# Patient Record
Sex: Male | Born: 1995
Health system: Southern US, Community
[De-identification: ages and names within clinical notes are randomized; demographics above are authoritative.]

## PROBLEM LIST (undated history)

## (undated) DIAGNOSIS — K4090 Unilateral inguinal hernia, without obstruction or gangrene, not specified as recurrent: Secondary | ICD-10-CM

## (undated) DIAGNOSIS — R45851 Suicidal ideations: Secondary | ICD-10-CM

## (undated) DIAGNOSIS — F209 Schizophrenia, unspecified: Secondary | ICD-10-CM

## (undated) DIAGNOSIS — F99 Mental disorder, not otherwise specified: Secondary | ICD-10-CM

## (undated) NOTE — *Deleted (*Deleted)
Pt is alert and oriented to person, place, time and situation. Pt is calm, cooperative, denies suicidal and homicidal ideation.

---
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
## 2017-04-09 ENCOUNTER — Encounter (HOSPITAL_COMMUNITY): Payer: Self-pay | Admitting: *Deleted

## 2017-04-09 DIAGNOSIS — F17200 Nicotine dependence, unspecified, uncomplicated: Secondary | ICD-10-CM | POA: Diagnosis not present

## 2017-04-09 DIAGNOSIS — R109 Unspecified abdominal pain: Secondary | ICD-10-CM | POA: Diagnosis present

## 2017-04-09 DIAGNOSIS — Z5181 Encounter for therapeutic drug level monitoring: Secondary | ICD-10-CM | POA: Insufficient documentation

## 2017-04-09 DIAGNOSIS — K4090 Unilateral inguinal hernia, without obstruction or gangrene, not specified as recurrent: Secondary | ICD-10-CM | POA: Diagnosis not present

## 2017-04-09 DIAGNOSIS — F1210 Cannabis abuse, uncomplicated: Secondary | ICD-10-CM | POA: Diagnosis not present

## 2017-04-09 LAB — COMPREHENSIVE METABOLIC PANEL
ALK PHOS: 73 U/L (ref 38–126)
ALT: 25 U/L (ref 17–63)
AST: 31 U/L (ref 15–41)
Albumin: 4.7 g/dL (ref 3.5–5.0)
Anion gap: 7 (ref 5–15)
BUN: 18 mg/dL (ref 6–20)
CALCIUM: 9.4 mg/dL (ref 8.9–10.3)
CO2: 27 mmol/L (ref 22–32)
CREATININE: 0.88 mg/dL (ref 0.61–1.24)
Chloride: 105 mmol/L (ref 101–111)
Glucose, Bld: 98 mg/dL (ref 65–99)
Potassium: 3.7 mmol/L (ref 3.5–5.1)
Sodium: 139 mmol/L (ref 135–145)
Total Bilirubin: 0.8 mg/dL (ref 0.3–1.2)
Total Protein: 7.8 g/dL (ref 6.5–8.1)

## 2017-04-09 LAB — URINALYSIS, ROUTINE W REFLEX MICROSCOPIC
BILIRUBIN URINE: NEGATIVE
Glucose, UA: NEGATIVE mg/dL
Hgb urine dipstick: NEGATIVE
Ketones, ur: NEGATIVE mg/dL
Nitrite: NEGATIVE
PH: 6 (ref 5.0–8.0)
Protein, ur: 30 mg/dL — AB
SPECIFIC GRAVITY, URINE: 1.024 (ref 1.005–1.030)

## 2017-04-09 LAB — LIPASE, BLOOD: Lipase: 15 U/L (ref 11–51)

## 2017-04-09 LAB — CBC
HCT: 42.6 % (ref 39.0–52.0)
Hemoglobin: 15.1 g/dL (ref 13.0–17.0)
MCH: 28.7 pg (ref 26.0–34.0)
MCHC: 35.4 g/dL (ref 30.0–36.0)
MCV: 80.8 fL (ref 78.0–100.0)
PLATELETS: 238 10*3/uL (ref 150–400)
RBC: 5.27 MIL/uL (ref 4.22–5.81)
RDW: 13.7 % (ref 11.5–15.5)
WBC: 9.8 10*3/uL (ref 4.0–10.5)

## 2017-04-09 NOTE — ED Triage Notes (Signed)
Pt arrives ambulatory via ems with c/o 10/10 lower abdominal pain. VSS en route. Pt state that he has a right inguinal hernia for about 2 years that is now more swollen, painful. He also reports some problems with constipation, "having to strain" when he uses the bathroom. Pt denies n/v/d.

## 2017-04-10 ENCOUNTER — Emergency Department (HOSPITAL_COMMUNITY)
Admission: EM | Admit: 2017-04-10 | Discharge: 2017-04-10 | Disposition: A | Discharge status: Home / Self Care | Payer: Medicaid Other | Attending: Emergency Medicine | Admitting: Emergency Medicine

## 2017-04-10 ENCOUNTER — Encounter (HOSPITAL_COMMUNITY): Payer: Self-pay | Admitting: Emergency Medicine

## 2017-04-10 DIAGNOSIS — K4090 Unilateral inguinal hernia, without obstruction or gangrene, not specified as recurrent: Secondary | ICD-10-CM

## 2017-04-10 DIAGNOSIS — F1210 Cannabis abuse, uncomplicated: Secondary | ICD-10-CM

## 2017-04-10 HISTORY — DX: Unilateral inguinal hernia, without obstruction or gangrene, not specified as recurrent: K40.90

## 2017-04-10 HISTORY — DX: Mental disorder, not otherwise specified: F99

## 2017-04-10 LAB — RAPID URINE DRUG SCREEN, HOSP PERFORMED
Amphetamines: NOT DETECTED
Barbiturates: NOT DETECTED
Benzodiazepines: NOT DETECTED
Cocaine: NOT DETECTED
Opiates: NOT DETECTED
Tetrahydrocannabinol: POSITIVE — AB

## 2017-04-10 NOTE — ED Provider Notes (Signed)
[facility] [place] DEPT Provider Note: Georgena Spurling, MD, FACEP  CSN: [id number] MRN: [id number] ARRIVAL: 04/09/17 at 2142 ROOM: WA14/WA14  By signing my name below, I, Margit Banda, attest that this documentation has been prepared under the direction and in the presence of Shanon Rosser, MD. Electronically Signed: Margit Banda, ED Scribe. 04/10/17. 2:46 AM.  CHIEF COMPLAINT  Abdominal Pain   HISTORY OF PRESENT ILLNESS  Tyler White is a 21 y.o. male with a PMHx of a right inguinal hernia and unspecified psychiatric illness. Pt presents to the Emergency Department c/o abdominal pain that started last night (04/09/17). He cannot state when this pain began. He describes pain as sharp and states it is not that bad right now. Pt has had a hernia for the last two years. He has not seen a Psychologist, sport and exercise for this issue. Pt denies recent nausea or vomiting. He states his hernia "comes and goes" and is presently not palpable.  Past Medical History:  Diagnosis Date  . Inguinal hernia    right  . Psychiatric illness     History reviewed. No pertinent surgical history.  Family History  Problem Relation Age of Onset  . Psychiatric Illness Mother   . Hypertension Sister     Social History  Substance Use Topics  . Smoking status: Current Every Day Smoker  . Smokeless tobacco: Current User  . Alcohol use No    Prior to Admission medications   Not on File    Allergies Patient has no known allergies.   REVIEW OF SYSTEMS  Negative except as noted here or in the History of Present Illness.   PHYSICAL EXAMINATION  Initial Vital Signs Blood pressure (!) 145/89, pulse 62, temperature 98.3 F (36.8 C), temperature source Oral, resp. rate 16, height 5\' 11"  (1.803 m), weight 150 lb (68 kg), SpO2 100 %.  Examination General: Well-developed, well-nourished male in no acute distress; appearance consistent with age of record HENT: normocephalic; atraumatic Eyes: pupils equal, round and  reactive to light; extraocular muscles intact Neck: supple Heart: regular rate and rhythm Lungs: clear to auscultation bilaterally Abdomen: soft; nondistended; nontender; no masses or hepatosplenomegaly; bowel sounds present GU: Tanner 5 male; no hernia palpated in supine and upright positions; no significant inguinal tenderness Extremities: No deformity; full range of motion; pulses normal Neurologic: Awake, alert; motor function intact in all extremities and symmetric; no facial droop Skin: Warm and dry Psychiatric: speech slow; sometimes answers questions with difficulty; flat affect   RESULTS  Summary of this visit's results, reviewed by myself:   EKG Interpretation  Date/Time:    Ventricular Rate:    PR Interval:    QRS Duration:   QT Interval:    QTC Calculation:   R Axis:     Text Interpretation:        Laboratory Studies: Results for orders placed or performed during the hospital encounter of 04/10/17 (from the past 24 hour(s))  Urinalysis, Routine w reflex microscopic     Status: Abnormal   Collection Time: 04/09/17  9:55 PM  Result Value Ref Range   Color, Urine YELLOW YELLOW   APPearance HAZY (A) CLEAR   Specific Gravity, Urine 1.024 1.005 - 1.030   pH 6.0 5.0 - 8.0   Glucose, UA NEGATIVE NEGATIVE mg/dL   Hgb urine dipstick NEGATIVE NEGATIVE   Bilirubin Urine NEGATIVE NEGATIVE   Ketones, ur NEGATIVE NEGATIVE mg/dL   Protein, ur 30 (A) NEGATIVE mg/dL   Nitrite NEGATIVE NEGATIVE   Leukocytes, UA TRACE (A) NEGATIVE  RBC / HPF 0-5 0 - 5 RBC/hpf   WBC, UA 6-30 0 - 5 WBC/hpf   Bacteria, UA RARE (A) NONE SEEN   Squamous Epithelial / LPF 0-5 (A) NONE SEEN   Mucous PRESENT    Sperm, UA PRESENT   Rapid urine drug screen (hospital performed)     Status: Abnormal   Collection Time: 04/09/17 10:03 PM  Result Value Ref Range   Opiates NONE DETECTED NONE DETECTED   Cocaine NONE DETECTED NONE DETECTED   Benzodiazepines NONE DETECTED NONE DETECTED   Amphetamines  NONE DETECTED NONE DETECTED   Tetrahydrocannabinol POSITIVE (A) NONE DETECTED   Barbiturates NONE DETECTED NONE DETECTED  Lipase, blood     Status: None   Collection Time: 04/09/17 10:15 PM  Result Value Ref Range   Lipase 15 11 - 51 U/L  Comprehensive metabolic panel     Status: None   Collection Time: 04/09/17 10:15 PM  Result Value Ref Range   Sodium 139 135 - 145 mmol/L   Potassium 3.7 3.5 - 5.1 mmol/L   Chloride 105 101 - 111 mmol/L   CO2 27 22 - 32 mmol/L   Glucose, Bld 98 65 - 99 mg/dL   BUN 18 6 - 20 mg/dL   Creatinine, Ser 0.88 0.61 - 1.24 mg/dL   Calcium 9.4 8.9 - 10.3 mg/dL   Total Protein 7.8 6.5 - 8.1 g/dL   Albumin 4.7 3.5 - 5.0 g/dL   AST 31 15 - 41 U/L   ALT 25 17 - 63 U/L   Alkaline Phosphatase 73 38 - 126 U/L   Total Bilirubin 0.8 0.3 - 1.2 mg/dL   GFR calc non Af Amer >60 >60 mL/min   GFR calc Af Amer >60 >60 mL/min   Anion gap 7 5 - 15  CBC     Status: None   Collection Time: 04/09/17 10:15 PM  Result Value Ref Range   WBC 9.8 4.0 - 10.5 K/uL   RBC 5.27 4.22 - 5.81 MIL/uL   Hemoglobin 15.1 13.0 - 17.0 g/dL   HCT 42.6 39.0 - 52.0 %   MCV 80.8 78.0 - 100.0 fL   MCH 28.7 26.0 - 34.0 pg   MCHC 35.4 30.0 - 36.0 g/dL   RDW 13.7 11.5 - 15.5 %   Platelets 238 150 - 400 K/uL   Imaging Studies: No results found.  ED COURSE  Nursing notes and initial vitals signs, including pulse oximetry, reviewed.  Vitals:   04/09/17 2148 04/09/17 2150 04/10/17 0137 04/10/17 0340  BP:  (!) 164/102 (!) 145/89 (!) 139/94  Pulse:   62 (!) 54  Resp:   16 18  Temp:      TempSrc:      SpO2:   100% 100%  Weight: 150 lb (68 kg)     Height: 5\' 11"  (1.803 m)       PROCEDURES    ED DIAGNOSES     ICD-9-CM ICD-10-CM   1. Right inguinal hernia 550.90 K40.90    I personally performed the services described in this documentation, which was scribed in my presence. The recorded information has been reviewed and is accurate.     Shanon Rosser, MD 04/10/17 [phone]

## 2017-04-22 ENCOUNTER — Encounter (HOSPITAL_COMMUNITY): Payer: Self-pay | Admitting: Emergency Medicine

## 2017-04-22 DIAGNOSIS — Y929 Unspecified place or not applicable: Secondary | ICD-10-CM | POA: Diagnosis not present

## 2017-04-22 DIAGNOSIS — Z5321 Procedure and treatment not carried out due to patient leaving prior to being seen by health care provider: Secondary | ICD-10-CM | POA: Diagnosis not present

## 2017-04-22 DIAGNOSIS — F17210 Nicotine dependence, cigarettes, uncomplicated: Secondary | ICD-10-CM | POA: Diagnosis not present

## 2017-04-22 DIAGNOSIS — Y999 Unspecified external cause status: Secondary | ICD-10-CM | POA: Insufficient documentation

## 2017-04-22 DIAGNOSIS — Y939 Activity, unspecified: Secondary | ICD-10-CM | POA: Diagnosis not present

## 2017-04-22 DIAGNOSIS — W1830XA Fall on same level, unspecified, initial encounter: Secondary | ICD-10-CM | POA: Diagnosis not present

## 2017-04-22 DIAGNOSIS — Z0000 Encounter for general adult medical examination without abnormal findings: Secondary | ICD-10-CM | POA: Insufficient documentation

## 2017-04-22 NOTE — ED Triage Notes (Signed)
Pt. arrived with EMS from home he lost his balance and fell at home after smoking marijuana this evening , denies LOC /ambulatory , pt. denies pain or discomfort at arrival .

## 2017-04-22 NOTE — ED Notes (Addendum)
Called pt for reassessment no answer RN aware

## 2017-04-23 ENCOUNTER — Emergency Department (HOSPITAL_COMMUNITY)
Admission: EM | Admit: 2017-04-23 | Discharge: 2017-04-23 | Disposition: A | Discharge status: Home / Self Care | Payer: Medicaid Other | Attending: Emergency Medicine | Admitting: Emergency Medicine

## 2017-04-23 NOTE — ED Notes (Signed)
RN went out to get pt. No answer

## 2017-06-10 ENCOUNTER — Emergency Department (HOSPITAL_COMMUNITY): Payer: Medicaid Other

## 2017-06-10 ENCOUNTER — Emergency Department (HOSPITAL_COMMUNITY)
Admission: EM | Admit: 2017-06-10 | Discharge: 2017-06-10 | Payer: Medicaid Other | Attending: Emergency Medicine | Admitting: Emergency Medicine

## 2017-06-10 ENCOUNTER — Encounter (HOSPITAL_COMMUNITY): Payer: Self-pay

## 2017-06-10 DIAGNOSIS — S0990XA Unspecified injury of head, initial encounter: Secondary | ICD-10-CM | POA: Diagnosis not present

## 2017-06-10 DIAGNOSIS — Y939 Activity, unspecified: Secondary | ICD-10-CM | POA: Diagnosis not present

## 2017-06-10 DIAGNOSIS — S39012A Strain of muscle, fascia and tendon of lower back, initial encounter: Secondary | ICD-10-CM | POA: Insufficient documentation

## 2017-06-10 DIAGNOSIS — S9030XA Contusion of unspecified foot, initial encounter: Secondary | ICD-10-CM | POA: Diagnosis not present

## 2017-06-10 DIAGNOSIS — W132XXA Fall from, out of or through roof, initial encounter: Secondary | ICD-10-CM | POA: Insufficient documentation

## 2017-06-10 DIAGNOSIS — W19XXXA Unspecified fall, initial encounter: Secondary | ICD-10-CM

## 2017-06-10 DIAGNOSIS — Z79899 Other long term (current) drug therapy: Secondary | ICD-10-CM | POA: Insufficient documentation

## 2017-06-10 DIAGNOSIS — Y999 Unspecified external cause status: Secondary | ICD-10-CM | POA: Insufficient documentation

## 2017-06-10 DIAGNOSIS — S161XXA Strain of muscle, fascia and tendon at neck level, initial encounter: Secondary | ICD-10-CM | POA: Diagnosis not present

## 2017-06-10 DIAGNOSIS — Y929 Unspecified place or not applicable: Secondary | ICD-10-CM | POA: Diagnosis not present

## 2017-06-10 DIAGNOSIS — S3992XA Unspecified injury of lower back, initial encounter: Secondary | ICD-10-CM | POA: Diagnosis present

## 2017-06-10 DIAGNOSIS — F17210 Nicotine dependence, cigarettes, uncomplicated: Secondary | ICD-10-CM | POA: Insufficient documentation

## 2017-06-10 MED ORDER — IBUPROFEN 400 MG PO TABS: 400.0000 mg | ORAL_TABLET | Freq: Four times a day (QID) | ORAL | 0 refills | Status: DC | PRN

## 2017-06-10 NOTE — ED Notes (Signed)
Bed: WA17 Expected date:  Expected time:  Means of arrival:  Comments: EMS fall 26ftm 21 yo

## 2017-06-10 NOTE — ED Provider Notes (Signed)
[facility] DEPT Provider Note   CSN: [id number] Arrival date & time: 06/10/17  1208     History   Chief Complaint Chief Complaint  Patient presents with  . Fall  . Neck Pain  . Back Pain  . Foot Pain    HPI Tyler White is a 21 y.o. male.  HPI Patient brought in by police. Reportedly fell from Ceiling. Patient states the police detained him on the way down and help break the fall. Complaining of pain in his head neck back. States he had some difficulty walking initially. Reportedly also has pain in his right foot patient states now left foot. No numbness. No skin changes.   Past Medical History:  Diagnosis Date  . Hernia, inguinal, right   . Inguinal hernia    right  . Psychiatric illness     There are no active problems to display for this patient.   History reviewed. No pertinent surgical history.     Home Medications    Prior to Admission medications   Medication Sig Start Date End Date Taking? Authorizing Provider  HYDROcodone-acetaminophen (NORCO/VICODIN) 5-325 MG tablet Take 1 tablet by mouth every 6 (six) hours as needed. Patient not taking: Reported on 06/10/2017 06/12/16   Ripley Fraise, MD  ibuprofen (ADVIL,MOTRIN) 400 MG tablet Take 1 tablet (400 mg total) by mouth every 6 (six) hours as needed. 06/10/17   [name], MD    Family History Family History  Problem Relation Age of Onset  . Psychiatric Illness Mother   . Hypertension Sister     Social History Social History  Substance Use Topics  . Smoking status: Current Every Day Smoker    Packs/day: 0.50    Years: 5.00    Types: Cigarettes  . Smokeless tobacco: Never Used  . Alcohol use No     Allergies   Patient has no known allergies.   Review of Systems Review of Systems  Constitutional: Negative for appetite change.  HENT: Negative for congestion.   Respiratory: Negative for shortness of breath.   Cardiovascular: Negative for leg swelling.  Gastrointestinal:  Negative for abdominal pain.  Genitourinary: Negative for flank pain.  Musculoskeletal: Positive for back pain and neck pain. Negative for joint swelling.  Neurological: Positive for headaches.  Hematological: Negative for adenopathy.  Psychiatric/Behavioral: Negative for confusion.     Physical Exam Updated Vital Signs BP 140/88 (BP Location: Right Arm)   Pulse 99   Temp 98.2 F (36.8 C) (Oral)   Resp 18   Ht 5\' 10"  (1.778 m)   Wt 68 kg (150 lb)   SpO2 100%   BMI 21.52 kg/m   Physical Exam  Constitutional: He appears well-developed and well-nourished.  HENT:  Mild tenderness in occipital area.  Eyes: EOM are normal.  Neck:  Cervical collar in place. Some tenderness over upper cervical spine. No deformity.   Cardiovascular: Normal rate.   Pulmonary/Chest: Effort normal. He exhibits no tenderness.  Abdominal: Soft. There is no tenderness.  Musculoskeletal: He exhibits tenderness.  Mild tenderness over anterior feet bilaterally. No deformity. No ankle tenderness. No bony tenderness. Good range of motion hips. Good straight leg raise and strength in bilateral lower extremities. Some tenderness over lumbar and thoracic spine without deformity.  Neurological: He is alert.  Skin: Skin is warm. Capillary refill takes less than 2 seconds.     ED Treatments / Results  Labs (all labs ordered are listed, but only abnormal results are displayed) Labs Reviewed - No data to  display  EKG  EKG Interpretation None       Radiology Dg Thoracic Spine 2 View  Result Date: 06/10/2017 CLINICAL DATA:  Patient was escorted by police and handcuffed. Fell through a roof. Did not fall to bottom and was hanging and had to be assisted to the ground. Patient will not talk and no visible injury. EXAM: THORACIC SPINE 2 VIEWS COMPARISON:  Chest radiograph, 11/24/2013 FINDINGS: There is no evidence of thoracic spine fracture. Alignment is normal. No other significant bone abnormalities are  identified. IMPRESSION: Negative. Electronically Signed   By: Lajean Manes M.D.   On: 06/10/2017 13:51   Dg Lumbar Spine Complete  Result Date: 06/10/2017 CLINICAL DATA:  Patient was escorted by police and handcuffed. Fell through a roof. Did not fall to bottom and was hanging and had to be assisted to the ground. Patient will not talk and no visible injury. EXAM: LUMBAR SPINE - COMPLETE 4+ VIEW COMPARISON:  CT, 02/18/2014 FINDINGS: There is no evidence of lumbar spine fracture. Alignment is normal. Intervertebral disc spaces are maintained. IMPRESSION: Negative. Electronically Signed   By: Lajean Manes M.D.   On: 06/10/2017 13:52   Ct Head Wo Contrast  Result Date: 06/10/2017 CLINICAL DATA:  Patient fell 10 feet through attic floor. EXAM: CT HEAD WITHOUT CONTRAST CT CERVICAL SPINE WITHOUT CONTRAST TECHNIQUE: Multidetector CT imaging of the head and cervical spine was performed following the standard protocol without intravenous contrast. Multiplanar CT image reconstructions of the cervical spine were also generated. COMPARISON:  None. FINDINGS: CT HEAD FINDINGS Brain: No evidence of acute infarction, hemorrhage, hydrocephalus, extra-axial collection or mass lesion/mass effect. Vascular: No hyperdense vessel or unexpected calcification. Skull: Normal. Negative for fracture or focal lesion. Sinuses/Orbits: No acute finding. Other: Mild head tilt accounting for slight asymmetric appearance of the intracranial contents. CT CERVICAL SPINE FINDINGS Alignment: Normal. Skull base and vertebrae: No acute fracture. No primary bone lesion or focal pathologic process. Soft tissues and spinal canal: No prevertebral fluid or swelling. No visible canal hematoma. Disc levels: Imaging of the discs performed from C2 through T2 demonstrate no focal disc herniation, canal stenosis or significant neural foraminal encroachment. No jumped facets. Uncovertebral joints are unremarkable. Upper chest: Clear.  No pneumothorax.  Other: None IMPRESSION: 1. No acute intracranial abnormality.  No skull fracture. 2. No acute posttraumatic cervical spine fracture or subluxation. Electronically Signed   By: Ashley Royalty M.D.   On: 06/10/2017 13:19   Ct Cervical Spine Wo Contrast  Result Date: 06/10/2017 CLINICAL DATA:  Patient fell 10 feet through attic floor. EXAM: CT HEAD WITHOUT CONTRAST CT CERVICAL SPINE WITHOUT CONTRAST TECHNIQUE: Multidetector CT imaging of the head and cervical spine was performed following the standard protocol without intravenous contrast. Multiplanar CT image reconstructions of the cervical spine were also generated. COMPARISON:  None. FINDINGS: CT HEAD FINDINGS Brain: No evidence of acute infarction, hemorrhage, hydrocephalus, extra-axial collection or mass lesion/mass effect. Vascular: No hyperdense vessel or unexpected calcification. Skull: Normal. Negative for fracture or focal lesion. Sinuses/Orbits: No acute finding. Other: Mild head tilt accounting for slight asymmetric appearance of the intracranial contents. CT CERVICAL SPINE FINDINGS Alignment: Normal. Skull base and vertebrae: No acute fracture. No primary bone lesion or focal pathologic process. Soft tissues and spinal canal: No prevertebral fluid or swelling. No visible canal hematoma. Disc levels: Imaging of the discs performed from C2 through T2 demonstrate no focal disc herniation, canal stenosis or significant neural foraminal encroachment. No jumped facets. Uncovertebral joints are unremarkable. Upper chest:  Clear.  No pneumothorax. Other: None IMPRESSION: 1. No acute intracranial abnormality.  No skull fracture. 2. No acute posttraumatic cervical spine fracture or subluxation. Electronically Signed   By: Ashley Royalty M.D.   On: 06/10/2017 13:19   Dg Foot Complete Left  Result Date: 06/10/2017 CLINICAL DATA:  Patient was escorted by police and handcuffed. Fell through a roof. Did not fall to bottom and was hanging and had to be assisted to the  ground. Patient will not talk and no visible injury. EXAM: LEFT FOOT - COMPLETE 3+ VIEW COMPARISON:  None. FINDINGS: There is no evidence of fracture or dislocation. There is no evidence of arthropathy or other focal bone abnormality. Soft tissues are unremarkable. IMPRESSION: Negative. Electronically Signed   By: Lajean Manes M.D.   On: 06/10/2017 13:52   Dg Foot Complete Right  Result Date: 06/10/2017 CLINICAL DATA:  Patient was escorted by police and handcuffed. Fell through a roof. Did not fall to bottom and was hanging and had to be assisted to the ground. Patient will not talk and no visible injury. EXAM: RIGHT FOOT COMPLETE - 3+ VIEW COMPARISON:  06/11/2016 FINDINGS: No acute fracture. Old fracture at the base of the fifth metatarsal, which was acute on the prior study. The joints normally spaced and aligned. Soft tissues are unremarkable. IMPRESSION: No fracture, dislocation or acute finding. Electronically Signed   By: Lajean Manes M.D.   On: 06/10/2017 13:54    Procedures Procedures (including critical care time)  Medications Ordered in ED Medications - No data to display   Initial Impression / Assessment and Plan / ED Course  I have reviewed the triage vital signs and the nursing notes.  Pertinent labs & imaging results that were available during my care of the patient were reviewed by me and considered in my medical decision making (see chart for details).     Patient with fall. Head neck back and bilateral foot pain. X-rays and CT scans reassuring. Cervical collar removed. Will discharge home with ibuprofen as needed. Discharge into police custody.  Final Clinical Impressions(s) / ED Diagnoses   Final diagnoses:  Fall, initial encounter  Strain of lumbar region, initial encounter  Strain of neck muscle, initial encounter  Contusion of foot, unspecified laterality, initial encounter    New Prescriptions New Prescriptions   IBUPROFEN (ADVIL,MOTRIN) 400 MG TABLET    Take  1 tablet (400 mg total) by mouth every 6 (six) hours as needed.     [name], MD 06/10/17 [phone]

## 2017-06-10 NOTE — ED Triage Notes (Signed)
Per EMS- Patient fell from the attic through the ceiling fhat was approx 10 feet. Patient c/o neck and upper back pain. c-collar applied. Patient also c/o right foot pain. Patient arrived accompanied by [employer] x 2 and wearing handcuffs. Patient is currently handcuffed to the bed.

## 2017-07-09 ENCOUNTER — Encounter (HOSPITAL_COMMUNITY): Payer: Self-pay | Admitting: Emergency Medicine

## 2017-07-09 ENCOUNTER — Emergency Department (HOSPITAL_COMMUNITY)
Admission: EM | Admit: 2017-07-09 | Discharge: 2017-07-09 | Disposition: A | Discharge status: Home / Self Care | Payer: Medicaid Other | Attending: Physician Assistant | Admitting: Physician Assistant

## 2017-07-09 DIAGNOSIS — R55 Syncope and collapse: Secondary | ICD-10-CM | POA: Diagnosis not present

## 2017-07-09 DIAGNOSIS — E860 Dehydration: Secondary | ICD-10-CM | POA: Diagnosis not present

## 2017-07-09 DIAGNOSIS — R569 Unspecified convulsions: Secondary | ICD-10-CM | POA: Diagnosis present

## 2017-07-09 DIAGNOSIS — F17210 Nicotine dependence, cigarettes, uncomplicated: Secondary | ICD-10-CM | POA: Insufficient documentation

## 2017-07-09 LAB — URINALYSIS, ROUTINE W REFLEX MICROSCOPIC
BILIRUBIN URINE: NEGATIVE
GLUCOSE, UA: NEGATIVE mg/dL
HGB URINE DIPSTICK: NEGATIVE
KETONES UR: NEGATIVE mg/dL
NITRITE: NEGATIVE
PH: 6 (ref 5.0–8.0)
PROTEIN: 30 mg/dL — AB
Specific Gravity, Urine: 1.028 (ref 1.005–1.030)

## 2017-07-09 LAB — COMPREHENSIVE METABOLIC PANEL
ALT: 46 U/L (ref 17–63)
AST: 37 U/L (ref 15–41)
Albumin: 4.2 g/dL (ref 3.5–5.0)
Alkaline Phosphatase: 58 U/L (ref 38–126)
Anion gap: 8 (ref 5–15)
BUN: 23 mg/dL — ABNORMAL HIGH (ref 6–20)
CHLORIDE: 105 mmol/L (ref 101–111)
CO2: 23 mmol/L (ref 22–32)
CREATININE: 0.93 mg/dL (ref 0.61–1.24)
Calcium: 9.7 mg/dL (ref 8.9–10.3)
GFR calc non Af Amer: >60 mL/min (ref >60)
Glucose, Bld: 93 mg/dL (ref 65–99)
POTASSIUM: 4.3 mmol/L (ref 3.5–5.1)
SODIUM: 136 mmol/L (ref 135–145)
Total Bilirubin: 0.7 mg/dL (ref 0.3–1.2)
Total Protein: 6.8 g/dL (ref 6.5–8.1)

## 2017-07-09 LAB — CBC WITH DIFFERENTIAL/PLATELET
BASOS ABS: 0 10*3/uL (ref 0.0–0.1)
Basophils Relative: 0 %
EOS ABS: 0.1 10*3/uL (ref 0.0–0.7)
EOS PCT: 1 %
HCT: 42.4 % (ref 39.0–52.0)
Hemoglobin: 14.7 g/dL (ref 13.0–17.0)
Lymphocytes Relative: 12 %
Lymphs Abs: 1.2 10*3/uL (ref 0.7–4.0)
MCH: 27.7 pg (ref 26.0–34.0)
MCHC: 34.7 g/dL (ref 30.0–36.0)
MCV: 80 fL (ref 78.0–100.0)
MONO ABS: 0.5 10*3/uL (ref 0.1–1.0)
Monocytes Relative: 5 %
Neutro Abs: 8.4 10*3/uL — ABNORMAL HIGH (ref 1.7–7.7)
Neutrophils Relative %: 82 %
PLATELETS: 211 10*3/uL (ref 150–400)
RBC: 5.3 MIL/uL (ref 4.22–5.81)
RDW: 13.3 % (ref 11.5–15.5)
WBC: 10.2 10*3/uL (ref 4.0–10.5)

## 2017-07-09 MED ORDER — SODIUM CHLORIDE 0.9 % IV BOLUS (SEPSIS)
2000.0000 mL | Freq: Once | INTRAVENOUS | Status: AC
  Administered 2017-07-09: 2000 mL via INTRAVENOUS

## 2017-07-09 NOTE — Discharge Instructions (Signed)
Please follow with your primary care doctor in the next 2 days for a check-up. They must obtain records for further management.  ° °Do not hesitate to return to the Emergency Department for any new, worsening or concerning symptoms.  ° °

## 2017-07-09 NOTE — ED Notes (Signed)
Pt pulled out IV, Pisciotta notified. She stated pt can leave without completing entire fluids (around 750 ml's NS infused)

## 2017-07-09 NOTE — ED Provider Notes (Signed)
[facility] DEPT Provider Note   CSN: [id number] Arrival date & time: 07/09/17  1042     History   Chief Complaint Chief Complaint  Patient presents with  . Seizures    HPI   Blood pressure (!) 96/52, pulse 88, temperature 98.6 F (37 C), temperature source Oral, resp. rate 18, SpO2 94 %.  Tyler White is a 21 y.o. male with no significant past medical history brought in by EMS for possible seizure-like activity. The patient was at the [facility] with his mother he was outside and his mother was inside a person shouted that somebody was having a seizure and her son walked inside. He seemed very unsteady on his feet and he had not warmth look on his face. He never lost consciousness, there was no tonic-clonic movement, there was no incontinence or tongue biting.  Past Medical History:  Diagnosis Date  . Hernia, inguinal, right   . Inguinal hernia    right  . Psychiatric illness     There are no active problems to display for this patient.   History reviewed. No pertinent surgical history.     Home Medications    Prior to Admission medications   Medication Sig Start Date End Date Taking? Authorizing Provider  HYDROcodone-acetaminophen (NORCO/VICODIN) 5-325 MG tablet Take 1 tablet by mouth every 6 (six) hours as needed. Patient not taking: Reported on 06/10/2017 06/12/16   Ripley Fraise, MD  ibuprofen (ADVIL,MOTRIN) 400 MG tablet Take 1 tablet (400 mg total) by mouth every 6 (six) hours as needed. 06/10/17   [name], MD    Family History Family History  Problem Relation Age of Onset  . Psychiatric Illness Mother   . Hypertension Sister     Social History Social History  Substance Use Topics  . Smoking status: Current Every Day Smoker    Packs/day: 0.50    Years: 5.00    Types: Cigarettes  . Smokeless tobacco: Never Used  . Alcohol use No     Allergies   Patient has no known allergies.   Review of Systems Review of Systems  A complete  review of systems was obtained and all systems are negative except as noted in the HPI and PMH.   Physical Exam Updated Vital Signs BP 113/64   Pulse 80   Temp 98.6 F (37 C) (Oral)   Resp 19   SpO2 98%   Physical Exam  Constitutional: He is oriented to person, place, and time. He appears well-developed and well-nourished. No distress.  HENT:  Head: Normocephalic and atraumatic.  Mouth/Throat: Oropharynx is clear and moist.  Eyes: Conjunctivae and EOM are normal. Pupils are equal, round, and reactive to light.  Neck: Normal range of motion.  Cardiovascular: Normal rate, regular rhythm and intact distal pulses.   Pulmonary/Chest: Effort normal and breath sounds normal.  Abdominal: Soft. There is no tenderness.  Musculoskeletal: Normal range of motion.  Neurological: He is alert and oriented to person, place, and time.  Minimally conversant, as per mother this is his baseline  Skin: He is not diaphoretic.  Psychiatric: He has a normal mood and affect.  Nursing note and vitals reviewed.    ED Treatments / Results  Labs (all labs ordered are listed, but only abnormal results are displayed) Labs Reviewed  CBC WITH DIFFERENTIAL/PLATELET - Abnormal; Notable for the following:       Result Value   Neutro Abs 8.4 (*)    All other components within normal limits  COMPREHENSIVE METABOLIC PANEL -  Abnormal; Notable for the following:    BUN 23 (*)    All other components within normal limits  URINALYSIS, ROUTINE W REFLEX MICROSCOPIC - Abnormal; Notable for the following:    APPearance HAZY (*)    Protein, ur 30 (*)    Leukocytes, UA TRACE (*)    Bacteria, UA RARE (*)    Squamous Epithelial / LPF 0-5 (*)    All other components within normal limits    EKG  EKG Interpretation None       Radiology No results found.  Procedures Procedures (including critical care time)  Medications Ordered in ED Medications  sodium chloride 0.9 % bolus 2,000 mL (2,000 mLs Intravenous  New Bag/Given 07/09/17 1423)     Initial Impression / Assessment and Plan / ED Course  I have reviewed the triage vital signs and the nursing notes.  Pertinent labs & imaging results that were available during my care of the patient were reviewed by me and considered in my medical decision making (see chart for details).     Vitals:   07/09/17 1330 07/09/17 1345 07/09/17 1400 07/09/17 1415  BP: 107/66 (!) 113/58 104/69 113/64  Pulse: 70 69 78 80  Resp: 15 15 18 19   Temp:      TempSrc:      SpO2: 98% 96% 96% 98%    Medications  sodium chloride 0.9 % bolus 2,000 mL (2,000 mLs Intravenous New Bag/Given 07/09/17 1423)    Tyler White is 21 y.o. male presenting with what was originally described as seizure however that his symptoms appear more consistent with presyncope, he did not lose consciousness. EKG with no arrhythmia. Blood work reassuring, his BUN is elevated consistent with dehydration, orthostatic vital signs are significantly abnormal. He will be given 2 L and discharged home with strict return precautions and to stay out of the heat and to perform aggressive hydration  Evaluation does not show pathology that would require ongoing emergent intervention or inpatient treatment. Pt is hemodynamically stable and mentating appropriately. Discussed findings and plan with patient/guardian, who agrees with care plan. All questions answered. Return precautions discussed and outpatient follow up given.      Final Clinical Impressions(s) / ED Diagnoses   Final diagnoses:  Dehydration  Pre-syncope    New Prescriptions New Prescriptions   No medications on file     Waynetta Pean 07/09/17 1423    Macarthur Critchley, MD 07/11/17 2235

## 2017-07-09 NOTE — ED Triage Notes (Signed)
Pt in from [facility] via [facility] after reported 1.5 min seizure. No incontinence or mouth trauma noted, no hx of seizure. Awake, a&ox4, denies drug use for EMS. BP 116/62

## 2017-11-25 ENCOUNTER — Emergency Department (HOSPITAL_COMMUNITY)
Admission: EM | Admit: 2017-11-25 | Discharge: 2017-11-26 | Disposition: A | Discharge status: Other Acute Inpatient Hospital | Payer: Medicaid Other | Attending: Emergency Medicine | Admitting: Emergency Medicine

## 2017-11-25 ENCOUNTER — Encounter (HOSPITAL_COMMUNITY): Payer: Self-pay | Admitting: Emergency Medicine

## 2017-11-25 DIAGNOSIS — R440 Auditory hallucinations: Secondary | ICD-10-CM | POA: Insufficient documentation

## 2017-11-25 DIAGNOSIS — R443 Hallucinations, unspecified: Secondary | ICD-10-CM | POA: Diagnosis not present

## 2017-11-25 DIAGNOSIS — Z008 Encounter for other general examination: Secondary | ICD-10-CM

## 2017-11-25 DIAGNOSIS — F17210 Nicotine dependence, cigarettes, uncomplicated: Secondary | ICD-10-CM | POA: Diagnosis not present

## 2017-11-25 DIAGNOSIS — F12150 Cannabis abuse with psychotic disorder with delusions: Secondary | ICD-10-CM | POA: Diagnosis present

## 2017-11-25 DIAGNOSIS — Z789 Other specified health status: Secondary | ICD-10-CM

## 2017-11-25 DIAGNOSIS — R45851 Suicidal ideations: Secondary | ICD-10-CM | POA: Insufficient documentation

## 2017-11-25 DIAGNOSIS — R4689 Other symptoms and signs involving appearance and behavior: Secondary | ICD-10-CM | POA: Insufficient documentation

## 2017-11-25 DIAGNOSIS — F323 Major depressive disorder, single episode, severe with psychotic features: Secondary | ICD-10-CM | POA: Diagnosis not present

## 2017-11-25 DIAGNOSIS — Z046 Encounter for general psychiatric examination, requested by authority: Secondary | ICD-10-CM

## 2017-11-25 DIAGNOSIS — F329 Major depressive disorder, single episode, unspecified: Secondary | ICD-10-CM | POA: Diagnosis present

## 2017-11-25 DIAGNOSIS — Z7289 Other problems related to lifestyle: Secondary | ICD-10-CM

## 2017-11-25 LAB — COMPREHENSIVE METABOLIC PANEL
ALBUMIN: 4.2 g/dL (ref 3.5–5.0)
ALK PHOS: 72 U/L (ref 38–126)
ALT: 36 U/L (ref 17–63)
ANION GAP: 7 (ref 5–15)
AST: 43 U/L — AB (ref 15–41)
BILIRUBIN TOTAL: 0.7 mg/dL (ref 0.3–1.2)
BUN: 17 mg/dL (ref 6–20)
CALCIUM: 9.3 mg/dL (ref 8.9–10.3)
CO2: 24 mmol/L (ref 22–32)
CREATININE: 0.77 mg/dL (ref 0.61–1.24)
Chloride: 105 mmol/L (ref 101–111)
GFR calc Af Amer: >60 mL/min (ref >60)
GFR calc non Af Amer: >60 mL/min (ref >60)
Glucose, Bld: 95 mg/dL (ref 65–99)
Potassium: 3.8 mmol/L (ref 3.5–5.1)
Sodium: 136 mmol/L (ref 135–145)
TOTAL PROTEIN: 7.1 g/dL (ref 6.5–8.1)

## 2017-11-25 LAB — CBC
HEMATOCRIT: 42.9 % (ref 39.0–52.0)
Hemoglobin: 14.7 g/dL (ref 13.0–17.0)
MCH: 28.4 pg (ref 26.0–34.0)
MCHC: 34.3 g/dL (ref 30.0–36.0)
MCV: 83 fL (ref 78.0–100.0)
Platelets: 288 10*3/uL (ref 150–400)
RBC: 5.17 MIL/uL (ref 4.22–5.81)
RDW: 14.3 % (ref 11.5–15.5)
WBC: 6.4 10*3/uL (ref 4.0–10.5)

## 2017-11-25 LAB — ETHANOL: Alcohol, Ethyl (B): <10 mg/dL (ref <10)

## 2017-11-25 LAB — SALICYLATE LEVEL: Salicylate Lvl: <7 mg/dL (ref 2.8–30.0)

## 2017-11-25 LAB — ACETAMINOPHEN LEVEL: Acetaminophen (Tylenol), Serum: <10 ug/mL — ABNORMAL LOW (ref 10–30)

## 2017-11-25 MED ORDER — ACETAMINOPHEN 325 MG PO TABS: 650.0000 mg | ORAL_TABLET | ORAL | Status: DC | PRN

## 2017-11-25 MED ORDER — ZOLPIDEM TARTRATE 5 MG PO TABS: 5.0000 mg | ORAL_TABLET | Freq: Every evening | ORAL | Status: DC | PRN

## 2017-11-25 MED ORDER — ALUM & MAG HYDROXIDE-SIMETH 200-200-20 MG/5ML PO SUSP: 30.0000 mL | Freq: Four times a day (QID) | ORAL | Status: DC | PRN

## 2017-11-25 MED ORDER — ONDANSETRON HCL 4 MG PO TABS: 4.0000 mg | ORAL_TABLET | Freq: Three times a day (TID) | ORAL | Status: DC | PRN
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
## 2017-11-25 NOTE — ED Triage Notes (Signed)
Patient brought in by [employer] he is IVC'd by his family for suicidal thoughts but no plan and hallucinations with bizarre behavior.  Patient arrived to unit calm and cooperative.

## 2017-11-25 NOTE — ED Notes (Signed)
Pt oriented to room and unit.  Pt appears withdrawn and paranoid.  Pt denies S/I and H/I.  Patient does admit to hearing and seeing things but does not elaborate on what.  Pt is very soft spoken.  15 minute checks and video monitoring in place.

## 2017-11-25 NOTE — Care Management (Signed)
Writer faxed patient to [place], Rosana Hoes, Mikel Cella, [place], [facility], [place], [place], [place], Teacher, music,
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
## 2017-11-25 NOTE — ED Notes (Signed)
Bed: WA29 Expected date:  Expected time:  Means of arrival:  Comments: [employer]-IVC-hostile

## 2017-11-25 NOTE — ED Notes (Signed)
Pt resting at present, no distress noted, calm & cooperative.  Monitoring for safety, Q 15 min checks in effect. 
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
## 2017-11-26 ENCOUNTER — Other Ambulatory Visit: Payer: Self-pay

## 2017-11-26 ENCOUNTER — Encounter (HOSPITAL_COMMUNITY): Payer: Self-pay | Admitting: *Deleted

## 2017-11-26 ENCOUNTER — Inpatient Hospital Stay (HOSPITAL_COMMUNITY)
Admission: AD | Admit: 2017-11-26 | Discharge: 2017-12-02 | DRG: 885 | Disposition: A | Discharge status: Home / Self Care | Payer: Medicaid Other | Source: Intra-hospital | Attending: Psychiatry | Admitting: Psychiatry

## 2017-11-26 DIAGNOSIS — R45851 Suicidal ideations: Secondary | ICD-10-CM | POA: Diagnosis not present

## 2017-11-26 DIAGNOSIS — F12159 Cannabis abuse with psychotic disorder, unspecified: Secondary | ICD-10-CM | POA: Diagnosis present

## 2017-11-26 DIAGNOSIS — F12150 Cannabis abuse with psychotic disorder with delusions: Secondary | ICD-10-CM | POA: Diagnosis not present

## 2017-11-26 DIAGNOSIS — R443 Hallucinations, unspecified: Secondary | ICD-10-CM

## 2017-11-26 DIAGNOSIS — F333 Major depressive disorder, recurrent, severe with psychotic symptoms: Principal | ICD-10-CM | POA: Diagnosis present

## 2017-11-26 DIAGNOSIS — R4689 Other symptoms and signs involving appearance and behavior: Secondary | ICD-10-CM | POA: Diagnosis not present

## 2017-11-26 DIAGNOSIS — F209 Schizophrenia, unspecified: Secondary | ICD-10-CM | POA: Insufficient documentation

## 2017-11-26 DIAGNOSIS — R450 Nervousness: Secondary | ICD-10-CM | POA: Diagnosis not present

## 2017-11-26 DIAGNOSIS — R440 Auditory hallucinations: Secondary | ICD-10-CM | POA: Diagnosis not present

## 2017-11-26 DIAGNOSIS — F29 Unspecified psychosis not due to a substance or known physiological condition: Secondary | ICD-10-CM | POA: Diagnosis not present

## 2017-11-26 DIAGNOSIS — Z818 Family history of other mental and behavioral disorders: Secondary | ICD-10-CM | POA: Diagnosis not present

## 2017-11-26 DIAGNOSIS — F17210 Nicotine dependence, cigarettes, uncomplicated: Secondary | ICD-10-CM | POA: Diagnosis present

## 2017-11-26 DIAGNOSIS — F329 Major depressive disorder, single episode, unspecified: Secondary | ICD-10-CM | POA: Diagnosis present

## 2017-11-26 DIAGNOSIS — F1910 Other psychoactive substance abuse, uncomplicated: Secondary | ICD-10-CM | POA: Diagnosis not present

## 2017-11-26 DIAGNOSIS — F419 Anxiety disorder, unspecified: Secondary | ICD-10-CM | POA: Diagnosis not present

## 2017-11-26 DIAGNOSIS — Z8249 Family history of ischemic heart disease and other diseases of the circulatory system: Secondary | ICD-10-CM | POA: Diagnosis not present

## 2017-11-26 DIAGNOSIS — Z915 Personal history of self-harm: Secondary | ICD-10-CM | POA: Diagnosis not present

## 2017-11-26 DIAGNOSIS — F332 Major depressive disorder, recurrent severe without psychotic features: Secondary | ICD-10-CM | POA: Diagnosis not present

## 2017-11-26 DIAGNOSIS — G4700 Insomnia, unspecified: Secondary | ICD-10-CM | POA: Diagnosis not present

## 2017-11-26 DIAGNOSIS — F323 Major depressive disorder, single episode, severe with psychotic features: Secondary | ICD-10-CM

## 2017-11-26 DIAGNOSIS — R451 Restlessness and agitation: Secondary | ICD-10-CM | POA: Diagnosis not present

## 2017-11-26 LAB — RAPID URINE DRUG SCREEN, HOSP PERFORMED
Amphetamines: NOT DETECTED
BARBITURATES: NOT DETECTED
Benzodiazepines: NOT DETECTED
COCAINE: NOT DETECTED
OPIATES: NOT DETECTED
Tetrahydrocannabinol: POSITIVE — AB

## 2017-11-26 MED ORDER — TRAZODONE HCL 50 MG PO TABS: 50.0000 mg | ORAL_TABLET | Freq: Every evening | ORAL | Status: DC | PRN

## 2017-11-26 MED ORDER — ALUM & MAG HYDROXIDE-SIMETH 200-200-20 MG/5ML PO SUSP: 30.0000 mL | ORAL | Status: DC | PRN

## 2017-11-26 MED ORDER — ACETAMINOPHEN 325 MG PO TABS
650.0000 mg | ORAL_TABLET | Freq: Four times a day (QID) | ORAL | Status: DC | PRN
  Administered 2017-11-28: 650 mg via ORAL
  Filled 2017-11-26: qty 2

## 2017-11-26 MED ORDER — GABAPENTIN 100 MG PO CAPS
200.0000 mg | ORAL_CAPSULE | Freq: Two times a day (BID) | ORAL | Status: DC
  Administered 2017-11-26: 200 mg via ORAL
  Filled 2017-11-26: qty 2

## 2017-11-26 MED ORDER — HALOPERIDOL 1 MG PO TABS
1.0000 mg | ORAL_TABLET | Freq: Two times a day (BID) | ORAL | Status: DC
  Administered 2017-11-26: 1 mg via ORAL
  Filled 2017-11-26: qty 1

## 2017-11-26 MED ORDER — GABAPENTIN 100 MG PO CAPS
200.0000 mg | ORAL_CAPSULE | Freq: Two times a day (BID) | ORAL | Status: DC
  Administered 2017-11-26 – 2017-12-02 (×12): 200 mg via ORAL
  Filled 2017-11-26 (×17): qty 2

## 2017-11-26 MED ORDER — NICOTINE 21 MG/24HR TD PT24
21.0000 mg | MEDICATED_PATCH | Freq: Every day | TRANSDERMAL | Status: DC
  Administered 2017-11-27 – 2017-12-01 (×4): 21 mg via TRANSDERMAL
  Filled 2017-11-26 (×8): qty 1

## 2017-11-26 MED ORDER — TRAZODONE HCL 50 MG PO TABS
50.0000 mg | ORAL_TABLET | Freq: Every evening | ORAL | Status: DC | PRN
  Administered 2017-11-26 – 2017-12-01 (×4): 50 mg via ORAL
  Filled 2017-11-26 (×4): qty 1

## 2017-11-26 MED ORDER — MAGNESIUM HYDROXIDE 400 MG/5ML PO SUSP: 30.0000 mL | Freq: Every day | ORAL | Status: DC | PRN

## 2017-11-26 MED ORDER — HALOPERIDOL 1 MG PO TABS
1.0000 mg | ORAL_TABLET | Freq: Two times a day (BID) | ORAL | Status: DC
  Administered 2017-11-26 – 2017-11-27 (×2): 1 mg via ORAL
  Filled 2017-11-26 (×5): qty 1
  Filled 2017-11-26: qty 2
  Filled 2017-11-26: qty 1

## 2017-11-26 MED ORDER — HYDROXYZINE HCL 25 MG PO TABS
25.0000 mg | ORAL_TABLET | Freq: Three times a day (TID) | ORAL | Status: DC | PRN
  Administered 2017-11-29 – 2017-12-01 (×6): 25 mg via ORAL
  Filled 2017-11-26 (×6): qty 1

## 2017-11-26 NOTE — ED Notes (Signed)
Report to RN [place], [facility]..  [employer] transport requested.

## 2017-11-26 NOTE — ED Notes (Signed)
Pt A&O x 3, no distress noted, calm & cooperative at present.  Monitoring for safety, Q 15 min checks in effect.  Pending report & [facility] transfer to [facility].
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
## 2017-11-26 NOTE — BH Assessment (Signed)
[place] Assessment Progress Note  Per Corena Pilgrim, MD, this pt requires psychiatric hospitalization.  Leonia Reader, RN, [facility] has assigned pt to [facility] Rm [id number]; they will call when they are ready to receive pt.  Pt presents under IVC initiated by pt's mother, and upheld by Dr Darleene Cleaver, and IVC documents have been faxed to [facility].  Pt's nurse, Otho Perl, has been notified, and agrees to call report to [phone].  Pt is to be transported via Event organiser.   Jalene Mullet, [place] Triage Specialist [phone]

## 2017-11-26 NOTE — ED Notes (Signed)
Urine cup given

## 2017-11-26 NOTE — Progress Notes (Signed)
11/26/17 1359:  LRT went to pt room to offer activities, pt was sleep.   Victorino Sparrow, LRT/CTRS

## 2017-11-26 NOTE — ED Notes (Signed)
Attempted to cheek scheduled medications.  Did eventually swallow meds.  Informed that he had the right to refuse medications.  Med education done.

## 2017-11-26 NOTE — ED Notes (Signed)
Returned ankle bracelet charging cord to locker #34. Patient is calm and cooperative.

## 2017-11-26 NOTE — Progress Notes (Addendum)
Tyler White is a 21 year old male pt admitted on involuntary basis. On admission, he is cooperative but appears paranoid and guarded and constantly looks around the room. He denied any SI and reports that he has never mentioned being suicidal. He denied hearing or seeing things but does appear to be responding during the admission process. He does endorse marijuana usage and reports that he drinks a couple times a week. He reports that he is not on any current medication and reports that he lives with his mother and reports he will go back there after discharge. He was oriented to the milieu and safety maintained. He does have ankle bracelet on and reports that he needs to charge it every evening. Power cord was given to [employer] B receiving Marine scientist.

## 2017-11-26 NOTE — Tx Team (Signed)
Initial Treatment Plan 11/26/2017 9:24 PM [name] BBU:[id number]    PATIENT STRESSORS: Legal issue Marital or family conflict   PATIENT STRENGTHS: Average or above average intelligence Capable of independent living General fund of knowledge   PATIENT IDENTIFIED PROBLEMS: Psychosis "I go through a lot of pain" "I need help with my pain"                     DISCHARGE CRITERIA:  Ability to meet basic life and health needs Improved stabilization in mood, thinking, and/or behavior Verbal commitment to aftercare and medication compliance  PRELIMINARY DISCHARGE PLAN: Attend aftercare/continuing care group Return to previous living arrangement  PATIENT/FAMILY INVOLVEMENT: This treatment plan has been presented to and reviewed with the patient, Tyler White, and/or family member, .  The patient and family have been given the opportunity to ask questions and make suggestions.  [place], [place], [place] 11/26/2017, 9:24 PM

## 2017-11-27 DIAGNOSIS — F419 Anxiety disorder, unspecified: Secondary | ICD-10-CM

## 2017-11-27 DIAGNOSIS — F12150 Cannabis abuse with psychotic disorder with delusions: Secondary | ICD-10-CM

## 2017-11-27 DIAGNOSIS — F29 Unspecified psychosis not due to a substance or known physiological condition: Secondary | ICD-10-CM

## 2017-11-27 DIAGNOSIS — F333 Major depressive disorder, recurrent, severe with psychotic symptoms: Principal | ICD-10-CM

## 2017-11-27 DIAGNOSIS — Z818 Family history of other mental and behavioral disorders: Secondary | ICD-10-CM

## 2017-11-27 DIAGNOSIS — R440 Auditory hallucinations: Secondary | ICD-10-CM

## 2017-11-27 DIAGNOSIS — F17210 Nicotine dependence, cigarettes, uncomplicated: Secondary | ICD-10-CM

## 2017-11-27 MED ORDER — RISPERIDONE 1 MG PO TABS
1.0000 mg | ORAL_TABLET | Freq: Two times a day (BID) | ORAL | Status: DC
  Administered 2017-11-27 – 2017-11-28 (×3): 1 mg via ORAL
  Filled 2017-11-27 (×5): qty 1

## 2017-11-27 NOTE — Progress Notes (Signed)
Recreation Therapy Notes  Date: 11/27/17 Time: 1000 Location: 500 Hall Dayroom  Group Topic: Wellness  Goal Area(s) Addresses:  Patient will define components of whole wellness. Patient will verbalize benefit of whole wellness.  Intervention:  Psychiatrist  Activity: Sharks in [employer].  Each patient was given a rubber disc, with one extra disc for the group.  Patients were to use the discs to step on to get from one end of the hall to the other and back.  If anyone stepped off their disc, the group would have to start over from the beginning.   Education: Wellness, Dentist.   Education Outcome: Acknowledges education/In group clarification offered/Needs additional education.   Clinical Observations/Feedback: Pt did not attend group.   Victorino Sparrow, LRT/CTRS         Victorino Sparrow A 11/27/2017 12:36 PM

## 2017-11-27 NOTE — Progress Notes (Signed)
Patient did not attend wrap up group. 
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
## 2017-11-27 NOTE — Tx Team (Signed)
Interdisciplinary Treatment and Diagnostic Plan Update  11/27/2017 Time of Session: 3:04 PM  [name] MRN: [id number]  Principal Diagnosis: <principal problem not specified>  Secondary Diagnoses: Active Problems:   MDD (major depressive disorder), recurrent, severe, with psychosis ([place])   Current Medications:  Current Facility-Administered Medications  Medication Dose Route Frequency Provider Last Rate Last Dose  . acetaminophen (TYLENOL) tablet 650 mg  650 mg Oral Q6H PRN Ethelene Hal, NP      . alum & mag hydroxide-simeth (MAALOX/MYLANTA) 200-200-20 MG/5ML suspension 30 mL  30 mL Oral Q4H PRN Ethelene Hal, NP      . gabapentin (NEURONTIN) capsule 200 mg  200 mg Oral BID Ethelene Hal, NP   200 mg at 11/27/17 0813  . hydrOXYzine (ATARAX/VISTARIL) tablet 25 mg  25 mg Oral TID PRN Ethelene Hal, NP      . magnesium hydroxide (MILK OF MAGNESIA) suspension 30 mL  30 mL Oral Daily PRN Ethelene Hal, NP      . nicotine (NICODERM CQ - dosed in mg/24 hours) patch 21 mg  21 mg Transdermal Daily Pennelope Bracken, MD   21 mg at 11/27/17 0813  . risperiDONE (RISPERDAL) tablet 1 mg  1 mg Oral BID Pennelope Bracken, MD   1 mg at 11/27/17 1427  . traZODone (DESYREL) tablet 50 mg  50 mg Oral QHS PRN Ethelene Hal, NP   50 mg at 11/26/17 2341    PTA Medications: No medications prior to admission.    Patient Stressors: Legal issue Marital or family conflict  Patient Strengths: Average or above average intelligence Capable of independent living General fund of knowledge  Treatment Modalities: Medication Management, Group therapy, Case management,  1 to 1 session with clinician, Psychoeducation, Recreational therapy.   Physician Treatment Plan for Primary Diagnosis: <principal problem not specified> Long Term Goal(s): Improvement in symptoms so as ready for discharge  Short Term Goals:    Medication Management:  Evaluate patient's response, side effects, and tolerance of medication regimen.  Therapeutic Interventions: 1 to 1 sessions, Unit Group sessions and Medication administration.  Evaluation of Outcomes: Progressing  Physician Treatment Plan for Secondary Diagnosis: Active Problems:   MDD (major depressive disorder), recurrent, severe, with psychosis ([place])   Long Term Goal(s): Improvement in symptoms so as ready for discharge  Short Term Goals:    Medication Management: Evaluate patient's response, side effects, and tolerance of medication regimen.  Therapeutic Interventions: 1 to 1 sessions, Unit Group sessions and Medication administration.  Evaluation of Outcomes: Progressing   RN Treatment Plan for Primary Diagnosis: <principal problem not specified> Long Term Goal(s): Knowledge of disease and therapeutic regimen to maintain health will improve  Short Term Goals: Ability to identify and develop effective coping behaviors will improve and Compliance with prescribed medications will improve  Medication Management: RN will administer medications as ordered by provider, will assess and evaluate patient's response and provide education to patient for prescribed medication. RN will report any adverse and/or side effects to prescribing provider.  Therapeutic Interventions: 1 on 1 counseling sessions, Psychoeducation, Medication administration, Evaluate responses to treatment, Monitor vital signs and CBGs as ordered, Perform/monitor CIWA, COWS, AIMS and Fall Risk screenings as ordered, Perform wound care treatments as ordered.  Evaluation of Outcomes: Progressing   LCSW Treatment Plan for Primary Diagnosis: <principal problem not specified> Long Term Goal(s): Safe transition to appropriate next level of care at discharge, Engage patient in therapeutic group addressing interpersonal concerns.  Short Term Goals:  Engage patient in aftercare planning with referrals and  resources  Therapeutic Interventions: Assess for all discharge needs, 1 to 1 time with Social worker, Explore available resources and support systems, Assess for adequacy in community support network, Educate family and significant other(s) on suicide prevention, Complete Psychosocial Assessment, Interpersonal group therapy.  Evaluation of Outcomes: Met  Return home, follow up [facility]   Progress in Treatment: Attending groups: Yes Participating in groups: Yes Taking medication as prescribed: Yes Toleration medication: Yes, no side effects reported at this time Family/Significant other contact made: No Patient understands diagnosis: No  Limited insgiht Discussing patient identified problems/goals with staff: Yes Medical problems stabilized or resolved: Yes Denies suicidal/homicidal ideation: Yes Issues/concerns per patient self-inventory: None Other: N/A  New problem(s) identified: None identified at this time.   New Short Term/Long Term Goal(s): "I need help with my pain."  Discharge Plan or Barriers:   Reason for Continuation of Hospitalization: Paranoia Hallucinations  Medication stabilization  Estimated Length of Stay: 12/7  Attendees: Patient: Tyler White 11/27/2017  3:04 PM  Physician: Maris Berger, MD 11/27/2017  3:04 PM  Nursing: Sena Hitch, RN 11/27/2017  3:04 PM  RN Care Manager: Lars Pinks, RN 11/27/2017  3:04 PM  Social Worker: Ripley Fraise 11/27/2017  3:04 PM  Recreational Therapist: Winfield Cunas 11/27/2017  3:04 PM  Other: Norberto Sorenson 11/27/2017  3:04 PM  Other:  11/27/2017  3:04 PM    Scribe for Treatment Team:  Roque Lias LCSW 11/27/2017 3:04 PM

## 2017-11-27 NOTE — Progress Notes (Signed)
Patient ID: Tyler White, male   DOB: [birth year], 21 y.o.   MRN: [id number]  Pt currently presents with a flat affect and depressed behavior. Pt was lying in bed, chooses not to attend group. Pt forwards little to Probation officer. Pt reports good sleep with current medication regimen.   Pt provided with medications per providers orders. Pt's labs and vitals were monitored throughout the night. Pt given a 1:1 about emotional and mental status. Pt supported and encouraged to express concerns and questions. Pt signed and Probation officer witnessed Voluntary Consent form per MD orders. Pt educated on medications and voluntary admission status, verbal understanding expressed.   Pt's safety ensured with 15 minute and environmental checks. Pt currently denies SI/HI and A/V hallucinations. Pt verbally agrees to seek staff if SI/HI or A/VH occurs and to consult with staff before acting on any harmful thoughts. Will continue POC.
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
## 2017-11-27 NOTE — Progress Notes (Signed)
Tyler White in bed with eyes open. Presents with elective mutism as he refused to respond to this Probation officer. Will continue to monitor patient. Safety checks maintained.

## 2017-11-27 NOTE — BHH Suicide Risk Assessment (Signed)
Tyler White INPATIENT:  Family/Significant Other Suicide Prevention Education  Suicide Prevention Education:  Contact Attempts: Tyler White, mother, [phone] has been identified by the patient as the family member/significant other with whom the patient will be residing, and identified as the person(s) who will aid the patient in the event of a mental health crisis.  With written consent from the patient, two attempts were made to provide suicide prevention education, prior to and/or following the patient's discharge.  We were unsuccessful in providing suicide prevention education.  A suicide education pamphlet was given to the patient to share with family/significant other.  Date and time of first attempt: 11/29 11:13 AM   Spoke with mother about her concerns.  Pt has been unwilling to get mental health help in the past, despite her pleas.  He sits at home and does not leave the house, not even to go to court-which has resulted in failure to appear charges.  She believes he is paranoid.  She is a single mother with financial struggles and no family support here. Family lives in [place] and [place], and they have told her they think he should go to [employer], but will not help her by offering to let him stay with them. She would like him to go to a [place].  I explained the criteria and encouraged her to apply for disability. She is afraid he will not take his meds and continue to exhibit paranoia and focus his anger at the family.  Tyler White [place] 11/27/2017, 3:07 PM

## 2017-11-27 NOTE — Progress Notes (Signed)
Patient ID: Tyler White, male   DOB: [birth year], 21 y.o.   MRN: [id number]  DAR: Pt. Denies SI/HI and A/V Hallucinations however patient does appear preoccupied. At this time it is unclear, on writer's assessment, if he is experiencing hallucinations. Patient does not report any pain or discomfort this morning however this evening he reports that he is having some pain in his groin. Patient has a history of an inguinal hernia. Patient reports, "I was supposed to have surgery in [employer] but I didn't have transportation." Patient was offered Tylenol but refused, stating, "I'll be okay." MD Nancy Fetter is aware of patient's complaint. Support and encouragement provided to the patient. Scheduled medications administered to patient per physician's orders. Patient is isolated to his room throughout most of the day. He does sit in the dayroom this evening before dinner but does not interact with his peers. His affect is flat, angry and his mood is preoccupied. Q15 minute checks are maintained for safety.

## 2017-11-28 MED ORDER — IBUPROFEN 600 MG PO TABS
600.0000 mg | ORAL_TABLET | Freq: Four times a day (QID) | ORAL | Status: DC
  Administered 2017-11-28: 600 mg via ORAL
  Filled 2017-11-28 (×5): qty 1

## 2017-11-28 MED ORDER — SERTRALINE HCL 50 MG PO TABS
50.0000 mg | ORAL_TABLET | Freq: Every day | ORAL | Status: DC
  Administered 2017-11-28 – 2017-12-02 (×5): 50 mg via ORAL
  Filled 2017-11-28 (×7): qty 1

## 2017-11-28 MED ORDER — IBUPROFEN 600 MG PO TABS
600.0000 mg | ORAL_TABLET | Freq: Four times a day (QID) | ORAL | Status: DC | PRN
  Administered 2017-11-30: 600 mg via ORAL
  Filled 2017-11-28: qty 1

## 2017-11-28 MED ORDER — RISPERIDONE 1 MG PO TABS
1.0000 mg | ORAL_TABLET | ORAL | Status: DC
  Administered 2017-11-29: 1 mg via ORAL
  Filled 2017-11-28 (×3): qty 1

## 2017-11-28 MED ORDER — RISPERIDONE 2 MG PO TABS
2.0000 mg | ORAL_TABLET | Freq: Every day | ORAL | Status: DC
  Administered 2017-11-28: 2 mg via ORAL
  Filled 2017-11-28 (×3): qty 1

## 2017-11-28 NOTE — Progress Notes (Signed)
Did not attend group 
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
## 2017-11-28 NOTE — Progress Notes (Signed)
Recreation Therapy Notes  INPATIENT RECREATION THERAPY ASSESSMENT  Patient Details Name: Tyler White MRN: [id number] DOB: [birth year] Today's Date: 11/28/2017  Patient Stressors: Other (Comment)(Behavior, pain, head trauma)  Pt stated he was here for "CVI commitment".  Coping Skills:   Isolate, Arguments, Avoidance, Self-Injury, Talking, Music, Sports  Personal Challenges: Self-Esteem/Confidence, Social Interaction, Stress Management, Trusting Others  Leisure Interests (2+):  Exercise - Walking  Awareness of Community Resources:  No  Patient Strengths:  Communicating; comprehending  Patient Identified Areas of Improvement:  Driving  Current Recreation Participation:  "I don't really get to it"  Patient Goal for Hospitalization:  "Get help"  [place] of Residence:  [place] of Residence:  [place]  Current [place] (including self-harm):  No  Current HI:  No  Consent to Intern Participation: N/A   Victorino Sparrow, LRT/CTRS  Victorino Sparrow A 11/28/2017, 2:51 PM

## 2017-11-28 NOTE — Progress Notes (Signed)
D: Pt A & O to self, place and event. Denies VH and HI. Pt endorsed intermittent AH "it's on and off but it's still there" and SI without plan. Verbally contracts for safety. Presents guarded and depressed with flat affect.  Attended unit groups. Interacts minimally on as need basis. Showered and changed clothes this shift.  A: Scheduled and PRN medications given as per MD's order with verbal education and effects monitored. Emotional support provided to pt throughout this shift. Encouragement offered towards treatment compliance. Q 15 minutes safety remains effective without incident. R: Pt receptive to care. Tolerates all PO intake well. Remains safe on and off unit.

## 2017-11-28 NOTE — Progress Notes (Signed)
Recreation Therapy Notes  Date: 11/28/17 Time: 1000 Location: 500 Hall Dayroom  Group Topic: Leisure Education  Goal Area(s) Addresses:  Patient will identify positive leisure activities.  Patient will identify one positive benefit of participation in leisure activities.   Behavioral Response: Minimal  Intervention: [employer], dry erase marker, eraser, various words  Activity:  Pictionary.  LRT divided the group into two teams.  Each team was given one minute to draw/guess their picture.  If they did not guess the correct answer, the other team got a chance to still the point.    Education:  Leisure Education, Dentist  Education Outcome: Acknowledges education/In group clarification offered/Needs additional education  Clinical Observations/Feedback: Pt was quiet but engaged when prompted.  Pt was flat but pleasant.    Victorino Sparrow, LRT/CTRS       Victorino Sparrow A 11/28/2017 11:42 AM

## 2017-11-29 DIAGNOSIS — R443 Hallucinations, unspecified: Secondary | ICD-10-CM

## 2017-11-29 DIAGNOSIS — R451 Restlessness and agitation: Secondary | ICD-10-CM

## 2017-11-29 DIAGNOSIS — R450 Nervousness: Secondary | ICD-10-CM

## 2017-11-29 DIAGNOSIS — G4700 Insomnia, unspecified: Secondary | ICD-10-CM

## 2017-11-29 MED ORDER — RISPERIDONE 2 MG PO TABS
2.0000 mg | ORAL_TABLET | ORAL | Status: DC
  Administered 2017-11-30 – 2017-12-02 (×3): 2 mg via ORAL
  Filled 2017-11-29 (×5): qty 1

## 2017-11-29 MED ORDER — BENZTROPINE MESYLATE 0.5 MG PO TABS
0.5000 mg | ORAL_TABLET | Freq: Two times a day (BID) | ORAL | Status: DC
  Administered 2017-11-29 – 2017-12-02 (×6): 0.5 mg via ORAL
  Filled 2017-11-29 (×9): qty 1

## 2017-11-29 MED ORDER — RISPERIDONE 2 MG PO TABS
2.0000 mg | ORAL_TABLET | Freq: Every day | ORAL | Status: DC
  Administered 2017-11-29 – 2017-12-01 (×3): 2 mg via ORAL
  Filled 2017-11-29 (×5): qty 1

## 2017-11-29 NOTE — Progress Notes (Signed)
D. Pt presents with a flat affect with guarded but cooperative behavior. Pt approached this Writer reporting that he felt "irritable" and anxious since learning he wasn't being discharged. Pt agreed to prn med for anxiety.Pt currently endorses passive SI and agrees to contact staff before acting on any harmful thoughts. Pt also endorses AH "at times". Per pt's self inventory, pt rates his depression, hopelessness and anxiety 0/0/0 respectively. A. Labs and vitals monitored. Pt compliant with scheduled and prn medications. Pt supported emotionally and encouraged to express concerns and ask questions.   R. Pt remains safe with 15 minute checks. Will continue POC.

## 2017-11-29 NOTE — Progress Notes (Signed)
[facility] MD Progress Note  11/29/2017 2:28 PM Tyler White  MRN:  [id number]   Subjective: Tyler White reports " I am ready to go back home"   Objective: Tyler White is awake, alert and oriented. Seen resting in bedroom. Patient is requesting to be discharge. However reports ongoing  auditory hallucination. "reports sometimes they tell me to hurt myself." reports taken medications a prescribed and reports tolerating well. Reports some improvement. Patient states "if you just let me go home I will come back."  Patient appears to be thought blocking and slow to responded at times. Jasper denies depression or depressvie symptoms. Noted that patient continues to isolate  to his room. patient reports attending some group sessions. Support, encouragement and reassurance was provided.   Principal Problem: Psychosis ([place]) Diagnosis:   Patient Active Problem List   Diagnosis Date Noted  . Psychosis ([place]) [F29] 11/27/2017  . Cannabis abuse with psychotic disorder, with delusions ([place]) [F12.150] 11/26/2017  . Major depression, single episode [F32.9] 11/26/2017  . MDD (major depressive disorder), recurrent, severe, with psychosis ([place]) [F33.3] 11/26/2017   Total Time spent with patient: 30 minutes  Past Psychiatric History: see H&P  Past Medical History:  Past Medical History:  Diagnosis Date  . Hernia, inguinal, right   . Inguinal hernia    right  . Psychiatric illness    History reviewed. No pertinent surgical history. Family History:  Family History  Problem Relation Age of Onset  . Psychiatric Illness Mother   . Hypertension Sister    Family Psychiatric  History: see H&P Social History:  Social History   Substance and Sexual Activity  Alcohol Use Yes   Comment: "couple times a week"     Social History   Substance and Sexual Activity  Drug Use Yes  . Types: Marijuana   Comment: daily use    Social History   Socioeconomic History  . Marital status: Single    Spouse name:  None  . Number of children: None  . Years of education: None  . Highest education level: None  Social Needs  . Financial resource strain: None  . Food insecurity - worry: None  . Food insecurity - inability: None  . Transportation needs - medical: None  . Transportation needs - non-medical: None  Occupational History  . None  Tobacco Use  . Smoking status: Current Every Day Smoker    Packs/day: 0.50    Years: 5.00    Pack years: 2.50    Types: Cigarettes  . Smokeless tobacco: Never Used  Substance and Sexual Activity  . Alcohol use: Yes    Comment: "couple times a week"  . Drug use: Yes    Types: Marijuana    Comment: daily use  . Sexual activity: Yes    Birth control/protection: None  Other Topics Concern  . None  Social History Narrative   ** Merged History Encounter **       Additional Social History:                         Sleep: Good  Appetite:  Good  Current Medications: Current Facility-Administered Medications  Medication Dose Route Frequency Provider Last Rate Last Dose  . acetaminophen (TYLENOL) tablet 650 mg  650 mg Oral Q6H PRN Ethelene Hal, NP   650 mg at 11/28/17 0906  . alum & mag hydroxide-simeth (MAALOX/MYLANTA) 200-200-20 MG/5ML suspension 30 mL  30 mL Oral Q4H PRN Ethelene Hal, NP      .  gabapentin (NEURONTIN) capsule 200 mg  200 mg Oral BID Ethelene Hal, NP   200 mg at 11/29/17 3244  . hydrOXYzine (ATARAX/VISTARIL) tablet 25 mg  25 mg Oral TID PRN Ethelene Hal, NP   25 mg at 11/29/17 1011  . ibuprofen (ADVIL,MOTRIN) tablet 600 mg  600 mg Oral Q6H PRN Pennelope Bracken, MD      . magnesium hydroxide (MILK OF MAGNESIA) suspension 30 mL  30 mL Oral Daily PRN Ethelene Hal, NP      . nicotine (NICODERM CQ - dosed in mg/24 hours) patch 21 mg  21 mg Transdermal Daily Pennelope Bracken, MD   21 mg at 11/28/17 0900  . [START ON 11/30/2017] risperiDONE (RISPERDAL) tablet 2 mg  2 mg Oral  Delena Bali, NP       And  . risperiDONE (RISPERDAL) tablet 2 mg  2 mg Oral QHS Derrill Center, NP      . sertraline (ZOLOFT) tablet 50 mg  50 mg Oral Daily Pennelope Bracken, MD   50 mg at 11/29/17 0837  . traZODone (DESYREL) tablet 50 mg  50 mg Oral QHS PRN Ethelene Hal, NP   50 mg at 11/26/17 2341    Lab Results: No results found for this or any previous visit (from the past 48 hour(s)).  Blood Alcohol level:  Lab Results  Component Value Date   ETH <10 01/01/7252    Metabolic Disorder Labs: No results found for: HGBA1C, MPG No results found for: PROLACTIN No results found for: CHOL, TRIG, HDL, CHOLHDL, VLDL, LDLCALC  Physical Findings: AIMS: Facial and Oral Movements Muscles of Facial Expression: None, normal Lips and Perioral Area: None, normal Jaw: None, normal Tongue: None, normal,Extremity Movements Upper (arms, wrists, hands, fingers): None, normal Lower (legs, knees, ankles, toes): None, normal, Trunk Movements Neck, shoulders, hips: None, normal, Overall Severity Severity of abnormal movements (highest score from questions above): None, normal Incapacitation due to abnormal movements: None, normal Patient's awareness of abnormal movements (rate only patient's report): No Awareness, Dental Status Current problems with teeth and/or dentures?: No Does patient usually wear dentures?: No  CIWA:    COWS:     Musculoskeletal: Strength & Muscle Tone: within normal limits Gait & Station: normal Patient leans: N/A  Psychiatric Specialty Exam: Physical Exam  Nursing note and vitals reviewed.   Review of Systems  Constitutional: Negative for chills and fever.  HENT: Negative for hearing loss.   Respiratory: Negative for cough.   Cardiovascular: Negative for chest pain.  Gastrointestinal: Negative for abdominal pain, heartburn, nausea and vomiting.  Psychiatric/Behavioral: Positive for depression, hallucinations and suicidal ideas. The  patient is nervous/anxious.     Blood pressure 138/83, pulse 63, temperature 98.1 F (36.7 C), temperature source Oral, resp. rate 20, height 5\' 10"  (1.778 m), weight 68 kg (150 lb).Body mass index is 21.52 kg/m.  General Appearance: Casual  Eye Contact:  Good  Speech:  Clear and Coherent and Normal Rate  Volume:  Normal  Mood:  Anxious and Depressed  Affect:  Appropriate, Congruent and Flat  Thought Process:  Coherent and Disorganized patient present internally preoccupied   Orientation:  Full (Time, Place, and Person)  Thought Content:  Hallucinations: Auditory  Suicidal Thoughts:  Yes.  without intent/plan  Homicidal Thoughts:  No  Memory:  Immediate;   Good Recent;   Good Remote;   Good  Judgement:  Fair  Insight:  Fair  Psychomotor Activity:  Normal  Concentration:  Concentration: Good  Recall:  [employer] of Knowledge:  Poor  Language:  Fair  Akathisia:  No  Handed:    AIMS (if indicated):     Assets:  Armed forces logistics/support/administrative officer Physical Health Resilience Social Support  ADL's:  Intact  Cognition:  WNL  Sleep:  Number of Hours: 4.5     Treatment Plan Summary: Daily contact with patient to assess and evaluate symptoms and progress in treatment and Medication management.    - Psychosis unspecified r/o schizophrenia vs substance induced psychosis            - Increased risperdal 1mg  BID to Risperdal 2 mg PO BID qhs - Depression     - Continue zoloft 50mg  qDay  - Agitation/aggression              - Continue gabapentin 200mg  BID  - Anxiety             - Continue atarax 25mg  q8h prn anxiety  -Insomnia             - Continue trazodone 50mg  qhs prn insomnia -EPS       -Start cogentin 0.5mg  PO BID   -Encourage participation in groups and therapeutic milieu -Discharge planning will be ongoing -Pt agrees to sign in voluntarily    Derrill Center, NP 11/29/2017, 2:28 PM

## 2017-11-29 NOTE — [facility] Group Notes (Signed)
[facility] Group Notes: (Clinical Social Work)   11/29/2017      Type of Therapy:  Group Therapy   Participation Level:  Did Not Attend despite MHT prompting   Selmer Dominion, LCSW 11/29/2017, 12:16 PM

## 2017-11-29 NOTE — Progress Notes (Signed)
D: Pt denies SI/HI/, passive AH. Pt is pleasant and cooperative. Pt isolates to his room this evening, pt comes out for medications. Pt presents with thought blocking, but will answer questions by a nod.   A: Pt was offered support and encouragement. Pt was given scheduled medications. Pt was encourage to attend groups. Q 15 minute checks were done for safety.   R: safety maintained on unit.

## 2017-11-29 NOTE — Progress Notes (Signed)
Interaction with pt was brief as he chose not to engage in conversation with Probation officer.  He did deny  SI/HI/AVH, but does appear to possibly hearing voices.  He took a shower on this shift, but still had an odor as if he did not use soap.  Pt was encouraged to make his needs known to staff.  He took his evening med as ordered without incident.  Support and encouragement offered.  Discharged plans are in process.  Safety maintained with q15 minute checks.

## 2017-11-29 NOTE — Progress Notes (Signed)
Adult Psychoeducational Group Note  Date:  11/29/2017 Time:  9:32 PM  Group Topic/Focus:  Wrap-Up Group:   The focus of this group is to help patients review their daily goal of treatment and discuss progress on daily workbooks.  Participation Level:  Did Not Attend  Participation Quality:  Did not attend  Affect:  Did not attend  Cognitive:  Did not attend  Insight: None  Engagement in Group:  Did not attend  Modes of Intervention:  Did not attend  Additional Comments:  Pt did not attend evening wrap up group these evening.  Candy Sledge 11/29/2017, 9:32 PM

## 2017-11-30 DIAGNOSIS — F332 Major depressive disorder, recurrent severe without psychotic features: Secondary | ICD-10-CM

## 2017-11-30 NOTE — Progress Notes (Signed)
D. Pt in bed resting upon initial approach. Pt presents with flat affect and sad mood, although he rates his depression a 1/10 on self inventory. Pt denies anxiety when asked, but rates his anxiety a 10/10 on self inventory.Pt presenting with thought blocking and is slow to respond during interactions. Pt continues to endorse intermittent A/V hallucinations. Pt currently endorses passive SI with no plan but agrees to contact staff before acting on any harmful thoughts.  A. Labs and vitals monitored. Pt compliant with scheduled and prn medications. Pt supported emotionally and encouraged to express concerns and ask questions.   R. Pt remains safe with 15 minute checks. Will continue POC.

## 2017-11-30 NOTE — Plan of Care (Signed)
Pt safe on the unit at this time, pt +ve AH , pt has been calm on the unit this evening.

## 2017-11-30 NOTE — [facility] Group Notes (Signed)
[facility] [place] Group Notes:  (Nursing )  Date:  11/30/2017  Time: 1:30 Type of Therapy:  Nurse Education  Participation Level:  Active  Participation Quality:  Appropriate  Affect:  Appropriate and Flat  Cognitive:  Appropriate  Insight:  Appropriate  Engagement in Group:  Engaged  Modes of Intervention:  Discussion  Summary of Progress/Problems: Group discussion on various topics: pt discussed his favorite book. Pt seemed to enjoy the game Waymond Cera 11/30/2017, 3:15 PM

## 2017-11-30 NOTE — [facility] Group Notes (Signed)
[place] LCSW Group Therapy Note  Date/Time:  11/30/2017  11:00AM-12:00PM  Type of Therapy and Topic:  Group Therapy:  Music and Mood  Participation Level:  Minimal   Description of Group: In this process group, members listened to a variety of genres of music and identified that different types of music evoke different responses.  Patients were encouraged to identify music that was soothing for them and music that was energizing for them.  Patients discussed how this knowledge can help with wellness and recovery in various ways including managing depression and anxiety as well as encouraging healthy sleep habits.    Therapeutic Goals: 1. Patients will explore the impact of different varieties of music on mood 2. Patients will verbalize the thoughts they have when listening to different types of music 3. Patients will identify music that is soothing to them as well as music that is energizing to them 4. Patients will discuss how to use this knowledge to assist in maintaining wellness and recovery 5. Patients will explore the use of music as a coping skill  Summary of Patient Progress:  At the beginning of group, patient expressed he had low energy, and he left after about 45 minutes, stated that he enjoyed the music.  Therapeutic Modalities: Solution Focused Brief Therapy Motivational Interviewing Activity   Selmer Dominion, LCSW 11/30/2017 8:22 AM

## 2017-11-30 NOTE — Progress Notes (Signed)
D: Pt passive SI/ AH- contracts for safety. Pt stated he feels the same. Pt informed to give the medications time to work due to them taking weeks to reach a therapeutic effect. . Pt is pleasant and cooperative. Pt isolates to room with minimal interaction with peers  A: Pt was offered support and encouragement. Pt was given scheduled medications. Pt was encourage to attend groups. Q 15 minute checks were done for safety.   R:Pt receptive to treatment and safety maintained on unit.
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
## 2017-12-01 NOTE — Progress Notes (Signed)
Recreation Therapy Notes  Date: 12/01/17 Time: 1000 Location: 500 Hall Dayroom  Group Topic: Coping Skills  Goal Area(s) Addresses:  Patient will be able to identify positive coping skills. Patient will be able to identify the importance of using coping skills. Patient will be able to identify the benefits of using coping skills post d/c.  Behavioral Response: Minimal  Intervention: Mind map, pencils, white board, marker, eraser  Activity: Mind map.  Patients were given a blank mind map.  LRT and patients filled in the first eight boxes together with anxiety, mourning a loss, depression, stop smoking, not being able to sleep, anger, loneliness and pain.  Individually, patients were to identify at least three coping skills for each situation identified.  Group would come back together and LRT would right the coping skills identified on the board.  Education: Radiographer, therapeutic, Dentist.   Education Outcome: Acknowledges understanding/In group clarification offered/Needs additional education.   Clinical Observations/Feedback: Pt was quiet.  Pt filled out his mind map with some assistance from LRT.  Pt stated come of his coping skills were workout, talk to someone, nicotine path for stop smoking; play ball, take a walk, smoke for anger; and cry, think of good memories for mourning a loss.  Pt left early with NP and did not return.    Victorino Sparrow, LRT/CTRS       Victorino Sparrow A 12/01/2017 11:44 AM

## 2017-12-01 NOTE — Progress Notes (Signed)
Adult Psychoeducational Group Note  Date:  12/01/2017 Time:  9:59 PM  Group Topic/Focus:  Wrap-Up Group:   The focus of this group is to help patients review their daily goal of treatment and discuss progress on daily workbooks.  Participation Level:  Minimal  Participation Quality:  Appropriate  Affect:  Flat  Cognitive:  Lacking  Insight: Limited  Engagement in Group:  Engaged  Modes of Intervention:  Socialization and Support  Additional Comments:  Patient attended and participated in group tonight. He speak very soft. He had to be asked several times to speak up. He did stated that he went for meals and attended groups  Debe Coder 12/01/2017, 9:59 PM

## 2017-12-01 NOTE — Progress Notes (Signed)
[facility] MD Progress Note  12/01/2017 12:27 PM Tyler White  MRN:  [id number]   Subjective: Tyler White reports " everything is everything today. I really would like to go home. Patient is requesting to sign voluntary."  Objective: Tyler White was evaluated by MD and NP. Patient reports feeling much better today and feels ready to leave. Reports multiple concerns with getting back to work and court dates and house arrest monitoring Tyler White. Tyler White reports the auditory hallucination have improved with the medicaiton adjustment. Reports the voice are "getting quieter." Reports tolerating medication well. Denies thought to hurt self or others today. Reports taken medications and tolerating medications well. States he eating and resting well when he gets to sleep. Support, encouragement and reassurance was provided.   Principal Problem: Psychosis ([name]) Diagnosis:   Patient Active Problem List   Diagnosis Date Noted  . Psychosis ([place]) [F29] 11/27/2017  . Cannabis abuse with psychotic disorder, with delusions ([place]) [F12.150] 11/26/2017  . Major depression, single episode [F32.9] 11/26/2017  . MDD (major depressive disorder), recurrent, severe, with psychosis ([place]) [F33.3] 11/26/2017   Total Time spent with patient: 30 minutes  Past Psychiatric History: see H&P  Past Medical History:  Past Medical History:  Diagnosis Date  . Hernia, inguinal, right   . Inguinal hernia    right  . Psychiatric illness    History reviewed. No pertinent surgical history. Family History:  Family History  Problem Relation Age of Onset  . Psychiatric Illness Mother   . Hypertension Sister    Family Psychiatric  History: see H&P Social History:  Social History   Substance and Sexual Activity  Alcohol Use Yes   Comment: "couple times a week"     Social History   Substance and Sexual Activity  Drug Use Yes  . Types: Marijuana   Comment: daily use    Social History   Socioeconomic History  .  Marital status: Single    Spouse name: None  . Number of children: None  . Years of education: None  . Highest education level: None  Social Needs  . Financial resource strain: None  . Food insecurity - worry: None  . Food insecurity - inability: None  . Transportation needs - medical: None  . Transportation needs - non-medical: None  Occupational History  . None  Tobacco Use  . Smoking status: Current Every Day Smoker    Packs/day: 0.50    Years: 5.00    Pack years: 2.50    Types: Cigarettes  . Smokeless tobacco: Never Used  Substance and Sexual Activity  . Alcohol use: Yes    Comment: "couple times a week"  . Drug use: Yes    Types: Marijuana    Comment: daily use  . Sexual activity: Yes    Birth control/protection: None  Other Topics Concern  . None  Social History Narrative   ** Merged History Encounter **       Additional Social History:                         Sleep: Good  Appetite:  Good  Current Medications: Current Facility-Administered Medications  Medication Dose Route Frequency Provider Last Rate Last Dose  . acetaminophen (TYLENOL) tablet 650 mg  650 mg Oral Q6H PRN Ethelene Hal, NP   650 mg at 11/28/17 0906  . alum & mag hydroxide-simeth (MAALOX/MYLANTA) 200-200-20 MG/5ML suspension 30 mL  30 mL Oral Q4H PRN Ethelene Hal, NP      .  benztropine (COGENTIN) tablet 0.5 mg  0.5 mg Oral BID Derrill Center, NP   0.5 mg at 12/01/17 0802  . gabapentin (NEURONTIN) capsule 200 mg  200 mg Oral BID Ethelene Hal, NP   200 mg at 12/01/17 0802  . hydrOXYzine (ATARAX/VISTARIL) tablet 25 mg  25 mg Oral TID PRN Ethelene Hal, NP   25 mg at 11/30/17 2104  . ibuprofen (ADVIL,MOTRIN) tablet 600 mg  600 mg Oral Q6H PRN Pennelope Bracken, MD   600 mg at 11/30/17 1254  . magnesium hydroxide (MILK OF MAGNESIA) suspension 30 mL  30 mL Oral Daily PRN Ethelene Hal, NP      . nicotine (NICODERM CQ - dosed in mg/24 hours)  patch 21 mg  21 mg Transdermal Daily Pennelope Bracken, MD   21 mg at 12/01/17 0804  . risperiDONE (RISPERDAL) tablet 2 mg  2 mg Oral Delena Bali, NP   2 mg at 12/01/17 7902   And  . risperiDONE (RISPERDAL) tablet 2 mg  2 mg Oral QHS Derrill Center, NP   2 mg at 11/30/17 2104  . sertraline (ZOLOFT) tablet 50 mg  50 mg Oral Daily Pennelope Bracken, MD   50 mg at 12/01/17 0802  . traZODone (DESYREL) tablet 50 mg  50 mg Oral QHS PRN Ethelene Hal, NP   50 mg at 11/30/17 2104    Lab Results: No results found for this or any previous visit (from the past 57 hour(s)).  Blood Alcohol level:  Lab Results  Component Value Date   ETH <10 40/97/3532    Metabolic Disorder Labs: No results found for: HGBA1C, MPG No results found for: PROLACTIN No results found for: CHOL, TRIG, HDL, CHOLHDL, VLDL, LDLCALC  Physical Findings: AIMS: Facial and Oral Movements Muscles of Facial Expression: None, normal Lips and Perioral Area: None, normal Jaw: None, normal Tongue: None, normal,Extremity Movements Upper (arms, wrists, hands, fingers): None, normal Lower (legs, knees, ankles, toes): None, normal, Trunk Movements Neck, shoulders, hips: None, normal, Overall Severity Severity of abnormal movements (highest score from questions above): None, normal Incapacitation due to abnormal movements: None, normal Patient's awareness of abnormal movements (rate only patient's report): No Awareness, Dental Status Current problems with teeth and/or dentures?: No Does patient usually wear dentures?: No  CIWA:    COWS:     Musculoskeletal: Strength & Muscle Tone: within normal limits Gait & Station: normal Patient leans: N/A  Psychiatric Specialty Exam: Physical Exam  Nursing note and vitals reviewed. Constitutional: He appears well-developed.  Cardiovascular: Normal rate.  Neurological: He is alert.  Skin: Skin is warm and dry.  Psychiatric: He has a normal mood and  affect.    Review of Systems  Psychiatric/Behavioral: Positive for depression, hallucinations (reports the voice are quiter with medicaitons) and suicidal ideas (improving today). The patient is nervous/anxious.     Blood pressure 134/78, pulse (!) 108, temperature 98.4 F (36.9 C), temperature source Oral, resp. rate 16, height 5\' 10"  (1.778 m), weight 68 kg (150 lb).Body mass index is 21.52 kg/m.  General Appearance: Casual  Eye Contact:  Good  Speech:  Clear and Coherent  Volume:  Normal  Mood:  Anxious and Depressed  Affect:  Appropriate, Congruent and Flat  Thought Process:  Coherent and Disorganized patient present internally preoccupied - some improvement noted with mood and thoughts  Orientation:  Full (Time, Place, and Person)  Thought Content:  Hallucinations: Auditory  Suicidal Thoughts:  Yes.  without intent/plan  Homicidal Thoughts:  No  Memory:  Immediate;   Good Recent;   Good Remote;   Good  Judgement:  Fair  Insight:  Fair  Psychomotor Activity:  Normal  Concentration:  Concentration: Good  Recall:  [place] of Knowledge:  Poor  Language:  Fair  Akathisia:  No  Handed:    AIMS (if indicated):     Assets:  Armed forces logistics/support/administrative officer Physical Health Resilience Social Support  ADL's:  Intact  Cognition:  WNL  Sleep:  Number of Hours: 6.75     Treatment Plan Summary: Daily contact with patient to assess and evaluate symptoms and progress in treatment and Medication management.   Continue  With current treatment plan on 12/01/2017 except where noted.   - Psychosis unspecified r/o schizophrenia vs substance induced psychosis            -Continue Risperdal 2 mg PO BID qhs - Depression     - Continue Zoloft 50mg  qDay  - Agitation/aggression              - Continue gabapentin 200mg  BID  - Anxiety             - Continue atarax 25mg  q8h prn anxiety  -Insomnia             - Continue trazodone 50mg  qhs prn insomnia -EPS       Continue cogentin 0.5mg   PO BID   -Encourage participation in groups and therapeutic milieu -Discharge planning will be ongoing -Pt agrees to sign in voluntarily    Derrill Center, NP 12/01/2017, 12:27 PM

## 2017-12-01 NOTE — Progress Notes (Signed)
D: Pt isolates to room, pt stated he felt about the same A: Pt was offered support and encouragement. Pt was given scheduled medications. Pt was encourage to attend groups. Q 15 minute checks were done for safety.   R: Pt is taking medication. Pt has no complaints.Pt receptive to treatment and safety maintained on unit.

## 2017-12-01 NOTE — Plan of Care (Signed)
Pt safe on the unit

## 2017-12-01 NOTE — Progress Notes (Signed)
DAR NOTE: Patient presents with flat affect and depressed mood.  Pt has been visible in the milieu,but not interacting. Pt has been in the day room watching TV. Denies pain, auditory and visual hallucinations.  Rates depression at 4, hopelessness at 4, and anxiety at 6.  Maintained on routine safety checks.  Medications given as prescribed.  Support and encouragement offered as needed. Will continue to monitor.

## 2017-12-02 MED ORDER — NICOTINE 21 MG/24HR TD PT24: 21.0000 mg | MEDICATED_PATCH | Freq: Every day | TRANSDERMAL | 0 refills | Status: DC

## 2017-12-02 MED ORDER — TRAZODONE HCL 50 MG PO TABS: 50.0000 mg | ORAL_TABLET | Freq: Every evening | ORAL | 0 refills | Status: DC | PRN

## 2017-12-02 MED ORDER — IBUPROFEN 600 MG PO TABS: 600.0000 mg | ORAL_TABLET | Freq: Four times a day (QID) | ORAL | 0 refills | Status: DC | PRN

## 2017-12-02 MED ORDER — RISPERIDONE 2 MG PO TABS
ORAL_TABLET | ORAL | 0 refills | Status: DC
Start: 2017-12-02 — End: 2018-06-02

## 2017-12-02 MED ORDER — GABAPENTIN 100 MG PO CAPS: 200.0000 mg | ORAL_CAPSULE | Freq: Two times a day (BID) | ORAL | 0 refills | Status: DC

## 2017-12-02 MED ORDER — SERTRALINE HCL 50 MG PO TABS: 50.0000 mg | ORAL_TABLET | Freq: Every day | ORAL | 0 refills | Status: DC

## 2017-12-02 MED ORDER — HYDROXYZINE HCL 25 MG PO TABS: 25.0000 mg | ORAL_TABLET | Freq: Three times a day (TID) | ORAL | 0 refills | Status: DC | PRN

## 2017-12-02 NOTE — Progress Notes (Signed)
  [facility] Adult Case Management Discharge Plan :  Will you be returning to the same living situation after discharge:  Yes,  home At discharge, do you have transportation home?: Yes,  bus pass Do you have the ability to pay for your medications: Yes,  MCD  Release of information consent forms completed and in the chart;  Patient's signature needed at discharge.  Patient to Follow up at: Follow-up [facility] Follow up on 12/04/2017.   Why:  Thursday at noon for your hospital follow up appointment.  Bring along your ID, MCD card and hospital d/c paperwork .  Norberto Sorenson with [facility] [employer] care can arrange transportation for you to appointments. Her number is [phone] Contact information: [place] [address] [phone]           Next level of care provider has access to [place] and Suicide Prevention discussed: Yes,  yes  Have you used any form of tobacco in the last 30 days? (Cigarettes, Smokeless Tobacco, Cigars, and/or Pipes): Yes  Has patient been referred to the Quitline?: Patient refused referral  Patient has been referred for addiction treatment: Pt. refused referral  [name], LCSW 12/02/2017, 10:44 AM

## 2017-12-02 NOTE — Progress Notes (Signed)
Recreation Therapy Notes  INPATIENT RECREATION TR PLAN  Patient Details Name: Tyler White MRN: [id number] DOB: [birth year] Today's Date: 12/02/2017  Rec Therapy Plan Is patient appropriate for Therapeutic Recreation?: Yes Treatment times per week: about 3 days Estimated Length of Stay: 5-7 days TR Treatment/Interventions: Group participation (Comment)  Discharge Criteria Pt will be discharged from therapy if:: Discharged Treatment plan/goals/alternatives discussed and agreed upon by:: Patient/family  Discharge Summary Short term goals set: Pt will be able to identify at least 5 positive qualities about self at completion of recreation therapy sessions. Short term goals met: Adequate for discharge Progress toward goals comments: Groups attended Which groups?: Goal setting, Coping skills, Leisure education Reason goals not met: None Therapeutic equipment acquired: N/A Reason patient discharged from therapy: Discharge from hospital Pt/family agrees with progress & goals achieved: Yes Date patient discharged from therapy: 12/02/17   Victorino Sparrow, LRT/CTRS  Ria Comment, Avyay Coger A 12/02/2017, 11:47 AM

## 2017-12-02 NOTE — Progress Notes (Signed)
Patient discharged to lobby. Patient was stable and appreciative at that time. All papers and prescriptions were given and valuables returned. Verbal understanding expressed. Denies SI/HI and A/VH. Patient given opportunity to express concerns and ask questions.  

## 2017-12-02 NOTE — Plan of Care (Signed)
Pt showed improved self esteem after completion of life goals recreation therapy session.  Victorino Sparrow, LRT/CTRS
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
## 2017-12-02 NOTE — Progress Notes (Signed)
Recreation Therapy Notes  Date: 12/02/17 Time: 0950 Location: 500 Hall Dayroom  Group Topic: Goal Setting  Goal Area(s) Addresses:  Patient will be able to identify at least 3 life goals.  Patient will be able to identify benefit of investing in life goals.  Patient will be able to identify benefit of setting life goals.   Behavioral Response:  Engaged  Intervention: Worksheet  Activity: Life Goals.  Patients were given a worksheet broken down into 6 categories (family, friends, work/school, spirituality, body and mental health).  Patients were to identify what they were doing well, what they need to improve and make a goal to complete the improvement.  Education:  Discharge Planning, Coping Skills, Life Goals  Education Outcome: Acknowledges Education/In Group Clarification Provided/Needs Additional Education  Clinical Observations: Pt identified family, friends and work/school as his top categories.  Pt stated for family, he does well at taking initiative, needs to work on believing in self and set a goal of stepping up and being a better person; friends, does well at having better surroundings, needs to improve finances and set a goal of working around goals; and work/school does well at studying surroundings, need to improve at improvising and set a goal of thinking rationally.  Pt also expressed people don't reach goals because of "negativity".    Victorino Sparrow, LRT/CTRS     Ria Comment, Rakel Junio A 12/02/2017 11:03 AM
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
## 2018-02-18 ENCOUNTER — Encounter (HOSPITAL_COMMUNITY): Payer: Self-pay | Admitting: Nurse Practitioner

## 2018-02-18 ENCOUNTER — Emergency Department (HOSPITAL_COMMUNITY)
Admission: EM | Admit: 2018-02-18 | Discharge: 2018-02-19 | Disposition: A | Discharge status: Home / Self Care | Payer: Medicaid Other | Attending: Emergency Medicine | Admitting: Emergency Medicine

## 2018-02-18 ENCOUNTER — Other Ambulatory Visit: Payer: Self-pay

## 2018-02-18 DIAGNOSIS — F1410 Cocaine abuse, uncomplicated: Secondary | ICD-10-CM | POA: Diagnosis present

## 2018-02-18 DIAGNOSIS — F1414 Cocaine abuse with cocaine-induced mood disorder: Secondary | ICD-10-CM | POA: Diagnosis not present

## 2018-02-18 DIAGNOSIS — F29 Unspecified psychosis not due to a substance or known physiological condition: Secondary | ICD-10-CM

## 2018-02-18 DIAGNOSIS — Z8659 Personal history of other mental and behavioral disorders: Secondary | ICD-10-CM | POA: Diagnosis not present

## 2018-02-18 DIAGNOSIS — Z818 Family history of other mental and behavioral disorders: Secondary | ICD-10-CM | POA: Diagnosis not present

## 2018-02-18 DIAGNOSIS — Z79899 Other long term (current) drug therapy: Secondary | ICD-10-CM | POA: Insufficient documentation

## 2018-02-18 DIAGNOSIS — F333 Major depressive disorder, recurrent, severe with psychotic symptoms: Secondary | ICD-10-CM | POA: Diagnosis not present

## 2018-02-18 DIAGNOSIS — F1210 Cannabis abuse, uncomplicated: Secondary | ICD-10-CM | POA: Diagnosis not present

## 2018-02-18 DIAGNOSIS — F209 Schizophrenia, unspecified: Secondary | ICD-10-CM | POA: Diagnosis present

## 2018-02-18 LAB — COMPREHENSIVE METABOLIC PANEL
ALT: 20 U/L (ref 17–63)
ANION GAP: 11 (ref 5–15)
AST: 31 U/L (ref 15–41)
Albumin: 4.1 g/dL (ref 3.5–5.0)
Alkaline Phosphatase: 57 U/L (ref 38–126)
BUN: 19 mg/dL (ref 6–20)
CALCIUM: 9.1 mg/dL (ref 8.9–10.3)
CHLORIDE: 101 mmol/L (ref 101–111)
CO2: 24 mmol/L (ref 22–32)
Creatinine, Ser: 1.03 mg/dL (ref 0.61–1.24)
GFR calc Af Amer: >60 mL/min (ref >60)
GFR calc non Af Amer: >60 mL/min (ref >60)
Glucose, Bld: 95 mg/dL (ref 65–99)
POTASSIUM: 4 mmol/L (ref 3.5–5.1)
SODIUM: 136 mmol/L (ref 135–145)
Total Bilirubin: 0.6 mg/dL (ref 0.3–1.2)
Total Protein: 7.7 g/dL (ref 6.5–8.1)

## 2018-02-18 LAB — RAPID URINE DRUG SCREEN, HOSP PERFORMED
AMPHETAMINES: NOT DETECTED
BARBITURATES: NOT DETECTED
Benzodiazepines: NOT DETECTED
Cocaine: POSITIVE — AB
OPIATES: NOT DETECTED
TETRAHYDROCANNABINOL: POSITIVE — AB

## 2018-02-18 LAB — CBC WITH DIFFERENTIAL/PLATELET
BASOS PCT: 0 %
Basophils Absolute: 0 10*3/uL (ref 0.0–0.1)
Eosinophils Absolute: 0 10*3/uL (ref 0.0–0.7)
Eosinophils Relative: 1 %
HEMATOCRIT: 43 % (ref 39.0–52.0)
HEMOGLOBIN: 15 g/dL (ref 13.0–17.0)
LYMPHS ABS: 1.9 10*3/uL (ref 0.7–4.0)
LYMPHS PCT: 23 %
MCH: 28.1 pg (ref 26.0–34.0)
MCHC: 34.9 g/dL (ref 30.0–36.0)
MCV: 80.7 fL (ref 78.0–100.0)
MONO ABS: 0.8 10*3/uL (ref 0.1–1.0)
MONOS PCT: 9 %
NEUTROS ABS: 5.8 10*3/uL (ref 1.7–7.7)
NEUTROS PCT: 67 %
Platelets: 220 10*3/uL (ref 150–400)
RBC: 5.33 MIL/uL (ref 4.22–5.81)
RDW: 12.9 % (ref 11.5–15.5)
WBC: 8.5 10*3/uL (ref 4.0–10.5)

## 2018-02-18 LAB — ETHANOL: Alcohol, Ethyl (B): <10 mg/dL (ref <10)

## 2018-02-18 MED ORDER — NICOTINE 21 MG/24HR TD PT24
21.0000 mg | MEDICATED_PATCH | Freq: Every day | TRANSDERMAL | Status: DC
  Administered 2018-02-18 – 2018-02-19 (×2): 21 mg via TRANSDERMAL
  Filled 2018-02-18 (×2): qty 1

## 2018-02-18 MED ORDER — TRAZODONE HCL 50 MG PO TABS: 50.0000 mg | ORAL_TABLET | Freq: Every evening | ORAL | Status: DC | PRN

## 2018-02-18 MED ORDER — SERTRALINE HCL 50 MG PO TABS
50.0000 mg | ORAL_TABLET | Freq: Every day | ORAL | Status: DC
  Administered 2018-02-18 – 2018-02-19 (×2): 50 mg via ORAL
  Filled 2018-02-18 (×2): qty 1

## 2018-02-18 MED ORDER — ALUM & MAG HYDROXIDE-SIMETH 200-200-20 MG/5ML PO SUSP: 30.0000 mL | Freq: Four times a day (QID) | ORAL | Status: DC | PRN

## 2018-02-18 MED ORDER — ACETAMINOPHEN 325 MG PO TABS: 650.0000 mg | ORAL_TABLET | ORAL | Status: DC | PRN

## 2018-02-18 MED ORDER — RISPERIDONE 2 MG PO TABS
2.0000 mg | ORAL_TABLET | ORAL | Status: DC
  Administered 2018-02-18 – 2018-02-19 (×2): 2 mg via ORAL
  Filled 2018-02-18 (×2): qty 1

## 2018-02-18 NOTE — ED Notes (Signed)
Report given to RN

## 2018-02-18 NOTE — ED Notes (Signed)
Pt brought to room 41 and promptly went to sleep.  Pt was startled when I went to assess him.  He didn't say anything  So I let him continue sleeping .  15 minute checks and video monitoring in place.

## 2018-02-18 NOTE — ED Notes (Signed)
Bed: HS92 Expected date:  Expected time:  Means of arrival:  Comments: Hold for SI in waiting room

## 2018-02-18 NOTE — ED Notes (Signed)
Affect flat, mood depressed, behavior appropriate. Pt stays in his room sleeping most of the time. Positive SI.
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
## 2018-02-18 NOTE — ED Triage Notes (Signed)
Patient is having Suicidal thoughts

## 2018-02-19 ENCOUNTER — Encounter (HOSPITAL_COMMUNITY): Payer: Self-pay | Admitting: Emergency Medicine

## 2018-02-19 ENCOUNTER — Emergency Department (HOSPITAL_COMMUNITY)
Admission: EM | Admit: 2018-02-19 | Discharge: 2018-02-20 | Disposition: A | Discharge status: Home / Self Care | Payer: Medicaid Other | Source: Home / Self Care | Attending: Emergency Medicine | Admitting: Emergency Medicine

## 2018-02-19 ENCOUNTER — Other Ambulatory Visit: Payer: Self-pay

## 2018-02-19 ENCOUNTER — Encounter (HOSPITAL_COMMUNITY): Payer: Self-pay | Admitting: *Deleted

## 2018-02-19 DIAGNOSIS — Z79899 Other long term (current) drug therapy: Secondary | ICD-10-CM

## 2018-02-19 DIAGNOSIS — F333 Major depressive disorder, recurrent, severe with psychotic symptoms: Secondary | ICD-10-CM

## 2018-02-19 DIAGNOSIS — Z818 Family history of other mental and behavioral disorders: Secondary | ICD-10-CM | POA: Diagnosis not present

## 2018-02-19 DIAGNOSIS — F17210 Nicotine dependence, cigarettes, uncomplicated: Secondary | ICD-10-CM

## 2018-02-19 DIAGNOSIS — Z8659 Personal history of other mental and behavioral disorders: Secondary | ICD-10-CM

## 2018-02-19 DIAGNOSIS — F1414 Cocaine abuse with cocaine-induced mood disorder: Secondary | ICD-10-CM | POA: Diagnosis present

## 2018-02-19 DIAGNOSIS — F1410 Cocaine abuse, uncomplicated: Secondary | ICD-10-CM | POA: Diagnosis present

## 2018-02-19 DIAGNOSIS — F99 Mental disorder, not otherwise specified: Secondary | ICD-10-CM

## 2018-02-19 DIAGNOSIS — F69 Unspecified disorder of adult personality and behavior: Secondary | ICD-10-CM

## 2018-02-19 MED ORDER — RISPERIDONE MICROSPHERES 25 MG IM SUSR
25.0000 mg | INTRAMUSCULAR | Status: DC
  Administered 2018-02-19: 25 mg via INTRAMUSCULAR
  Filled 2018-02-19: qty 2

## 2018-02-19 NOTE — ED Triage Notes (Signed)
Pt stated "[facility] brought me in because I'm having bad thoughts, seeing things and hearing voices.".
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
## 2018-02-19 NOTE — BH Assessment (Signed)
[facility] Assessment Progress Note  Per Waylan Boga, DNP, this pt does not require psychiatric hospitalization at this time.  Pt is to be discharged from [facility] with recommendation to continue treatment with [facility].  This has been included in pt's discharge instructions.  Pt's nurse, Caryl Pina, has been notified.  Jalene Mullet, [place] Triage Specialist [phone]

## 2018-02-19 NOTE — Consult Note (Addendum)
[facility] Consult   Reason for Consult:  Cocaine abuse with suicidal ideations Referring Physician:  EDP Patient Identification: Tyler White MRN:  [id number] Principal Diagnosis: MDD (major depressive disorder), recurrent, severe, with psychosis ([place]) Diagnosis:   Patient Active Problem List   Diagnosis Date Noted  . MDD (major depressive disorder), recurrent, severe, with psychosis ([place]) [F33.3] 11/26/2017    Priority: High  . Psychosis ([place]) [F29] 11/27/2017  . Cannabis abuse with psychotic disorder, with delusions ([place]) [F12.150] 11/26/2017    Total Time spent with patient: 45 minutes  Subjective:   Tyler White is a 22 y.o. male patient does not warrant admission.  HPI:  22 yo male who came to the ED after using cocaine and having suicidal ideations.  Today, he denies suicidal/homicidal ideations, hallucinations, and withdrawal symptoms.  He was started on Risperdal yesterday and agreed to an injectable today.  Rosie wanted to leave today as he felt he was stable, agreed to wait for any negative side effects prior to discharge.  Stable for discharge after cleared from reaction possibility.  Past Psychiatric History:  Depression, cannabis induced psychosis  Risk to Self: None Risk to Others: Homicidal Ideation: No Thoughts of Harm to Others: No Current Homicidal Intent: No Current Homicidal Plan: No Access to Homicidal Means: No History of harm to others?: No Assessment of Violence: None Noted Does patient have access to weapons?: No Criminal Charges Pending?: Yes Describe Pending Criminal Charges: assault on male; injury to personalty; poss stolen goods Does patient have a court date: Yes Court Date: 03/09/18 Prior Inpatient Therapy: Prior Inpatient Therapy: Yes Prior Therapy Dates: 2019 Prior Therapy Facilty/Provider(s): [facility] Reason for Treatment: Psychosis Prior Outpatient Therapy: Prior Outpatient Therapy: No Does patient have an ACCT  team?: No Does patient have Intensive In-House Services?  : No Does patient have [facility] services? : No Does patient have P4CC services?: No  Past Medical History:  Past Medical History:  Diagnosis Date  . Hernia, inguinal, right   . Inguinal hernia    right  . Psychiatric illness    History reviewed. No pertinent surgical history. Family History:  Family History  Problem Relation Age of Onset  . Psychiatric Illness Mother   . Hypertension Sister    Family Psychiatric  History: As stated above.  Social History:  Social History   Substance and Sexual Activity  Alcohol Use Yes   Comment: BAC was clear     Social History   Substance and Sexual Activity  Drug Use Yes  . Types: Marijuana   Comment: UDS clear    Social History   Socioeconomic History  . Marital status: Single    Spouse name: None  . Number of children: None  . Years of education: None  . Highest education level: None  Social Needs  . Financial resource strain: None  . Food insecurity - worry: None  . Food insecurity - inability: None  . Transportation needs - medical: None  . Transportation needs - non-medical: None  Occupational History  . None  Tobacco Use  . Smoking status: Current Every Day Smoker    Packs/day: 0.50    Years: 5.00    Pack years: 2.50    Types: Cigarettes  . Smokeless tobacco: Never Used  Substance and Sexual Activity  . Alcohol use: Yes    Comment: BAC was clear  . Drug use: Yes    Types: Marijuana    Comment: UDS clear  . Sexual activity: Yes  Birth control/protection: None  Other Topics Concern  . None  Social History Narrative   ** Merged History Encounter **       Additional Social History: N/A    Allergies:  No Known Allergies  Labs:  Results for orders placed or performed during the hospital encounter of 02/18/18 (from the past 48 hour(s))  Comprehensive metabolic panel     Status: None   Collection Time: 02/18/18  2:15 PM  Result Value Ref Range    Sodium 136 135 - 145 mmol/L   Potassium 4.0 3.5 - 5.1 mmol/L   Chloride 101 101 - 111 mmol/L   CO2 24 22 - 32 mmol/L   Glucose, Bld 95 65 - 99 mg/dL   BUN 19 6 - 20 mg/dL   Creatinine, Ser 1.03 0.61 - 1.24 mg/dL   Calcium 9.1 8.9 - 10.3 mg/dL   Total Protein 7.7 6.5 - 8.1 g/dL   Albumin 4.1 3.5 - 5.0 g/dL   AST 31 15 - 41 U/L   ALT 20 17 - 63 U/L   Alkaline Phosphatase 57 38 - 126 U/L   Total Bilirubin 0.6 0.3 - 1.2 mg/dL   GFR calc non Af Amer >60 >60 mL/min   GFR calc Af Amer >60 >60 mL/min    Comment: (NOTE) The eGFR has been calculated using the CKD EPI equation. This calculation has not been validated in all clinical situations. eGFR's persistently <60 mL/min signify possible Chronic Kidney Disease.    Anion gap 11 5 - 15    Comment: Performed at [facility], [place] [address], [place], [place] [address]  CBC with Differential     Status: None   Collection Time: 02/18/18  2:15 PM  Result Value Ref Range   WBC 8.5 4.0 - 10.5 K/uL   RBC 5.33 4.22 - 5.81 MIL/uL   Hemoglobin 15.0 13.0 - 17.0 g/dL   HCT 43.0 39.0 - 52.0 %   MCV 80.7 78.0 - 100.0 fL   MCH 28.1 26.0 - 34.0 pg   MCHC 34.9 30.0 - 36.0 g/dL   RDW 12.9 11.5 - 15.5 %   Platelets 220 150 - 400 K/uL   Neutrophils Relative % 67 %   Neutro Abs 5.8 1.7 - 7.7 K/uL   Lymphocytes Relative 23 %   Lymphs Abs 1.9 0.7 - 4.0 K/uL   Monocytes Relative 9 %   Monocytes Absolute 0.8 0.1 - 1.0 K/uL   Eosinophils Relative 1 %   Eosinophils Absolute 0.0 0.0 - 0.7 K/uL   Basophils Relative 0 %   Basophils Absolute 0.0 0.0 - 0.1 K/uL    Comment: Performed at [facility], [place] [address] [place], [place] [address]  Ethanol     Status: None   Collection Time: 02/18/18  2:15 PM  Result Value Ref Range   Alcohol, Ethyl (B) <10 <10 mg/dL    Comment:        LOWEST DETECTABLE LIMIT FOR SERUM ALCOHOL IS 10 mg/dL FOR MEDICAL PURPOSES ONLY Performed at [facility]  [address] [place], [place] [address]   Urine rapid drug screen (hosp performed)     Status: Abnormal   Collection Time: 02/18/18  3:16 PM  Result Value Ref Range   Opiates NONE DETECTED NONE DETECTED   Cocaine POSITIVE (A) NONE DETECTED   Benzodiazepines NONE DETECTED NONE DETECTED   Amphetamines NONE DETECTED NONE DETECTED   Tetrahydrocannabinol POSITIVE (A) NONE DETECTED   Barbiturates NONE  DETECTED NONE DETECTED    Comment: (NOTE) DRUG SCREEN FOR MEDICAL PURPOSES ONLY.  IF CONFIRMATION IS NEEDED FOR ANY PURPOSE, NOTIFY LAB WITHIN 5 DAYS. LOWEST DETECTABLE LIMITS FOR URINE DRUG SCREEN Drug Class                     Cutoff (ng/mL) Amphetamine and metabolites    1000 Barbiturate and metabolites    200 Benzodiazepine                 814 Tricyclics and metabolites     300 Opiates and metabolites        300 Cocaine and metabolites        300 THC                            50 Performed at [facility], [place] [address], [place], [place] [address]     Current Facility-Administered Medications  Medication Dose Route Frequency Provider Last Rate Last Dose  . acetaminophen (TYLENOL) tablet 650 mg  650 mg Oral Q4H PRN Daleen Bo, MD      . alum & mag hydroxide-simeth (MAALOX/MYLANTA) 200-200-20 MG/5ML suspension 30 mL  30 mL Oral Q6H PRN Daleen Bo, MD      . nicotine (NICODERM CQ - dosed in mg/24 hours) patch 21 mg  21 mg Transdermal Daily Daleen Bo, MD   21 mg at 02/19/18 0951  . risperiDONE (RISPERDAL) tablet 2 mg  2 mg Oral Zella Richer, MD   2 mg at 02/19/18 0950  . risperiDONE microspheres (RISPERDAL CONSTA) injection 25 mg  25 mg Intramuscular Q14 Days Patrecia Pour, NP      . sertraline (ZOLOFT) tablet 50 mg  50 mg Oral Daily Daleen Bo, MD   50 mg at 02/19/18 0951  . traZODone (DESYREL) tablet 50 mg  50 mg Oral QHS PRN Daleen Bo, MD       Current Outpatient Medications  Medication Sig Dispense Refill  . gabapentin  (NEURONTIN) 100 MG capsule Take 2 capsules (200 mg total) by mouth 2 (two) times daily. For agitation 120 capsule 0  . nicotine (NICODERM CQ - DOSED IN MG/24 HOURS) 21 mg/24hr patch Place 1 patch (21 mg total) onto the skin daily. (May purchase from over the counter at the pharmacy): For smoking cessation 28 patch 0  . risperiDONE (RISPERDAL) 2 MG tablet Take 1 tablet (2 mg) by mouth in the morning & 1 tablet (2 mg) at bedtime: For mood control 60 tablet 0  . sertraline (ZOLOFT) 50 MG tablet Take 1 tablet (50 mg total) by mouth daily. For depression 30 tablet 0  . traZODone (DESYREL) 50 MG tablet Take 1 tablet (50 mg total) by mouth at bedtime as needed for sleep. 30 tablet 0  . hydrOXYzine (ATARAX/VISTARIL) 25 MG tablet Take 1 tablet (25 mg total) by mouth 3 (three) times daily as needed for anxiety. (Patient not taking: Reported on 02/18/2018) 60 tablet 0  . ibuprofen (ADVIL,MOTRIN) 600 MG tablet Take 1 tablet (600 mg total) by mouth every 6 (six) hours as needed for moderate pain. (May purchase from over the counter at the pharmacy): For pain management (Patient not taking: Reported on 02/18/2018) 30 tablet 0    Musculoskeletal: Strength & Muscle Tone: within normal limits Gait & Station: normal Patient leans: N/A  Psychiatric Specialty Exam: Physical Exam  Nursing note and vitals reviewed. Constitutional: He is oriented to person,  place, and time. He appears well-developed and well-nourished.  HENT:  Head: Normocephalic and atraumatic.  Neck: Normal range of motion.  Respiratory: Effort normal.  Musculoskeletal: Normal range of motion.  Neurological: He is alert and oriented to person, place, and time.  Psychiatric: He has a normal mood and affect. His speech is normal and behavior is normal. Judgment and thought content normal. Cognition and memory are normal.    Review of Systems  Psychiatric/Behavioral: Positive for depression and substance abuse.  All other systems reviewed and are  negative.   Blood pressure 122/75, pulse 68, temperature 97.9 F (36.6 C), resp. rate 16, SpO2 99 %.There is no height or weight on file to calculate BMI.  General Appearance: Casual  Eye Contact:  Good  Speech:  Normal Rate  Volume:  Normal  Mood:  Euthymic  Affect:  Congruent  Thought Process:  Coherent and Descriptions of Associations: Intact  Orientation:  Full (Time, Place, and Person)  Thought Content:  WDL and Logical  Suicidal Thoughts:  No  Homicidal Thoughts:  No  Memory:  Immediate;   Good Recent;   Good Remote;   Good  Judgement:  Fair  Insight:  Fair  Psychomotor Activity:  Normal  Concentration:  Concentration: Good and Attention Span: Good  Recall:  Good  Fund of Knowledge:  Fair  Language:  Good  Akathisia:  No  Handed:  Right  AIMS (if indicated):   N/A  Assets:  Housing Leisure Time Physical Health Resilience Social Support  ADL's:  Intact  Cognition:  WNL  Sleep:   N/A     Treatment Plan Summary: Daily contact with patient to assess and evaluate symptoms and progress in treatment, Medication management and Plan cocaine abuse with cocaine induced mood disorder:  -Crisis stabilization -Medication management:  Started Risperdal 2 mg BID, Zoloft 50 mg daily for depression, Trazodone 50 mg PRN sleep at bedtime.  One time dose of risperidone 25 mg long acting injectable. -Individual and substance abuse counseling  Disposition: No evidence of imminent risk to self or others at present.    Waylan Boga, NP 02/19/2018 2:10 PM   Patient seen face-to-face for psychiatric evaluation, chart reviewed and case discussed with the physician extender and developed treatment plan. Reviewed the information documented and agree with the treatment plan.  Buford Dresser, DO 02/19/18 7:51 PM

## 2018-02-19 NOTE — ED Notes (Signed)
TWO PATIENT BELONGINGS BAGS IN LOCKER 48

## 2018-02-19 NOTE — ED Notes (Signed)
Patient reports SI with no plan and AH. Patient denies HI/AVH. Plan of care discussed. Encouragement and support provided and safety maintain. Q 15 min safety checks remain in place and video monitoring.

## 2018-02-19 NOTE — ED Notes (Signed)
Pt d/c home per MD order. Discharge summary reviewed with pt. Pt verbalizes understanding. Pt denies SI/HI/AVH. Personal property returned. Pt signed e-signature. Ambulatory off unit.

## 2018-02-19 NOTE — BHH Suicide Risk Assessment (Signed)
Suicide Risk Assessment  Discharge Assessment   [facility] Discharge Suicide Risk Assessment   Principal Problem: MDD (major depressive disorder), recurrent, severe, with psychosis ([place]) Discharge Diagnoses:  Patient Active Problem List   Diagnosis Date Noted  . Cocaine abuse ([place]) [F14.10] 02/19/2018    Priority: High  . MDD (major depressive disorder), recurrent, severe, with psychosis ([place]) [F33.3] 11/26/2017    Priority: High  . Psychosis ([place]) [F29] 11/27/2017  . Cannabis abuse with psychotic disorder, with delusions ([place]) [F12.150] 11/26/2017    Total Time spent with patient: 45 minutes  Musculoskeletal: Strength & Muscle Tone: within normal limits Gait & Station: normal Patient leans: N/A  Psychiatric Specialty Exam: Physical Exam  Constitutional: He is oriented to person, place, and time. He appears well-developed and well-nourished.  HENT:  Head: Normocephalic.  Neck: Normal range of motion.  Respiratory: Effort normal.  Musculoskeletal: Normal range of motion.  Neurological: He is alert and oriented to person, place, and time.  Psychiatric: His speech is normal and behavior is normal. Judgment and thought content normal. Cognition and memory are normal. He exhibits a depressed mood.    Review of Systems  Psychiatric/Behavioral: Positive for depression and substance abuse.  All other systems reviewed and are negative.   Blood pressure 122/75, pulse 68, temperature 97.9 F (36.6 C), resp. rate 16, SpO2 99 %.There is no height or weight on file to calculate BMI.  General Appearance: Casual  Eye Contact:  Good  Speech:  Normal Rate  Volume:  Normal  Mood:  Euthymic  Affect:  Congruent  Thought Process:  Coherent and Descriptions of Associations: Intact  Orientation:  Full (Time, Place, and Person)  Thought Content:  WDL and Logical  Suicidal Thoughts:  No  Homicidal Thoughts:  No  Memory:  Immediate;   Good Recent;   Good Remote;   Good  Judgement:  Fair   Insight:  Fair  Psychomotor Activity:  Normal  Concentration:  Concentration: Good and Attention Span: Good  Recall:  Good  Fund of Knowledge:  Fair  Language:  Good  Akathisia:  No  Handed:  Right  AIMS (if indicated):     Assets:  Housing Leisure Time Physical Health Resilience Social Support  ADL's:  Intact  Cognition:  WNL  Sleep:      Mental Status Per Nursing Assessment::   On Admission:   cocaine abuse with suicidal ideations  Demographic Factors:  Male and Adolescent or young adult  Loss Factors: NA  Historical Factors: NA  Risk Reduction Factors:   Sense of responsibility to family, Living with another person, especially a relative, Positive social support and Positive therapeutic relationship  Continued Clinical Symptoms:  None  Cognitive Features That Contribute To Risk:  None    Suicide Risk:  Minimal: No identifiable suicidal ideation.  Patients presenting with no risk factors but with morbid ruminations; may be classified as minimal risk based on the severity of the depressive symptoms    Plan Of Care/Follow-up recommendations:  Activity:  as tolerated Diet:  heart healthy diet  Tyler Mulvehill, NP 02/19/2018, 3:23 PM

## 2018-02-19 NOTE — Discharge Instructions (Signed)
For your behavioral health needs, you are advised to continue treatment with [facility]:       [facility]      [address], [place], [place] [address]      [phone]

## 2018-02-20 LAB — COMPREHENSIVE METABOLIC PANEL
ALBUMIN: 4.3 g/dL (ref 3.5–5.0)
ALT: 21 U/L (ref 17–63)
AST: 35 U/L (ref 15–41)
Alkaline Phosphatase: 60 U/L (ref 38–126)
Anion gap: 13 (ref 5–15)
BUN: 24 mg/dL — AB (ref 6–20)
CHLORIDE: 104 mmol/L (ref 101–111)
CO2: 22 mmol/L (ref 22–32)
CREATININE: 1.06 mg/dL (ref 0.61–1.24)
Calcium: 9.4 mg/dL (ref 8.9–10.3)
GFR calc Af Amer: >60 mL/min (ref >60)
GFR calc non Af Amer: >60 mL/min (ref >60)
GLUCOSE: 113 mg/dL — AB (ref 65–99)
Potassium: 3.6 mmol/L (ref 3.5–5.1)
Sodium: 139 mmol/L (ref 135–145)
Total Bilirubin: 0.5 mg/dL (ref 0.3–1.2)
Total Protein: 7.7 g/dL (ref 6.5–8.1)

## 2018-02-20 LAB — CBC
HEMATOCRIT: 42.6 % (ref 39.0–52.0)
HEMOGLOBIN: 15.5 g/dL (ref 13.0–17.0)
MCH: 28.9 pg (ref 26.0–34.0)
MCHC: 36.4 g/dL — ABNORMAL HIGH (ref 30.0–36.0)
MCV: 79.5 fL (ref 78.0–100.0)
PLATELETS: 229 10*3/uL (ref 150–400)
RBC: 5.36 MIL/uL (ref 4.22–5.81)
RDW: 12.7 % (ref 11.5–15.5)
WBC: 7.7 10*3/uL (ref 4.0–10.5)

## 2018-02-20 LAB — ACETAMINOPHEN LEVEL: Acetaminophen (Tylenol), Serum: <10 ug/mL — ABNORMAL LOW (ref 10–30)

## 2018-02-20 LAB — RAPID URINE DRUG SCREEN, HOSP PERFORMED
AMPHETAMINES: NOT DETECTED
BARBITURATES: NOT DETECTED
BENZODIAZEPINES: NOT DETECTED
Cocaine: POSITIVE — AB
Opiates: NOT DETECTED
TETRAHYDROCANNABINOL: POSITIVE — AB

## 2018-02-20 LAB — ETHANOL: Alcohol, Ethyl (B): <10 mg/dL (ref <10)

## 2018-02-20 LAB — SALICYLATE LEVEL: Salicylate Lvl: <7 mg/dL (ref 2.8–30.0)

## 2018-02-20 MED ORDER — ONDANSETRON HCL 4 MG PO TABS: 4.0000 mg | ORAL_TABLET | Freq: Three times a day (TID) | ORAL | Status: DC | PRN

## 2018-02-20 MED ORDER — ALUM & MAG HYDROXIDE-SIMETH 200-200-20 MG/5ML PO SUSP: 30.0000 mL | Freq: Four times a day (QID) | ORAL | Status: DC | PRN

## 2018-02-20 MED ORDER — NICOTINE 21 MG/24HR TD PT24: 21.0000 mg | MEDICATED_PATCH | Freq: Every day | TRANSDERMAL | Status: DC

## 2018-02-20 MED ORDER — ACETAMINOPHEN 325 MG PO TABS: 650.0000 mg | ORAL_TABLET | ORAL | Status: DC | PRN

## 2018-02-20 MED ORDER — ZOLPIDEM TARTRATE 5 MG PO TABS: 5.0000 mg | ORAL_TABLET | Freq: Every evening | ORAL | Status: DC | PRN

## 2018-02-20 NOTE — Progress Notes (Signed)
TTS went to provide the pt with OPT resources and explained that he does not meet criteria for inpt treatment. Pt then stated to this writer he wanted to go to [facility] for inpt admission because "I been there before and they kept me there for like a week. They have food and they have a cafeteria there and I was really trying to go there." TTS provided the pt with homeless shelter resources and food pantries in the area.  Lind Covert, MSW, LCSW Therapeutic Triage Specialist  [phone]

## 2018-02-20 NOTE — ED Provider Notes (Signed)
[facility] DEPT Provider Note   CSN: [id number] Arrival date & time: 02/19/18  2213     History   Chief Complaint Chief Complaint  Patient presents with  . Hallucinations  . Suicidal    HPI Tyler White is a 22 y.o. male.  Who presents the emergency department chief complaint of suicidal ideation patient states that he is hearing voices that are telling him to hurt himself.  He sometimes thinks about doing this but does not have an active plan of suicide.  He is also thinking about hurting other people.  He says that sometimes he sees people around him that other people do not see.  He uses powder cocaine about 4 times a month and is a daily smoker.  He denies other drug or alcohol use.  He has no other complaints at this time  HPI  Past Medical History:  Diagnosis Date  . Hernia, inguinal, right   . Inguinal hernia    right  . Psychiatric illness     Patient Active Problem List   Diagnosis Date Noted  . Cocaine abuse ([place]) 02/19/2018  . Cocaine abuse with cocaine-induced mood disorder ([place]) 02/19/2018  . Psychosis ([place]) 11/27/2017  . Cannabis abuse with psychotic disorder, with delusions ([place]) 11/26/2017  . MDD (major depressive disorder), recurrent, severe, with psychosis ([place]) 11/26/2017    History reviewed. No pertinent surgical history.     Home Medications    Prior to Admission medications   Medication Sig Start Date End Date Taking? Authorizing Provider  gabapentin (NEURONTIN) 100 MG capsule Take 2 capsules (200 mg total) by mouth 2 (two) times daily. For agitation 12/02/17  Yes Nwoko, Herbert Pun I, NP  nicotine (NICODERM CQ - DOSED IN MG/24 HOURS) 21 mg/24hr patch Place 1 patch (21 mg total) onto the skin daily. (May purchase from over the counter at the pharmacy): For smoking cessation 12/03/17  Yes Lindell Spar I, NP  risperiDONE (RISPERDAL) 2 MG tablet Take 1 tablet (2 mg) by mouth in the morning & 1 tablet (2 mg) at bedtime:  For mood control 12/02/17  Yes Lindell Spar I, NP  sertraline (ZOLOFT) 50 MG tablet Take 1 tablet (50 mg total) by mouth daily. For depression 12/03/17  Yes Lindell Spar I, NP  traZODone (DESYREL) 50 MG tablet Take 1 tablet (50 mg total) by mouth at bedtime as needed for sleep. 12/02/17  Yes Encarnacion Slates, NP    Family History Family History  Problem Relation Age of Onset  . Psychiatric Illness Mother   . Hypertension Sister     Social History Social History   Tobacco Use  . Smoking status: Current Every Day Smoker    Packs/day: 0.50    Years: 5.00    Pack years: 2.50    Types: Cigarettes  . Smokeless tobacco: Never Used  Substance Use Topics  . Alcohol use: Yes    Comment: BAC was clear  . Drug use: Yes    Types: Marijuana    Comment: UDS clear     Allergies   Patient has no known allergies.   Review of Systems Review of Systems Ten systems reviewed and are negative for acute change, except as noted in the HPI.   Physical Exam Updated Vital Signs BP 134/88 (BP Location: Left Arm)   Pulse (!) 106   Temp 98.6 F (37 C) (Oral)   Resp 18   Ht 5\' 7"  (1.702 m)   Wt 68 kg (150 lb)  SpO2 96%   BMI 23.49 kg/m   Physical Exam Physical Exam  Nursing note and vitals reviewed. Constitutional: He appears well-developed and well-nourished. No distress.  HENT:  Head: Normocephalic and atraumatic.  Eyes: Conjunctivae normal are normal. No scleral icterus.  Neck: Normal range of motion. Neck supple.  Cardiovascular: Normal rate, regular rhythm and normal heart sounds.   Pulmonary/Chest: Effort normal and breath sounds normal. No respiratory distress.  Abdominal: Soft. There is no tenderness.  Musculoskeletal: He exhibits no edema.  Neurological: He is alert.  Skin: Skin is warm and dry. He is not diaphoretic.  Psychiatric: His behavior is normal.     ED Treatments / Results  Labs (all labs ordered are listed, but only abnormal results are displayed) Labs  Reviewed  COMPREHENSIVE METABOLIC PANEL  ETHANOL  SALICYLATE LEVEL  ACETAMINOPHEN LEVEL  CBC  RAPID URINE DRUG SCREEN, HOSP PERFORMED    EKG  EKG Interpretation None       Radiology No results found.  Procedures Procedures (including critical care time)  Medications Ordered in ED Medications  acetaminophen (TYLENOL) tablet 650 mg (not administered)  zolpidem (AMBIEN) tablet 5 mg (not administered)  ondansetron (ZOFRAN) tablet 4 mg (not administered)  alum & mag hydroxide-simeth (MAALOX/MYLANTA) 200-200-20 MG/5ML suspension 30 mL (not administered)  nicotine (NICODERM CQ - dosed in mg/24 hours) patch 21 mg (not administered)     Initial Impression / Assessment and Plan / ED Course  I have reviewed the triage vital signs and the nursing notes.  Pertinent labs & imaging results that were available during my care of the patient were reviewed by me and considered in my medical decision making (see chart for details).  Clinical Course as of Feb 21 136  Fri Feb 20, 2018  0103 Also pos for [facility] Tetrahydrocannabinol: (!) POSITIVE [AH]    Clinical Course User Index [AH] Margarita Mail, PA-C    Cleared within last 24 hours for same complaint.  His symptoms were reviewed by TTS and psych medical provider.  They feel that he is safe for discharge.  I am in agreement.  He does not have an active plan of suicide or homicide.  He has not been very forthcoming with a lot of information.  Do not feel that he is an immediate threat to himself and appears appropriate for discharge at this time.  I discussed return precautions including outpatient follow-up.  Final Clinical Impressions(s) / ED Diagnoses   Final diagnoses:  Psychiatric complaint    ED Discharge Orders    None       Margarita Mail, PA-C 02/20/18 0138    M[place], [name], MD 02/20/18 [phone]
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
## 2018-02-20 NOTE — Progress Notes (Signed)
TTS consulted with Lindon Romp, NP who states the pt is psych cleared and stable for d/c. Upon review of the chart, pt was d/c from [facility] on 02/19/18 by EDP Faythe Dingwall, DO due to pt denying SI and HI. Pt returned to the ED several hours later on the same date expressing SI without a plan. EDP Margarita Mail, PA-C advised and in agreement with disposition. Nurse tech Asencion Partridge advised and states she will inform Margaretha Sheffield, Therapist, sports of the disposition. TTS provided pt with OPT resources for follow up.  Lind Covert, MSW, LCSW Therapeutic Triage Specialist  [phone]

## 2018-02-20 NOTE — ED Notes (Signed)
TTS assessment in progress. 

## 2018-05-26 ENCOUNTER — Other Ambulatory Visit: Payer: Self-pay

## 2018-05-26 ENCOUNTER — Encounter (HOSPITAL_COMMUNITY): Payer: Self-pay

## 2018-05-26 ENCOUNTER — Emergency Department (HOSPITAL_COMMUNITY)
Admission: EM | Admit: 2018-05-26 | Discharge: 2018-05-28 | Disposition: A | Discharge status: Other Acute Inpatient Hospital | Payer: Medicaid Other | Attending: Emergency Medicine | Admitting: Emergency Medicine

## 2018-05-26 DIAGNOSIS — R45851 Suicidal ideations: Secondary | ICD-10-CM | POA: Insufficient documentation

## 2018-05-26 DIAGNOSIS — Z79899 Other long term (current) drug therapy: Secondary | ICD-10-CM | POA: Insufficient documentation

## 2018-05-26 DIAGNOSIS — F332 Major depressive disorder, recurrent severe without psychotic features: Secondary | ICD-10-CM | POA: Insufficient documentation

## 2018-05-26 DIAGNOSIS — R440 Auditory hallucinations: Secondary | ICD-10-CM | POA: Insufficient documentation

## 2018-05-26 DIAGNOSIS — F17210 Nicotine dependence, cigarettes, uncomplicated: Secondary | ICD-10-CM | POA: Insufficient documentation

## 2018-05-26 LAB — CBC
HCT: 43.3 % (ref 39.0–52.0)
HEMOGLOBIN: 14.5 g/dL (ref 13.0–17.0)
MCH: 27.9 pg (ref 26.0–34.0)
MCHC: 33.5 g/dL (ref 30.0–36.0)
MCV: 83.4 fL (ref 78.0–100.0)
PLATELETS: 266 10*3/uL (ref 150–400)
RBC: 5.19 MIL/uL (ref 4.22–5.81)
RDW: 13.4 % (ref 11.5–15.5)
WBC: 8.5 10*3/uL (ref 4.0–10.5)

## 2018-05-26 NOTE — ED Triage Notes (Signed)
Patient brought here by [employer] after being called to a store for person acting mad.  Patient agreed to come to the hospital voluntarily.  Patient states he has a plan but did not divulge to this RN.  Stated he takes meds for behavior but has not taken them in a few days.  Stated he wanted to be admitted for this.  A&Ox4.

## 2018-05-27 LAB — COMPREHENSIVE METABOLIC PANEL
ALK PHOS: 55 U/L (ref 38–126)
ALT: 15 U/L — AB (ref 17–63)
AST: 24 U/L (ref 15–41)
Albumin: 4 g/dL (ref 3.5–5.0)
Anion gap: 11 (ref 5–15)
BUN: 11 mg/dL (ref 6–20)
CO2: 22 mmol/L (ref 22–32)
CREATININE: 0.91 mg/dL (ref 0.61–1.24)
Calcium: 9.5 mg/dL (ref 8.9–10.3)
Chloride: 108 mmol/L (ref 101–111)
Glucose, Bld: 106 mg/dL — ABNORMAL HIGH (ref 65–99)
Potassium: 3.6 mmol/L (ref 3.5–5.1)
Sodium: 141 mmol/L (ref 135–145)
Total Bilirubin: 0.7 mg/dL (ref 0.3–1.2)
Total Protein: 7.1 g/dL (ref 6.5–8.1)

## 2018-05-27 LAB — RAPID URINE DRUG SCREEN, HOSP PERFORMED
Amphetamines: NOT DETECTED
BARBITURATES: NOT DETECTED
BENZODIAZEPINES: NOT DETECTED
COCAINE: NOT DETECTED
Opiates: NOT DETECTED
TETRAHYDROCANNABINOL: POSITIVE — AB

## 2018-05-27 LAB — ETHANOL: ALCOHOL ETHYL (B): 142 mg/dL — AB (ref <10)

## 2018-05-27 LAB — SALICYLATE LEVEL

## 2018-05-27 LAB — ACETAMINOPHEN LEVEL: Acetaminophen (Tylenol), Serum: <10 ug/mL — ABNORMAL LOW (ref 10–30)

## 2018-05-27 MED ORDER — LORAZEPAM 1 MG PO TABS: 0.0000 mg | ORAL_TABLET | Freq: Two times a day (BID) | ORAL | Status: DC

## 2018-05-27 MED ORDER — NICOTINE 21 MG/24HR TD PT24: 21.0000 mg | MEDICATED_PATCH | Freq: Every day | TRANSDERMAL | Status: DC

## 2018-05-27 MED ORDER — THIAMINE HCL 100 MG/ML IJ SOLN: 100.0000 mg | Freq: Every day | INTRAMUSCULAR | Status: DC

## 2018-05-27 MED ORDER — ALUM & MAG HYDROXIDE-SIMETH 200-200-20 MG/5ML PO SUSP: 30.0000 mL | Freq: Four times a day (QID) | ORAL | Status: DC | PRN

## 2018-05-27 MED ORDER — LORAZEPAM 2 MG/ML IJ SOLN: 0.0000 mg | Freq: Four times a day (QID) | INTRAMUSCULAR | Status: DC

## 2018-05-27 MED ORDER — LORAZEPAM 2 MG/ML IJ SOLN: 0.0000 mg | Freq: Two times a day (BID) | INTRAMUSCULAR | Status: DC

## 2018-05-27 MED ORDER — LORAZEPAM 1 MG PO TABS
0.0000 mg | ORAL_TABLET | Freq: Four times a day (QID) | ORAL | Status: DC
  Administered 2018-05-27: 1 mg via ORAL
  Filled 2018-05-27: qty 1

## 2018-05-27 MED ORDER — ACETAMINOPHEN 325 MG PO TABS: 650.0000 mg | ORAL_TABLET | ORAL | Status: DC | PRN

## 2018-05-27 MED ORDER — VITAMIN B-1 100 MG PO TABS: 100.0000 mg | ORAL_TABLET | Freq: Every day | ORAL | Status: DC
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
## 2018-05-27 NOTE — ED Notes (Signed)
TTS in process 

## 2018-05-27 NOTE — BH Assessment (Addendum)
Tele Assessment Note   Patient Name: Tyler White MRN: [id number] Referring Physician: Abigail Butts, PA-C Location of Patient: [facility] Location of Provider: [name] who presents voluntarily to [facility] BIB [facility] reporting symptoms of bizarre behavior. Pt has a history of cannabis abuse, depressive disorder with psychotic features and cocaine abuse.  Pt reports that he stopped taking his medications.  Pt denies current suicidal ideation and denies having a plan although he reported suicidal ideation to the triage nurse. Pt denies past attempts. Pt acknowledges symptoms including: sadness, isolating, lack of motivation, irritability, negative outlook, difficulty concentrating, helplessness and hopelessness.  Pt denies homicidal ideation/ history of violence. Pt reports auditory hallucinations and denies visual hallucinations or other psychotic symptoms. Pt denies having any current stressors.   Pt lives alone and denies having any supports. Pt denies history of abuse and trauma. Pt denies family history of SI/MH/SA. Pt reports being unemployed. Pt has poorinsight and impaired judgment. Pt's memory is intact.  Legal history includes being on probation.  Pt denies OP history. IP history includes an admission ton [facility] in November 2018.  Pt reports alcohol and substance abuse or marijuana.  Pt is dressed in scrubs, alert, oriented x4 with normal speech and normal motor behavior. Eye contact is good. Pt's mood is apprehensive and affect is congruent with mood. Thought process is coherent and relevant. There is no indication pt is currently responding to internal stimuli or experiencing delusional thought content. Pt was cooperative throughout assessment. Pt is able to contract for safety outside the hospital.   Diagnosis: F32.2 Major depressive disorder, Single episode, Severe  Past Medical History:  Past Medical History:  Diagnosis  Date  . Hernia, inguinal, right   . Inguinal hernia    right  . Psychiatric illness     History reviewed. No pertinent surgical history.  Family History:  Family History  Problem Relation Age of Onset  . Psychiatric Illness Mother   . Hypertension Sister     Social History:  reports that he has been smoking cigarettes.  He has a 2.50 pack-year smoking history. He has never used smokeless tobacco. He reports that he drinks alcohol. He reports that he has current or past drug history. Drug: Marijuana.  Additional Social History:  Alcohol / Drug Use Pain Medications: See MAR Prescriptions: See MAR Over the Counter: See MAR History of alcohol / drug use?: Yes Substance #1 Name of Substance 1: Alcohol 1 - Age of First Use: 18 1 - Amount (size/oz): Various 1 - Frequency: Often 1 - Duration: Ongoing 1 - Last Use / Amount: Last night Substance #2 Name of Substance 2: Marijuana 2 - Age of First Use: Pt doens't remember 2 - Amount (size/oz): Pt's doens't know 2 - Frequency: Pt's doens't know 2 - Duration: Pt's doens't know 2 - Last Use / Amount: Pt doens't remember  CIWA: CIWA-Ar BP: 121/85 Pulse Rate: 88 Nausea and Vomiting: no nausea and no vomiting Tactile Disturbances: none Tremor: no tremor Auditory Disturbances: not present Paroxysmal Sweats: no sweat visible Visual Disturbances: not present Anxiety: three Headache, Fullness in Head: none present Agitation: two Orientation and Clouding of Sensorium: oriented and can do serial additions CIWA-Ar Total: 5 COWS:    Allergies: No Known Allergies  Home Medications:  (Not in a hospital admission)  OB/GYN Status:  No LMP for male patient.  General Assessment Data Location of Assessment: [facility] ED TTS Assessment: In system Is this a  Tele or Face-to-Face Assessment?: Tele Assessment Is this an Initial Assessment or a Re-assessment for this encounter?: Initial Assessment Marital status: Single Maiden name: NA Is  patient pregnant?: No Pregnancy Status: No Living Arrangements: Alone Can pt return to current living arrangement?: Yes Admission Status: Voluntary Is patient capable of signing voluntary admission?: Yes Referral Source: Self/Family/Friend Insurance type: Medicaid     Crisis Care Plan Living Arrangements: Alone Name of Psychiatrist: None Name of Therapist: None  Education Status Is patient currently in school?: No Is the patient employed, unemployed or receiving disability?: Unemployed  Risk to self with the past 6 months Suicidal Ideation: Yes-Currently Present Has patient been a risk to self within the past 6 months prior to admission? : Yes Suicidal Intent: Yes-Currently Present Has patient had any suicidal intent within the past 6 months prior to admission? : Yes Is patient at risk for suicide?: Yes Suicidal Plan?: Yes-Currently Present Has patient had any suicidal plan within the past 6 months prior to admission? : Yes Specify Current Suicidal Plan: Pt refused to tell his plan Access to Means: (UTA) What has been your use of drugs/alcohol within the last 12 months?: Pt has been using alcohol and marijuana Previous Attempts/Gestures: No How many times?: 0 Other Self Harm Risks: Pt denies Triggers for Past Attempts: (NA) Intentional Self Injurious Behavior: None Family Suicide History: No Recent stressful life event(s): (Pt denies) Persecutory voices/beliefs?: No Depression: Yes Depression Symptoms: Despondent Substance abuse history and/or treatment for substance abuse?: Yes Suicide prevention information given to non-admitted patients: Not applicable  Risk to Others within the past 6 months Homicidal Ideation: No Does patient have any lifetime risk of violence toward others beyond the six months prior to admission? : No Thoughts of Harm to Others: No Current Homicidal Intent: No Current Homicidal Plan: No Access to Homicidal Means: No Identified Victim: Pt  denies History of harm to others?: No Assessment of Violence: None Noted Violent Behavior Description: Pt denies Does patient have access to weapons?: No Criminal Charges Pending?: No Does patient have a court date: No Is patient on probation?: Yes  Psychosis Hallucinations: Auditory Delusions: None noted  Mental Status Report Appearance/Hygiene: In scrubs, Bizarre Eye Contact: Good Motor Activity: Freedom of movement Speech: Slow, Soft Level of Consciousness: Quiet/awake Mood: Apprehensive Affect: Apprehensive Anxiety Level: None Thought Processes: Relevant Judgement: Impaired Orientation: Person, Place, Time, Situation Obsessive Compulsive Thoughts/Behaviors: None  Cognitive Functioning Concentration: Normal Memory: Recent Intact, Remote Intact Is patient IDD: No Is patient DD?: No Insight: Poor Impulse Control: Poor Appetite: Fair Have you had any weight changes? : No Change Sleep: Decreased Total Hours of Sleep: 5 Vegetative Symptoms: None  ADLScreening [facility] Assessment Services) Patient's cognitive ability adequate to safely complete daily activities?: Yes Patient able to express need for assistance with ADLs?: Yes Independently performs ADLs?: Yes (appropriate for developmental age)  Prior Inpatient Therapy Prior Inpatient Therapy: Yes Prior Therapy Dates: November 2018 Prior Therapy Facilty/Provider(s): [facility] Reason for Treatment: Aggressive behavior  Prior Outpatient Therapy Prior Outpatient Therapy: No Does patient have an ACCT team?: No Does patient have Intensive In-House Services?  : No Does patient have [facility] services? : No Does patient have P4CC services?: No  ADL Screening (condition at time of admission) Patient's cognitive ability adequate to safely complete daily activities?: Yes Is the patient deaf or have difficulty hearing?: No Does the patient have difficulty seeing, even when wearing glasses/contacts?: No Does the patient have  difficulty concentrating, remembering, or making decisions?: No Patient able  to express need for assistance with ADLs?: Yes Does the patient have difficulty dressing or bathing?: No Independently performs ADLs?: Yes (appropriate for developmental age) Does the patient have difficulty walking or climbing stairs?: No Weakness of Legs: None Weakness of Arms/Hands: None  Home Assistive Devices/Equipment Home Assistive Devices/Equipment: None    Abuse/Neglect Assessment (Assessment to be complete while patient is alone) Abuse/Neglect Assessment Can Be Completed: Yes Physical Abuse: Denies Verbal Abuse: Denies Sexual Abuse: Denies Exploitation of patient/patient's resources: Denies Self-Neglect: Denies     Regulatory affairs officer (For Healthcare) Does Patient Have a Medical Advance Directive?: No Would patient like information on creating a medical advance directive?: No - Patient declined          Disposition: Gave clinical report to Lindon Romp, NP who stated pt meets criteria for inpatient psychiatric treatment.  Tori, RN and [facility] at [facility] stated there are no appropriate beds for the pt.  TTS to seek placement.  Notified Katie, Therapist, sports and of recommendation. Disposition Initial Assessment Completed for this Encounter: Yes Patient referred to: Other (Comment)(TTS to seek placement)  This service was provided via telemedicine using a 2-way, interactive audio and video technology.  Names of all persons participating in this telemedicine service and their role in this encounter. Name: Tyler White Role: Patient  Name: Abran Cantor, MS, [facility] Role: TTS Counselor  Name:  Role:   Name:  Role:    Abran Cantor, [place], [facility] Therapeutic Triage Specialist  Abran Cantor 05/27/2018 6:17 AM

## 2018-05-27 NOTE — ED Notes (Signed)
Pt. Reports wanting to leave because he does not want to wait any longer for placement. Pt. States " I came here because I had nowhere to go." Explained to pt. That I would have the PA come talk to him. Will continue to monitor.

## 2018-05-27 NOTE — ED Notes (Signed)
Patient was wanded by Security, cleared by security guard

## 2018-05-27 NOTE — ED Notes (Signed)
Pt c/o increased anxiety and agitation, requesting Risperdal, willing to take ativan to help

## 2018-05-27 NOTE — ED Notes (Signed)
Belongings inventoried and placed in locker 1. No belongings with security

## 2018-05-27 NOTE — ED Provider Notes (Signed)
Patient was seen for re-evaluation as he was asking nurse to be discharged. This provider spoke to the patient. Patient expresses concern because he was told he was being admitted to the [facility], but is still in the ED. Patient appears internally stimulated and exhibits thought blocking. Is witnessed to responding to internal stimuli. Unable to articulate why he is in the ED and events leading up to this visit. Patient is agreeable to staying in the ED and continuing treatment in [facility].  This provider attempted to contact TTS to determine bed status for pt. Nurse states there is not a bed available yet.  Romie Jumper, PA-C   Michole Lecuyer, [place], PA-C 05/27/18 2112    Orlie Dakin, MD 05/27/18 2352

## 2018-05-27 NOTE — ED Notes (Signed)
Regular Diet was ordered for Dinner. 

## 2018-05-27 NOTE — ED Notes (Signed)
Pt wanded by security. 

## 2018-05-27 NOTE — ED Notes (Signed)
Spoke with Tyler White at [facility] after TTS who recommends inpatient placement, no beds at this time will speak with social worker in the morning.

## 2018-05-27 NOTE — ED Notes (Signed)
PA at bedside.

## 2018-05-27 NOTE — ED Notes (Signed)
Regular diet lunch tray ordered 

## 2018-05-28 ENCOUNTER — Other Ambulatory Visit: Payer: Self-pay

## 2018-05-28 ENCOUNTER — Encounter (HOSPITAL_COMMUNITY): Payer: Self-pay | Admitting: *Deleted

## 2018-05-28 ENCOUNTER — Inpatient Hospital Stay (HOSPITAL_COMMUNITY)
Admission: AD | Admit: 2018-05-28 | Discharge: 2018-06-02 | DRG: 885 | Disposition: A | Discharge status: Home / Self Care | Payer: Medicaid Other | Source: Intra-hospital | Attending: Psychiatry | Admitting: Psychiatry

## 2018-05-28 DIAGNOSIS — G4700 Insomnia, unspecified: Secondary | ICD-10-CM | POA: Diagnosis present

## 2018-05-28 DIAGNOSIS — F419 Anxiety disorder, unspecified: Secondary | ICD-10-CM | POA: Diagnosis present

## 2018-05-28 DIAGNOSIS — F17210 Nicotine dependence, cigarettes, uncomplicated: Secondary | ICD-10-CM | POA: Diagnosis present

## 2018-05-28 DIAGNOSIS — J309 Allergic rhinitis, unspecified: Secondary | ICD-10-CM | POA: Diagnosis present

## 2018-05-28 DIAGNOSIS — R451 Restlessness and agitation: Secondary | ICD-10-CM | POA: Diagnosis not present

## 2018-05-28 DIAGNOSIS — F1290 Cannabis use, unspecified, uncomplicated: Secondary | ICD-10-CM | POA: Diagnosis present

## 2018-05-28 DIAGNOSIS — F329 Major depressive disorder, single episode, unspecified: Secondary | ICD-10-CM | POA: Diagnosis present

## 2018-05-28 DIAGNOSIS — E569 Vitamin deficiency, unspecified: Secondary | ICD-10-CM | POA: Diagnosis present

## 2018-05-28 DIAGNOSIS — F22 Delusional disorders: Secondary | ICD-10-CM | POA: Diagnosis present

## 2018-05-28 DIAGNOSIS — F209 Schizophrenia, unspecified: Secondary | ICD-10-CM | POA: Diagnosis present

## 2018-05-28 MED ORDER — LOPERAMIDE HCL 2 MG PO CAPS: 2.0000 mg | ORAL_CAPSULE | ORAL | Status: AC | PRN

## 2018-05-28 MED ORDER — HYDROXYZINE HCL 25 MG PO TABS
25.0000 mg | ORAL_TABLET | Freq: Four times a day (QID) | ORAL | Status: AC | PRN
  Administered 2018-05-28 – 2018-05-30 (×3): 25 mg via ORAL
  Filled 2018-05-28 (×4): qty 1

## 2018-05-28 MED ORDER — ADULT MULTIVITAMIN W/MINERALS CH
1.0000 | ORAL_TABLET | Freq: Every day | ORAL | Status: DC
  Administered 2018-05-29 – 2018-06-02 (×5): 1 via ORAL
  Filled 2018-05-28 (×7): qty 1

## 2018-05-28 MED ORDER — ONDANSETRON 4 MG PO TBDP: 4.0000 mg | ORAL_TABLET | Freq: Four times a day (QID) | ORAL | Status: AC | PRN

## 2018-05-28 MED ORDER — VITAMIN B-1 100 MG PO TABS
100.0000 mg | ORAL_TABLET | Freq: Every day | ORAL | Status: DC
  Administered 2018-05-29 – 2018-06-02 (×5): 100 mg via ORAL
  Filled 2018-05-28 (×7): qty 1

## 2018-05-28 MED ORDER — CHLORDIAZEPOXIDE HCL 25 MG PO CAPS
25.0000 mg | ORAL_CAPSULE | Freq: Four times a day (QID) | ORAL | Status: AC | PRN
  Administered 2018-05-28: 25 mg via ORAL
  Filled 2018-05-28: qty 1

## 2018-05-28 NOTE — ED Notes (Signed)
ED RN informed by [facility] that Pt will be going to 500 Bed 2.

## 2018-05-28 NOTE — Progress Notes (Addendum)
Pt accepted to [facility], Bed 500-2 Shuvon Rankin, NP is the accepting provider.  Dr. Nancy Fetter is the attending provider.  Call report to [place] @ [facility] Psych ED notified.   Pt is currently Voluntary.  PT MAY REQUIRE IVC IF HE REFUSES TO SIGN VOLUNTARY CONSENTS Pt may be transported by [facility]  Pt scheduled to arrive at [facility] @ 8PM (20:00)   Areatha Keas. Judi Cong, MSW, [place] Disposition Clinical Social Work [phone] (cell) [phone] (office)

## 2018-05-28 NOTE — ED Notes (Signed)
Pt verbalized understanding discharge instructions and denies any further needs or questions at this time. VS stable, ambulatory and steady gait.   

## 2018-05-28 NOTE — Tx Team (Signed)
Initial Treatment Plan 05/28/2018 8:41 PM Tyler White YQM:[id number]    PATIENT STRESSORS: Medication change or noncompliance Substance abuse   PATIENT STRENGTHS: Ability for insight Average or above average intelligence Capable of independent living General fund of knowledge   PATIENT IDENTIFIED PROBLEMS: Depression Suicidal thoughts "Get back on my medicine"                     DISCHARGE CRITERIA:  Ability to meet basic life and health needs Improved stabilization in mood, thinking, and/or behavior Verbal commitment to aftercare and medication compliance  PRELIMINARY DISCHARGE PLAN: Attend aftercare/continuing care group Return to previous living arrangement  PATIENT/FAMILY INVOLVEMENT: This treatment plan has been presented to and reviewed with the patient, Tyler White, and/or family member, .  The patient and family have been given the opportunity to ask questions and make suggestions.  Aiden Helzer, [place], [place] 05/28/2018, 8:41 PM

## 2018-05-28 NOTE — Progress Notes (Signed)
Tyler White is a 22 year old male pt admitted on voluntary basis. On admission, he reports that he did not take his medications when he got discharged from [facility] last time he was here. He does endorse depression and passive SI and spoke about how he feels that way sometimes at home but reports that he does not have any plan and is able to contract for safety while in the hospital. He denied any substance abuse issues however UDS was positive for marijuana and alcohol level was 146. He denied any auditory hallucinations on admission. He spoke about how he has not been sleeping well and spoke about how he is ok if he starts back on his medications while he is here. He reports that he lives with his father Tyler White and reports that he will go back there once he is discharged. He was oriented to the unit and safety maintained.

## 2018-05-28 NOTE — ED Notes (Signed)
Voluntary consent faxed to [facility]

## 2018-05-28 NOTE — ED Notes (Signed)
Tele psych re-assessing pt at this time

## 2018-05-28 NOTE — ED Notes (Addendum)
Pt requesting to use phone to call PO. Pt states "I need to leave and see my PO officer". Pt informed he cannot leave until [facility] has decided to discharge or admit to inpatient.  RN informed on name and number of PO if they needed to be reached. Pt has given consent for RN to call this number.  [place]

## 2018-05-28 NOTE — ED Notes (Signed)
Pt still has no bed at [facility]. This RN called and report for given to Nicole Kindred. RN to call back when bed becomes available.

## 2018-05-28 NOTE — ED Notes (Signed)
Romie Minus from [facility] informed this RN that Pt meets in patient criteria at [facility]. No bed has been assigned at this time. Pt should have bed ready at 2000.

## 2018-05-28 NOTE — ED Notes (Signed)
Pt received breakfast tray 

## 2018-05-28 NOTE — BH Assessment (Signed)
[place] Assessment Progress Note  Pt reassessed today.  Pt denies SI, HI, VH. Pt endorses AH "all the time", but says he doesn't listen to the voices. Pt reports taking medication, but then indicates that he hasn't had medication in 6 months. Pt is unable to explain why except to say that he thinks people have been taking his medication. Pt reports that he lives with his mother, who is unaware that he is in the hospital. Pt thinks his mother could have taken his medication b/c "she might need it".   It is noted that pt is not forthcoming with any information and clinician had to ask many probing questions to get the information detailed above. Pt clearly lacks insight into his situation and appears to be experiencing some confusion and thought blocking.  IP treatment will continue to be recommended for pt.   Kenna Gilbert. Lovena Le, [place], [place], LPCA Counselor

## 2018-05-28 NOTE — Progress Notes (Signed)
Report given by Dietrich Pates, RN. Pt appears anxious/paranoid in affect and mood. Pt appears to be thought-blocking. Pt denies SI/HI/AVH/Pain at this time. Pt was endorsing withdrawal s/s (anxiety and restlessness). PRN vistrail and librium offered and accepted. Will continue with POC.

## 2018-05-28 NOTE — ED Notes (Signed)
Lunch tray ordered for pt at this time Pt resting with lights off

## 2018-05-29 DIAGNOSIS — F419 Anxiety disorder, unspecified: Secondary | ICD-10-CM

## 2018-05-29 DIAGNOSIS — F17210 Nicotine dependence, cigarettes, uncomplicated: Secondary | ICD-10-CM

## 2018-05-29 DIAGNOSIS — F209 Schizophrenia, unspecified: Principal | ICD-10-CM

## 2018-05-29 MED ORDER — LORAZEPAM 1 MG PO TABS
1.0000 mg | ORAL_TABLET | ORAL | Status: AC | PRN
  Administered 2018-05-31: 1 mg via ORAL
  Filled 2018-05-29: qty 1

## 2018-05-29 MED ORDER — OLANZAPINE 10 MG PO TBDP: 10.0000 mg | ORAL_TABLET | Freq: Three times a day (TID) | ORAL | Status: DC | PRN

## 2018-05-29 MED ORDER — ZIPRASIDONE MESYLATE 20 MG IM SOLR: 20.0000 mg | INTRAMUSCULAR | Status: DC | PRN

## 2018-05-29 MED ORDER — PALIPERIDONE ER 6 MG PO TB24
6.0000 mg | ORAL_TABLET | Freq: Every day | ORAL | Status: DC
  Administered 2018-05-29 – 2018-06-02 (×5): 6 mg via ORAL
  Filled 2018-05-29 (×7): qty 1

## 2018-05-29 MED ORDER — PALIPERIDONE PALMITATE ER 234 MG/1.5ML IM SUSY
234.0000 mg | PREFILLED_SYRINGE | Freq: Once | INTRAMUSCULAR | Status: AC
  Administered 2018-05-29: 234 mg via INTRAMUSCULAR
  Filled 2018-05-29: qty 1.5

## 2018-05-29 MED ORDER — PALIPERIDONE PALMITATE ER 156 MG/ML IM SUSY
156.0000 mg | PREFILLED_SYRINGE | INTRAMUSCULAR | Status: DC
  Administered 2018-06-01: 156 mg via INTRAMUSCULAR
  Filled 2018-05-29: qty 1

## 2018-05-29 MED ORDER — TRAZODONE HCL 50 MG PO TABS
50.0000 mg | ORAL_TABLET | Freq: Every evening | ORAL | Status: DC | PRN
  Administered 2018-05-29 – 2018-05-31 (×3): 50 mg via ORAL
  Filled 2018-05-29 (×3): qty 1

## 2018-05-29 MED ORDER — PALIPERIDONE PALMITATE ER 156 MG/ML IM SUSY: 156.0000 mg | PREFILLED_SYRINGE | INTRAMUSCULAR | Status: DC

## 2018-05-29 NOTE — BHH Group Notes (Signed)
[facility] LCSW Group Therapy  05/29/2018  1:05 PM  Type of Therapy:  Group therapy  Participation Level:  Active  Participation Quality:  Attentive  Affect:  Flat  Cognitive:  Oriented  Insight:  Limited  Engagement in Therapy:  Limited  Modes of Intervention:  Discussion, Socialization  Summary of Progress/Problems:  Chaplain was here to lead a group on themes of hope and courage. "My sister and brother and my dad give me hope, but I don't see them much."  Left early, did not return.  Roque Lias B 05/29/2018 1:27 PM

## 2018-05-29 NOTE — Progress Notes (Signed)
Recreation Therapy Notes  INPATIENT RECREATION THERAPY ASSESSMENT  Patient Details Name: Tyler White MRN: [id number] DOB: 10-Sep-1996 Today's Date: 05/29/2018       Information Obtained From: Patient  Able to Participate in Assessment/Interview: Yes  Patient Presentation: Alert(Takes long pauses with each question)  Reason for Admission (Per Patient): Other (Comments)(Pt stated he felt something was wrong.)  Patient Stressors: Work, Family, Friends, Other (Comment)(Finances)  Coping Skills:   Building control surveyor, TV, Music, Talk, Avoidance, Read, [employer] Bath/Shower  Leisure Interests (2+):  Exercise - Walking, Individual - Other (Comment)(Smoke cigarettes)  Frequency of Recreation/Participation: Other (Comment)(Daily)  Awareness of Community Resources:  No  Expressed Interest in [place]: No  County of Residence:  [place]  Patient Main Form of Transportation: Walk  Patient Strengths:  Understanding; good heart  Patient Identified Areas of Improvement:  "I don't know"  Patient Goal for Hospitalization:  "Just chill"  Current SI (including self-harm):  No  Current HI:  No  Current AVH: No  Staff Intervention Plan: Group Attendance, Collaborate with Interdisciplinary Treatment Team  Consent to Intern Participation: N/A    Victorino Sparrow, LRT/CTRS   Victorino Sparrow A 05/29/2018, 12:38 PM

## 2018-05-29 NOTE — Progress Notes (Signed)
Writer has observed patient in and out of the dayroom this evening but no interaction with peers. He came to nursing station a few times before medication was due even after writer had spoken with him about the earliest he could receive it. He has litle to no conversation and shows signs of thought blocking. Support given and safety maintained on unit with 15 min checks.
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
## 2018-05-29 NOTE — H&P (Signed)
Psychiatric Admission Assessment Adult  Patient Identification: Tyler White MRN:  [id number] Date of Evaluation:  05/29/2018 Chief Complaint:  AH, paranoia Principal Diagnosis: Schizophrenia, unspecified ([place]) Diagnosis:   Patient Active Problem List   Diagnosis Date Noted  . Cocaine abuse ([place]) [F14.10] 02/19/2018  . Cocaine abuse with cocaine-induced mood disorder ([place]) [F14.14] 02/19/2018  . Psychosis ([place]) [F29] 11/27/2017  . Cannabis abuse with psychotic disorder, with delusions ([place]) [F12.150] 11/26/2017  . Schizophrenia, unspecified ([place]) [F20.9] 11/26/2017   History of Present Illness:   Tyler White is a 22 y/o M with history of treatment for psychosis and polysubstance abuse who was admitted voluntarily from [place] where he presented with worsening paranoia, thought blocking, disorganized behaviors, and AH. He has recent history of discharge from [facility] in 10/2017 and encounter in ED in 01/2018 with SI and CAH to kill others but he was discharged to outpatient care at that time. Pt was medically cleared and then transferred to [facility].  Upon initial interview, pt shares, "I got into an argument at the store. People were going into my pockets and trying to see what I had." Pt endorses paranoia of feeling followed and watched. Pt has significant thought blocking and increased latency during interview. He often appears internally preoccupied and has long latencies in his responses or does not respond at all. He shares that he was at [place] when this happened but he is vague in his narrative details. Pt endorses AH which command him to do things such as "walk to the store" and sometimes to hurt others but he has not acted on them. He contracts for safety while in the hospital. He denies SI/HI/VH. He reports that he has been sleeping poorly for at least the last 4 nights. He denies other symptoms of depression, bipolar mania, OCD, and PTSD. He reports using cannabis about "2 blunts  per day" and he drinks about 2-3 times per week about 1 forty ounce beer. He smokes about 0.5 pack per week of tobacco. He denies other substance use.  Discussed with patient about treatment options. He reports being off of his medications for several months. He is in agreement to be started on invega has he had tolerated risperdal well during his last admission. He is open to transitioning to long-acting injectable form of [place]. He had no further questions, comments, or concerns.   Associated Signs/Symptoms: Depression Symptoms:  depressed mood, hopelessness, anxiety, (Hypo) Manic Symptoms:  Delusions, Distractibility, Hallucinations, Impulsivity, Anxiety Symptoms:  Excessive Worry, Psychotic Symptoms:  Delusions, Hallucinations: Auditory Paranoia, PTSD Symptoms: NA Total Time spent with patient: 1 hour  Past Psychiatric History: - Previous dx of psychosis, unspecified and polysubstance abuse  - Inpt to [facility] [place] in 11/2017 - No current outpatient provider -Denies hx of suicide attempt   Is the patient at risk to self? Yes.    Has the patient been a risk to self in the past 6 months? Yes.    Has the patient been a risk to self within the distant past? Yes.    Is the patient a risk to others? Yes.    Has the patient been a risk to others in the past 6 months? Yes.    Has the patient been a risk to others within the distant past? Yes.     Prior Inpatient Therapy:   Prior Outpatient Therapy:    Alcohol Screening: 1. How often do you have a drink containing alcohol?: Monthly or less 2. How many drinks containing alcohol  do you have on a typical day when you are drinking?: 1 or 2 3. How often do you have six or more drinks on one occasion?: Never AUDIT-C Score: 1 4. How often during the last year have you found that you were not able to stop drinking once you had started?: Never 5. How often during the last year have you failed to do what was normally expected from you  becasue of drinking?: Never 6. How often during the last year have you needed a first drink in the morning to get yourself going after a heavy drinking session?: Never 7. How often during the last year have you had a feeling of guilt of remorse after drinking?: Never 8. How often during the last year have you been unable to remember what happened the night before because you had been drinking?: Never 9. Have you or someone else been injured as a result of your drinking?: No 10. Has a relative or friend or a doctor or another health worker been concerned about your drinking or suggested you cut down?: No Alcohol Use Disorder Identification Test Final Score (AUDIT): 1 Intervention/Follow-up: AUDIT Score <7 follow-up not indicated Substance Abuse History in the last 12 months:  Yes.   Consequences of Substance Abuse: Medical Consequences:  worsened psychosis Previous Psychotropic Medications: Yes  Psychological Evaluations: Yes  Past Medical History:  Past Medical History:  Diagnosis Date  . Hernia, inguinal, right   . Inguinal hernia    right  . Psychiatric illness    History reviewed. No pertinent surgical history. Family History:  Family History  Problem Relation Age of Onset  . Psychiatric Illness Mother   . Hypertension Sister    Family Psychiatric  History: Denies family psychiatric history Tobacco Screening: Have you used any form of tobacco in the last 30 days? (Cigarettes, Smokeless Tobacco, Cigars, and/or Pipes): Yes Tobacco use, Select all that apply: 5 or more cigarettes per day Are you interested in Tobacco Cessation Medications?: No, patient refused Counseled patient on smoking cessation including recognizing danger situations, developing coping skills and basic information about quitting provided: Refused/Declined practical counseling Social History:  Social History   Substance and Sexual Activity  Alcohol Use Yes     Social History   Substance and Sexual Activity   Drug Use Yes  . Types: Marijuana    Additional Social History:                           Allergies:  No Known Allergies Lab Results: No results found for this or any previous visit (from the past 48 hour(s)).  Blood Alcohol level:  Lab Results  Component Value Date   ETH 142 (H) 05/26/2018   ETH <10 53/66/4403    Metabolic Disorder Labs:  No results found for: HGBA1C, MPG No results found for: PROLACTIN No results found for: CHOL, TRIG, HDL, CHOLHDL, VLDL, LDLCALC  Current Medications: Current Facility-Administered Medications  Medication Dose Route Frequency Provider Last Rate Last Dose  . chlordiazePOXIDE (LIBRIUM) capsule 25 mg  25 mg Oral Q6H PRN Rankin, Shuvon B, NP   25 mg at 05/28/18 2151  . hydrOXYzine (ATARAX/VISTARIL) tablet 25 mg  25 mg Oral Q6H PRN Rankin, Shuvon B, NP   25 mg at 05/28/18 2125  . loperamide (IMODIUM) capsule 2-4 mg  2-4 mg Oral PRN Rankin, Shuvon B, NP      . OLANZapine zydis (ZYPREXA) disintegrating tablet 10 mg  10 mg Oral  Q8H PRN Pennelope Bracken, MD       And  . LORazepam (ATIVAN) tablet 1 mg  1 mg Oral PRN Pennelope Bracken, MD       And  . ziprasidone (GEODON) injection 20 mg  20 mg Intramuscular PRN Pennelope Bracken, MD      . multivitamin with minerals tablet 1 tablet  1 tablet Oral Daily Rankin, Shuvon B, NP   1 tablet at 05/29/18 0808  . ondansetron (ZOFRAN-ODT) disintegrating tablet 4 mg  4 mg Oral Q6H PRN Rankin, Shuvon B, NP      . [START ON 06/26/2018] paliperidone (INVEGA SUSTENNA) injection 156 mg  156 mg Intramuscular Q28 days Maris Berger T, MD      . paliperidone (INVEGA) 24 hr tablet 6 mg  6 mg Oral Daily Pennelope Bracken, MD   6 mg at 05/29/18 1155  . thiamine (VITAMIN B-1) tablet 100 mg  100 mg Oral Daily Rankin, Shuvon B, NP   100 mg at 05/29/18 0808  . traZODone (DESYREL) tablet 50 mg  50 mg Oral QHS PRN Pennelope Bracken, MD       PTA Medications: Medications Prior  to Admission  Medication Sig Dispense Refill Last Dose  . gabapentin (NEURONTIN) 100 MG capsule Take 2 capsules (200 mg total) by mouth 2 (two) times daily. For agitation (Patient not taking: Reported on 05/27/2018) 120 capsule 0 Not Taking at Unknown time  . nicotine (NICODERM CQ - DOSED IN MG/24 HOURS) 21 mg/24hr patch Place 1 patch (21 mg total) onto the skin daily. (May purchase from over the counter at the pharmacy): For smoking cessation (Patient not taking: Reported on 05/27/2018) 28 patch 0 Not Taking at Unknown time  . risperiDONE (RISPERDAL) 2 MG tablet Take 1 tablet (2 mg) by mouth in the morning & 1 tablet (2 mg) at bedtime: For mood control (Patient not taking: Reported on 05/27/2018) 60 tablet 0 Not Taking at Unknown time  . sertraline (ZOLOFT) 50 MG tablet Take 1 tablet (50 mg total) by mouth daily. For depression (Patient not taking: Reported on 05/27/2018) 30 tablet 0 Not Taking at Unknown time  . traZODone (DESYREL) 50 MG tablet Take 1 tablet (50 mg total) by mouth at bedtime as needed for sleep. (Patient not taking: Reported on 05/27/2018) 30 tablet 0 Not Taking at Unknown time    Musculoskeletal: Strength & Muscle Tone: within normal limits Gait & Station: normal Patient leans: N/A  Psychiatric Specialty Exam: Physical Exam  Nursing note and vitals reviewed.   Review of Systems  Constitutional: Negative for chills and fever.  Respiratory: Negative for cough and shortness of breath.   Cardiovascular: Negative for chest pain.  Gastrointestinal: Negative for abdominal pain, heartburn, nausea and vomiting.  Psychiatric/Behavioral: Positive for depression and hallucinations. Negative for suicidal ideas. The patient is nervous/anxious. The patient does not have insomnia.     Blood pressure 137/81, pulse 89, temperature 98.1 F (36.7 C), temperature source Oral, resp. rate 16, height 5\' 9"  (1.753 m), weight 69.4 kg (153 lb).Body mass index is 22.59 kg/m.  General Appearance:  Casual and Fairly Groomed  Eye Contact:  Fair  Speech:  Blocked  Volume:  Decreased  Mood:  Anxious  Affect:  Flat  Thought Process:  Disorganized  Orientation:  Full (Time, Place, and Person)  Thought Content:  Delusions, Hallucinations: Auditory and Paranoid Ideation  Suicidal Thoughts:  No  Homicidal Thoughts:  No  Memory:  Immediate;   Fair Recent;  Fair Remote;   Fair  Judgement:  Poor  Insight:  Lacking  Psychomotor Activity:  Normal  Concentration:  Concentration: Fair  Recall:  [employer] of Knowledge:  Fair  Language:  Fair  Akathisia:  No  Handed:    AIMS (if indicated):     Assets:  Resilience Social Support  ADL's:  Intact  Cognition:  WNL  Sleep:  Number of Hours: 5.5   Treatment Plan Summary: Daily contact with patient to assess and evaluate symptoms and progress in treatment and Medication management  Observation Level/Precautions:  15 minute checks  Laboratory:  CBC Chemistry Profile HbAIC UDS UA  Psychotherapy:  Encourage participation in groups and therapeutic milieu   Medications:  Start invega 6mg  po qDay. Start Invega Sustenna 234mg  IM once today and plan for Sustenna 156mg  IM q28 days starting on 6/3. Continue atarax, trazodone, and agitation protocol.  Consultations:    Discharge Concerns:    Estimated LOS: 5-7 days  Other:     Physician Treatment Plan for Primary Diagnosis: Schizophrenia, unspecified ([place]) Long Term Goal(s): Improvement in symptoms so as ready for discharge  Short Term Goals: Ability to demonstrate self-control will improve  Physician Treatment Plan for Secondary Diagnosis: Principal Problem:   Schizophrenia, unspecified ([place])  Long Term Goal(s): Improvement in symptoms so as ready for discharge  Short Term Goals: Ability to identify and develop effective coping behaviors will improve  I certify that inpatient services furnished can reasonably be expected to improve the patient's condition.    Pennelope Bracken, MD 5/31/20191:32 PM

## 2018-05-29 NOTE — Progress Notes (Signed)
Adult Psychoeducational Group Note  Date:  05/29/2018 Time:  8:35 PM  Group Topic/Focus:  Wrap-Up Group:   The focus of this group is to help patients review their daily goal of treatment and discuss progress on daily workbooks.  Participation Level:  Active  Participation Quality:  Appropriate  Affect:  Appropriate  Cognitive:  Appropriate  Insight: Appropriate  Engagement in Group:  Engaged  Modes of Intervention:  Discussion  Additional Comments:  The patient expressed that he rates today a 10.The patient also said that his goal is to feel better.  Nash Shearer 05/29/2018, 8:35 PM

## 2018-05-29 NOTE — BHH Suicide Risk Assessment (Signed)
[place] INPATIENT:  Family/Significant Other Suicide Prevention Education  Suicide Prevention Education:  Education Completed Tyler White, mother, [phone] ;  has been identified by the patient as the family member/significant other with whom the patient will be residing, and identified as the person(s) who will aid the patient in the event of a mental health crisis (suicidal ideations/suicide attempt).  With written consent from the patient, the family member/significant other has been provided the following suicide prevention education, prior to the and/or following the discharge of the patient.  The suicide prevention education provided includes the following:  Suicide risk factors  Suicide prevention and interventions  National Suicide Hotline telephone number  [facility] assessment telephone number  [facility] Emergency Assistance [place] and/or Residential Mobile Crisis Unit telephone number  Request made of family/significant other to:  Remove weapons (e.g., guns, rifles, knives), all items previously/currently identified as safety concern.    Remove drugs/medications (over-the-counter, prescriptions, illicit drugs), all items previously/currently identified as a safety concern.  The family member/significant other verbalizes understanding of the suicide prevention education information provided.  The family member/significant other agrees to remove the items of safety concern listed above. Mother was successful in getting MCD for pt., will owrk on disability next.  Frustrated that he chooses not to take nmeds, glad to hear we will be giving him an injection.  Also hopeful that [facility] TCT will be helpful. [place] 05/29/2018, 11:08 AM

## 2018-05-29 NOTE — Plan of Care (Signed)
  Problem: Health Behavior/Discharge Planning: Goal: Compliance with prescribed medication regimen will improve Outcome: Progressing   Problem: Nutritional: Goal: Ability to achieve adequate nutritional intake will improve Outcome: Progressing   Problem: Safety: Goal: Ability to remain free from injury will improve Outcome: Progressing  DAR NOTE: Patient presents with guarded affect and paranoid mood.  Appears to be internally preoccupied with period of thought-blocking.  Denies suicidal thoughts, pain, auditory and visual hallucinations.  Described energy level as low and concentration as good.  Rates depression at 0, hopelessness at 0, and anxiety at 0.  Maintained on routine safety checks.  Medications given as prescribed.  Support and encouragement offered as needed.  Attended group and participated.  States goal for today is "rest."  Patient visible in dayroom with no interaction.  Patient safe on and off the unit.  Kirt Boys given.  No adverse reaction noted.

## 2018-05-29 NOTE — Progress Notes (Signed)
Recreation Therapy Notes  Date: 5.31.19 Time: 1000 Location: 500 Hall Dayroom  Group Topic: Wellness  Goal Area(s) Addresses:  Patient will define components of whole wellness. Patient will verbalize benefit of whole wellness.  Behavioral Response: Engaged  Intervention:  Music  Activity:  Exercise.  LRT let each patient pick an exercise(ie toe touches, jumping jacks, toe raises)  to lead the group through.  Patients completed four rounds of exercises.  Education: Wellness, Dentist.   Education Outcome: Acknowledges education/In group clarification offered/Needs additional education.   Clinical Observations/Feedback: Pt was quiet but active.  Pt was flat but attentive.    Victorino Sparrow, LRT/CTRS         Victorino Sparrow A 05/29/2018 11:38 AM

## 2018-05-29 NOTE — BHH Suicide Risk Assessment (Signed)
[facility] Admission Suicide Risk Assessment   Nursing information obtained from:  Patient Demographic factors:  Male, Adolescent or young adult, Low socioeconomic status, Unemployed Current Mental Status:  Suicidal ideation indicated by patient, Self-harm thoughts Loss Factors:  Financial problems / change in socioeconomic status Historical Factors:  Family history of mental illness or substance abuse Risk Reduction Factors:  Living with another person, especially a relative  Total Time spent with patient: 1 hour Principal Problem: Schizophrenia, unspecified ([place]) Diagnosis:   Patient Active Problem List   Diagnosis Date Noted  . Cocaine abuse ([place]) [F14.10] 02/19/2018  . Cocaine abuse with cocaine-induced mood disorder ([place]) [F14.14] 02/19/2018  . Psychosis ([place]) [F29] 11/27/2017  . Cannabis abuse with psychotic disorder, with delusions ([place]) [F12.150] 11/26/2017  . Schizophrenia, unspecified ([place]) [F20.9] 11/26/2017   Subjective Data: See H&P for details  Continued Clinical Symptoms:  Alcohol Use Disorder Identification Test Final Score (AUDIT): 1 The "Alcohol Use Disorders Identification Test", Guidelines for Use in Primary Care, Second Edition.  World Pharmacologist [facility]). Score between 0-7:  no or low risk or alcohol related problems. Score between 8-15:  moderate risk of alcohol related problems. Score between 16-19:  high risk of alcohol related problems. Score 20 or above:  warrants further diagnostic evaluation for alcohol dependence and treatment.   CLINICAL FACTORS:   Severe Anxiety and/or Agitation Alcohol/Substance Abuse/Dependencies Schizophrenia:   Paranoid or undifferentiated type     Psychiatric Specialty Exam: Physical Exam  Nursing note and vitals reviewed.   ROS- See H&P for details  Blood pressure 137/81, pulse 89, temperature 98.1 F (36.7 C), temperature source Oral, resp. rate 16, height 5\' 9"  (1.753 m), weight 69.4 kg (153 lb).Body mass index is  22.59 kg/m.    COGNITIVE FEATURES THAT CONTRIBUTE TO RISK:  None    SUICIDE RISK:   Minimal: No identifiable suicidal ideation.  Patients presenting with no risk factors but with morbid ruminations; may be classified as minimal risk based on the severity of the depressive symptoms  PLAN OF CARE: See H&P for details  I certify that inpatient services furnished can reasonably be expected to improve the patient's condition.   Pennelope Bracken, MD 05/29/2018, 2:01 PM

## 2018-05-30 LAB — LIPID PANEL
Cholesterol: 172 mg/dL (ref 0–200)
HDL: 50 mg/dL (ref >40)
LDL CALC: 107 mg/dL — AB (ref 0–99)
TRIGLYCERIDES: 76 mg/dL (ref <150)
Total CHOL/HDL Ratio: 3.4 RATIO
VLDL: 15 mg/dL (ref 0–40)

## 2018-05-30 LAB — HEMOGLOBIN A1C
Hgb A1c MFr Bld: 4.6 % — ABNORMAL LOW (ref 4.8–5.6)
Mean Plasma Glucose: 85.32 mg/dL

## 2018-05-30 LAB — TSH: TSH: 1.698 u[IU]/mL (ref 0.350–4.500)

## 2018-05-30 NOTE — BHH Group Notes (Signed)
  BHH/[facility] LCSW Group Therapy Note  Date/Time:  05/30/2018 11:15AM-12:00PM  Type of Therapy and Topic:  Group Therapy:  Feelings About Hospitalization  Participation Level:  Active   Description of Group This process group involved patients discussing their feelings related to being hospitalized, as well as the benefits they see to being in the hospital.  These feelings and benefits were itemized.  The group then brainstormed specific ways in which they could seek those same benefits when they discharge and return home.  Therapeutic Goals 1. Patient will identify and describe positive and negative feelings related to hospitalization 2. Patient will verbalize benefits of hospitalization to themselves personally 3. Patients will brainstorm together ways they can obtain similar benefits in the outpatient setting, identify barriers to wellness and possible solutions  Summary of Patient Progress:  The patient expressed his primary feelings about being hospitalized are "I like the time outside and getting help."  He was rocking back and forth constantly throughout group.  He stated to stay well after discharge he needs to focus on one goal and stay away from "places," although he could not define what that meant.  He was encouraged to pursue his aftercare plan at discharge as a means of staying well and out of the hospital.  Therapeutic Modalities Cognitive [place], LCSW 05/30/2018, 12:55 PM

## 2018-05-30 NOTE — BHH Group Notes (Signed)
[place] Group Notes:  (Nursing/MHT/Case Management/Adjunct)  Date:  05/30/2018  Time:  10:31 AM  Type of Therapy:  Psychoeducational Skills  Participation Level:  Did Not Attend      Coralyn Mark Zebulin Siegel 05/30/2018, 10:31 AM

## 2018-05-30 NOTE — Progress Notes (Signed)
Adult Psychoeducational Group Note  Date:  05/30/2018 Time:  9:07 PM  Group Topic/Focus:  Wrap-Up Group:   The focus of this group is to help patients review their daily goal of treatment and discuss progress on daily workbooks.  Participation Level:  Active  Participation Quality:  Attentive  Affect:  Appropriate  Cognitive:  Alert  Insight: Appropriate  Engagement in Group:  Engaged  Modes of Intervention:  Discussion  Additional Comments:  Patient reports that his day was alright, but he slept through the day. Patient informed tech that he did not get to go out because he was sleeping so he did not meet his goal. Patient reports that he attended group.  Doyle Askew 05/30/2018, 9:07 PM

## 2018-05-30 NOTE — Progress Notes (Signed)
Writer observed patient lying in bed asleep  at shift change. Writer encouraged him to get up if possible so that he will be able to rest tonight. Patient attended group and ate a snack before taking medication to aid with sleep. He requested to sleep in the quiet room because his roommate can be very loud and inappropriately laughing at times in his room. He stayed in the quiet room for a few hours before returning to his room because he reported that it was too cold in there. Patient does not engage in conversation and only answers yes or no. Support given and safety maintained on unit with 15 min checks.

## 2018-05-30 NOTE — Plan of Care (Signed)
  Problem: Safety: Goal: Periods of time without injury will increase Outcome: Progressing   Problem: Health Behavior/Discharge Planning: Goal: Compliance with prescribed medication regimen will improve Outcome: Progressing   Problem: Safety: Goal: Ability to remain free from injury will improve Outcome: Progressing   Problem: Self-Concept: Goal: Will verbalize positive feelings about self Outcome: Progressing  DAR NOTE: Patient presents with flat affect and depressed mood.  Denies pain, auditory and visual hallucinations.  Rates depression at 0, hopelessness at 0, and anxiety at 0.  Maintained on routine safety checks.  Medications given as prescribed.  Support and encouragement offered as needed.  Attended group and participated.  States goal for today is "exercise."  Patient visible in milieu for therapy with minimal interaction.  Offered no complaint.

## 2018-05-30 NOTE — Progress Notes (Signed)
[facility] MD Progress Note  05/30/2018 10:57 AM Tyler White  MRN:  [id number]  Subjective:  Tyler White seen resting in bedroom.  Patient presents flat guarded and paranoid.  Patient appears to be thought blocking with responses.  Denies suicidal or homicidal ideation he is denies depression or depressive symptoms during this assessment.  Denies auditory or visual hallucinations.  Reports a good appetite and states he is resting well throughout the night.  Patient is minimal with responses. reports taking medications as prescribed tolerating them well. Chart was reviewed  Next Invega 153mg   injection due 06/01/2018.  Patient positive for marijuana.  Support and encouragement and reassurance was provided.[name]  Histroy:Tyler White is a 22 y/o M with history of treatment for psychosis and polysubstance abuse who [facility] where he presented with worsening paranoia, thought blocking, disorganized behaviors, and AH. He has recent history of discharge from [facility] in 10/2017 and encounter in ED in 01/2018 with SI and CAH to kill others but he was discharged to outpatient care at that time. Pt was medically cleared and then transferred to [facility].   Principal Problem: Schizophrenia, unspecified ([place]) Diagnosis:   Patient Active Problem List   Diagnosis Date Noted  . Cocaine abuse ([place]) [F14.10] 02/19/2018  . Cocaine abuse with cocaine-induced mood disorder ([place]) [F14.14] 02/19/2018  . Psychosis ([place]) [F29] 11/27/2017  . Cannabis abuse with psychotic disorder, with delusions ([place]) [F12.150] 11/26/2017  . Schizophrenia, unspecified ([place]) [F20.9] 11/26/2017   Total Time spent with patient: 20 minutes  Past Psychiatric History:  Past Medical History:  Past Medical History:  Diagnosis Date  . Hernia, inguinal, right   . Inguinal hernia    right  . Psychiatric illness    History reviewed. No pertinent surgical history. Family History:  Family History  Problem Relation Age of Onset  .  Psychiatric Illness Mother   . Hypertension Sister    Family Psychiatric  History:  Social History:  Social History   Substance and Sexual Activity  Alcohol Use Yes     Social History   Substance and Sexual Activity  Drug Use Yes  . Types: Marijuana    Social History   Socioeconomic History  . Marital status: Single    Spouse name: Not on file  . Number of children: Not on file  . Years of education: Not on file  . Highest education level: Not on file  Occupational History  . Not on file  Social Needs  . Financial resource strain: Not on file  . Food insecurity:    Worry: Not on file    Inability: Not on file  . Transportation needs:    Medical: Not on file    Non-medical: Not on file  Tobacco Use  . Smoking status: Current Every Day Smoker    Packs/day: 0.50    Years: 5.00    Pack years: 2.50    Types: Cigarettes  . Smokeless tobacco: Never Used  Substance and Sexual Activity  . Alcohol use: Yes  . Drug use: Yes    Types: Marijuana  . Sexual activity: Yes    Birth control/protection: None  Lifestyle  . Physical activity:    Days per week: Not on file    Minutes per session: Not on file  . Stress: Not on file  Relationships  . Social connections:    Talks on phone: Not on file    Gets together: Not on file    Attends religious service: Not on file  Active member of club or organization: Not on file    Attends meetings of clubs or organizations: Not on file    Relationship status: Not on file  Other Topics Concern  . Not on file  Social History Narrative   ** Merged History Encounter **       Additional Social History:                         Sleep: Fair  Appetite:  Fair  Current Medications: Current Facility-Administered Medications  Medication Dose Route Frequency Provider Last Rate Last Dose  . chlordiazePOXIDE (LIBRIUM) capsule 25 mg  25 mg Oral Q6H PRN Rankin, Shuvon B, NP   25 mg at 05/28/18 2151  . hydrOXYzine  (ATARAX/VISTARIL) tablet 25 mg  25 mg Oral Q6H PRN Rankin, Shuvon B, NP   25 mg at 05/29/18 2058  . loperamide (IMODIUM) capsule 2-4 mg  2-4 mg Oral PRN Rankin, Shuvon B, NP      . OLANZapine zydis (ZYPREXA) disintegrating tablet 10 mg  10 mg Oral Q8H PRN Pennelope Bracken, MD       And  . LORazepam (ATIVAN) tablet 1 mg  1 mg Oral PRN Pennelope Bracken, MD       And  . ziprasidone (GEODON) injection 20 mg  20 mg Intramuscular PRN Pennelope Bracken, MD      . multivitamin with minerals tablet 1 tablet  1 tablet Oral Daily Rankin, Shuvon B, NP   1 tablet at 05/30/18 0826  . ondansetron (ZOFRAN-ODT) disintegrating tablet 4 mg  4 mg Oral Q6H PRN Rankin, Shuvon B, NP      . [START ON 06/01/2018] paliperidone (INVEGA SUSTENNA) injection 156 mg  156 mg Intramuscular Q28 days Maris Berger T, MD      . paliperidone (INVEGA) 24 hr tablet 6 mg  6 mg Oral Daily Pennelope Bracken, MD   6 mg at 05/30/18 0826  . thiamine (VITAMIN B-1) tablet 100 mg  100 mg Oral Daily Rankin, Shuvon B, NP   100 mg at 05/30/18 0826  . traZODone (DESYREL) tablet 50 mg  50 mg Oral QHS PRN Pennelope Bracken, MD   50 mg at 05/29/18 2058    Lab Results:  Results for orders placed or performed during the hospital encounter of 05/28/18 (from the past 48 hour(s))  TSH     Status: None   Collection Time: 05/30/18  6:27 AM  Result Value Ref Range   TSH 1.698 0.350 - 4.500 uIU/mL    Comment: Performed by a 3rd Generation assay with a functional sensitivity of <=0.01 uIU/mL. Performed at [facility], [place] [address], [place], [place] [address]   Lipid panel     Status: Abnormal   Collection Time: 05/30/18  6:27 AM  Result Value Ref Range   Cholesterol 172 0 - 200 mg/dL   Triglycerides 76 <150 mg/dL   HDL 50 >40 mg/dL   Total CHOL/HDL Ratio 3.4 RATIO   VLDL 15 0 - 40 mg/dL   LDL Cholesterol 107 (H) 0 - 99 mg/dL    Comment:        Total Cholesterol/HDL:CHD  Risk Coronary Heart Disease Risk Table                     Men   Women  1/2 Average Risk   3.4   3.3  Average Risk       5.0  4.4  2 X Average Risk   9.6   7.1  3 X Average Risk  23.4   11.0        Use the calculated Patient Ratio above and the CHD Risk Table to determine the patient's CHD Risk.        ATP III CLASSIFICATION (LDL):  <100     mg/dL   Optimal  100-129  mg/dL   Near or Above                    Optimal  130-159  mg/dL   Borderline  160-189  mg/dL   High  >190     mg/dL   Very High Performed at [facility] [address] [place], [place] [address]     Blood Alcohol level:  Lab Results  Component Value Date   ETH 142 (H) 05/26/2018   ETH <10 01/60/1093    Metabolic Disorder Labs: No results found for: HGBA1C, MPG No results found for: PROLACTIN Lab Results  Component Value Date   CHOL 172 05/30/2018   TRIG 76 05/30/2018   HDL 50 05/30/2018   CHOLHDL 3.4 05/30/2018   VLDL 15 05/30/2018   LDLCALC 107 (H) 05/30/2018    Physical Findings: AIMS: Facial and Oral Movements Muscles of Facial Expression: None, normal Lips and Perioral Area: None, normal Jaw: None, normal Tongue: None, normal,Extremity Movements Upper (arms, wrists, hands, fingers): None, normal Lower (legs, knees, ankles, toes): None, normal, Trunk Movements Neck, shoulders, hips: None, normal, Overall Severity Severity of abnormal movements (highest score from questions above): None, normal Incapacitation due to abnormal movements: None, normal Patient's awareness of abnormal movements (rate only patient's report): No Awareness, Dental Status Current problems with teeth and/or dentures?: No Does patient usually wear dentures?: No  CIWA:  CIWA-Ar Total: 0 COWS:     Musculoskeletal: Strength & Muscle Tone: within normal limits Gait & Station: normal Patient leans: N/A  Psychiatric Specialty Exam: Physical Exam  Vitals reviewed. Constitutional: He appears  well-developed.  Neurological: He is alert.  Psychiatric: He has a normal mood and affect.    Review of Systems  Psychiatric/Behavioral: Negative for depression and suicidal ideas. The patient is not nervous/anxious.   All other systems reviewed and are negative.   Blood pressure 137/81, pulse 89, temperature 98.1 F (36.7 C), temperature source Oral, resp. rate 16, height 5\' 9"  (1.753 m), weight 69.4 kg (153 lb).Body mass index is 22.59 kg/m.  General Appearance: Casual and Guarded  Eye Contact:  Fair  Speech:  Clear and Coherent slight blocked   Volume:  Normal  Mood:  Anxious, Hopeless and Irritable  Affect:  Depressed, Flat and Labile  Thought Process:  Disorganized  Orientation:  Full (Time, Place, and Person)  Thought Content:  Paranoid Ideation and Rumination  Suicidal Thoughts:  No  Homicidal Thoughts:  No  Memory:  Immediate;   Fair Recent;   Fair Remote;   Fair  Judgement:  Poor  Insight:  Lacking and Shallow  Psychomotor Activity:  Normal  Concentration:  Concentration: Fair  Recall:  [employer] of Knowledge:  Fair  Language:  Fair  Akathisia:  No  Handed:  Right  AIMS (if indicated):     Assets:  Communication Skills Desire for Improvement Resilience Social Support  ADL's:  Intact  Cognition:  WNL  Sleep:  Number of Hours: 6.25     Treatment Plan Summary: Daily contact with patient to assess and evaluate symptoms and  progress in treatment and Medication management   Continue with current treatment plan on 05/30/2018 as listed below except where nted  Continue with Invega 6 mg and  Invega sustenna 234mg  injection followed by invega 156mg  ingatcion due 06/01/2018 for mood stabilization. Continue with Trazodone 100 mg for insomnia  Will continue to monitor vitals ,medication compliance and treatment side effects while patient is here.  Reviewed labs: Lipid panel  ,BAL - , UDS - positive for [facility]  CSW will start working on disposition.  Patient to  participate in therapeutic milieu   Derrill Center, NP 05/30/2018, 10:57 AM

## 2018-05-31 LAB — PROLACTIN: Prolactin: 32.5 ng/mL — ABNORMAL HIGH (ref 4.0–15.2)

## 2018-05-31 MED ORDER — FLUTICASONE PROPIONATE 50 MCG/ACT NA SUSP
1.0000 | Freq: Every day | NASAL | Status: DC
  Administered 2018-05-31 – 2018-06-01 (×2): 1 via NASAL
  Filled 2018-05-31: qty 16

## 2018-05-31 NOTE — Progress Notes (Addendum)
[facility] MD Progress Note  05/31/2018 2:39 PM Tyler White  MRN:  [id number]  Subjective:  Tyler White seen resting in bedroom. cotinues to presents flat,  guarded and paranoid.  Reports feeling 'fine."  Continues to isolate to his room.  Patient appears to be minimizing symptoms and is minimal with responses.  Patient is prescribed in [place] reports taking and tolerating medications well.   Next Invega 153mg   injection due 06/01/2018.  Continues to deny suicidal homicidal ideations denies auditory or visual hallucinations.  support and encouragement and reassurance was provided.[name]  Histroy:Tyler White is a 22 y/o M with history of treatment for psychosis and polysubstance abuse who was [facility] where he presented with worsening paranoia, thought blocking, disorganized behaviors, and AH. He has recent history of discharge from [facility] in 10/2017 and encounter in ED in 01/2018 with SI and CAH to kill others but he was discharged to outpatient care at that time. Pt was medically cleared and then transferred to [facility].   Principal Problem: Schizophrenia, unspecified ([place]) Diagnosis:   Patient Active Problem List   Diagnosis Date Noted  . Cocaine abuse ([place]) [F14.10] 02/19/2018  . Cocaine abuse with cocaine-induced mood disorder ([place]) [F14.14] 02/19/2018  . Psychosis ([place]) [F29] 11/27/2017  . Cannabis abuse with psychotic disorder, with delusions ([place]) [F12.150] 11/26/2017  . Schizophrenia, unspecified ([place]) [F20.9] 11/26/2017   Total Time spent with patient: 20 minutes  Past Psychiatric History:  Past Medical History:  Past Medical History:  Diagnosis Date  . Hernia, inguinal, right   . Inguinal hernia    right  . Psychiatric illness    History reviewed. No pertinent surgical history. Family History:  Family History  Problem Relation Age of Onset  . Psychiatric Illness Mother   . Hypertension Sister    Family Psychiatric  History:  Social History:  Social History    Substance and Sexual Activity  Alcohol Use Yes     Social History   Substance and Sexual Activity  Drug Use Yes  . Types: Marijuana    Social History   Socioeconomic History  . Marital status: Single    Spouse name: Not on file  . Number of children: Not on file  . Years of education: Not on file  . Highest education level: Not on file  Occupational History  . Not on file  Social Needs  . Financial resource strain: Not on file  . Food insecurity:    Worry: Not on file    Inability: Not on file  . Transportation needs:    Medical: Not on file    Non-medical: Not on file  Tobacco Use  . Smoking status: Current Every Day Smoker    Packs/day: 0.50    Years: 5.00    Pack years: 2.50    Types: Cigarettes  . Smokeless tobacco: Never Used  Substance and Sexual Activity  . Alcohol use: Yes  . Drug use: Yes    Types: Marijuana  . Sexual activity: Yes    Birth control/protection: None  Lifestyle  . Physical activity:    Days per week: Not on file    Minutes per session: Not on file  . Stress: Not on file  Relationships  . Social connections:    Talks on phone: Not on file    Gets together: Not on file    Attends religious service: Not on file    Active member of club or organization: Not on file    Attends meetings of clubs  or organizations: Not on file    Relationship status: Not on file  Other Topics Concern  . Not on file  Social History Narrative   ** Merged History Encounter **       Additional Social History:                         Sleep: Fair  Appetite:  Fair  Current Medications: Current Facility-Administered Medications  Medication Dose Route Frequency Provider Last Rate Last Dose  . chlordiazePOXIDE (LIBRIUM) capsule 25 mg  25 mg Oral Q6H PRN Rankin, Shuvon B, NP   25 mg at 05/28/18 2151  . fluticasone (FLONASE) 50 MCG/ACT nasal spray 1 spray  1 spray Each Nare Daily Ricky Ala N, NP      . hydrOXYzine (ATARAX/VISTARIL) tablet 25  mg  25 mg Oral Q6H PRN Rankin, Shuvon B, NP   25 mg at 05/30/18 2104  . loperamide (IMODIUM) capsule 2-4 mg  2-4 mg Oral PRN Rankin, Shuvon B, NP      . OLANZapine zydis (ZYPREXA) disintegrating tablet 10 mg  10 mg Oral Q8H PRN Pennelope Bracken, MD       And  . LORazepam (ATIVAN) tablet 1 mg  1 mg Oral PRN Pennelope Bracken, MD       And  . ziprasidone (GEODON) injection 20 mg  20 mg Intramuscular PRN Pennelope Bracken, MD      . multivitamin with minerals tablet 1 tablet  1 tablet Oral Daily Rankin, Shuvon B, NP   1 tablet at 05/31/18 0834  . ondansetron (ZOFRAN-ODT) disintegrating tablet 4 mg  4 mg Oral Q6H PRN Rankin, Shuvon B, NP      . [START ON 06/01/2018] paliperidone (INVEGA SUSTENNA) injection 156 mg  156 mg Intramuscular Q28 days Maris Berger T, MD      . paliperidone (INVEGA) 24 hr tablet 6 mg  6 mg Oral Daily Pennelope Bracken, MD   6 mg at 05/31/18 0834  . thiamine (VITAMIN B-1) tablet 100 mg  100 mg Oral Daily Rankin, Shuvon B, NP   100 mg at 05/31/18 0834  . traZODone (DESYREL) tablet 50 mg  50 mg Oral QHS PRN Pennelope Bracken, MD   50 mg at 05/30/18 2104    Lab Results:  Results for orders placed or performed during the hospital encounter of 05/28/18 (from the past 48 hour(s))  TSH     Status: None   Collection Time: 05/30/18  6:27 AM  Result Value Ref Range   TSH 1.698 0.350 - 4.500 uIU/mL    Comment: Performed by a 3rd Generation assay with a functional sensitivity of <=0.01 uIU/mL. Performed at [facility], [place] [address] [place], [place] [address]   Prolactin     Status: Abnormal   Collection Time: 05/30/18  6:27 AM  Result Value Ref Range   Prolactin 32.5 (H) 4.0 - 15.2 ng/mL    Comment: (NOTE) Performed At: [facility] [place], [place] [id number] Rush Farmer MD FU:[id number] Performed at [facility], [place] [address], [place], [place] [address]    Lipid panel     Status: Abnormal   Collection Time: 05/30/18  6:27 AM  Result Value Ref Range   Cholesterol 172 0 - 200 mg/dL   Triglycerides 76 <150 mg/dL   HDL 50 >40 mg/dL   Total CHOL/HDL Ratio 3.4 RATIO   VLDL 15 0 - 40 mg/dL  LDL Cholesterol 107 (H) 0 - 99 mg/dL    Comment:        Total Cholesterol/HDL:CHD Risk Coronary Heart Disease Risk Table                     Men   Women  1/2 Average Risk   3.4   3.3  Average Risk       5.0   4.4  2 X Average Risk   9.6   7.1  3 X Average Risk  23.4   11.0        Use the calculated Patient Ratio above and the CHD Risk Table to determine the patient's CHD Risk.        ATP III CLASSIFICATION (LDL):  <100     mg/dL   Optimal  100-129  mg/dL   Near or Above                    Optimal  130-159  mg/dL   Borderline  160-189  mg/dL   High  >190     mg/dL   Very High Performed at [facility] [address] [place], [place] [address]   Hemoglobin A1c     Status: Abnormal   Collection Time: 05/30/18  6:27 AM  Result Value Ref Range   Hgb A1c MFr Bld 4.6 (L) 4.8 - 5.6 %    Comment: (NOTE) Pre diabetes:          5.7%-6.4% Diabetes:              >6.4% Glycemic control for   <7.0% adults with diabetes    Mean Plasma Glucose 85.32 mg/dL    Comment: Performed at [facility] [address], [place], [place] [address]    Blood Alcohol level:  Lab Results  Component Value Date   ETH 142 (H) 05/26/2018   ETH <10 82/95/6213    Metabolic Disorder Labs: Lab Results  Component Value Date   HGBA1C 4.6 (L) 05/30/2018   MPG 85.32 05/30/2018   Lab Results  Component Value Date   PROLACTIN 32.5 (H) 05/30/2018   Lab Results  Component Value Date   CHOL 172 05/30/2018   TRIG 76 05/30/2018   HDL 50 05/30/2018   CHOLHDL 3.4 05/30/2018   VLDL 15 05/30/2018   LDLCALC 107 (H) 05/30/2018    Physical Findings: AIMS: Facial and Oral Movements Muscles of Facial Expression: None, normal Lips and Perioral  Area: None, normal Jaw: None, normal Tongue: None, normal,Extremity Movements Upper (arms, wrists, hands, fingers): None, normal Lower (legs, knees, ankles, toes): None, normal, Trunk Movements Neck, shoulders, hips: None, normal, Overall Severity Severity of abnormal movements (highest score from questions above): None, normal Incapacitation due to abnormal movements: None, normal Patient's awareness of abnormal movements (rate only patient's report): No Awareness, Dental Status Current problems with teeth and/or dentures?: No Does patient usually wear dentures?: No  CIWA:  CIWA-Ar Total: 0 COWS:  COWS Total Score: 0  Musculoskeletal: Strength & Muscle Tone: within normal limits Gait & Station: normal Patient leans: N/A  Psychiatric Specialty Exam: Physical Exam  Vitals reviewed. Constitutional: He appears well-developed.  Neurological: He is alert.  Psychiatric: He has a normal mood and affect.    Review of Systems  Psychiatric/Behavioral: Negative for depression and suicidal ideas. The patient is not nervous/anxious.   All other systems reviewed and are negative.   Blood pressure 122/76, pulse 74, temperature 98.1 F (36.7 C),  temperature source Oral, resp. rate 16, height 5\' 9"  (1.753 m), weight 69.4 kg (153 lb).Body mass index is 22.59 kg/m.  General Appearance: Casual and Guarded and flat  Eye Contact:  Fair  Speech:  Clear and Coherent slight blocked   Volume:  Normal  Mood:  Anxious, Hopeless and Irritable  Affect:  Blunt, Depressed and Flat  Thought Process:  Disorganized  Orientation:  Full (Time, Place, and Person)  Thought Content:  Paranoid Ideation and Rumination  Suicidal Thoughts:  No  Homicidal Thoughts:  No  Memory:  Immediate;   Fair Recent;   Fair Remote;   Fair  Judgement:  Poor  Insight:  Lacking and Shallow  Psychomotor Activity:  Normal  Concentration:  Concentration: Fair  Recall:  [employer] of Knowledge:  Fair  Language:  Fair   Akathisia:  No  Handed:  Right  AIMS (if indicated):     Assets:  Communication Skills Desire for Improvement Resilience Social Support  ADL's:  Intact  Cognition:  WNL  Sleep:  Number of Hours: 4.25     Treatment Plan Summary: Daily contact with patient to assess and evaluate symptoms and progress in treatment and Medication management   Continue with current treatment plan on 05/31/2018 as listed below except where nted  Continue with Invega 6 mg and  Invega sustenna 234 mg injection followed by invega 156mg  ingatcion due 06/01/2018 for mood stabilization. Continue with Trazodone 100 mg for insomnia  Will continue to monitor vitals ,medication compliance and treatment side effects while patient is here.  Reviewed labs: Lipid panel  ,BAL - , UDS - positive for [facility]  CSW will start working on disposition.  Patient to participate in therapeutic milieu   Derrill Center, NP 05/31/2018, 2:39 PM    ..Marland KitchenAgree with NP Progress Note

## 2018-05-31 NOTE — Progress Notes (Signed)
Patient has been isolative to his room. Minimal to no interaction with peers and staff. He was moved to another room today and he did report being glad to move so that he can get better rest. He reports hopeful to discharge on tomorrow and plans to look for a job, something warehouse related. Support given and safety maintained on unit with 15 min checks.

## 2018-05-31 NOTE — BHH Group Notes (Signed)
[facility] LCSW Group Therapy Note  Date/Time:  05/31/2018  11:00AM-12:00PM  Type of Therapy and Topic:  Group Therapy:  Music and Mood  Participation Level:  Active   Description of Group: In this process group, members listened to a variety of genres of music and identified that different types of music evoke different responses.  Patients were encouraged to identify music that was soothing for them and music that was energizing for them.  Patients discussed how this knowledge can help with wellness and recovery in various ways including managing depression and anxiety as well as encouraging healthy sleep habits.    Therapeutic Goals: 1. Patients will explore the impact of different varieties of music on mood 2. Patients will verbalize the thoughts they have when listening to different types of music 3. Patients will identify music that is soothing to them as well as music that is energizing to them 4. Patients will discuss how to use this knowledge to assist in maintaining wellness and recovery 5. Patients will explore the use of music as a coping skill  Summary of Patient Progress:  At the beginning of group, patient expressed himself in a mumble several times, and could not be understood.  He rocked back and forth for awhile in his seat.  He seemed to become more aware of his surroundings, started responding to questions and at the end of group said he liked the music but did not say if he felt better.  Therapeutic Modalities: Solution Focused Brief Therapy Activity   Selmer Dominion, LCSW

## 2018-05-31 NOTE — Plan of Care (Signed)
Patient has minimal participation in plan of care. Exhibiting fatigue and poor sleep habits. Contracts for safety.

## 2018-05-31 NOTE — BHH Group Notes (Signed)
[place] [facility] Group Notes:  (Nursing/MHT/Case Management/Adjunct)  Date:  05/31/2018  Time:  4:39 PM  Type of Therapy:  Psychoeducational Skills  Participation Level:  Active  Participation Quality:  Appropriate and Sharing  Affect:  Flat  Cognitive:  Alert  Insight:  Good  Engagement in Group:  Developing/Improving  Modes of Intervention:  Discussion and Education  Summary of Progress/Problems: Group was on good sleep hygiene and habits. This included, setting a routine, cutting out caffeine, alcohol and nicotine, creating a comfortable environment, and only going to bed when tired.  Lesli Albee 05/31/2018, 4:39 PM

## 2018-05-31 NOTE — Progress Notes (Signed)
D: Patient presents lethargic, guarded, and irritable. He did eventually get out of bed to take his medications this morning with encouragement. Patient had minimal replies to assessment questions. He did report having poor sleep, due to his roommate being disruptive. Patient denies SI/HI/AVH.  A: Patient checked q15 min, and checks reviewed. Reviewed medication changes with patient and educated on side effects. Educated patient on importance of attending group therapy sessions and educated on several coping skills. Encouarged participation in milieu through recreation therapy and attending meals with peers. Fluids offered and given. R: Patient receptive to education on medications, and is medication compliant. Patient isolates to room, and is not attending group therapy sessions. Patient contracts for safety on the unit.

## 2018-06-01 DIAGNOSIS — R451 Restlessness and agitation: Secondary | ICD-10-CM

## 2018-06-01 DIAGNOSIS — G4700 Insomnia, unspecified: Secondary | ICD-10-CM

## 2018-06-01 MED ORDER — TRAZODONE HCL 100 MG PO TABS
100.0000 mg | ORAL_TABLET | Freq: Every evening | ORAL | Status: DC | PRN
  Administered 2018-06-01: 100 mg via ORAL
  Filled 2018-06-01: qty 1

## 2018-06-01 MED ORDER — HYDROXYZINE HCL 50 MG PO TABS
50.0000 mg | ORAL_TABLET | Freq: Once | ORAL | Status: AC
  Administered 2018-06-01: 50 mg via ORAL
  Filled 2018-06-01 (×2): qty 1

## 2018-06-01 NOTE — Progress Notes (Signed)
Recreation Therapy Notes  Date: 6.3.19 Time: 1000 Location: 500 Hall Dayroom  Group Topic: Coping Skills  Goal Area(s) Addresses:  Patient will be able to identify positive coping skills. Patient will be able to identify benefits of using coping skills post d/c.  Behavioral Response: Engaged  Intervention: Worksheet, pencils  Activity: Mind map.  LRT and patients filled in the first 8 boxes of the mind map together (guilt, disappointment, sadness, anger, physical harm, depression, anxiety and financial problems).  Patients were to then come up with 3 coping skills for each problem individually before reconvening as a group.  LRT would then fill in the coping skills on the board.   Education: Radiographer, therapeutic, Dentist.   Education Outcome: Acknowledges understanding/In group clarification offered/Needs additional education.   Clinical Observations/Feedback: Pt engaged during activity.  Pt identified some of his coping skills as get some rest, borrow from family, use your faith, spend time with family, pray and never give up.     Victorino Sparrow, LRT/CTRS         Victorino Sparrow A 06/01/2018 11:51 AM

## 2018-06-01 NOTE — Progress Notes (Signed)
[facility] MD Progress Note  06/01/2018 3:14 PM Tyler White  MRN:  [id number] Subjective:    Tyler White is a 22 y/o M with history of treatment for psychosis and polysubstance abuse who was admitted voluntarily from [place] where he presented with worsening paranoia, thought blocking, disorganized behaviors, and AH. He has recent history of discharge from [facility] in 10/2017 and encounter in ED in 01/2018 with SI and CAH to kill others but he was discharged to outpatient care at that time. Pt was medically cleared and then transferred to [facility]. He was started on trial of Invega and transitioned to long-acting injectable form of [place]. He reported improvement of his presenting symptoms during his hospitalization.  Today upon evaluation, pt shares, "Everything is going okay. I feel like I can keep going okay." Pt is mildly vague and minimizing, but he reports that overall he is doing well and he is tolerating his current treatment regimen well. He is sleeping well as per his report, but RN staff recorded 4 hours of sleep. His appetite is good. He denies SI/HI. He continues to report mild AH, stating, "It's like every day, but it doesn't bother me, I don't have to listen to it." He denies VH. He is in agreement to receive booster injection of [place] today. He is in agreement with the above plan and he had no further questions, comments, or concerns.  Principal Problem: Schizophrenia, unspecified ([place]) Diagnosis:   Patient Active Problem List   Diagnosis Date Noted  . Cocaine abuse ([place]) [F14.10] 02/19/2018  . Cocaine abuse with cocaine-induced mood disorder ([place]) [F14.14] 02/19/2018  . Psychosis ([place]) [F29] 11/27/2017  . Cannabis abuse with psychotic disorder, with delusions ([place]) [F12.150] 11/26/2017  . Schizophrenia, unspecified ([place]) [F20.9] 11/26/2017   Total Time spent with patient: 30 minutes  Past Psychiatric History: see H&P  Past Medical History:  Past Medical History:   Diagnosis Date  . Hernia, inguinal, right   . Inguinal hernia    right  . Psychiatric illness    History reviewed. No pertinent surgical history. Family History:  Family History  Problem Relation Age of Onset  . Psychiatric Illness Mother   . Hypertension Sister    Family Psychiatric  History: see H&P Social History:  Social History   Substance and Sexual Activity  Alcohol Use Yes     Social History   Substance and Sexual Activity  Drug Use Yes  . Types: Marijuana    Social History   Socioeconomic History  . Marital status: Single    Spouse name: Not on file  . Number of children: Not on file  . Years of education: Not on file  . Highest education level: Not on file  Occupational History  . Not on file  Social Needs  . Financial resource strain: Not on file  . Food insecurity:    Worry: Not on file    Inability: Not on file  . Transportation needs:    Medical: Not on file    Non-medical: Not on file  Tobacco Use  . Smoking status: Current Every Day Smoker    Packs/day: 0.50    Years: 5.00    Pack years: 2.50    Types: Cigarettes  . Smokeless tobacco: Never Used  Substance and Sexual Activity  . Alcohol use: Yes  . Drug use: Yes    Types: Marijuana  . Sexual activity: Yes    Birth control/protection: None  Lifestyle  . Physical activity:    Days  per week: Not on file    Minutes per session: Not on file  . Stress: Not on file  Relationships  . Social connections:    Talks on phone: Not on file    Gets together: Not on file    Attends religious service: Not on file    Active member of club or organization: Not on file    Attends meetings of clubs or organizations: Not on file    Relationship status: Not on file  Other Topics Concern  . Not on file  Social History Narrative   ** Merged History Encounter **       Additional Social History:                         Sleep: Fair  Appetite:  Good  Current Medications: Current  Facility-Administered Medications  Medication Dose Route Frequency Provider Last Rate Last Dose  . fluticasone (FLONASE) 50 MCG/ACT nasal spray 1 spray  1 spray Each Nare Daily Derrill Center, NP   1 spray at 06/01/18 0831  . multivitamin with minerals tablet 1 tablet  1 tablet Oral Daily Rankin, Shuvon B, NP   1 tablet at 06/01/18 0832  . OLANZapine zydis (ZYPREXA) disintegrating tablet 10 mg  10 mg Oral Q8H PRN Pennelope Bracken, MD       And  . ziprasidone (GEODON) injection 20 mg  20 mg Intramuscular PRN Pennelope Bracken, MD      . paliperidone (INVEGA SUSTENNA) injection 156 mg  156 mg Intramuscular Q28 days Pennelope Bracken, MD   156 mg at 06/01/18 1100  . paliperidone (INVEGA) 24 hr tablet 6 mg  6 mg Oral Daily Pennelope Bracken, MD   6 mg at 06/01/18 4132  . thiamine (VITAMIN B-1) tablet 100 mg  100 mg Oral Daily Rankin, Shuvon B, NP   100 mg at 06/01/18 0832  . traZODone (DESYREL) tablet 50 mg  50 mg Oral QHS PRN Pennelope Bracken, MD   50 mg at 05/31/18 2100    Lab Results: No results found for this or any previous visit (from the past 48 hour(s)).  Blood Alcohol level:  Lab Results  Component Value Date   ETH 142 (H) 05/26/2018   ETH <10 44/12/270    Metabolic Disorder Labs: Lab Results  Component Value Date   HGBA1C 4.6 (L) 05/30/2018   MPG 85.32 05/30/2018   Lab Results  Component Value Date   PROLACTIN 32.5 (H) 05/30/2018   Lab Results  Component Value Date   CHOL 172 05/30/2018   TRIG 76 05/30/2018   HDL 50 05/30/2018   CHOLHDL 3.4 05/30/2018   VLDL 15 05/30/2018   LDLCALC 107 (H) 05/30/2018    Physical Findings: AIMS: Facial and Oral Movements Muscles of Facial Expression: None, normal Lips and Perioral Area: None, normal Jaw: None, normal Tongue: None, normal,Extremity Movements Upper (arms, wrists, hands, fingers): None, normal Lower (legs, knees, ankles, toes): None, normal, Trunk Movements Neck, shoulders,  hips: None, normal, Overall Severity Severity of abnormal movements (highest score from questions above): None, normal Incapacitation due to abnormal movements: None, normal Patient's awareness of abnormal movements (rate only patient's report): No Awareness, Dental Status Current problems with teeth and/or dentures?: No Does patient usually wear dentures?: No  CIWA:  CIWA-Ar Total: 1 COWS:  COWS Total Score: 1  Musculoskeletal: Strength & Muscle Tone: within normal limits Gait & Station: normal Patient leans: N/A  Psychiatric Specialty  Exam: Physical Exam  Nursing note and vitals reviewed.   Review of Systems  Constitutional: Negative for chills and fever.  Respiratory: Negative for cough and shortness of breath.   Cardiovascular: Negative for chest pain.  Gastrointestinal: Negative for abdominal pain, heartburn, nausea and vomiting.  Psychiatric/Behavioral: Positive for hallucinations. Negative for depression and suicidal ideas. The patient is not nervous/anxious and does not have insomnia.     Blood pressure 120/77, pulse 72, temperature 97.7 F (36.5 C), temperature source Oral, resp. rate 18, height 5\' 9"  (1.753 m), weight 69.4 kg (153 lb).Body mass index is 22.59 kg/m.  General Appearance: Casual and Fairly Groomed  Eye Contact:  Good  Speech:  Clear and Coherent and Normal Rate  Volume:  Normal  Mood:  Euthymic  Affect:  Congruent and Constricted  Thought Process:  Coherent and Goal Directed  Orientation:  Full (Time, Place, and Person)  Thought Content:  Logical  Suicidal Thoughts:  No  Homicidal Thoughts:  No  Memory:  Immediate;   Fair Recent;   Fair Remote;   Fair  Judgement:  Fair  Insight:  Lacking  Psychomotor Activity:  Normal  Concentration:  Concentration: Fair  Recall:  [employer] of Knowledge:  Fair  Language:  Fair  Akathisia:  No  Handed:    AIMS (if indicated):     Assets:  Communication Skills Resilience Social Support Talents/Skills   ADL's:  Intact  Cognition:  WNL  Sleep:  Number of Hours: 4   Treatment Plan Summary: Daily contact with patient to assess and evaluate symptoms and progress in treatment and Medication management   -Continue inpatient hospitalization  -Schizophrenia   -Continue Invega 6mg  po qDay  -Continue [place]. Lorayne Bender Sustenna 234mg  IM administered on 05/29/18 and plan to administer Kirt Boys 156mg  IM q28 days starting on today 06/01/18  -Insomnia   -Continue trazodone 100mg  po qhs  -Agitation    -Continue zydis 10mg  po q8h prn agitation     -Continue ativan 1mg  po once prn agitation   -Continue geodon 20mg  IM once prn agitation and refusing oral meds   -Encourage participation in groups and therapeutic milieu  -disposition planning will be ongoing  Pennelope Bracken, MD 06/01/2018, 3:14 PM

## 2018-06-01 NOTE — Plan of Care (Signed)
Nurse discussed depression, anxiety, coping skills with patient.  

## 2018-06-01 NOTE — Progress Notes (Signed)
D:  Patient's self inventory sheet, patient sleeps good, no sleep medication given.  Fair appetite, normal energy level, good concentration.  Denied depression, hopeless and anxiety.  Denied withdrawals.  Denied SI.  Denied physical problems.  Denied physical pain.  Worst pain in past 24 hours #1, pain medication helpful.  Goal is coping skills, plans to attend groups.  Does have discharge plans. A:  Medications administered per MD orders.  Emotional support and encouragement given patient. R:  Denied SI and HI, contracts for safety.  Denied A/V hallucinations.  Safety maintained with 15 minute checks.

## 2018-06-01 NOTE — BHH Group Notes (Signed)
LCSW Group Therapy Note   06/01/2018 1:15pm   Type of Therapy and Topic:  Group Therapy:  Overcoming Obstacles   Participation Level:  Minimal   Description of Group:    In this group patients will be encouraged to explore what they see as obstacles to their own wellness and recovery. They will be guided to discuss their thoughts, feelings, and behaviors related to these obstacles. The group will process together ways to cope with barriers, with attention given to specific choices patients can make. Each patient will be challenged to identify changes they are motivated to make in order to overcome their obstacles. This group will be process-oriented, with patients participating in exploration of their own experiences as well as giving and receiving support and challenge from other group members.   Therapeutic Goals: 1. Patient will identify personal and current obstacles as they relate to admission. 2. Patient will identify barriers that currently interfere with their wellness or overcoming obstacles.  3. Patient will identify feelings, thought process and behaviors related to these barriers. 4. Patient will identify two changes they are willing to make to overcome these obstacles:      Summary of Patient Progress   Stayed the entire time, slept for part of it.  Identified his biggest obstacle as "negative people" who are "bad influences" and try to "bring you in."  Unwilling to be more specific.  Others gave feedback on how they handle these kinds of folks.   Therapeutic Modalities:   Cognitive Behavioral Therapy Solution Focused Therapy Motivational Interviewing Relapse Prevention Therapy  [name], LCSW 06/01/2018 3:19 PM

## 2018-06-01 NOTE — Plan of Care (Signed)
D: Pt denies SI/HI/AVH , pt stated he had an appointment with his probation officer tomorrow. Pt very paranoid and avoidant this evening A: Pt was offered support and encouragement. Pt was given scheduled medications. Pt was encourage to attend groups. Q 15 minute checks were done for safety.   R:Pt attends groups and interacts well with peers and staff. Pt is taking medication. Pt has no complaints.Pt receptive to treatment and safety maintained on unit.   Problem: Coping: Goal: Ability to verbalize frustrations and anger appropriately will improve Outcome: Progressing   Problem: Safety: Goal: Periods of time without injury will increase Outcome: Progressing

## 2018-06-02 MED ORDER — SERTRALINE HCL 50 MG PO TABS: 50.0000 mg | ORAL_TABLET | Freq: Every day | ORAL | 0 refills | Status: DC

## 2018-06-02 MED ORDER — THIAMINE HCL 100 MG PO TABS: 100.0000 mg | ORAL_TABLET | Freq: Every day | ORAL | 0 refills | Status: DC

## 2018-06-02 MED ORDER — FLUTICASONE PROPIONATE 50 MCG/ACT NA SUSP: 1.0000 | Freq: Every day | NASAL | 0 refills | Status: DC

## 2018-06-02 MED ORDER — NICOTINE 21 MG/24HR TD PT24: 21.0000 mg | MEDICATED_PATCH | Freq: Every day | TRANSDERMAL | 0 refills | Status: DC

## 2018-06-02 MED ORDER — PALIPERIDONE PALMITATE ER 156 MG/ML IM SUSY: 156.0000 mg | PREFILLED_SYRINGE | INTRAMUSCULAR | 0 refills | Status: DC

## 2018-06-02 MED ORDER — PALIPERIDONE ER 6 MG PO TB24: 6.0000 mg | ORAL_TABLET | Freq: Every day | ORAL | 0 refills | Status: DC

## 2018-06-02 MED ORDER — GABAPENTIN 100 MG PO CAPS: 200.0000 mg | ORAL_CAPSULE | Freq: Two times a day (BID) | ORAL | 0 refills | Status: DC

## 2018-06-02 MED ORDER — TRAZODONE HCL 100 MG PO TABS: 100.0000 mg | ORAL_TABLET | Freq: Every evening | ORAL | 0 refills | Status: DC | PRN

## 2018-06-02 NOTE — Progress Notes (Signed)
  [facility] Adult Case Management Discharge Plan :  Will you be returning to the same living situation after discharge:  Yes,  home At discharge, do you have transportation home?: Yes,  mother Do you have the ability to pay for your medications: Yes,  MCD  Release of information consent forms completed and in the chart;  Patient's signature needed at discharge.  Patient to Follow up at: Follow-up Information    [facility] Follow up on 06/03/2018.   Specialty:  Behavioral Health Why:  Wednesday at [place] with Earlie Server for your hospital follow upappointment. You have been referred to transitional care team.  Call Valerie at [phone] for help with transportation and other services Contact information: [place] [phone]           Next level of care provider has access to [place] and Suicide Prevention discussed: Yes,  yes  Have you used any form of tobacco in the last 30 days? (Cigarettes, Smokeless Tobacco, Cigars, and/or Pipes): Yes  Has patient been referred to the Quitline?: Patient refused referral  Patient has been referred for addiction treatment: Pt. refused referral  [name], LCSW 06/02/2018, 9:37 AM

## 2018-06-02 NOTE — Tx Team (Signed)
Interdisciplinary Treatment and Diagnostic Plan Update  06/02/2018 Time of Session: 9:33 AM  Tyler White MRN: [id number]  Principal Diagnosis: Schizophrenia, unspecified ([place])  Secondary Diagnoses: Principal Problem:   Schizophrenia, unspecified ([place])   Current Medications:  Current Facility-Administered Medications  Medication Dose Route Frequency Provider Last Rate Last Dose  . fluticasone (FLONASE) 50 MCG/ACT nasal spray 1 spray  1 spray Each Nare Daily Derrill Center, NP   1 spray at 06/01/18 0831  . multivitamin with minerals tablet 1 tablet  1 tablet Oral Daily Rankin, Shuvon B, NP   1 tablet at 06/02/18 0828  . OLANZapine zydis (ZYPREXA) disintegrating tablet 10 mg  10 mg Oral Q8H PRN Pennelope Bracken, MD       And  . ziprasidone (GEODON) injection 20 mg  20 mg Intramuscular PRN Pennelope Bracken, MD      . paliperidone (INVEGA SUSTENNA) injection 156 mg  156 mg Intramuscular Q28 days Pennelope Bracken, MD   156 mg at 06/01/18 1100  . paliperidone (INVEGA) 24 hr tablet 6 mg  6 mg Oral Daily Pennelope Bracken, MD   6 mg at 06/02/18 6599  . thiamine (VITAMIN B-1) tablet 100 mg  100 mg Oral Daily Rankin, Shuvon B, NP   100 mg at 06/02/18 0829  . traZODone (DESYREL) tablet 100 mg  100 mg Oral QHS PRN Lindon Romp A, NP   100 mg at 06/01/18 2112    PTA Medications: Medications Prior to Admission  Medication Sig Dispense Refill Last Dose  . gabapentin (NEURONTIN) 100 MG capsule Take 2 capsules (200 mg total) by mouth 2 (two) times daily. For agitation (Patient not taking: Reported on 05/27/2018) 120 capsule 0 Not Taking at Unknown time  . nicotine (NICODERM CQ - DOSED IN MG/24 HOURS) 21 mg/24hr patch Place 1 patch (21 mg total) onto the skin daily. (May purchase from over the counter at the pharmacy): For smoking cessation (Patient not taking: Reported on 05/27/2018) 28 patch 0 Not Taking at Unknown time  . risperiDONE (RISPERDAL) 2 MG tablet Take 1  tablet (2 mg) by mouth in the morning & 1 tablet (2 mg) at bedtime: For mood control (Patient not taking: Reported on 05/27/2018) 60 tablet 0 Not Taking at Unknown time  . sertraline (ZOLOFT) 50 MG tablet Take 1 tablet (50 mg total) by mouth daily. For depression (Patient not taking: Reported on 05/27/2018) 30 tablet 0 Not Taking at Unknown time  . traZODone (DESYREL) 50 MG tablet Take 1 tablet (50 mg total) by mouth at bedtime as needed for sleep. (Patient not taking: Reported on 05/27/2018) 30 tablet 0 Not Taking at Unknown time    Patient Stressors: Medication change or noncompliance Substance abuse  Patient Strengths: Ability for insight Average or above average intelligence Capable of independent living General fund of knowledge  Treatment Modalities: Medication Management, Group therapy, Case management,  1 to 1 session with clinician, Psychoeducation, Recreational therapy.   Physician Treatment Plan for Primary Diagnosis: Schizophrenia, unspecified ([place]) Long Term Goal(s): Improvement in symptoms so as ready for discharge  Short Term Goals: Ability to demonstrate self-control will improve Ability to identify and develop effective coping behaviors will improve  Medication Management: Evaluate patient's response, side effects, and tolerance of medication regimen.  Therapeutic Interventions: 1 to 1 sessions, Unit Group sessions and Medication administration.  Evaluation of Outcomes: Adequate for Discharge  Physician Treatment Plan for Secondary Diagnosis: Principal Problem:   Schizophrenia, unspecified ([place])   Long Term Goal(s): Improvement  in symptoms so as ready for discharge  Short Term Goals: Ability to demonstrate self-control will improve Ability to identify and develop effective coping behaviors will improve  Medication Management: Evaluate patient's response, side effects, and tolerance of medication regimen.  Therapeutic Interventions: 1 to 1 sessions, Unit Group  sessions and Medication administration.  Evaluation of Outcomes: Adequate for Discharge   RN Treatment Plan for Primary Diagnosis: Schizophrenia, unspecified ([place]) Long Term Goal(s): Knowledge of disease and therapeutic regimen to maintain health will improve  Short Term Goals: Ability to identify and develop effective coping behaviors will improve and Compliance with prescribed medications will improve  Medication Management: RN will administer medications as ordered by provider, will assess and evaluate patient's response and provide education to patient for prescribed medication. RN will report any adverse and/or side effects to prescribing provider.  Therapeutic Interventions: 1 on 1 counseling sessions, Psychoeducation, Medication administration, Evaluate responses to treatment, Monitor vital signs and CBGs as ordered, Perform/monitor CIWA, COWS, AIMS and Fall Risk screenings as ordered, Perform wound care treatments as ordered.  Evaluation of Outcomes: Adequate for Discharge   LCSW Treatment Plan for Primary Diagnosis: Schizophrenia, unspecified ([place]) Long Term Goal(s): Safe transition to appropriate next level of care at discharge, Engage patient in therapeutic group addressing interpersonal concerns.  Short Term Goals: Engage patient in aftercare planning with referrals and resources  Therapeutic Interventions: Assess for all discharge needs, 1 to 1 time with Social worker, Explore available resources and support systems, Assess for adequacy in community support network, Educate family and significant other(s) on suicide prevention, Complete Psychosocial Assessment, Interpersonal group therapy.  Evaluation of Outcomes: Met  Return home, follow up [facility]   Progress in Treatment: Attending groups: Yes Participating in groups: Yes Taking medication as prescribed: Yes Toleration medication: Yes, no side effects reported at this time Family/Significant other contact made:  Yes Patient understands diagnosis: No minimal insight Discussing patient identified problems/goals with staff: Yes Medical problems stabilized or resolved: Yes Denies suicidal/homicidal ideation: Yes Issues/concerns per patient self-inventory: None Other: N/A  New problem(s) identified: None identified at this time.   New Short Term/Long Term Goal(s): "I want to feel better."   Discharge Plan or Barriers:   Reason for Continuation of Hospitalization:   Estimated Length of Stay: D/C today  Attendees: Patient: Tyler White 06/02/2018  9:33 AM  Physician: Maris Berger, MD 06/02/2018  9:33 AM  Nursing: Sena Hitch, RN 06/02/2018  9:33 AM  RN Care Manager: Lars Pinks, RN 06/02/2018  9:33 AM  Social Worker: Ripley Fraise 06/02/2018  9:33 AM  Recreational Therapist: Winfield Cunas 06/02/2018  9:33 AM  Other: Norberto Sorenson 06/02/2018  9:33 AM  Other:  06/02/2018  9:33 AM    Scribe for Treatment Team:  Roque Lias LCSW 06/02/2018 9:33 AM

## 2018-06-02 NOTE — Progress Notes (Signed)
Recreation Therapy Notes  INPATIENT RECREATION TR PLAN  Patient Details Name: Tyler White MRN: [id number] DOB: [birth year] Today's Date: 06/02/2018  Rec Therapy Plan Is patient appropriate for Therapeutic Recreation?: Yes Treatment times per week: about 3 days Estimated Length of Stay: 5-7 days TR Treatment/Interventions: Group participation (Comment)  Discharge Criteria Pt will be discharged from therapy if:: Discharged Treatment plan/goals/alternatives discussed and agreed upon by:: Patient/family  Discharge Summary Short term goals set: See patient care plan Short term goals met: Complete Progress toward goals comments: Groups attended Which groups?: Wellness, Coping skills, Other (Comment)(Trust) Reason goals not met: None Therapeutic equipment acquired: N/A Reason patient discharged from therapy: Discharge from hospital Pt/family agrees with progress & goals achieved: Yes Date patient discharged from therapy: 06/02/18     Victorino Sparrow, LRT/CTRS   Ria Comment, Braian Tijerina A 06/02/2018, 12:00 PM

## 2018-06-02 NOTE — Progress Notes (Signed)
Recreation Therapy Notes  Date: 6.4.19 Time: 1000 Location: 500 Hall  Group Topic: Communication, Team Building, Problem Solving  Goal Area(s) Addresses:  Patient will effectively work with peer towards shared goal.  Patient will identify skill used to make activity successful.  Patient will identify how skills used during activity can be used to reach post d/c goals.   Behavioral Response: Engaged  Intervention: Trust Activity  Activity:  Patients were given rubber discs (one more than the number of people doing the activity).  Patients were to use the rubber discs to maneuver from one end of the hall to the other and back to the starting point.  Patients were to remain on their disc at all times, if anyone stepped off, the group would have to start over from the beginning.   Education: Education officer, community, Dentist.   Education Outcome: Acknowledges education/In group clarification offered/Needs additional education.   Clinical Observations/Feedback: Pt was active and engaged.  Pt worked well with his peers to complete activity.  Pt expressed that "sometimes it's hard to trust people".     Victorino Sparrow, LRT/CTRS     Ria Comment, Bonifacio Pruden A 06/02/2018 11:12 AM
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
## 2018-06-02 NOTE — Progress Notes (Signed)
D:  Patient's self inventory sheet, patient sleeps good, sleep medication helpful.  Good appetite, normal energy level, good concentration.  Denied depression, hopeless, anxiety.  Denied withdrawals.  Denied SI.  Denied physical problems.  Denied physical pain.  Goal is attend groups.  Plans to attend.  No discharge plans. A:  Medications administered per MD orders.  Emotional support and encouragement given patient. R:  Denied SI and HI, contracts for safety.  Denied A/V hallucinations.  Safety maintained with 15 minute checks.

## 2018-06-02 NOTE — Plan of Care (Signed)
Pt was engaged and interacted appropriately during recreation therapy sessions.   Victorino Sparrow, LRT/CTRS

## 2018-06-02 NOTE — Progress Notes (Signed)
Discharge Note:  Patient discharged with follow up.  Patient denied SI and HI.  Denied pain.  Suicide prevention information given and discussed with patient who stated he understood and had no questions.  Patient stated he received all his belongings, clothing, toiletries, misc items, prescriptions, etc.  Patient stated he appreciated all assistance received from [facility] staff.  All required discharge information given to patient at discharge.

## 2018-06-07 ENCOUNTER — Emergency Department (HOSPITAL_COMMUNITY)
Admission: EM | Admit: 2018-06-07 | Discharge: 2018-06-08 | Disposition: A | Discharge status: Home / Self Care | Payer: Medicaid Other | Attending: Emergency Medicine | Admitting: Emergency Medicine

## 2018-06-07 DIAGNOSIS — R51 Headache: Secondary | ICD-10-CM | POA: Diagnosis not present

## 2018-06-07 DIAGNOSIS — F1910 Other psychoactive substance abuse, uncomplicated: Secondary | ICD-10-CM | POA: Diagnosis not present

## 2018-06-07 DIAGNOSIS — R519 Headache, unspecified: Secondary | ICD-10-CM

## 2018-06-07 DIAGNOSIS — F17210 Nicotine dependence, cigarettes, uncomplicated: Secondary | ICD-10-CM | POA: Insufficient documentation

## 2018-06-07 DIAGNOSIS — Z79899 Other long term (current) drug therapy: Secondary | ICD-10-CM | POA: Diagnosis not present
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
## 2018-06-08 ENCOUNTER — Encounter (HOSPITAL_COMMUNITY): Payer: Self-pay | Admitting: Emergency Medicine

## 2018-06-08 LAB — RAPID URINE DRUG SCREEN, HOSP PERFORMED
Amphetamines: NOT DETECTED
BARBITURATES: NOT DETECTED
Benzodiazepines: NOT DETECTED
Cocaine: POSITIVE — AB
Opiates: NOT DETECTED
Tetrahydrocannabinol: POSITIVE — AB

## 2018-06-08 MED ORDER — IBUPROFEN 800 MG PO TABS
800.0000 mg | ORAL_TABLET | Freq: Once | ORAL | Status: AC
  Administered 2018-06-08: 800 mg via ORAL
  Filled 2018-06-08: qty 1

## 2018-06-08 NOTE — Discharge Instructions (Signed)
Take Tylenol or ibuprofen for persistent headache.  Take as instructed on the bottle; this can be purchased over-the-counter at your local pharmacy.  Continue follow-up with your primary care doctors.
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
## 2018-08-07 ENCOUNTER — Other Ambulatory Visit: Payer: Self-pay

## 2018-08-07 ENCOUNTER — Emergency Department (HOSPITAL_COMMUNITY)
Admission: EM | Admit: 2018-08-07 | Discharge: 2018-08-07 | Disposition: A | Discharge status: Home / Self Care | Payer: Medicaid Other | Attending: Emergency Medicine | Admitting: Emergency Medicine

## 2018-08-07 DIAGNOSIS — F17210 Nicotine dependence, cigarettes, uncomplicated: Secondary | ICD-10-CM | POA: Insufficient documentation

## 2018-08-07 DIAGNOSIS — R1110 Vomiting, unspecified: Secondary | ICD-10-CM | POA: Diagnosis present

## 2018-08-07 DIAGNOSIS — Z79899 Other long term (current) drug therapy: Secondary | ICD-10-CM | POA: Diagnosis not present

## 2018-08-07 DIAGNOSIS — F10120 Alcohol abuse with intoxication, uncomplicated: Secondary | ICD-10-CM | POA: Diagnosis not present

## 2018-08-07 DIAGNOSIS — F10920 Alcohol use, unspecified with intoxication, uncomplicated: Secondary | ICD-10-CM

## 2018-08-07 DIAGNOSIS — F209 Schizophrenia, unspecified: Secondary | ICD-10-CM | POA: Insufficient documentation

## 2018-08-07 LAB — LIPASE, BLOOD: Lipase: 41 U/L (ref 11–51)

## 2018-08-07 LAB — COMPREHENSIVE METABOLIC PANEL
ALK PHOS: 51 U/L (ref 38–126)
ALT: 25 U/L (ref 0–44)
AST: 27 U/L (ref 15–41)
Albumin: 4.2 g/dL (ref 3.5–5.0)
Anion gap: 9 (ref 5–15)
BUN: 13 mg/dL (ref 6–20)
CO2: 24 mmol/L (ref 22–32)
CREATININE: 0.91 mg/dL (ref 0.61–1.24)
Calcium: 9 mg/dL (ref 8.9–10.3)
Chloride: 111 mmol/L (ref 98–111)
Glucose, Bld: 114 mg/dL — ABNORMAL HIGH (ref 70–99)
Potassium: 3.9 mmol/L (ref 3.5–5.1)
SODIUM: 144 mmol/L (ref 135–145)
Total Bilirubin: 0.5 mg/dL (ref 0.3–1.2)
Total Protein: 7.2 g/dL (ref 6.5–8.1)

## 2018-08-07 LAB — CBC WITH DIFFERENTIAL/PLATELET
Basophils Absolute: 0 10*3/uL (ref 0.0–0.1)
Basophils Relative: 1 %
Eosinophils Absolute: 0 10*3/uL (ref 0.0–0.7)
Eosinophils Relative: 1 %
HCT: 42.3 % (ref 39.0–52.0)
HEMOGLOBIN: 14.7 g/dL (ref 13.0–17.0)
LYMPHS ABS: 1.1 10*3/uL (ref 0.7–4.0)
Lymphocytes Relative: 19 %
MCH: 28.4 pg (ref 26.0–34.0)
MCHC: 34.8 g/dL (ref 30.0–36.0)
MCV: 81.8 fL (ref 78.0–100.0)
Monocytes Absolute: 0.2 10*3/uL (ref 0.1–1.0)
Monocytes Relative: 4 %
NEUTROS PCT: 75 %
Neutro Abs: 4.2 10*3/uL (ref 1.7–7.7)
Platelets: 269 10*3/uL (ref 150–400)
RBC: 5.17 MIL/uL (ref 4.22–5.81)
RDW: 13.9 % (ref 11.5–15.5)
WBC: 5.5 10*3/uL (ref 4.0–10.5)

## 2018-08-07 LAB — RAPID URINE DRUG SCREEN, HOSP PERFORMED
Amphetamines: NOT DETECTED
Barbiturates: NOT DETECTED
Benzodiazepines: NOT DETECTED
Cocaine: NOT DETECTED
Opiates: NOT DETECTED
Tetrahydrocannabinol: POSITIVE — AB

## 2018-08-07 LAB — ETHANOL: ALCOHOL ETHYL (B): 237 mg/dL — AB (ref <10)

## 2018-08-07 MED ORDER — SODIUM CHLORIDE 0.9 % IV BOLUS
1000.0000 mL | Freq: Once | INTRAVENOUS | Status: AC
  Administered 2018-08-07: 1000 mL via INTRAVENOUS

## 2018-08-07 MED ORDER — ONDANSETRON HCL 4 MG/2ML IJ SOLN
4.0000 mg | Freq: Once | INTRAMUSCULAR | Status: AC
  Administered 2018-08-07: 4 mg via INTRAVENOUS
  Filled 2018-08-07: qty 2

## 2018-08-07 MED ORDER — SODIUM CHLORIDE 0.9 % IV SOLN
INTRAVENOUS | Status: DC
  Administered 2018-08-07: 10 mL/h via INTRAVENOUS

## 2018-08-07 NOTE — ED Provider Notes (Signed)
Pt is now awake and alert.  He is walking without difficulty and requests discharge.  Pt appears safe for discharge at this time.   Dorie Rank, MD 08/07/18 [phone]

## 2018-08-07 NOTE — ED Notes (Signed)
Bed: [facility] Expected date:  Expected time:  Means of arrival:  Comments: EMS/ETOH 
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
## 2018-08-07 NOTE — ED Notes (Signed)
Pt is awake and alert, asks to be pointed to the bathroom where he ambulates without assistance or difficulty, he asking to be discharged and to make a phone call for his ride. I have relayed these to the EDP.

## 2018-08-07 NOTE — Discharge Instructions (Addendum)
Please review the discharge instructions, avoid alcohol use in the future, follow-up with the primary care doctor

## 2018-10-06 ENCOUNTER — Emergency Department (HOSPITAL_COMMUNITY)
Admission: EM | Admit: 2018-10-06 | Discharge: 2018-10-06 | Disposition: A | Discharge status: Home / Self Care | Payer: Medicaid Other | Attending: Emergency Medicine | Admitting: Emergency Medicine

## 2018-10-06 ENCOUNTER — Encounter (HOSPITAL_COMMUNITY): Payer: Self-pay | Admitting: *Deleted

## 2018-10-06 ENCOUNTER — Other Ambulatory Visit: Payer: Self-pay

## 2018-10-06 ENCOUNTER — Emergency Department (HOSPITAL_COMMUNITY): Payer: Medicaid Other

## 2018-10-06 DIAGNOSIS — F17210 Nicotine dependence, cigarettes, uncomplicated: Secondary | ICD-10-CM | POA: Insufficient documentation

## 2018-10-06 DIAGNOSIS — Z79899 Other long term (current) drug therapy: Secondary | ICD-10-CM | POA: Diagnosis not present

## 2018-10-06 DIAGNOSIS — N4889 Other specified disorders of penis: Secondary | ICD-10-CM | POA: Insufficient documentation

## 2018-10-06 LAB — URINALYSIS, ROUTINE W REFLEX MICROSCOPIC
BILIRUBIN URINE: NEGATIVE
GLUCOSE, UA: NEGATIVE mg/dL
Hgb urine dipstick: NEGATIVE
KETONES UR: 5 mg/dL — AB
Nitrite: NEGATIVE
PH: 6 (ref 5.0–8.0)
Protein, ur: 30 mg/dL — AB
Specific Gravity, Urine: 1.034 — ABNORMAL HIGH (ref 1.005–1.030)

## 2018-10-06 MED ORDER — AZITHROMYCIN 250 MG PO TABS
1000.0000 mg | ORAL_TABLET | Freq: Once | ORAL | Status: AC
  Administered 2018-10-06: 1000 mg via ORAL
  Filled 2018-10-06: qty 4

## 2018-10-06 MED ORDER — CEFTRIAXONE SODIUM 250 MG IJ SOLR
250.0000 mg | Freq: Once | INTRAMUSCULAR | Status: AC
  Administered 2018-10-06: 250 mg via INTRAMUSCULAR
  Filled 2018-10-06: qty 250

## 2018-10-06 MED ORDER — STERILE WATER FOR INJECTION IJ SOLN
INTRAMUSCULAR | Status: AC
  Filled 2018-10-06: qty 10

## 2018-10-06 MED ORDER — IBUPROFEN 400 MG PO TABS
600.0000 mg | ORAL_TABLET | Freq: Once | ORAL | Status: DC
  Filled 2018-10-06: qty 1
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
## 2018-10-06 NOTE — ED Triage Notes (Signed)
Pt had unprotected sex last night and now has penis pain.

## 2018-10-06 NOTE — Discharge Instructions (Addendum)
You have been treated for an STD in the ED today.  Your cultures will come back in a positive be notified.  Your HIV and syphilis test are pending at this time and was notified if they are positive.  Please perform safe sex practices.  Avoid sexual activity for the next 14 days for resolution of your symptoms.  May follow-up with health department for further testing and treatment.

## 2018-10-06 NOTE — ED Notes (Signed)
Declined W/C at D/C and was escorted to lobby by RN. 

## 2018-10-07 LAB — URINE CULTURE

## 2018-10-07 LAB — RPR: RPR: NONREACTIVE

## 2018-10-07 LAB — HIV ANTIBODY (ROUTINE TESTING W REFLEX): HIV Screen 4th Generation wRfx: NONREACTIVE

## 2018-10-30 ENCOUNTER — Encounter (HOSPITAL_COMMUNITY): Payer: Self-pay | Admitting: Nurse Practitioner

## 2018-10-30 ENCOUNTER — Emergency Department (HOSPITAL_COMMUNITY)
Admission: EM | Admit: 2018-10-30 | Discharge: 2018-10-31 | Disposition: A | Discharge status: Home / Self Care | Payer: Medicaid Other | Attending: Emergency Medicine | Admitting: Emergency Medicine

## 2018-10-30 DIAGNOSIS — F10920 Alcohol use, unspecified with intoxication, uncomplicated: Secondary | ICD-10-CM | POA: Diagnosis not present

## 2018-10-30 DIAGNOSIS — E876 Hypokalemia: Secondary | ICD-10-CM | POA: Insufficient documentation

## 2018-10-30 DIAGNOSIS — Y907 Blood alcohol level of 200-239 mg/100 ml: Secondary | ICD-10-CM | POA: Insufficient documentation

## 2018-10-30 DIAGNOSIS — F10929 Alcohol use, unspecified with intoxication, unspecified: Secondary | ICD-10-CM | POA: Diagnosis present

## 2018-10-30 DIAGNOSIS — Z79899 Other long term (current) drug therapy: Secondary | ICD-10-CM | POA: Diagnosis not present

## 2018-10-30 DIAGNOSIS — F17210 Nicotine dependence, cigarettes, uncomplicated: Secondary | ICD-10-CM | POA: Insufficient documentation

## 2018-10-30 LAB — BASIC METABOLIC PANEL
Anion gap: 10 (ref 5–15)
BUN: 10 mg/dL (ref 6–20)
CALCIUM: 8.8 mg/dL — AB (ref 8.9–10.3)
CHLORIDE: 106 mmol/L (ref 98–111)
CO2: 22 mmol/L (ref 22–32)
CREATININE: 0.79 mg/dL (ref 0.61–1.24)
GFR calc Af Amer: >60 mL/min (ref >60)
GFR calc non Af Amer: >60 mL/min (ref >60)
GLUCOSE: 115 mg/dL — AB (ref 70–99)
Potassium: 3 mmol/L — ABNORMAL LOW (ref 3.5–5.1)
Sodium: 138 mmol/L (ref 135–145)

## 2018-10-30 LAB — CBC WITH DIFFERENTIAL/PLATELET
Abs Immature Granulocytes: 0.08 10*3/uL — ABNORMAL HIGH (ref 0.00–0.07)
Basophils Absolute: 0.1 10*3/uL (ref 0.0–0.1)
Basophils Relative: 1 %
EOS PCT: 2 %
Eosinophils Absolute: 0.2 10*3/uL (ref 0.0–0.5)
HEMATOCRIT: 44.5 % (ref 39.0–52.0)
HEMOGLOBIN: 14.7 g/dL (ref 13.0–17.0)
Immature Granulocytes: 1 %
LYMPHS ABS: 2.2 10*3/uL (ref 0.7–4.0)
LYMPHS PCT: 19 %
MCH: 28.1 pg (ref 26.0–34.0)
MCHC: 33 g/dL (ref 30.0–36.0)
MCV: 84.9 fL (ref 80.0–100.0)
MONOS PCT: 6 %
Monocytes Absolute: 0.6 10*3/uL (ref 0.1–1.0)
Neutro Abs: 8.1 10*3/uL — ABNORMAL HIGH (ref 1.7–7.7)
Neutrophils Relative %: 71 %
Platelets: 232 10*3/uL (ref 150–400)
RBC: 5.24 MIL/uL (ref 4.22–5.81)
RDW: 13.8 % (ref 11.5–15.5)
WBC: 11.2 10*3/uL — ABNORMAL HIGH (ref 4.0–10.5)
nRBC: 0 % (ref 0.0–0.2)

## 2018-10-30 LAB — ETHANOL: Alcohol, Ethyl (B): 214 mg/dL — ABNORMAL HIGH (ref <10)

## 2018-10-30 LAB — CBG MONITORING, ED: Glucose-Capillary: 112 mg/dL — ABNORMAL HIGH (ref 70–99)

## 2018-10-30 NOTE — ED Notes (Signed)
Bed: [facility] Expected date:  Expected time:  Means of arrival:  Comments: 54 M ETOH

## 2018-10-30 NOTE — ED Triage Notes (Signed)
Pt is brought in by EMS for alcohol intoxication.

## 2018-10-30 NOTE — ED Provider Notes (Signed)
[facility] DEPT Provider Note   CSN: [id number] Arrival date & time: 10/30/18  2213     History   Chief Complaint Chief Complaint  Patient presents with  . Alcohol Intoxication    HPI Tyler White is a 22 y.o. male.  Level 5 caveat secondary to altered mental status.  22 year old male brought here by EMS for possible alcohol intoxication.  Patient himself is minimally responsive and is unable to provide any history.  The history is provided by the EMS personnel.  Alcohol Intoxication  This is a new problem. The problem has not changed since onset.The treatment provided no relief.    Past Medical History:  Diagnosis Date  . Hernia, inguinal, right   . Inguinal hernia    right  . Psychiatric illness     Patient Active Problem List   Diagnosis Date Noted  . Cocaine abuse ([place]) 02/19/2018  . Cocaine abuse with cocaine-induced mood disorder ([place]) 02/19/2018  . Psychosis ([place]) 11/27/2017  . Cannabis abuse with psychotic disorder, with delusions ([place]) 11/26/2017  . Schizophrenia, unspecified ([place]) 11/26/2017    History reviewed. No pertinent surgical history.      Home Medications    Prior to Admission medications   Medication Sig Start Date End Date Taking? Authorizing Provider  fluticasone (FLONASE) 50 MCG/ACT nasal spray Place 1 spray into both nostrils daily. For allergies Patient not taking: Reported on 10/06/2018 06/03/18   Lindell Spar I, NP  nicotine (NICODERM CQ - DOSED IN MG/24 HOURS) 21 mg/24hr patch Place 1 patch (21 mg total) onto the skin daily. (May purchase from over the counter at the pharmacy): For smoking cessation Patient not taking: Reported on 10/06/2018 06/02/18   Lindell Spar I, NP  paliperidone (INVEGA SUSTENNA) 156 MG/ML SUSY injection Inject 1 mL (156 mg total) into the muscle every 28 (twenty-eight) days. (Due on 06-29-18): For mood control Patient not taking: Reported on 10/06/2018 06/29/18   Lindell Spar I, NP    paliperidone (INVEGA) 6 MG 24 hr tablet Take 1 tablet (6 mg total) by mouth daily. For mood control Patient not taking: Reported on 10/06/2018 06/03/18   Lindell Spar I, NP  thiamine 100 MG tablet Take 1 tablet (100 mg total) by mouth daily. For thiamine replacement 06/03/18   Lindell Spar I, NP  traZODone (DESYREL) 100 MG tablet Take 1 tablet (100 mg total) by mouth at bedtime as needed for sleep. 06/02/18   Encarnacion Slates, NP    Family History Family History  Problem Relation Age of Onset  . Psychiatric Illness Mother   . Hypertension Sister     Social History Social History   Tobacco Use  . Smoking status: Current Every Day Smoker    Packs/day: 0.50    Years: 5.00    Pack years: 2.50    Types: Cigarettes  . Smokeless tobacco: Never Used  Substance Use Topics  . Alcohol use: Yes  . Drug use: Yes    Types: Marijuana     Allergies   Patient has no known allergies.   Review of Systems Review of Systems  Unable to perform ROS: Mental status change     Physical Exam Updated Vital Signs BP 128/84 (BP Location: Left Arm)   Pulse 87   Resp 15   SpO2 100%   Physical Exam  Constitutional: He appears well-developed and well-nourished.  HENT:  Head: Normocephalic and atraumatic.  Right Ear: External ear normal.  Left Ear: External ear normal.  Nose:  Nose normal.  Eyes: Conjunctivae are normal.  Neck: Neck supple.  Cardiovascular: Normal rate, regular rhythm and normal heart sounds.  Pulmonary/Chest: Effort normal.  Abdominal: Soft. There is no tenderness. There is no guarding.  Musculoskeletal: Normal range of motion. He exhibits no deformity.  Neurological: He is unresponsive. GCS eye subscore is 2. GCS verbal subscore is 2. GCS motor subscore is 4.  Skin: Skin is warm and dry.  Psychiatric: He has a normal mood and affect.  Nursing note and vitals reviewed.    ED Treatments / Results  Labs (all labs ordered are listed, but only abnormal results are  displayed) Labs Reviewed  ETHANOL - Abnormal; Notable for the following components:      Result Value   Alcohol, Ethyl (B) 214 (*)    All other components within normal limits  BASIC METABOLIC PANEL - Abnormal; Notable for the following components:   Potassium 3.0 (*)    Glucose, Bld 115 (*)    Calcium 8.8 (*)    All other components within normal limits  CBC WITH DIFFERENTIAL/PLATELET - Abnormal; Notable for the following components:   WBC 11.2 (*)    Neutro Abs 8.1 (*)    Abs Immature Granulocytes 0.08 (*)    All other components within normal limits  CBG MONITORING, ED - Abnormal; Notable for the following components:   Glucose-Capillary 112 (*)    All other components within normal limits    EKG None  Radiology No results found.  Procedures Procedures (including critical care time)  Medications Ordered in ED Medications - No data to display   Initial Impression / Assessment and Plan / ED Course  I have reviewed the triage vital signs and the nursing notes.  Pertinent labs & imaging results that were available during my care of the patient were reviewed by me and considered in my medical decision making (see chart for details).  Clinical Course as of Oct 31 2227  Ludwig Clarks Oct 30, 2018  2227 On review of prior medical record patient has been seen here before for alcohol intoxication.  He also has a history of psychiatric disease and cocaine abuse.   [MB]    Clinical Course User Index [MB] Hayden Rasmussen, MD     Final Clinical Impressions(s) / ED Diagnoses   Final diagnoses:  Alcoholic intoxication without complication ([place])  Hypokalemia    ED Discharge Orders         Ordered    potassium chloride SA (K-DUR,KLOR-CON) 20 MEQ tablet  Daily     10/31/18 0559           Hayden Rasmussen, MD 10/31/18 1315

## 2018-10-31 MED ORDER — POTASSIUM CHLORIDE CRYS ER 20 MEQ PO TBCR
40.0000 meq | EXTENDED_RELEASE_TABLET | Freq: Once | ORAL | Status: AC
  Administered 2018-10-31: 40 meq via ORAL
  Filled 2018-10-31: qty 2

## 2018-10-31 MED ORDER — POTASSIUM CHLORIDE CRYS ER 20 MEQ PO TBCR: 40.0000 meq | EXTENDED_RELEASE_TABLET | Freq: Every day | ORAL | 0 refills | Status: DC

## 2018-10-31 NOTE — ED Notes (Signed)
Patient ambulated to and from bathroom with minimal assistance. Will continue to monitor.

## 2018-10-31 NOTE — ED Provider Notes (Signed)
5:93 AM Patient awake and alert, able to ambulate without difficulty.     Tyler White, [name], MD 10/31/18 [phone]

## 2018-11-16 LAB — GC/CHLAMYDIA PROBE AMP (~~LOC~~) NOT AT ARMC
Chlamydia: NEGATIVE
Neisseria Gonorrhea: NEGATIVE

## 2019-02-13 ENCOUNTER — Emergency Department (HOSPITAL_COMMUNITY)
Admission: EM | Admit: 2019-02-13 | Discharge: 2019-02-13 | Disposition: A | Discharge status: Home / Self Care | Payer: Medicaid Other | Attending: Emergency Medicine | Admitting: Emergency Medicine

## 2019-02-13 ENCOUNTER — Encounter (HOSPITAL_COMMUNITY): Payer: Self-pay | Admitting: *Deleted

## 2019-02-13 ENCOUNTER — Emergency Department (HOSPITAL_COMMUNITY): Payer: Medicaid Other

## 2019-02-13 DIAGNOSIS — F17210 Nicotine dependence, cigarettes, uncomplicated: Secondary | ICD-10-CM | POA: Diagnosis not present

## 2019-02-13 DIAGNOSIS — S61412A Laceration without foreign body of left hand, initial encounter: Secondary | ICD-10-CM | POA: Diagnosis present

## 2019-02-13 DIAGNOSIS — Y999 Unspecified external cause status: Secondary | ICD-10-CM | POA: Diagnosis not present

## 2019-02-13 DIAGNOSIS — Y929 Unspecified place or not applicable: Secondary | ICD-10-CM | POA: Insufficient documentation

## 2019-02-13 DIAGNOSIS — Z79899 Other long term (current) drug therapy: Secondary | ICD-10-CM | POA: Insufficient documentation

## 2019-02-13 DIAGNOSIS — Y9389 Activity, other specified: Secondary | ICD-10-CM | POA: Insufficient documentation

## 2019-02-13 MED ORDER — TETANUS-DIPHTH-ACELL PERTUSSIS 5-2.5-18.5 LF-MCG/0.5 IM SUSP: 0.5000 mL | Freq: Once | INTRAMUSCULAR | Status: DC

## 2019-02-13 MED ORDER — BACITRACIN ZINC 500 UNIT/GM EX OINT
TOPICAL_OINTMENT | Freq: Two times a day (BID) | CUTANEOUS | Status: DC
  Administered 2019-02-13: 12:00:00 via TOPICAL

## 2019-02-13 NOTE — ED Notes (Signed)
Patient verbalizes understanding of discharge instructions. Opportunity for questioning and answers were provided. Armband removed by staff, pt discharged from ED.  

## 2019-02-13 NOTE — Discharge Instructions (Addendum)
Keep wounds clean and dry
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
## 2020-04-28 DIAGNOSIS — F1210 Cannabis abuse, uncomplicated: Secondary | ICD-10-CM | POA: Insufficient documentation

## 2020-04-28 DIAGNOSIS — F1410 Cocaine abuse, uncomplicated: Secondary | ICD-10-CM | POA: Insufficient documentation

## 2020-09-25 ENCOUNTER — Encounter (HOSPITAL_COMMUNITY): Payer: Self-pay | Admitting: Emergency Medicine

## 2020-09-25 ENCOUNTER — Emergency Department (HOSPITAL_COMMUNITY)
Admission: EM | Admit: 2020-09-25 | Discharge: 2020-09-25 | Discharge status: Against Medical Advice | Payer: Medicaid Other

## 2020-09-25 ENCOUNTER — Emergency Department (HOSPITAL_COMMUNITY)
Admission: EM | Admit: 2020-09-25 | Discharge: 2020-09-26 | Disposition: A | Discharge status: Home / Self Care | Payer: Medicaid Other | Attending: Emergency Medicine | Admitting: Emergency Medicine

## 2020-09-25 ENCOUNTER — Other Ambulatory Visit: Payer: Self-pay

## 2020-09-25 DIAGNOSIS — F209 Schizophrenia, unspecified: Secondary | ICD-10-CM | POA: Insufficient documentation

## 2020-09-25 DIAGNOSIS — M25521 Pain in right elbow: Secondary | ICD-10-CM | POA: Insufficient documentation

## 2020-09-25 DIAGNOSIS — Y9389 Activity, other specified: Secondary | ICD-10-CM | POA: Insufficient documentation

## 2020-09-25 DIAGNOSIS — Y92410 Unspecified street and highway as the place of occurrence of the external cause: Secondary | ICD-10-CM | POA: Insufficient documentation

## 2020-09-25 NOTE — ED Notes (Signed)
Pt going to Urgent Care. Pt will be moved OTF.
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
## 2020-09-26 ENCOUNTER — Ambulatory Visit: Payer: Self-pay

## 2020-09-26 NOTE — ED Notes (Signed)
Discharge papers discussed with pt caregiver. Discussed s/sx to return, follow up with PCP, medications given/next dose due. Caregiver verbalized understanding.  ?

## 2020-09-26 NOTE — ED Notes (Signed)
ED Provider at bedside. 

## 2020-09-26 NOTE — Discharge Instructions (Addendum)
Use Tylenol and ice as needed for pain. Return for new or worsening symptoms. Please make sure to wear seatbelt in vehicles.
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
## 2020-11-02 ENCOUNTER — Ambulatory Visit (HOSPITAL_COMMUNITY)
Admission: EM | Admit: 2020-11-02 | Discharge: 2020-11-03 | Disposition: A | Discharge status: Home / Self Care | Payer: Medicaid Other | Attending: Psychiatry | Admitting: Psychiatry

## 2020-11-02 ENCOUNTER — Other Ambulatory Visit: Payer: Self-pay

## 2020-11-02 DIAGNOSIS — F209 Schizophrenia, unspecified: Secondary | ICD-10-CM | POA: Insufficient documentation

## 2020-11-02 DIAGNOSIS — Z20822 Contact with and (suspected) exposure to covid-19: Secondary | ICD-10-CM | POA: Insufficient documentation

## 2020-11-02 DIAGNOSIS — F1210 Cannabis abuse, uncomplicated: Secondary | ICD-10-CM | POA: Insufficient documentation

## 2020-11-02 DIAGNOSIS — F17210 Nicotine dependence, cigarettes, uncomplicated: Secondary | ICD-10-CM | POA: Insufficient documentation

## 2020-11-02 LAB — HEMOGLOBIN A1C
Hgb A1c MFr Bld: 5.1 % (ref 4.8–5.6)
Mean Plasma Glucose: 99.67 mg/dL

## 2020-11-02 LAB — ETHANOL: Alcohol, Ethyl (B): 82 mg/dL — ABNORMAL HIGH

## 2020-11-02 LAB — CBC WITH DIFFERENTIAL/PLATELET
Abs Immature Granulocytes: 0.03 K/uL (ref 0.00–0.07)
Basophils Absolute: 0.1 K/uL (ref 0.0–0.1)
Basophils Relative: 1 %
Eosinophils Absolute: 0.3 K/uL (ref 0.0–0.5)
Eosinophils Relative: 3 %
HCT: 39.4 % (ref 39.0–52.0)
Hemoglobin: 13.5 g/dL (ref 13.0–17.0)
Immature Granulocytes: 0 %
Lymphocytes Relative: 26 %
Lymphs Abs: 2.4 K/uL (ref 0.7–4.0)
MCH: 28.2 pg (ref 26.0–34.0)
MCHC: 34.3 g/dL (ref 30.0–36.0)
MCV: 82.4 fL (ref 80.0–100.0)
Monocytes Absolute: 0.6 K/uL (ref 0.1–1.0)
Monocytes Relative: 7 %
Neutro Abs: 5.9 K/uL (ref 1.7–7.7)
Neutrophils Relative %: 63 %
Platelets: 328 K/uL (ref 150–400)
RBC: 4.78 MIL/uL (ref 4.22–5.81)
RDW: 13.3 % (ref 11.5–15.5)
WBC: 9.3 K/uL (ref 4.0–10.5)
nRBC: 0 % (ref 0.0–0.2)

## 2020-11-02 LAB — LIPID PANEL
Cholesterol: 148 mg/dL (ref 0–200)
HDL: 71 mg/dL
LDL Cholesterol: 68 mg/dL (ref 0–99)
Total CHOL/HDL Ratio: 2.1 ratio
Triglycerides: 47 mg/dL
VLDL: 9 mg/dL (ref 0–40)

## 2020-11-02 LAB — POC SARS CORONAVIRUS 2 AG: SARS Coronavirus 2 Ag: NEGATIVE

## 2020-11-02 LAB — TSH: TSH: 0.658 u[IU]/mL (ref 0.350–4.500)

## 2020-11-02 LAB — POCT URINE DRUG SCREEN - MANUAL ENTRY (I-SCREEN)
POC Amphetamine UR: NOT DETECTED
POC Buprenorphine (BUP): NOT DETECTED
POC Cocaine UR: NOT DETECTED
POC Marijuana UR: POSITIVE — AB
POC Methadone UR: NOT DETECTED
POC Methamphetamine UR: NOT DETECTED
POC Morphine: NOT DETECTED
POC Oxazepam (BZO): NOT DETECTED
POC Oxycodone UR: NOT DETECTED
POC Secobarbital (BAR): NOT DETECTED

## 2020-11-02 LAB — RESPIRATORY PANEL BY RT PCR (FLU A&B, COVID)
Influenza A by PCR: NEGATIVE
Influenza B by PCR: NEGATIVE
SARS Coronavirus 2 by RT PCR: NEGATIVE

## 2020-11-02 MED ORDER — TRAZODONE HCL 50 MG PO TABS
50.0000 mg | ORAL_TABLET | Freq: Every evening | ORAL | Status: DC | PRN
  Administered 2020-11-02: 50 mg via ORAL
  Filled 2020-11-02: qty 1

## 2020-11-02 MED ORDER — ALUM & MAG HYDROXIDE-SIMETH 200-200-20 MG/5ML PO SUSP: 30.0000 mL | ORAL | Status: DC | PRN

## 2020-11-02 MED ORDER — ACETAMINOPHEN 325 MG PO TABS: 650.0000 mg | ORAL_TABLET | Freq: Four times a day (QID) | ORAL | Status: DC | PRN

## 2020-11-02 MED ORDER — ARIPIPRAZOLE 10 MG PO TABS
10.0000 mg | ORAL_TABLET | Freq: Every day | ORAL | Status: DC
  Administered 2020-11-02 – 2020-11-03 (×2): 10 mg via ORAL
  Filled 2020-11-02 (×2): qty 1
  Filled 2020-11-02: qty 7

## 2020-11-02 MED ORDER — HYDROXYZINE HCL 25 MG PO TABS
25.0000 mg | ORAL_TABLET | Freq: Three times a day (TID) | ORAL | Status: DC | PRN
  Administered 2020-11-02: 25 mg via ORAL
  Filled 2020-11-02: qty 1

## 2020-11-02 MED ORDER — MAGNESIUM HYDROXIDE 400 MG/5ML PO SUSP: 30.0000 mL | Freq: Every day | ORAL | Status: DC | PRN

## 2020-11-02 NOTE — ED Notes (Signed)
Pt is awake. He has blank affect. Speaks very softly nodes head to answer question

## 2020-11-02 NOTE — ED Provider Notes (Addendum)
Behavioral Health Admission [facility]&P [facility] & OBS)  Date: 11/02/20 Patient Name: Tyler White MRN: [id number] Chief Complaint:  Chief Complaint  Patient presents with  . Psychiatric Evaluation    Pt nodded his head yes to SI and HI, mostly mute   Chief Complaint/Presenting Problem: and has been hospitalized at [facility] on several occasions.  He has been off his medications for the past three months, smoking marijuana and decompensating. Patient is denying SI/HI/Psychosis, but is not currently a good historian and is not willing to communicate much, but will answer questions with a great deal of prompting.  Diagnoses:  Final diagnoses:  None    HPI:  24 yo male with h/o schizophrenia, cannabis use disorder  who presented to the [facility] via law enforcement as family requested he be brought in for evaluation. On interview, patient is minimally participatory, guarded, makes minimal eye contact, +thought blocking, he is disheveled and malodorous and appears pre occupied. Patient indicates that he believes that his father called the police because he "was worried". Patient unable/unwilling to state why father was worried, denied saying or doing anything that would concern him apart from "I hadn't called him in awhile". Pt states that he lives with his 50 yo brother and provided phone number (see below). Pt admits to paranoia and feels that people have been following him/watching him. Paranoia is not directed at anyone in particular, denies that there is anything special or unusual about him that would make him of interest to others  Denies TI/TW/TB. Patient recalls previously getting invega LAI but states he did not find it helpful because it resulted in "marks on my leg". Pt unable/unwilling to clarify. Patient amenable to get restarted on medications and possibly get LAI., Discussed abilify, patient agrees to medication initiation and staying in the hospital.. Pt non participatory with remainder of questions and  interview was terminated.  On chart review, patient was admitted to [facility] from 04/26/20-05/02/2020 after presenting with chief complaint of "seizures". Pt reported at that time to smoking marijuana to help with his mood and was noted to be preoccupied, disorganized, and bizarre. He was restarted on PO invega and transitioned to LAI; received 234 on 4/30 followed by 156 on 5/4. He was to follow up at [facility]. Prior to that, he was admitted to [facility] in 2019; 05/28/2018-06/02/2018, dx with schizophrenia and discharged with invega LAI and was to follow up at [facility].   TTS spoke to patient's father who stated that patient has not been compliant with medications and has been deteriorating. When he decompensates he does not care for himself and becomes non verbal. States that his brother who lives with him called the police because he "wandered off". States that he has tried to call his son (pt's brother and roommate) but he has not been answering his phone. See TTS note for full details  [phone] (brother) -number patient provided for brother- When called received automated voice message for a company and given option to log into mail box    History obtained as able from patient, but also from chart review d/t patient's ability to participate at this time Past Psychiatric History: Previous Medication Trials: yes, invega Previous Psychiatric Hospitalizations: yes, 2018 and 2019 at [facility]; 2021 at [place] Previous Suicide Attempts: none per chart review History of Violence: unknown Outpatient psychiatrist: none  Social History: Marital Status: unknown Children: unknown Source of Income: unknown, not currently employed, per chart review he "watches the house" and helps  out with chores Education: 10th grade Special Ed: unknown Housing Status: with brother History of phys/sexual abuse: no per chart review Easy access to gun: UTA  Substance Use (with emphasis over  the last 12 months) Recreational Drugs: {marijuana Use of Alcohol: uta Tobacco Use: uta Rehab History: uta H/O Complicated Withdrawal: uta  Legal History: Past Charges/Incarcerations: uta Pending charges: uta  Family Psychiatric History: Mother with "psychiatric illness" per chart   PHQ 2-9:     Admission (Discharged) from 05/28/2018 in [place] [facility] ED from 05/26/2018 in [place] High Risk Moderate Risk       Total Time spent with patient: 30 minutes  Musculoskeletal  Strength & Muscle Tone: within normal limits Gait & Station: normal Patient leans: N/A  Psychiatric Specialty Exam  Presentation General Appearance: Bizarre;Disheveled  Eye Contact:None  Speech:Blocked;Other (comment) (poverty of speech)  Speech Volume:Decreased  Handedness:No data recorded  Mood and Affect  Mood:Dysphoric ("i dont know")  Affect:Flat;Other (comment) (dysphoric)   Thought Process  Thought Processes:Other (comment) (thought blocking)  Descriptions of Associations:Intact  Orientation:Other (comment) (oriented to self and location. states "i don't know" to time questions)  Thought Content:Paranoid Ideation (poverty of thought)  Hallucinations:Hallucinations: None  Ideas of Reference:None  Suicidal Thoughts:Suicidal Thoughts: No  Homicidal Thoughts:Homicidal Thoughts: No   Sensorium  Memory:Immediate Poor;Recent Poor;Remote Poor  Judgment:Poor  Insight:Poor   Executive Functions  Concentration:Poor  Attention Span:Fair  [place] recorded Language:Other (comment) (UTA)   Psychomotor Activity  Psychomotor Activity:Psychomotor Activity: Decreased   Assets  Assets:Resilience;Social Support   Sleep  Sleep:Sleep: Fair   Physical Exam Constitutional:      Appearance: Normal appearance.  HENT:     Head: Normocephalic and  atraumatic.  Eyes:     Extraocular Movements: Extraocular movements intact.  Pulmonary:     Effort: Pulmonary effort is normal.  Neurological:     Mental Status: He is alert.    Review of Systems  Unable to perform ROS: Psychiatric disorder (Patient guarded, minimally participatory. Not providing answers to questions)    Blood pressure 133/90, pulse 77, temperature 97.8 F (36.6 C), temperature source Oral, resp. rate 18, height 5\' 8"  (1.727 m), weight 59 kg, SpO2 100 %. Body mass index is 19.77 kg/m.  Past Psychiatric History:    Is the patient at risk to self? No  Has the patient been a risk to self in the past 6 months? No .    Has the patient been a risk to self within the distant past? No   Is the patient a risk to others? No   Has the patient been a risk to others in the past 6 months? No   Has the patient been a risk to others within the distant past? No   Past Medical History:  Past Medical History:  Diagnosis Date  . Hernia, inguinal, right   . Inguinal hernia    right  . Psychiatric illness    No past surgical history on file.  Family History:  Family History  Problem Relation Age of Onset  . Psychiatric Illness Mother   . Hypertension Sister     Social History:  Social History   Socioeconomic History  . Marital status: Single    Spouse name: Not on file  . Number of children: Not on file  . Years of education: Not on file  . Highest education level: Not on file  Occupational  History  . Not on file  Tobacco Use  . Smoking status: Current Every Day Smoker    Packs/day: 0.50    Years: 5.00    Pack years: 2.50    Types: Cigarettes  . Smokeless tobacco: Never Used  Vaping Use  . Vaping Use: Never used  Substance and Sexual Activity  . Alcohol use: Yes  . Drug use: Yes    Types: Marijuana  . Sexual activity: Yes    Birth control/protection: None  Other Topics Concern  . Not on file  Social History Narrative   ** Merged History Encounter **        Social Determinants of Health   Financial Resource Strain:   . Difficulty of Paying Living Expenses: Not on file  Food Insecurity:   . Worried About Charity fundraiser in the Last Year: Not on file  . Ran Out of Food in the Last Year: Not on file  Transportation Needs:   . Lack of Transportation (Medical): Not on file  . Lack of Transportation (Non-Medical): Not on file  Physical Activity:   . Days of Exercise per Week: Not on file  . Minutes of Exercise per Session: Not on file  Stress:   . Feeling of Stress : Not on file  Social Connections:   . Frequency of Communication with Friends and Family: Not on file  . Frequency of Social Gatherings with Friends and Family: Not on file  . Attends Religious Services: Not on file  . Active Member of Clubs or Organizations: Not on file  . Attends Archivist Meetings: Not on file  . Marital Status: Not on file  Intimate Partner Violence:   . Fear of Current or Ex-Partner: Not on file  . Emotionally Abused: Not on file  . Physically Abused: Not on file  . Sexually Abused: Not on file    SDOH:  SDOH Screenings   Alcohol Screen:   . Last Alcohol Screening Score (AUDIT): Not on file  Depression (PHQ2-9):   . PHQ-2 Score: Not on file  Financial Resource Strain:   . Difficulty of Paying Living Expenses: Not on file  Food Insecurity:   . Worried About Charity fundraiser in the Last Year: Not on file  . Ran Out of Food in the Last Year: Not on file  Housing:   . Last Housing Risk Score: Not on file  Physical Activity:   . Days of Exercise per Week: Not on file  . Minutes of Exercise per Session: Not on file  Social Connections:   . Frequency of Communication with Friends and Family: Not on file  . Frequency of Social Gatherings with Friends and Family: Not on file  . Attends Religious Services: Not on file  . Active Member of Clubs or Organizations: Not on file  . Attends Archivist Meetings: Not on  file  . Marital Status: Not on file  Stress:   . Feeling of Stress : Not on file  Tobacco Use: High Risk  . Smoking Tobacco Use: Current Every Day Smoker  . Smokeless Tobacco Use: Never Used  Transportation Needs:   . Film/video editor (Medical): Not on file  . Lack of Transportation (Non-Medical): Not on file    Last Labs:  Admission on 11/02/2020  Component Date Value Ref Range Status  . POC Amphetamine UR 11/02/2020 None Detected  None Detected Final  . POC Secobarbital (BAR) 11/02/2020 None Detected  None Detected Final  .  POC Buprenorphine (BUP) 11/02/2020 None Detected  None Detected Final  . POC Oxazepam (BZO) 11/02/2020 None Detected  None Detected Final  . POC Cocaine UR 11/02/2020 None Detected  None Detected Final  . POC Methamphetamine UR 11/02/2020 None Detected  None Detected Final  . POC Morphine 11/02/2020 None Detected  None Detected Final  . POC Oxycodone UR 11/02/2020 None Detected  None Detected Final  . POC Methadone UR 11/02/2020 None Detected  None Detected Final  . POC Marijuana UR 11/02/2020 Positive* None Detected Final  . SARS Coronavirus 2 Ag 11/02/2020 NEGATIVE  NEGATIVE Final   Comment: (NOTE) SARS-CoV-2 antigen NOT DETECTED.   Negative results are presumptive.  Negative results do not preclude SARS-CoV-2 infection and should not be used as the sole basis for treatment or other patient management decisions, including infection  control decisions, particularly in the presence of clinical signs and  symptoms consistent with COVID-19, or in those who have been in contact with the virus.  Negative results must be combined with clinical observations, patient history, and epidemiological information. The expected result is Negative.  Fact Sheet for Patients: PodPark.tn  Fact Sheet for Healthcare Providers: GiftContent.is   This test is not yet approved or cleared by the [place] FDA  and  has been authorized for detection and/or diagnosis of SARS-CoV-2 by FDA under an Emergency Use Authorization (EUA).  This EUA will remain in effect (meaning this test can be used) for the duration of  the C                          OVID-19 declaration under Section 564(b)(1) of the Act, 21 U.S.C. section 360bbb-3(b)(1), unless the authorization is terminated or revoked sooner.      Allergies: Patient has no known allergies.  PTA Medications: (Not in a hospital admission)   Medical Decision Making  Patient psychotic in the setting of medication non compliance and drug use Admit for safety and stabilization; will re-initiate antipsychotic treatment. Plan to seek inpatient admission if patient's psychosis not improved -ordering routine labwork CBC, CMP, TSH, a1c, lipids, UDS, covid test -starting abilify 10 mg qd, plan to start LAI if tolerates well     Recommendations  Based on my evaluation the patient does not appear to have an emergency medical condition.  Patient psychotic, admitted for safety,stabilization, and reinitation of medicaiton.  Ival Bible, MD 11/02/20  4:34 PM

## 2020-11-02 NOTE — BH Assessment (Addendum)
Comprehensive Clinical Assessment (CCA) Note  11/02/2020 Tyler White [id number]   Patient was brought to the [facility] by the police department for psychiatric evaluation after his family had him picked up because of his bizarre behavior, not communicating, not taking care of his ADLs.  Patient has been diagnosed with schizophrenia and has been hospitalized at [facility] on several occasions.  He has been off his medications for the past three months, smoking marijuana and decompensating. Patient is denying SI/HI/Psychosis, but is not currently a good historian and is not willing to communicate much, but will answer questions with a great deal of prompting.  TTS spoke to to patient's father , Kainon Varady, [phone]. Who states that patient has been very depressed and decompensating. Father states that patient left his house three months ago and has been living with his brother.  Father states that patient starts neglecting his hygiene and stops communicating right before he has a break.  He states that patient has been off his medications for the past 2-3 months.  He states that when patient is taking his medication that he is a different person.  He states that he has never known patient to be a harm to himself or to others.  Patient presented with a flat and blunted affect.  He was slow to answer questions and his answers were very limited and he did not elaborate much.  His judgment, insight and impulse control appear to be impaired.  He is posibly responding to internal stimuli and he appeared to be disorganized.   Chief Complaint:  Chief Complaint  Patient presents with  . Psychiatric Evaluation    Pt nodded his head yes to SI and HI, mostly mute   Visit Diagnosis:  F20.9 Schizophrenia   CCA Screening, Triage and Referral (STR)  Patient Reported Information How did you hear about [place]? Legal System  Referral name: [facility] Department  Referral phone number: No data  recorded  Whom do you see for routine medical problems? I don't have a doctor  Practice/Facility Name: No data recorded Practice/Facility Phone Number: No data recorded Name of Contact: No data recorded Contact Number: No data recorded Contact Fax Number: No data recorded Prescriber Name: No data recorded Prescriber Address (if known): No data recorded  What Is the Reason for Your Visit/Call Today? Patient was brought to the [facility] by the police for psychiatric evaluation  How Long Has This Been Causing You Problems? 1-6 months  What Do You Feel Would Help You the Most Today? Therapy;Medication   Have You Recently Been in Any Inpatient Treatment (Hospital/Detox/Crisis Center/28-Day Program)? No  Name/Location of Program/Hospital:No data recorded How Long Were You There? No data recorded When Were You Discharged? No data recorded  Have You Ever Received Services From [facility] Before? Yes  Who Do You See at [facility]? Has been admitted to [facility] 2-3 times in the past   Have You Recently Had Any Thoughts About [place]? No  Are You Planning to Commit Suicide/Harm Yourself At This time? No   Have you Recently Had Thoughts About [place]? No  Explanation: No data recorded  Have You Used Any Alcohol or Drugs in the Past 24 Hours? Yes  How Long Ago Did You Use Drugs or Alcohol? No data recorded What Did You Use and How Much? Family reports that patient uses marijuana on a regular basis   Do You Currently Have a Therapist/Psychiatrist? No  Name of Therapist/Psychiatrist: No data recorded  Have You  Been Recently Discharged From Any Office Practice or Programs? No  Explanation of Discharge From Practice/Program: No data recorded    CCA Screening Triage Referral Assessment Type of Contact: Face-to-Face  Is this Initial or Reassessment? No data recorded Date Telepsych consult ordered in [facility]:  No data recorded Time Telepsych consult ordered in [facility]:   No data recorded  Patient Reported Information Reviewed? Yes  Patient Left Without Being Seen? No data recorded Reason for Not Completing Assessment: No data recorded  Collateral Involvement: TTS was able to contact patient's father for collateral information as documented in narrative   Does Patient Have a [place]? No data recorded Name and Contact of Legal Guardian: No data recorded If Minor and Not Living with Parent(s), Who has Custody? No data recorded Is CPS involved or ever been involved? Never  Is APS involved or ever been involved? Never   Patient Determined To Be At Risk for Harm To Self or Others Based on Review of Patient Reported Information or Presenting Complaint? No  Method: No data recorded Availability of Means: No data recorded Intent: No data recorded Notification Required: No data recorded Additional Information for Danger to Others Potential: No data recorded Additional Comments for Danger to Others Potential: No data recorded Are There Guns or Other Weapons in Your Home? No data recorded Types of Guns/Weapons: No data recorded Are These Weapons Safely Secured?                            No data recorded Who Could Verify You Are Able To Have These Secured: No data recorded Do You Have any Outstanding Charges, Pending Court Dates, Parole/Probation? No data recorded Contacted To Inform of Risk of Harm To Self or Others: No data recorded  Location of Assessment: [facility]   Does Patient Present under Involuntary Commitment? No  IVC Papers Initial File Date: No data recorded  [place] of Residence: [place]   Patient Currently Receiving the Following Services: Not Receiving Services   Determination of Need: Emergent (2 hours)   Options For Referral: Other: Comment (Continuous Observation)     CCA Biopsychosocial  Intake/Chief Complaint:  Patient was brought to the [facility] by the police department for  psychiatric evaluation after his family had him picked up because of his bizarre behavior, not communicating, not taking care of his ADLs.  Patient has been diagnosed with schizophrenia and has been hospitalized at [facility] on several occasions.  He has been off his medications for the past three months, smoking marijuana and decompensating. Patient is denying SI/HI/Psychosis, but is not currently a good historian and is not willing to communicate much, but will answer questions with a great deal of prompting.  Patient Reported Schizophrenia/Schizoaffective Diagnosis in Past: Yes   Mental Health Symptoms Depression:  Increase/decrease in appetite;Sleep (too much or little)   Duration of Depressive symptoms: Greater than two weeks   Mania:  None   Anxiety:   Difficulty concentrating;Sleep   Psychosis:  Grossly disorganized or catatonic behavior   Duration of Psychotic symptoms: Less than six months   Trauma:  None   Obsessions:  None   Compulsions:  None   Inattention:  None   Hyperactivity/Impulsivity:  N/A   Oppositional/Defiant Behaviors:  None   Emotional Irregularity:  None   Other Mood/Personality Symptoms:  No data recorded   Mental Status Exam Appearance and self-care  Stature:  Small   Weight:  Thin  Clothing:  Disheveled;Dirty   Grooming:  Neglected   Cosmetic use:  None   Posture/gait:  Normal   Motor activity:  Slowed   Sensorium  Attention:  Confused   Concentration:  Variable   Orientation:  Object;Person;Place;Time;Situation   Recall/memory:  Normal   Affect and Mood  Affect:  Blunted;Flat   Mood:  Depressed   Relating  Eye contact:  Avoided   Facial expression:  Depressed   Attitude toward examiner:  Guarded   Thought and Language  Speech flow: Garbled;Slow   Thought content:  Appropriate to Mood and Circumstances   Preoccupation:  None   Hallucinations:  No data recorded  Organization:  No data recorded  [employer] of Knowledge:  Average   Intelligence:  Average   Abstraction:  Normal   Judgement:  Fair   Art therapist:  Realistic   Insight:  Lacking   Decision Making:  Impulsive   Social Functioning  Social Maturity:  Isolates   Social Judgement:  "Games developer"   Stress  Stressors:  Teacher, music Ability:  Deficient supports   Skill Deficits:  Self-care;Decision making   Supports:  Family      Religion: Religion/Spirituality Are You A Religious Person?:  (unable to assess) How Might This Affect Treatment?: unable to assess  Leisure/Recreation: Leisure / Recreation Do You Have Hobbies?: Yes Leisure and Hobbies: Hanging with friends  Exercise/Diet: Exercise/Diet Do You Exercise?: Yes What Type of Exercise Do You Do?: Run/Walk (walks to wherever he goes) How Many Times a Week Do You Exercise?: 6-7 times a week Have You Gained or Lost A Significant Amount of Weight in the Past Six Months?:  (unable to assess) Do You Follow a Special Diet?: No Do You Have Any Trouble Sleeping?:  (unable to assess)   CCA Employment/Education  Employment/Work Situation: Employment / Work Situation Employment situation: Unemployed Patient's job has been impacted by current illness: No What is the longest time patient has a held a job?: N/A Where was the patient employed at that time?: N/A Has patient ever been in the [employer]?: No  Education: Education Is Patient Currently Attending School?: No Last Grade Completed:  (unable to assess) Did Teacher, adult education From [employer]?: No (unable to assess) Did [place]?: No Did You Attend Graduate School?: No Did You Have An Individualized Education Program (IIEP): No Did You Have Any Difficulty At School?: No Patient's Education Has Been Impacted by Current Illness: No   CCA Family/Childhood History  Family and Relationship History: Family history Marital status: Single Are you sexually active?:  (unable to  assess) What is your sexual orientation?: straight Has your sexual activity been affected by drugs, alcohol, medication, or emotional stress?: unable to assess Does patient have children?: No  Childhood History:  Childhood History By whom was/is the patient raised?: Mother Description of patient's relationship with caregiver when they were a child: Patient was closest to his mother growing up, but later went to live with his father Patient's description of current relationship with people who raised him/her: Patient is relatively close to both parents How were you disciplined when you got in trouble as a child/adolescent?: unable to assess Does patient have siblings?: Yes Number of Siblings: 4 Description of patient's current relationship with siblings: 2 half, 2 full Did patient suffer any verbal/emotional/physical/sexual abuse as a child?: No Did patient suffer from severe childhood neglect?: No Has patient ever been sexually abused/assaulted/raped as an adolescent or adult?: No Witnessed  domestic violence?: No Has patient been affected by domestic violence as an adult?: No  Child/Adolescent Assessment:     CCA Substance Use  Alcohol/Drug Use: Alcohol / Drug Use Pain Medications: See MAR Prescriptions: See MAR Over the Counter: See MAR History of alcohol / drug use?: Yes Longest period of sobriety (when/how long): unable to assess Substance #1 Name of Substance 1: marijuana 1 - Age of First Use: unable to assess 1 - Amount (size/oz): unable to assess 1 - Frequency: family reports daily use 1 - Duration: unable to assess 1 - Last Use / Amount: unable to assess                       ASAM's:  Six Dimensions of Multidimensional Assessment  Dimension 1:  Acute Intoxication and/or Withdrawal Potential:   Dimension 1:  Description of individual's past and current experiences of substance use and withdrawal: Patient does not report experiencing any complications with  withdrawal symptoms  Dimension 2:  Biomedical Conditions and Complications:   Dimension 2:  Description of patient's biomedical conditions and  complications: Patient does not report any medical complications that are exacerbated by his marijuana use  Dimension 3:  Emotional, Behavioral, or Cognitive Conditions and Complications:  Dimension 3:  Description of emotional, behavioral, or cognitive conditions and complications: Patient is often non-compliant with taking his mental health medications and uses  Dimension 4:  Readiness to Change:  Dimension 4:  Description of Readiness to Change criteria: Patient has a history of non-compliance with treatment and has shown resistance to change especially concerning his drug use.  He does not seem to understand to relationship between his drug use and complications with his mental illness  Dimension 5:  Relapse, Continued use, or Continued Problem Potential:  Dimension 5:  Relapse, continued use, or continued problem potential critiera description: Patient has no significant period of abstinence and  Dimension 6:  Recovery/Living Environment:  Dimension 6:  Recovery/Iiving environment criteria description: Patient lives with his brother in a safe and supportive environment  ASAM Severity Score: ASAM's Severity Rating Score: 10  ASAM Recommended Level of Treatment: ASAM Recommended Level of Treatment: Level II Intensive Outpatient Treatment   Substance use Disorder (SUD) Substance Use Disorder (SUD)  Checklist Symptoms of Substance Use: Continued use despite having a persistent/recurrent physical/psychological problem caused/exacerbated by use, Continued use despite persistent or recurrent social, interpersonal problems, caused or exacerbated by use, Social, occupational, recreational activities given up or reduced due to use, Large amounts of time spent to obtain, use or recover from the substance(s)  Recommendations for  Services/Supports/Treatments: Recommendations for Services/Supports/Treatments Recommendations For Services/Supports/Treatments: SAIOP (Substance Abuse Intensive Outpatient Program)  DSM5 Diagnoses: Patient Active Problem List   Diagnosis Date Noted  . Cocaine abuse ([place]) 02/19/2018  . Cocaine abuse with cocaine-induced mood disorder ([place]) 02/19/2018  . Psychosis ([place]) 11/27/2017  . Cannabis abuse with psychotic disorder, with delusions ([place]) 11/26/2017  . Schizophrenia, unspecified ([place]) 11/26/2017   Disposition:  Per Dr Serafina Mitchell, patient is recommended for continuous observation and to re-start medications   Referrals to Alternative Service(s): Referred to Alternative Service(s):   Place:   Date:   Time:    Referred to Alternative Service(s):   Place:   Date:   Time:    Referred to Alternative Service(s):   Place:   Date:   Time:    Referred to Alternative Service(s):   Place:   Date:   Time:     Judeth Porch  Javante Nilsson, LCAS

## 2020-11-02 NOTE — ED Notes (Signed)
Patient belongings in locker 25

## 2020-11-02 NOTE — ED Notes (Signed)
Patient had no belongings to lock up.

## 2020-11-02 NOTE — Progress Notes (Signed)
Received Tyler White this PM to perform his admission tests and assessment. He was cooperative with the lab tests but unable to answer the assessment questions. His words was mostly in such a low tone this writer asked him to shake his head for yes and no questions. He was cooperative with the skin assessment and was admitted to the observation area without incident. He is currently sleeping in his chair bed.

## 2020-11-03 MED ORDER — ARIPIPRAZOLE 10 MG PO TABS
10.0000 mg | ORAL_TABLET | Freq: Every day | ORAL | 0 refills | Status: DC
Start: 2020-11-04 — End: 2020-11-29

## 2020-11-03 NOTE — ED Notes (Signed)
Patient given breakfast; muffin, juice

## 2020-11-03 NOTE — ED Notes (Signed)
Patient A&O x 4, ambulatory. Patient discharged in no acute distress. Patient denied SI/HI, A/VH upon discharge. Patient verbalized understanding of all discharge instructions explained by staff, to include follow up appointments, RX's and safety plan. Patient reported mood 10/10.  Pt belongings returned to patient from locker #25 intact. Patient escorted to lobby via staff for transport to destination. Safety maintained.

## 2020-11-03 NOTE — ED Notes (Signed)
Pt awake eating a sandwich @this  time. Pt has no c/o of pain or distress. Will continue to monitor pt for safety.

## 2020-11-03 NOTE — ED Notes (Signed)
Pt laying down watching tv. Denies concerns. Denies SI/HI with head gesture. Limited verbal communication initiated during assessment. Informed pt to notify staff with any needs or concerns. Safety maintained.

## 2020-11-03 NOTE — ED Notes (Signed)
Pt restless@this  time, but continue to have no c/o of pain or distress. Will continue to monitor for safety

## 2020-11-03 NOTE — ED Notes (Signed)
Pt resting with eyes closed. No acute distress noted. Safety maintained.
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
## 2020-11-03 NOTE — Discharge Instructions (Signed)
Please come to [facility] (this facility) during walk in hours for appointment with psychiatrist for further medication management. Please come for follow up appointment before your 7 day sample runs out so that you will not be without medication.  Walk in hours are 8-11 AM Monday through Thursday. There is often a wait, and it is best to arrive by 7:30 AM.   Address:  [address], in [place], [place] Ph: [phone]

## 2020-11-18 ENCOUNTER — Emergency Department (HOSPITAL_COMMUNITY)
Admission: EM | Admit: 2020-11-18 | Discharge: 2020-11-19 | Disposition: A | Discharge status: Home / Self Care | Payer: Medicaid Other | Attending: Emergency Medicine | Admitting: Emergency Medicine

## 2020-11-18 ENCOUNTER — Encounter (HOSPITAL_COMMUNITY): Payer: Self-pay | Admitting: Emergency Medicine

## 2020-11-18 DIAGNOSIS — F17210 Nicotine dependence, cigarettes, uncomplicated: Secondary | ICD-10-CM | POA: Diagnosis not present

## 2020-11-18 DIAGNOSIS — R45851 Suicidal ideations: Secondary | ICD-10-CM

## 2020-11-18 DIAGNOSIS — Z20822 Contact with and (suspected) exposure to covid-19: Secondary | ICD-10-CM | POA: Diagnosis not present

## 2020-11-18 DIAGNOSIS — Z79899 Other long term (current) drug therapy: Secondary | ICD-10-CM | POA: Insufficient documentation

## 2020-11-18 DIAGNOSIS — R440 Auditory hallucinations: Secondary | ICD-10-CM | POA: Insufficient documentation

## 2020-11-18 DIAGNOSIS — R441 Visual hallucinations: Secondary | ICD-10-CM | POA: Diagnosis not present

## 2020-11-18 DIAGNOSIS — R443 Hallucinations, unspecified: Secondary | ICD-10-CM

## 2020-11-18 HISTORY — DX: Schizophrenia, unspecified: F20.9

## 2020-11-18 LAB — RESP PANEL BY RT-PCR (FLU A&B, COVID) ARPGX2
Influenza A by PCR: NEGATIVE
Influenza B by PCR: NEGATIVE
SARS Coronavirus 2 by RT PCR: NEGATIVE

## 2020-11-18 LAB — COMPREHENSIVE METABOLIC PANEL
ALT: 19 U/L (ref 0–44)
AST: 28 U/L (ref 15–41)
Albumin: 4.5 g/dL (ref 3.5–5.0)
Alkaline Phosphatase: 55 U/L (ref 38–126)
Anion gap: 12 (ref 5–15)
BUN: 10 mg/dL (ref 6–20)
CO2: 19 mmol/L — ABNORMAL LOW (ref 22–32)
Calcium: 9.2 mg/dL (ref 8.9–10.3)
Chloride: 104 mmol/L (ref 98–111)
Creatinine, Ser: 0.63 mg/dL (ref 0.61–1.24)
GFR, Estimated: >60 mL/min (ref >60)
Glucose, Bld: 77 mg/dL (ref 70–99)
Potassium: 3.4 mmol/L — ABNORMAL LOW (ref 3.5–5.1)
Sodium: 135 mmol/L (ref 135–145)
Total Bilirubin: 0.5 mg/dL (ref 0.3–1.2)
Total Protein: 7.7 g/dL (ref 6.5–8.1)

## 2020-11-18 LAB — ETHANOL: Alcohol, Ethyl (B): 23 mg/dL — ABNORMAL HIGH (ref <10)

## 2020-11-18 LAB — CBC
HCT: 42.5 % (ref 39.0–52.0)
Hemoglobin: 14 g/dL (ref 13.0–17.0)
MCH: 28.6 pg (ref 26.0–34.0)
MCHC: 32.9 g/dL (ref 30.0–36.0)
MCV: 86.7 fL (ref 80.0–100.0)
Platelets: 342 10*3/uL (ref 150–400)
RBC: 4.9 MIL/uL (ref 4.22–5.81)
RDW: 14.3 % (ref 11.5–15.5)
WBC: 9.6 10*3/uL (ref 4.0–10.5)
nRBC: 0 % (ref 0.0–0.2)

## 2020-11-18 LAB — RAPID URINE DRUG SCREEN, HOSP PERFORMED
Amphetamines: NOT DETECTED
Barbiturates: NOT DETECTED
Benzodiazepines: NOT DETECTED
Cocaine: POSITIVE — AB
Opiates: NOT DETECTED
Tetrahydrocannabinol: NOT DETECTED

## 2020-11-18 LAB — SALICYLATE LEVEL: Salicylate Lvl: <7 mg/dL — ABNORMAL LOW (ref 7.0–30.0)

## 2020-11-18 LAB — ACETAMINOPHEN LEVEL: Acetaminophen (Tylenol), Serum: <10 ug/mL — ABNORMAL LOW (ref 10–30)

## 2020-11-18 MED ORDER — LORAZEPAM 2 MG/ML IJ SOLN: 0.0000 mg | Freq: Four times a day (QID) | INTRAMUSCULAR | Status: DC

## 2020-11-18 MED ORDER — LORAZEPAM 1 MG PO TABS: 0.0000 mg | ORAL_TABLET | Freq: Two times a day (BID) | ORAL | Status: DC

## 2020-11-18 MED ORDER — THIAMINE HCL 100 MG/ML IJ SOLN: 100.0000 mg | Freq: Every day | INTRAMUSCULAR | Status: DC

## 2020-11-18 MED ORDER — THIAMINE HCL 100 MG PO TABS
100.0000 mg | ORAL_TABLET | Freq: Every day | ORAL | Status: DC
  Administered 2020-11-18: 100 mg via ORAL
  Filled 2020-11-18: qty 1

## 2020-11-18 MED ORDER — LORAZEPAM 2 MG/ML IJ SOLN: 0.0000 mg | Freq: Two times a day (BID) | INTRAMUSCULAR | Status: DC

## 2020-11-18 MED ORDER — LORAZEPAM 1 MG PO TABS: 0.0000 mg | ORAL_TABLET | Freq: Four times a day (QID) | ORAL | Status: DC

## 2020-11-18 NOTE — ED Notes (Signed)
Sitter at bedside.

## 2020-11-18 NOTE — ED Triage Notes (Addendum)
Pt presents suicidal ideation from homeless shelter via [facility]. Admits to plan but wont share what it is. Alert and oriented. Schizophrenia but not on meds x 2 weeks.

## 2020-11-18 NOTE — ED Notes (Signed)
Patient changed into scrubs. One labeled patient belongings bag secured at triage patient belongings cabinet.
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
## 2020-11-18 NOTE — ED Provider Notes (Signed)
[facility] DEPT Provider Note   CSN: [id number] Arrival date & time: 11/18/20  1032     History Chief Complaint  Patient presents with  . Suicidal    Tyler White is a 24 y.o. male history of schizophrenia, polysubstance abuse.  Patient presents today via EMS from shelter for concern of suicidal ideations.  Patient reports plan to take his own life by overdosing on some pills that he found.  He denies any ingestions or injuries today.  He reports both visual and auditory hallucinations and increasing life stressors but does not want to talk about this with me on evaluation.  He reports that he physically feels well and has no complaints but he would like psychiatric evaluation for his hallucinations and suicidal ideations.  Denies fever/chills, fall/injury, ingestions, abdominal pain, nausea/vomiting, recent illness, homicidal ideations or any additional concerns.  HPI     Past Medical History:  Diagnosis Date  . Hernia, inguinal, right   . Inguinal hernia    right  . Psychiatric illness   . Schizophrenia [facility])     Patient Active Problem List   Diagnosis Date Noted  . Cocaine abuse ([place]) 02/19/2018  . Cocaine abuse with cocaine-induced mood disorder ([place]) 02/19/2018  . Psychosis ([place]) 11/27/2017  . Cannabis abuse with psychotic disorder, with delusions ([place]) 11/26/2017  . Schizophrenia, unspecified ([place]) 11/26/2017    No past surgical history on file.     Family History  Problem Relation Age of Onset  . Psychiatric Illness Mother   . Hypertension Sister     Social History   Tobacco Use  . Smoking status: Current Every Day Smoker    Packs/day: 0.50    Years: 5.00    Pack years: 2.50    Types: Cigarettes  . Smokeless tobacco: Never Used  Vaping Use  . Vaping Use: Never used  Substance Use Topics  . Alcohol use: Yes  . Drug use: Yes    Types: Marijuana    Home Medications Prior to Admission medications     Medication Sig Start Date End Date Taking? Authorizing Provider  ARIPiprazole (ABILIFY) 10 MG tablet Take 1 tablet (10 mg total) by mouth daily for 7 days. 11/04/20 11/11/20  Ival Bible, MD    Allergies    Patient has no known allergies.  Review of Systems   Review of Systems Ten systems are reviewed and are negative for acute change except as noted in the HPI  Physical Exam Updated Vital Signs BP (!) 138/92 (BP Location: Right Arm) Comment: Simultaneous filing. User may not have seen previous data.  Pulse 69 Comment: Simultaneous filing. User may not have seen previous data.  Temp 98.2 F (36.8 C) (Oral)   Resp 17 Comment: Simultaneous filing. User may not have seen previous data.  SpO2 99% Comment: Simultaneous filing. User may not have seen previous data.  Physical Exam Constitutional:      General: He is not in acute distress.    Appearance: Normal appearance. He is well-developed. He is not ill-appearing or diaphoretic.  HENT:     Head: Normocephalic and atraumatic.  Eyes:     General: Vision grossly intact. Gaze aligned appropriately.     Pupils: Pupils are equal, round, and reactive to light.  Neck:     Trachea: Trachea and phonation normal.  Pulmonary:     Effort: Pulmonary effort is normal. No respiratory distress.  Abdominal:     General: There is no distension.  Palpations: Abdomen is soft.     Tenderness: There is no abdominal tenderness. There is no guarding or rebound.  Musculoskeletal:        General: Normal range of motion.     Cervical back: Normal range of motion.  Skin:    General: Skin is warm and dry.  Neurological:     Mental Status: He is alert.     GCS: GCS eye subscore is 4. GCS verbal subscore is 5. GCS motor subscore is 6.     Comments: Speech is clear and goal oriented, follows commands Major Cranial nerves without deficit, no facial droop Moves extremities without ataxia, coordination intact  Psychiatric:        Attention and  Perception: He perceives auditory and visual hallucinations.        Mood and Affect: Affect is flat.        Behavior: Behavior is withdrawn. Behavior is cooperative.        Thought Content: Thought content includes suicidal ideation. Thought content does not include homicidal ideation. Thought content includes suicidal plan.     ED Results / Procedures / Treatments   Labs (all labs ordered are listed, but only abnormal results are displayed) Labs Reviewed  COMPREHENSIVE METABOLIC PANEL - Abnormal; Notable for the following components:      Result Value   Potassium 3.4 (*)    CO2 19 (*)    All other components within normal limits  ETHANOL - Abnormal; Notable for the following components:   Alcohol, Ethyl (B) 23 (*)    All other components within normal limits  SALICYLATE LEVEL - Abnormal; Notable for the following components:   Salicylate Lvl <0.1 (*)    All other components within normal limits  ACETAMINOPHEN LEVEL - Abnormal; Notable for the following components:   Acetaminophen (Tylenol), Serum <10 (*)    All other components within normal limits  RAPID URINE DRUG SCREEN, HOSP PERFORMED - Abnormal; Notable for the following components:   Cocaine POSITIVE (*)    All other components within normal limits  RESP PANEL BY RT-PCR (FLU A&B, COVID) ARPGX2  CBC    EKG None  Radiology No results found.  Procedures Procedures (including critical care time)  Medications Ordered in ED Medications  LORazepam (ATIVAN) injection 0-4 mg (has no administration in time range)    Or  LORazepam (ATIVAN) tablet 0-4 mg (has no administration in time range)  LORazepam (ATIVAN) injection 0-4 mg (has no administration in time range)    Or  LORazepam (ATIVAN) tablet 0-4 mg (has no administration in time range)  thiamine tablet 100 mg (has no administration in time range)    Or  thiamine (B-1) injection 100 mg (has no administration in time range)    ED Course  I have reviewed the  triage vital signs and the nursing notes.  Pertinent labs & imaging results that were available during my care of the patient were reviewed by me and considered in my medical decision making (see chart for details).    MDM Rules/Calculators/A&P                         Additional history obtained from: 1. Nursing notes from this visit. 2. Review of electronic medical records. ------------------- 24 year old male with history of schizophrenia presented for suicidal ideations with plan to overdose.  He denies any toxic ingestions or injuries.  He reports that he is feeling well physically at this time denies any  recent illnesses.  He does endorse auditory and visual hallucinations but would not share with me what they are.  He is requesting psychiatric evaluation.  He presented from the shelter today.  Will obtain medical clearance labs and consult TTS. - I ordered, reviewed and interpreted labs which include: Tylenol and salicylate levels negative, no evidence of toxic ingestion of times substances. UDS positive for cocaine. Ethanol minimally elevated at 23, patient does not appear intoxicated or in withdrawal. CMP shows no emergent electrolyte derangement, AKI, LFT elevations or gap.  Mild hypokalemia of 3.4, should correct with normal diet, bicarb 19, patient with normal glucose no history of diabetes normal gap no evidence for DKA. CBC without leukocytosis to suggest infection, no anemia. - At this time there does not appear to be any evidence of an acute emergency medical condition and the patient appears stable for psychiatric evaluation.  Patient placed in psych hold with CIWA protocol.  Note: Portions of this report may have been transcribed using voice recognition software. Every effort was made to ensure accuracy; however, inadvertent computerized transcription errors may still be present. Final Clinical Impression(s) / ED Diagnoses Final diagnoses:  Suicidal ideation  Hallucinations     Rx / DC Orders ED Discharge Orders    None       Deliah Boston, PA-C 11/18/20 [place], [place], DO 11/18/20 1430

## 2020-11-19 ENCOUNTER — Other Ambulatory Visit: Payer: Self-pay

## 2020-11-19 ENCOUNTER — Inpatient Hospital Stay (HOSPITAL_COMMUNITY)
Admission: AD | Admit: 2020-11-19 | Discharge: 2020-11-29 | DRG: 885 | Disposition: A | Discharge status: Home / Self Care | Payer: Medicaid Other | Source: Intra-hospital | Attending: Psychiatry | Admitting: Psychiatry

## 2020-11-19 ENCOUNTER — Encounter (HOSPITAL_COMMUNITY): Payer: Self-pay | Admitting: Psychiatry

## 2020-11-19 DIAGNOSIS — F209 Schizophrenia, unspecified: Principal | ICD-10-CM | POA: Diagnosis present

## 2020-11-19 DIAGNOSIS — Z9119 Patient's noncompliance with other medical treatment and regimen: Secondary | ICD-10-CM

## 2020-11-19 DIAGNOSIS — Z5901 Sheltered homelessness: Secondary | ICD-10-CM

## 2020-11-19 DIAGNOSIS — Z91411 Personal history of adult psychological abuse: Secondary | ICD-10-CM | POA: Diagnosis not present

## 2020-11-19 DIAGNOSIS — R45851 Suicidal ideations: Secondary | ICD-10-CM | POA: Diagnosis present

## 2020-11-19 DIAGNOSIS — Z8249 Family history of ischemic heart disease and other diseases of the circulatory system: Secondary | ICD-10-CM

## 2020-11-19 DIAGNOSIS — F1414 Cocaine abuse with cocaine-induced mood disorder: Secondary | ICD-10-CM | POA: Diagnosis present

## 2020-11-19 DIAGNOSIS — F12150 Cannabis abuse with psychotic disorder with delusions: Secondary | ICD-10-CM | POA: Diagnosis present

## 2020-11-19 DIAGNOSIS — F1410 Cocaine abuse, uncomplicated: Secondary | ICD-10-CM | POA: Diagnosis not present

## 2020-11-19 DIAGNOSIS — F29 Unspecified psychosis not due to a substance or known physiological condition: Secondary | ICD-10-CM | POA: Diagnosis present

## 2020-11-19 DIAGNOSIS — F200 Paranoid schizophrenia: Secondary | ICD-10-CM | POA: Diagnosis not present

## 2020-11-19 DIAGNOSIS — Z818 Family history of other mental and behavioral disorders: Secondary | ICD-10-CM | POA: Diagnosis not present

## 2020-11-19 DIAGNOSIS — F32A Depression, unspecified: Secondary | ICD-10-CM | POA: Diagnosis present

## 2020-11-19 DIAGNOSIS — Z91410 Personal history of adult physical and sexual abuse: Secondary | ICD-10-CM | POA: Diagnosis not present

## 2020-11-19 DIAGNOSIS — F17210 Nicotine dependence, cigarettes, uncomplicated: Secondary | ICD-10-CM | POA: Diagnosis present

## 2020-11-19 DIAGNOSIS — G4700 Insomnia, unspecified: Secondary | ICD-10-CM | POA: Diagnosis present

## 2020-11-19 DIAGNOSIS — F419 Anxiety disorder, unspecified: Secondary | ICD-10-CM | POA: Diagnosis present

## 2020-11-19 DIAGNOSIS — F12159 Cannabis abuse with psychotic disorder, unspecified: Secondary | ICD-10-CM | POA: Diagnosis present

## 2020-11-19 DIAGNOSIS — F203 Undifferentiated schizophrenia: Secondary | ICD-10-CM | POA: Diagnosis not present

## 2020-11-19 MED ORDER — HYDROXYZINE HCL 25 MG PO TABS
25.0000 mg | ORAL_TABLET | Freq: Three times a day (TID) | ORAL | Status: DC | PRN
  Administered 2020-11-20 – 2020-11-28 (×9): 25 mg via ORAL
  Filled 2020-11-19 (×11): qty 1

## 2020-11-19 MED ORDER — HALOPERIDOL LACTATE 5 MG/ML IJ SOLN: 5.0000 mg | Freq: Three times a day (TID) | INTRAMUSCULAR | Status: DC | PRN

## 2020-11-19 MED ORDER — MAGNESIUM HYDROXIDE 400 MG/5ML PO SUSP: 30.0000 mL | Freq: Every day | ORAL | Status: DC | PRN

## 2020-11-19 MED ORDER — THIAMINE HCL 100 MG/ML IJ SOLN
100.0000 mg | Freq: Every day | INTRAMUSCULAR | Status: DC
  Administered 2020-11-27: 100 mg via INTRAVENOUS
  Filled 2020-11-19: qty 2

## 2020-11-19 MED ORDER — GABAPENTIN 300 MG PO CAPS: 300.0000 mg | ORAL_CAPSULE | Freq: Three times a day (TID) | ORAL | Status: DC

## 2020-11-19 MED ORDER — FLUOXETINE HCL 20 MG PO CAPS
20.0000 mg | ORAL_CAPSULE | Freq: Every day | ORAL | Status: DC
  Administered 2020-11-19 – 2020-11-20 (×2): 20 mg via ORAL
  Filled 2020-11-19 (×4): qty 1

## 2020-11-19 MED ORDER — GABAPENTIN 300 MG PO CAPS
300.0000 mg | ORAL_CAPSULE | Freq: Three times a day (TID) | ORAL | Status: DC
  Administered 2020-11-19 – 2020-11-24 (×14): 300 mg via ORAL
  Filled 2020-11-19 (×21): qty 1

## 2020-11-19 MED ORDER — ARIPIPRAZOLE 10 MG PO TABS
10.0000 mg | ORAL_TABLET | Freq: Every day | ORAL | Status: DC
  Administered 2020-11-19 – 2020-11-20 (×2): 10 mg via ORAL
  Filled 2020-11-19 (×4): qty 1

## 2020-11-19 MED ORDER — OLANZAPINE 5 MG PO TABS: 5.0000 mg | ORAL_TABLET | Freq: Every day | ORAL | Status: DC

## 2020-11-19 MED ORDER — THIAMINE HCL 100 MG PO TABS
100.0000 mg | ORAL_TABLET | Freq: Every day | ORAL | Status: DC
  Administered 2020-11-20 – 2020-11-29 (×9): 100 mg via ORAL
  Filled 2020-11-19 (×13): qty 1

## 2020-11-19 MED ORDER — DIPHENHYDRAMINE HCL 50 MG/ML IJ SOLN
25.0000 mg | Freq: Three times a day (TID) | INTRAMUSCULAR | Status: DC | PRN
  Administered 2020-11-27 – 2020-11-28 (×2): 25 mg via INTRAMUSCULAR
  Filled 2020-11-19 (×2): qty 1

## 2020-11-19 MED ORDER — FLUOXETINE HCL 20 MG PO CAPS: 20.0000 mg | ORAL_CAPSULE | Freq: Every day | ORAL | Status: DC

## 2020-11-19 MED ORDER — DIPHENHYDRAMINE HCL 50 MG/ML IJ SOLN: 25.0000 mg | Freq: Three times a day (TID) | INTRAMUSCULAR | Status: DC | PRN

## 2020-11-19 MED ORDER — LORAZEPAM 2 MG/ML IJ SOLN: 1.0000 mg | Freq: Three times a day (TID) | INTRAMUSCULAR | Status: DC | PRN

## 2020-11-19 MED ORDER — HALOPERIDOL LACTATE 5 MG/ML IJ SOLN
5.0000 mg | Freq: Three times a day (TID) | INTRAMUSCULAR | Status: DC | PRN
  Administered 2020-11-27 – 2020-11-28 (×2): 5 mg via INTRAMUSCULAR
  Filled 2020-11-19 (×2): qty 1

## 2020-11-19 MED ORDER — ALUM & MAG HYDROXIDE-SIMETH 200-200-20 MG/5ML PO SUSP: 30.0000 mL | ORAL | Status: DC | PRN

## 2020-11-19 MED ORDER — LORAZEPAM 2 MG/ML IJ SOLN
1.0000 mg | Freq: Three times a day (TID) | INTRAMUSCULAR | Status: DC | PRN
  Administered 2020-11-27 – 2020-11-28 (×2): 1 mg via INTRAMUSCULAR
  Filled 2020-11-19 (×2): qty 1

## 2020-11-19 NOTE — Progress Notes (Signed)
Patient has been accepted to Room 406-02 at [facility] to the service of MD [place].  Report may be called to [phone] and may come ASAP.
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
## 2020-11-19 NOTE — Care Management (Signed)
Writer spoke to [employer] Nurse to assist with transportation for the patient to [facility]

## 2020-11-19 NOTE — Tx Team (Addendum)
Initial Treatment Plan 11/19/2020 6:31 PM [name] RQS:[id number]    PATIENT STRESSORS: Financial difficulties Substance abuse   PATIENT STRENGTHS: Average or above average intelligence Motivation for treatment/growth   PATIENT IDENTIFIED PROBLEMS: Homelessness  Non-compliant with outpatient treatment and psych meds  Depression                 DISCHARGE CRITERIA:  Improved stabilization in mood, thinking, and/or behavior Withdrawal symptoms are absent or subacute and managed without 24-hour nursing intervention  PRELIMINARY DISCHARGE PLAN: Attend aftercare/continuing care group Attend 12-step recovery group Placement in alternative living arrangements  PATIENT/FAMILY INVOLVEMENT: This treatment plan has been presented to and reviewed with the patient, Tyler White.  The patient and family have been given the opportunity to ask questions and make suggestions.  Ann Held, RN 11/19/2020, 6:31 PM

## 2020-11-19 NOTE — ED Notes (Signed)
Pt moved to room 9 at this time.  Pt ambulatory.  NADN.  Will continue to monitor.

## 2020-11-19 NOTE — Care Management (Signed)
Writer informed the ER MD of the patient disposition.

## 2020-11-19 NOTE — Progress Notes (Signed)
   11/19/20 2357  Psych Admission Type (Psych Patients Only)  Admission Status Voluntary  Psychosocial Assessment  Patient Complaints None  Eye Contact Brief  Facial Expression Flat  Affect Blunted  Speech Soft  Interaction Avoidant;Guarded  Motor Activity Fidgety;Restless  Appearance/Hygiene Poor hygiene;In scrubs  Behavior Characteristics Guarded  Mood Depressed  Thought Process  Coherency Blocking  Content Preoccupation  Delusions None reported or observed  Perception UTA  Hallucination Auditory  Judgment Impaired  Confusion UTA  Danger to Self  Current suicidal ideation? Denies  Danger to Others  Danger to Others None reported or observed

## 2020-11-19 NOTE — Progress Notes (Signed)
   11/19/20 2345  COVID-19 Daily Checkoff  Have you had a fever (temp > 37.80C/100F)  in the past 24 hours?  No  If you have had runny nose, nasal congestion, sneezing in the past 24 hours, has it worsened? No  COVID-19 EXPOSURE  Have you traveled outside the state in the past 14 days? No  Have you been in contact with someone with a confirmed diagnosis of COVID-19 or PUI in the past 14 days without wearing appropriate PPE? No  Have you been living in the same home as a person with confirmed diagnosis of COVID-19 or a PUI (household contact)? No  Have you been diagnosed with COVID-19? No

## 2020-11-19 NOTE — Progress Notes (Signed)
Spoke with PA and received order for no roommate due to report by former roommate that patient had hand in pants pulling out pubic hair and placing it on sink.  Pt moved to room 401 from room 407 and order received for no roommate.

## 2020-11-20 DIAGNOSIS — F203 Undifferentiated schizophrenia: Secondary | ICD-10-CM

## 2020-11-20 MED ORDER — OLANZAPINE 5 MG PO TABS
5.0000 mg | ORAL_TABLET | Freq: Two times a day (BID) | ORAL | Status: DC
  Administered 2020-11-20 – 2020-11-22 (×5): 5 mg via ORAL
  Filled 2020-11-20 (×8): qty 1

## 2020-11-20 MED ORDER — FLUOXETINE HCL 10 MG PO CAPS: 10.0000 mg | ORAL_CAPSULE | Freq: Every day | ORAL | Status: DC

## 2020-11-20 NOTE — Progress Notes (Signed)
Recreation Therapy Notes  Date:  1.22.20 Time: 0930 Location: 300 Hall Dayroom  Group Topic: Stress Management  Goal Area(s) Addresses:  Patient will identify positive stress management techniques. Patient will identify benefits of using stress management post d/c.  Behavioral Response: Engaged  Intervention: Stress Management  Activity :  Meditation.  LRT played a meditation that guided patients through a body scan to take inventory of any sensations they may have been feeling at the time.  Patients were to listen and follow along as meditation played to fully engage.  Education:  Stress Management, Discharge Planning.   Education Outcome: Acknowledges Education  Clinical Observations/Feedback: Pt attended and participated in group session.      Victorino Sparrow, LRT/CTRS         Victorino Sparrow A 11/20/2020 12:19 PM

## 2020-11-20 NOTE — Progress Notes (Signed)
   11/20/20 1500  Psych Admission Type (Psych Patients Only)  Admission Status Voluntary  Psychosocial Assessment  Patient Complaints None  Eye Contact Brief  Facial Expression Flat  Affect Blunted  Speech Soft  Interaction Avoidant;Guarded  Motor Activity Fidgety;Restless  Appearance/Hygiene Poor hygiene;In scrubs  Behavior Characteristics Guarded  Mood Depressed  Thought Process  Coherency Blocking  Content Preoccupation  Delusions None reported or observed  Perception UTA  Hallucination Auditory  Judgment Impaired  Confusion UTA  Danger to Self  Current suicidal ideation? Denies  Danger to Others  Danger to Others None reported or observed  Dar Note: Patient presents with a flat affect and mood.  Denies suicidal thoughts, auditory and visual hallucinations.  Medication given as prescribed.  Routine safety checks maintained every 15 minutes.  Patient is withdrawn and isolates to his room.  Minimal interaction with staff and peers.  Patient is safe on and off the unit.
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
## 2020-11-20 NOTE — Progress Notes (Signed)
Psychoeducational Group Note  Date:  11/20/2020 Time:  1842  Group Topic/Focus:  Developing a Wellness Toolbox:   The focus of this group is to help patients develop a "wellness toolbox" with skills and strategies to promote recovery upon discharge.  Participation Level: Did Not Attend  Participation Quality:  Not Applicable  Affect:  Not Applicable  Cognitive:  Not Applicable  Insight:  Not Applicable  Engagement in Group: Not Applicable  Additional Comments:  Pt refused to attend to group this afternoon.  Shakeya Kerkman E 11/20/2020, 6:41 PM
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
## 2020-11-20 NOTE — BHH Counselor (Signed)
CSW attempted to complete PSA with Pt. After searching for Pt on unit CSW discovered that Pt is likely outside engaging in therapeutic group. Continued efforts to complete PSA will be made.   Freddi Che, LCSW

## 2020-11-20 NOTE — Tx Team (Signed)
Interdisciplinary Treatment and Diagnostic Plan Update  11/20/2020 Time of Session: 9:35am  [name] MRN: [id number]  Principal Diagnosis: <principal problem not specified>  Secondary Diagnoses: Active Problems:   Schizophrenia ([place])   Current Medications:  Current Facility-Administered Medications  Medication Dose Route Frequency Provider Last Rate Last Admin  . alum & mag hydroxide-simeth (MAALOX/MYLANTA) 200-200-20 MG/5ML suspension 30 mL  30 mL Oral Q4H PRN Merlyn Lot E, NP      . ARIPiprazole (ABILIFY) tablet 10 mg  10 mg Oral Daily Merlyn Lot E, NP   10 mg at 11/20/20 0803  . haloperidol lactate (HALDOL) injection 5 mg  5 mg Intramuscular Q8H PRN Merlyn Lot E, NP       And  . diphenhydrAMINE (BENADRYL) injection 25 mg  25 mg Intramuscular Q8H PRN Mallie Darting, NP       And  . LORazepam (ATIVAN) injection 1 mg  1 mg Intramuscular Q8H PRN Merlyn Lot E, NP      . FLUoxetine (PROZAC) capsule 20 mg  20 mg Oral Daily Merlyn Lot E, NP   20 mg at 11/20/20 0803  . gabapentin (NEURONTIN) capsule 300 mg  300 mg Oral TID Merlyn Lot E, NP   300 mg at 11/20/20 0803  . hydrOXYzine (ATARAX/VISTARIL) tablet 25 mg  25 mg Oral TID PRN Nwoko, Uchenna E, PA      . magnesium hydroxide (MILK OF MAGNESIA) suspension 30 mL  30 mL Oral Daily PRN Merlyn Lot E, NP      . thiamine tablet 100 mg  100 mg Oral Daily Merlyn Lot E, NP   100 mg at 11/20/20 3295   Or  . thiamine (B-1) injection 100 mg  100 mg Intravenous Daily Merlyn Lot E, NP       PTA Medications: Medications Prior to Admission  Medication Sig Dispense Refill Last Dose  . ARIPiprazole (ABILIFY) 10 MG tablet Take 1 tablet (10 mg total) by mouth daily for 7 days. 7 tablet 0     Patient Stressors: Financial difficulties Substance abuse  Patient Strengths: Average or above average intelligence Motivation for treatment/growth  Treatment Modalities: Medication Management, Group therapy, Case management,   1 to 1 session with clinician, Psychoeducation, Recreational therapy.   Physician Treatment Plan for Primary Diagnosis: <principal problem not specified> Long Term Goal(s):     Short Term Goals:    Medication Management: Evaluate patient's response, side effects, and tolerance of medication regimen.  Therapeutic Interventions: 1 to 1 sessions, Unit Group sessions and Medication administration.  Evaluation of Outcomes: Not Met  Physician Treatment Plan for Secondary Diagnosis: Active Problems:   Schizophrenia ([place])  Long Term Goal(s):     Short Term Goals:       Medication Management: Evaluate patient's response, side effects, and tolerance of medication regimen.  Therapeutic Interventions: 1 to 1 sessions, Unit Group sessions and Medication administration.  Evaluation of Outcomes: Not Met   RN Treatment Plan for Primary Diagnosis: <principal problem not specified> Long Term Goal(s): Knowledge of disease and therapeutic regimen to maintain health will improve  Short Term Goals: Ability to remain free from injury will improve, Ability to participate in decision making will improve, Ability to verbalize feelings will improve, Ability to disclose and discuss suicidal ideas, Ability to identify and develop effective coping behaviors will improve and Compliance with prescribed medications will improve  Medication Management: RN will administer medications as ordered by provider, will assess and evaluate patient's response and provide education to patient for  prescribed medication. RN will report any adverse and/or side effects to prescribing provider.  Therapeutic Interventions: 1 on 1 counseling sessions, Psychoeducation, Medication administration, Evaluate responses to treatment, Monitor vital signs and CBGs as ordered, Perform/monitor CIWA, COWS, AIMS and Fall Risk screenings as ordered, Perform wound care treatments as ordered.  Evaluation of Outcomes: Not Met   LCSW Treatment  Plan for Primary Diagnosis: <principal problem not specified> Long Term Goal(s): Safe transition to appropriate next level of care at discharge, Engage patient in therapeutic group addressing interpersonal concerns.  Short Term Goals: Engage patient in aftercare planning with referrals and resources, Increase social support, Increase ability to appropriately verbalize feelings, Increase emotional regulation, Facilitate acceptance of mental health diagnosis and concerns and Identify triggers associated with mental health/substance abuse issues  Therapeutic Interventions: Assess for all discharge needs, 1 to 1 time with Social worker, Explore available resources and support systems, Assess for adequacy in community support network, Educate family and significant other(s) on suicide prevention, Complete Psychosocial Assessment, Interpersonal group therapy.  Evaluation of Outcomes: Not Met   Progress in Treatment: Attending groups: No. Participating in groups: No. Taking medication as prescribed: Yes. Toleration medication: Yes. Family/Significant other contact made: No, will contact:  If consents are given  Patient understands diagnosis: Yes. Discussing patient identified problems/goals with staff: Yes. Medical problems stabilized or resolved: Yes. Denies suicidal/homicidal ideation: Yes. Issues/concerns per patient self-inventory: No.   New problem(s) identified: No, Describe:  None   New Short Term/Long Term Goal(s): medication stabilization, elimination of SI thoughts, development of comprehensive mental wellness plan.   Patient Goals: "I dont know yet"   Discharge Plan or Barriers: Patient recently admitted. CSW will continue to follow and assess for appropriate referrals and possible discharge planning.   Reason for Continuation of Hospitalization: Hallucinations Medication stabilization Withdrawal symptoms  Estimated Length of Stay: 3 to 5 days   Attendees: Patient: Tyler White 11/20/2020   Physician: Lala Lund, MD 11/20/2020   Nursing:  11/20/2020   RN Care Manager: 11/20/2020   Social Worker: Verdis Frederickson, [place] 11/20/2020   Recreational Therapist:  11/20/2020   Other:  11/20/2020   Other:  11/20/2020   Other: 11/20/2020     Scribe for Treatment Team: Darleen Crocker, [place] 11/20/2020 11:08 AM

## 2020-11-20 NOTE — BHH Group Notes (Signed)
[place] LCSW Group Therapy  11/20/2020 2:17 PM  Type of Therapy:  Why Try Therapy   Participation Level:  Did Not Attend  Participation Quality:  Did not attend   Affect:  Did not attend   Cognitive:  Did not attend   Insight:  Did not attend  Engagement in Therapy:  Did not attend  Modes of Intervention:  Activity and Discussion  Summary of Progress/Problems: Pt did not attend the group  Darleen Crocker 11/20/2020, 2:17 PM

## 2020-11-20 NOTE — Progress Notes (Signed)
   11/19/20 2357  Psych Admission Type (Psych Patients Only)  Admission Status Voluntary  Psychosocial Assessment  Patient Complaints None  Eye Contact Brief  Facial Expression Flat  Affect Blunted  Speech Soft  Interaction Avoidant;Guarded  Motor Activity Fidgety;Restless  Appearance/Hygiene Poor hygiene;In scrubs  Behavior Characteristics Guarded  Mood Depressed  Thought Process  Coherency Blocking  Content Preoccupation  Delusions None reported or observed  Perception UTA  Hallucination Auditory  Judgment Impaired  Confusion UTA  Danger to Self  Current suicidal ideation? Denies  Danger to Others  Danger to Others None reported or observed

## 2020-11-21 DIAGNOSIS — F203 Undifferentiated schizophrenia: Secondary | ICD-10-CM | POA: Diagnosis not present

## 2020-11-21 NOTE — Progress Notes (Signed)
   11/21/20 2048  COVID-19 Daily Checkoff  Have you had a fever (temp > 37.80C/100F)  in the past 24 hours?  No  COVID-19 EXPOSURE  Have you traveled outside the state in the past 14 days? No  Have you been in contact with someone with a confirmed diagnosis of COVID-19 or PUI in the past 14 days without wearing appropriate PPE? No  Have you been living in the same home as a person with confirmed diagnosis of COVID-19 or a PUI (household contact)? No  Have you been diagnosed with COVID-19? No

## 2020-11-21 NOTE — Progress Notes (Signed)
Pt is alert and oriented to person, place time and situation. Pt's affect is blunted, makes fair eye contact, pt is hypoverbal and forwards little, does not initiate interaction with staff or peers. Pt is guarded, thought blocking noted, pt appears internally preoccupied. PT is medication compliant, visible in the dayroom at times, showered, attended pharmacy group, otherwise rests awake and quietly in his room. Pt denies suicidal and homicidal ideation, denies hallucinations, denies feelings of depression and anxiety. Pt has poor insight, unable to report reason for admission.
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
## 2020-11-21 NOTE — Progress Notes (Signed)
[facility] MD Progress Note  11/21/2020 3:34 PM Tyler White  MRN:  [id number] Subjective:  Tyler Muhammadis an 24 y.o.malewho presented to [facility]. He was sent by the shelter because he was suicidal with a plan to OD on some pills that he had found. Patient has been diagnosed with schizophrenia in the past and has been non-compliant with treatment and medications for the past year. He was seen the first of November in the ED because of bizarre behaviors and psychosis, but was medicated, but not admitted to [facility]. He has since been non-compliant with medications again. Patient states that he has attempted suicide on one occasion in the past by cutting. He states that he has not cut in a long time. Patient states that his last hospitalization was a year ago at [facility]. He has no current outpatient provider. Patient was recently staying with his brother, but is now living in the shelter with minimal support. Patient states that he is not used to living in a shelter and it is anxiety provoking for him.    Pt was seen and evaluated on the unit. He denies suicidal thoughts, plan or intent. He denies he wants to hurt others. He denies auditory and visual hallucinations but appears to be responding to internal stimuli. He is thought blocking. Initially he was found in the dayroom, sitting by himself, not connecting to the other patients. He makes poor eye contact. He is blunted, guarded and flat. He stated he slept well last night and his appetite is good. He answers yes and no to questions. He is currently living in a shelter, previously had been living with a brother. He has a history of cocaine and marijuana use and UDS was positive for both on admission. He is taking his medications as prescribed and has had no behavioral issues on the unit.   Principal Problem: <principal problem not specified> Diagnosis: Active Problems:   Schizophrenia ([place])  Total Time spent with patient: 35 minutes  Past  Psychiatric History: See H&P  Past Medical History:  Past Medical History:  Diagnosis Date  . Hernia, inguinal, right   . Inguinal hernia    right  . Psychiatric illness   . Schizophrenia ([place])    History reviewed. No pertinent surgical history. Family History:  Family History  Problem Relation Age of Onset  . Psychiatric Illness Mother   . Hypertension Sister    Family Psychiatric  History: See H&P Social History:  Social History   Substance and Sexual Activity  Alcohol Use Yes     Social History   Substance and Sexual Activity  Drug Use Yes  . Types: Marijuana    Social History   Socioeconomic History  . Marital status: Single    Spouse name: Not on file  . Number of children: Not on file  . Years of education: Not on file  . Highest education level: Not on file  Occupational History  . Not on file  Tobacco Use  . Smoking status: Current Every Day Smoker    Packs/day: 0.50    Years: 5.00    Pack years: 2.50    Types: Cigarettes  . Smokeless tobacco: Never Used  Vaping Use  . Vaping Use: Never used  Substance and Sexual Activity  . Alcohol use: Yes  . Drug use: Yes    Types: Marijuana  . Sexual activity: Yes    Birth control/protection: None  Other Topics Concern  . Not on file  Social History Narrative   **  Merged History Encounter **       Social Determinants of Health   Financial Resource Strain:   . Difficulty of Paying Living Expenses: Not on file  Food Insecurity:   . Worried About Charity fundraiser in the Last Year: Not on file  . Ran Out of Food in the Last Year: Not on file  Transportation Needs:   . Lack of Transportation (Medical): Not on file  . Lack of Transportation (Non-Medical): Not on file  Physical Activity:   . Days of Exercise per Week: Not on file  . Minutes of Exercise per Session: Not on file  Stress:   . Feeling of Stress : Not on file  Social Connections:   . Frequency of Communication with Friends and Family:  Not on file  . Frequency of Social Gatherings with Friends and Family: Not on file  . Attends Religious Services: Not on file  . Active Member of Clubs or Organizations: Not on file  . Attends Archivist Meetings: Not on file  . Marital Status: Not on file   Additional Social History:         Sleep: Fair  Appetite:  Fair  Current Medications: Current Facility-Administered Medications  Medication Dose Route Frequency Provider Last Rate Last Admin  . alum & mag hydroxide-simeth (MAALOX/MYLANTA) 200-200-20 MG/5ML suspension 30 mL  30 mL Oral Q4H PRN Merlyn Lot E, NP      . haloperidol lactate (HALDOL) injection 5 mg  5 mg Intramuscular Q8H PRN Merlyn Lot E, NP       And  . diphenhydrAMINE (BENADRYL) injection 25 mg  25 mg Intramuscular Q8H PRN Mallie Darting, NP       And  . LORazepam (ATIVAN) injection 1 mg  1 mg Intramuscular Q8H PRN Merlyn Lot E, NP      . gabapentin (NEURONTIN) capsule 300 mg  300 mg Oral TID Merlyn Lot E, NP   300 mg at 11/21/20 1256  . hydrOXYzine (ATARAX/VISTARIL) tablet 25 mg  25 mg Oral TID PRN Nwoko, Uchenna E, PA   25 mg at 11/20/20 2110  . magnesium hydroxide (MILK OF MAGNESIA) suspension 30 mL  30 mL Oral Daily PRN Merlyn Lot E, NP      . OLANZapine (ZYPREXA) tablet 5 mg  5 mg Oral BID Cristofano, Dorene Ar, MD   5 mg at 11/21/20 0808  . thiamine tablet 100 mg  100 mg Oral Daily Merlyn Lot E, NP   100 mg at 11/21/20 8119   Or  . thiamine (B-1) injection 100 mg  100 mg Intravenous Daily Mallie Darting, NP        Lab Results: No results found for this or any previous visit (from the past 48 hour(s)).  Blood Alcohol level:  Lab Results  Component Value Date   ETH 23 (H) 11/18/2020   ETH 82 (H) 14/78/2956    Metabolic Disorder Labs: Lab Results  Component Value Date   HGBA1C 5.1 11/02/2020   MPG 99.67 11/02/2020   MPG 85.32 05/30/2018   Lab Results  Component Value Date   PROLACTIN 32.5 (H) 05/30/2018   Lab  Results  Component Value Date   CHOL 148 11/02/2020   TRIG 47 11/02/2020   HDL 71 11/02/2020   CHOLHDL 2.1 11/02/2020   VLDL 9 11/02/2020   LDLCALC 68 11/02/2020   LDLCALC 107 (H) 05/30/2018    Physical Findings: AIMS: Facial and Oral Movements Muscles of Facial Expression: None, normal  Lips and Perioral Area: None, normal Jaw: None, normal Tongue: None, normal,Extremity Movements Upper (arms, wrists, hands, fingers): None, normal Lower (legs, knees, ankles, toes): None, normal, Trunk Movements Neck, shoulders, hips: None, normal, Overall Severity Severity of abnormal movements (highest score from questions above): None, normal Incapacitation due to abnormal movements: None, normal Patient's awareness of abnormal movements (rate only patient's report): No Awareness, Dental Status Current problems with teeth and/or dentures?: No Does patient usually wear dentures?: No  CIWA:  CIWA-Ar Total: 0 COWS:     Musculoskeletal: Strength & Muscle Tone: within normal limits Gait & Station: normal Patient leans: N/A  Psychiatric Specialty Exam: Physical Exam  Review of Systems  Blood pressure (!) 133/102, pulse 62, temperature 98.5 F (36.9 C), temperature source Oral, resp. rate 18, height 5\' 8"  (1.727 m), weight 59 kg, SpO2 100 %.Body mass index is 19.77 kg/m.  General Appearance: Casual  Eye Contact:  Poor  Speech:  Slow  Volume:  Decreased  Mood:  Depressed  Affect:  Blunt, Congruent, Depressed and Flat  Thought Process:  Disorganized  Orientation:  Full (Time, Place, and Person)  Thought Content:  Illogical, Hallucinations: Auditory and Ideas of Reference:   Paranoia  Suicidal Thoughts:  No  Homicidal Thoughts:  No  Memory:  Immediate;   Fair Recent;   Fair Remote;   Fair  Judgement:  Poor  Insight:  Lacking  Psychomotor Activity:  Normal  Concentration:  Concentration: Poor and Attention Span: Fair  Recall:  [employer] of Knowledge:  Fair  Language:  Fair   Akathisia:  No  Handed:  Right  AIMS (if indicated):     Assets:  Physical Health Resilience  ADL's:  Impaired  Cognition:  WNL  Sleep:  Number of Hours: 6.75     Treatment Plan Summary: Daily contact with patient to assess and evaluate symptoms and progress in treatment and Medication management   Zyprexa 5 mg PO BID, consider increase tomorrow if patient is responding well.  Thiamine 100 mg PO  Neurontin 300 mg PO TID Haldol agitation protocol   Inpatient hospitalization for safety and stabilization.   Patient will participate in the therapeutic group milieu.   Discharge disposition in progress.     Ethelene Hal, NP 11/21/2020, 3:34 PM

## 2020-11-22 DIAGNOSIS — F203 Undifferentiated schizophrenia: Secondary | ICD-10-CM | POA: Diagnosis not present

## 2020-11-22 MED ORDER — OLANZAPINE 10 MG PO TBDP
10.0000 mg | ORAL_TABLET | Freq: Every day | ORAL | Status: DC
  Filled 2020-11-22 (×2): qty 1

## 2020-11-22 MED ORDER — PALIPERIDONE PALMITATE ER 234 MG/1.5ML IM SUSY
234.0000 mg | PREFILLED_SYRINGE | Freq: Once | INTRAMUSCULAR | Status: DC
  Filled 2020-11-22: qty 1.5

## 2020-11-22 MED ORDER — PALIPERIDONE ER 6 MG PO TB24
6.0000 mg | ORAL_TABLET | Freq: Every day | ORAL | Status: DC
  Administered 2020-11-22 – 2020-11-25 (×3): 6 mg via ORAL
  Filled 2020-11-22 (×6): qty 1

## 2020-11-22 MED ORDER — OLANZAPINE 5 MG PO TABS
5.0000 mg | ORAL_TABLET | Freq: Every day | ORAL | Status: DC
  Filled 2020-11-22 (×2): qty 1

## 2020-11-22 NOTE — Progress Notes (Signed)
Pt is alert and oriented to person, place, time but not to situation. Pt reports he is not sure why he is in the hospital, pt is medication complaint, affect is blunted, pt is hypoverbal, does not initiate interaction with staff or peers, showered, provided his urine sample requested for testing in lab. Pt spends time in his room resting awake and quietly. Pt stares inappropriately, when asked a question there is a significant delay, thought blocking noted. No distress noted, none reported, pt denies suicidal and homicidal ideation, denies hallucinations, denies feelings of depression and anxiety. Will continue to monitor pt per Q15 minute face checks and monitor for safety and progress.

## 2020-11-22 NOTE — Progress Notes (Signed)
Upon assessment, pt was sitting in the chair located outside of the lab room. Pt is very minimal and guarded. He has some thought blocking and is very slow to respond. He doesn't elaborate with his answers. He was in the dayroom for a little bit earlier, but wasn't interacting with his peers. Pt was asked if he was experiencing any withdrawal symptoms and he said irritability. He denies SI/HI and AVH, although it seems like he may be responding to internal stimuli. He is internally preoccupied. He is medication compliant. Active listening, reassurance, and support provided. Q 15 min safety checks continue. Pt's safety has been maintained.   11/21/20 2048  Psych Admission Type (Psych Patients Only)  Admission Status Voluntary  Psychosocial Assessment  Patient Complaints Irritability  Eye Contact Brief  Facial Expression Flat  Affect Blunted  Speech Soft;Slow;Elective mutism  Interaction Avoidant;Forwards little;Minimal;Guarded  Motor Activity Fidgety  Appearance/Hygiene Poor hygiene;In scrubs  Behavior Characteristics Cooperative;Guarded  Mood Depressed;Sad;Sullen  Thought Process  Coherency Blocking  Content Preoccupation  Delusions None reported or observed  Perception WDL  Hallucination Auditory  Judgment Impaired  Confusion None  Danger to Self  Current suicidal ideation? Denies  Danger to Others  Danger to Others None reported or observed

## 2020-11-22 NOTE — Progress Notes (Signed)
[facility] MD Progress Note  11/22/2020 11:22 AM Tyler White  MRN:  [id number] Subjective:  Tyler Muhammadis an 24 y.o.malewho presented to [facility]. He was sent by the shelter because he was suicidal with a plan to OD on some pills that he had found. Patient has been diagnosed with schizophrenia in the past and has been non-compliant with treatment and medications for the past year. He was seen the first of November in the ED because of bizarre behaviors and psychosis, but was medicated, but not admitted to [facility]. He has since been non-compliant with medications. Patient states that he has attempted suicide on one occasion in the past by cutting. He states that he has not cut in a long time. Patient states that his last hospitalization was a year ago at [facility]. He has no current outpatient provider. Patient was recently staying with his brother, but is now living in the shelter with minimal support. Patient states that he is not used to living in a shelter and it is anxiety provoking for him.    Pt was seen and evaluated on the unit. He denies suicidal thoughts, plan or intent. He denies he wants to hurt others. He denies auditory and visual hallucinations but appears to be responding to internal stimuli, a little less today. His thought blocking has decreased. He continues to be guarded with a flat affect. His eye contact is fair. He stated he slept well last night and his appetite is good. He offers more information today and stated he has schizoaffective disorder. He was on Invega LAI and stated he had his last injection in March or April. He is agreeable to starting back on Invega. He stated he was living at his brother's but left because some lady in the office saw him doing strange things. He stated he was at the [facility] on Saturday and the ambulance was called. He is currently living in a shelter and plans to go back to a shelter when he is discharged.  He has a history of cocaine and marijuana  use and UDS was positive for both on admission. He is taking his medications as prescribed and has had no behavioral issues on the unit.   Principal Problem: <principal problem not specified> Diagnosis: Active Problems:   Schizophrenia ([place])  Total Time spent with patient: 35 minutes  Past Psychiatric History: See H&P  Past Medical History:  Past Medical History:  Diagnosis Date  . Hernia, inguinal, right   . Inguinal hernia    right  . Psychiatric illness   . Schizophrenia ([place])    History reviewed. No pertinent surgical history. Family History:  Family History  Problem Relation Age of Onset  . Psychiatric Illness Mother   . Hypertension Sister    Family Psychiatric  History: See H&P Social History:  Social History   Substance and Sexual Activity  Alcohol Use Yes     Social History   Substance and Sexual Activity  Drug Use Yes  . Types: Marijuana    Social History   Socioeconomic History  . Marital status: Single    Spouse name: Not on file  . Number of children: Not on file  . Years of education: Not on file  . Highest education level: Not on file  Occupational History  . Not on file  Tobacco Use  . Smoking status: Current Every Day Smoker    Packs/day: 0.50    Years: 5.00    Pack years: 2.50    Types: Cigarettes  .  Smokeless tobacco: Never Used  Vaping Use  . Vaping Use: Never used  Substance and Sexual Activity  . Alcohol use: Yes  . Drug use: Yes    Types: Marijuana  . Sexual activity: Yes    Birth control/protection: None  Other Topics Concern  . Not on file  Social History Narrative   ** Merged History Encounter **       Social Determinants of Health   Financial Resource Strain:   . Difficulty of Paying Living Expenses: Not on file  Food Insecurity:   . Worried About Charity fundraiser in the Last Year: Not on file  . Ran Out of Food in the Last Year: Not on file  Transportation Needs:   . Lack of Transportation (Medical): Not on  file  . Lack of Transportation (Non-Medical): Not on file  Physical Activity:   . Days of Exercise per Week: Not on file  . Minutes of Exercise per Session: Not on file  Stress:   . Feeling of Stress : Not on file  Social Connections:   . Frequency of Communication with Friends and Family: Not on file  . Frequency of Social Gatherings with Friends and Family: Not on file  . Attends Religious Services: Not on file  . Active Member of Clubs or Organizations: Not on file  . Attends Archivist Meetings: Not on file  . Marital Status: Not on file   Additional Social History:         Sleep: Fair  Appetite:  Fair  Current Medications: Current Facility-Administered Medications  Medication Dose Route Frequency Provider Last Rate Last Admin  . alum & mag hydroxide-simeth (MAALOX/MYLANTA) 200-200-20 MG/5ML suspension 30 mL  30 mL Oral Q4H PRN Merlyn Lot E, NP      . haloperidol lactate (HALDOL) injection 5 mg  5 mg Intramuscular Q8H PRN Merlyn Lot E, NP       And  . diphenhydrAMINE (BENADRYL) injection 25 mg  25 mg Intramuscular Q8H PRN Mallie Darting, NP       And  . LORazepam (ATIVAN) injection 1 mg  1 mg Intramuscular Q8H PRN Merlyn Lot E, NP      . gabapentin (NEURONTIN) capsule 300 mg  300 mg Oral TID Merlyn Lot E, NP   300 mg at 11/22/20 0756  . hydrOXYzine (ATARAX/VISTARIL) tablet 25 mg  25 mg Oral TID PRN Nwoko, Uchenna E, PA   25 mg at 11/21/20 2048  . magnesium hydroxide (MILK OF MAGNESIA) suspension 30 mL  30 mL Oral Daily PRN Mallie Darting, NP      . Derrill Memo ON 11/23/2020] OLANZapine (ZYPREXA) tablet 5 mg  5 mg Oral Daily Ethelene Hal, NP      . OLANZapine zydis (ZYPREXA) disintegrating tablet 10 mg  10 mg Oral QHS Ethelene Hal, NP      . thiamine tablet 100 mg  100 mg Oral Daily Merlyn Lot E, NP   100 mg at 11/22/20 0756   Or  . thiamine (B-1) injection 100 mg  100 mg Intravenous Daily Merlyn Lot E, NP        Lab Results: No  results found for this or any previous visit (from the past 48 hour(s)).  Blood Alcohol level:  Lab Results  Component Value Date   ETH 23 (H) 11/18/2020   ETH 82 (H) 02/54/2706    Metabolic Disorder Labs: Lab Results  Component Value Date   HGBA1C 5.1 11/02/2020  MPG 99.67 11/02/2020   MPG 85.32 05/30/2018   Lab Results  Component Value Date   PROLACTIN 32.5 (H) 05/30/2018   Lab Results  Component Value Date   CHOL 148 11/02/2020   TRIG 47 11/02/2020   HDL 71 11/02/2020   CHOLHDL 2.1 11/02/2020   VLDL 9 11/02/2020   LDLCALC 68 11/02/2020   LDLCALC 107 (H) 05/30/2018    Physical Findings: AIMS: Facial and Oral Movements Muscles of Facial Expression: None, normal Lips and Perioral Area: None, normal Jaw: None, normal Tongue: None, normal,Extremity Movements Upper (arms, wrists, hands, fingers): None, normal Lower (legs, knees, ankles, toes): None, normal, Trunk Movements Neck, shoulders, hips: None, normal, Overall Severity Severity of abnormal movements (highest score from questions above): None, normal Incapacitation due to abnormal movements: None, normal Patient's awareness of abnormal movements (rate only patient's report): No Awareness, Dental Status Current problems with teeth and/or dentures?: No Does patient usually wear dentures?: No  CIWA:  CIWA-Ar Total: 1 COWS:     Musculoskeletal: Strength & Muscle Tone: within normal limits Gait & Station: normal Patient leans: N/A  Psychiatric Specialty Exam: Physical Exam  Review of Systems  Constitutional: Negative for activity change and appetite change.  Respiratory: Negative for chest tightness and shortness of breath.   Cardiovascular: Negative for chest pain.  Gastrointestinal: Negative for abdominal pain.  Neurological: Negative for facial asymmetry and headaches. .    Blood pressure (!) 127/95, pulse 97, temperature 98.2 F (36.8 C), temperature source Oral, resp. rate 18, height 5\' 8"  (1.727 m),  weight 59 kg, SpO2 98 %.Body mass index is 19.77 kg/m.  General Appearance: Casual  Eye Contact:  Fair  Speech:  Slow and but improving  Volume:  Decreased  Mood:  Depressed  Affect:  Blunt, Congruent, Depressed and Flat  Thought Process:  Disorganized  Orientation:  Full (Time, Place, and Person)  Thought Content:  Illogical, Hallucinations: Auditory and Ideas of Reference:   Paranoia  Suicidal Thoughts:  No  Homicidal Thoughts:  No  Memory:  Immediate;   Fair Recent;   Fair Remote;   Fair  Judgement:  Poor  Insight:  Lacking  Psychomotor Activity:  Normal  Concentration:  Concentration: Poor and Attention Span: Fair  Recall:  [employer] of Knowledge:  Fair  Language:  Fair  Akathisia:  No  Handed:  Right  AIMS (if indicated):     Assets:  Physical Health Resilience  ADL's:  Impaired  Cognition:  WNL  Sleep:  Number of Hours: 6.75     Treatment Plan Summary: Daily contact with patient to assess and evaluate symptoms and progress in treatment and Medication management   Discontinue Zyprexa.  Add Invega 6 mg PO QHS Invega Sustenna 234 mg IM, then 156 mg 1 week later (day8). Then monthly.   Continue Thiamine 100 mg PO  Continue Neurontin 300 mg PO TID Continue Haldol agitation protocol   Inpatient hospitalization for safety and stabilization.   Patient will participate in the therapeutic group milieu.   Discharge disposition in progress.     Ethelene Hal, NP 11/22/2020, 11:22 AM

## 2020-11-22 NOTE — BHH Suicide Risk Assessment (Signed)
[place] INPATIENT:  Family/Significant Other Suicide Prevention Education  Suicide Prevention Education:  Contact Attempts: Tyler White [phone] ([place]) has been identified by the patient as the family member/significant other with whom the patient will be residing, and identified as the person(s) who will aid the patient in the event of a mental health crisis.  With written consent from the patient, two attempts were made to provide suicide prevention education, prior to and/or following the patient's discharge.  We were unsuccessful in providing suicide prevention education.  A suicide education pamphlet was given to the patient to share with family/significant other.  Date and time of first attempt:11/21/20 at 11:30am Date and time of second attempt:11/22/20 at 11:00am   CSW completed SPE with the patient.  Pamphlet placed in patient's chart.   [name] Tyler White 11/22/2020, 11:28 AM

## 2020-11-22 NOTE — Progress Notes (Signed)
Recreation Therapy Notes  Date:  11.24.21 Time: 0930 Location: 300 Hall Dayroom  Group Topic: Stress Management  Goal Area(s) Addresses:  Patient will identify positive stress management techniques. Patient will identify benefits of using stress management post d/c.  Intervention: Stress Management  Activity: Meditation.  LRT played a meditation that focused on finding happiness in the present moment.  Patients were to listen and follow along as meditation played in order to fully engage in activity.    Education:  Stress Management, Discharge Planning.   Education Outcome: Acknowledges Education  Clinical Observations/Feedback:  Pt did not attend group activity.    Victorino Sparrow, LRT/CTRS        Victorino Sparrow A 11/22/2020 11:51 AM

## 2020-11-22 NOTE — BHH Group Notes (Signed)
[place] LCSW Group Therapy  11/22/2020 1:53 PM  Type of Therapy:  Cognitive Distortions and Activations   Participation Level:  Did Not Attend  Participation Quality:  Did not attend  Affect:  Did not attend  Cognitive:  Did not attend   Insight:  Did not attend  Engagement in Therapy:  Did not attend  Modes of Intervention:  Activity and Education  Summary of Progress/Problems: Pt did not attend the group  Darleen Crocker 11/22/2020, 1:53 PM

## 2020-11-23 DIAGNOSIS — F200 Paranoid schizophrenia: Secondary | ICD-10-CM

## 2020-11-23 MED ORDER — PALIPERIDONE PALMITATE ER 156 MG/ML IM SUSY
156.0000 mg | PREFILLED_SYRINGE | Freq: Once | INTRAMUSCULAR | Status: DC
  Filled 2020-11-23: qty 1

## 2020-11-23 MED ORDER — PALIPERIDONE PALMITATE ER 234 MG/1.5ML IM SUSY
234.0000 mg | PREFILLED_SYRINGE | Freq: Once | INTRAMUSCULAR | Status: AC
  Administered 2020-11-23: 234 mg via INTRAMUSCULAR

## 2020-11-23 NOTE — Progress Notes (Signed)
[facility] MD Progress Note  11/23/2020 10:43 AM Tyler White  MRN:  [id number]  Subjective: Tyler White reports, "I have been 5 days because I was seeing things & hearing voices. But, these are better now. I feel rested & I'm sleeping well". Tyler White denies any SIHI, AVH, delusional thoughts or paranoia. There seem to be presence of thought blocking episodes still. He does not appear to be in any apparent distress. Tolerating medications well. Denies side effects.    Objective: Tyler Muhammadis an 24 y.o.malewho presented to [facility]. He was sent by the shelter because he was suicidal with a plan to OD on some pills that he had found. Patient has been diagnosed with schizophrenia in the past and has been non-compliant with treatment and medications for the past year. He was seen the first of November in the ED because of bizarre behaviors and psychosis, but was medicated, but not admitted to [facility]. He has since been non-compliant with medications. Patient states that he has attempted suicide on one occasion in the past by cutting. He states that he has not cut in a long time. Patient states that his last hospitalization was a year ago at [facility]. He has no current outpatient provider. Patient was recently staying with his brother, but is now living in the shelter with minimal support. Patient states that he is not used to living in a shelter and it is anxiety provoking for him.    Pt was seen and evaluated on the unit. He denies suicidal thoughts, plan or intent. He denies he wants to hurt others. He denies auditory and visual hallucinations but appears to be responding to internal stimuli, a little less today. His thought blocking has decreased. He continues to be guarded with a flat affect. His eye contact is fair. He stated he slept well last night and his appetite is good. He offers more information today and stated he has schizoaffective disorder. He was on Invega LAI and stated he had his last  injection in March or April. He is agreeable to starting back on Invega. He stated he was living at his brother's but left because some lady in the office saw him doing strange things. He stated he was at the [facility] on Saturday and the ambulance was called. He is currently living in a shelter and plans to go back to a shelter when he is discharged.  He has a history of cocaine and marijuana use and UDS was positive for both on admission. He is taking his medications as prescribed and has had no behavioral issues on the unit.   Principal Problem: <principal problem not specified> Diagnosis: Active Problems:   Schizophrenia ([place])  Total Time spent with patient: 15 minutes  Past Psychiatric History: See H&P  Past Medical History:  Past Medical History:  Diagnosis Date  . Hernia, inguinal, right   . Inguinal hernia    right  . Psychiatric illness   . Schizophrenia ([place])    History reviewed. No pertinent surgical history. Family History:  Family History  Problem Relation Age of Onset  . Psychiatric Illness Mother   . Hypertension Sister    Family Psychiatric  History: See H&P Social History:  Social History   Substance and Sexual Activity  Alcohol Use Yes     Social History   Substance and Sexual Activity  Drug Use Yes  . Types: Marijuana    Social History   Socioeconomic History  . Marital status: Single    Spouse name: Not  on file  . Number of children: Not on file  . Years of education: Not on file  . Highest education level: Not on file  Occupational History  . Not on file  Tobacco Use  . Smoking status: Current Every Day Smoker    Packs/day: 0.50    Years: 5.00    Pack years: 2.50    Types: Cigarettes  . Smokeless tobacco: Never Used  Vaping Use  . Vaping Use: Never used  Substance and Sexual Activity  . Alcohol use: Yes  . Drug use: Yes    Types: Marijuana  . Sexual activity: Yes    Birth control/protection: None  Other Topics Concern  . Not on file   Social History Narrative   ** Merged History Encounter **       Social Determinants of Health   Financial Resource Strain:   . Difficulty of Paying Living Expenses: Not on file  Food Insecurity:   . Worried About Charity fundraiser in the Last Year: Not on file  . Ran Out of Food in the Last Year: Not on file  Transportation Needs:   . Lack of Transportation (Medical): Not on file  . Lack of Transportation (Non-Medical): Not on file  Physical Activity:   . Days of Exercise per Week: Not on file  . Minutes of Exercise per Session: Not on file  Stress:   . Feeling of Stress : Not on file  Social Connections:   . Frequency of Communication with Friends and Family: Not on file  . Frequency of Social Gatherings with Friends and Family: Not on file  . Attends Religious Services: Not on file  . Active Member of Clubs or Organizations: Not on file  . Attends Archivist Meetings: Not on file  . Marital Status: Not on file   Additional Social History:   Sleep: Poor  Appetite:  Fair  Current Medications: Current Facility-Administered Medications  Medication Dose Route Frequency Provider Last Rate Last Admin  . alum & mag hydroxide-simeth (MAALOX/MYLANTA) 200-200-20 MG/5ML suspension 30 mL  30 mL Oral Q4H PRN Merlyn Lot E, NP      . haloperidol lactate (HALDOL) injection 5 mg  5 mg Intramuscular Q8H PRN Merlyn Lot E, NP       And  . diphenhydrAMINE (BENADRYL) injection 25 mg  25 mg Intramuscular Q8H PRN Mallie Darting, NP       And  . LORazepam (ATIVAN) injection 1 mg  1 mg Intramuscular Q8H PRN Merlyn Lot E, NP      . gabapentin (NEURONTIN) capsule 300 mg  300 mg Oral TID Merlyn Lot E, NP   300 mg at 11/23/20 0816  . hydrOXYzine (ATARAX/VISTARIL) tablet 25 mg  25 mg Oral TID PRN Adlynn Lowenstein, Uchenna E, PA   25 mg at 11/22/20 2046  . magnesium hydroxide (MILK OF MAGNESIA) suspension 30 mL  30 mL Oral Daily PRN Mallie Darting, NP      . Derrill Memo ON 11/27/2020]  paliperidone (INVEGA SUSTENNA) injection 156 mg  156 mg Intramuscular Once Sharma Covert, MD      . paliperidone [facility] SUSTENNA) injection 234 mg  234 mg Intramuscular Once Ethelene Hal, NP      . paliperidone (INVEGA) 24 hr tablet 6 mg  6 mg Oral QHS Ethelene Hal, NP   6 mg at 11/22/20 2046  . thiamine tablet 100 mg  100 mg Oral Daily Merlyn Lot E, NP   100 mg  at 11/23/20 0817   Or  . thiamine (B-1) injection 100 mg  100 mg Intravenous Daily Mallie Darting, NP        Lab Results: No results found for this or any previous visit (from the past 48 hour(s)).  Blood Alcohol level:  Lab Results  Component Value Date   ETH 23 (H) 11/18/2020   ETH 82 (H) 29/79/8921    Metabolic Disorder Labs: Lab Results  Component Value Date   HGBA1C 5.1 11/02/2020   MPG 99.67 11/02/2020   MPG 85.32 05/30/2018   Lab Results  Component Value Date   PROLACTIN 32.5 (H) 05/30/2018   Lab Results  Component Value Date   CHOL 148 11/02/2020   TRIG 47 11/02/2020   HDL 71 11/02/2020   CHOLHDL 2.1 11/02/2020   VLDL 9 11/02/2020   LDLCALC 68 11/02/2020   LDLCALC 107 (H) 05/30/2018    Physical Findings: AIMS: Facial and Oral Movements Muscles of Facial Expression: None, normal Lips and Perioral Area: None, normal Jaw: None, normal Tongue: None, normal,Extremity Movements Upper (arms, wrists, hands, fingers): None, normal Lower (legs, knees, ankles, toes): None, normal, Trunk Movements Neck, shoulders, hips: None, normal, Overall Severity Severity of abnormal movements (highest score from questions above): None, normal Incapacitation due to abnormal movements: None, normal Patient's awareness of abnormal movements (rate only patient's report): No Awareness, Dental Status Current problems with teeth and/or dentures?: No Does patient usually wear dentures?: No  CIWA:  CIWA-Ar Total: 1 COWS:     Musculoskeletal: Strength & Muscle Tone: within normal limits Gait &  Station: normal Patient leans: N/A  Psychiatric Specialty Exam: Physical Exam Vitals and nursing note reviewed.     Review of Systems  Constitutional: Negative for chills and diaphoresis.  HENT: Negative for congestion and sneezing.   Eyes: Negative for discharge.  Respiratory: Negative for chest tightness, shortness of breath and wheezing.   Cardiovascular: Negative for chest pain and palpitations.  Gastrointestinal: Negative for diarrhea, nausea and vomiting.    Constitutional: Negative for activity change and appetite change.  Respiratory: Negative for chest tightness and shortness of breath.   Cardiovascular: Negative for chest pain.  Gastrointestinal: Negative for abdominal pain.  Neurological: Negative for facial asymmetry and headaches. .    Blood pressure (!) 126/93, pulse (!) 101, temperature 98.2 F (36.8 C), temperature source Oral, resp. rate 18, height 5\' 8"  (1.727 m), weight 59 kg, SpO2 98 %.Body mass index is 19.77 kg/m.  General Appearance: Casual  Eye Contact:  Fair  Speech:  Slow and but improving  Volume:  Decreased  Mood:  Depressed  Affect:  Blunt, Congruent, Depressed and Flat  Thought Process:  Disorganized  Orientation:  Full (Time, Place, and Person)  Thought Content:  Illogical, Hallucinations: Auditory and Ideas of Reference:   Paranoia  Suicidal Thoughts:  No  Homicidal Thoughts:  No  Memory:  Immediate;   Fair Recent;   Fair Remote;   Fair  Judgement:  Poor  Insight:  Lacking  Psychomotor Activity:  Normal  Concentration:  Concentration: Poor and Attention Span: Fair  Recall:  [employer] of Knowledge:  Fair  Language:  Fair  Akathisia:  No  Handed:  Right  AIMS (if indicated):     Assets:  Physical Health Resilience  ADL's:  Impaired  Cognition:  WNL  Sleep:  Number of Hours: 2.25   Treatment Plan Summary: Daily contact with patient to assess and evaluate symptoms and progress in treatment and Medication management.  Continue  inpatient hospitalization. Will continue today 11/23/2020 plan as below except where it is noted.  Discontinued Zyprexa.  Continue Invega 6 mg PO QHS for mood control Invega Sustenna 234 mg IM, then 156 mg 1 week later (Due on 11-27-20). Then Q monthly for mood control.   Continue Thiamine 100 mg PO for thiamine replacement. Continue Neurontin 300 mg PO TID for agitation. Continue Haldol for agitation protocol   Patient will participate in the therapeutic group milieu.   Discharge disposition in progress.   Lindell Spar, NP, PMHNP, FNP-BC 11/23/2020, 10:43 AMPatient ID: [name], male   DOB: 01-Nov-1996, 24 y.o.   MRN: [id number]

## 2020-11-23 NOTE — Progress Notes (Addendum)
Pt appears worried, guarded, minimal / superficial, preoccupied and suspicious on interactions with a blunted affect. Denies SI, HI, AVH and pain. Rates his depression 1/10, hopelessness 2/10 and anxiety 5/10 with 10 being the worst. Pt remains medication compliant, tolerated his [place] well. Declined PRN Vistaril when offered for anxiety level of 5/10. Denies adverse drug reactions.  Scheduled medications given as ordered with verbal education and effects monitored. Safety checks maintained without outburst to noted thus far.  Pt tolerates all meals well. Denies discomfort. Remains safe on and off unit.

## 2020-11-23 NOTE — Progress Notes (Signed)
   11/23/20 2100  Psych Admission Type (Psych Patients Only)  Admission Status Voluntary  Psychosocial Assessment  Patient Complaints Suspiciousness  Eye Contact Brief  Facial Expression Flat  Affect Preoccupied;Blunted  Speech Soft;Slow;Elective mutism  Interaction Avoidant;Forwards little;Guarded;Minimal  Motor Activity Fidgety  Appearance/Hygiene Body odor;Disheveled;Poor hygiene  Behavior Characteristics Cooperative  Mood Depressed;Preoccupied  Aggressive Behavior  Effect No apparent injury  Thought Process  [place] Blocking  Content Preoccupation  Delusions None reported or observed  Perception Hallucinations  Hallucination Auditory;Visual  Judgment Limited  Confusion None  Danger to Self  Current suicidal ideation? Denies  Danger to Others  Danger to Others None reported or observed

## 2020-11-23 NOTE — Progress Notes (Signed)
Pt had better communication this evening , pt continued to be paranoid, but had more than one word responses at times    11/22/20 2300  Psych Admission Type (Psych Patients Only)  Admission Status Voluntary  Psychosocial Assessment  Patient Complaints Suspiciousness  Eye Contact Brief  Facial Expression Flat  Affect Blunted  Speech Soft;Slow;Elective mutism  Interaction Avoidant;Forwards little;Guarded;Minimal  Motor Activity Fidgety  Appearance/Hygiene Poor hygiene;In scrubs  Behavior Characteristics Cooperative  Mood Depressed  Aggressive Behavior  Effect No apparent injury  Thought Process  Coherency Blocking  Content Preoccupation  Delusions None reported or observed  Perception WDL  Hallucination Auditory  Judgment Impaired  Confusion None  Danger to Self  Current suicidal ideation? Denies  Danger to Others  Danger to Others None reported or observed

## 2020-11-24 DIAGNOSIS — F1410 Cocaine abuse, uncomplicated: Secondary | ICD-10-CM

## 2020-11-24 DIAGNOSIS — F12150 Cannabis abuse with psychotic disorder with delusions: Secondary | ICD-10-CM

## 2020-11-24 DIAGNOSIS — F200 Paranoid schizophrenia: Secondary | ICD-10-CM | POA: Diagnosis not present

## 2020-11-24 DIAGNOSIS — F29 Unspecified psychosis not due to a substance or known physiological condition: Secondary | ICD-10-CM

## 2020-11-24 DIAGNOSIS — F1414 Cocaine abuse with cocaine-induced mood disorder: Secondary | ICD-10-CM

## 2020-11-24 MED ORDER — PALIPERIDONE PALMITATE ER 234 MG/1.5ML IM SUSY: 234.0000 mg | PREFILLED_SYRINGE | Freq: Once | INTRAMUSCULAR | Status: DC

## 2020-11-24 MED ORDER — PALIPERIDONE PALMITATE ER 156 MG/ML IM SUSY
156.0000 mg | PREFILLED_SYRINGE | Freq: Once | INTRAMUSCULAR | Status: DC
  Filled 2020-11-24: qty 1

## 2020-11-24 MED ORDER — DIVALPROEX SODIUM ER 250 MG PO TB24
750.0000 mg | ORAL_TABLET | Freq: Every day | ORAL | Status: AC
  Administered 2020-11-25: 750 mg via ORAL
  Filled 2020-11-24: qty 3

## 2020-11-24 MED ORDER — DIVALPROEX SODIUM ER 500 MG PO TB24
500.0000 mg | ORAL_TABLET | Freq: Every day | ORAL | Status: DC
  Filled 2020-11-24: qty 1

## 2020-11-24 MED ORDER — TRAZODONE HCL 50 MG PO TABS
50.0000 mg | ORAL_TABLET | Freq: Once | ORAL | Status: AC
  Administered 2020-11-24: 50 mg via ORAL
  Filled 2020-11-24 (×2): qty 1

## 2020-11-24 MED ORDER — DIVALPROEX SODIUM ER 250 MG PO TB24: 750.0000 mg | ORAL_TABLET | Freq: Every day | ORAL | Status: DC

## 2020-11-24 MED ORDER — DIVALPROEX SODIUM ER 500 MG PO TB24
500.0000 mg | ORAL_TABLET | Freq: Every day | ORAL | Status: DC
  Administered 2020-11-26 – 2020-11-28 (×3): 500 mg via ORAL
  Filled 2020-11-24 (×8): qty 1

## 2020-11-24 MED ORDER — DIVALPROEX SODIUM ER 250 MG PO TB24
750.0000 mg | ORAL_TABLET | Freq: Every day | ORAL | Status: DC
  Filled 2020-11-24: qty 3

## 2020-11-24 MED ORDER — DIVALPROEX SODIUM ER 500 MG PO TB24: 500.0000 mg | ORAL_TABLET | Freq: Every day | ORAL | Status: DC

## 2020-11-24 NOTE — [facility] Counselor (Signed)
Tyler White gave permission for social work to speak with his father. CSW attempted to call [name] at [phone].  There was no answer and the no mailbox to leave a message.

## 2020-11-24 NOTE — Progress Notes (Signed)
Pt continues to keep to himself, pt responding to internal stimuli    11/24/20 2000  Psych Admission Type (Psych Patients Only)  Admission Status Voluntary  Psychosocial Assessment  Patient Complaints Suspiciousness  Eye Contact Brief  Facial Expression Flat  Affect Blunted;Preoccupied  Speech Soft;Slow;Elective mutism  Interaction Avoidant;Forwards little;Guarded;Minimal  Motor Activity Fidgety  Appearance/Hygiene Body odor;Disheveled;Poor hygiene  Behavior Characteristics Cooperative  Mood Depressed  Thought Process  Coherency Blocking  Content Preoccupation  Delusions None reported or observed  Perception Hallucinations  Hallucination Auditory;Visual  Judgment Limited  Confusion None  Danger to Self  Current suicidal ideation? Denies  Danger to Others  Danger to Others None reported or observed

## 2020-11-24 NOTE — [facility] Counselor (Signed)
CSW attempted the other number on file for [facility], [phone]. CSW was able to leave a voicemail for Tyler White.  CSW will continue attempts.

## 2020-11-24 NOTE — Progress Notes (Signed)
Recreation Therapy Notes  Date:  11.26.21 Time: 0930 Location: 300 Hall Group Room  Group Topic: Stress Management  Goal Area(s) Addresses:  Patient will identify positive stress management techniques. Patient will identify benefits of using stress management post d/c.  Intervention: Stress Management  Activity: Meditation.  LRT played a meditation that focused on making the most of your day and the possibilities that possibly await as the day goes on.    Education:  Stress Management, Discharge Planning.   Education Outcome: Acknowledges Education  Clinical Observations/Feedback: Pt did not attend group session.    Victorino Sparrow, LRT/CTRS         Victorino Sparrow A 11/24/2020 11:26 AM

## 2020-11-24 NOTE — Tx Team (Signed)
Interdisciplinary Treatment and Diagnostic Plan Update  11/24/2020 Time of Session: 9:35am  Kmarion Rawl MRN: [id number]  Principal Diagnosis: <principal problem not specified>  Secondary Diagnoses: Active Problems:   Schizophrenia ([place])   Current Medications:  Current Facility-Administered Medications  Medication Dose Route Frequency Provider Last Rate Last Admin  . alum & mag hydroxide-simeth (MAALOX/MYLANTA) 200-200-20 MG/5ML suspension 30 mL  30 mL Oral Q4H PRN Merlyn Lot E, NP      . haloperidol lactate (HALDOL) injection 5 mg  5 mg Intramuscular Q8H PRN Merlyn Lot E, NP       And  . diphenhydrAMINE (BENADRYL) injection 25 mg  25 mg Intramuscular Q8H PRN Mallie Darting, NP       And  . LORazepam (ATIVAN) injection 1 mg  1 mg Intramuscular Q8H PRN Merlyn Lot E, NP      . gabapentin (NEURONTIN) capsule 300 mg  300 mg Oral TID Merlyn Lot E, NP   300 mg at 11/24/20 0804  . hydrOXYzine (ATARAX/VISTARIL) tablet 25 mg  25 mg Oral TID PRN Nwoko, Uchenna E, PA   25 mg at 11/23/20 2053  . magnesium hydroxide (MILK OF MAGNESIA) suspension 30 mL  30 mL Oral Daily PRN Mallie Darting, NP      . Derrill Memo ON 11/27/2020] paliperidone (INVEGA SUSTENNA) injection 156 mg  156 mg Intramuscular Once Sharma Covert, MD      . paliperidone (INVEGA) 24 hr tablet 6 mg  6 mg Oral QHS Ethelene Hal, NP   6 mg at 11/23/20 2053  . thiamine tablet 100 mg  100 mg Oral Daily Merlyn Lot E, NP   100 mg at 11/24/20 6387   Or  . thiamine (B-1) injection 100 mg  100 mg Intravenous Daily Merlyn Lot E, NP       PTA Medications: Medications Prior to Admission  Medication Sig Dispense Refill Last Dose  . ARIPiprazole (ABILIFY) 10 MG tablet Take 1 tablet (10 mg total) by mouth daily for 7 days. 7 tablet 0     Patient Stressors: Financial difficulties Substance abuse  Patient Strengths: Average or above average intelligence Motivation for treatment/growth  Treatment Modalities:  Medication Management, Group therapy, Case management,  1 to 1 session with clinician, Psychoeducation, Recreational therapy.   Physician Treatment Plan for Primary Diagnosis: <principal problem not specified> Long Term Goal(s): Improvement in symptoms so as ready for discharge   Short Term Goals: Ability to identify changes in lifestyle to reduce recurrence of condition will improve Ability to verbalize feelings will improve Ability to disclose and discuss suicidal ideas Ability to demonstrate self-control will improve Ability to identify and develop effective coping behaviors will improve Ability to maintain clinical measurements within normal limits will improve Compliance with prescribed medications will improve  Medication Management: Evaluate patient's response, side effects, and tolerance of medication regimen.  Therapeutic Interventions: 1 to 1 sessions, Unit Group sessions and Medication administration.  Evaluation of Outcomes: Progressing  Physician Treatment Plan for Secondary Diagnosis: Active Problems:   Schizophrenia ([place])  Long Term Goal(s): Improvement in symptoms so as ready for discharge   Short Term Goals: Ability to identify changes in lifestyle to reduce recurrence of condition will improve Ability to verbalize feelings will improve Ability to disclose and discuss suicidal ideas Ability to demonstrate self-control will improve Ability to identify and develop effective coping behaviors will improve Ability to maintain clinical measurements within normal limits will improve Compliance with prescribed medications will improve     Medication  Management: Evaluate patient's response, side effects, and tolerance of medication regimen.  Therapeutic Interventions: 1 to 1 sessions, Unit Group sessions and Medication administration.  Evaluation of Outcomes: Progressing   RN Treatment Plan for Primary Diagnosis: <principal problem not specified> Long Term Goal(s):  Knowledge of disease and therapeutic regimen to maintain health will improve  Short Term Goals: Ability to remain free from injury will improve, Ability to participate in decision making will improve, Ability to verbalize feelings will improve, Ability to disclose and discuss suicidal ideas, Ability to identify and develop effective coping behaviors will improve and Compliance with prescribed medications will improve  Medication Management: RN will administer medications as ordered by provider, will assess and evaluate patient's response and provide education to patient for prescribed medication. RN will report any adverse and/or side effects to prescribing provider.  Therapeutic Interventions: 1 on 1 counseling sessions, Psychoeducation, Medication administration, Evaluate responses to treatment, Monitor vital signs and CBGs as ordered, Perform/monitor CIWA, COWS, AIMS and Fall Risk screenings as ordered, Perform wound care treatments as ordered.  Evaluation of Outcomes: Progressing   LCSW Treatment Plan for Primary Diagnosis: <principal problem not specified> Long Term Goal(s): Safe transition to appropriate next level of care at discharge, Engage patient in therapeutic group addressing interpersonal concerns.  Short Term Goals: Engage patient in aftercare planning with referrals and resources, Increase social support, Increase ability to appropriately verbalize feelings, Increase emotional regulation, Facilitate acceptance of mental health diagnosis and concerns and Identify triggers associated with mental health/substance abuse issues  Therapeutic Interventions: Assess for all discharge needs, 1 to 1 time with Social worker, Explore available resources and support systems, Assess for adequacy in community support network, Educate family and significant other(s) on suicide prevention, Complete Psychosocial Assessment, Interpersonal group therapy.  Evaluation of Outcomes:  Progressing   Progress in Treatment: Attending groups: No. Participating in groups: No. Taking medication as prescribed: Yes. Toleration medication: Yes. Family/Significant other contact made: Yes, individual(s) contacted:  Pt's mother. Patient understands diagnosis: Yes. Discussing patient identified problems/goals with staff: Yes. Medical problems stabilized or resolved: Yes. Denies suicidal/homicidal ideation: Yes. Issues/concerns per patient self-inventory: No.   New problem(s) identified: No, Describe:  None   New Short Term/Long Term Goal(s): medication stabilization, elimination of SI thoughts, development of comprehensive mental wellness plan.   Patient Goals: "I dont know yet"   Discharge Plan or Barriers: Pt has been referred for services with [facility] in [place].  Reason for Continuation of Hospitalization: Hallucinations Medication stabilization Withdrawal symptoms  Estimated Length of Stay: 3 to 5 days   Attendees: Patient:  11/20/2020   Physician: 11/20/2020   Nursing:  11/20/2020   RN Care Manager: 11/20/2020   Social Worker: Freddi Che, LCSW 11/20/2020   Recreational Therapist:  11/20/2020   Other:  11/20/2020   Other:  11/20/2020   Other: 11/20/2020     Scribe for Treatment Team: Freddi Che, LCSW 11/24/2020 8:39 AM

## 2020-11-24 NOTE — Progress Notes (Signed)
[facility] MD Progress Note  11/24/2020 5:43 PM Tyler White  MRN:  [id number]  HPI: Tyler White is a 24 y.o. male with a history of schizophrenia who has been living in a shelter and was sent to the emergency room for evaluation due to stating he was suicidal with a plan to overdose on some pills that he had found.Patient has been diagnosed with schizophrenia in the past and has been non-compliant with treatment and medications for the past year. He was seen the first of November in the ED because of bizarre behaviors and psychosis, but was medicated, but not admitted to [facility]. He has since been non-compliant with medications. Patient states that he has attempted suicide on one occasion in the past by cutting. He states that he has not cut in a long time. Patient states that his last hospitalization was a year ago at [facility]. He has no current outpatient provider. Patient was recently staying with his brother, but is now living in the shelter with minimal support. Patient states that he is not used to living in a shelter and it is anxiety provoking for him.    Patient is seen chart is reviewed  Subjective: Patient appears sedated at beginning of assessment. He is easily aroused and agreeable to coming to provider office for interviewing. Continues to endorse auditory hallucinations, but states they are getting better. He describes that he is, "feeling down." He continues to have suicidal thoughts, and a plan to overdose on XTC. When asked where he would get these pills, he states that he "sells self out"." On further questioning, patient endorses exchanging sexual acts for drugs or money. He reports he uses XTC, cocaine, weed, alcohol and tobacco. He reports that he has been staying in the [facility] house/shelter, but reports not feeling comfortable there. He has given consent for [place] to speak with his mother (lives in [place]), however the phone contact for mother is not accurate. He also  gives consent for [place] to speak with his father Edmonia Caprio [phone] in [place]). Patient states that he has not talked to his parents in approximately 2 months patient describes having been diagnosed with schizoaffective disorder and previously went to the [facility]. He has not continued in treatment. Patient states that he has not been able to have a job since he graduated from [employer]. He does state that he is willing to work. He talks about his 5 siblings, 3 sisters and 2 brothers (oldest sister 40 lives in [place], 51 year old brother lives in [place], 40 year old brother lives in [place], 2 sisters ages 60, 98 years).  Patient sees an old picture of himself, and states that he likes that [place] better than the person he is now.  He states that he would like to stop using drugs, and live a better lifestyle.  During this encounter, he has shown a video from a sober living environment.  Contact is made with that facility to see if he could be accepted as a resident.  However due to his schizophrenia/dual diagnosis he is not eligible at this time.  Patient is agreeable to going to a shelter, but wishes to discharge to a shelter that can provide substance abuse services.  Patient's thoughts are slow and concrete during visit.  There are times where he has thought blocking.  He continues to endorse auditory hallucinations, but cannot make out what they are saying currently.  He reports previously they were very negative.  He is denying  visual hallucinations today.  He is denying any active suicidal ideation, but continues to have concern for his safety.  He denies homicidal ideation.  He states that he has been feeling tired, and it has been difficult for him to attend groups.  He describes that he has had difficulty sleeping.  His appetite is fair.  On reevaluation with social worker to get consent for calling family, patient continues to appear  extremely sedated. He continues to be guarded and has a constricted affect.   Objective: Patient is medication compliant and has had no behavioral issues on the unit. Sleep overnight was poor, nursing reports 2.25 hours.  He is eating with fair appetite.  He has not been attending groups. Labs reviewed.  Urine drug screen positive for cocaine on UDS on this admission previously had been positive for tetrahydrocannabinol and cocaine (last 2 years ago). He was on Invega LAI and stated he had his last injection in March or April. He is agreeable to starting back on [place] and received [place] 234 mg on 11/23/2020. He is scheduled for Invega Sustenna 156 mg on 11/29/2020.  Principal Problem: Schizophrenia ([place]) Diagnosis: Principal Problem:   Schizophrenia ([place]) Active Problems:   Cannabis abuse with psychotic disorder, with delusions ([place])   Psychosis ([place])   Cocaine abuse ([name])   Cocaine abuse with cocaine-induced mood disorder ([place])  Total Time spent with patient: 65 minutes  Past Psychiatric History: See H&P  Past Medical History:  Past Medical History:  Diagnosis Date  . Hernia, inguinal, right   . Inguinal hernia    right  . Psychiatric illness   . Schizophrenia ([place])    History reviewed. No pertinent surgical history. Family History:  Family History  Problem Relation Age of Onset  . Psychiatric Illness Mother   . Hypertension Sister    Family Psychiatric  History: See H&P  Social History:  Social History   Substance and Sexual Activity  Alcohol Use Yes     Social History   Substance and Sexual Activity  Drug Use Yes  . Types: Marijuana    Social History   Socioeconomic History  . Marital status: Single    Spouse name: Not on file  . Number of children: Not on file  . Years of education: Not on file  . Highest education level: Not on file  Occupational History  . Not on file  Tobacco Use  . Smoking status: Current Every Day Smoker    Packs/day:  0.50    Years: 5.00    Pack years: 2.50    Types: Cigarettes  . Smokeless tobacco: Never Used  Vaping Use  . Vaping Use: Never used  Substance and Sexual Activity  . Alcohol use: Yes  . Drug use: Yes    Types: Marijuana  . Sexual activity: Yes    Birth control/protection: None  Other Topics Concern  . Not on file  Social History Narrative   ** Merged History Encounter **       Social Determinants of Health   Financial Resource Strain:   . Difficulty of Paying Living Expenses: Not on file  Food Insecurity:   . Worried About Charity fundraiser in the Last Year: Not on file  . Ran Out of Food in the Last Year: Not on file  Transportation Needs:   . Lack of Transportation (Medical): Not on file  . Lack of Transportation (Non-Medical): Not on file  Physical Activity:   . Days of Exercise per  Week: Not on file  . Minutes of Exercise per Session: Not on file  Stress:   . Feeling of Stress : Not on file  Social Connections:   . Frequency of Communication with Friends and Family: Not on file  . Frequency of Social Gatherings with Friends and Family: Not on file  . Attends Religious Services: Not on file  . Active Member of Clubs or Organizations: Not on file  . Attends Archivist Meetings: Not on file  . Marital Status: Not on file   Additional Social History:   He stated he was living at his brother's but left because some lady in the office saw him doing strange things. He stated he was at the [facility] on Saturday and the ambulance was called. He is currently living in a shelter and plans to go back to a shelter when he is discharged.  He has a history of cocaine and marijuana use and UDS was positive for both on admission.  Sleep: Poor  Appetite:  Fair  Current Medications: Current Facility-Administered Medications  Medication Dose Route Frequency Provider Last Rate Last Admin  . alum & mag hydroxide-simeth (MAALOX/MYLANTA) 200-200-20 MG/5ML suspension 30 mL  30  mL Oral Q4H PRN Merlyn Lot E, NP      . haloperidol lactate (HALDOL) injection 5 mg  5 mg Intramuscular Q8H PRN Merlyn Lot E, NP       And  . diphenhydrAMINE (BENADRYL) injection 25 mg  25 mg Intramuscular Q8H PRN Mallie Darting, NP       And  . LORazepam (ATIVAN) injection 1 mg  1 mg Intramuscular Q8H PRN Mallie Darting, NP      . Derrill Memo ON 11/25/2020] divalproex (DEPAKOTE ER) 24 hr tablet 500 mg  500 mg Oral QHS Lavella Hammock, MD      . divalproex (DEPAKOTE ER) 24 hr tablet 750 mg  750 mg Oral Daily Lavella Hammock, MD      . hydrOXYzine (ATARAX/VISTARIL) tablet 25 mg  25 mg Oral TID PRN Ileene Musa E, PA   25 mg at 11/23/20 2053  . magnesium hydroxide (MILK OF MAGNESIA) suspension 30 mL  30 mL Oral Daily PRN Mallie Darting, NP      . Derrill Memo ON 11/29/2020] paliperidone (INVEGA SUSTENNA) injection 156 mg  156 mg Intramuscular Once Lavella Hammock, MD      . Derrill Memo ON 12/27/2020] paliperidone (INVEGA SUSTENNA) injection 234 mg  234 mg Intramuscular Once Lavella Hammock, MD      . paliperidone (INVEGA) 24 hr tablet 6 mg  6 mg Oral QHS Ethelene Hal, NP   6 mg at 11/23/20 2053  . thiamine tablet 100 mg  100 mg Oral Daily Merlyn Lot E, NP   100 mg at 11/24/20 7564   Or  . thiamine (B-1) injection 100 mg  100 mg Intravenous Daily Mallie Darting, NP        Lab Results: No results found for this or any previous visit (from the past 48 hour(s)).  Blood Alcohol level:  Lab Results  Component Value Date   ETH 23 (H) 11/18/2020   ETH 82 (H) 33/29/5188    Metabolic Disorder Labs: Lab Results  Component Value Date   HGBA1C 5.1 11/02/2020   MPG 99.67 11/02/2020   MPG 85.32 05/30/2018   Lab Results  Component Value Date   PROLACTIN 32.5 (H) 05/30/2018   Lab Results  Component Value Date   CHOL 148  11/02/2020   TRIG 47 11/02/2020   HDL 71 11/02/2020   CHOLHDL 2.1 11/02/2020   VLDL 9 11/02/2020   LDLCALC 68 11/02/2020   LDLCALC 107 (H) 05/30/2018     Physical Findings: AIMS: Facial and Oral Movements Muscles of Facial Expression: None, normal Lips and Perioral Area: None, normal Jaw: None, normal Tongue: None, normal,Extremity Movements Upper (arms, wrists, hands, fingers): None, normal Lower (legs, knees, ankles, toes): None, normal, Trunk Movements Neck, shoulders, hips: None, normal, Overall Severity Severity of abnormal movements (highest score from questions above): None, normal Incapacitation due to abnormal movements: None, normal Patient's awareness of abnormal movements (rate only patient's report): No Awareness, Dental Status Current problems with teeth and/or dentures?: No Does patient usually wear dentures?: No  CIWA:  CIWA-Ar Total: 1 COWS:     Musculoskeletal: Strength & Muscle Tone: within normal limits Gait & Station: normal Patient leans: N/A  Psychiatric Specialty Exam: Physical Exam Vitals and nursing note reviewed.  Constitutional:      Appearance: Normal appearance.  HENT:     Head: Normocephalic and atraumatic.  Eyes:     Extraocular Movements: Extraocular movements intact.  Cardiovascular:     Rate and Rhythm: Tachycardia present.  Pulmonary:     Effort: Pulmonary effort is normal. No respiratory distress.  Musculoskeletal:        General: Normal range of motion.     Cervical back: Normal range of motion.  Neurological:     General: No focal deficit present.     Review of Systems  Constitutional: Negative for chills and diaphoresis.  HENT: Negative for congestion and sneezing.   Eyes: Negative for discharge.  Respiratory: Negative for chest tightness, shortness of breath and wheezing.   Cardiovascular: Negative for chest pain and palpitations.  Gastrointestinal: Negative for diarrhea, nausea and vomiting.  Psychiatric/Behavioral: Positive for confusion, dysphoric mood, hallucinations, sleep disturbance and suicidal ideas. Negative for agitation, behavioral problems, decreased  concentration and self-injury. The patient is nervous/anxious. The patient is not hyperactive.     Constitutional: Negative for activity change and appetite change.  Respiratory: Negative for chest tightness and shortness of breath.   Cardiovascular: Negative for chest pain.  Gastrointestinal: Negative for abdominal pain.  Neurological: Negative for facial asymmetry and headaches. .    Blood pressure 138/88, pulse (!) 122, temperature 98.2 F (36.8 C), temperature source Oral, resp. rate 16, height 5\' 8"  (1.727 m), weight 59 kg, SpO2 100 %.Body mass index is 19.77 kg/m.  General Appearance: Casual and Disheveled  Eye Contact:  Fair  Speech:  Slow  Volume:  Decreased  Mood:  Depressed and Hopeless  Affect:  Blunt, Congruent, Depressed and Flat  Thought Process:  Disorganized  Orientation:  Full (Time, Place, and Person)  Thought Content:  Illogical, Hallucinations: Auditory and Ideas of Reference:   Paranoia  Suicidal Thoughts:  Yes.  with intent/plan  Homicidal Thoughts:  No  Memory:  Immediate;   Fair Recent;   Fair Remote;   Fair  Judgement:  Poor  Insight:  Lacking  Psychomotor Activity:  Normal  Concentration:  Concentration: Poor and Attention Span: Fair  Recall:  [employer] of Knowledge:  Fair  Language:  Fair  Akathisia:  No  Handed:  Right  AIMS (if indicated):     Assets:  Physical Health Resilience  ADL's:  Impaired  Cognition:  WNL  Sleep:  Number of Hours: 2.25   Treatment Plan Summary: Daily contact with patient to assess and evaluate symptoms and progress  in treatment and Medication management.   Continue inpatient hospitalization. Will continue today 11/24/2020 plan as below:  Discontinued Zyprexa.  Discontinued gabapentin 300 mg 3 times daily on 11/24/2020 due to oversedation  Start Depakote ER 750 mg at bedtime on 11/24/2020 for 1 time loading dose. Start Depakote ER 500 mg daily at bedtime on 11/25/2020 for mood stabilization and to help improve  nighttime sedation and daytime alertness  Depakote trough level to be drawn prior to Depakote dose on 11/28/2020.  Continue Invega 6 mg PO QHS for mood control Invega Sustenna 234 mg IM given on 11/23/2020, then 156 mg 1 week later (Due on 11-29-20). Then [place] Sustenna 234 mg IM Q monthly for mood and psychosis control.   Continue Thiamine 100 mg PO for thiamine replacement.  Continue Haldol for agitation protocol  Encourage patient to participate in the therapeutic group milieu.   Discharge disposition in progress.   Lavella Hammock, MD 11/24/2020, 5:43 PMPatient ID: [name], male   DOB: 1996/09/26, 24 y.o.   MRN: [id number]

## 2020-11-24 NOTE — [facility] Group Notes (Signed)
LCSW Group Therapy Note  11/24/2020   1430  Type of Therapy and Topic:  Group Therapy: What's So Funny About Mental Illness?  Participation Level:  Did Not Attend   Description of Group:   In this group, patients learned how to recognize how they personally define their mental illness. This group also addressed stigma associated with mental illness.  Patients were asked to give examples of experiences surrounding living with a mental illness and how it makes them feel. Patients were asked to self-reflect on how they have chosen to address their mental health thus far and were invited to share those lessons or to discuss how stigma has affected their mental health care. Patients actively explore how their mental illness has impacted decisions and actions as well as how future decisions can be impacted.  Therapeutic Goals: 1. Patients will identify what can be considered mental illness 2. Patients will identify lessons learned from past experiences and how they can be applied to future struggles. 3. Patients will establish rapport with peers in a therapeutic setting.  Summary of Patient Progress:  Aneudy did not attend group.   Therapeutic Modalities:   Cognitive Behavioral Therapy    Darleen Crocker, LCSW 11/24/2020  1:57 PM

## 2020-11-25 DIAGNOSIS — F1414 Cocaine abuse with cocaine-induced mood disorder: Secondary | ICD-10-CM | POA: Diagnosis not present

## 2020-11-25 DIAGNOSIS — F1410 Cocaine abuse, uncomplicated: Secondary | ICD-10-CM | POA: Diagnosis not present

## 2020-11-25 DIAGNOSIS — R451 Restlessness and agitation: Secondary | ICD-10-CM

## 2020-11-25 DIAGNOSIS — F200 Paranoid schizophrenia: Secondary | ICD-10-CM | POA: Diagnosis not present

## 2020-11-25 DIAGNOSIS — F12150 Cannabis abuse with psychotic disorder with delusions: Secondary | ICD-10-CM | POA: Diagnosis not present

## 2020-11-25 MED ORDER — GABAPENTIN 300 MG PO CAPS: 300.0000 mg | ORAL_CAPSULE | Freq: Three times a day (TID) | ORAL | Status: DC

## 2020-11-25 MED ORDER — GABAPENTIN 300 MG PO CAPS: 300.0000 mg | ORAL_CAPSULE | Freq: Every day | ORAL | Status: DC

## 2020-11-25 NOTE — Progress Notes (Signed)
Patient presents with a flat affect and depressed mood. He appears withdrawn and is isolative to his room. He was encouraged to come out of his room and attend groups and go to the dayroom for activities. He endorses AVH, but would not elaborate. He reports feeling anxious and requested Vistaril for anxiety. He denies SI/HI.   Orders reviewed. Vital signs reviewed. Verbal support provided. 15 minute checks performed for safety. Patient encouraged to attend groups.   Patient compliant with taking medications and denies any side effects.

## 2020-11-25 NOTE — Progress Notes (Addendum)
Pt was too sedated to receive HS meds, pt woke up at 4 am requesting night medications. Pt stated he did not get any sleep last night, but pt was sedated last night

## 2020-11-25 NOTE — Progress Notes (Signed)
[facility] MD Progress Note  11/25/2020 7:52 PM Isa Kohlenberg  MRN:  [id number]  HPI: Tyler White is a 24 y.o. male with a history of schizophrenia who has been living in a shelter and was sent to the emergency room for evaluation due to stating he was suicidal with a plan to overdose on some pills that he had found.Patient has been diagnosed with schizophrenia in the past and has been non-compliant with treatment and medications for the past year. He was seen the first of November in the ED because of bizarre behaviors and psychosis, but was medicated, but not admitted to [facility]. He has since been non-compliant with medications. Patient states that he has attempted suicide on one occasion in the past by cutting. He states that he has not cut in a long time. Patient states that his last hospitalization was a year ago at [facility]. He has no current outpatient provider. Patient was recently staying with his brother, but is now living in the shelter with minimal support. Patient states that he is not used to living in a shelter and it is anxiety provoking for him.    Patient is seen chart is reviewed.  Nurses notes show that patient was sedated and was not awakened for at bedtime medications.  He did awaken at 4 AM at which time he was dispensed his Depakote ER.  Subjective: Patient appears restless in bed today.  He reports that he got poor sleep last night and feels more anxious today.  Patient affect is flat and mood is depressed.  He states he was not able to fall asleep until 4 AM after taking Depakote, and request having his gabapentin restarted.  He states that he has been having increased anxiety and needing to move his legs.  He states that, "they told me to do that."  Patient continues to be responding to internal stimuli and endorses auditory and visual hallucinations.  Patient did attend groups with minimal participation.  He did attend recreational therapy in the gym where he was seen  to play football with a peer.  He is denying active suicidal ideation, but continues to endorse thoughts of wishing he were dead.  He denies homicidal ideation.   11/24/2020:  Continues to endorse auditory hallucinations, but states they are getting better. He describes that he is, "feeling down." He continues to have suicidal thoughts, and a plan to overdose on XTC. When asked where he would get these pills, he states that he "sells self out"." On further questioning, patient endorses exchanging sexual acts for drugs or money. He reports he uses XTC, cocaine, weed, alcohol and tobacco. He reports that he has been staying in the [place] house/shelter, but reports not feeling comfortable there. He has given consent for [place] to speak with his mother (lives in [place]), however the phone contact for mother is not accurate. He also gives consent for [place] to speak with his father Edmonia Caprio [phone] in [place]). Patient states that he has not talked to his parents in approximately 2 months patient describes having been diagnosed with schizoaffective disorder and previously went to the [facility]. He has not continued in treatment. Patient states that he has not been able to have a job since he graduated from [employer]. He does state that he is willing to work. He talks about his 5 siblings, 3 sisters and 2 brothers (oldest sister 26 lives in [place], 57 year old brother lives in [place], 64 year old brother lives in  [place], 2 sisters ages 2, 32 years).  Patient sees an old picture of himself, and states that he likes that [place] better than the person he is now.  He states that he would like to stop using drugs, and live a better lifestyle.  During this encounter, he has shown a video from a sober living environment.  Contact is made with that facility to see if he could be accepted as a resident.  However due to his schizophrenia/dual diagnosis he is  not eligible at this time.  Patient is agreeable to going to a shelter, but wishes to discharge to a shelter that can provide substance abuse services.  Patient's thoughts are slow and concrete during visit.  There are times where he has thought blocking.  He continues to endorse auditory hallucinations, but cannot make out what they are saying currently.  He reports previously they were very negative.  He is denying visual hallucinations today.  He is denying any active suicidal ideation, but continues to have concern for his safety.  He denies homicidal ideation.  He states that he has been feeling tired, and it has been difficult for him to attend groups.  He describes that he has had difficulty sleeping.  His appetite is fair.  On reevaluation with social worker to get consent for calling family, patient continues to appear extremely sedated. He continues to be guarded and has a constricted affect.   Objective: Patient is medication compliant and has had no behavioral issues on the unit. Sleep overnight was poor, nursing reports 2.25 hours.  He is eating with fair appetite.  He has not been attending groups. Labs reviewed.  Urine drug screen positive for cocaine on UDS on this admission previously had been positive for tetrahydrocannabinol and cocaine (last 2 years ago). He was on Invega LAI and stated he had his last injection in March or April. He is agreeable to starting back on [place] and received [place] 234 mg on 11/23/2020. He is scheduled for Invega Sustenna 156 mg on 11/29/2020.  Principal Problem: Schizophrenia ([place]) Diagnosis: Principal Problem:   Schizophrenia ([place]) Active Problems:   Cannabis abuse with psychotic disorder, with delusions ([place])   Psychosis ([place])   Cocaine abuse ([place])   Cocaine abuse with cocaine-induced mood disorder ([place])  Total Time spent with patient: 65 minutes  Past Psychiatric History: See H&P  Past Medical History:  Past Medical History:   Diagnosis Date  . Hernia, inguinal, right   . Inguinal hernia    right  . Psychiatric illness   . Schizophrenia ([place])    History reviewed. No pertinent surgical history. Family History:  Family History  Problem Relation Age of Onset  . Psychiatric Illness Mother   . Hypertension Sister    Family Psychiatric  History: See H&P  Social History:  Social History   Substance and Sexual Activity  Alcohol Use Yes     Social History   Substance and Sexual Activity  Drug Use Yes  . Types: Marijuana    Social History   Socioeconomic History  . Marital status: Single    Spouse name: Not on file  . Number of children: Not on file  . Years of education: Not on file  . Highest education level: Not on file  Occupational History  . Not on file  Tobacco Use  . Smoking status: Current Every Day Smoker    Packs/day: 0.50    Years: 5.00    Pack years: 2.50  Types: Cigarettes  . Smokeless tobacco: Never Used  Vaping Use  . Vaping Use: Never used  Substance and Sexual Activity  . Alcohol use: Yes  . Drug use: Yes    Types: Marijuana  . Sexual activity: Yes    Birth control/protection: None  Other Topics Concern  . Not on file  Social History Narrative   ** Merged History Encounter **       Social Determinants of Health   Financial Resource Strain:   . Difficulty of Paying Living Expenses: Not on file  Food Insecurity:   . Worried About Charity fundraiser in the Last Year: Not on file  . Ran Out of Food in the Last Year: Not on file  Transportation Needs:   . Lack of Transportation (Medical): Not on file  . Lack of Transportation (Non-Medical): Not on file  Physical Activity:   . Days of Exercise per Week: Not on file  . Minutes of Exercise per Session: Not on file  Stress:   . Feeling of Stress : Not on file  Social Connections:   . Frequency of Communication with Friends and Family: Not on file  . Frequency of Social Gatherings with Friends and Family: Not on  file  . Attends Religious Services: Not on file  . Active Member of Clubs or Organizations: Not on file  . Attends Archivist Meetings: Not on file  . Marital Status: Not on file   Additional Social History:   He stated he was living at his brother's but left because some lady in the office saw him doing strange things. He stated he was at the [facility] on Saturday and the ambulance was called. He is currently living in a shelter and plans to go back to a shelter when he is discharged.  He has a history of cocaine and marijuana use and UDS was positive for both on admission.  Sleep: Poor  Appetite:  Fair  Current Medications: Current Facility-Administered Medications  Medication Dose Route Frequency Provider Last Rate Last Admin  . alum & mag hydroxide-simeth (MAALOX/MYLANTA) 200-200-20 MG/5ML suspension 30 mL  30 mL Oral Q4H PRN Merlyn Lot E, NP      . haloperidol lactate (HALDOL) injection 5 mg  5 mg Intramuscular Q8H PRN Merlyn Lot E, NP       And  . diphenhydrAMINE (BENADRYL) injection 25 mg  25 mg Intramuscular Q8H PRN Mallie Darting, NP       And  . LORazepam (ATIVAN) injection 1 mg  1 mg Intramuscular Q8H PRN Merlyn Lot E, NP      . divalproex (DEPAKOTE ER) 24 hr tablet 500 mg  500 mg Oral QHS Lavella Hammock, MD      . gabapentin (NEURONTIN) capsule 300 mg  300 mg Oral TID Lavella Hammock, MD      . hydrOXYzine (ATARAX/VISTARIL) tablet 25 mg  25 mg Oral TID PRN Ileene Musa E, PA   25 mg at 11/25/20 1159  . magnesium hydroxide (MILK OF MAGNESIA) suspension 30 mL  30 mL Oral Daily PRN Mallie Darting, NP      . Derrill Memo ON 11/29/2020] paliperidone (INVEGA SUSTENNA) injection 156 mg  156 mg Intramuscular Once Lavella Hammock, MD      . Derrill Memo ON 12/27/2020] paliperidone (INVEGA SUSTENNA) injection 234 mg  234 mg Intramuscular Once Lavella Hammock, MD      . paliperidone (INVEGA) 24 hr tablet 6 mg  6 mg Oral  QHS Ethelene Hal, NP   6 mg at 11/25/20 0422  .  thiamine tablet 100 mg  100 mg Oral Daily Merlyn Lot E, NP   100 mg at 11/25/20 0759   Or  . thiamine (B-1) injection 100 mg  100 mg Intravenous Daily Mallie Darting, NP        Lab Results: No results found for this or any previous visit (from the past 48 hour(s)).  Blood Alcohol level:  Lab Results  Component Value Date   ETH 23 (H) 11/18/2020   ETH 82 (H) 64/40/3474    Metabolic Disorder Labs: Lab Results  Component Value Date   HGBA1C 5.1 11/02/2020   MPG 99.67 11/02/2020   MPG 85.32 05/30/2018   Lab Results  Component Value Date   PROLACTIN 32.5 (H) 05/30/2018   Lab Results  Component Value Date   CHOL 148 11/02/2020   TRIG 47 11/02/2020   HDL 71 11/02/2020   CHOLHDL 2.1 11/02/2020   VLDL 9 11/02/2020   LDLCALC 68 11/02/2020   LDLCALC 107 (H) 05/30/2018    Physical Findings: AIMS: Facial and Oral Movements Muscles of Facial Expression: None, normal Lips and Perioral Area: None, normal Jaw: None, normal Tongue: None, normal,Extremity Movements Upper (arms, wrists, hands, fingers): None, normal Lower (legs, knees, ankles, toes): None, normal, Trunk Movements Neck, shoulders, hips: None, normal, Overall Severity Severity of abnormal movements (highest score from questions above): None, normal Incapacitation due to abnormal movements: None, normal Patient's awareness of abnormal movements (rate only patient's report): No Awareness, Dental Status Current problems with teeth and/or dentures?: No Does patient usually wear dentures?: No  CIWA:  CIWA-Ar Total: 1 COWS:     Musculoskeletal: Strength & Muscle Tone: within normal limits Gait & Station: normal Patient leans: N/A  Psychiatric Specialty Exam: Physical Exam Vitals and nursing note reviewed.  Constitutional:      Appearance: Normal appearance.  HENT:     Head: Normocephalic and atraumatic.  Eyes:     Extraocular Movements: Extraocular movements intact.  Cardiovascular:     Rate and Rhythm:  Tachycardia present.  Pulmonary:     Effort: Pulmonary effort is normal. No respiratory distress.  Musculoskeletal:        General: Normal range of motion.     Cervical back: Normal range of motion.  Neurological:     General: No focal deficit present.     Review of Systems  Constitutional: Negative for chills and diaphoresis.  HENT: Negative for congestion and sneezing.   Eyes: Negative for discharge.  Respiratory: Negative for chest tightness, shortness of breath and wheezing.   Cardiovascular: Negative for chest pain and palpitations.  Gastrointestinal: Negative for diarrhea, nausea and vomiting.  Psychiatric/Behavioral: Positive for confusion, dysphoric mood, hallucinations, sleep disturbance and suicidal ideas. Negative for agitation, behavioral problems, decreased concentration and self-injury. The patient is nervous/anxious. The patient is not hyperactive.     Constitutional: Negative for activity change and appetite change.  Respiratory: Negative for chest tightness and shortness of breath.   Cardiovascular: Negative for chest pain.  Gastrointestinal: Negative for abdominal pain.  Neurological: Negative for facial asymmetry and headaches. .    Blood pressure 118/69, pulse 92, temperature 98.4 F (36.9 C), temperature source Oral, resp. rate 16, height 5\' 8"  (1.727 m), weight 59 kg, SpO2 100 %.Body mass index is 19.77 kg/m.  General Appearance: Casual and Disheveled  Eye Contact:  Fair  Speech:  Slow  Volume:  Decreased  Mood:  Depressed and Hopeless  Affect:  Blunt, Congruent, Depressed and Flat  Thought Process:  Disorganized  Orientation:  Full (Time, Place, and Person)  Thought Content:  Illogical, Hallucinations: Auditory and Ideas of Reference:   Paranoia  Suicidal Thoughts:  Yes.  with intent/plan  Homicidal Thoughts:  No  Memory:  Immediate;   Fair Recent;   Fair Remote;   Fair  Judgement:  Poor  Insight:  Lacking  Psychomotor Activity:  Normal   Concentration:  Concentration: Poor and Attention Span: Fair  Recall:  [employer] of Knowledge:  Fair  Language:  Fair  Akathisia:  No  Handed:  Right  AIMS (if indicated):     Assets:  Physical Health Resilience  ADL's:  Impaired  Cognition:  WNL  Sleep:  Number of Hours: 2.25   Treatment Plan Summary: Daily contact with patient to assess and evaluate symptoms and progress in treatment and Medication management.   Continue inpatient hospitalization. Will continue today 11/25/2020 plan as below:  Discontinued Zyprexa.  Discontinued gabapentin 300 mg 3 times daily on 11/24/2020 due to oversedation  Start Depakote ER 750 mg at bedtime on 11/24/2020 for 1 time loading dose. Start Depakote ER 500 mg daily at bedtime on 11/25/2020 for mood stabilization and to help improve nighttime sedation and daytime alertness  We will monitor for increasing anxiety or restlessness with legs while awake.  Gabapentin could be reordered if needed.  Will add an iron profile to next lab draw to assess for ferritin levels, and recommend iron supplementation if ferritin level is less than 50.  Depakote trough level to be drawn prior to Depakote dose on 11/28/2020.  Continue Invega 6 mg PO QHS for mood control Invega Sustenna 234 mg IM given on 11/23/2020, then 156 mg 1 week later (Due on 11-29-20). Then [place] Sustenna 234 mg IM Q monthly for mood and psychosis control.   Continue Thiamine 100 mg PO for thiamine replacement.  Continue Haldol for agitation protocol  Encourage patient to participate in the therapeutic group milieu.   Discharge disposition in progress.   Lavella Hammock, MD 11/25/2020, 7:52 PMPatient ID: [name], male   DOB: [birth year], 24 y.o.   MRN: [id number]

## 2020-11-25 NOTE — [facility] Group Notes (Signed)
LCSW Group Therapy Note  11/25/2020    10:00-11:00am   Type of Therapy and Topic:  Group Therapy: Early Messages Received About Anger  Participation Level:  Minimal   Description of Group:   In this group, patients shared and discussed the early messages received in their lives about anger through parental or other adult modeling, teaching, repression, punishment, violence, and more.  Participants identified how those childhood lessons influence even now how they usually or often react when angered.  The group discussed that anger is a secondary emotion and what may be the underlying emotional themes that come out through anger outbursts or that are ignored through anger suppression.    Therapeutic Goals: 1. Patients will identify one or more childhood message about anger that they received and how it was taught to them. 2. Patients will discuss how these childhood experiences have influenced and continue to influence their own expression or repression of anger even today. 3. Patients will explore possible primary emotions that tend to fuel their secondary emotion of anger. 4. Patients will learn that anger itself is normal and cannot be eliminated, and that healthier coping skills can assist with resolving conflict rather than worsening situations.  Summary of Patient Progress:  The patient shared that his childhood lessons about anger were that he was not allowed to show anger.  As a result, he still does not want to show anger.  The patient participated very minimally and only when called on directly.  Therapeutic Modalities:   Cognitive Behavioral Therapy Motivation Interviewing  Maretta Los  .

## 2020-11-26 DIAGNOSIS — F12150 Cannabis abuse with psychotic disorder with delusions: Secondary | ICD-10-CM | POA: Diagnosis not present

## 2020-11-26 DIAGNOSIS — F200 Paranoid schizophrenia: Secondary | ICD-10-CM | POA: Diagnosis not present

## 2020-11-26 DIAGNOSIS — F1414 Cocaine abuse with cocaine-induced mood disorder: Secondary | ICD-10-CM | POA: Diagnosis not present

## 2020-11-26 DIAGNOSIS — F1410 Cocaine abuse, uncomplicated: Secondary | ICD-10-CM | POA: Diagnosis not present

## 2020-11-26 MED ORDER — RISPERIDONE 2 MG PO TABS
2.0000 mg | ORAL_TABLET | ORAL | Status: DC
  Administered 2020-11-26 – 2020-11-27 (×4): 2 mg via ORAL
  Filled 2020-11-26 (×10): qty 1

## 2020-11-26 MED ORDER — GABAPENTIN 300 MG PO CAPS
300.0000 mg | ORAL_CAPSULE | Freq: Three times a day (TID) | ORAL | Status: DC
  Administered 2020-11-26 – 2020-11-27 (×3): 300 mg via ORAL
  Filled 2020-11-26 (×8): qty 1

## 2020-11-26 NOTE — Progress Notes (Signed)
[facility] MD Progress Note  11/26/2020 8:56 AM Tyler White  MRN:  [id number]  HPI: Tyler White is a 24 y.o. male with a history of schizophrenia who has been living in a shelter and was sent to the emergency room for evaluation due to stating he was suicidal with a plan to overdose on some pills that he had found.Patient has been diagnosed with schizophrenia in the past and has been non-compliant with treatment and medications for the past year. He was seen the first of November in the ED because of bizarre behaviors and psychosis, but was medicated, but not admitted to [facility]. He has since been non-compliant with medications. Patient states that he has attempted suicide on one occasion in the past by cutting. He states that he has not cut in a long time. Patient states that his last hospitalization was a year ago at [facility]. He has no current outpatient provider. Patient was recently staying with his brother, but is now living in the shelter with minimal support. Patient states that he is not used to living in a shelter and it is anxiety provoking for him.    Patient is seen chart is reviewed.  Sleep is significantly improved. Nurses report 8.75 hours sleep.  This morning, however patient states that he is unable to sit still and is shaking his legs.  Subjective: Patient is seen and appears restless.  He continues to report poor sleep despite improved document sleep time.  He continues to complain of a restlessness and a need to move his legs.  Patient endorses continued auditory hallucinations.  He is unable to verbalize what he is hearing, and has thought blocking while responding to internal stimuli.  He is depressed with guarded affect this morning.  He expresses feeling anxious, and states, I can just go back to [place].  Patient attended progressive muscle relaxation group and recreational therapy, but did not attend other group. He continues to endorse thoughts of wishing he were  dead, and has passive suicidal ideation.  He denies homicidal ideation.   Objective: Patient is medication compliant and has had no behavioral issues on the unit. Sleep overnight was improved, nursing reports 8.75 hours.  He is eating with fair appetite.  He has not been attending groups with limited participation. Labs reviewed.  Urine drug screen positive for cocaine on UDS on this admission previously had been positive for tetrahydrocannabinol and cocaine (last 2 years ago). He was on Invega LAI and stated he had his last injection in March or April. He is agreeable to starting back on [place] and received [place] 234 mg on 11/23/2020. He is scheduled for Invega Sustenna 156 mg on 11/29/2020.  Principal Problem: Schizophrenia ([place]) Diagnosis: Principal Problem:   Schizophrenia ([place]) Active Problems:   Cannabis abuse with psychotic disorder, with delusions ([place])   Psychosis ([place])   Cocaine abuse ([place])   Cocaine abuse with cocaine-induced mood disorder ([place])  Total Time spent with patient: 35 minutes  Past Psychiatric History: See H&P-  diagnosed with schizoaffective disorder and previously went to the [place]. He has not continued in treatment.   Past Medical History:  Past Medical History:  Diagnosis Date  . Hernia, inguinal, right   . Inguinal hernia    right  . Psychiatric illness   . Schizophrenia ([place])    History reviewed. No pertinent surgical history. Family History:  Family History  Problem Relation Age of Onset  . Psychiatric Illness Mother   . Hypertension  Sister    Family Psychiatric  History: See H&P  Social History:  Social History   Substance and Sexual Activity  Alcohol Use Yes     Social History   Substance and Sexual Activity  Drug Use Yes  . Types: Marijuana    Social History   Socioeconomic History  . Marital status: Single    Spouse name: Not on file  . Number of children: Not on file  . Years of education: Not on  file  . Highest education level: Not on file  Occupational History  . Not on file  Tobacco Use  . Smoking status: Current Every Day Smoker    Packs/day: 0.50    Years: 5.00    Pack years: 2.50    Types: Cigarettes  . Smokeless tobacco: Never Used  Vaping Use  . Vaping Use: Never used  Substance and Sexual Activity  . Alcohol use: Yes  . Drug use: Yes    Types: Marijuana  . Sexual activity: Yes    Birth control/protection: None  Other Topics Concern  . Not on file  Social History Narrative   ** Merged History Encounter **       Social Determinants of Health   Financial Resource Strain:   . Difficulty of Paying Living Expenses: Not on file  Food Insecurity:   . Worried About Charity fundraiser in the Last Year: Not on file  . Ran Out of Food in the Last Year: Not on file  Transportation Needs:   . Lack of Transportation (Medical): Not on file  . Lack of Transportation (Non-Medical): Not on file  Physical Activity:   . Days of Exercise per Week: Not on file  . Minutes of Exercise per Session: Not on file  Stress:   . Feeling of Stress : Not on file  Social Connections:   . Frequency of Communication with Friends and Family: Not on file  . Frequency of Social Gatherings with Friends and Family: Not on file  . Attends Religious Services: Not on file  . Active Member of Clubs or Organizations: Not on file  . Attends Archivist Meetings: Not on file  . Marital Status: Not on file   Additional Social History:   He stated he was living at his brother's but left because some lady in the office saw him doing strange things. He stated he was at the [facility] on Saturday and the ambulance was called. He is currently living in a shelter and plans to go back to a shelter when he is discharged.  He has a history of cocaine and marijuana use and UDS was positive for both on admission.  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   He reports that he has been staying in the [facility] house/shelter, but reports not feeling comfortable there. He has given consent for [place] to speak with his mother (lives in [place]), however the phone contact for mother is not accurate. He also gives consent for [place] to speak with his father Edmonia Caprio [phone] in [place]). Patient states that he has not talked to his parents in approximately 2 months    Patient states that he has not been able to have a job since he graduated from [employer]. He does state that he is willing to work. He talks about his 5 siblings, 3 sisters and 2 brothers (  oldest sister 14 lives in [place], 58 year old brother lives in [place], 13 year old brother lives in [place], 2 sisters ages 67, 3 years).  Sleep: improving  Appetite:  Fair  Current Medications: Current Facility-Administered Medications  Medication Dose Route Frequency Provider Last Rate Last Admin  . alum & mag hydroxide-simeth (MAALOX/MYLANTA) 200-200-20 MG/5ML suspension 30 mL  30 mL Oral Q4H PRN Merlyn Lot E, NP      . haloperidol lactate (HALDOL) injection 5 mg  5 mg Intramuscular Q8H PRN Merlyn Lot E, NP       And  . diphenhydrAMINE (BENADRYL) injection 25 mg  25 mg Intramuscular Q8H PRN Mallie Darting, NP       And  . LORazepam (ATIVAN) injection 1 mg  1 mg Intramuscular Q8H PRN Merlyn Lot E, NP      . divalproex (DEPAKOTE ER) 24 hr tablet 500 mg  500 mg Oral QHS Lavella Hammock, MD      . gabapentin (NEURONTIN) capsule 300 mg  300 mg Oral TID Lavella Hammock, MD      . hydrOXYzine (ATARAX/VISTARIL) tablet 25 mg  25 mg Oral TID PRN Ileene Musa E, PA   25 mg at 11/25/20 1159  . magnesium hydroxide (MILK OF MAGNESIA) suspension 30 mL  30 mL Oral Daily PRN Mallie Darting, NP      . Derrill Memo ON 11/29/2020] paliperidone (INVEGA SUSTENNA) injection 156 mg  156 mg Intramuscular Once  Lavella Hammock, MD      . Derrill Memo ON 12/27/2020] paliperidone (INVEGA SUSTENNA) injection 234 mg  234 mg Intramuscular Once Lavella Hammock, MD      . paliperidone (INVEGA) 24 hr tablet 6 mg  6 mg Oral QHS Ethelene Hal, NP   6 mg at 11/25/20 0422  . thiamine tablet 100 mg  100 mg Oral Daily Merlyn Lot E, NP   100 mg at 11/25/20 0759   Or  . thiamine (B-1) injection 100 mg  100 mg Intravenous Daily Mallie Darting, NP        Lab Results: No results found for this or any previous visit (from the past 48 hour(s)).  Blood Alcohol level:  Lab Results  Component Value Date   ETH 23 (H) 11/18/2020   ETH 82 (H) 85/63/1497    Metabolic Disorder Labs: Lab Results  Component Value Date   HGBA1C 5.1 11/02/2020   MPG 99.67 11/02/2020   MPG 85.32 05/30/2018   Lab Results  Component Value Date   PROLACTIN 32.5 (H) 05/30/2018   Lab Results  Component Value Date   CHOL 148 11/02/2020   TRIG 47 11/02/2020   HDL 71 11/02/2020   CHOLHDL 2.1 11/02/2020   VLDL 9 11/02/2020   LDLCALC 68 11/02/2020   LDLCALC 107 (H) 05/30/2018    Physical Findings: AIMS: Facial and Oral Movements Muscles of Facial Expression: None, normal Lips and Perioral Area: None, normal Jaw: None, normal Tongue: None, normal,Extremity Movements Upper (arms, wrists, hands, fingers): None, normal Lower (legs, knees, ankles, toes): None, normal, Trunk Movements Neck, shoulders, hips: None, normal, Overall Severity Severity of abnormal movements (highest score from questions above): None, normal Incapacitation due to abnormal movements: None, normal Patient's awareness of abnormal movements (rate only patient's report): No Awareness, Dental Status Current problems with teeth and/or dentures?: No Does patient usually wear dentures?: No  CIWA:  CIWA-Ar Total: 1 COWS:     Musculoskeletal: Strength & Muscle Tone: within normal limits Gait & Station: normal Patient  leans: N/A  Psychiatric Specialty  Exam: Physical Exam Vitals and nursing note reviewed.  Constitutional:      Appearance: Normal appearance.  HENT:     Head: Normocephalic.     Comments: Scars along bridge of nose Eyes:     Extraocular Movements: Extraocular movements intact.  Cardiovascular:     Rate and Rhythm: Normal rate.  Pulmonary:     Effort: Pulmonary effort is normal. No respiratory distress.  Musculoskeletal:        General: Normal range of motion.     Cervical back: Normal range of motion.  Neurological:     General: No focal deficit present.     Review of Systems  Constitutional: Negative for chills and diaphoresis.  HENT: Negative for congestion and sneezing.   Eyes: Negative for discharge.  Respiratory: Negative for chest tightness, shortness of breath and wheezing.   Cardiovascular: Negative for chest pain and palpitations.  Gastrointestinal: Negative for diarrhea, nausea and vomiting.  Psychiatric/Behavioral: Positive for confusion, dysphoric mood, hallucinations and suicidal ideas. Negative for agitation, behavioral problems, decreased concentration, self-injury and sleep disturbance (improving). The patient is nervous/anxious. The patient is not hyperactive.     Constitutional: Negative for activity change and appetite change.  Respiratory: Negative for chest tightness and shortness of breath.   Cardiovascular: Negative for chest pain.  Gastrointestinal: Negative for abdominal pain.  Neurological: Negative for facial asymmetry and headaches. .    Blood pressure (!) 140/91, pulse 96, temperature 98.1 F (36.7 C), temperature source Oral, resp. rate 16, height 5\' 8"  (1.727 m), weight 59 kg, SpO2 100 %.Body mass index is 19.77 kg/m.  General Appearance: Casual and Disheveled  Eye Contact:  Fair  Speech:  Slow with thought blocking  Volume:  Decreased  Mood:  Depressed and Hopeless  Affect:  Congruent, Depressed and Flat  Thought Process:  Disorganized  Orientation:  Full (Time, Place, and  Person)  Thought Content:  Illogical, Hallucinations: Auditory and Ideas of Reference:   Paranoia  Suicidal Thoughts:  Yes.  without intent/plan, perseverative thoughts about being dead  Homicidal Thoughts:  No  Memory:  Immediate;   Fair Recent;   Fair Remote;   Fair  Judgement:  Poor  Insight:  Lacking  Psychomotor Activity:  Normal  Concentration:  Concentration: Poor and Attention Span: Fair  Recall:  [employer] of Knowledge:  Fair  Language:  Fair  Akathisia:  No  Handed:  Right  AIMS (if indicated):     Assets:  Physical Health Resilience  ADL's:  Impaired  Cognition:  WNL  Sleep:  Number of Hours: 8.75   Treatment Plan Summary: Daily contact with patient to assess and evaluate symptoms and progress in treatment and Medication management.   Continue inpatient hospitalization. Will continue today 11/26/2020 plan as below:  Restart gabapentin 300 mg 3 times daily for restlessness and mood stabilization  Completed: Depakote ER 750 mg at bedtime on 11/24/2020 for 1 time loading dose.  Continue Depakote ER 500 mg daily at bedtime on 11/25/2020 for mood stabilization and to help improve nighttime sedation and daytime alertness  Discontinue Invega 6 mg PO QHS for psychosis due to persistent hallucinations. Start Risperdal 2 mg BID for psychosis on 11/26/2020  Lorayne Bender Sustenna 234 mg IM given on 11/23/2020, then 156 mg 1 week later (Due on 11-29-20). Then [place] Sustenna 234 mg IM Q monthly for mood and psychosis control.    Continue hydroxyzine 25 mg 3 times daily as needed for anxiety  Continue  Thiamine 100 mg PO for thiamine replacement. Start Ferrous sulfate 325 mg daily for repletion  Continue Haldol for agitation protocol  Depakote trough level to be drawn prior to Depakote dose on 11/28/2020. Iron profile to next lab draw to assess for ferritin levels, and recommend iron supplementation if ferritin level is less than 50. Encourage patient to participate in the  therapeutic group milieu.   Discharge disposition in progress.   Patient would benefit from a rescue mission shelter that has a program for substance use as well as assisting with employment opportunities.  Patient could also benefit from a group home if he is willing.  Patient has signed consent for collateral from parents.  Appreciate social work's assistance in contacting family prior to discharge.   Lavella Hammock, MD 11/26/2020, 8:56 AMPatient ID: [name], male   DOB: [birth year], 24 y.o.   MRN: [id number]

## 2020-11-26 NOTE — Progress Notes (Signed)
Adult Psychoeducational Group Note  Date:  11/26/2020 Time:  9:01 PM  Group Topic/Focus:  Wrap-Up Group:   The focus of this group is to help patients review their daily goal of treatment and discuss progress on daily workbooks.  Participation Level:  Did Not Attend  Participation Quality:  Did Not Attend  Affect:  Did Not Attend  Cognitive:  Did Not Attend  Insight: None  Engagement in Group:  Did Not Attend  Modes of Intervention:  Did Not Attend  Additional Comments:  Pt did not attend evening wrap up group tonight.  Candy Sledge 11/26/2020, 9:01 PM

## 2020-11-26 NOTE — Progress Notes (Signed)
[place] NOVEL CORONAVIRUS (COVID-19) DAILY CHECK-OFF SYMPTOMS - answer yes or no to each - every day NO YES  Have you had a fever in the past 24 hours?  . Fever (Temp > 37.80C / 100F) X   Have you had any of these symptoms in the past 24 hours? . New Cough .  Sore Throat  .  Shortness of Breath .  Difficulty Breathing .  Unexplained Body Aches   X   Have you had any one of these symptoms in the past 24 hours not related to allergies?   . Runny Nose .  Nasal Congestion .  Sneezing   X   If you have had runny nose, nasal congestion, sneezing in the past 24 hours, has it worsened?  X   EXPOSURES - check yes or no X   Have you traveled outside the state in the past 14 days?  X   Have you been in contact with someone with a confirmed diagnosis of COVID-19 or PUI in the past 14 days without wearing appropriate PPE?  X   Have you been living in the same home as a person with confirmed diagnosis of COVID-19 or a PUI (household contact)?    X   Have you been diagnosed with COVID-19?    X              What to do next: Answered NO to all: Answered YES to anything:   Proceed with unit schedule Follow the [facility] Inpatient Flowsheet.   

## 2020-11-26 NOTE — [facility] Group Notes (Signed)
[place] Group Notes: (Clinical Social Work)   11/26/2020      Type of Therapy:  Group Therapy   Participation Level:  Did Not Attend - was invited both individually by [facility] and by overhead announcement, chose not to attend.   Selmer Dominion, LCSW 11/26/2020, 12:51 PM

## 2020-11-26 NOTE — Progress Notes (Signed)
   11/26/20 1100  Psych Admission Type (Psych Patients Only)  Admission Status Voluntary  Psychosocial Assessment  Patient Complaints None  Eye Contact Brief  Facial Expression Flat  Affect Blunted;Preoccupied  Speech Slow;Soft  Interaction Avoidant;Forwards little;Guarded;Minimal  Motor Activity Fidgety  Appearance/Hygiene Body odor;Disheveled;Poor hygiene  Behavior Characteristics Cooperative  Mood Depressed;Anxious  Aggressive Behavior  Effect No apparent injury  Thought Process  Coherency Blocking  Content Preoccupation  Delusions None reported or observed  Perception Hallucinations  Hallucination Auditory;Visual  Judgment Limited  Confusion None  Danger to Self  Current suicidal ideation? Denies  Danger to Others  Danger to Others None reported or observed

## 2020-11-26 NOTE — Progress Notes (Signed)
   11/26/20 0300  Psych Admission Type (Psych Patients Only)  Admission Status Voluntary  Psychosocial Assessment  Patient Complaints Suspiciousness  Eye Contact Brief  Facial Expression Flat  Affect Blunted;Preoccupied  Speech Slow;Soft  Interaction Avoidant;Forwards little;Guarded;Minimal  Motor Activity Fidgety  Appearance/Hygiene Body odor;Disheveled;Poor hygiene  Behavior Characteristics Cooperative  Mood Depressed  Thought Process  Coherency Blocking  Content Preoccupation  Delusions None reported or observed  Perception Hallucinations  Hallucination Auditory;Visual  Judgment Limited  Confusion None  Danger to Self  Current suicidal ideation? Denies  Danger to Others  Danger to Others None reported or observed

## 2020-11-26 NOTE — [facility] Group Notes (Signed)
Adult Psychoeducational Group Not Date:  11/26/2020 Time:  0900-1045 Group Topic/Focus: PROGRESSIVE RELAXATION. A group where deep breathing is taught and tensing and relaxation muscle groups is used. Imagery is used as well.  Pts are asked to imagine 3 pillars that hold them up when they are not able to hold themselves up.  Participation Level:  Listened and did attend Participation Quality: lacking  Affect:  Appropriate  Cognitive:  Unable to assess  Insight: lacking  Engagement in Group cannot assess  Modes of Intervention:  Activity, Discussion, Education, and Support  Additional Comments: Rates his energy as a 7/10 at the start of the group and then would not share anything else  Paulino Rily 11/26/2020`

## 2020-11-27 MED ORDER — BENZTROPINE MESYLATE 0.5 MG PO TABS
0.5000 mg | ORAL_TABLET | Freq: Two times a day (BID) | ORAL | Status: DC
  Administered 2020-11-27 – 2020-11-28 (×2): 0.5 mg via ORAL
  Filled 2020-11-27 (×4): qty 1

## 2020-11-27 MED ORDER — GABAPENTIN 400 MG PO CAPS
400.0000 mg | ORAL_CAPSULE | Freq: Three times a day (TID) | ORAL | Status: DC
  Administered 2020-11-27 – 2020-11-28 (×3): 400 mg via ORAL
  Filled 2020-11-27 (×6): qty 1

## 2020-11-27 MED ORDER — BUSPIRONE HCL 10 MG PO TABS
10.0000 mg | ORAL_TABLET | Freq: Two times a day (BID) | ORAL | Status: DC
  Administered 2020-11-27 – 2020-11-29 (×4): 10 mg via ORAL
  Filled 2020-11-27 (×8): qty 1

## 2020-11-27 NOTE — [facility] Group Notes (Signed)
LCSW Group Therapy Notes  Type of Therapy and Topic: Group Therapy: Core Beliefs  Participation Level: Minimal  Description of Group: In this group patients will be encouraged to explore their negative and positive core beliefs about themselves, others, and the world. Each patient will be challenged to identify these beliefs and ways to challenge negative core beliefs. This group will be process-oriented, with patients participating in exploration of their own experiences as well as giving and receiving support and challenge from other group members.  Therapeutic Goals: 1. Patient will identify personal core beliefs, both negative and positive. 2. Patient will identify core beliefs relating to others, both negative and positive. 3. Patient will challenge their negative beliefs about themselves and others. 4. Patient will identify three changes they can make to replace negative core beliefs with positive beliefs.  Summary of Patient Progress: Patient attended group, however offered limited engagement.    Therapeutic Modalities: Cognitive Behavioral Therapy Solution Focused Therapy Motivational Interviewing  Darletta Moll MSW, [place] Worker  [facility]

## 2020-11-27 NOTE — Progress Notes (Signed)
[facility] MD Progress Note  11/27/2020 10:40 AM Tyler White  MRN:  [id number] Subjective: Patient is a 24 year old male with a past psychiatric history significant for schizophrenia who was sent from the homeless shelter that he was living in to the [facility] emergency department on 11/20/2020 secondary to the fact he was suicidal with a plan to overdose on pills.  Objective: Patient is seen and examined. Patient is a 24 year old male with the above-stated past psychiatric history who is seen in follow-up. He stated he is doing okay. He stated he is anxious. He is psychomotor agitated. He stated that shots have helped more than the pills did. He denied auditory or visual hallucinations. He denied suicidal or homicidal ideation. He does appear to still have paranoia. His vital signs are stable, he is afebrile. He slept 6 hours last night. Review of his admission laboratories revealed a mildly low potassium at 3.4, but normal liver function enzymes as well as creatinine. His lipid panel was normal. CBC was normal. Acetaminophen was less than 10, salicylate less than 7. His hemoglobin A1c was 5.1. TSH was 0.658. Drug screen was positive for marijuana and cocaine. EKG showed a normal sinus rhythm with a normal QTc interval.  Principal Problem: Schizophrenia (HCC) Diagnosis: Principal Problem:   Schizophrenia (HCC) Active Problems:   Cannabis abuse with psychotic disorder, with delusions (HCC)   Psychosis ([place])   Cocaine abuse ([place])   Cocaine abuse with cocaine-induced mood disorder ([place])  Total Time spent with patient: 20 minutes  Past Psychiatric History: See admission H&P  Past Medical History:  Past Medical History:  Diagnosis Date  . Hernia, inguinal, right   . Inguinal hernia    right  . Psychiatric illness   . Schizophrenia ([place])    History reviewed. No pertinent surgical history. Family History:  Family History  Problem Relation Age of Onset  . Psychiatric  Illness Mother   . Hypertension Sister    Family Psychiatric  History: See admission H&P Social History:  Social History   Substance and Sexual Activity  Alcohol Use Yes     Social History   Substance and Sexual Activity  Drug Use Yes  . Types: Marijuana    Social History   Socioeconomic History  . Marital status: Single    Spouse name: Not on file  . Number of children: Not on file  . Years of education: Not on file  . Highest education level: Not on file  Occupational History  . Not on file  Tobacco Use  . Smoking status: Current Every Day Smoker    Packs/day: 0.50    Years: 5.00    Pack years: 2.50    Types: Cigarettes  . Smokeless tobacco: Never Used  Vaping Use  . Vaping Use: Never used  Substance and Sexual Activity  . Alcohol use: Yes  . Drug use: Yes    Types: Marijuana  . Sexual activity: Yes    Birth control/protection: None  Other Topics Concern  . Not on file  Social History Narrative   ** Merged History Encounter **       Social Determinants of Health   Financial Resource Strain:   . Difficulty of Paying Living Expenses: Not on file  Food Insecurity:   . Worried About Charity fundraiser in the Last Year: Not on file  . Ran Out of Food in the Last Year: Not on file  Transportation Needs:   . Lack of Transportation (Medical): Not on  file  . Lack of Transportation (Non-Medical): Not on file  Physical Activity:   . Days of Exercise per Week: Not on file  . Minutes of Exercise per Session: Not on file  Stress:   . Feeling of Stress : Not on file  Social Connections:   . Frequency of Communication with Friends and Family: Not on file  . Frequency of Social Gatherings with Friends and Family: Not on file  . Attends Religious Services: Not on file  . Active Member of Clubs or Organizations: Not on file  . Attends Archivist Meetings: Not on file  . Marital Status: Not on file   Additional Social History:                          Sleep: Fair  Appetite:  Fair  Current Medications: Current Facility-Administered Medications  Medication Dose Route Frequency Provider Last Rate Last Admin  . alum & mag hydroxide-simeth (MAALOX/MYLANTA) 200-200-20 MG/5ML suspension 30 mL  30 mL Oral Q4H PRN Merlyn Lot E, NP      . haloperidol lactate (HALDOL) injection 5 mg  5 mg Intramuscular Q8H PRN Merlyn Lot E, NP       And  . diphenhydrAMINE (BENADRYL) injection 25 mg  25 mg Intramuscular Q8H PRN Mallie Darting, NP       And  . LORazepam (ATIVAN) injection 1 mg  1 mg Intramuscular Q8H PRN Merlyn Lot E, NP      . divalproex (DEPAKOTE ER) 24 hr tablet 500 mg  500 mg Oral QHS Lavella Hammock, MD   500 mg at 11/26/20 2053  . gabapentin (NEURONTIN) capsule 300 mg  300 mg Oral TID Lavella Hammock, MD   300 mg at 11/27/20 0744  . hydrOXYzine (ATARAX/VISTARIL) tablet 25 mg  25 mg Oral TID PRN Nwoko, Uchenna E, PA   25 mg at 11/25/20 1159  . magnesium hydroxide (MILK OF MAGNESIA) suspension 30 mL  30 mL Oral Daily PRN Mallie Darting, NP      . Derrill Memo ON 11/29/2020] paliperidone (INVEGA SUSTENNA) injection 156 mg  156 mg Intramuscular Once Lavella Hammock, MD      . Derrill Memo ON 12/27/2020] paliperidone (INVEGA SUSTENNA) injection 234 mg  234 mg Intramuscular Once Lavella Hammock, MD      . risperiDONE (RISPERDAL) tablet 2 mg  2 mg Oral BH-qamhs Lavella Hammock, MD   2 mg at 11/27/20 0744  . thiamine tablet 100 mg  100 mg Oral Daily Merlyn Lot E, NP   100 mg at 11/26/20 5284   Or  . thiamine (B-1) injection 100 mg  100 mg Intravenous Daily Merlyn Lot E, NP   100 mg at 11/27/20 0745    Lab Results: No results found for this or any previous visit (from the past 48 hour(s)).  Blood Alcohol level:  Lab Results  Component Value Date   ETH 23 (H) 11/18/2020   ETH 82 (H) 13/24/4010    Metabolic Disorder Labs: Lab Results  Component Value Date   HGBA1C 5.1 11/02/2020   MPG 99.67 11/02/2020   MPG 85.32 05/30/2018    Lab Results  Component Value Date   PROLACTIN 32.5 (H) 05/30/2018   Lab Results  Component Value Date   CHOL 148 11/02/2020   TRIG 47 11/02/2020   HDL 71 11/02/2020   CHOLHDL 2.1 11/02/2020   VLDL 9 11/02/2020   LDLCALC 68 11/02/2020   LDLCALC  107 (H) 05/30/2018    Physical Findings: AIMS: Facial and Oral Movements Muscles of Facial Expression: None, normal Lips and Perioral Area: None, normal Jaw: None, normal Tongue: None, normal,Extremity Movements Upper (arms, wrists, hands, fingers): None, normal Lower (legs, knees, ankles, toes): None, normal, Trunk Movements Neck, shoulders, hips: None, normal, Overall Severity Severity of abnormal movements (highest score from questions above): None, normal Incapacitation due to abnormal movements: None, normal Patient's awareness of abnormal movements (rate only patient's report): No Awareness, Dental Status Current problems with teeth and/or dentures?: No Does patient usually wear dentures?: No  CIWA:  CIWA-Ar Total: 1 COWS:     Musculoskeletal: Strength & Muscle Tone: within normal limits Gait & Station: normal Patient leans: N/A  Psychiatric Specialty Exam: Physical Exam Vitals and nursing note reviewed.  Constitutional:      Appearance: Normal appearance. He is normal weight.  HENT:     Head: Normocephalic and atraumatic.  Pulmonary:     Effort: Pulmonary effort is normal.  Neurological:     General: No focal deficit present.     Mental Status: He is alert and oriented to person, place, and time.     Review of Systems  Blood pressure 133/87, pulse 84, temperature 98.2 F (36.8 C), temperature source Oral, resp. rate 16, height 5\' 8"  (1.727 m), weight 59 kg, SpO2 100 %.Body mass index is 19.77 kg/m.  General Appearance: Disheveled  Eye Contact:  Fair  Speech:  Normal Rate  Volume:  Decreased  Mood:  Dysphoric  Affect:  Congruent  Thought Process:  Coherent and Descriptions of Associations: Circumstantial   Orientation:  Full (Time, Place, and Person)  Thought Content:  Delusions and Paranoid Ideation  Suicidal Thoughts:  No  Homicidal Thoughts:  No  Memory:  Immediate;   Fair Recent;   Fair Remote;   Fair  Judgement:  Intact  Insight:  Lacking  Psychomotor Activity:  Increased  Concentration:  Concentration: Fair and Attention Span: Fair  Recall:  [employer] of Knowledge:  Fair  Language:  Fair  Akathisia:  Yes  Handed:  Right  AIMS (if indicated):     Assets:  Desire for Improvement Resilience  ADL's:  Intact  Cognition:  WNL  Sleep:  Number of Hours: 6     Treatment Plan Summary: Daily contact with patient to assess and evaluate symptoms and progress in treatment, Medication management and Plan : Patient is seen and examined. Patient is a 24 year old male with the above-stated past psychiatric history who is seen in follow-up.   Diagnosis: 1. Schizophrenia 2. Cannabis use disorder 3. Cocaine use disorder  Pertinent findings on examination today: 1. Denies auditory or visual hallucinations. 2. Denies suicidal or homicidal ideation. 3. He is dysphoric, anxious, and appears to be paranoid.  Plan: 1. Continue Depakote 500 mg p.o. nightly for mood stability. 2. Increase gabapentin to 400 mg p.o. 3 times daily for mood stability as well as anxiety. 3. Continue hydroxyzine 25 mg p.o. 3 times daily as needed anxiety. 4. Anemia panel was ordered, but apparently not drawn. We will reorder that. 5. Patient received the long-acting paliperidone injection 234 mg IM on 11/27/2020. 6. Patient is to receive the long-acting paliperidone injection 156 mg IM on 11/29/2020. This is for psychosis. 7. Continue Risperdal 2 mg p.o. twice daily for psychosis. 8. Continue thiamine 100 mg p.o. daily for nutritional supplementation. 9. Add buspirone 10 mg p.o. twice daily for anxiety. 10. Add Cogentin 0.5 mg p.o. twice daily in case  psychomotor agitation is secondary to the Risperdal. 11.  Disposition planning-in progress.  Sharma Covert, MD 11/27/2020, 10:40 AM

## 2020-11-27 NOTE — Progress Notes (Signed)
   11/26/20 2053  COVID-19 Daily Checkoff  Have you had a fever (temp > 37.80C/100F)  in the past 24 hours?  No  COVID-19 EXPOSURE  Have you traveled outside the state in the past 14 days? No  Have you been in contact with someone with a confirmed diagnosis of COVID-19 or PUI in the past 14 days without wearing appropriate PPE? No  Have you been living in the same home as a person with confirmed diagnosis of COVID-19 or a PUI (household contact)? No  Have you been diagnosed with COVID-19? No

## 2020-11-27 NOTE — [facility] Group Notes (Deleted)
The focus of this group is to help patients establish daily goals to achieve during treatment and discuss how the patient can incorporate goal setting into their daily lives to aide in recovery.   Patient did not attend group.

## 2020-11-27 NOTE — Progress Notes (Addendum)
   11/26/20 2053  Psych Admission Type (Psych Patients Only)  Admission Status Voluntary  Psychosocial Assessment  Patient Complaints None  Eye Contact Brief  Facial Expression Flat  Affect Blunted;Preoccupied  Speech Soft;Slow  Interaction Avoidant;Forwards little;Guarded;Isolative;Minimal  Motor Activity Fidgety  Appearance/Hygiene Bizarre;Disheveled  Behavior Characteristics Cooperative  Mood Depressed;Anxious;Preoccupied  Thought Process  Coherency Blocking  Content Preoccupation  Delusions None reported or observed  Perception Hallucinations  Hallucination Auditory;Visual  Judgment Limited  Confusion None  Danger to Self  Current suicidal ideation? Denies  Danger to Others  Danger to Others None reported or observed

## 2020-11-27 NOTE — Progress Notes (Signed)
   11/27/20 0628  Vital Signs  Temp 98.2 F (36.8 C)  Temp Source Oral  Pulse Rate 60  BP 134/82  BP Location Left Arm  BP Method Automatic  Patient Position (if appropriate) Sitting   D: Patient denies SI/HI/AVH. Pt. Has been isolative in his room. Pt. Did attend morning and afternoon group. Pt. Denies anxiety and depression. A:  Patient took scheduled medicine.  Support and encouragement provided Routine safety checks conducted every 15 minutes. Patient  Informed to notify staff with any concerns.  R: Safety maintained.

## 2020-11-27 NOTE — [facility] Group Notes (Signed)
The focus of this group is to help patients establish daily goals to achieve during treatment and discuss how the patient can incorporate goal setting into their daily lives to aide in recovery.    Patient attended group and was attentive but did not contribute to group.

## 2020-11-28 LAB — IRON AND TIBC
Iron: 46 ug/dL (ref 45–182)
Saturation Ratios: 12 % — ABNORMAL LOW (ref 17.9–39.5)
TIBC: 380 ug/dL (ref 250–450)
UIBC: 334 ug/dL

## 2020-11-28 LAB — VALPROIC ACID LEVEL: Valproic Acid Lvl: 27 ug/mL — ABNORMAL LOW (ref 50.0–100.0)

## 2020-11-28 LAB — FERRITIN: Ferritin: 110 ng/mL (ref 24–336)

## 2020-11-28 MED ORDER — GABAPENTIN 300 MG PO CAPS
300.0000 mg | ORAL_CAPSULE | Freq: Three times a day (TID) | ORAL | Status: DC
  Administered 2020-11-28 – 2020-11-29 (×3): 300 mg via ORAL
  Filled 2020-11-28 (×9): qty 1

## 2020-11-28 MED ORDER — BENZTROPINE MESYLATE 0.5 MG PO TABS: 0.5000 mg | ORAL_TABLET | Freq: Two times a day (BID) | ORAL | Status: DC | PRN

## 2020-11-28 MED ORDER — RISPERIDONE 2 MG PO TBDP
4.0000 mg | ORAL_TABLET | Freq: Every day | ORAL | Status: DC
  Administered 2020-11-28: 4 mg via ORAL
  Filled 2020-11-28 (×3): qty 2

## 2020-11-28 MED ORDER — PALIPERIDONE PALMITATE ER 156 MG/ML IM SUSY
156.0000 mg | PREFILLED_SYRINGE | Freq: Once | INTRAMUSCULAR | Status: AC
  Administered 2020-11-29: 156 mg via INTRAMUSCULAR

## 2020-11-28 MED ORDER — RISPERIDONE 2 MG PO TABS
2.0000 mg | ORAL_TABLET | Freq: Every day | ORAL | Status: DC
  Administered 2020-11-28: 2 mg via ORAL
  Filled 2020-11-28 (×2): qty 1

## 2020-11-28 MED ORDER — RISPERIDONE 2 MG PO TABS
2.0000 mg | ORAL_TABLET | Freq: Every day | ORAL | Status: DC
  Administered 2020-11-29: 2 mg via ORAL
  Filled 2020-11-28 (×3): qty 1

## 2020-11-28 MED ORDER — PALIPERIDONE PALMITATE ER 156 MG/ML IM SUSY
156.0000 mg | PREFILLED_SYRINGE | Freq: Once | INTRAMUSCULAR | Status: DC
  Filled 2020-11-28: qty 1

## 2020-11-28 MED ORDER — RISPERIDONE 1 MG PO TABS
1.0000 mg | ORAL_TABLET | Freq: Every day | ORAL | Status: DC
  Filled 2020-11-28: qty 1

## 2020-11-28 NOTE — Progress Notes (Signed)
Did not attend group 

## 2020-11-28 NOTE — Progress Notes (Signed)
This Probation officer was told by MHT that pt was becoming more anxious and agitated. Pt still very guarded and blunted. Pt nodded agreement that he is more anxious than before. Pt given Ativan 1 mg IM (left deltoid), Benadryl 25 mg IM (right deltoid) and Haldol 5 mg IM (right deltoid). Will continue to monitor.

## 2020-11-28 NOTE — Progress Notes (Signed)
   11/28/20 2044  Psych Admission Type (Psych Patients Only)  Admission Status Voluntary  Psychosocial Assessment  Patient Complaints Anxiety (per MHT this is what pt told him; pt denied any complaints with this Probation officer)  Eye Contact Brief  Facial Expression Flat  Affect Anxious;Blunted  Speech Soft;Slow  Interaction Forwards little;Guarded  Motor Activity Fidgety  Appearance/Hygiene Unremarkable  Behavior Characteristics Cooperative;Anxious  Mood Anxious  Thought Process  Coherency Blocking  Content Preoccupation  Delusions None reported or observed  Perception WDL  Hallucination None reported or observed  Judgment Limited  Confusion None  Danger to Self  Current suicidal ideation? Denies  Danger to Others  Danger to Others None reported or observed   Pt denies SI, HI, AVH and pain. Pt denies anxiety, but MHT on unit states that pt told him he was anxious. Pt was seen interacting in dayroom with other pts. Pt given night meds and Vistaril 25 mg. Last night, pt was also given agitation protocol meds an hour after night meds d/t increased anxiety. Will continue to monitor.

## 2020-11-28 NOTE — Progress Notes (Signed)
Adult Psychoeducational Group Note  Date:  11/28/2020 Time:  10:16 PM  Group Topic/Focus:  Wrap-Up Group:   The focus of this group is to help patients review their daily goal of treatment and discuss progress on daily workbooks.  Participation Level:  Minimal  Participation Quality:  Appropriate  Affect:  Anxious and Irritable  Cognitive:  Confused and Lacking  Insight: Lacking and Limited  Engagement in Group:  Lacking, Limited and Poor  Modes of Intervention:  Discussion  Additional Comments: Pt stated his goal for today was to focus on his treatment plan. Pt stated he accomplished his goal today. Pt stated he was able to talk with his doctor and social worker about his care today. Pt rated his overall day a 10. Pt did informed writer that he was anxious and agitated. Pt nurse was made aware of the situation. Pt stated he was able to contact his father and brother today which improved his day. Pt stated his relationship with his family and support system needs to be improved. Pt stated he was able to attend all meals. Pt stated he took all medications provided today. Pt stated his appetite was pretty good today. Pt rated sleep last night was pretty good. Pt stated he was no physical pain today. Pt deny auditory or visual hallucinations. Pt denies thoughts of harming himself or others. Pt stated he would alert staff if anything changes.   Candy Sledge 11/28/2020, 10:16 PM

## 2020-11-28 NOTE — Progress Notes (Signed)
Adult Psychoeducational Group Note  Date:  11/28/2020 Time:  2:38 AM  Group Topic/Focus:  Wrap-Up Group:   The focus of this group is to help patients review their daily goal of treatment and discuss progress on daily workbooks.  Participation Level:  Minimal  Participation Quality:  Appropriate  Affect:  Appropriate  Cognitive:  Disorganized and Confused  Insight: Limited  Engagement in Group:  Limited  Modes of Intervention:  Discussion  Additional Comments: Pt stated his goal for today was to focus on his treatment plan. Pt stated he accomplished his goal today. Pt stated he was able to talk with his doctor and social worker about his care today. Pt rated his overall day an 9 out of 10. Pt stated he was able to contact his father today which improved his day. Pt stated his relationship with his family and support system needs to be improved. Pt stated he was able to attend all meals. Pt stated he took all medications provided today. Pt stated his appetite was pretty good today. Pt rated sleep last night was pretty good. Pt stated he was no physical pain today. Pt deny auditory or visual hallucinations. Pt denies thoughts of harming himself or others. Pt stated he would alert staff if anything changes.   Candy Sledge 11/28/2020, 2:38 AM

## 2020-11-28 NOTE — Progress Notes (Signed)
   11/28/20 0730  Vital Signs  Temp 98.2 F (36.8 C)  Temp Source Oral  Pulse Rate 96  Pulse Rate Source Dinamap  Resp 18  BP 135/81  BP Location Left Arm  BP Method Automatic  Patient Position (if appropriate) Sitting  Oxygen Therapy  SpO2 100 %  Pain Assessment  Pain Scale 0-10  Pain Score 0   D: Patient denies SI/HI/AVH. Pt. Isolates in room. A:  Patient took scheduled medicine.  Support and encouragement provided Routine safety checks conducted every 15 minutes. Patient  Informed to notify staff with any concerns.   R: Safety maintained.

## 2020-11-28 NOTE — Progress Notes (Signed)
[facility] MD Progress Note  11/28/2020 10:21 AM Tyler White  MRN:  [id number] Subjective:  Patient is a 24 year old male with a past psychiatric history significant for schizophrenia who was sent from the homeless shelter that he was living in to the [facility] emergency department on 11/20/2020 secondary to the fact he was suicidal with a plan to overdose on pills.  Objective: Patient is seen and examined.  Patient is a 24 year old male with the above-stated past psychiatric history who is seen in follow-up.  He is sedated today.  He still in bed.  He denied any problems with anxiety.  He does feel oversedated.  He denied any auditory or visual hallucinations.  He denied any suicidal or homicidal ideation.  Yesterday we increased his gabapentin to 400 mg p.o. 3 times daily for mood stability as well as for anxiety.  His Risperdal was continued at 2 mg p.o. twice daily.  He also received buspirone 10 mg p.o. twice daily and Cogentin 0.5 mg p.o. twice daily.  His vital signs are stable, he is afebrile.  He slept 6 hours last night.  Unfortunately his laboratories were not drawn this morning, and we have reordered those for this afternoon.  Principal Problem: Schizophrenia ([place]) Diagnosis: Principal Problem:   Schizophrenia ([place]) Active Problems:   Cannabis abuse with psychotic disorder, with delusions ([place])   Psychosis ([place])   Cocaine abuse ([place])   Cocaine abuse with cocaine-induced mood disorder ([place])  Total Time spent with patient: 20 minutes  Past Psychiatric History: See admission H&P  Past Medical History:  Past Medical History:  Diagnosis Date  . Hernia, inguinal, right   . Inguinal hernia    right  . Psychiatric illness   . Schizophrenia ([place])    History reviewed. No pertinent surgical history. Family History:  Family History  Problem Relation Age of Onset  . Psychiatric Illness Mother   . Hypertension Sister    Family Psychiatric  History: See admission  H&P Social History:  Social History   Substance and Sexual Activity  Alcohol Use Yes     Social History   Substance and Sexual Activity  Drug Use Yes  . Types: Marijuana    Social History   Socioeconomic History  . Marital status: Single    Spouse name: Not on file  . Number of children: Not on file  . Years of education: Not on file  . Highest education level: Not on file  Occupational History  . Not on file  Tobacco Use  . Smoking status: Current Every Day Smoker    Packs/day: 0.50    Years: 5.00    Pack years: 2.50    Types: Cigarettes  . Smokeless tobacco: Never Used  Vaping Use  . Vaping Use: Never used  Substance and Sexual Activity  . Alcohol use: Yes  . Drug use: Yes    Types: Marijuana  . Sexual activity: Yes    Birth control/protection: None  Other Topics Concern  . Not on file  Social History Narrative   ** Merged History Encounter **       Social Determinants of Health   Financial Resource Strain:   . Difficulty of Paying Living Expenses: Not on file  Food Insecurity:   . Worried About Charity fundraiser in the Last Year: Not on file  . Ran Out of Food in the Last Year: Not on file  Transportation Needs:   . Lack of Transportation (Medical): Not on file  .  Lack of Transportation (Non-Medical): Not on file  Physical Activity:   . Days of Exercise per Week: Not on file  . Minutes of Exercise per Session: Not on file  Stress:   . Feeling of Stress : Not on file  Social Connections:   . Frequency of Communication with Friends and Family: Not on file  . Frequency of Social Gatherings with Friends and Family: Not on file  . Attends Religious Services: Not on file  . Active Member of Clubs or Organizations: Not on file  . Attends Archivist Meetings: Not on file  . Marital Status: Not on file   Additional Social History:                         Sleep: Good  Appetite:  Good  Current Medications: Current  Facility-Administered Medications  Medication Dose Route Frequency Provider Last Rate Last Admin  . alum & mag hydroxide-simeth (MAALOX/MYLANTA) 200-200-20 MG/5ML suspension 30 mL  30 mL Oral Q4H PRN Merlyn Lot E, NP      . benztropine (COGENTIN) tablet 0.5 mg  0.5 mg Oral BID Sharma Covert, MD   0.5 mg at 11/28/20 0730  . busPIRone (BUSPAR) tablet 10 mg  10 mg Oral BID Sharma Covert, MD   10 mg at 11/28/20 0730  . haloperidol lactate (HALDOL) injection 5 mg  5 mg Intramuscular Q8H PRN Merlyn Lot E, NP   5 mg at 11/27/20 2140   And  . diphenhydrAMINE (BENADRYL) injection 25 mg  25 mg Intramuscular Q8H PRN Merlyn Lot E, NP   25 mg at 11/27/20 2139   And  . LORazepam (ATIVAN) injection 1 mg  1 mg Intramuscular Q8H PRN Merlyn Lot E, NP   1 mg at 11/27/20 2140  . divalproex (DEPAKOTE ER) 24 hr tablet 500 mg  500 mg Oral QHS Lavella Hammock, MD   500 mg at 11/27/20 2022  . gabapentin (NEURONTIN) capsule 400 mg  400 mg Oral TID Sharma Covert, MD   400 mg at 11/28/20 0730  . hydrOXYzine (ATARAX/VISTARIL) tablet 25 mg  25 mg Oral TID PRN Ileene Musa E, PA   25 mg at 11/27/20 2023  . magnesium hydroxide (MILK OF MAGNESIA) suspension 30 mL  30 mL Oral Daily PRN Mallie Darting, NP      . Derrill Memo ON 11/29/2020] paliperidone (INVEGA SUSTENNA) injection 156 mg  156 mg Intramuscular Once Lavella Hammock, MD      . Derrill Memo ON 12/27/2020] paliperidone (INVEGA SUSTENNA) injection 234 mg  234 mg Intramuscular Once Lavella Hammock, MD      . risperiDONE (RISPERDAL M-TABS) disintegrating tablet 4 mg  4 mg Oral QHS Sharma Covert, MD      . Derrill Memo ON 11/29/2020] risperiDONE (RISPERDAL) tablet 1 mg  1 mg Oral Daily Sharma Covert, MD      . thiamine tablet 100 mg  100 mg Oral Daily Merlyn Lot E, NP   100 mg at 11/28/20 1610   Or  . thiamine (B-1) injection 100 mg  100 mg Intravenous Daily Merlyn Lot E, NP   100 mg at 11/27/20 0745    Lab Results: No results found for this  or any previous visit (from the past 57 hour(s)).  Blood Alcohol level:  Lab Results  Component Value Date   ETH 23 (H) 11/18/2020   ETH 82 (H) 96/03/5408    Metabolic Disorder Labs: Lab Results  Component Value Date   HGBA1C 5.1 11/02/2020   MPG 99.67 11/02/2020   MPG 85.32 05/30/2018   Lab Results  Component Value Date   PROLACTIN 32.5 (H) 05/30/2018   Lab Results  Component Value Date   CHOL 148 11/02/2020   TRIG 47 11/02/2020   HDL 71 11/02/2020   CHOLHDL 2.1 11/02/2020   VLDL 9 11/02/2020   LDLCALC 68 11/02/2020   LDLCALC 107 (H) 05/30/2018    Physical Findings: AIMS: Facial and Oral Movements Muscles of Facial Expression: None, normal Lips and Perioral Area: None, normal Jaw: None, normal Tongue: None, normal,Extremity Movements Upper (arms, wrists, hands, fingers): None, normal Lower (legs, knees, ankles, toes): None, normal, Trunk Movements Neck, shoulders, hips: None, normal, Overall Severity Severity of abnormal movements (highest score from questions above): None, normal Incapacitation due to abnormal movements: None, normal Patient's awareness of abnormal movements (rate only patient's report): No Awareness, Dental Status Current problems with teeth and/or dentures?: No Does patient usually wear dentures?: No  CIWA:  CIWA-Ar Total: 1 COWS:     Musculoskeletal: Strength & Muscle Tone: within normal limits Gait & Station: normal Patient leans: N/A  Psychiatric Specialty Exam: Physical Exam Vitals and nursing note reviewed.  Constitutional:      Appearance: Normal appearance.  HENT:     Head: Normocephalic and atraumatic.  Pulmonary:     Effort: Pulmonary effort is normal.  Neurological:     General: No focal deficit present.     Mental Status: He is alert and oriented to person, place, and time.     Review of Systems  Blood pressure 129/75, pulse (!) 118, temperature 98.2 F (36.8 C), temperature source Oral, resp. rate 16, height 5\' 8"   (1.727 m), weight 59 kg, SpO2 100 %.Body mass index is 19.77 kg/m.  General Appearance: Casual  Eye Contact:  Fair  Speech:  Normal Rate  Volume:  Decreased  Mood:  Sedated  Affect:  Congruent  Thought Process:  Goal Directed and Descriptions of Associations: Circumstantial  Orientation:  Full (Time, Place, and Person)  Thought Content:  Negative  Suicidal Thoughts:  No  Homicidal Thoughts:  No  Memory:  Immediate;   Fair Recent;   Fair Remote;   Fair  Judgement:  Intact  Insight:  Fair  Psychomotor Activity:  Decreased  Concentration:  Concentration: Fair and Attention Span: Fair  Recall:  [employer] of Knowledge:  Fair  Language:  Fair  Akathisia:  Negative  Handed:  Right  AIMS (if indicated):     Assets:  Desire for Improvement Resilience  ADL's:  Intact  Cognition:  WNL  Sleep:  Number of Hours: 6     Treatment Plan Summary: Daily contact with patient to assess and evaluate symptoms and progress in treatment, Medication management and Plan : Patient is seen and examined.  Patient is a 24 year old male with the above-stated past psychiatric history who is seen in follow-up.  Diagnosis: 1.  Schizophrenia. 2.  Cannabis use disorder. 3.  Cocaine use disorder.  Pertinent findings on examination today: 1.  Denies auditory or visual hallucinations. 2.  Denies suicidal or homicidal ideation. 3.  Oversedated this morning.  Plan: 1.Continue Depakote 500 mg p.o. nightly for mood stability. 2.  Decrease gabapentin back to 300 mg p.o. 3 times daily for mood stability as well as anxiety. 3. Continue hydroxyzine 25 mg p.o. 3 times daily as needed anxiety. 4. Anemia panel was ordered, but apparently not drawn. We will reorder that.  Additionally will get Depakote level as well as CBC and metabolic panel. 5. Patient received the long-acting paliperidone injection 234 mg IM on 11/27/2020. 6. Patient is to receive the long-acting paliperidone injection 156 mg IM on 11/29/2020.  This is for psychosis. 7. Continue Risperdal 2 mg p.o. twice daily for psychosis. 8. Continue thiamine 100 mg p.o. daily for nutritional supplementation. 9.  Continue buspirone 10 mg p.o. twice daily for anxiety. 10.  Change Cogentin 0.5 mg p.o. twice daily as needed in case psychomotor agitation is secondary to the Risperdal. 11. Disposition planning-in progress.  Sharma Covert, MD 11/28/2020, 10:21 AM

## 2020-11-28 NOTE — Progress Notes (Signed)
Patient remains bizarre on approach and he appears to be responding to internal stimuli even though he denies SI/HI/AVH. Pt. denies anxiety and depression. Patient was observed in the dayroom during the evening rocking back and forth and posturing as if he was upset. Patient voiced that he wanted medicine and he was given Ativan, Haldol and Benadryl x 1 given with effect. He is currently in bed resting quietly

## 2020-11-29 LAB — CBC WITH DIFFERENTIAL/PLATELET
Abs Immature Granulocytes: 0.02 10*3/uL (ref 0.00–0.07)
Basophils Absolute: 0 10*3/uL (ref 0.0–0.1)
Basophils Relative: 1 %
Eosinophils Absolute: 0.3 10*3/uL (ref 0.0–0.5)
Eosinophils Relative: 3 %
HCT: 44.1 % (ref 39.0–52.0)
Hemoglobin: 14.4 g/dL (ref 13.0–17.0)
Immature Granulocytes: 0 %
Lymphocytes Relative: 25 %
Lymphs Abs: 2.1 10*3/uL (ref 0.7–4.0)
MCH: 28.5 pg (ref 26.0–34.0)
MCHC: 32.7 g/dL (ref 30.0–36.0)
MCV: 87.2 fL (ref 80.0–100.0)
Monocytes Absolute: 0.6 10*3/uL (ref 0.1–1.0)
Monocytes Relative: 7 %
Neutro Abs: 5.2 10*3/uL (ref 1.7–7.7)
Neutrophils Relative %: 64 %
Platelets: 310 10*3/uL (ref 150–400)
RBC: 5.06 MIL/uL (ref 4.22–5.81)
RDW: 14 % (ref 11.5–15.5)
WBC: 8.2 10*3/uL (ref 4.0–10.5)
nRBC: 0 % (ref 0.0–0.2)

## 2020-11-29 LAB — COMPREHENSIVE METABOLIC PANEL
ALT: 39 U/L (ref 0–44)
AST: 56 U/L — ABNORMAL HIGH (ref 15–41)
Albumin: 4.3 g/dL (ref 3.5–5.0)
Alkaline Phosphatase: 57 U/L (ref 38–126)
Anion gap: 11 (ref 5–15)
BUN: 14 mg/dL (ref 6–20)
CO2: 25 mmol/L (ref 22–32)
Calcium: 9.5 mg/dL (ref 8.9–10.3)
Chloride: 103 mmol/L (ref 98–111)
Creatinine, Ser: 0.92 mg/dL (ref 0.61–1.24)
GFR, Estimated: >60 mL/min (ref >60)
Glucose, Bld: 108 mg/dL — ABNORMAL HIGH (ref 70–99)
Potassium: 4.2 mmol/L (ref 3.5–5.1)
Sodium: 139 mmol/L (ref 135–145)
Total Bilirubin: 0.5 mg/dL (ref 0.3–1.2)
Total Protein: 7.4 g/dL (ref 6.5–8.1)

## 2020-11-29 MED ORDER — RISPERIDONE 2 MG PO TABS: 2.0000 mg | ORAL_TABLET | Freq: Every day | ORAL | 0 refills | Status: DC

## 2020-11-29 MED ORDER — PALIPERIDONE PALMITATE ER 234 MG/1.5ML IM SUSY: 234.0000 mg | PREFILLED_SYRINGE | Freq: Once | INTRAMUSCULAR | 0 refills | Status: DC

## 2020-11-29 MED ORDER — GABAPENTIN 300 MG PO CAPS: 300.0000 mg | ORAL_CAPSULE | Freq: Three times a day (TID) | ORAL | 0 refills | Status: DC

## 2020-11-29 MED ORDER — BENZTROPINE MESYLATE 0.5 MG PO TABS: 0.5000 mg | ORAL_TABLET | Freq: Two times a day (BID) | ORAL | 0 refills | Status: DC | PRN

## 2020-11-29 MED ORDER — BUSPIRONE HCL 10 MG PO TABS: 10.0000 mg | ORAL_TABLET | Freq: Two times a day (BID) | ORAL | 0 refills | Status: DC

## 2020-11-29 MED ORDER — RISPERIDONE 3 MG PO TABS
6.0000 mg | ORAL_TABLET | Freq: Every day | ORAL | Status: DC
  Filled 2020-11-29 (×2): qty 2

## 2020-11-29 MED ORDER — RISPERIDONE 3 MG PO TBDP: 6.0000 mg | ORAL_TABLET | Freq: Every day | ORAL | Status: DC

## 2020-11-29 MED ORDER — DIVALPROEX SODIUM ER 250 MG PO TB24: 750.0000 mg | ORAL_TABLET | Freq: Every day | ORAL | 0 refills | Status: DC

## 2020-11-29 MED ORDER — RISPERIDONE 3 MG PO TABS: 6.0000 mg | ORAL_TABLET | Freq: Every day | ORAL | 0 refills | Status: DC

## 2020-11-29 MED ORDER — DIVALPROEX SODIUM 250 MG PO DR TAB
250.0000 mg | DELAYED_RELEASE_TABLET | Freq: Once | ORAL | Status: AC
  Administered 2020-11-29: 250 mg via ORAL
  Filled 2020-11-29 (×2): qty 1

## 2020-11-29 MED ORDER — DIVALPROEX SODIUM ER 250 MG PO TB24
750.0000 mg | ORAL_TABLET | Freq: Every day | ORAL | Status: DC
  Filled 2020-11-29 (×2): qty 3

## 2020-11-29 MED ORDER — HYDROXYZINE HCL 25 MG PO TABS: 25.0000 mg | ORAL_TABLET | Freq: Three times a day (TID) | ORAL | 0 refills | Status: DC | PRN

## 2020-11-29 NOTE — Progress Notes (Signed)
  [facility] Adult Case Management Discharge Plan :  Will you be returning to the same living situation after discharge:  No. Will be staying at [facility] At discharge, do you have transportation home?: No. Safe Transport to be arranged Do you have the ability to pay for your medications: Yes,  has insurance   Release of information consent forms completed and in the chart;  Patient's signature needed at discharge.  Patient to Follow up at:  [facility]. Go to.   Specialty: Behavioral Health Why: Walk-in to establish services.  Contact information: [place] [phone]              Next level of care provider has access to [place] and Suicide Prevention discussed: Yes,  with patient  Have you used any form of tobacco in the last 30 days? (Cigarettes, Smokeless Tobacco, Cigars, and/or Pipes): No  Has patient been referred to the Quitline?: N/A patient is not a smoker  Patient has been referred for addiction treatment: Pt. refused referral  Vassie Moselle, [place] 11/29/2020, 10:19 AM
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
## 2020-11-29 NOTE — BHH Suicide Risk Assessment (Signed)
[facility] Discharge Suicide Risk Assessment   Principal Problem: Schizophrenia [facility]) Discharge Diagnoses: Principal Problem:   Schizophrenia ([place]) Active Problems:   Cannabis abuse with psychotic disorder, with delusions ([place])   Psychosis ([place])   Cocaine abuse ([place])   Cocaine abuse with cocaine-induced mood disorder ([place])   Total Time spent with patient: 30 minutes  Musculoskeletal: Strength & Muscle Tone: within normal limits Gait & Station: normal Patient leans: N/A  Psychiatric Specialty Exam: Review of Systems  All other systems reviewed and are negative.   Blood pressure 122/75, pulse (!) 126, temperature 97.8 F (36.6 C), temperature source Oral, resp. rate 18, height 5\' 8"  (1.727 m), weight 59 kg, SpO2 100 %.Body mass index is 19.77 kg/m.  General Appearance: Casual  Eye Contact::  Fair  Speech:  Normal Rate409  Volume:  Normal  Mood:  Euthymic  Affect:  Flat  Thought Process:  Coherent and Descriptions of Associations: Intact  Orientation:  Full (Time, Place, and Person)  Thought Content:  Logical  Suicidal Thoughts:  No  Homicidal Thoughts:  No  Memory:  Immediate;   Fair Recent;   Fair Remote;   Fair  Judgement:  Intact  Insight:  Lacking  Psychomotor Activity:  Normal  Concentration:  Fair  Recall:  [employer] of Knowledge:Fair  Language: Good  Akathisia:  Negative  Handed:  Right  AIMS (if indicated):     Assets:  Desire for Improvement Resilience  Sleep:  Number of Hours: 6  Cognition: WNL  ADL's:  Intact   Mental Status Per Nursing Assessment::   On Admission:  NA  Demographic Factors:  Male, Low socioeconomic status, Living alone and Unemployed  Loss Factors: Financial problems/change in socioeconomic status  Historical Factors: Impulsivity  Risk Reduction Factors:   NA  Continued Clinical Symptoms:  Schizophrenia:   Less than 17 years old Paranoid or undifferentiated type  Cognitive Features That Contribute To Risk:  None     Suicide Risk:  Minimal: No identifiable suicidal ideation.  Patients presenting with no risk factors but with morbid ruminations; may be classified as minimal risk based on the severity of the depressive symptoms   Follow-up Information    [facility] Follow up on 11/30/2020.   Why: You have an assessment appointment on 11/30/20 at 10:00 am for therapy and medication management services  The provider can pick you up from your home for your appointments. Contact information: [place] [address] [phone]               Plan Of Care/Follow-up recommendations:  Activity:  ad lib  Sharma Covert, MD 11/29/2020, 9:27 AM

## 2020-11-29 NOTE — Tx Team (Signed)
Interdisciplinary Treatment and Diagnostic Plan Update  11/29/2020 Time of Session: 9:35am  Tyler White MRN: [id number]  Principal Diagnosis: Schizophrenia [facility])  Secondary Diagnoses: Principal Problem:   Schizophrenia ([place]) Active Problems:   Cannabis abuse with psychotic disorder, with delusions ([place])   Psychosis ([place])   Cocaine abuse ([place])   Cocaine abuse with cocaine-induced mood disorder ([place])   Current Medications:  Current Facility-Administered Medications  Medication Dose Route Frequency Provider Last Rate Last Admin  . alum & mag hydroxide-simeth (MAALOX/MYLANTA) 200-200-20 MG/5ML suspension 30 mL  30 mL Oral Q4H PRN Merlyn Lot E, NP      . benztropine (COGENTIN) tablet 0.5 mg  0.5 mg Oral BID PRN Sharma Covert, MD      . busPIRone (BUSPAR) tablet 10 mg  10 mg Oral BID Sharma Covert, MD   10 mg at 11/29/20 0755  . haloperidol lactate (HALDOL) injection 5 mg  5 mg Intramuscular Q8H PRN Merlyn Lot E, NP   5 mg at 11/28/20 2117   And  . diphenhydrAMINE (BENADRYL) injection 25 mg  25 mg Intramuscular Q8H PRN Mallie Darting, NP   25 mg at 11/28/20 2117   And  . LORazepam (ATIVAN) injection 1 mg  1 mg Intramuscular Q8H PRN Merlyn Lot E, NP   1 mg at 11/28/20 2114  . divalproex (DEPAKOTE ER) 24 hr tablet 750 mg  750 mg Oral QHS Sharma Covert, MD      . divalproex (DEPAKOTE) DR tablet 250 mg  250 mg Oral Once Sharma Covert, MD      . gabapentin (NEURONTIN) capsule 300 mg  300 mg Oral TID Sharma Covert, MD   300 mg at 11/29/20 0756  . hydrOXYzine (ATARAX/VISTARIL) tablet 25 mg  25 mg Oral TID PRN Ileene Musa E, PA   25 mg at 11/28/20 2040  . magnesium hydroxide (MILK OF MAGNESIA) suspension 30 mL  30 mL Oral Daily PRN Mallie Darting, NP      . Derrill Memo ON 12/27/2020] paliperidone (INVEGA SUSTENNA) injection 234 mg  234 mg Intramuscular Once Lavella Hammock, MD      . risperiDONE (RISPERDAL) tablet 2 mg  2 mg Oral Daily Sharma Covert, MD    2 mg at 11/29/20 0756  . risperiDONE (RISPERDAL) tablet 6 mg  6 mg Oral QHS Sharma Covert, MD      . thiamine tablet 100 mg  100 mg Oral Daily Merlyn Lot E, NP   100 mg at 11/29/20 3825   Or  . thiamine (B-1) injection 100 mg  100 mg Intravenous Daily Merlyn Lot E, NP   100 mg at 11/27/20 0745   PTA Medications: Medications Prior to Admission  Medication Sig Dispense Refill Last Dose  . ARIPiprazole (ABILIFY) 10 MG tablet Take 1 tablet (10 mg total) by mouth daily for 7 days. 7 tablet 0     Patient Stressors: Financial difficulties Substance abuse  Patient Strengths: Average or above average intelligence Motivation for treatment/growth  Treatment Modalities: Medication Management, Group therapy, Case management,  1 to 1 session with clinician, Psychoeducation, Recreational therapy.   Physician Treatment Plan for Primary Diagnosis: Schizophrenia ([place]) Long Term Goal(s): Improvement in symptoms so as ready for discharge   Short Term Goals: Ability to identify changes in lifestyle to reduce recurrence of condition will improve Ability to verbalize feelings will improve Ability to disclose and discuss suicidal ideas Ability to demonstrate self-control will improve Ability to identify and develop effective  coping behaviors will improve Ability to maintain clinical measurements within normal limits will improve Compliance with prescribed medications will improve  Medication Management: Evaluate patient's response, side effects, and tolerance of medication regimen.  Therapeutic Interventions: 1 to 1 sessions, Unit Group sessions and Medication administration.  Evaluation of Outcomes: Adequate for Discharge  Physician Treatment Plan for Secondary Diagnosis: Principal Problem:   Schizophrenia ([place]) Active Problems:   Cannabis abuse with psychotic disorder, with delusions ([place])   Psychosis ([place])   Cocaine abuse ([place])   Cocaine abuse with cocaine-induced mood disorder  ([place])  Long Term Goal(s): Improvement in symptoms so as ready for discharge   Short Term Goals: Ability to identify changes in lifestyle to reduce recurrence of condition will improve Ability to verbalize feelings will improve Ability to disclose and discuss suicidal ideas Ability to demonstrate self-control will improve Ability to identify and develop effective coping behaviors will improve Ability to maintain clinical measurements within normal limits will improve Compliance with prescribed medications will improve     Medication Management: Evaluate patient's response, side effects, and tolerance of medication regimen.  Therapeutic Interventions: 1 to 1 sessions, Unit Group sessions and Medication administration.  Evaluation of Outcomes: Adequate for Discharge   RN Treatment Plan for Primary Diagnosis: Schizophrenia ([place]) Long Term Goal(s): Knowledge of disease and therapeutic regimen to maintain health will improve  Short Term Goals: Ability to remain free from injury will improve, Ability to participate in decision making will improve, Ability to verbalize feelings will improve, Ability to disclose and discuss suicidal ideas, Ability to identify and develop effective coping behaviors will improve and Compliance with prescribed medications will improve  Medication Management: RN will administer medications as ordered by provider, will assess and evaluate patient's response and provide education to patient for prescribed medication. RN will report any adverse and/or side effects to prescribing provider.  Therapeutic Interventions: 1 on 1 counseling sessions, Psychoeducation, Medication administration, Evaluate responses to treatment, Monitor vital signs and CBGs as ordered, Perform/monitor CIWA, COWS, AIMS and Fall Risk screenings as ordered, Perform wound care treatments as ordered.  Evaluation of Outcomes: Adequate for Discharge   LCSW Treatment Plan for Primary Diagnosis:  Schizophrenia [facility]) Long Term Goal(s): Safe transition to appropriate next level of care at discharge, Engage patient in therapeutic group addressing interpersonal concerns.  Short Term Goals: Engage patient in aftercare planning with referrals and resources, Increase social support, Increase ability to appropriately verbalize feelings, Increase emotional regulation, Facilitate acceptance of mental health diagnosis and concerns and Identify triggers associated with mental health/substance abuse issues  Therapeutic Interventions: Assess for all discharge needs, 1 to 1 time with Social worker, Explore available resources and support systems, Assess for adequacy in community support network, Educate family and significant other(s) on suicide prevention, Complete Psychosocial Assessment, Interpersonal group therapy.  Evaluation of Outcomes: Adequate for Discharge   Progress in Treatment: Attending groups: Yes. Participating in groups: No. Taking medication as prescribed: Yes. Toleration medication: Yes. Family/Significant other contact made: Yes, individual(s) contacted:  Pt's mother. Patient understands diagnosis: Yes. Discussing patient identified problems/goals with staff: Yes. Medical problems stabilized or resolved: Yes. Denies suicidal/homicidal ideation: Yes. Issues/concerns per patient self-inventory: No.   New problem(s) identified: No, Describe:  None   New Short Term/Long Term Goal(s): medication stabilization, elimination of SI thoughts, development of comprehensive mental wellness plan.   Patient Goals: "I dont know yet"   Discharge Plan or Barriers: Patient is to return to [facility] to participate in their shelter program. Patient is  to follow up with [facility] for medication management.  Reason for Continuation of Hospitalization: Medication stabilization  Estimated Length of Stay: Patient to be discharged on this date.  Attendees: Patient:  11/20/2020    Physician: 11/20/2020   Nursing:  11/20/2020   RN Care Manager: 11/20/2020   Social Worker: Darletta Moll, LCSW 11/20/2020   Recreational Therapist:  11/20/2020   Other:  11/20/2020   Other:  11/20/2020   Other: 11/20/2020     Scribe for Treatment Team: Vassie Moselle, LCSW 11/29/2020 10:55 AM

## 2020-11-29 NOTE — BHH Group Notes (Signed)
The focus of this group is to help patients establish daily goals to achieve during treatment and discuss how the patient can incorporate goal setting into their daily lives to aide in recovery.  Pt did not attend group 

## 2020-11-29 NOTE — Progress Notes (Signed)
RN met with pt and reviewed pt's discharge instructions.  Pt verbalized understanding of discharge instructions and pt did not have any questions. RN reviewed and provided pt with a copy of SRA, AVS and Transition Record.  RN returned pt's belongings to pt.  Pt voiced that he had no concerns. Patient discharged to the lobby without incident.

## 2020-12-18 ENCOUNTER — Telehealth: Payer: Self-pay | Admitting: *Deleted

## 2020-12-18 ENCOUNTER — Encounter: Payer: Self-pay | Admitting: *Deleted

## 2020-12-18 NOTE — Congregational Nurse Program (Signed)
  Dept: [facility] Nurse Program Note  Date of Encounter: 12/18/2020  Past Medical History: Past Medical History:  Diagnosis Date  . Hernia, inguinal, right   . Inguinal hernia    right  . Psychiatric illness   . Schizophrenia [facility])     Encounter Details:  CNP Questionnaire - 12/18/20 1301      Questionnaire   Do you give verbal consent to treat you today? Yes    Visit Setting Church or Organization    Location Patient Served At [facility]    Patient Status Homeless   has room at [facility] Provider Yes    Insurance Medicaid    Intervention Support    Housing/Utilities No permanent housing          Client came to [facility] requesting help getting Risperdal. He reports he was recently in [facility] and received an invega injection. Client denies si or hi plans. Client reports he had medicaid at one time but was not sure he still did. [place] and they do not have insurance info. [facility] shows medicaid. Went to lobby to discuss benefits and medication. Client is not in lobby or front of building.  Kaliyan Osbourn W RN CN [phone]

## 2020-12-18 NOTE — Telephone Encounter (Signed)
Contacted pharmacy to inquire about insurance for medication

## 2021-01-15 DIAGNOSIS — Z720 Tobacco use: Secondary | ICD-10-CM | POA: Insufficient documentation

## 2021-02-09 ENCOUNTER — Encounter (HOSPITAL_COMMUNITY): Payer: Self-pay | Admitting: Psychiatry

## 2021-03-11 ENCOUNTER — Other Ambulatory Visit: Payer: Self-pay

## 2021-03-11 ENCOUNTER — Emergency Department (HOSPITAL_COMMUNITY)
Admission: EM | Admit: 2021-03-11 | Discharge: 2021-03-11 | Disposition: A | Discharge status: Other Acute Inpatient Hospital | Payer: Medicaid Other | Attending: Emergency Medicine | Admitting: Emergency Medicine

## 2021-03-11 ENCOUNTER — Ambulatory Visit (HOSPITAL_COMMUNITY)
Admission: EM | Admit: 2021-03-11 | Discharge: 2021-03-12 | Disposition: A | Discharge status: Home / Self Care | Payer: Medicaid Other | Attending: Psychiatry | Admitting: Psychiatry

## 2021-03-11 ENCOUNTER — Encounter (HOSPITAL_COMMUNITY): Payer: Self-pay | Admitting: Emergency Medicine

## 2021-03-11 DIAGNOSIS — Z818 Family history of other mental and behavioral disorders: Secondary | ICD-10-CM | POA: Insufficient documentation

## 2021-03-11 DIAGNOSIS — F1290 Cannabis use, unspecified, uncomplicated: Secondary | ICD-10-CM | POA: Insufficient documentation

## 2021-03-11 DIAGNOSIS — Z20822 Contact with and (suspected) exposure to covid-19: Secondary | ICD-10-CM | POA: Insufficient documentation

## 2021-03-11 DIAGNOSIS — F209 Schizophrenia, unspecified: Secondary | ICD-10-CM | POA: Diagnosis not present

## 2021-03-11 DIAGNOSIS — F17210 Nicotine dependence, cigarettes, uncomplicated: Secondary | ICD-10-CM | POA: Diagnosis not present

## 2021-03-11 DIAGNOSIS — F259 Schizoaffective disorder, unspecified: Secondary | ICD-10-CM | POA: Insufficient documentation

## 2021-03-11 DIAGNOSIS — R45851 Suicidal ideations: Secondary | ICD-10-CM | POA: Insufficient documentation

## 2021-03-11 DIAGNOSIS — F32A Depression, unspecified: Secondary | ICD-10-CM | POA: Diagnosis present

## 2021-03-11 DIAGNOSIS — Z79899 Other long term (current) drug therapy: Secondary | ICD-10-CM | POA: Insufficient documentation

## 2021-03-11 DIAGNOSIS — R443 Hallucinations, unspecified: Secondary | ICD-10-CM

## 2021-03-11 DIAGNOSIS — F1210 Cannabis abuse, uncomplicated: Secondary | ICD-10-CM

## 2021-03-11 LAB — COMPREHENSIVE METABOLIC PANEL
ALT: 15 U/L (ref 0–44)
AST: 27 U/L (ref 15–41)
Albumin: 4.7 g/dL (ref 3.5–5.0)
Alkaline Phosphatase: 62 U/L (ref 38–126)
Anion gap: 15 (ref 5–15)
BUN: 14 mg/dL (ref 6–20)
CO2: 21 mmol/L — ABNORMAL LOW (ref 22–32)
Calcium: 10 mg/dL (ref 8.9–10.3)
Chloride: 102 mmol/L (ref 98–111)
Creatinine, Ser: 0.99 mg/dL (ref 0.61–1.24)
GFR, Estimated: >60 mL/min (ref >60)
Glucose, Bld: 94 mg/dL (ref 70–99)
Potassium: 3.7 mmol/L (ref 3.5–5.1)
Sodium: 138 mmol/L (ref 135–145)
Total Bilirubin: 1 mg/dL (ref 0.3–1.2)
Total Protein: 8.1 g/dL (ref 6.5–8.1)

## 2021-03-11 LAB — RAPID URINE DRUG SCREEN, HOSP PERFORMED
Amphetamines: NOT DETECTED
Barbiturates: NOT DETECTED
Benzodiazepines: NOT DETECTED
Cocaine: NOT DETECTED
Opiates: NOT DETECTED
Tetrahydrocannabinol: POSITIVE — AB

## 2021-03-11 LAB — CBC
HCT: 48.8 % (ref 39.0–52.0)
Hemoglobin: 16.6 g/dL (ref 13.0–17.0)
MCH: 27.9 pg (ref 26.0–34.0)
MCHC: 34 g/dL (ref 30.0–36.0)
MCV: 81.9 fL (ref 80.0–100.0)
Platelets: 366 10*3/uL (ref 150–400)
RBC: 5.96 MIL/uL — ABNORMAL HIGH (ref 4.22–5.81)
RDW: 12.9 % (ref 11.5–15.5)
WBC: 7.3 10*3/uL (ref 4.0–10.5)
nRBC: 0 % (ref 0.0–0.2)

## 2021-03-11 LAB — ACETAMINOPHEN LEVEL: Acetaminophen (Tylenol), Serum: <10 ug/mL — ABNORMAL LOW (ref 10–30)

## 2021-03-11 LAB — RESP PANEL BY RT-PCR (FLU A&B, COVID) ARPGX2
Influenza A by PCR: NEGATIVE
Influenza B by PCR: NEGATIVE
SARS Coronavirus 2 by RT PCR: NEGATIVE

## 2021-03-11 LAB — SALICYLATE LEVEL: Salicylate Lvl: <7 mg/dL — ABNORMAL LOW (ref 7.0–30.0)

## 2021-03-11 LAB — ETHANOL: Alcohol, Ethyl (B): <10 mg/dL (ref <10)

## 2021-03-11 MED ORDER — ONDANSETRON HCL 4 MG PO TABS: 4.0000 mg | ORAL_TABLET | Freq: Three times a day (TID) | ORAL | Status: DC | PRN

## 2021-03-11 MED ORDER — NICOTINE 21 MG/24HR TD PT24
21.0000 mg | MEDICATED_PATCH | Freq: Every day | TRANSDERMAL | Status: DC
  Administered 2021-03-11: 21 mg via TRANSDERMAL
  Filled 2021-03-11: qty 1

## 2021-03-11 MED ORDER — GABAPENTIN 300 MG PO CAPS
300.0000 mg | ORAL_CAPSULE | Freq: Three times a day (TID) | ORAL | Status: DC
  Filled 2021-03-11: qty 1

## 2021-03-11 MED ORDER — ACETAMINOPHEN 325 MG PO TABS: 650.0000 mg | ORAL_TABLET | ORAL | Status: DC | PRN

## 2021-03-11 MED ORDER — RISPERIDONE 2 MG PO TABS
2.0000 mg | ORAL_TABLET | Freq: Every day | ORAL | Status: DC
Start: 2021-03-11 — End: 2021-03-11

## 2021-03-11 MED ORDER — BENZTROPINE MESYLATE 1 MG PO TABS
0.5000 mg | ORAL_TABLET | Freq: Two times a day (BID) | ORAL | Status: DC | PRN
  Filled 2021-03-11: qty 1

## 2021-03-11 MED ORDER — ALUM & MAG HYDROXIDE-SIMETH 200-200-20 MG/5ML PO SUSP: 30.0000 mL | Freq: Four times a day (QID) | ORAL | Status: DC | PRN

## 2021-03-11 MED ORDER — BUSPIRONE HCL 10 MG PO TABS
10.0000 mg | ORAL_TABLET | Freq: Two times a day (BID) | ORAL | Status: DC
  Filled 2021-03-11: qty 1

## 2021-03-11 MED ORDER — RISPERIDONE 3 MG PO TABS
6.0000 mg | ORAL_TABLET | Freq: Every day | ORAL | Status: DC
  Administered 2021-03-11: 6 mg via ORAL
  Filled 2021-03-11: qty 2

## 2021-03-11 MED ORDER — DIVALPROEX SODIUM ER 500 MG PO TB24
750.0000 mg | ORAL_TABLET | Freq: Every day | ORAL | Status: DC
  Filled 2021-03-11: qty 1

## 2021-03-11 NOTE — ED Notes (Signed)
Pt has been wanded by security. 

## 2021-03-11 NOTE — Progress Notes (Signed)
Received Carrel from [facility], he was cooperative with the admission process. The skin assessment was completed with [place], MHT. He was escorted to the unit and oriented to his new environment. He received nourishments per his request. He denied feeling suicidal,homicidal, anxious and depressed. He endorsed hearing voices at intervals.

## 2021-03-11 NOTE — ED Notes (Signed)
Pt transport provided with emtala paper work, and pts belongings, face sheet and ticket to ride.

## 2021-03-11 NOTE — ED Provider Notes (Addendum)
Pt signed out by Dr. Tomi Bamberger pending labs.  Pt is medically clear.  Diet and meds ordered.   Tyler Pence, MD 03/11/21 1534   TTS eval complete.  Inpatient psych admission recommended.  Pt to be transferred to [facility] to wait for a bed at [facility] per Leandro Reasoner, NP.  EMTALA form completed.  Pt is stable for transfer.     T[facility] Pence, MD 03/11/21 2035

## 2021-03-11 NOTE — BH Assessment (Addendum)
Comprehensive Clinical Assessment (CCA) Note  03/11/2021 [facility] [id number]  DISPOSITION: Gave clinical report to Tyler Reasoner, NP who determined Pt meets criteria for inpatient psychiatric treatment. [facility] [place] [facility], confirmed adult unit is at capacity. Tyler Reasoner, NP recommends Pt be transferred to [facility] for continuous assessment while awaiting inpatient placement. Confirmed [facility] availability at [facility]. Notified Dr. Isla [facility] and Tyler Meres, RN of recommendation.  The patient demonstrates the following risk factors for suicide: Chronic risk factors for suicide include: psychiatric disorder of schizophrenia and substance use disorder. Acute risk factors for suicide include: unemployment and homelessness. Protective factors for this patient include: hope for the future. Considering these factors, the overall suicide risk at this point appears to be high. Patient is not appropriate for outpatient follow up.  Therefore, a 1:1 sister for suicide precautions is recommended. The EDP has been informed through secure chat.  [facility] 03/11/2021 in [facility]Dis[facility]) from 11/19/2020 in [facility] from 11/02/2020 in [facility] CATEGORY High Risk Error: Question 2 not populated Error: Question 6 not populated     Pt is a 25 year old male who presents unaccompanied to [facility] reporting auditory and visual hallucinations and suicidal ideation. Pt has a diagnosis of schizophrenia and states he has not taken psychiatric medications in weeks. Pt appears to be thought blocking and responding to internal stimuli. He is a poor historian, giving brief responses with significant delay. He also gives answers that are not consistent with information in Pt's medical record.  Pt describes his mood as "instable" and Pt acknowledges symptoms  including crying spells, social withdrawal, loss of interest in usual pleasures, fatigue, decreased concentration, and decreased sleep. He reports current suicidal ideation with no clear plan. He denies history of suicide attempts. He denies intentional self injurious behaviors. Pt reports he is hearing and seeing things but is unable to elaborate. He denies current homicidal ideation or history of violence. Pt denies alcohol or other substance use, however Pt's medical record indicates a history of using cocaine and marijuana. Pt's urine drug screen is in process.  Pt reports he is homeless and Pt's medical record indicates chronic homelessness for the past three years. He cannot identify any family or friends who are currently supportive. He denies history of abuse or trauma. He denies current legal problems. He denies access to firearms.   Pt states he has no outpatient mental health providers. He says he has taken Risperdal in the past. He denies any history of inpatient psychiatric treatment, however [facility]. He presented with symptoms similar to tonight's presentation.  Pt is dressed in White scrubs, alert and oriented x4. Pt speaks in a soft tone, at low volume and slow pace. Motor behavior appears normal. Eye contact is intermittent. Pt's mood is depressed and affect is blunted. Thought process is coherent. Pt's insight is limited and judgment is impaired. Pt was calm and cooperative throughout assessment. He states he is willing to sign voluntarily into a psychiatric facility.   Chief Complaint:  Chief Complaint  Patient presents with  . Suicidal   Visit Diagnosis: F20.9 Schizophrenia   CCA Screening, Triage and Referral (STR)  Patient Reported Information How did you hear about [place]? Legal System  Referral name: [facility]  Referral phone number: No data  recorded  Whom do you see  for routine medical problems? I don't have a doctor  Practice/Facility Name: No data recorded Practice/Facility Phone Number: No data recorded Name of Contact: No data recorded Contact Number: No data recorded Contact Fax Number: No data recorded Prescriber Name: No data recorded Prescriber Address (if known): No data recorded  What Is the Reason for Your Visit/Call Today? Patient was brought to the [facility] by the police for psychiatric evaluation  How Long Has This Been Causing You Problems? 1-6 months  What Do You Feel Would Help You the Most Today? Therapy; Medication   Have You Recently Been in Any Inpatient Treatment (White/Detox/Crisis White/28-Day Program)? No  Name/Location of Program/White:No data recorded How Long Were You There? No data recorded When Were You Discharged? No data recorded  Have You Ever Received Services From [facility] Before? Yes  Who Do You See at [facility]? Has been admitted to [facility] 2-3 times in the past   Have You Recently Had Any Thoughts About [facility]? No  Are You Planning to Commit Suicide/Harm Yourself At This time? No   Have you Recently Had Thoughts About [facility]? No  Explanation: No data recorded  Have You Used Any Alcohol or Drugs in the Past 24 Hours? Yes  How Long Ago Did You Use Drugs or Alcohol? 0000 (UTA)  What Did You Use and How Much? Family reports that patient uses marijuana on a regular basis   Do You Currently Have a Therapist/Psychiatrist? No  Name of Therapist/Psychiatrist: No data recorded  Have You Been Recently Discharged From Any Office Practice or Programs? No  Explanation of Discharge From Practice/Program: No data recorded    CCA Screening Triage Referral Assessment Type of Contact: Face-to-Face  Is this Initial or Reassessment? No data recorded Date Telepsych consult ordered in [facility]:  11/18/2020  Time Telepsych consult ordered in [facility]:  1335   Patient Reported  Information Reviewed? Yes  Patient Left Without Being Seen? No data recorded Reason for Not Completing Assessment: No data recorded  Collateral Involvement: TTS was able to contact patient's father for collateral information as documented in narrative   Does Patient Have a [place]? No data recorded Name and Contact of Legal Guardian: No data recorded If Minor and Not Living with Parent(s), Who has Custody? No data recorded Is CPS involved or ever been involved? Never  Is APS involved or ever been involved? Never   Patient Determined To Be At Risk for Harm To Self or Others Based on Review of Patient Reported Information or Presenting Complaint? No  Method: No data recorded Availability of Means: No data recorded Intent: No data recorded Notification Required: No data recorded Additional Information for Danger to Others Potential: No data recorded Additional Comments for Danger to Others Potential: No data recorded Are There Guns or Other Weapons in Your Home? No data recorded Types of Guns/Weapons: No data recorded Are These Weapons Safely Secured?                            No data recorded Who Could Verify You Are Able To Have These Secured: No data recorded Do You Have any Outstanding Charges, Pending Court Dates, Parole/Probation? No data recorded Contacted To Inform of Risk of Harm To Self or Others: No data recorded  [facility] of Assessment: [facility]   Does Patient Present under Involuntary Commitment? No  IVC Papers Initial File Date:  No data recorded  [place] of Residence: [place]   Patient Currently Receiving the Following Services: Not Receiving Services   Determination of Need: Emergent (2 hours)   Options For Referral: Other: Comment (Continuous Observation)     CCA Biopsychosocial Intake/Chief Complaint:  Pt has diagnosis of schizophrenia and is not taking medication. He states he is experiencing auditory and visual hallucinations.  He reports suicidal ideation with no plan.  Current Symptoms/Problems: Pt appears to be thought blocking and responding to internal stimuli.   Patient Reported Schizophrenia/Schizoaffective Diagnosis in Past: Yes   Strengths: UTA  Preferences: Patient has no preferences that require accommodation  Abilities: UTA   Type of Services Patient Feels are Needed: Pt does not know   Initial Clinical Notes/Concerns: Pt appears to be thought blocking and is a poor historian.   Mental Health Symptoms Depression:  Change in energy/activity; Difficulty Concentrating; Sleep (too much or little); Tearfulness   Duration of Depressive symptoms: Greater than two weeks   Mania:  Change in energy/activity   Anxiety:   Difficulty concentrating; Sleep   Psychosis:  Grossly disorganized or catatonic behavior; Hallucinations   Duration of Psychotic symptoms: Greater than six months   Trauma:  None   Obsessions:  None   Compulsions:  None   Inattention:  None   Hyperactivity/Impulsivity:  N/A   Oppositional/Defiant Behaviors:  None   Emotional Irregularity:  None   Other Mood/Personality Symptoms:  NA    Mental Status Exam Appearance and self-care  Stature:  Average   Weight:  Thin   Clothing:  Disheveled   Grooming:  Neglected   Cosmetic use:  None   Posture/gait:  Normal   Motor activity:  Slowed   Sensorium  Attention:  Confused   Concentration:  Variable   Orientation:  Object; Person; Place; Time; Situation   Recall/memory:  Defective in Remote   Affect and Mood  Affect:  Blunted; Flat   Mood:  Depressed   Relating  Eye contact:  Fleeting   Facial expression:  Depressed   Attitude toward examiner:  Cooperative; Guarded   Thought and Language  Speech flow: Paucity; Slow   Thought content:  Appropriate to Mood and Circumstances   Preoccupation:  None   Hallucinations:  Visual; Auditory   Organization:  No data recorded  [employer] of Knowledge:  Average   Intelligence:  Average   Abstraction:  Normal   Judgement:  Impaired   Reality Testing:  Adequate   Insight:  Lacking   Decision Making:  Only simple   Social Functioning  Social Maturity:  Isolates   Social Judgement:  "Games developer"   Stress  Stressors:  Housing; Teacher, music Ability:  Deficient supports   Skill Deficits:  Self-care; Decision making   Supports:  Support needed     Religion: Religion/Spirituality Are You A Religious Person?: Yes What is Your Religious Affiliation?: Christian How Might This Affect Treatment?: NA  Leisure/Recreation: Leisure / Recreation Do You Have Hobbies?: Yes Leisure and Hobbies: Reading about history  Exercise/Diet: Exercise/Diet Do You Exercise?: No Have You Gained or Lost A Significant Amount of Weight in the Past Six Months?: No Do You Follow a Special Diet?: No Do You Have Any Trouble Sleeping?: Yes Explanation of Sleeping Difficulties: Pt reports poor sleep   CCA Employment/Education Employment/Work Situation: Employment / Work Situation Employment situation: Unemployed Patient's job has been impacted by current illness: No What is the longest time patient has a held a  job?: N/A Where was the patient employed at that time?: N/A Has patient ever been in the [employer]?: No  Education: Education Is Patient Currently Attending School?: No Last Grade Completed: 11 Did Teacher, adult education From [employer]?: No Did You Nutritional therapist?: No Did [place]?: No Did You Have Any Special Interests In School?: History Did You Have An Individualized Education Program (IIEP): No Did You Have Any Difficulty At School?: No Patient's Education Has Been Impacted by Current Illness: No   CCA Family/Childhood History Family and Relationship History: Family history Marital status: Single What is your sexual orientation?: Heterosexual Has your sexual activity been affected by  drugs, alcohol, medication, or emotional stress?: NA Does patient have children?: Yes How many children?: 1 How is patient's relationship with their children?: Pt reports having a 29 year old daughter who lives with her mother  Childhood History:  Childhood History By whom was/is the patient raised?: Mother Description of patient's relationship with caregiver when they were a child: Patient was closest to his mother growing up, but later went to live with his father Patient's description of current relationship with people who raised him/her: "Okay" How were you disciplined when you got in trouble as a child/adolescent?: unable to assess Does patient have siblings?: Yes Number of Siblings: 5 Description of patient's current relationship with siblings: "Fine" Did patient suffer any verbal/emotional/physical/sexual abuse as a child?: No Did patient suffer from severe childhood neglect?: No Has patient ever been sexually abused/assaulted/raped as an adolescent or adult?: No Was the patient ever a victim of a crime or a disaster?: No Witnessed domestic violence?: No Has patient been affected by domestic violence as an adult?: No  Child/Adolescent Assessment:     CCA Substance Use Alcohol/Drug Use: Alcohol / Drug Use Pain Medications: See MAR Prescriptions: See MAR Over the Counter: See MAR History of alcohol / drug use?: Yes Longest period of sobriety (when/how long): none reported Negative Consequences of Use: Financial,Personal relationships Substance #1 Name of Substance 1: Cocaine 1 - Age of First Use: Unknown 1 - Amount (size/oz): Unknown 1 - Frequency: Unknown 1 - Duration: Unknown 1 - Last Use / Amount: Unknown 1 - Method of Aquiring: Unknown 1- Route of Use: Unknown Substance #2 Name of Substance 2: Marijuana 2 - Age of First Use: Unknown 2 - Amount (size/oz): Unknown 2 - Frequency: Unknown 2 - Duration: Unknown 2 - Last Use / Amount: Unknown 2 - Method of  Aquiring: Unknown 2 - Route of Substance Use: Unknown                     ASAM's:  Six Dimensions of Multidimensional Assessment  Dimension 1:  Acute Intoxication and/or Withdrawal Potential:   Dimension 1:  Description of individual's past and current experiences of substance use and withdrawal: Patient does not report experiencing any complications with withdrawal symptoms  Dimension 2:  Biomedical Conditions and Complications:   Dimension 2:  Description of patient's biomedical conditions and  complications: Patient does not report any medical complications that are exacerbated by his marijuana use  Dimension 3:  Emotional, Behavioral, or Cognitive Conditions and Complications:  Dimension 3:  Description of emotional, behavioral, or cognitive conditions and complications: Patient is often non-compliant with taking his mental health medications and uses  Dimension 4:  Readiness to Change:  Dimension 4:  Description of Readiness to Change criteria: Patient has a history of non-compliance with treatment and has shown resistance to change especially concerning his  drug use.  He does not seem to understand to relationship between his drug use and complications with his mental illness  Dimension 5:  Relapse, Continued use, or Continued Problem Potential:  Dimension 5:  Relapse, continued use, or continued problem potential critiera description: Patient has no significant period of abstinence and  Dimension 6:  Recovery/Living Environment:  Dimension 6:  Recovery/Iiving environment criteria description: Homeless  ASAM Severity Score: ASAM's Severity Rating Score: 12  ASAM Recommended Level of Treatment: ASAM Recommended Level of Treatment: Level II Intensive Outpatient Treatment   Substance use Disorder (SUD) Substance Use Disorder (SUD)  Checklist Symptoms of Substance Use: Continued use despite having a persistent/recurrent physical/psychological problem caused/exacerbated by use,Continued  use despite persistent or recurrent social, interpersonal problems, caused or exacerbated by use,Social, occupational, recreational activities given up or reduced due to use,Large amounts of time spent to obtain, use or recover from the substance(s)  Recommendations for Services/Supports/Treatments: Recommendations for Services/Supports/Treatments Recommendations For Services/Supports/Treatments: Inpatient Hospitalization  DSM5 Diagnoses: Patient Active Problem List   Diagnosis Date Noted  . Schizophrenia ([place]) 11/19/2020  . Cocaine abuse ([place]) 02/19/2018  . Cocaine abuse with cocaine-induced mood disorder ([place]) 02/19/2018  . Psychosis ([place]) 11/27/2017  . Cannabis abuse with psychotic disorder, with delusions ([place]) 11/26/2017  . Schizophrenia, unspecified ([place]) 11/26/2017    Patient Centered Plan: Patient is on the following Treatment Plan(s):  Depression   Referrals to Alternative Service(s): Referred to Alternative Service(s):   Place:   Date:   Time:    Referred to Alternative Service(s):   Place:   Date:   Time:    Referred to Alternative Service(s):   Place:   Date:   Time:    Referred to Alternative Service(s):   Place:   Date:   Time:     Evelena Peat, [employer]

## 2021-03-11 NOTE — ED Provider Notes (Signed)
[facility] EMERGENCY DEPARTMENT Provider Note   CSN: [id number] Arrival date & time: 03/11/21  1400     History Chief Complaint  Patient presents with  . Suicidal    Tyler White is a 25 y.o. male.  HPI   Patient presents to the ED with complaints of depression and suicidal ideation.  Patient has a history of depression and schizophrenia.  He has been admitted to psychiatric hospitals previously.  Patient is [facility] to [facility] as well as a [facility] facility in January.  Patient states in the last few days he had trouble with increasing depression.  He has had suicidal thoughts although no specific plan.  He also is hearing voices.  Patient feels like he needs to be admitted to the hospital.  Past Medical History:  Diagnosis Date  . Hernia, inguinal, right   . Inguinal hernia    right  . Psychiatric illness   . Schizophrenia [facility])     Patient Active Problem List   Diagnosis Date Noted  . Schizophrenia ([place]) 11/19/2020  . Cocaine abuse ([place]) 02/19/2018  . Cocaine abuse with cocaine-induced mood disorder ([place]) 02/19/2018  . Psychosis ([place]) 11/27/2017  . Cannabis abuse with psychotic disorder, with delusions ([place]) 11/26/2017  . Schizophrenia, unspecified ([place]) 11/26/2017    History reviewed. No pertinent surgical history.     Family History  Problem Relation Age of Onset  . Psychiatric Illness Mother   . Hypertension Sister     Social History   Tobacco Use  . Smoking status: Current Every Day Smoker    Packs/day: 0.50    Years: 5.00    Pack years: 2.50    Types: Cigarettes  . Smokeless tobacco: Never Used  Vaping Use  . Vaping Use: Never used  Substance Use Topics  . Alcohol use: Yes  . Drug use: Yes    Types: Marijuana    Home Medications Prior to Admission medications   Medication Sig Start Date End Date Taking? Authorizing Provider  benztropine (COGENTIN) 0.5 MG tablet Take 1 tablet (0.5 mg  total) by mouth 2 (two) times daily as needed for tremors. 11/29/20   Ethelene Hal, NP  busPIRone (BUSPAR) 10 MG tablet Take 1 tablet (10 mg total) by mouth 2 (two) times daily. 11/29/20   Ethelene Hal, NP  divalproex (DEPAKOTE ER) 250 MG 24 hr tablet Take 3 tablets (750 mg total) by mouth at bedtime. 11/29/20   Ethelene Hal, NP  gabapentin (NEURONTIN) 300 MG capsule Take 1 capsule (300 mg total) by mouth 3 (three) times daily. 11/29/20   Ethelene Hal, NP  hydrOXYzine (ATARAX/VISTARIL) 25 MG tablet Take 1 tablet (25 mg total) by mouth 3 (three) times daily as needed for anxiety. 11/29/20   Ethelene Hal, NP  paliperidone (INVEGA SUSTENNA) 234 MG/1.5ML SUSY injection Inject 234 mg into the muscle once for 1 dose. Next 234 mg IM injection is due on 12-20-2020 12/20/20 12/20/20  Ethelene Hal, NP  risperiDONE (RISPERDAL) 2 MG tablet Take 1 tablet (2 mg total) by mouth daily. 11/30/20   Ethelene Hal, NP  risperiDONE (RISPERDAL) 3 MG tablet Take 2 tablets (6 mg total) by mouth at bedtime. 11/29/20   Ethelene Hal, NP    Allergies    Patient has no known allergies.  Review of Systems   Review of Systems  All other systems reviewed and are negative.   Physical Exam Updated Vital Signs BP (!) 140/103 (  BP Location: Left Arm)   Pulse (!) 112   Temp 98.1 F (36.7 C)   Resp 18   SpO2 99%   Physical Exam Vitals and nursing note reviewed.  Constitutional:      General: He is not in acute distress.    Appearance: He is well-developed.  HENT:     Head: Normocephalic and atraumatic.     Right Ear: External ear normal.     Left Ear: External ear normal.  Eyes:     General: No scleral icterus.       Right eye: No discharge.        Left eye: No discharge.     Conjunctiva/sclera: Conjunctivae normal.  Neck:     Trachea: No tracheal deviation.  Cardiovascular:     Rate and Rhythm: Normal rate and regular rhythm.  Pulmonary:      Effort: Pulmonary effort is normal. No respiratory distress.     Breath sounds: Normal breath sounds. No stridor. No wheezing or rales.  Abdominal:     General: Bowel sounds are normal. There is no distension.     Palpations: Abdomen is soft.     Tenderness: There is no abdominal tenderness. There is no guarding or rebound.  Musculoskeletal:        General: No tenderness.     Cervical back: Neck supple.  Skin:    General: Skin is warm and dry.     Findings: No rash.  Neurological:     Mental Status: He is alert.     Cranial Nerves: No cranial nerve deficit (no facial droop, extraocular movements intact, no slurred speech).     Sensory: No sensory deficit.     Motor: No abnormal muscle tone or seizure activity.     Coordination: Coordination normal.  Psychiatric:        Mood and Affect: Mood is depressed. Affect is flat.        Speech: Speech is not delayed, slurred or tangential.        Behavior: Behavior is withdrawn.        Thought Content: Thought content includes suicidal ideation. Thought content does not include suicidal plan.     ED Results / Procedures / Treatments   Labs (all labs ordered are listed, but only abnormal results are displayed) Labs Reviewed  COMPREHENSIVE METABOLIC PANEL  ETHANOL  SALICYLATE LEVEL  ACETAMINOPHEN LEVEL  CBC  RAPID URINE DRUG SCREEN, HOSP PERFORMED    EKG None  Radiology No results found.  Procedures Procedures   Medications Ordered in ED Medications  benztropine (COGENTIN) tablet 0.5 mg (has no administration in time range)  busPIRone (BUSPAR) tablet 10 mg (has no administration in time range)  divalproex (DEPAKOTE ER) 24 hr tablet 750 mg (has no administration in time range)  gabapentin (NEURONTIN) capsule 300 mg (has no administration in time range)  risperiDONE (RISPERDAL) tablet 2 mg (has no administration in time range)  risperiDONE (RISPERDAL) tablet 6 mg (has no administration in time range)    ED Course  I have  reviewed the triage vital signs and the nursing notes.  Pertinent labs & imaging results that were available during my care of the patient were reviewed by me and considered in my medical decision making (see chart for details).     MDM Rules/Calculators/A&P                          Patient has history of psychiatric illness.  He presents with suicidal ideation and depression.  Patient also was having increasing hallucinations.  No signs of acute medical issues at this time.  Labs are pending.  We will plan on consultation with psychiatry services for their recommendations  Final Clinical Impression(s) / ED Diagnoses Final diagnoses:  Suicidal ideation  Depression, unspecified depression type  Hallucination      Dorie Rank, MD 03/11/21 1450

## 2021-03-11 NOTE — ED Triage Notes (Signed)
Pt states, "I am hearing voices and seeing things.  I am having thoughts of hurting myself."  Denies suicidal plan.

## 2021-03-11 NOTE — ED Notes (Signed)
Meds temporarily held as pharmd and pharm tech are review medications.

## 2021-03-11 NOTE — ED Notes (Addendum)
Secretary notified to call patient transport for this pt to go to [facility]

## 2021-03-11 NOTE — ED Notes (Signed)
Report given to michelle, rn at [facility]

## 2021-03-11 NOTE — ED Notes (Signed)
Attempted report to [facility]. rn informed that nurse will call pt back

## 2021-03-11 NOTE — ED Notes (Signed)
Pt belongings have been thoroughly inventoried and placed in locker #1.

## 2021-03-11 NOTE — ED Notes (Signed)
Pt is currently doing his TTS.

## 2021-03-12 ENCOUNTER — Telehealth (HOSPITAL_COMMUNITY): Payer: Self-pay | Admitting: Pediatrics

## 2021-03-12 MED ORDER — RISPERIDONE 2 MG PO TBDP
2.0000 mg | ORAL_TABLET | Freq: Two times a day (BID) | ORAL | Status: DC
  Administered 2021-03-12: 2 mg via ORAL
  Filled 2021-03-12: qty 14
  Filled 2021-03-12: qty 1

## 2021-03-12 MED ORDER — RISPERIDONE 2 MG PO TBDP: 2.0000 mg | ORAL_TABLET | Freq: Two times a day (BID) | ORAL | 0 refills | Status: DC

## 2021-03-12 MED ORDER — ACETAMINOPHEN 325 MG PO TABS: 650.0000 mg | ORAL_TABLET | Freq: Four times a day (QID) | ORAL | Status: DC | PRN

## 2021-03-12 MED ORDER — MAGNESIUM HYDROXIDE 400 MG/5ML PO SUSP: 30.0000 mL | Freq: Every day | ORAL | Status: DC | PRN

## 2021-03-12 MED ORDER — TRAZODONE HCL 50 MG PO TABS: 50.0000 mg | ORAL_TABLET | Freq: Every evening | ORAL | Status: DC | PRN

## 2021-03-12 MED ORDER — RISPERIDONE 2 MG PO TBDP: 2.0000 mg | ORAL_TABLET | Freq: Every day | ORAL | 0 refills | Status: DC

## 2021-03-12 MED ORDER — ALUM & MAG HYDROXIDE-SIMETH 200-200-20 MG/5ML PO SUSP: 30.0000 mL | ORAL | Status: DC | PRN

## 2021-03-12 MED ORDER — HYDROXYZINE HCL 25 MG PO TABS: 25.0000 mg | ORAL_TABLET | Freq: Three times a day (TID) | ORAL | 0 refills | Status: DC | PRN

## 2021-03-12 MED ORDER — HYDROXYZINE HCL 25 MG PO TABS: 25.0000 mg | ORAL_TABLET | Freq: Three times a day (TID) | ORAL | Status: DC | PRN

## 2021-03-12 NOTE — Progress Notes (Signed)
Patient information has been sent to [facility] via secure chat to review for potential admission. Patient meets inpatient criteria per Ernie Hew, MD .   Situation ongoing, CSW will continue to monitor progress.    Signed:  Durenda Hurt, MSW, [place], LCASA 03/12/2021 9:42 AM

## 2021-03-12 NOTE — ED Provider Notes (Signed)
FBC/OBS ASAP Discharge Summary  Date and Time: 03/12/2021 3:25 PM  Name: Tyler White  MRN:  [id number]   Discharge Diagnoses:  Final diagnoses:  Schizoaffective disorder, unspecified type ([place])  Suicidal ideation  Cannabis abuse    Subjective:  Patient interviewed bedside while eating breakfast. He is calm, cooperative and pleasant. He denies SI/HI. He denies AH  But reports VH of "shadows". Pt states that he has been living in an abandoned house for the last 8 months and admitted that "I just needed somewhere to stay". He denies SI intent or plan. He states that he feels safe for discharge. Is interested in speaking with SW for housing resources. No objective ssx of mania or psychosis; he does not appear to be RIS. After speaking with SW, patient is agreeable to go to the [facility]. 7 day sample and scripts provided for medications.   Stay Summary:  Tyler White is a 25y/o male who  presented voluntarily to [facility] for evaluation of visual and auditory hallucination as well as suicidal ideations on the morning of 3/14. [facility] was [facility] to [facility] for continuous assessment and stabilization. Patient was assessed face to face by this writer upon arrival to [facility]. Patient is alert and oriented X4. Patient is minimally cooperative with assessment, gives brief answers to assessment questions and unwilling to elaborate on follow up questions.   Patient reports a history of schizophrenia. He admits to auditory hallucination of "hearing voices." He states he does not know what the voices are saying to him but "they are affecting my life." He admits to visual hallucination of "shadows." He reports that he has not taken any of his psychotropic medication since he was last discharged from the hospital due to "my medicaid stopped working." He reports suicidal ideations of "thinking about killing myself but I don't have plans." He reports that suicidal thoughts started ~1 day  prior to his presentation to [facility]; he reports that suicidal ideation has improved since coming to the ED. He reports that he can not identify any stressors/triggers.   Patient is endorsing suicidal thoughts and reports that he is unable to contract for safety. When asked by this writer if he is a danger to self, patient responded "I can't say what's going to happen." He denies homicidal ideations and paranoia; he does not appear to have any delusions. He denies illicit drug use although his UDS is positive for THC. He denies alcohol use. He reports that he is currently homeless and unemployed.  .  Patient was restarted on medications and admitted for observation. Later in the morning, patient denied SI/HI/AVH and admitted presenting to the ED for respite from homelessness (See above). Patient requested [facility] rescue [facility]. 7 day samples and scripts provided.   Total Time spent with patient: 20 minutes  Past Psychiatric History: see H&P Past Medical History:  Past Medical History:  Diagnosis Date  . Hernia, inguinal, right   . Inguinal hernia    right  . Psychiatric illness   . Schizophrenia ([place])    No past surgical history on file. Family History:  Family History  Problem Relation Age of Onset  . Psychiatric Illness Mother   . Hypertension Sister    Family Psychiatric History: see H&P Social History:  Social History   Substance and Sexual Activity  Alcohol Use Yes     Social History   Substance and Sexual Activity  Drug Use Yes  . Types: Marijuana  Social History   Socioeconomic History  . Marital status: Single    Spouse name: Not on file  . Number of children: Not on file  . Years of education: Not on file  . Highest education level: Not on file  Occupational History  . Not on file  Tobacco Use  . Smoking status: Current Every Day Smoker    Packs/day: 0.50    Years: 5.00    Pack years: 2.50    Types: Cigarettes  . Smokeless tobacco: Never  Used  Vaping Use  . Vaping Use: Never used  Substance and Sexual Activity  . Alcohol use: Yes  . Drug use: Yes    Types: Marijuana  . Sexual activity: Yes    Birth control/protection: None  Other Topics Concern  . Not on file  Social History Narrative   ** Merged History Encounter **       ** Merged History Encounter **       Social Determinants of Health   Financial Resource Strain: Not on file  Food Insecurity: Not on file  Transportation Needs: Not on file  Physical Activity: Not on file  Stress: Not on file  Social Connections: Not on file   SDOH:  SDOH Screenings   Alcohol Screen: Low Risk   . Last Alcohol Screening Score (AUDIT): 0  Depression (PHQ2-9): Medium Risk  . PHQ-2 Score: 17  Financial Resource Strain: Not on file  Food Insecurity: Not on file  Housing: Not on file  Physical Activity: Not on file  Social Connections: Not on file  Stress: Not on file  Tobacco Use: High Risk  . Smoking Tobacco Use: Current Every Day Smoker  . Smokeless Tobacco Use: Never Used  Transportation Needs: Not on file    Has this patient used any form of tobacco in the last 30 days? (Cigarettes, Smokeless Tobacco, Cigars, and/or Pipes) Prescription not provided because: n/a  Current Medications:  Current Facility-Administered Medications  Medication Dose Route Frequency Provider Last Rate Last Admin  . acetaminophen (TYLENOL) tablet 650 mg  650 mg Oral Q6H PRN Ajibola, Ene A, NP      . alum & mag hydroxide-simeth (MAALOX/MYLANTA) 200-200-20 MG/5ML suspension 30 mL  30 mL Oral Q4H PRN Ajibola, Ene A, NP      . hydrOXYzine (ATARAX/VISTARIL) tablet 25 mg  25 mg Oral TID PRN Ajibola, Ene A, NP      . magnesium hydroxide (MILK OF MAGNESIA) suspension 30 mL  30 mL Oral Daily PRN Ajibola, Ene A, NP      . risperiDONE (RISPERDAL M-TABS) disintegrating tablet 2 mg  2 mg Oral BID Sharma Covert, MD   2 mg at 03/12/21 0951  . traZODone (DESYREL) tablet 50 mg  50 mg Oral QHS PRN  Ajibola, Ene A, NP       Current Outpatient Medications  Medication Sig Dispense Refill  . hydrOXYzine (ATARAX/VISTARIL) 25 MG tablet Take 1 tablet (25 mg total) by mouth 3 (three) times daily as needed for anxiety. 30 tablet 0  . risperiDONE (RISPERDAL M-TABS) 2 MG disintegrating tablet Take 1 tablet (2 mg total) by mouth daily. 30 tablet 0    PTA Medications: (Not in a hospital admission)   Musculoskeletal  Strength & Muscle Tone: within normal limits Gait & Station: normal Patient leans: N/A  Psychiatric Specialty Exam  Presentation  General Appearance: Appropriate for Environment; Casual  Eye Contact:Fair  Speech:Clear and Coherent; Other (comment) (increased latency of response)  Speech Volume:Decreased  Handedness:Right  Mood and Affect  Mood:Euthymic  Affect:Flat   Thought Process  Thought Processes:Coherent; Linear  Descriptions of Associations:Intact  Orientation:Full (Time, Place and Person)  Thought Content:WDL  Hallucinations:Hallucinations: Visual (VH of "shadows"; denies AH) Description of Visual Hallucinations: "shadows"  Ideas of Reference:None  Suicidal Thoughts:Suicidal Thoughts: No SI Passive Intent and/or Plan: Without Plan  Homicidal Thoughts:Homicidal Thoughts: No   Sensorium  Memory:Immediate Good; Remote Fair; Recent Fair  Judgment:Fair  Insight:Fair   Executive Functions  Concentration:Fair  Attention Span:Fair  [place]   Psychomotor Activity  Psychomotor Activity:Psychomotor Activity: Normal   Assets  Assets:Communication Skills; Desire for Improvement; Resilience; Physical Health   Sleep  Sleep:Sleep: Fair Number of Hours of Sleep: 6   Nutritional Assessment (For OBS and FBC admissions only) Has the patient had a weight loss or gain of 10 pounds or more in the last 3 months?: No Has the patient had a decrease in food intake/or appetite?: No Does the patient  have dental problems?: No Does the patient have eating habits or behaviors that may be indicators of an eating disorder including binging or inducing vomiting?: No Has the patient recently lost weight without trying?: No Has the patient been eating poorly because of a decreased appetite?: No Malnutrition Screening Tool Score: 0    Physical Exam  Physical Exam Constitutional:      Appearance: Normal appearance. He is normal weight.  HENT:     Head: Normocephalic and atraumatic.  Pulmonary:     Effort: Pulmonary effort is normal.  Neurological:     Mental Status: He is alert.    Review of Systems  Constitutional: Negative for chills and fever.  Eyes: Negative for discharge.  Respiratory: Negative for cough.   Cardiovascular: Negative for chest pain.  Gastrointestinal: Negative for abdominal pain.  Musculoskeletal: Negative for myalgias.  Neurological: Negative for headaches.  Psychiatric/Behavioral: Positive for hallucinations. Negative for depression and suicidal ideas.   Blood pressure 130/82, pulse (!) 112, temperature (!) 97.4 F (36.3 C), temperature source Oral, resp. rate 18, SpO2 99 %. There is no height or weight on file to calculate BMI.  Demographic Factors:  Male, Adolescent or young adult, Low socioeconomic status and Living alone  Loss Factors: Financial problems/change in socioeconomic status  Historical Factors: Impulsivity  Risk Reduction Factors:   Positive coping skills or problem solving skills  Continued Clinical Symptoms:  Previous Psychiatric Diagnoses and Treatments  Cognitive Features That Contribute To Risk:  Thought constriction (tunnel vision)    Suicide Risk:  Minimal: No identifiable suicidal ideation.  Patients presenting with no risk factors but with morbid ruminations; may be classified as minimal risk based on the severity of the depressive symptoms  Plan Of Care/Follow-up recommendations:  Activity:  as tolerated Diet:   regular Other:    Patient is instructed prior to discharge to: Take all medications as prescribed by his/her mental healthcare provider. Report any adverse effects and or reactions from the medicines to his/her outpatient provider promptly. Patient has been instructed & cautioned: To not engage in alcohol and or illegal drug use while on prescription medicines. In the event of worsening symptoms, patient is instructed to call the crisis hotline, 911 and or go to the nearest ED for appropriate evaluation and treatment of symptoms. To follow-up with his/her primary care provider for your other medical issues, concerns and or health care needs.    Please come to [facility] (this facility) during walk in hours for  appointment with psychiatrist for further medication management and for therapy.   Walk in hours are 8-11 AM Monday through Thursday for medication management.It is first come, first -serve; it is best to arrive by 7:00 AM. On Friday from 1 pm to 4 pm for therapy intake only. Please arrive by 12:00 pm as it is  first come, first -serve.   When you arrive please go upstairs for your appointment. If you are unsure of where to go, inform the front desk that you are here for a walk in appointment and they will assist you with directions upstairs.  Address:  [address], in [place], [place] Ph: [phone]    Disposition: [facility] rescue [facility]  Ival Bible, MD 03/12/2021, 3:25 PM

## 2021-03-12 NOTE — ED Notes (Signed)
Pt sleeping@this time. Breathing even and unlabored. Will continue to monitor for safety 

## 2021-03-12 NOTE — ED Notes (Signed)
Patient discharged.

## 2021-03-12 NOTE — ED Provider Notes (Addendum)
Behavioral Health Admission [facility] OBS)  Date: 03/12/21 Patient Name: Tyler White MRN: [id number] Chief Complaint: No chief complaint on file.     Diagnoses:  Final diagnoses:  Schizoaffective disorder, unspecified type ([place])  Suicidal ideation    HPI: Tyler White is a 25y/o male. Patient presented voluntarily to [facility] for evaluation of visual and auditory hallucination as well as suicidal ideations. [facility] to [facility] for continuous assessment and stabilization. Patient was assessed face to face by this writer upon arrival to [facility]. Patient is alert and oriented X4. Patient is minimally cooperative with assessment, gives brief answers to assessment questions and unwilling to elaborate on follow up questions.   Patient reports a history of schizophrenia. He admits to auditory hallucination of "hearing voices." He states he does not know what the voices are saying to him but "they are affecting my life." He admits to visual hallucination of "shadows." He reports that he has not taken any of his psychotropic medication since he was last discharged from the hospital due to "my medicaid stopped working." He reports suicidal ideations of "thinking about killing myself but I don't have plans." He reports that suicidal thoughts started ~1 day prior to his presentation to [facility] ED; he reports that suicidal ideation has improved since coming to the ED. He reports that he can not identify any stressors/triggers.   Patient is endorsing suicidal thoughts and reports that he is unable to contract for safety. When asked by this writer if he is a danger to self, patient responded "I can't say what's going to happen." He denies homicidal ideations and paranoia; he does not appear to have any delusions. He denies illicit drug use although his UDS is positive for THC. He denies alcohol use. He reports that he is currently homeless and unemployed.    Patient is presenting differently  to initial assessment by TTS. Patient appears to potentially be seeking scondary gain. Will admit to [facility] for continuous assessment with reassessment by psychiatry in the morning.   PHQ 2-9:  [place] ED from 03/11/2021 in [place]  Thoughts that you would be better off dead, or of hurting yourself in some way Several days  PHQ-9 Total Score 17      [place] ED from 03/11/2021 in [facility] Most recent reading at 03/12/2021 12:02 AM ED from 03/11/2021 in [place] Most recent reading at 03/11/2021  3:45 PM Admission (Discharged) from 11/19/2020 in [place] [facility] Most recent reading at 11/19/2020  6:00 PM  C-SSRS RISK CATEGORY High Risk High Risk Error: Question 2 not populated       Total Time spent with patient: 30 minutes  Musculoskeletal  Strength & Muscle Tone: within normal limits Gait & Station: normal Patient leans: Right  Psychiatric Specialty Exam  Presentation General Appearance: Casual; Appropriate for Environment  Eye Contact:Fair  Speech:Blocked  Speech Volume:Decreased  Handedness:Right   Mood and Affect  Mood:Euthymic  Affect:Flat   Thought Process  Thought Processes:Coherent  Descriptions of Associations:Intact  Orientation:Full (Time, Place and Person)  Thought Content:WDL   Hallucinations:Hallucinations: Auditory; Visual Description of Auditory Hallucinations: pt reported that he hears voice however he states "i don't know what the voices are saying" Description of Visual Hallucinations: "shadows"  Ideas of Reference:None  Suicidal Thoughts:Suicidal Thoughts: Yes, Passive SI Passive Intent and/or Plan: Without Plan  Homicidal Thoughts:Homicidal Thoughts: No   Sensorium  Memory:Immediate  Fair; Recent Fair; Remote Fair  Judgment:Fair  Insight:Fair   Executive Functions   Concentration:Good  Attention Span:Good  [place]   Psychomotor Activity  Psychomotor Activity:Psychomotor Activity: Normal   Assets  Assets:Desire for Improvement; Financial Resources/Insurance; Physical Health   Sleep  Sleep:Sleep: Fair Number of Hours of Sleep: 6   Nutritional Assessment (For OBS and FBC admissions only) Has the patient had a weight loss or gain of 10 pounds or more in the last 3 months?: No Has the patient had a decrease in food intake/or appetite?: No Does the patient have dental problems?: No Does the patient have eating habits or behaviors that may be indicators of an eating disorder including binging or inducing vomiting?: No Has the patient recently lost weight without trying?: No Has the patient been eating poorly because of a decreased appetite?: No Malnutrition Screening Tool Score: 0    Physical Exam Vitals and nursing note reviewed.  Constitutional:      Appearance: He is well-developed.  HENT:     Head: Normocephalic and atraumatic.  Eyes:     General:        Right eye: No discharge.        Left eye: No discharge.     Conjunctiva/sclera: Conjunctivae normal.  Cardiovascular:     Rate and Rhythm: Normal rate and regular rhythm.     Heart sounds: No murmur heard.   Pulmonary:     Effort: Pulmonary effort is normal. No respiratory distress.     Breath sounds: Normal breath sounds.  Abdominal:     Palpations: Abdomen is soft.     Tenderness: There is no abdominal tenderness.  Musculoskeletal:     Cervical back: Neck supple.  Skin:    General: Skin is warm and dry.  Neurological:     Mental Status: He is alert.  Psychiatric:        Attention and Perception: He perceives auditory and visual hallucinations.        Mood and Affect: Affect is flat.        Behavior: Behavior is withdrawn. Behavior is not agitated or aggressive.        Thought Content: Thought content is not paranoid or  delusional. Thought content includes suicidal ideation. Thought content does not include homicidal ideation. Thought content does not include homicidal or suicidal plan.        Cognition and Memory: Cognition normal.    Review of Systems  Constitutional: Negative.   HENT: Negative.   Eyes: Negative for pain, discharge and redness.  Respiratory: Negative.   Cardiovascular: Negative.   Gastrointestinal: Negative.   Genitourinary: Negative.   Musculoskeletal: Negative.   Skin: Negative.   Neurological: Negative.  Negative for speech change.  Endo/Heme/Allergies: Negative.   Psychiatric/Behavioral: Positive for hallucinations, substance abuse and suicidal ideas.    Blood pressure 130/82, pulse (!) 112, temperature (!) 97.4 F (36.3 C), temperature source Oral, resp. rate 18, SpO2 99 %. There is no height or weight on file to calculate BMI.  Past Psychiatric History:    Is the patient at risk to self? Yes  Has the patient been a risk to self in the past 6 months? Yes .    Has the patient been a risk to self within the distant past? No   Is the patient a risk to others? No   Has the patient been a risk to others in the past 6 months? No   Has the patient  been a risk to others within the distant past? No   Past Medical History:  Past Medical History:  Diagnosis Date  . Hernia, inguinal, right   . Inguinal hernia    right  . Psychiatric illness   . Schizophrenia ([place])    No past surgical history on file.  Family History:  Family History  Problem Relation Age of Onset  . Psychiatric Illness Mother   . Hypertension Sister     Social History:  Social History   Socioeconomic History  . Marital status: Single    Spouse name: Not on file  . Number of children: Not on file  . Years of education: Not on file  . Highest education level: Not on file  Occupational History  . Not on file  Tobacco Use  . Smoking status: Current Every Day Smoker    Packs/day: 0.50    Years:  5.00    Pack years: 2.50    Types: Cigarettes  . Smokeless tobacco: Never Used  Vaping Use  . Vaping Use: Never used  Substance and Sexual Activity  . Alcohol use: Yes  . Drug use: Yes    Types: Marijuana  . Sexual activity: Yes    Birth control/protection: None  Other Topics Concern  . Not on file  Social History Narrative   ** Merged History Encounter **       ** Merged History Encounter **       Social Determinants of Health   Financial Resource Strain: Not on file  Food Insecurity: Not on file  Transportation Needs: Not on file  Physical Activity: Not on file  Stress: Not on file  Social Connections: Not on file  Intimate Partner Violence: Not on file    SDOH:  SDOH Screenings   Alcohol Screen: Low Risk   . Last Alcohol Screening Score (AUDIT): 0  Depression (PHQ2-9): Medium Risk  . PHQ-2 Score: 17  Financial Resource Strain: Not on file  Food Insecurity: Not on file  Housing: Not on file  Physical Activity: Not on file  Social Connections: Not on file  Stress: Not on file  Tobacco Use: High Risk  . Smoking Tobacco Use: Current Every Day Smoker  . Smokeless Tobacco Use: Never Used  Transportation Needs: Not on file    Last Labs:  Admission on 03/11/2021, Discharged on 03/11/2021  Component Date Value Ref Range Status  . Sodium 03/11/2021 138  135 - 145 mmol/L Final  . Potassium 03/11/2021 3.7  3.5 - 5.1 mmol/L Final  . Chloride 03/11/2021 102  98 - 111 mmol/L Final  . CO2 03/11/2021 21* 22 - 32 mmol/L Final  . Glucose, Bld 03/11/2021 94  70 - 99 mg/dL Final   Glucose reference range applies only to samples taken after fasting for at least 8 hours.  . BUN 03/11/2021 14  6 - 20 mg/dL Final  . Creatinine, Ser 03/11/2021 0.99  0.61 - 1.24 mg/dL Final  . Calcium 03/11/2021 10.0  8.9 - 10.3 mg/dL Final  . Total Protein 03/11/2021 8.1  6.5 - 8.1 g/dL Final  . Albumin 03/11/2021 4.7  3.5 - 5.0 g/dL Final  . AST 03/11/2021 27  15 - 41 U/L Final  . ALT  03/11/2021 15  0 - 44 U/L Final  . Alkaline Phosphatase 03/11/2021 62  38 - 126 U/L Final  . Total Bilirubin 03/11/2021 1.0  0.3 - 1.2 mg/dL Final  . GFR, Estimated 03/11/2021 >60  >60 mL/min Final   Comment: (  NOTE) Calculated using the CKD-EPI Creatinine Equation (2021)   . Anion gap 03/11/2021 15  5 - 15 Final   Performed at [facility] [address], [place], [place] [address]  . Alcohol, Ethyl (B) 03/11/2021 <10  <10 mg/dL Final   Comment: (NOTE) Lowest detectable limit for serum alcohol is 10 mg/dL.  For medical purposes only. Performed at [facility], [place] [address] [place], [place] [address]   . Salicylate Lvl 76/54/6503 <7.0* 7.0 - 30.0 mg/dL Final   Performed at [facility] [address], [place], [place] [address]  . Acetaminophen (Tylenol), Serum 03/11/2021 <10* 10 - 30 ug/mL Final   Comment: (NOTE) Therapeutic concentrations vary significantly. A range of 10-30 ug/mL  may be an effective concentration for many patients. However, some  are best treated at concentrations outside of this range. Acetaminophen concentrations >150 ug/mL at 4 hours after ingestion  and >50 ug/mL at 12 hours after ingestion are often associated with  toxic reactions.  Performed at [facility], [place] [address] [place], [place] [address]   . WBC 03/11/2021 7.3  4.0 - 10.5 K/uL Final  . RBC 03/11/2021 5.96* 4.22 - 5.81 MIL/uL Final  . Hemoglobin 03/11/2021 16.6  13.0 - 17.0 g/dL Final  . HCT 03/11/2021 48.8  39.0 - 52.0 % Final  . MCV 03/11/2021 81.9  80.0 - 100.0 fL Final  . MCH 03/11/2021 27.9  26.0 - 34.0 pg Final  . MCHC 03/11/2021 34.0  30.0 - 36.0 g/dL Final  . RDW 03/11/2021 12.9  11.5 - 15.5 % Final  . Platelets 03/11/2021 366  150 - 400 K/uL Final  . nRBC 03/11/2021 0.0  0.0 - 0.2 % Final   Performed at [facility], [place] [address], [place], [place] [address]  . Opiates 03/11/2021 NONE DETECTED  NONE DETECTED Final  . Cocaine 03/11/2021 NONE  DETECTED  NONE DETECTED Final  . Benzodiazepines 03/11/2021 NONE DETECTED  NONE DETECTED Final  . Amphetamines 03/11/2021 NONE DETECTED  NONE DETECTED Final  . Tetrahydrocannabinol 03/11/2021 POSITIVE* NONE DETECTED Final  . Barbiturates 03/11/2021 NONE DETECTED  NONE DETECTED Final   Comment: (NOTE) DRUG SCREEN FOR MEDICAL PURPOSES ONLY.  IF CONFIRMATION IS NEEDED FOR ANY PURPOSE, NOTIFY LAB WITHIN 5 DAYS.  LOWEST DETECTABLE LIMITS FOR URINE DRUG SCREEN Drug Class                     Cutoff (ng/mL) Amphetamine and metabolites    1000 Barbiturate and metabolites    200 Benzodiazepine                 749 Tricyclics and metabolites     300 Opiates and metabolites        300 Cocaine and metabolites        300 THC                            50 Performed at [facility], [place] [address], [place], [place] [address]   . SARS Coronavirus 2 by RT PCR 03/11/2021 NEGATIVE  NEGATIVE Final   Comment: (NOTE) SARS-CoV-2 target nucleic acids are NOT DETECTED.  The SARS-CoV-2 RNA is generally detectable in upper respiratory specimens during the acute phase of infection. The lowest concentration of SARS-CoV-2 viral copies this assay can detect is 138 copies/mL. A negative result does not preclude SARS-Cov-2 infection and should not be  used as the sole basis for treatment or other patient management decisions. A negative result may occur with  improper specimen collection/handling, submission of specimen other than nasopharyngeal swab, presence of viral mutation(s) within the areas targeted by this assay, and inadequate number of viral copies(<138 copies/mL). A negative result must be combined with clinical observations, patient history, and epidemiological information. The expected result is Negative.  Fact Sheet for Patients:  EntrepreneurPulse.com.au  Fact Sheet for Healthcare Providers:  IncredibleEmployment.be  This test is no                           t yet approved or cleared by the [place] FDA and  has been authorized for detection and/or diagnosis of SARS-CoV-2 by FDA under an Emergency Use Authorization (EUA). This EUA will remain  in effect (meaning this test can be used) for the duration of the COVID-19 declaration under Section 564(b)(1) of the Act, 21 U.S.C.section 360bbb-3(b)(1), unless the authorization is terminated  or revoked sooner.      . Influenza A by PCR 03/11/2021 NEGATIVE  NEGATIVE Final  . Influenza B by PCR 03/11/2021 NEGATIVE  NEGATIVE Final   Comment: (NOTE) The Xpert Xpress SARS-CoV-2/FLU/RSV plus assay is intended as an aid in the diagnosis of influenza from Nasopharyngeal swab specimens and should not be used as a sole basis for treatment. Nasal washings and aspirates are unacceptable for Xpert Xpress SARS-CoV-2/FLU/RSV testing.  Fact Sheet for Patients: EntrepreneurPulse.com.au  Fact Sheet for Healthcare Providers: IncredibleEmployment.be  This test is not yet approved or cleared by the [place] FDA and has been authorized for detection and/or diagnosis of SARS-CoV-2 by FDA under an Emergency Use Authorization (EUA). This EUA will remain in effect (meaning this test can be used) for the duration of the COVID-19 declaration under Section 564(b)(1) of the Act, 21 U.S.C. section 360bbb-3(b)(1), unless the authorization is terminated or revoked.  Performed at [facility], [place] [address], [place], [place] [address]   Admission on 11/19/2020, Discharged on 11/29/2020  Component Date Value Ref Range Status  . Valproic Acid Lvl 11/28/2020 27* 50.0 - 100.0 ug/mL Final   Performed at [facility], [place] [address] [place], [place] [address]  . Iron 11/28/2020 46  45 - 182 ug/dL Final  . TIBC 11/28/2020 380  250 - 450 ug/dL Final  . Saturation Ratios 11/28/2020 12* 17.9 - 39.5 % Final  . UIBC 11/28/2020 334  ug/dL Final    Performed at [facility], [place] [address] [place], [place] [address]  . Ferritin 11/28/2020 110  24 - 336 ng/mL Final   Performed at [facility], [place] [address], [place], [place] [address]  . WBC 11/29/2020 8.2  4.0 - 10.5 K/uL Final  . RBC 11/29/2020 5.06  4.22 - 5.81 MIL/uL Final  . Hemoglobin 11/29/2020 14.4  13.0 - 17.0 g/dL Final  . HCT 11/29/2020 44.1  39.0 - 52.0 % Final  . MCV 11/29/2020 87.2  80.0 - 100.0 fL Final  . MCH 11/29/2020 28.5  26.0 - 34.0 pg Final  . MCHC 11/29/2020 32.7  30.0 - 36.0 g/dL Final  . RDW 11/29/2020 14.0  11.5 - 15.5 % Final  . Platelets 11/29/2020 310  150 - 400 K/uL Final  . nRBC 11/29/2020 0.0  0.0 - 0.2 % Final  . Neutrophils Relative % 11/29/2020 64  % Final  . Neutro Abs 11/29/2020 5.2  1.7 -  7.7 K/uL Final  . Lymphocytes Relative 11/29/2020 25  % Final  . Lymphs Abs 11/29/2020 2.1  0.7 - 4.0 K/uL Final  . Monocytes Relative 11/29/2020 7  % Final  . Monocytes Absolute 11/29/2020 0.6  0.1 - 1.0 K/uL Final  . Eosinophils Relative 11/29/2020 3  % Final  . Eosinophils Absolute 11/29/2020 0.3  0.0 - 0.5 K/uL Final  . Basophils Relative 11/29/2020 1  % Final  . Basophils Absolute 11/29/2020 0.0  0.0 - 0.1 K/uL Final  . Immature Granulocytes 11/29/2020 0  % Final  . Abs Immature Granulocytes 11/29/2020 0.02  0.00 - 0.07 K/uL Final   Performed at [facility], [place] [address], [place], [place] [address]  . Sodium 11/29/2020 139  135 - 145 mmol/L Final  . Potassium 11/29/2020 4.2  3.5 - 5.1 mmol/L Final  . Chloride 11/29/2020 103  98 - 111 mmol/L Final  . CO2 11/29/2020 25  22 - 32 mmol/L Final  . Glucose, Bld 11/29/2020 108* 70 - 99 mg/dL Final   Glucose reference range applies only to samples taken after fasting for at least 8 hours.  . BUN 11/29/2020 14  6 - 20 mg/dL Final  . Creatinine, Ser 11/29/2020 0.92  0.61 - 1.24 mg/dL Final  . Calcium 11/29/2020 9.5  8.9 - 10.3 mg/dL Final  . Total  Protein 11/29/2020 7.4  6.5 - 8.1 g/dL Final  . Albumin 11/29/2020 4.3  3.5 - 5.0 g/dL Final  . AST 11/29/2020 56* 15 - 41 U/L Final  . ALT 11/29/2020 39  0 - 44 U/L Final  . Alkaline Phosphatase 11/29/2020 57  38 - 126 U/L Final  . Total Bilirubin 11/29/2020 0.5  0.3 - 1.2 mg/dL Final  . GFR, Estimated 11/29/2020 >60  >60 mL/min Final   Comment: (NOTE) Calculated using the CKD-EPI Creatinine Equation (2021)   . Anion gap 11/29/2020 11  5 - 15 Final   Performed at [facility], [place] [address], [place], [place] [address]  Admission on 11/18/2020, Discharged on 11/19/2020  Component Date Value Ref Range Status  . Sodium 11/18/2020 135  135 - 145 mmol/L Final  . Potassium 11/18/2020 3.4* 3.5 - 5.1 mmol/L Final  . Chloride 11/18/2020 104  98 - 111 mmol/L Final  . CO2 11/18/2020 19* 22 - 32 mmol/L Final  . Glucose, Bld 11/18/2020 77  70 - 99 mg/dL Final   Glucose reference range applies only to samples taken after fasting for at least 8 hours.  . BUN 11/18/2020 10  6 - 20 mg/dL Final  . Creatinine, Ser 11/18/2020 0.63  0.61 - 1.24 mg/dL Final  . Calcium 11/18/2020 9.2  8.9 - 10.3 mg/dL Final  . Total Protein 11/18/2020 7.7  6.5 - 8.1 g/dL Final  . Albumin 11/18/2020 4.5  3.5 - 5.0 g/dL Final  . AST 11/18/2020 28  15 - 41 U/L Final  . ALT 11/18/2020 19  0 - 44 U/L Final  . Alkaline Phosphatase 11/18/2020 55  38 - 126 U/L Final  . Total Bilirubin 11/18/2020 0.5  0.3 - 1.2 mg/dL Final  . GFR, Estimated 11/18/2020 >60  >60 mL/min Final   Comment: (NOTE) Calculated using the CKD-EPI Creatinine Equation (2021)   . Anion gap 11/18/2020 12  5 - 15 Final   Performed at [facility], [place] [address] [place], [place] [address]  . Alcohol, Ethyl (B) 11/18/2020 23* <10 mg/dL Final   Comment: (NOTE) Lowest detectable limit  for serum alcohol is 10 mg/dL.  For medical purposes only. Performed at [facility], [place] [address], [place], [place] [address]   . Salicylate Lvl 15/40/0867 <7.0* 7.0 - 30.0 mg/dL Final   Performed at [facility] [address] [place], [place] [address]  . Acetaminophen (Tylenol), Serum 11/18/2020 <10* 10 - 30 ug/mL Final   Comment: (NOTE) Therapeutic concentrations vary significantly. A range of 10-30 ug/mL  may be an effective concentration for many patients. However, some  are best treated at concentrations outside of this range. Acetaminophen concentrations >150 ug/mL at 4 hours after ingestion  and >50 ug/mL at 12 hours after ingestion are often associated with  toxic reactions.  Performed at [facility], [place] [address] [place], [place] [address]   . WBC 11/18/2020 9.6  4.0 - 10.5 K/uL Final  . RBC 11/18/2020 4.90  4.22 - 5.81 MIL/uL Final  . Hemoglobin 11/18/2020 14.0  13.0 - 17.0 g/dL Final  . HCT 11/18/2020 42.5  39.0 - 52.0 % Final  . MCV 11/18/2020 86.7  80.0 - 100.0 fL Final  . MCH 11/18/2020 28.6  26.0 - 34.0 pg Final  . MCHC 11/18/2020 32.9  30.0 - 36.0 g/dL Final  . RDW 11/18/2020 14.3  11.5 - 15.5 % Final  . Platelets 11/18/2020 342  150 - 400 K/uL Final  . nRBC 11/18/2020 0.0  0.0 - 0.2 % Final   Performed at [facility], [place] [address], [place], [place] [address]  . Opiates 11/18/2020 NONE DETECTED  NONE DETECTED Final  . Cocaine 11/18/2020 POSITIVE* NONE DETECTED Final  . Benzodiazepines 11/18/2020 NONE DETECTED  NONE DETECTED Final  . Amphetamines 11/18/2020 NONE DETECTED  NONE DETECTED Final  . Tetrahydrocannabinol 11/18/2020 NONE DETECTED  NONE DETECTED Final  . Barbiturates 11/18/2020 NONE DETECTED  NONE DETECTED Final   Comment: (NOTE) DRUG SCREEN FOR MEDICAL PURPOSES ONLY.  IF CONFIRMATION IS NEEDED FOR ANY PURPOSE, NOTIFY LAB WITHIN 5 DAYS.  LOWEST DETECTABLE LIMITS FOR URINE DRUG SCREEN Drug Class                     Cutoff (ng/mL) Amphetamine and metabolites    1000 Barbiturate and  metabolites    200 Benzodiazepine                 099 Tricyclics and metabolites     300 Opiates and metabolites        300 Cocaine and metabolites        300 THC                            50 Performed at [facility], [place] [address] [place], [place] [address]   . SARS Coronavirus 2 by RT PCR 11/18/2020 NEGATIVE  NEGATIVE Final   Comment: (NOTE) SARS-CoV-2 target nucleic acids are NOT DETECTED.  The SARS-CoV-2 RNA is generally detectable in upper respiratory specimens during the acute phase of infection. The lowest concentration of SARS-CoV-2 viral copies this assay can detect is 138 copies/mL. A negative result does not preclude SARS-Cov-2 infection and should not be used as the sole basis for treatment or other patient management decisions. A negative result may occur with  improper specimen collection/handling, submission of specimen other than nasopharyngeal swab, presence of viral mutation(s) within the areas targeted by this assay, and inadequate number of viral copies(<138 copies/mL). A negative result must  be combined with clinical observations, patient history, and epidemiological information. The expected result is Negative.  Fact Sheet for Patients:  EntrepreneurPulse.com.au  Fact Sheet for Healthcare Providers:  IncredibleEmployment.be  This test is no                          t yet approved or cleared by the [place] FDA and  has been authorized for detection and/or diagnosis of SARS-CoV-2 by FDA under an Emergency Use Authorization (EUA). This EUA will remain  in effect (meaning this test can be used) for the duration of the COVID-19 declaration under Section 564(b)(1) of the Act, 21 U.S.C.section 360bbb-3(b)(1), unless the authorization is terminated  or revoked sooner.      . Influenza A by PCR 11/18/2020 NEGATIVE  NEGATIVE Final  . Influenza B by PCR 11/18/2020 NEGATIVE  NEGATIVE Final    Comment: (NOTE) The Xpert Xpress SARS-CoV-2/FLU/RSV plus assay is intended as an aid in the diagnosis of influenza from Nasopharyngeal swab specimens and should not be used as a sole basis for treatment. Nasal washings and aspirates are unacceptable for Xpert Xpress SARS-CoV-2/FLU/RSV testing.  Fact Sheet for Patients: EntrepreneurPulse.com.au  Fact Sheet for Healthcare Providers: IncredibleEmployment.be  This test is not yet approved or cleared by the [place] FDA and has been authorized for detection and/or diagnosis of SARS-CoV-2 by FDA under an Emergency Use Authorization (EUA). This EUA will remain in effect (meaning this test can be used) for the duration of the COVID-19 declaration under Section 564(b)(1) of the Act, 21 U.S.C. section 360bbb-3(b)(1), unless the authorization is terminated or revoked.  Performed at [facility], [place] [address], [place], [place] [address]   Admission on 11/02/2020, Discharged on 11/03/2020  Component Date Value Ref Range Status  . SARS Coronavirus 2 by RT PCR 11/02/2020 NEGATIVE  NEGATIVE Final   Comment: (NOTE) SARS-CoV-2 target nucleic acids are NOT DETECTED.  The SARS-CoV-2 RNA is generally detectable in upper respiratoy specimens during the acute phase of infection. The lowest concentration of SARS-CoV-2 viral copies this assay can detect is 131 copies/mL. A negative result does not preclude SARS-Cov-2 infection and should not be used as the sole basis for treatment or other patient management decisions. A negative result may occur with  improper specimen collection/handling, submission of specimen other than nasopharyngeal swab, presence of viral mutation(s) within the areas targeted by this assay, and inadequate number of viral copies (<131 copies/mL). A negative result must be combined with clinical observations, patient history, and epidemiological information.  The expected result is Negative.  Fact Sheet for Patients:  PinkCheek.be  Fact Sheet for Healthcare Providers:  GravelBags.it  This test is no                          t yet approved or cleared by the [place] FDA and  has been authorized for detection and/or diagnosis of SARS-CoV-2 by FDA under an Emergency Use Authorization (EUA). This EUA will remain  in effect (meaning this test can be used) for the duration of the COVID-19 declaration under Section 564(b)(1) of the Act, 21 U.S.C. section 360bbb-3(b)(1), unless the authorization is terminated or revoked sooner.    . Influenza A by PCR 11/02/2020 NEGATIVE  NEGATIVE Final  . Influenza B by PCR 11/02/2020 NEGATIVE  NEGATIVE Final   Comment: (NOTE) The Xpert Xpress SARS-CoV-2/FLU/RSV assay is intended as an aid in  the diagnosis of  influenza from Nasopharyngeal swab specimens and  should not be used as a sole basis for treatment. Nasal washings and  aspirates are unacceptable for Xpert Xpress SARS-CoV-2/FLU/RSV  testing.  Fact Sheet for Patients: PinkCheek.be  Fact Sheet for Healthcare Providers: GravelBags.it  This test is not yet approved or cleared by the [place] FDA and  has been authorized for detection and/or diagnosis of SARS-CoV-2 by  FDA under an Emergency Use Authorization (EUA). This EUA will remain  in effect (meaning this test can be used) for the duration of the  Covid-19 declaration under Section 564(b)(1) of the Act, 21  U.S.C. section 360bbb-3(b)(1), unless the authorization is  terminated or revoked. Performed at [facility], [place] [address], [place], [place] [address]   . WBC 11/02/2020 9.3  4.0 - 10.5 K/uL Final  . RBC 11/02/2020 4.78  4.22 - 5.81 MIL/uL Final  . Hemoglobin 11/02/2020 13.5  13.0 - 17.0 g/dL Final  . HCT 11/02/2020 39.4  39.0 - 52.0 % Final  . MCV  11/02/2020 82.4  80.0 - 100.0 fL Final  . MCH 11/02/2020 28.2  26.0 - 34.0 pg Final  . MCHC 11/02/2020 34.3  30.0 - 36.0 g/dL Final  . RDW 11/02/2020 13.3  11.5 - 15.5 % Final  . Platelets 11/02/2020 328  150 - 400 K/uL Final  . nRBC 11/02/2020 0.0  0.0 - 0.2 % Final  . Neutrophils Relative % 11/02/2020 63  % Final  . Neutro Abs 11/02/2020 5.9  1.7 - 7.7 K/uL Final  . Lymphocytes Relative 11/02/2020 26  % Final  . Lymphs Abs 11/02/2020 2.4  0.7 - 4.0 K/uL Final  . Monocytes Relative 11/02/2020 7  % Final  . Monocytes Absolute 11/02/2020 0.6  0.1 - 1.0 K/uL Final  . Eosinophils Relative 11/02/2020 3  % Final  . Eosinophils Absolute 11/02/2020 0.3  0.0 - 0.5 K/uL Final  . Basophils Relative 11/02/2020 1  % Final  . Basophils Absolute 11/02/2020 0.1  0.0 - 0.1 K/uL Final  . Immature Granulocytes 11/02/2020 0  % Final  . Abs Immature Granulocytes 11/02/2020 0.03  0.00 - 0.07 K/uL Final   Performed at [facility], [place] [address], [place], [place] [address]  . Hgb A1c MFr Bld 11/02/2020 5.1  4.8 - 5.6 % Final   Comment: (NOTE) Pre diabetes:          5.7%-6.4%  Diabetes:              >6.4%  Glycemic control for   <7.0% adults with diabetes   . Mean Plasma Glucose 11/02/2020 99.67  mg/dL Final   Performed at [facility] [address] [place], [place] [address]  . Alcohol, Ethyl (B) 11/02/2020 82* <10 mg/dL Final   Comment: (NOTE) Lowest detectable limit for serum alcohol is 10 mg/dL.  For medical purposes only. Performed at [facility], [place] [address] [place], [place] [address]   . Cholesterol 11/02/2020 148  0 - 200 mg/dL Final  . Triglycerides 11/02/2020 47  <150 mg/dL Final  . HDL 11/02/2020 71  >40 mg/dL Final  . Total CHOL/HDL Ratio 11/02/2020 2.1  RATIO Final  . VLDL 11/02/2020 9  0 - 40 mg/dL Final  . LDL Cholesterol 11/02/2020 68  0 - 99 mg/dL Final   Comment:        Total Cholesterol/HDL:CHD Risk Coronary Heart Disease Risk Table  Men   Women  1/2 Average Risk   3.4   3.3  Average Risk       5.0   4.4  2 X Average Risk   9.6   7.1  3 X Average Risk  23.4   11.0        Use the calculated Patient Ratio above and the CHD Risk Table to determine the patient's CHD Risk.        ATP III CLASSIFICATION (LDL):  <100     mg/dL   Optimal  100-129  mg/dL   Near or Above                    Optimal  130-159  mg/dL   Borderline  160-189  mg/dL   High  >190     mg/dL   Very High Performed at [place] [address], [place], [place] [address]   . TSH 11/02/2020 0.658  0.350 - 4.500 uIU/mL Final   Comment: Performed by a 3rd Generation assay with a functional sensitivity of <=0.01 uIU/mL. Performed at [facility], [place] [address] [place], [place] [address]   . POC Amphetamine UR 11/02/2020 None Detected  None Detected Final  . POC Secobarbital (BAR) 11/02/2020 None Detected  None Detected Final  . POC Buprenorphine (BUP) 11/02/2020 None Detected  None Detected Final  . POC Oxazepam (BZO) 11/02/2020 None Detected  None Detected Final  . POC Cocaine UR 11/02/2020 None Detected  None Detected Final  . POC Methamphetamine UR 11/02/2020 None Detected  None Detected Final  . POC Morphine 11/02/2020 None Detected  None Detected Final  . POC Oxycodone UR 11/02/2020 None Detected  None Detected Final  . POC Methadone UR 11/02/2020 None Detected  None Detected Final  . POC Marijuana UR 11/02/2020 Positive* None Detected Final  . SARS Coronavirus 2 Ag 11/02/2020 NEGATIVE  NEGATIVE Final   Comment: (NOTE) SARS-CoV-2 antigen NOT DETECTED.   Negative results are presumptive.  Negative results do not preclude SARS-CoV-2 infection and should not be used as the sole basis for treatment or other patient management decisions, including infection  control decisions, particularly in the presence of clinical signs and  symptoms consistent with COVID-19, or in those who have been in contact with the virus.  Negative  results must be combined with clinical observations, patient history, and epidemiological information. The expected result is Negative.  Fact Sheet for Patients: PodPark.tn  Fact Sheet for Healthcare Providers: GiftContent.is   This test is not yet approved or cleared by the [place] FDA and  has been authorized for detection and/or diagnosis of SARS-CoV-2 by FDA under an Emergency Use Authorization (EUA).  This EUA will remain in effect (meaning this test can be used) for the duration of  the C                          OVID-19 declaration under Section 564(b)(1) of the Act, 21 U.S.C. section 360bbb-3(b)(1), unless the authorization is terminated or revoked sooner.      Allergies: Patient has no known allergies.  PTA Medications: (Not in a hospital admission)   Medical Decision Making  Admit to [facility] for continuous assessment and stabilization for safety.  Restart Risperidone 1mg  QHS (per discharge med list from [facility])   Recommendations  Based on my evaluation the patient does not appear to have an emergency medical condition.  Patient is presenting differently to  initial assessment by TTS. Patient appears to potentially be seeking secondary gain. Will admit to [facility] for continuous assessment with reassessment by psychiatry in the morning.   Ophelia Shoulder, NP 03/12/21  5:32 AM

## 2021-03-12 NOTE — ED Notes (Signed)
Pt given breakfast and juice. Pt accepted morning meds w/o difficulty. Very soft spoken. Stated he is still hearing voices but can't say what they are saying. Safety maintained and will continue to monitor.

## 2021-03-12 NOTE — ED Notes (Signed)
Discharge instructions provided and Pt stated understanding. Samples provided also. Personal belongings returned from locker number 11. Pt alert, orient and ambulatory. Safety maintained. Pt escorted to the [facility] [place] for safe transport services.

## 2021-03-12 NOTE — ED Notes (Signed)
Pt belongings in locker #11  

## 2021-03-12 NOTE — ED Notes (Signed)
Safe Transport called for services to be provided to [employer].

## 2021-03-12 NOTE — Telephone Encounter (Signed)
Care Management - Follow Up [facility] Discharges   Writer attempted to make contact with patient today and was unsuccessful. Patient phone is disconnected.

## 2021-03-12 NOTE — Discharge Instructions (Signed)
Please come to [facility] (this facility) during walk in hours for appointment with psychiatrist for further medication management and for therapy.   Walk in hours are 8-11 AM Monday through Thursday for medication management.It is first come, first -serve; it is best to arrive by 7:00 AM. On Friday from 1 pm to 4 pm for therapy intake only. Please arrive by 12:00 pm as it is  first come, first -serve.   When you arrive please go upstairs for your appointment. If you are unsure of where to go, inform the front desk that you are here for a walk in appointment and they will assist you with directions upstairs.  Address:  [address], in [place], [place] Ph: [phone]

## 2021-03-12 NOTE — ED Notes (Signed)
Pt sleeping'@this'$  time. breathing even and unlabored. Will continue to monitor for safety

## 2021-04-12 ENCOUNTER — Emergency Department (HOSPITAL_COMMUNITY)
Admission: EM | Admit: 2021-04-12 | Discharge: 2021-04-12 | Disposition: A | Discharge status: Home / Self Care | Payer: Medicaid Other | Attending: Emergency Medicine | Admitting: Emergency Medicine

## 2021-04-12 ENCOUNTER — Other Ambulatory Visit: Payer: Self-pay

## 2021-04-12 ENCOUNTER — Emergency Department (HOSPITAL_COMMUNITY): Payer: Medicaid Other

## 2021-04-12 ENCOUNTER — Encounter (HOSPITAL_COMMUNITY): Payer: Self-pay | Admitting: Emergency Medicine

## 2021-04-12 DIAGNOSIS — F17210 Nicotine dependence, cigarettes, uncomplicated: Secondary | ICD-10-CM | POA: Insufficient documentation

## 2021-04-12 DIAGNOSIS — S91331A Puncture wound without foreign body, right foot, initial encounter: Secondary | ICD-10-CM | POA: Insufficient documentation

## 2021-04-12 DIAGNOSIS — W268XXA Contact with other sharp object(s), not elsewhere classified, initial encounter: Secondary | ICD-10-CM | POA: Diagnosis not present

## 2021-04-12 DIAGNOSIS — Y9301 Activity, walking, marching and hiking: Secondary | ICD-10-CM | POA: Diagnosis not present

## 2021-04-12 DIAGNOSIS — S99921A Unspecified injury of right foot, initial encounter: Secondary | ICD-10-CM | POA: Diagnosis present

## 2021-04-12 DIAGNOSIS — S91339A Puncture wound without foreign body, unspecified foot, initial encounter: Secondary | ICD-10-CM

## 2021-04-12 MED ORDER — CIPROFLOXACIN HCL 500 MG PO TABS: 500.0000 mg | ORAL_TABLET | Freq: Two times a day (BID) | ORAL | 0 refills | Status: DC

## 2021-04-12 NOTE — ED Notes (Signed)
Patient transported to X-ray 

## 2021-04-12 NOTE — ED Triage Notes (Signed)
Patient accidentally stepped on a piece of metal yesterday , presents with puncture wound at right plantar foot with mild bleeding .

## 2021-04-12 NOTE — ED Provider Notes (Signed)
[facility] EMERGENCY DEPARTMENT Provider Note   CSN: [id number] Arrival date & time: 04/12/21  2992     History Chief Complaint  Patient presents with  . Foot Injury    Tyler White is a 25 y.o. male.  HPI Patient reports he had a soft soled slipper on yesterday walking outside.  He reports he stepped on a piece of metal that he had to pull out of his right foot.  He reports is continued to bleed a small amount into his sock.  Reports is mildly painful.  No difficulty walking.  Patient is otherwise healthy without any history of diabetes.    Past Medical History:  Diagnosis Date  . Hernia, inguinal, right   . Inguinal hernia    right  . Psychiatric illness   . Schizophrenia [facility])     Patient Active Problem List   Diagnosis Date Noted  . Schizophrenia ([place]) 11/19/2020  . Cocaine abuse ([place]) 02/19/2018  . Cocaine abuse with cocaine-induced mood disorder ([place]) 02/19/2018  . Psychosis ([place]) 11/27/2017  . Cannabis abuse with psychotic disorder, with delusions ([place]) 11/26/2017  . Schizophrenia, unspecified ([place]) 11/26/2017    History reviewed. No pertinent surgical history.     Family History  Problem Relation Age of Onset  . Psychiatric Illness Mother   . Hypertension Sister     Social History   Tobacco Use  . Smoking status: Current Every Day Smoker    Packs/day: 0.50    Years: 5.00    Pack years: 2.50    Types: Cigarettes  . Smokeless tobacco: Never Used  Vaping Use  . Vaping Use: Never used  Substance Use Topics  . Alcohol use: Yes  . Drug use: Yes    Types: Marijuana    Home Medications Prior to Admission medications   Medication Sig Start Date End Date Taking? Authorizing Provider  ciprofloxacin (CIPRO) 500 MG tablet Take 1 tablet (500 mg total) by mouth 2 (two) times daily. One po bid x 7 days 04/12/21  Yes Orie Cuttino, Jeannie Done, MD  hydrOXYzine (ATARAX/VISTARIL) 25 MG tablet Take 1 tablet (25 mg total) by mouth 3 (three) times  daily as needed for anxiety. Patient not taking: Reported on 04/12/2021 03/12/21   Ival Bible, MD  risperiDONE (RISPERDAL M-TABS) 2 MG disintegrating tablet Take 1 tablet (2 mg total) by mouth daily. Patient not taking: Reported on 04/12/2021 03/12/21 04/11/21  Ival Bible, MD    Allergies    Patient has no known allergies.  Review of Systems   Review of Systems Constitutional: No fever no chills no malaise Neurologic: No weakness numbness or tingling Physical Exam Updated Vital Signs BP 123/86 (BP Location: Right Arm)   Pulse 78   Temp 98.5 F (36.9 C) (Oral)   Resp 16   Ht 5\' 7"  (1.702 m)   Wt 65 kg   SpO2 100%   BMI 22.44 kg/m   Physical Exam Constitutional:      Comments: Alert nontoxic no acute distress  HENT:     Mouth/Throat:     Pharynx: Oropharynx is clear.  Eyes:     Extraocular Movements: Extraocular movements intact.  Pulmonary:     Effort: Pulmonary effort is normal.  Musculoskeletal:        General: Signs of injury present. No swelling, tenderness or deformity. Normal range of motion.     Right lower leg: No edema.     Left lower leg: No edema.     Comments: See  attached image for puncture wound without active bleeding.  Sole of the foot is nontender to direct palpation.  Skin:    General: Skin is warm and dry.  Neurological:     General: No focal deficit present.     Mental Status: He is oriented to person, place, and time.     Coordination: Coordination normal.  Psychiatric:     Comments: Affect is flat.  Patient is appropriately interactive and cooperative.       ED Results / Procedures / Treatments   Labs (all labs ordered are listed, but only abnormal results are displayed) Labs Reviewed - No data to display  EKG None  Radiology DG Foot Complete Right  Result Date: 04/12/2021 CLINICAL DATA:  25 year old male stepped on metal. EXAM: RIGHT FOOT COMPLETE - 3+ VIEW COMPARISON:  Right foot series 06/10/2017. FINDINGS: No  radiopaque foreign body identified. No discrete soft tissue injury identified. No soft tissue gas. Bones of the right foot appear stable and intact, with stable alignment since 2018. Relatively preserved joint spaces. IMPRESSION: No radiopaque foreign body or acute osseous abnormality identified. Electronically Signed   By: Genevie Ann M.D.   On: 04/12/2021 07:45    Procedures Procedures   Medications Ordered in ED Medications - No data to display  ED Course  I have reviewed the triage vital signs and the nursing notes.  Pertinent labs & imaging results that were available during my care of the patient were reviewed by me and considered in my medical decision making (see chart for details).    MDM Rules/Calculators/A&P                          Patient presents with a puncture wound from outdoor metallic object into foot through the sole of his soft shoe or slipper.  No signs of active infection at this time.  However with puncture wound will opt to initiate Cipro.  Patient has been counseled on cleaning and application of antibiotic ointment.  Return precautions reviewed. Final Clinical Impression(s) / ED Diagnoses Final diagnoses:  Puncture wound of plantar aspect of foot without complication, initial encounter    Rx / DC Orders ED Discharge Orders         Ordered    ciprofloxacin (CIPRO) 500 MG tablet  2 times daily        04/12/21 0802           Charlesetta Shanks, MD 04/12/21 [phone]

## 2021-04-20 ENCOUNTER — Other Ambulatory Visit: Payer: Self-pay

## 2021-04-20 ENCOUNTER — Encounter (HOSPITAL_COMMUNITY): Payer: Self-pay | Admitting: *Deleted

## 2021-04-20 ENCOUNTER — Emergency Department (HOSPITAL_COMMUNITY)
Admission: EM | Admit: 2021-04-20 | Discharge: 2021-04-21 | Disposition: A | Discharge status: Home / Self Care | Payer: Medicaid Other | Attending: Emergency Medicine | Admitting: Emergency Medicine

## 2021-04-20 DIAGNOSIS — F17210 Nicotine dependence, cigarettes, uncomplicated: Secondary | ICD-10-CM | POA: Diagnosis not present

## 2021-04-20 DIAGNOSIS — F1210 Cannabis abuse, uncomplicated: Secondary | ICD-10-CM | POA: Insufficient documentation

## 2021-04-20 DIAGNOSIS — R440 Auditory hallucinations: Secondary | ICD-10-CM | POA: Insufficient documentation

## 2021-04-20 DIAGNOSIS — F209 Schizophrenia, unspecified: Secondary | ICD-10-CM | POA: Diagnosis not present

## 2021-04-20 DIAGNOSIS — R45851 Suicidal ideations: Secondary | ICD-10-CM

## 2021-04-20 DIAGNOSIS — Z79899 Other long term (current) drug therapy: Secondary | ICD-10-CM | POA: Diagnosis not present

## 2021-04-20 DIAGNOSIS — Z20822 Contact with and (suspected) exposure to covid-19: Secondary | ICD-10-CM | POA: Insufficient documentation

## 2021-04-20 LAB — COMPREHENSIVE METABOLIC PANEL
ALT: 16 U/L (ref 0–44)
AST: 22 U/L (ref 15–41)
Albumin: 4.2 g/dL (ref 3.5–5.0)
Alkaline Phosphatase: 50 U/L (ref 38–126)
Anion gap: 7 (ref 5–15)
BUN: 13 mg/dL (ref 6–20)
CO2: 26 mmol/L (ref 22–32)
Calcium: 9.4 mg/dL (ref 8.9–10.3)
Chloride: 106 mmol/L (ref 98–111)
Creatinine, Ser: 0.93 mg/dL (ref 0.61–1.24)
GFR, Estimated: >60 mL/min (ref >60)
Glucose, Bld: 90 mg/dL (ref 70–99)
Potassium: 3.4 mmol/L — ABNORMAL LOW (ref 3.5–5.1)
Sodium: 139 mmol/L (ref 135–145)
Total Bilirubin: 1 mg/dL (ref 0.3–1.2)
Total Protein: 6.7 g/dL (ref 6.5–8.1)

## 2021-04-20 LAB — CBC WITH DIFFERENTIAL/PLATELET
Abs Immature Granulocytes: 0.01 10*3/uL (ref 0.00–0.07)
Basophils Absolute: 0 10*3/uL (ref 0.0–0.1)
Basophils Relative: 1 %
Eosinophils Absolute: 0.1 10*3/uL (ref 0.0–0.5)
Eosinophils Relative: 1 %
HCT: 40.4 % (ref 39.0–52.0)
Hemoglobin: 13.7 g/dL (ref 13.0–17.0)
Immature Granulocytes: 0 %
Lymphocytes Relative: 25 %
Lymphs Abs: 1.7 10*3/uL (ref 0.7–4.0)
MCH: 28.2 pg (ref 26.0–34.0)
MCHC: 33.9 g/dL (ref 30.0–36.0)
MCV: 83.1 fL (ref 80.0–100.0)
Monocytes Absolute: 0.4 10*3/uL (ref 0.1–1.0)
Monocytes Relative: 6 %
Neutro Abs: 4.7 10*3/uL (ref 1.7–7.7)
Neutrophils Relative %: 67 %
Platelets: 327 10*3/uL (ref 150–400)
RBC: 4.86 MIL/uL (ref 4.22–5.81)
RDW: 13.7 % (ref 11.5–15.5)
WBC: 7 10*3/uL (ref 4.0–10.5)
nRBC: 0 % (ref 0.0–0.2)

## 2021-04-20 LAB — RESP PANEL BY RT-PCR (FLU A&B, COVID) ARPGX2
Influenza A by PCR: NEGATIVE
Influenza B by PCR: NEGATIVE
SARS Coronavirus 2 by RT PCR: NEGATIVE

## 2021-04-20 LAB — ACETAMINOPHEN LEVEL: Acetaminophen (Tylenol), Serum: <10 ug/mL — ABNORMAL LOW (ref 10–30)

## 2021-04-20 LAB — ETHANOL: Alcohol, Ethyl (B): <10 mg/dL (ref <10)

## 2021-04-20 LAB — RAPID URINE DRUG SCREEN, HOSP PERFORMED
Amphetamines: NOT DETECTED
Barbiturates: NOT DETECTED
Benzodiazepines: NOT DETECTED
Cocaine: POSITIVE — AB
Opiates: NOT DETECTED
Tetrahydrocannabinol: POSITIVE — AB

## 2021-04-20 LAB — SALICYLATE LEVEL: Salicylate Lvl: <7 mg/dL — ABNORMAL LOW (ref 7.0–30.0)

## 2021-04-20 NOTE — ED Triage Notes (Signed)
The pt reports that he has attempted x 3 to kill himself he does not take meds he denies weeing anyone for this

## 2021-04-20 NOTE — ED Provider Notes (Signed)
[facility] EMERGENCY DEPARTMENT Provider Note   CSN: [id number] Arrival date & time: 04/20/21  1541     History No chief complaint on file.   Tyler White is a 25 y.o. male.  HPI      Tyler White is a 25 y.o. male, with a history of schizophrenia, presenting to the ED with auditory hallucinations intermittently for the last year. States he is supposed to be on Risperdal and Invega, but has not had these medications in the last year. He states his auditory hallucinations are voices telling him to, "not talk to other people."  He also endorses suicidal ideations, but will not detail any specific plan. Denies history of self-harm or suicide attempts. Denies use of alcohol or illicit drugs.  Denies physical complaints.   Past Medical History:  Diagnosis Date  . Hernia, inguinal, right   . Inguinal hernia    right  . Psychiatric illness   . Schizophrenia [facility])     Patient Active Problem List   Diagnosis Date Noted  . Schizophrenia ([place]) 11/19/2020  . Cocaine abuse ([place]) 02/19/2018  . Cocaine abuse with cocaine-induced mood disorder ([place]) 02/19/2018  . Psychosis ([place]) 11/27/2017  . Cannabis abuse with psychotic disorder, with delusions ([place]) 11/26/2017  . Schizophrenia, unspecified ([place]) 11/26/2017    History reviewed. No pertinent surgical history.     Family History  Problem Relation Age of Onset  . Psychiatric Illness Mother   . Hypertension Sister     Social History   Tobacco Use  . Smoking status: Current Every Day Smoker    Packs/day: 0.50    Years: 5.00    Pack years: 2.50    Types: Cigarettes  . Smokeless tobacco: Never Used  Vaping Use  . Vaping Use: Never used  Substance Use Topics  . Alcohol use: Yes  . Drug use: Yes    Types: Marijuana    Home Medications Prior to Admission medications   Medication Sig Start Date End Date Taking? Authorizing Provider  ciprofloxacin (CIPRO) 500 MG tablet Take 1 tablet (500 mg  total) by mouth 2 (two) times daily. One po bid x 7 days 04/12/21   Charlesetta Shanks, MD  hydrOXYzine (ATARAX/VISTARIL) 25 MG tablet Take 1 tablet (25 mg total) by mouth 3 (three) times daily as needed for anxiety. Patient not taking: Reported on 04/12/2021 03/12/21   Ival Bible, MD  risperiDONE (RISPERDAL M-TABS) 2 MG disintegrating tablet Take 1 tablet (2 mg total) by mouth daily. Patient not taking: Reported on 04/12/2021 03/12/21 04/11/21  Ival Bible, MD    Allergies    Patient has no known allergies.  Review of Systems   Review of Systems  Constitutional: Negative for chills and fever.  Respiratory: Negative for shortness of breath.   Cardiovascular: Negative for chest pain.  Gastrointestinal: Negative for abdominal pain, diarrhea, nausea and vomiting.  Psychiatric/Behavioral: Positive for hallucinations and suicidal ideas.  All other systems reviewed and are negative.   Physical Exam Updated Vital Signs BP 126/85 (BP Location: Right Arm)   Pulse (!) 55   Temp 98.7 F (37.1 C) (Oral)   Resp 20   Ht 5\' 7"  (1.702 m)   Wt 65 kg   SpO2 100%   BMI 22.44 kg/m   Physical Exam Vitals and nursing note reviewed.  Constitutional:      General: He is not in acute distress.    Appearance: He is well-developed. He is not diaphoretic.  HENT:  Head: Normocephalic and atraumatic.     Mouth/Throat:     Mouth: Mucous membranes are moist.     Pharynx: Oropharynx is clear.  Eyes:     Conjunctiva/sclera: Conjunctivae normal.  Cardiovascular:     Rate and Rhythm: Normal rate and regular rhythm.     Pulses: Normal pulses.  Pulmonary:     Effort: Pulmonary effort is normal. No respiratory distress.  Abdominal:     Tenderness: There is no guarding.  Musculoskeletal:     Cervical back: Neck supple.  Skin:    General: Skin is warm and dry.  Neurological:     Mental Status: He is alert and oriented to person, place, and time.  Psychiatric:        Mood and Affect:  Mood normal. Affect is blunt.        Speech: Speech normal.        Behavior: Behavior is withdrawn.     Comments: Speaks quietly.  Poor eye contact.     ED Results / Procedures / Treatments   Labs (all labs ordered are listed, but only abnormal results are displayed) Labs Reviewed  COMPREHENSIVE METABOLIC PANEL - Abnormal; Notable for the following components:      Result Value   Potassium 3.4 (*)    All other components within normal limits  RAPID URINE DRUG SCREEN, HOSP PERFORMED - Abnormal; Notable for the following components:   Cocaine POSITIVE (*)    Tetrahydrocannabinol POSITIVE (*)    All other components within normal limits  ACETAMINOPHEN LEVEL - Abnormal; Notable for the following components:   Acetaminophen (Tylenol), Serum <10 (*)    All other components within normal limits  SALICYLATE LEVEL - Abnormal; Notable for the following components:   Salicylate Lvl <6.3 (*)    All other components within normal limits  RESP PANEL BY RT-PCR (FLU A&B, COVID) ARPGX2  ETHANOL  CBC WITH DIFFERENTIAL/PLATELET    EKG None  Radiology No results found.  Procedures Procedures   Medications Ordered in ED Medications - No data to display  ED Course  I have reviewed the triage vital signs and the nursing notes.  Pertinent labs & imaging results that were available during my care of the patient were reviewed by me and considered in my medical decision making (see chart for details).    MDM Rules/Calculators/A&P                          Patient presents complaining of auditory hallucinations with suicidal ideations. He has no current home medications ordered.  He is medically cleared.  Awaiting TTS assessment.  I expect this assessment will likely occur in the morning and patient will need to be here overnight.   Final Clinical Impression(s) / ED Diagnoses Final diagnoses:  None    Rx / DC Orders ED Discharge Orders    None       [name] 04/20/21  2319    Charlesetta Shanks, MD 05/01/21 1406

## 2021-04-20 NOTE — ED Notes (Signed)
Belongings inventoried and placed in locker #11 

## 2021-04-20 NOTE — ED Triage Notes (Signed)
Pt hearing voices for 8 months he denies being on meds

## 2021-04-20 NOTE — ED Triage Notes (Signed)
Emergency Medicine Provider Triage Evaluation Note  Tyler White , a 25 y.o. male  was evaluated in triage.  Pt complains of suicidal ideations.  Patient reports he has a plan to overdose on pills.  Patient reports he attempted to kill himself last night by holding his breath.  Patient endorses visual and auditory hallucinations.  Patient reports that he sees shadows that he believes are not there.  Patient also endorses hearing voices.  Patient endorses previous psychiatric inpatient admission.  Patient reports history of schizophrenia denies taking any medications.  Patient denies any alcohol use or drug use.  Patient denies any attempted overdose.  Review of Systems  Positive: Suicidal ideations, plan of suicide, auditory hallucinations, visual hallucinations Negative: Homicidal ideations  Physical Exam  BP 126/85 (BP Location: Right Arm)   Pulse (!) 55   Temp 98.7 F (37.1 C) (Oral)   Resp 20   SpO2 100%  Gen:   Awake, no distress   HEENT:  Atraumatic  Resp:  Normal effort  Cardiac:  Bradycardia at rate of 55 MSK:   Moves extremities without difficulty  Neuro:  Speech clear   Medical Decision Making  Medically screening exam initiated at 4:13 PM.  Appropriate orders placed.  Tyler White was informed that the remainder of the evaluation will be completed by another provider, this initial triage assessment does not replace that evaluation, and the importance of remaining in the ED until their evaluation is complete.  Clinical Impression   The patient appears stable so that the remainder of the work up may be completed by another provider.     Loni Beckwith, [place] 04/20/21 1629

## 2021-04-20 NOTE — ED Notes (Signed)
Pt wanded by security. 

## 2021-04-20 NOTE — ED Notes (Addendum)
Pt belongings in [place] [address], uninventoried

## 2021-04-21 NOTE — ED Provider Notes (Signed)
6:32 PM I was called back to bedside by RN as patient now voicing that he would like to go home.  I once again reiterated that behavioral health recommends inpatient treatment.  He states that he is no longer feeling suicidal.  He states that he is comfortable with going home and wants to go home because family "would worry about him" if he was not there.  Again patient is calm and cooperative.  He does not appear to be responding to any internal stimuli.  He has voiced several times now that he is no longer feeling suicidal.  I do not currently have any grounds for involuntary commitment.  Will allow him to leave.   BP 114/70 (BP Location: Left Arm)   Pulse (!) 54   Temp 97.8 F (36.6 C) (Oral)   Resp 16   Ht 5\' 7"  (1.702 m)   Wt 65 kg   SpO2 100%   BMI 22.44 kg/m     Carlisle Cater, Hershal Coria 04/21/21 1835    [name] Fuse, MD 04/22/21 1513

## 2021-04-21 NOTE — Discharge Instructions (Addendum)
Return to the Emergency Department if your suicidal feelings return or if you have any other concerns.

## 2021-04-21 NOTE — ED Notes (Signed)
Patient is resting comfortably. 

## 2021-04-21 NOTE — BH Assessment (Addendum)
Comprehensive Clinical Assessment (CCA) Note  04/21/2021 Tyler White [id number]  DISPOSITION: Chana Bode NP recommends a inpatient admission to assist with stabilization.   [facility] ED from 04/20/2021 in [place] [facility] from 04/12/2021 in [place] ED from 03/11/2021 in [place] CATEGORY High Risk No Risk High Risk    The patient demonstrates the following risk factors for suicide: Chronic risk factors for suicide include: psychiatric disorder of schizoaffective. Acute risk factors for suicide include: recent discharge from inpatient psychiatry. Protective factors for this patient include: UTA. Considering these factors, the overall suicide risk at this point appears to be moderate. Patient is appropriate for outpatient follow up.  Pt is a 25 year old male who presents unaccompanied to [facility] reporting visual hallucinations and suicidal ideation. Pt is vague in reference to content stating "I just see shadows." Pt renders limited history and is difficult to redirect. Pt reports he has a plan to "be dead" when asked in reference to intent. Pt will not elaborate on content of statement. Pt speaks in a low soft voice that is difficult to understand and stares at this writer without answering most assessment questions. Information to complete assessment was obtained from admission notes and chart review. Pt denies any SA history although is positive on 04/20/21 for cocaine.    EDP writes on arrival: Patient has a diagnosis of schizophrenia and states he has not taken psychiatric medications in weeks. Pt appears to be thought blocking and responding to internal stimuli. He is a poor historian, giving brief responses with significant delay. He also gives answers that are not consistent with information in Pt's medical record.  PLEASE NOTE THAT THE BELOW INFORMATION WAS OBTAINED  FROM A PRIOR ASSESSMENT FROM 03/11/21.  Pt was last assessed on 03/11/21 by Devoria Glassing [facility] who writes on that date: Pt is a 25 year old male who presents unaccompanied to [facility] reporting auditory and visual hallucinations and suicidal ideation. Pt has a diagnosis of schizophrenia and states he has not taken psychiatric medications in weeks. Pt appears to be thought blocking and responding to internal stimuli. He is a poor historian, giving brief responses with significant delay. He also gives answers that are not consistent with information in Pt's medical record.  Pt describes his mood as "instable" and Pt acknowledges symptoms including crying spells, social withdrawal, loss of interest in usual pleasures, fatigue, decreased concentration, and decreased sleep. He reports current suicidal ideation with no clear plan. He denies history of suicide attempts. He denies intentional self injurious behaviors. Pt reports he is hearing and seeing things but is unable to elaborate. He denies current homicidal ideation or history of violence. Pt denies alcohol or other substance use, however Pt's medical record indicates a history of using cocaine and marijuana. Pt's urine drug screen is in process.  Pt reports he is homeless and Pt's medical record indicates chronic homelessness for the past three years. He cannot identify any family or friends who are currently supportive. He denies history of abuse or trauma. He denies current legal problems. He denies access to firearms.   Pt states he has no outpatient mental health providers. He says he has taken Risperdal in the past. He denies any history of inpatient psychiatric treatment, however Pt was inpatient at [facility] in January 2022 and [facility] in November 2021. He presented with symptoms similar to tonight's presentation.  Pt is  dressed in hospital scrubs and will not respond to assessment questions.  Pt speaks in a soft tone, at low volume  and slow pace.  Pt's mood is depressed and affect is blunted.  Pt's thoughts are disorganized and judgment is impaired. Pt does not appear to be responding to internal stimuli at this time.   Chief Complaint: No chief complaint on file.  Visit Diagnosis:  F20.9 Schizophrenia   CCA Screening, Triage and Referral (STR)  Patient Reported Information How did you hear about [place]? Self  Referral name: [facility] [employer]  Referral phone number: No data recorded  Whom do you see for routine medical problems? I don't have a doctor  Practice/Facility Name: No data recorded Practice/Facility Phone Number: No data recorded Name of Contact: No data recorded Contact Number: No data recorded Contact Fax Number: No data recorded Prescriber Name: No data recorded Prescriber Address (if known): No data recorded  What Is the Reason for Your Visit/Call Today? Ongoing S/I and VH  How Long Has This Been Causing You Problems? 1-6 months  What Do You Feel Would Help You the Most Today? -- (UTA due to AMS)   Have You Recently Been in Any Inpatient Treatment (Hospital/Detox/Crisis Center/28-Day Program)? No  Name/Location of Program/Hospital:No data recorded How Long Were You There? No data recorded When Were You Discharged? No data recorded  Have You Ever Received Services From [facility] Before? Yes  Who Do You See at [facility]? Prior psych hx per chart review   Have You Recently Had Any Thoughts About Hurting Yourself? Yes  Are You Planning to Commit Suicide/Harm Yourself At This time? Yes   Have you Recently Had Thoughts About Hurting Someone [name]? No  Explanation: No data recorded  Have You Used Any Alcohol or Drugs in the Past 24 Hours? No  How Long Ago Did You Use Drugs or Alcohol? 0000 (UTA)  What Did You Use and How Much? Family reports that patient uses marijuana on a regular basis   Do You Currently Have a Therapist/Psychiatrist? No  Name of  Therapist/Psychiatrist: No data recorded  Have You Been Recently Discharged From Any Office Practice or Programs? No  Explanation of Discharge From Practice/Program: No data recorded    CCA Screening Triage Referral Assessment Type of Contact: Face-to-Face  Is this Initial or Reassessment? No data recorded Date Telepsych consult ordered in [facility]:  11/18/2020  Time Telepsych consult ordered in [facility]:  1335   Patient Reported Information Reviewed? Yes  Patient Left Without Being Seen? No data recorded Reason for Not Completing Assessment: No data recorded  Collateral Involvement: TTS was able to contact patient's father for collateral information as documented in narrative   Does Patient Have a [place]? No data recorded Name and Contact of Legal Guardian: No data recorded If Minor and Not Living with Parent(s), Who has Custody? No data recorded Is CPS involved or ever been involved? Never  Is APS involved or ever been involved? Never   Patient Determined To Be At Risk for Harm To Self or Others Based on Review of Patient Reported Information or Presenting Complaint? Yes, for Self-Harm  Method: No data recorded Availability of Means: No data recorded Intent: No data recorded Notification Required: No data recorded Additional Information for Danger to Others Potential: No data recorded Additional Comments for Danger to Others Potential: No data recorded Are There Guns or Other Weapons in Your Home? No data recorded Types of Guns/Weapons: No data recorded Are These  Weapons Safely Secured?                            No data recorded Who Could Verify You Are Able To Have These Secured: No data recorded Do You Have any Outstanding Charges, Pending Court Dates, Parole/Probation? No data recorded Contacted To Inform of Risk of Harm To Self or Others: Unable to Contact: (NA)   [facility] of Assessment: [facility]   Does Patient Present under Involuntary Commitment?  No  IVC Papers Initial File Date: No data recorded  [place] of Residence: [place]   Patient Currently Receiving the Following Services: -- (UTA)   Determination of Need: Urgent (48 hours)   Options For Referral: Medication Management     CCA Biopsychosocial Intake/Chief Complaint:  Pt has diagnosis of schizophrenia and is not taking medication. He states he is experiencing auditory and visual hallucinations. He reports suicidal ideation with no plan.  Current Symptoms/Problems: Pt appears to be thought blocking and responding to internal stimuli.   Patient Reported Schizophrenia/Schizoaffective Diagnosis in Past: Yes   Strengths: UTA  Preferences: Patient has no preferences that require accommodation  Abilities: UTA   Type of Services Patient Feels are Needed: Pt does not know   Initial Clinical Notes/Concerns: Pt appears to be thought blocking and is a poor historian.   Mental Health Symptoms Depression:  Change in energy/activity; Difficulty Concentrating; Sleep (too much or little); Tearfulness   Duration of Depressive symptoms: Greater than two weeks   Mania:  Change in energy/activity   Anxiety:   Difficulty concentrating; Sleep   Psychosis:  Grossly disorganized or catatonic behavior; Hallucinations   Duration of Psychotic symptoms: Greater than six months   Trauma:  None   Obsessions:  None   Compulsions:  None   Inattention:  None   Hyperactivity/Impulsivity:  N/A   Oppositional/Defiant Behaviors:  None   Emotional Irregularity:  None   Other Mood/Personality Symptoms:  NA    Mental Status Exam Appearance and self-care  Stature:  Average   Weight:  Thin   Clothing:  Disheveled   Grooming:  Neglected   Cosmetic use:  None   Posture/gait:  Normal   Motor activity:  Slowed   Sensorium  Attention:  Confused   Concentration:  Variable   Orientation:  Object; Person; Place; Time; Situation   Recall/memory:  Defective in  Remote   Affect and Mood  Affect:  Blunted; Flat   Mood:  Depressed   Relating  Eye contact:  Fleeting   Facial expression:  Depressed   Attitude toward examiner:  Cooperative; Guarded   Thought and Language  Speech flow: Paucity; Slow   Thought content:  Appropriate to Mood and Circumstances   Preoccupation:  None   Hallucinations:  Visual; Auditory   Organization:  No data recorded  [employer] of Knowledge:  Average   Intelligence:  Average   Abstraction:  Normal   Judgement:  Impaired   Reality Testing:  Adequate   Insight:  Lacking   Decision Making:  Only simple   Social Functioning  Social Maturity:  Isolates   Social Judgement:  "Games developer"   Stress  Stressors:  Housing; Teacher, music Ability:  Deficient supports   Skill Deficits:  Self-care; Decision making   Supports:  Support needed     Religion: Religion/Spirituality Are You A Religious Person?: Yes What is Your Religious Affiliation?: Christian How Might This Affect Treatment?: NA  Leisure/Recreation: Leisure / Recreation Do You Have Hobbies?: Yes Leisure and Hobbies: Reading about history  Exercise/Diet: Exercise/Diet Do You Exercise?: No Have You Gained or Lost A Significant Amount of Weight in the Past Six Months?: No Do You Follow a Special Diet?: No Do You Have Any Trouble Sleeping?: Yes Explanation of Sleeping Difficulties: Pt reports poor sleep   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 by current illness: No What is the longest time patient has a held a job?: N/A Where was the patient employed at that time?: N/A Has patient ever been in the [employer]?: No  Education: Education Last Grade Completed: 11 Did Teacher, adult education From [employer]?: No Did Physicist, medical?: No Did Heritage manager?: No Did You Have Any Special  Interests In School?: History Did You Have An Individualized Education Program (IIEP): No Did You Have Any Difficulty At [employer]?: No   CCA Family/Childhood History Family and Relationship History: Family history Marital status: Single Are you sexually active?:  (unable to assess) What is your sexual orientation?: Heterosexual Has your sexual activity been affected by drugs, alcohol, medication, or emotional stress?: NA Does patient have children?: Yes How many children?: 1 How is patient's relationship with their children?: Pt reports having a 105 year old daughter who lives with her mother  Childhood History:  Childhood History By whom was/is the patient raised?: Mother Description of patient's relationship with caregiver when they were a child: Patient was closest to his mother growing up, but later went to live with his father Patient's description of current relationship with people who raised him/her: "Okay" How were you disciplined when you got in trouble as a child/adolescent?: unable to assess Does patient have siblings?: Yes Description of patient's current relationship with siblings: "Fine" Did patient suffer any verbal/emotional/physical/sexual abuse as a child?: No Did patient suffer from severe childhood neglect?: No Has patient ever been sexually abused/assaulted/raped as an adolescent or adult?: No Was the patient ever a victim of a crime or a disaster?: No Witnessed domestic violence?: No Has patient been affected by domestic violence as an adult?: No  Child/Adolescent Assessment:     CCA Substance Use Alcohol/Drug Use: Alcohol / Drug Use Pain Medications: See MAR Prescriptions: See MAR Over the Counter: See MAR History of alcohol / drug use?: Yes Longest period of sobriety (when/how long): none reported Negative Consequences of Use: Financial,Personal relationships Substance #1 Name of Substance 1: Cocaine 1 - Age of First Use: Unknown 1 - Amount  (size/oz): Unknown 1 - Frequency: Unknown 1 - Duration: Unknown 1 - Last Use / Amount: Unknown 1 - Method of Aquiring: Unknown Substance #2 Name of Substance 2: Marijuana 2 - Age of First Use: Unknown 2 - Amount (size/oz): Unknown 2 - Frequency: Unknown 2 - Duration: Unknown 2 - Last Use / Amount: Unknown 2 - Method of Aquiring: Unknown 2 - Route of Substance Use: Unknown                     ASAM's:  Six Dimensions of Multidimensional Assessment  Dimension 1:  Acute Intoxication and/or Withdrawal Potential:   Dimension 1:  Description of individual's past and current experiences of substance use and withdrawal: Patient does not report experiencing any complications with withdrawal symptoms  Dimension 2:  Biomedical Conditions and Complications:   Dimension 2:  Description of patient's biomedical conditions and  complications: Patient does not report any medical complications that  are exacerbated by his marijuana use  Dimension 3:  Emotional, Behavioral, or Cognitive Conditions and Complications:  Dimension 3:  Description of emotional, behavioral, or cognitive conditions and complications: Patient is often non-compliant with taking his mental health medications and uses  Dimension 4:  Readiness to Change:  Dimension 4:  Description of Readiness to Change criteria: Patient has a history of non-compliance with treatment and has shown resistance to change especially concerning his drug use.  He does not seem to understand to relationship between his drug use and complications with his mental illness  Dimension 5:  Relapse, Continued use, or Continued Problem Potential:  Dimension 5:  Relapse, continued use, or continued problem potential critiera description: Patient has no significant period of abstinence and  Dimension 6:  Recovery/Living Environment:  Dimension 6:  Recovery/Iiving environment criteria description: Homeless  ASAM Severity Score: ASAM's Severity Rating Score: 12   ASAM Recommended Level of Treatment: ASAM Recommended Level of Treatment: Level II Intensive Outpatient Treatment   Substance use Disorder (SUD) Substance Use Disorder (SUD)  Checklist Symptoms of Substance Use: Continued use despite having a persistent/recurrent physical/psychological problem caused/exacerbated by use,Continued use despite persistent or recurrent social, interpersonal problems, caused or exacerbated by use,Social, occupational, recreational activities given up or reduced due to use,Large amounts of time spent to obtain, use or recover from the substance(s)  Recommendations for Services/Supports/Treatments: Recommendations for Services/Supports/Treatments Recommendations For Services/Supports/Treatments: Inpatient Hospitalization  DSM5 Diagnoses: Patient Active Problem List   Diagnosis Date Noted  . Schizophrenia ([place]) 11/19/2020  . Cocaine abuse ([place]) 02/19/2018  . Cocaine abuse with cocaine-induced mood disorder ([place]) 02/19/2018  . Psychosis ([place]) 11/27/2017  . Cannabis abuse with psychotic disorder, with delusions ([place]) 11/26/2017  . Schizophrenia, unspecified ([place]) 11/26/2017    Patient Centered Plan: Patient is on the following Treatment Plan(s):    Referrals to Alternative Service(s): Referred to Alternative Service(s):   Place:   Date:   Time:    Referred to Alternative Service(s):   Place:   Date:   Time:    Referred to Alternative Service(s):   Place:   Date:   Time:    Referred to Alternative Service(s):   Place:   Date:   Time:     Mamie Nick, LCAS

## 2021-04-21 NOTE — ED Notes (Signed)
Sitter at bedside.

## 2021-04-21 NOTE — ED Provider Notes (Signed)
Emergency Medicine Observation Re-evaluation Note  Tyler White is a 25 y.o. male, seen on rounds today.  Pt initially presented to the ED for complaints of suicidal ideation and hallucinations. Currently, the patient is calm.  When asked, he states that suicidal rations are improved.  Physical Exam  BP 114/70 (BP Location: Left Arm)   Pulse (!) 54   Temp 97.8 F (36.6 C) (Oral)   Resp 16   Ht 5\' 7"  (1.702 m)   Wt 65 kg   SpO2 100%   BMI 22.44 kg/m    Physical Exam General: No acute distress Cardiac: No murmurs, rubs or gallops Lungs: No respiratory distress Psych: Calm, does not appear to be responding to internal stimuli  ED Course / MDM  EKG:EKG Interpretation  Date/Time:  Friday April 20 2021 21:44:18 EDT Ventricular Rate:  49 PR Interval:  156 QRS Duration: 90 QT Interval:  398 QTC Calculation: 359 R Axis:   86 Text Interpretation: Sinus bradycardia Otherwise normal ECG When compared with ECG of 11/19/2020, No significant change was found Confirmed by Delora Fuel ([id number]) on 04/21/2021 1:07:44 AM  I have reviewed the labs performed to date as well as medications administered while in observation.  Recent changes in the last 24 hours include none.  Plan  Current plan is for behavioral health has evaluated the patient and recommended inpatient treatment.  Patient is currently voluntary.  Patient currently agrees to stay for inpatient psychiatric treatment.  I do not see any grounds for involuntary commitment at this time.  Patient is not under full IVC at this time.   Carlisle Cater, PA-C 04/21/21 1745    [name] Fuse, MD 04/22/21 [phone]

## 2021-04-22 ENCOUNTER — Other Ambulatory Visit: Payer: Self-pay

## 2021-04-22 ENCOUNTER — Emergency Department (HOSPITAL_COMMUNITY)
Admission: EM | Admit: 2021-04-22 | Discharge: 2021-04-23 | Disposition: A | Discharge status: Other Acute Inpatient Hospital | Payer: Medicaid Other | Source: Home / Self Care | Attending: Emergency Medicine | Admitting: Emergency Medicine

## 2021-04-22 ENCOUNTER — Encounter (HOSPITAL_COMMUNITY): Payer: Self-pay

## 2021-04-22 DIAGNOSIS — R45851 Suicidal ideations: Secondary | ICD-10-CM | POA: Insufficient documentation

## 2021-04-22 DIAGNOSIS — Z20822 Contact with and (suspected) exposure to covid-19: Secondary | ICD-10-CM | POA: Insufficient documentation

## 2021-04-22 DIAGNOSIS — F1410 Cocaine abuse, uncomplicated: Secondary | ICD-10-CM | POA: Diagnosis present

## 2021-04-22 DIAGNOSIS — F1910 Other psychoactive substance abuse, uncomplicated: Secondary | ICD-10-CM

## 2021-04-22 DIAGNOSIS — F32A Depression, unspecified: Secondary | ICD-10-CM

## 2021-04-22 DIAGNOSIS — F17210 Nicotine dependence, cigarettes, uncomplicated: Secondary | ICD-10-CM | POA: Insufficient documentation

## 2021-04-22 DIAGNOSIS — F12150 Cannabis abuse with psychotic disorder with delusions: Secondary | ICD-10-CM | POA: Diagnosis present

## 2021-04-22 DIAGNOSIS — F1414 Cocaine abuse with cocaine-induced mood disorder: Secondary | ICD-10-CM | POA: Diagnosis present

## 2021-04-22 DIAGNOSIS — F19959 Other psychoactive substance use, unspecified with psychoactive substance-induced psychotic disorder, unspecified: Secondary | ICD-10-CM | POA: Diagnosis present

## 2021-04-22 DIAGNOSIS — Z5900 Homelessness unspecified: Secondary | ICD-10-CM

## 2021-04-22 DIAGNOSIS — F209 Schizophrenia, unspecified: Secondary | ICD-10-CM | POA: Diagnosis present

## 2021-04-22 LAB — COMPREHENSIVE METABOLIC PANEL
ALT: 17 U/L (ref 0–44)
AST: 27 U/L (ref 15–41)
Albumin: 4.3 g/dL (ref 3.5–5.0)
Alkaline Phosphatase: 54 U/L (ref 38–126)
Anion gap: 6 (ref 5–15)
BUN: 13 mg/dL (ref 6–20)
CO2: 27 mmol/L (ref 22–32)
Calcium: 9.4 mg/dL (ref 8.9–10.3)
Chloride: 105 mmol/L (ref 98–111)
Creatinine, Ser: 1.07 mg/dL (ref 0.61–1.24)
GFR, Estimated: >60 mL/min (ref >60)
Glucose, Bld: 87 mg/dL (ref 70–99)
Potassium: 3.8 mmol/L (ref 3.5–5.1)
Sodium: 138 mmol/L (ref 135–145)
Total Bilirubin: 0.6 mg/dL (ref 0.3–1.2)
Total Protein: 6.8 g/dL (ref 6.5–8.1)

## 2021-04-22 LAB — CBC WITH DIFFERENTIAL/PLATELET
Abs Immature Granulocytes: 0.02 10*3/uL (ref 0.00–0.07)
Basophils Absolute: 0.1 10*3/uL (ref 0.0–0.1)
Basophils Relative: 1 %
Eosinophils Absolute: 0.4 10*3/uL (ref 0.0–0.5)
Eosinophils Relative: 4 %
HCT: 43.2 % (ref 39.0–52.0)
Hemoglobin: 14.3 g/dL (ref 13.0–17.0)
Immature Granulocytes: 0 %
Lymphocytes Relative: 40 %
Lymphs Abs: 3.4 10*3/uL (ref 0.7–4.0)
MCH: 28.3 pg (ref 26.0–34.0)
MCHC: 33.1 g/dL (ref 30.0–36.0)
MCV: 85.4 fL (ref 80.0–100.0)
Monocytes Absolute: 0.7 10*3/uL (ref 0.1–1.0)
Monocytes Relative: 8 %
Neutro Abs: 4 10*3/uL (ref 1.7–7.7)
Neutrophils Relative %: 47 %
Platelets: 331 10*3/uL (ref 150–400)
RBC: 5.06 MIL/uL (ref 4.22–5.81)
RDW: 13.7 % (ref 11.5–15.5)
WBC: 8.5 10*3/uL (ref 4.0–10.5)
nRBC: 0 % (ref 0.0–0.2)

## 2021-04-22 LAB — ETHANOL: Alcohol, Ethyl (B): <10 mg/dL (ref <10)

## 2021-04-22 LAB — SALICYLATE LEVEL: Salicylate Lvl: <7 mg/dL — ABNORMAL LOW (ref 7.0–30.0)

## 2021-04-22 LAB — ACETAMINOPHEN LEVEL: Acetaminophen (Tylenol), Serum: <10 ug/mL — ABNORMAL LOW (ref 10–30)

## 2021-04-22 NOTE — ED Triage Notes (Signed)
Having suicidal and homicidal hallucinations x 1 year.   Calm and cooperative in triage. Pt is homeless. Plans to overdose on pills.

## 2021-04-22 NOTE — ED Provider Notes (Signed)
[facility] EMERGENCY DEPARTMENT Provider Note   CSN: [id number] Arrival date & time: 04/22/21  2032     History Chief Complaint  Patient presents with  . Suicidal  . Homicidal    Tyler White is a 25 y.o. male.   25 year old male with a history of schizophrenia and psychiatric illness, cocaine abuse presents to the emergency department reporting suicidal ideations and auditory/visual hallucinations.  Expressed plans to overdose in triage.  Presently states that he is not suicidal and has no plan.  Denies owning or having access to any firearms or weapons.  He is presently homeless.  Cannot recall his last use of alcohol or illicit drugs.  Was evaluated in the ED yesterday for similar complaints and recommended for inpatient psychiatric treatment.  Left prior to transfer to [facility].  The history is provided by the patient. No language interpreter was used.       Past Medical History:  Diagnosis Date  . Hernia, inguinal, right   . Inguinal hernia    right  . Psychiatric illness   . Schizophrenia [facility])     Patient Active Problem List   Diagnosis Date Noted  . Schizophrenia ([place]) 11/19/2020  . Cocaine abuse ([place]) 02/19/2018  . Cocaine abuse with cocaine-induced mood disorder ([place]) 02/19/2018  . Psychosis ([place]) 11/27/2017  . Cannabis abuse with psychotic disorder, with delusions ([place]) 11/26/2017  . Schizophrenia, unspecified ([place]) 11/26/2017    History reviewed. No pertinent surgical history.     Family History  Problem Relation Age of Onset  . Psychiatric Illness Mother   . Hypertension Sister     Social History   Tobacco Use  . Smoking status: Current Every Day Smoker    Packs/day: 0.50    Years: 5.00    Pack years: 2.50    Types: Cigarettes  . Smokeless tobacco: Never Used  Vaping Use  . Vaping Use: Never used  Substance Use Topics  . Alcohol use: Yes  . Drug use: Yes    Types: Marijuana    Home Medications Prior to Admission  medications   Medication Sig Start Date End Date Taking? Authorizing Provider  risperiDONE (RISPERDAL M-TABS) 2 MG disintegrating tablet Take 1 tablet (2 mg total) by mouth daily. Patient not taking: Reported on 04/12/2021 03/12/21 04/11/21  Ival Bible, MD    Allergies    Patient has no known allergies.  Review of Systems   Review of Systems  Ten systems reviewed and are negative for acute change, except as noted in the HPI.    Physical Exam Updated Vital Signs BP (!) 127/95 (BP Location: Right Arm)   Pulse (!) 56   Temp 99 F (37.2 C) (Oral)   Resp 16   Ht 5\' 7"  (1.702 m)   Wt 65 kg   SpO2 99%   BMI 22.44 kg/m   Physical Exam Vitals and nursing note reviewed.  Constitutional:      General: He is not in acute distress.    Appearance: He is well-developed. He is not diaphoretic.     Comments: Nontoxic appearing and in NAD  HENT:     Head: Normocephalic and atraumatic.  Eyes:     General: No scleral icterus.    Conjunctiva/sclera: Conjunctivae normal.  Pulmonary:     Effort: Pulmonary effort is normal. No respiratory distress.     Comments: Respirations even and unlabored Musculoskeletal:        General: Normal range of motion.     Cervical  back: Normal range of motion.  Skin:    General: Skin is warm and dry.     Coloration: Skin is not pale.     Findings: No erythema or rash.  Neurological:     Mental Status: He is alert and oriented to person, place, and time.     Coordination: Coordination normal.  Psychiatric:        Behavior: Behavior normal.     Comments: Evasive to questioning. Gives one word answers. No reacting to internal stimuli. Denies SI at this time.     ED Results / Procedures / Treatments   Labs (all labs ordered are listed, but only abnormal results are displayed) Labs Reviewed  RAPID URINE DRUG SCREEN, HOSP PERFORMED - Abnormal; Notable for the following components:      Result Value   Cocaine POSITIVE (*)    Tetrahydrocannabinol  POSITIVE (*)    All other components within normal limits  SALICYLATE LEVEL - Abnormal; Notable for the following components:   Salicylate Lvl <8.5 (*)    All other components within normal limits  ACETAMINOPHEN LEVEL - Abnormal; Notable for the following components:   Acetaminophen (Tylenol), Serum <10 (*)    All other components within normal limits  RESP PANEL BY RT-PCR (FLU A&B, COVID) ARPGX2  COMPREHENSIVE METABOLIC PANEL  ETHANOL  CBC WITH DIFFERENTIAL/PLATELET    EKG None  Radiology No results found.  Procedures Procedures   Medications Ordered in ED Medications - No data to display  ED Course  I have reviewed the triage vital signs and the nursing notes.  Pertinent labs & imaging results that were available during my care of the patient were reviewed by me and considered in my medical decision making (see chart for details).  Clinical Course as of 04/23/21 0618  Mon Apr 23, 2021  0301 TTS recommending inpatient psychiatric care. Pending placement. Not appropriate for tx to [facility] due to hx of inappropriate behavior. [KH]    Clinical Course User Index [KH] Beverely Pace   MDM Rules/Calculators/A&P                          25 year old male presents to the emergency department for evaluation of depression with suicidal ideations.  During my assessment he now denies SI.  He is homeless.  UDS positive for cocaine and marijuana.  He has been evaluated by TTS who recommend inpatient treatment.  Placement presently pending.  Disposition to be determined by oncoming ED provider.   Final Clinical Impression(s) / ED Diagnoses Final diagnoses:  Polysubstance abuse ([place])  Homelessness  Depression with suicidal ideation    Rx / DC Orders ED Discharge Orders    None       Antonietta Breach, PA-C 04/23/21 0277    Drenda Freeze, MD 04/24/21 2235

## 2021-04-22 NOTE — ED Triage Notes (Signed)
Emergency Medicine Provider Triage Evaluation Note  Tyler White , a 25 y.o. male  was evaluated in triage.  Pt complains of SI and auditory/visual hallucinations. He notes he has plans to overdose. No weapons at home. He was evaluated in the ED yesterday where inpatient treatment was recommended; however patient left. No physical complaints.  Review of Systems  Positive: SI   Physical Exam  BP (!) 127/95 (BP Location: Right Arm)   Pulse (!) 56   Temp 99 F (37.2 C) (Oral)   Resp 16   Ht 5\' 7"  (1.702 m)   Wt 65 kg   SpO2 99%   BMI 22.44 kg/m  Gen:   Awake, no distress   HEENT:  Atraumatic  Resp:  Normal effort  Cardiac:  Normal rate  Abd:   Nondistended, nontender  MSK:   Moves extremities without difficulty  Neuro:  Speech clear   Medical Decision Making  Medically screening exam initiated at 9:09 PM.  Appropriate orders placed.  Tyler White was informed that the remainder of the evaluation will be completed by another provider, this initial triage assessment does not replace that evaluation, and the importance of remaining in the ED until their evaluation is complete.  Clinical Impression  Medical clearance labs ordered.   Suzy Bouchard, [place] 04/22/21 2112

## 2021-04-23 ENCOUNTER — Encounter (HOSPITAL_COMMUNITY): Payer: Self-pay | Admitting: Registered Nurse

## 2021-04-23 ENCOUNTER — Other Ambulatory Visit: Payer: Self-pay | Admitting: Registered Nurse

## 2021-04-23 ENCOUNTER — Inpatient Hospital Stay (HOSPITAL_COMMUNITY)
Admission: AD | Admit: 2021-04-23 | Discharge: 2021-04-30 | DRG: 885 | Disposition: A | Discharge status: Home / Self Care | Payer: Medicaid Other | Source: Intra-hospital | Attending: Psychiatry | Admitting: Psychiatry

## 2021-04-23 DIAGNOSIS — F19959 Other psychoactive substance use, unspecified with psychoactive substance-induced psychotic disorder, unspecified: Secondary | ICD-10-CM | POA: Diagnosis present

## 2021-04-23 DIAGNOSIS — F1410 Cocaine abuse, uncomplicated: Secondary | ICD-10-CM | POA: Diagnosis present

## 2021-04-23 DIAGNOSIS — F250 Schizoaffective disorder, bipolar type: Secondary | ICD-10-CM | POA: Diagnosis present

## 2021-04-23 DIAGNOSIS — F1210 Cannabis abuse, uncomplicated: Secondary | ICD-10-CM | POA: Diagnosis present

## 2021-04-23 DIAGNOSIS — R45851 Suicidal ideations: Secondary | ICD-10-CM | POA: Diagnosis present

## 2021-04-23 DIAGNOSIS — Z79899 Other long term (current) drug therapy: Secondary | ICD-10-CM

## 2021-04-23 DIAGNOSIS — F1211 Cannabis abuse, in remission: Secondary | ICD-10-CM | POA: Diagnosis present

## 2021-04-23 DIAGNOSIS — Z20822 Contact with and (suspected) exposure to covid-19: Secondary | ICD-10-CM | POA: Diagnosis present

## 2021-04-23 DIAGNOSIS — F17210 Nicotine dependence, cigarettes, uncomplicated: Secondary | ICD-10-CM | POA: Diagnosis present

## 2021-04-23 DIAGNOSIS — Z9114 Patient's other noncompliance with medication regimen: Secondary | ICD-10-CM | POA: Diagnosis not present

## 2021-04-23 DIAGNOSIS — Z5900 Homelessness unspecified: Secondary | ICD-10-CM

## 2021-04-23 DIAGNOSIS — F200 Paranoid schizophrenia: Secondary | ICD-10-CM | POA: Diagnosis not present

## 2021-04-23 DIAGNOSIS — G4700 Insomnia, unspecified: Secondary | ICD-10-CM | POA: Diagnosis present

## 2021-04-23 DIAGNOSIS — F1910 Other psychoactive substance abuse, uncomplicated: Secondary | ICD-10-CM | POA: Diagnosis present

## 2021-04-23 LAB — RAPID URINE DRUG SCREEN, HOSP PERFORMED
Amphetamines: NOT DETECTED
Barbiturates: NOT DETECTED
Benzodiazepines: NOT DETECTED
Cocaine: POSITIVE — AB
Opiates: NOT DETECTED
Tetrahydrocannabinol: POSITIVE — AB

## 2021-04-23 LAB — RESP PANEL BY RT-PCR (FLU A&B, COVID) ARPGX2
Influenza A by PCR: NEGATIVE
Influenza B by PCR: NEGATIVE
SARS Coronavirus 2 by RT PCR: NEGATIVE

## 2021-04-23 MED ORDER — RISPERIDONE 2 MG PO TBDP
2.0000 mg | ORAL_TABLET | Freq: Every day | ORAL | Status: DC
  Administered 2021-04-23 – 2021-04-29 (×7): 2 mg via ORAL
  Filled 2021-04-23 (×4): qty 1
  Filled 2021-04-23: qty 7
  Filled 2021-04-23 (×2): qty 1
  Filled 2021-04-23: qty 7
  Filled 2021-04-23 (×2): qty 1

## 2021-04-23 MED ORDER — MAGNESIUM HYDROXIDE 400 MG/5ML PO SUSP: 30.0000 mL | Freq: Every day | ORAL | Status: DC | PRN

## 2021-04-23 MED ORDER — RISPERIDONE 2 MG PO TBDP
2.0000 mg | ORAL_TABLET | Freq: Every day | ORAL | Status: DC
  Administered 2021-04-23: 2 mg via ORAL
  Filled 2021-04-23: qty 1

## 2021-04-23 MED ORDER — RISPERIDONE 2 MG PO TBDP: 2.0000 mg | ORAL_TABLET | Freq: Three times a day (TID) | ORAL | Status: DC | PRN

## 2021-04-23 MED ORDER — RISPERIDONE 2 MG PO TBDP
2.0000 mg | ORAL_TABLET | Freq: Every day | ORAL | Status: DC
  Filled 2021-04-23: qty 1

## 2021-04-23 MED ORDER — LORAZEPAM 1 MG PO TABS: 1.0000 mg | ORAL_TABLET | Freq: Four times a day (QID) | ORAL | Status: DC | PRN

## 2021-04-23 MED ORDER — ACETAMINOPHEN 325 MG PO TABS
650.0000 mg | ORAL_TABLET | Freq: Four times a day (QID) | ORAL | Status: DC | PRN
Start: 2021-04-23 — End: 2021-04-30
  Administered 2021-04-27 (×2): 650 mg via ORAL
  Filled 2021-04-23 (×3): qty 2

## 2021-04-23 MED ORDER — RISPERIDONE 1 MG PO TBDP
1.0000 mg | ORAL_TABLET | Freq: Every day | ORAL | Status: DC
  Administered 2021-04-23 – 2021-04-26 (×4): 1 mg via ORAL
  Filled 2021-04-23 (×6): qty 1

## 2021-04-23 MED ORDER — ALUM & MAG HYDROXIDE-SIMETH 200-200-20 MG/5ML PO SUSP: 30.0000 mL | ORAL | Status: DC | PRN

## 2021-04-23 MED ORDER — HYDROXYZINE HCL 25 MG PO TABS
25.0000 mg | ORAL_TABLET | Freq: Three times a day (TID) | ORAL | Status: DC | PRN
  Administered 2021-04-24 – 2021-04-29 (×6): 25 mg via ORAL
  Filled 2021-04-23 (×2): qty 1
  Filled 2021-04-23: qty 10
  Filled 2021-04-23 (×4): qty 1

## 2021-04-23 MED ORDER — GABAPENTIN 300 MG PO CAPS
300.0000 mg | ORAL_CAPSULE | Freq: Three times a day (TID) | ORAL | Status: DC
  Administered 2021-04-23 – 2021-04-25 (×4): 300 mg via ORAL
  Filled 2021-04-23 (×8): qty 1

## 2021-04-23 MED ORDER — TRAZODONE HCL 50 MG PO TABS
50.0000 mg | ORAL_TABLET | Freq: Every evening | ORAL | Status: DC | PRN
  Administered 2021-04-24 – 2021-04-29 (×4): 50 mg via ORAL
  Filled 2021-04-23: qty 7
  Filled 2021-04-23 (×4): qty 1

## 2021-04-23 MED ORDER — BENZTROPINE MESYLATE 0.5 MG PO TABS
0.5000 mg | ORAL_TABLET | Freq: Two times a day (BID) | ORAL | Status: DC | PRN
  Filled 2021-04-23: qty 14

## 2021-04-23 MED ORDER — ZIPRASIDONE MESYLATE 20 MG IM SOLR: 20.0000 mg | Freq: Four times a day (QID) | INTRAMUSCULAR | Status: DC | PRN
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
## 2021-04-23 NOTE — Progress Notes (Signed)
Adult Psychoeducational Group Note  Date:  04/23/2021 Time:  10:25 PM  Group Topic/Focus:  Wrap-Up Group:   The focus of this group is to help patients review their daily goal of treatment and discuss progress on daily workbooks.  Participation Level:  Minimal  Participation Quality:  Appropriate  Affect:  Appropriate  Cognitive:  Oriented  Insight: Limited  Engagement in Group:  Engaged  Modes of Intervention:  Education  Additional Comments:  Patient attended and participated in group tonight.   Salley Scarlet [facility] 04/23/2021, 10:25 PM

## 2021-04-23 NOTE — Progress Notes (Addendum)
   04/23/21 2153  Psych Admission Type (Psych Patients Only)  Admission Status Voluntary  Psychosocial Assessment  Patient Complaints None  Eye Contact Avoids  Facial Expression Flat;Pensive;Sullen;Sad  Affect Anxious;Blunted;Depressed;Preoccupied;Sad  Speech Slow  Interaction Evasive;Guarded  Motor Activity Slow  Appearance/Hygiene Unremarkable  Behavior Characteristics Cooperative;Anxious  Mood Anxious;Suspicious;Preoccupied  Thought Process  Coherency Blocking;Disorganized  Content Preoccupation;Paranoia  Delusions None reported or observed  Perception Hallucinations  Hallucination None reported or observed  Judgment Poor  Confusion UTA  Danger to Self  Current suicidal ideation? Denies  Danger to Others  Danger to Others None reported or observed   Pt seen at nurse's station. Preoccupied with wanting to leave facility. Pt told he would need to speak with provider in the morning. Pt denies SI, HI, AVH and pain. Delayed responses. Shaking head - elective mutism.

## 2021-04-23 NOTE — ED Notes (Signed)
Attempted to give report to [facility]. RN unable to take report.

## 2021-04-23 NOTE — Tx Team (Signed)
Initial Treatment Plan 04/23/2021 4:22 PM Tyler White SWN:[id number]    PATIENT STRESSORS: Financial difficulties Other: lack of housing   PATIENT STRENGTHS: Ability for insight Communication skills Physical Health   PATIENT IDENTIFIED PROBLEMS: Anxiety   depression  SI  Lack of housing                DISCHARGE CRITERIA:  Ability to meet basic life and health needs Adequate post-discharge living arrangements Improved stabilization in mood, thinking, and/or behavior Medical problems require only outpatient monitoring Motivation to continue treatment in a less acute level of care Need for constant or close observation no longer present Reduction of life-threatening or endangering symptoms to within safe limits Safe-care adequate arrangements made  PRELIMINARY DISCHARGE PLAN: Attend aftercare/continuing care group Placement in alternative living arrangements  PATIENT/FAMILY INVOLVEMENT: This treatment plan has been presented to and reviewed with the patient, Tyler White.  The patient has been given the opportunity to ask questions and make suggestions.  Roxanna Mew, RN 04/23/2021, 4:22 PM

## 2021-04-23 NOTE — ED Notes (Signed)
TTS started in room 36 at this time.

## 2021-04-23 NOTE — Progress Notes (Signed)
Patient was accepted to [facility].  Meets inpatient criteria per Dr. Dwyane Dee.    Attending physician is Dr. Mallie Darting.    Notified Roma Kayser, RN of acceptance.  Nurses call report to [phone].    Patient can arrive at 04/23/2021 at 11:45AM. .  Assunta Curtis, MSW, LCSW 04/23/2021 11:37 AM

## 2021-04-23 NOTE — ED Notes (Signed)
Patient offered HUSH pack, refused at this time.
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
## 2021-04-24 DIAGNOSIS — F200 Paranoid schizophrenia: Secondary | ICD-10-CM

## 2021-04-24 NOTE — Progress Notes (Signed)
Pt did not attend orientation group.  

## 2021-04-24 NOTE — Progress Notes (Signed)
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  Leisure Interests (2+):   ("not sure")  Awareness of Community Resources:  No  Expressed Interest in [employer] Information: No  County of Residence:  [place]  Patient Main Form of Transportation: Walk  Patient Strengths:  Reading; Writing  Patient Identified Areas of Improvement:  Mental health; Panic attacks  Patient Goal for Hospitalization:  "use resources"  Current SI (including self-harm):  No  Current HI:  No  Current AVH: No  Staff Intervention Plan: Group Attendance,Collaborate with Interdisciplinary Treatment Team  Consent to Intern Participation: N/A   Victorino Sparrow, LRT/CTRS   Victorino Sparrow A 04/24/2021, 11:36 AM

## 2021-04-24 NOTE — H&P (Signed)
Psychiatric Admission Assessment Adult  Patient Identification: Tyler White MRN:  [id number] Date of Evaluation:  04/24/2021 Chief Complaint:  Substance-induced psychotic disorder [facility]) [F19.959] Principal Diagnosis: <principal problem not specified> Diagnosis:  Active Problems:   Substance-induced psychotic disorder ([place])  History of Present Illness: Patient is seen and examined.  Patient is a 25 year old male with a past psychiatric history significant for schizophrenia versus schizoaffective disorder who originally presented to the [facility] emergency department on 04/20/2021 with suicidal ideation.  He stated he had a plan to overdose on pills.  He also endorsed auditory as well as visual hallucinations.  Patient is well-known to our service from multiple psychiatric hospitalizations in the past.  He is a poor historian.  He does not provide a great deal of answers.  Patient admitted noncompliance with his psychiatric medications.  He stated at least "weeks".  He appeared to be thought blocking on the evaluation of social work, and admitted to feeling helpless, hopeless and worthless.  He denied any plan.  He does have chronic homelessness for the last 3 years.  His last psychiatric hospitalization in the electronic medical record was on 01/15/2021.  Diagnosis at that time was cocaine abuse.  He was discharged from the [place] at [facility] on Risperdal 1 mg p.o. nightly.  As stated above his last hospitalization in our facility was on 11/19/2020.  His discharge medications at that time included Cogentin, BuSpar, Depakote, gabapentin, hydroxyzine, the long-acting paliperidone injection and oral Risperdal.  On his admission orders last night he was restarted on Risperdal 1 mg p.o. daily and 2 mg p.o. nightly.  He was also placed on Neurontin 300 mg p.o. 3 times daily for mood stability.  He was admitted to the hospital for evaluation and stabilization.  Associated  Signs/Symptoms: Depression Symptoms:  depressed mood, anhedonia, insomnia, psychomotor agitation, fatigue, hopelessness, anxiety, panic attacks, loss of energy/fatigue, disturbed sleep, Duration of Depression Symptoms: Greater than two weeks  (Hypo) Manic Symptoms:  Delusions, Hallucinations, Impulsivity, Irritable Mood, Labiality of Mood, Anxiety Symptoms:  Excessive Worry, Psychotic Symptoms:  Delusions, Hallucinations: Auditory Paranoia, PTSD Symptoms: Negative Total Time spent with patient: 30 minutes  Past Psychiatric History: Patient has had multiple psychiatric hospitalizations secondary to schizophrenia as well as polysubstance use disorders including cocaine.  His last admission to our facility was in November 2021.  His last psychiatric hospitalization was at [facility] in January 2022.  Is the patient at risk to self? Yes.    Has the patient been a risk to self in the past 6 months? Yes.    Has the patient been a risk to self within the distant past? Yes.    Is the patient a risk to others? No.  Has the patient been a risk to others in the past 6 months? No.  Has the patient been a risk to others within the distant past? No.   Prior Inpatient Therapy:   Prior Outpatient Therapy:    Alcohol Screening: 1. How often do you have a drink containing alcohol?: Never 2. How many drinks containing alcohol do you have on a typical day when you are drinking?: 1 or 2 3. How often do you have six or more drinks on one occasion?: Never AUDIT-C Score: 0 Substance Abuse History in the last 12 months:  Yes.   Consequences of Substance Abuse: Negative Previous Psychotropic Medications: Yes  Psychological Evaluations: Yes  Past Medical History:  Past Medical History:  Diagnosis Date  . Hernia, inguinal, right   .  Inguinal hernia    right  . Psychiatric illness   . Schizophrenia ([place])    History reviewed. No pertinent surgical history. Family History:  Family  History  Problem Relation Age of Onset  . Psychiatric Illness Mother   . Hypertension Sister    Family Psychiatric  History: Mother with unspecified psychiatric illness Tobacco Screening:   Social History:  Social History   Substance and Sexual Activity  Alcohol Use Yes     Social History   Substance and Sexual Activity  Drug Use Yes  . Types: Marijuana    Additional Social History:                           Allergies:  No Known Allergies Lab Results:  Results for orders placed or performed during the hospital encounter of 04/22/21 (from the past 48 hour(s))  Comprehensive metabolic panel     Status: None   Collection Time: 04/22/21  9:16 PM  Result Value Ref Range   Sodium 138 135 - 145 mmol/L   Potassium 3.8 3.5 - 5.1 mmol/L   Chloride 105 98 - 111 mmol/L   CO2 27 22 - 32 mmol/L   Glucose, Bld 87 70 - 99 mg/dL    Comment: Glucose reference range applies only to samples taken after fasting for at least 8 hours.   BUN 13 6 - 20 mg/dL   Creatinine, Ser 1.07 0.61 - 1.24 mg/dL   Calcium 9.4 8.9 - 10.3 mg/dL   Total Protein 6.8 6.5 - 8.1 g/dL   Albumin 4.3 3.5 - 5.0 g/dL   AST 27 15 - 41 U/L   ALT 17 0 - 44 U/L   Alkaline Phosphatase 54 38 - 126 U/L   Total Bilirubin 0.6 0.3 - 1.2 mg/dL   GFR, Estimated >60 >60 mL/min    Comment: (NOTE) Calculated using the CKD-EPI Creatinine Equation (2021)    Anion gap 6 5 - 15    Comment: Performed at [facility] [address] [place], [place] [address]  Ethanol     Status: None   Collection Time: 04/22/21  9:16 PM  Result Value Ref Range   Alcohol, Ethyl (B) <10 <10 mg/dL    Comment: (NOTE) Lowest detectable limit for serum alcohol is 10 mg/dL.  For medical purposes only. Performed at [facility], [place] [address] [place], [place] [address]   CBC with Diff     Status: None   Collection Time: 04/22/21  9:16 PM  Result Value Ref Range   WBC 8.5 4.0 - 10.5 K/uL   RBC 5.06 4.22 - 5.81 MIL/uL    Hemoglobin 14.3 13.0 - 17.0 g/dL   HCT 43.2 39.0 - 52.0 %   MCV 85.4 80.0 - 100.0 fL   MCH 28.3 26.0 - 34.0 pg   MCHC 33.1 30.0 - 36.0 g/dL   RDW 13.7 11.5 - 15.5 %   Platelets 331 150 - 400 K/uL   nRBC 0.0 0.0 - 0.2 %   Neutrophils Relative % 47 %   Neutro Abs 4.0 1.7 - 7.7 K/uL   Lymphocytes Relative 40 %   Lymphs Abs 3.4 0.7 - 4.0 K/uL   Monocytes Relative 8 %   Monocytes Absolute 0.7 0.1 - 1.0 K/uL   Eosinophils Relative 4 %   Eosinophils Absolute 0.4 0.0 - 0.5 K/uL   Basophils Relative 1 %   Basophils Absolute 0.1 0.0 - 0.1 K/uL  Immature Granulocytes 0 %   Abs Immature Granulocytes 0.02 0.00 - 0.07 K/uL    Comment: Performed at [facility], [place] [address], [place], [place] [address]  Salicylate level     Status: Abnormal   Collection Time: 04/22/21  9:16 PM  Result Value Ref Range   Salicylate Lvl <8.6 (L) 7.0 - 30.0 mg/dL    Comment: Performed at [facility] [address], [place], [place] [address]  Acetaminophen level     Status: Abnormal   Collection Time: 04/22/21  9:16 PM  Result Value Ref Range   Acetaminophen (Tylenol), Serum <10 (L) 10 - 30 ug/mL    Comment: (NOTE) Therapeutic concentrations vary significantly. A range of 10-30 ug/mL  may be an effective concentration for many patients. However, some  are best treated at concentrations outside of this range. Acetaminophen concentrations >150 ug/mL at 4 hours after ingestion  and >50 ug/mL at 12 hours after ingestion are often associated with  toxic reactions.  Performed at [facility], [place] [address] [place], [place] [address]   Urine rapid drug screen (hosp performed)     Status: Abnormal   Collection Time: 04/23/21  1:42 AM  Result Value Ref Range   Opiates NONE DETECTED NONE DETECTED   Cocaine POSITIVE (A) NONE DETECTED   Benzodiazepines NONE DETECTED NONE DETECTED   Amphetamines NONE DETECTED NONE DETECTED   Tetrahydrocannabinol POSITIVE (A) NONE DETECTED   Barbiturates NONE  DETECTED NONE DETECTED    Comment: (NOTE) DRUG SCREEN FOR MEDICAL PURPOSES ONLY.  IF CONFIRMATION IS NEEDED FOR ANY PURPOSE, NOTIFY LAB WITHIN 5 DAYS.  LOWEST DETECTABLE LIMITS FOR URINE DRUG SCREEN Drug Class                     Cutoff (ng/mL) Amphetamine and metabolites    1000 Barbiturate and metabolites    200 Benzodiazepine                 712 Tricyclics and metabolites     300 Opiates and metabolites        300 Cocaine and metabolites        300 THC                            50 Performed at [facility], [place] [address], [place],  [address]   Resp Panel by RT-PCR (Flu A&B, Covid) Nasopharyngeal Swab     Status: None   Collection Time: 04/23/21  2:04 AM   Specimen: Nasopharyngeal Swab; Nasopharyngeal(NP) swabs in vial transport medium  Result Value Ref Range   SARS Coronavirus 2 by RT PCR NEGATIVE NEGATIVE    Comment: (NOTE) SARS-CoV-2 target nucleic acids are NOT DETECTED.  The SARS-CoV-2 RNA is generally detectable in upper respiratory specimens during the acute phase of infection. The lowest concentration of SARS-CoV-2 viral copies this assay can detect is 138 copies/mL. A negative result does not preclude SARS-Cov-2 infection and should not be used as the sole basis for treatment or other patient management decisions. A negative result may occur with  improper specimen collection/handling, submission of specimen other than nasopharyngeal swab, presence of viral mutation(s) within the areas targeted by this assay, and inadequate number of viral copies(<138 copies/mL). A negative result must be combined with clinical observations, patient history, and epidemiological information. The expected result is Negative.  Fact Sheet for Patients:  EntrepreneurPulse.com.au  Fact Sheet for  Healthcare Providers:  IncredibleEmployment.be  This test is no t yet approved or cleared by the [place] and  has been  authorized for detection and/or diagnosis of SARS-CoV-2 by FDA under an Emergency Use Authorization (EUA). This EUA will remain  in effect (meaning this test can be used) for the duration of the COVID-19 declaration under Section 564(b)(1) of the Act, 21 U.S.C.section 360bbb-3(b)(1), unless the authorization is terminated  or revoked sooner.       Influenza A by PCR NEGATIVE NEGATIVE   Influenza B by PCR NEGATIVE NEGATIVE    Comment: (NOTE) The Xpert Xpress SARS-CoV-2/FLU/RSV plus assay is intended as an aid in the diagnosis of influenza from Nasopharyngeal swab specimens and should not be used as a sole basis for treatment. Nasal washings and aspirates are unacceptable for Xpert Xpress SARS-CoV-2/FLU/RSV testing.  Fact Sheet for Patients: EntrepreneurPulse.com.au  Fact Sheet for Healthcare Providers: IncredibleEmployment.be  This test is not yet approved or cleared by the [place] FDA and has been authorized for detection and/or diagnosis of SARS-CoV-2 by FDA under an Emergency Use Authorization (EUA). This EUA will remain in effect (meaning this test can be used) for the duration of the COVID-19 declaration under Section 564(b)(1) of the Act, 21 U.S.C. section 360bbb-3(b)(1), unless the authorization is terminated or revoked.  Performed at [facility], [place] [address], [place], [place] [address]     Blood Alcohol level:  Lab Results  Component Value Date   [facility] <10 04/22/2021   ETH <10 [id number]    Metabolic Disorder Labs:  Lab Results  Component Value Date   HGBA1C 5.1 11/02/2020   MPG 99.67 11/02/2020   MPG 85.32 05/30/2018   Lab Results  Component Value Date   PROLACTIN 32.5 (H) 05/30/2018   Lab Results  Component Value Date   CHOL 148 11/02/2020   TRIG 47 11/02/2020   HDL 71 11/02/2020   CHOLHDL 2.1 11/02/2020   VLDL 9 11/02/2020   LDLCALC 68 11/02/2020   LDLCALC 107 (H) 05/30/2018    Current  Medications: Current Facility-Administered Medications  Medication Dose Route Frequency Provider Last Rate Last Admin  . acetaminophen (TYLENOL) tablet 650 mg  650 mg Oral Q6H PRN Sharma Covert, MD      . alum & mag hydroxide-simeth (MAALOX/MYLANTA) 200-200-20 MG/5ML suspension 30 mL  30 mL Oral Q4H PRN Sharma Covert, MD      . benztropine (COGENTIN) tablet 0.5 mg  0.5 mg Oral BID PRN Sharma Covert, MD      . gabapentin (NEURONTIN) capsule 300 mg  300 mg Oral TID Sharma Covert, MD   300 mg at 04/24/21 1044  . hydrOXYzine (ATARAX/VISTARIL) tablet 25 mg  25 mg Oral TID PRN Sharma Covert, MD      . risperiDONE (RISPERDAL M-TABS) disintegrating tablet 2 mg  2 mg Oral Q8H PRN Sharma Covert, MD       And  . LORazepam (ATIVAN) tablet 1 mg  1 mg Oral Q6H PRN Sharma Covert, MD       And  . ziprasidone (GEODON) injection 20 mg  20 mg Intramuscular Q6H PRN Sharma Covert, MD      . magnesium hydroxide (MILK OF MAGNESIA) suspension 30 mL  30 mL Oral Daily PRN Sharma Covert, MD      . risperiDONE (RISPERDAL M-TABS) disintegrating tablet 1 mg  1 mg Oral Daily Sharma Covert, MD   1 mg at 04/24/21 1045  .  risperiDONE (RISPERDAL M-TABS) disintegrating tablet 2 mg  2 mg Oral QHS Sharma Covert, MD   2 mg at 04/23/21 2043  . traZODone (DESYREL) tablet 50 mg  50 mg Oral QHS PRN Sharma Covert, MD       PTA Medications: Medications Prior to Admission  Medication Sig Dispense Refill Last Dose  . risperiDONE (RISPERDAL M-TABS) 2 MG disintegrating tablet Take 1 tablet (2 mg total) by mouth daily. (Patient not taking: Reported on 04/12/2021) 30 tablet 0     Musculoskeletal: Strength & Muscle Tone: within normal limits Gait & Station: normal Patient leans: N/A            Psychiatric Specialty Exam:  Presentation  General Appearance: Disheveled  Eye Contact:Fair  Speech:Normal Rate  Speech Volume:Decreased  Handedness:Right   Mood and  Affect  Mood:Dysphoric  Affect:Flat   Thought Process  Thought Processes:Goal Directed  Duration of Psychotic Symptoms: Greater than six months  Past Diagnosis of Schizophrenia or Psychoactive disorder: Yes  Descriptions of Associations:Loose  Orientation:Full (Time, Place and Person)  Thought Content:Delusions; Paranoid Ideation  Hallucinations:Hallucinations: Auditory  Ideas of Reference:Delusions; Paranoia  Suicidal Thoughts:Suicidal Thoughts: No  Homicidal Thoughts:Homicidal Thoughts: No   Sensorium  Memory:Immediate Poor; Recent Poor; Remote Poor  Judgment:Impaired  Insight:Fair   Executive Functions  Concentration:Fair  Attention Span:Fair  [place]   Psychomotor Activity  Psychomotor Activity:Psychomotor Activity: Decreased   Assets  Assets:Desire for Improvement; Resilience   Sleep  Sleep:Sleep: Good Number of Hours of Sleep: 7.5    Physical Exam: Physical Exam Vitals and nursing note reviewed.  Constitutional:      Appearance: Normal appearance.  HENT:     Head: Normocephalic and atraumatic.  Pulmonary:     Effort: Pulmonary effort is normal.  Neurological:     General: No focal deficit present.     Mental Status: He is alert and oriented to person, place, and time.    ROS Blood pressure 113/75, pulse 72, temperature 98 F (36.7 C), temperature source Oral, resp. rate 16, height 5' 8.11" (1.73 m), weight 67.1 kg, SpO2 99 %. Body mass index is 22.43 kg/m.  Treatment Plan Summary: Daily contact with patient to assess and evaluate symptoms and progress in treatment, Medication management and Plan : Patient is seen and examined.  Patient is a 25 year old male with the above-stated past psychiatric history who was admitted with noncompliance of medications, substance abuse, suicidal ideation with secondary gain.  He will be admitted to the hospital.  He will be integrated in the milieu.  He  will be encouraged to attend groups.  We will continue the Risperdal 1 mg p.o. daily and 2 mg p.o. nightly, and that will be titrated during the course hospitalization.  He also be placed on the Risperdal agitation protocol.  He will begin Cogentin 0.5 mg p.o. twice daily as needed tremors.  Review of the electronic medical record results shows normal electrolytes including liver function enzymes and creatinine.  CBC was normal.  Differential was normal.  Acetaminophen was less than 10, salicylate less than 7.  Respiratory panel was negative for influenza A, B and coronavirus.  Blood alcohol was less than 10.  Drug screen was positive for cocaine and marijuana.  His vital signs are stable, he is afebrile.  We will attempt to giving the long-acting injectable antipsychotic medication prior to discharge.  Observation Level/Precautions:  15 minute checks  Laboratory:  Chemistry Profile  Psychotherapy:    Medications:  Consultations:    Discharge Concerns:    Estimated LOS:  Other:     Physician Treatment Plan for Primary Diagnosis: <principal problem not specified> Long Term Goal(s): Improvement in symptoms so as ready for discharge  Short Term Goals: Ability to identify changes in lifestyle to reduce recurrence of condition will improve, Ability to verbalize feelings will improve, Ability to disclose and discuss suicidal ideas, Ability to demonstrate self-control will improve, Ability to identify and develop effective coping behaviors will improve, Ability to maintain clinical measurements within normal limits will improve, Compliance with prescribed medications will improve and Ability to identify triggers associated with substance abuse/mental health issues will improve  Physician Treatment Plan for Secondary Diagnosis: Active Problems:   Substance-induced psychotic disorder ([place])  Long Term Goal(s): Improvement in symptoms so as ready for discharge  Short Term Goals: Ability to identify  changes in lifestyle to reduce recurrence of condition will improve, Ability to verbalize feelings will improve, Ability to disclose and discuss suicidal ideas, Ability to demonstrate self-control will improve, Ability to identify and develop effective coping behaviors will improve, Ability to maintain clinical measurements within normal limits will improve, Compliance with prescribed medications will improve and Ability to identify triggers associated with substance abuse/mental health issues will improve  I certify that inpatient services furnished can reasonably be expected to improve the patient's condition.    Sharma Covert, MD 4/26/20221:08 PM
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
## 2021-04-24 NOTE — BHH Suicide Risk Assessment (Signed)
[place] INPATIENT:  Family/Significant Other Suicide Prevention Education    Refusal of Consents:  Suicide Prevention Education:  Patient Refusal for Family/Significant Other Suicide Prevention Education: The patient Tyler White has refused to provide written consent for family/significant other to be provided Family/Significant Other Suicide Prevention Education during admission and/or prior to discharge.  Physician notified.   SPE completed with patient, as patient refused to consent to family contact. SPI pamphlet provided to pt and pt was encouraged to share information with support network, ask questions, and talk about any concerns relating to SPE. Patient denies access to guns/firearms and verbalized understanding of information provided. Mobile Crisis information also provided to patient.   Darletta Moll MSW, LCSW Clincal Social Worker  [facility]

## 2021-04-24 NOTE — Progress Notes (Signed)
   04/24/21 0630  Vital Signs  Temp 98 F (36.7 C)  Temp Source Oral  Pulse Rate 73  Pulse Rate Source Monitor  BP 109/74  BP Location Left Arm  BP Method Automatic  Patient Position (if appropriate) Sitting  Oxygen Therapy  SpO2 99 %   D: Patient denies SI/HI/AVH. Pt. Denies both anxiety and depression. Patient isolated in his room and initially refused medications but took them later when they were brought to him.  A:  Patient took scheduled medicine.  Support and encouragement provided Routine safety checks conducted every 15 minutes. Patient  Informed to notify staff with any concerns.   R:  Safety maintained.
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
## 2021-04-24 NOTE — Progress Notes (Signed)
Recreation Therapy Notes  Date: 4.26.22 Time: 1000 Location: 500 Hall Dayroom  Group Topic: Wellness  Goal Area(s) Addresses:  Patient will define components of whole wellness. Patient will verbalize benefit of whole wellness.  Intervention: Exercise  Activity:  Patients participated in a range of exercises.  Each patient had the opportunity to pick stretches and exercises of their choosing to lead the group in.  The goal was to get at least 30 minutes of active movement to loosen the muscles, clear the mind and let go of any frustrations.  After exercising patients were given two worksheets.  One worksheet gave examples of some things patients can do to help with total wellbeing such as eating right, taking a walk, getting proper sleep, etc.  The other sheet taught patients about the different types of exercise (anaerobic and aerobic).  It also left space for patients to plan out when they can exercise and the types of exercise they want to do.    Education:Wellness, Discharge Planning.   Education Outcome: Acknowledges education/In group clarification offered/Needs additional education.   Clinical Observations/Feedback:  Pt did not attend group session.    Victorino Sparrow, LRT/CTRS         Ria Comment, Zaydrian Batta A 04/24/2021 11:22 AM

## 2021-04-24 NOTE — Progress Notes (Addendum)
   04/24/21 2030  Psych Admission Type (Psych Patients Only)  Admission Status Voluntary  Psychosocial Assessment  Patient Complaints None  Eye Contact Avoids  Facial Expression Flat;Pensive;Sullen;Sad  Affect Anxious;Blunted;Depressed;Preoccupied;Sad  Speech Slow;Soft;Elective mutism  Interaction Evasive;Guarded  Motor Activity Slow  Appearance/Hygiene Unremarkable  Behavior Characteristics Cooperative;Guarded  Mood Depressed  Thought Process  Coherency Blocking  Content Preoccupation  Delusions None reported or observed  Perception WDL  Hallucination None reported or observed  Judgment Poor  Confusion UTA  Danger to Self  Current suicidal ideation? Denies  Danger to Others  Danger to Others None reported or observed   Pt denied SI, HI, AVH and pain. Pt still electively mute, shaking his head in response to questions asked. Pt takes medications as prescribed.

## 2021-04-25 ENCOUNTER — Other Ambulatory Visit (HOSPITAL_COMMUNITY): Payer: Self-pay

## 2021-04-25 MED ORDER — PALIPERIDONE PALMITATE ER 234 MG/1.5ML IM SUSY
PREFILLED_SYRINGE | INTRAMUSCULAR | 0 refills | Status: DC
  Filled 2021-04-25: qty 1.5, 30d supply, fill #0

## 2021-04-25 MED ORDER — INVEGA SUSTENNA 156 MG/ML IM SUSY
PREFILLED_SYRINGE | INTRAMUSCULAR | 0 refills | Status: DC
  Filled 2021-04-25: qty 1, 30d supply, fill #0

## 2021-04-25 MED ORDER — PALIPERIDONE PALMITATE ER 234 MG/1.5ML IM SUSY
234.0000 mg | PREFILLED_SYRINGE | Freq: Once | INTRAMUSCULAR | Status: DC
  Filled 2021-04-25 (×2): qty 1.5

## 2021-04-25 MED ORDER — VITAMIN D (ERGOCALCIFEROL) 1.25 MG (50000 UNIT) PO CAPS
50000.0000 [IU] | ORAL_CAPSULE | ORAL | Status: DC
  Administered 2021-04-26: 50000 [IU] via ORAL
  Filled 2021-04-25 (×2): qty 1

## 2021-04-25 MED ORDER — PALIPERIDONE PALMITATE ER 234 MG/1.5ML IM SUSY
234.0000 mg | PREFILLED_SYRINGE | Freq: Once | INTRAMUSCULAR | Status: AC
  Administered 2021-04-25: 234 mg via INTRAMUSCULAR

## 2021-04-25 MED ORDER — GABAPENTIN 400 MG PO CAPS
400.0000 mg | ORAL_CAPSULE | Freq: Three times a day (TID) | ORAL | Status: DC
  Administered 2021-04-25 – 2021-04-30 (×15): 400 mg via ORAL
  Filled 2021-04-25 (×3): qty 1
  Filled 2021-04-25 (×2): qty 21
  Filled 2021-04-25 (×8): qty 1
  Filled 2021-04-25 (×2): qty 21
  Filled 2021-04-25 (×2): qty 1
  Filled 2021-04-25: qty 21
  Filled 2021-04-25 (×2): qty 1
  Filled 2021-04-25: qty 21
  Filled 2021-04-25 (×4): qty 1

## 2021-04-25 NOTE — Progress Notes (Signed)
[facility] MD Progress Note  04/26/2021 7:22 AM Tyler White  MRN:  [id number]   Chief Complaint: SI, AVH  Subjective:  Tyler White is a 25 y.o. male with a history of schizoaffective d/o vs schizophrenia, cannabis use d/o, and stimulant use d/o -cocaine type, who was initially admitted for inpatient psychiatric hospitalization on 04/23/2021 for management of AVH and SI in the context of medication noncompliance. The patient is currently on Hospital Day 3.   Chart Review from last 24 hours:  The patient's chart was reviewed and nursing notes were reviewed. The patient's case was discussed in multidisciplinary team meeting. Per nursing, patient was isolative to his room and guarded. No behavioral issues noted. Per [facility] he was compliant with scheduled medications and did receive Vistaril X1 for anxiety.   Information Obtained Today During Patient Interview: The patient was seen and evaluated on the unit. On assessment today the patient reports that his mood is "alright" and he reports his appetite and sleep are "okay." He voices no physical complaints and he denies SI or HI. He denies AVH, first rank symptoms or paranoia but he appears paranoid and guarded on exam. He denies medication side-effects. He was encouraged to get out of bed and attend to ADLs.   Principal Problem: Schizoaffective disorder, bipolar type ([place]) Diagnosis: Principal Problem:   Schizoaffective disorder, bipolar type ([place]) Active Problems:   Cocaine abuse ([place])   Marijuana abuse  Total Time Spent in Direct Patient Care:  I personally spent 35 minutes on the unit in direct patient care. The direct patient care time included face-to-face time with the patient, reviewing the patient's chart, communicating with other professionals, and coordinating care. Greater than 50% of this time was spent in counseling or coordinating care with the patient regarding goals of hospitalization, psycho-education, and discharge planning  needs.  Past Psychiatric History: see admission H&P  Past Medical History:  Past Medical History:  Diagnosis Date  . Hernia, inguinal, right   . Inguinal hernia    right  . Psychiatric illness   . Schizophrenia ([place])     Family History:  Family History  Problem Relation Age of Onset  . Psychiatric Illness Mother   . Hypertension Sister    Family Psychiatric  History: see admission H&P  Social History:  Social History   Substance and Sexual Activity  Alcohol Use Yes     Social History   Substance and Sexual Activity  Drug Use Yes  . Types: Marijuana    Social History   Socioeconomic History  . Marital status: Single    Spouse name: Not on file  . Number of children: Not on file  . Years of education: 42  . Highest education level: Not on file  Occupational History  . Not on file  Tobacco Use  . Smoking status: Current Every Day Smoker    Packs/day: 0.50    Years: 5.00    Pack years: 2.50    Types: Cigarettes  . Smokeless tobacco: Never Used  Vaping Use  . Vaping Use: Never used  Substance and Sexual Activity  . Alcohol use: Yes  . Drug use: Yes    Types: Marijuana  . Sexual activity: Yes    Birth control/protection: None  Other Topics Concern  . Not on file  Social History Narrative   ** Merged History Encounter **       ** Merged History Encounter **       Social Determinants of Radio broadcast assistant  Strain: Not on file  Food Insecurity: Not on file  Transportation Needs: Not on file  Physical Activity: Not on file  Stress: Not on file  Social Connections: Not on file   Sleep: Good  Appetite:  Fair  Current Medications: Current Facility-Administered Medications  Medication Dose Route Frequency Provider Last Rate Last Admin  . acetaminophen (TYLENOL) tablet 650 mg  650 mg Oral Q6H PRN Sharma Covert, MD      . alum & mag hydroxide-simeth (MAALOX/MYLANTA) 200-200-20 MG/5ML suspension 30 mL  30 mL Oral Q4H PRN Sharma Covert, MD      . benztropine (COGENTIN) tablet 0.5 mg  0.5 mg Oral BID PRN Sharma Covert, MD      . gabapentin (NEURONTIN) capsule 400 mg  400 mg Oral TID Sharma Covert, MD   400 mg at 04/25/21 1704  . hydrOXYzine (ATARAX/VISTARIL) tablet 25 mg  25 mg Oral TID PRN Sharma Covert, MD   25 mg at 04/25/21 2200  . risperiDONE (RISPERDAL M-TABS) disintegrating tablet 2 mg  2 mg Oral Q8H PRN Sharma Covert, MD       And  . LORazepam (ATIVAN) tablet 1 mg  1 mg Oral Q6H PRN Sharma Covert, MD       And  . ziprasidone (GEODON) injection 20 mg  20 mg Intramuscular Q6H PRN Sharma Covert, MD      . magnesium hydroxide (MILK OF MAGNESIA) suspension 30 mL  30 mL Oral Daily PRN Sharma Covert, MD      . risperiDONE (RISPERDAL M-TABS) disintegrating tablet 1 mg  1 mg Oral Daily Sharma Covert, MD   1 mg at 04/25/21 7322  . risperiDONE (RISPERDAL M-TABS) disintegrating tablet 2 mg  2 mg Oral QHS Sharma Covert, MD   2 mg at 04/25/21 2158  . traZODone (DESYREL) tablet 50 mg  50 mg Oral QHS PRN Sharma Covert, MD   50 mg at 04/24/21 2159  . Vitamin D (Ergocalciferol) (DRISDOL) capsule 50,000 Units  50,000 Units Oral Q7 days Harlow Asa, MD        Lab Results: No results found for this or any previous visit (from the past 48 hour(s)).  Blood Alcohol level:  Lab Results  Component Value Date   ETH <10 04/22/2021   ETH <10 02/54/2706    Metabolic Disorder Labs: Lab Results  Component Value Date   HGBA1C 5.1 11/02/2020   MPG 99.67 11/02/2020   MPG 85.32 05/30/2018   Lab Results  Component Value Date   PROLACTIN 32.5 (H) 05/30/2018   Lab Results  Component Value Date   CHOL 148 11/02/2020   TRIG 47 11/02/2020   HDL 71 11/02/2020   CHOLHDL 2.1 11/02/2020   VLDL 9 11/02/2020   LDLCALC 68 11/02/2020   LDLCALC 107 (H) 05/30/2018   Musculoskeletal: Strength & Muscle Tone: within normal limits Gait & Station: normal, steady Patient leans:  N/A  Psychiatric Specialty Exam: Physical Exam Vitals reviewed.  HENT:     Head: Normocephalic.  Pulmonary:     Effort: Pulmonary effort is normal.  Neurological:     Mental Status: He is alert.     Review of Systems  Respiratory: Negative for shortness of breath.   Cardiovascular: Negative for chest pain.  Gastrointestinal: Negative for diarrhea, nausea and vomiting.    Blood pressure 125/82, pulse 82, temperature 97.8 F (36.6 C), temperature source Oral, resp. rate 18, height 5' 8.11" (1.73 m),  weight 67.1 kg, SpO2 100 %.Body mass index is 22.43 kg/m.  General Appearance: casually dressed, fair hygiene, appears stated age  Eye Contact:  Fair  Speech:  minimal and mumbling quality, occasional speech latency  Volume:  Decreased  Mood:  aloof  Affect:  Constricted and guarded  Thought Process:  Goal Directed  Orientation:  Oriented to self, year, month and city  Thought Content:  Denies AVH, paranoia, ideas of reference, or first rank symptoms but appears paranoid and guarded on exam with some occasional speech latency   Suicidal Thoughts:  No  Homicidal Thoughts:  No  Memory:  Recent;   Fair  Judgement:  Fair  Insight:  Lacking  Psychomotor Activity:  Normal; no cogwheeling, no stiffness, no tremor  Concentration:  Concentration: Fair  Recall:  [employer] of Knowledge:  Fair  Language:  Fair  Akathisia:  Negative  AIMS (if indicated):   0  Assets:  Communication Skills Desire for Improvement Resilience  ADL's:  Intact  Cognition:  WNL  Sleep:  Number of Hours: 6.25   Treatment Plan Summary: Diagnoses / Active Problems: Schizoaffective d/o bipolar type by hx (r/o schizophrenia) Cannabis use d/o Stimulant use d/o - cocaine type Vitamin D deficiency  PLAN: 1. Safety and Monitoring:  -- Voluntary admission to inpatient psychiatric unit for safety, stabilization and treatment  -- Daily contact with patient to assess and evaluate symptoms and progress in  treatment  -- Patient's case to be discussed in multi-disciplinary team meeting  -- Observation Level : q15 minute checks  -- Vital signs:  q12 hours  -- Precautions: suicide  2. Psychiatric Diagnoses and Treatment:  Schizoaffective d/o by hx (r/o schizophrenia) -- Continue Cogentin 0.5mg  bid PRN EPS/tremors -- Continue Risperdal M-tabs 1mg  po daily and 2mg  po qhs for psychosis while LAI reaches steady state -- Received Invega sustenna 234mg  IM on 04/25/21 and will get 2nd loading injection on 04/29/21 -- Continue Neurontin 400mg  tid for anxiety and mood stability -- Continue Trazodone 50mg  po qhs PRN Insomnia  -- Continue Vistaril 25mg  tid PRN anxiety -- Continue Risperdal agitation protocol PRN  -- Metabolic profile and EKG monitoring obtained while on an atypical antipsychotic (BMI:22.43; Lipid Panel: cholesterol 181, triglycerides 90, HDL 54, LDL 109;HbgA1c:5.2 QTc:380ms)   -- Encouraged patient to participate in unit milieu and in scheduled group therapies   -- Short Term Goals: Ability to demonstrate self-control will improve and Ability to identify and develop effective coping behaviors will improve  -- Long Term Goals: Improvement in symptoms so as ready for discharge   Cannabis use d/o  Stimulant use d/o - cocaine type  -- UDS positive for THC and cocaine  -- Patient counseled on the need to abstain from illicit substances after discharge  -- Short Term Goals: Ability to identify triggers associated with substance abuse/mental health issues will improve  -- Long Term Goals: Improvement in symptoms so as ready for discharge   3. Medical Issues Being Addressed:   Vitamin D deficiency   -- Start Vitamin D 50,000IU weekly  4. Discharge Planning:   -- Social work and case management to assist with discharge planning and identification of hospital follow-up needs prior to discharge  -- Estimated LOS: 4-5 days  -- Discharge Concerns: Need to establish a safety plan; Medication  compliance and effectiveness  -- Discharge Goals: Return home with outpatient referrals for mental health follow-up including medication management/psychotherapy  Harlow Asa, MD, FAPA 04/26/2021, 7:22 AM

## 2021-04-25 NOTE — Tx Team (Signed)
Interdisciplinary Treatment and Diagnostic Plan Update  04/25/2021 Time of Session: 9:40am Tyler White MRN: [id number]  Principal Diagnosis: <principal problem not specified>  Secondary Diagnoses: Active Problems:   Substance-induced psychotic disorder ([place])   Current Medications:  Current Facility-Administered Medications  Medication Dose Route Frequency Provider Last Rate Last Admin  . acetaminophen (TYLENOL) tablet 650 mg  650 mg Oral Q6H PRN Sharma Covert, MD      . alum & mag hydroxide-simeth (MAALOX/MYLANTA) 200-200-20 MG/5ML suspension 30 mL  30 mL Oral Q4H PRN Sharma Covert, MD      . benztropine (COGENTIN) tablet 0.5 mg  0.5 mg Oral BID PRN Sharma Covert, MD      . gabapentin (NEURONTIN) capsule 400 mg  400 mg Oral TID Sharma Covert, MD   400 mg at 04/25/21 1148  . hydrOXYzine (ATARAX/VISTARIL) tablet 25 mg  25 mg Oral TID PRN Sharma Covert, MD   25 mg at 04/24/21 2159  . risperiDONE (RISPERDAL M-TABS) disintegrating tablet 2 mg  2 mg Oral Q8H PRN Sharma Covert, MD       And  . LORazepam (ATIVAN) tablet 1 mg  1 mg Oral Q6H PRN Sharma Covert, MD       And  . ziprasidone (GEODON) injection 20 mg  20 mg Intramuscular Q6H PRN Sharma Covert, MD      . magnesium hydroxide (MILK OF MAGNESIA) suspension 30 mL  30 mL Oral Daily PRN Sharma Covert, MD      . paliperidone (INVEGA SUSTENNA) injection 234 mg  234 mg Intramuscular Once Sharma Covert, MD      . risperiDONE (RISPERDAL M-TABS) disintegrating tablet 1 mg  1 mg Oral Daily Sharma Covert, MD   1 mg at 04/25/21 0962  . risperiDONE (RISPERDAL M-TABS) disintegrating tablet 2 mg  2 mg Oral QHS Sharma Covert, MD   2 mg at 04/24/21 2019  . traZODone (DESYREL) tablet 50 mg  50 mg Oral QHS PRN Sharma Covert, MD   50 mg at 04/24/21 2159   PTA Medications: Medications Prior to Admission  Medication Sig Dispense Refill Last Dose  . risperiDONE (RISPERDAL M-TABS) 2 MG  disintegrating tablet Take 1 tablet (2 mg total) by mouth daily. (Patient not taking: Reported on 04/12/2021) 30 tablet 0     Patient Stressors: Financial difficulties Other: lack of housing  Patient Strengths: Ability for insight Communication skills Physical Health  Treatment Modalities: Medication Management, Group therapy, Case management,  1 to 1 session with clinician, Psychoeducation, Recreational therapy.   Physician Treatment Plan for Primary Diagnosis: <principal problem not specified> Long Term Goal(s): Improvement in symptoms so as ready for discharge Improvement in symptoms so as ready for discharge   Short Term Goals: Ability to identify changes in lifestyle to reduce recurrence of condition will improve Ability to verbalize feelings will improve Ability to disclose and discuss suicidal ideas Ability to demonstrate self-control will improve Ability to identify and develop effective coping behaviors will improve Ability to maintain clinical measurements within normal limits will improve Compliance with prescribed medications will improve Ability to identify triggers associated with substance abuse/mental health issues will improve Ability to identify changes in lifestyle to reduce recurrence of condition will improve Ability to verbalize feelings will improve Ability to disclose and discuss suicidal ideas Ability to demonstrate self-control will improve Ability to identify and develop effective coping behaviors will improve Ability to maintain clinical measurements within normal limits will improve Compliance  with prescribed medications will improve Ability to identify triggers associated with substance abuse/mental health issues will improve  Medication Management: Evaluate patient's response, side effects, and tolerance of medication regimen.  Therapeutic Interventions: 1 to 1 sessions, Unit Group sessions and Medication administration.  Evaluation of Outcomes:  Progressing  Physician Treatment Plan for Secondary Diagnosis: Active Problems:   Substance-induced psychotic disorder ([place])  Long Term Goal(s): Improvement in symptoms so as ready for discharge Improvement in symptoms so as ready for discharge   Short Term Goals: Ability to identify changes in lifestyle to reduce recurrence of condition will improve Ability to verbalize feelings will improve Ability to disclose and discuss suicidal ideas Ability to demonstrate self-control will improve Ability to identify and develop effective coping behaviors will improve Ability to maintain clinical measurements within normal limits will improve Compliance with prescribed medications will improve Ability to identify triggers associated with substance abuse/mental health issues will improve Ability to identify changes in lifestyle to reduce recurrence of condition will improve Ability to verbalize feelings will improve Ability to disclose and discuss suicidal ideas Ability to demonstrate self-control will improve Ability to identify and develop effective coping behaviors will improve Ability to maintain clinical measurements within normal limits will improve Compliance with prescribed medications will improve Ability to identify triggers associated with substance abuse/mental health issues will improve     Medication Management: Evaluate patient's response, side effects, and tolerance of medication regimen.  Therapeutic Interventions: 1 to 1 sessions, Unit Group sessions and Medication administration.  Evaluation of Outcomes: Progressing   RN Treatment Plan for Primary Diagnosis: <principal problem not specified> Long Term Goal(s): Knowledge of disease and therapeutic regimen to maintain health will improve  Short Term Goals: Ability to remain free from injury will improve, Ability to participate in decision making will improve, Ability to verbalize feelings will improve, Ability to disclose and  discuss suicidal ideas and Ability to identify and develop effective coping behaviors will improve  Medication Management: RN will administer medications as ordered by provider, will assess and evaluate patient's response and provide education to patient for prescribed medication. RN will report any adverse and/or side effects to prescribing provider.  Therapeutic Interventions: 1 on 1 counseling sessions, Psychoeducation, Medication administration, Evaluate responses to treatment, Monitor vital signs and CBGs as ordered, Perform/monitor CIWA, COWS, AIMS and Fall Risk screenings as ordered, Perform wound care treatments as ordered.  Evaluation of Outcomes: Progressing   LCSW Treatment Plan for Primary Diagnosis: <principal problem not specified> Long Term Goal(s): Safe transition to appropriate next level of care at discharge, Engage patient in therapeutic group addressing interpersonal concerns.  Short Term Goals: Engage patient in aftercare planning with referrals and resources, Increase social support, Increase emotional regulation, Facilitate acceptance of mental health diagnosis and concerns, Identify triggers associated with mental health/substance abuse issues and Increase skills for wellness and recovery  Therapeutic Interventions: Assess for all discharge needs, 1 to 1 time with Social worker, Explore available resources and support systems, Assess for adequacy in community support network, Educate family and significant other(s) on suicide prevention, Complete Psychosocial Assessment, Interpersonal group therapy.  Evaluation of Outcomes: Progressing   Progress in Treatment: Attending groups: No. Participating in groups: No. Taking medication as prescribed: Yes. Toleration medication: Yes. Family/Significant other contact made: No, will contact:  [place]  Patient understands diagnosis: No. Discussing patient identified problems/goals with staff: Yes. Medical problems  stabilized or resolved: Yes. Denies suicidal/homicidal ideation: Yes. Issues/concerns per patient self-inventory: No.   New problem(s) identified: No, Describe:  None   New Short Term/Long Term Goal(s): medication stabilization, elimination of SI thoughts, development of comprehensive mental wellness plan.   Patient Goals: "To get back on my medications"  Discharge Plan or Barriers: Patient recently admitted. CSW will continue to follow and assess for appropriate referrals and possible discharge planning.   Reason for Continuation of Hospitalization: Depression Hallucinations Medication stabilization  Estimated Length of Stay: 3 to 5 days   Attendees: Patient: Tyler White 04/25/2021   Physician: Ethelene Browns, MD 04/25/2021   Nursing:  04/25/2021   RN Care Manager: 04/25/2021   Social Worker: Verdis Frederickson, [place] 04/25/2021   Recreational Therapist:  04/25/2021   Other:  04/25/2021   Other:  04/25/2021   Other: 04/25/2021     Scribe for Treatment Team: Darleen Crocker, [place] 04/25/2021 2:58 PM

## 2021-04-25 NOTE — Progress Notes (Signed)
DAR Note: Patient isolative and reclusive to his room, guarded, denies SI/HI/AVH, states that his sleep quality last night was good, reports a good appetite, denies any current concerns, and taking all meds as scheduled. Pt is being maintained on Q15 minute checks for safety.    04/25/21 1138  Psych Admission Type (Psych Patients Only)  Admission Status Voluntary  Psychosocial Assessment  Patient Complaints None  Eye Contact Avoids  Facial Expression Flat;Pensive;Sullen;Sad  Affect Anxious;Blunted;Depressed;Preoccupied;Sad  Speech Soft  Interaction Guarded  Motor Activity Slow  Appearance/Hygiene Unremarkable  Behavior Characteristics Cooperative  Mood Depressed  Thought Process  Coherency Blocking  Content Preoccupation  Delusions None reported or observed  Perception WDL  Hallucination None reported or observed  Judgment Poor  Confusion UTA  Danger to Self  Current suicidal ideation? Denies  Self-Injurious Behavior No self-injurious ideation or behavior indicators observed or expressed   Agreement Not to Harm Self Yes  Description of Agreement verbal contracts for safety.  Danger to Others  Danger to Others None reported or observed  Danger to Others Abnormal  Harmful Behavior to others No threats or harm toward other people

## 2021-04-25 NOTE — Progress Notes (Signed)
Recreation Therapy Notes  Date: 4.27.22 Time: 1000 Location: 500 Hall Dayroom  Group Topic: Self-Esteem  Goal Area(s) Addresses:  Patient will successfully identify positive attributes about themselves.  Patient will successfully identify benefit of improved self-esteem.   Behavioral Response: Engaged  Intervention: Systems analyst; Markers  Activity: The Mask.  LRT and patients discussed the things that influence the way we see ourselves.  LRT brought up the fact that we as people are quick to name the negative things about ourselves and are unable to name the positive.  Some of this comes from the environment around [place] forcing their ideas of beauty and what is acceptable into our subconscious and we take on those thoughts without realizing it.  Patients were given a blank mask and markers.  Patients were to highlight the positive attributes they have that are often hidden behind the mask.  Patients would then share what they were able to bring out about themselves.  Education:  Self-Esteem, Discharge Planning  Education Outcome: Acknowledges education/In group clarification offered/Needs additional education  Clinical Observations/Feedback: Patient was quiet and seemed shy.  Patient was also rocking back and forth in his chair.  Patient described himself as being different, positive, apprehensive and has a positive outlook on life.   Victorino Sparrow, LRT/CTRS    Victorino Sparrow A 04/25/2021 11:36 AM

## 2021-04-25 NOTE — Progress Notes (Signed)
Patient ID: Tyler White, male   DOB: [birth year], 25 y.o.   MRN: [id number] Patient given paliperidone (INVEGA SUSTENNA) injection 234 mg as ordered in left deltoid muscle. Medication education provided to pt who verbalized understanding.

## 2021-04-25 NOTE — Progress Notes (Signed)
[facility] MD Progress Note  04/25/2021 10:52 AM Tyler White  MRN:  [id number] Subjective: Patient is a 25 year old male with a past psychiatric history significant for schizophrenia versus schizoaffective disorder who originally presented to the [facility] emergency department on 04/20/2021 with suicidal ideation.  Objective: Patient is seen and examined.  Patient is a 25 year old male well-known to the service from multiple psychiatric hospitalizations in the past.  He denies complaint this morning.  He denied any auditory or visual hallucinations.  His paranoid delusions are pretty much fixed.  He has been compliant with medications.  We discussed his several psychiatric hospitalizations as well as his emergency room visits, and the patient has limited insight towards his illness.  We discussed going back on a long-acting injectable medication.  He does believe that the Neurontin has decreased his anxiety to a significant degree.  He otherwise continues on Risperdal 1 mg p.o. daily and 2 mg p.o. nightly as well as the Risperdal agitation protocol.  His vital signs are stable, he is afebrile.  He slept 6.25 hours last night.  His respiratory panel from 4/25 was negative for influenza A, B and coronavirus.  Drug screen was positive for cocaine and marijuana.  Principal Problem: <principal problem not specified> Diagnosis: Active Problems:   Substance-induced psychotic disorder ([place])  Total Time spent with patient: 20 minutes  Past Psychiatric History: See admission H&P  Past Medical History:  Past Medical History:  Diagnosis Date  . Hernia, inguinal, right   . Inguinal hernia    right  . Psychiatric illness   . Schizophrenia ([place])    History reviewed. No pertinent surgical history. Family History:  Family History  Problem Relation Age of Onset  . Psychiatric Illness Mother   . Hypertension Sister    Family Psychiatric  History: See admission H&P Social History:  Social  History   Substance and Sexual Activity  Alcohol Use Yes     Social History   Substance and Sexual Activity  Drug Use Yes  . Types: Marijuana    Social History   Socioeconomic History  . Marital status: Single    Spouse name: Not on file  . Number of children: Not on file  . Years of education: 86  . Highest education level: Not on file  Occupational History  . Not on file  Tobacco Use  . Smoking status: Current Every Day Smoker    Packs/day: 0.50    Years: 5.00    Pack years: 2.50    Types: Cigarettes  . Smokeless tobacco: Never Used  Vaping Use  . Vaping Use: Never used  Substance and Sexual Activity  . Alcohol use: Yes  . Drug use: Yes    Types: Marijuana  . Sexual activity: Yes    Birth control/protection: None  Other Topics Concern  . Not on file  Social History Narrative   ** Merged History Encounter **       ** Merged History Encounter **       Social Determinants of Health   Financial Resource Strain: Not on file  Food Insecurity: Not on file  Transportation Needs: Not on file  Physical Activity: Not on file  Stress: Not on file  Social Connections: Not on file   Additional Social History:                         Sleep: Good  Appetite:  Good  Current Medications: Current Facility-Administered Medications  Medication Dose Route Frequency Provider Last Rate Last Admin  . acetaminophen (TYLENOL) tablet 650 mg  650 mg Oral Q6H PRN Sharma Covert, MD      . alum & mag hydroxide-simeth (MAALOX/MYLANTA) 200-200-20 MG/5ML suspension 30 mL  30 mL Oral Q4H PRN Sharma Covert, MD      . benztropine (COGENTIN) tablet 0.5 mg  0.5 mg Oral BID PRN Sharma Covert, MD      . gabapentin (NEURONTIN) capsule 300 mg  300 mg Oral TID Sharma Covert, MD   300 mg at 04/25/21 0903  . hydrOXYzine (ATARAX/VISTARIL) tablet 25 mg  25 mg Oral TID PRN Sharma Covert, MD   25 mg at 04/24/21 2159  . risperiDONE (RISPERDAL M-TABS) disintegrating  tablet 2 mg  2 mg Oral Q8H PRN Sharma Covert, MD       And  . LORazepam (ATIVAN) tablet 1 mg  1 mg Oral Q6H PRN Sharma Covert, MD       And  . ziprasidone (GEODON) injection 20 mg  20 mg Intramuscular Q6H PRN Sharma Covert, MD      . magnesium hydroxide (MILK OF MAGNESIA) suspension 30 mL  30 mL Oral Daily PRN Sharma Covert, MD      . risperiDONE (RISPERDAL M-TABS) disintegrating tablet 1 mg  1 mg Oral Daily Sharma Covert, MD   1 mg at 04/25/21 5102  . risperiDONE (RISPERDAL M-TABS) disintegrating tablet 2 mg  2 mg Oral QHS Sharma Covert, MD   2 mg at 04/24/21 2019  . traZODone (DESYREL) tablet 50 mg  50 mg Oral QHS PRN Sharma Covert, MD   50 mg at 04/24/21 2159    Lab Results: No results found for this or any previous visit (from the past 48 hour(s)).  Blood Alcohol level:  Lab Results  Component Value Date   ETH <10 04/22/2021   ETH <10 58/52/7782    Metabolic Disorder Labs: Lab Results  Component Value Date   HGBA1C 5.1 11/02/2020   MPG 99.67 11/02/2020   MPG 85.32 05/30/2018   Lab Results  Component Value Date   PROLACTIN 32.5 (H) 05/30/2018   Lab Results  Component Value Date   CHOL 148 11/02/2020   TRIG 47 11/02/2020   HDL 71 11/02/2020   CHOLHDL 2.1 11/02/2020   VLDL 9 11/02/2020   LDLCALC 68 11/02/2020   LDLCALC 107 (H) 05/30/2018    Physical Findings: AIMS:  , ,  ,  ,    CIWA:    COWS:     Musculoskeletal: Strength & Muscle Tone: within normal limits Gait & Station: normal Patient leans: N/A  Psychiatric Specialty Exam:  Presentation  General Appearance: Disheveled  Eye Contact:Fair  Speech:Normal Rate  Speech Volume:Decreased  Handedness:Right   Mood and Affect  Mood:Dysphoric  Affect:Flat   Thought Process  Thought Processes:Goal Directed  Descriptions of Associations:Loose  Orientation:Full (Time, Place and Person)  Thought Content:Delusions; Paranoid Ideation  History of  Schizophrenia/Schizoaffective disorder:Yes  Duration of Psychotic Symptoms:Greater than six months  Hallucinations:Hallucinations: Auditory  Ideas of Reference:Delusions; Paranoia  Suicidal Thoughts:Suicidal Thoughts: No  Homicidal Thoughts:Homicidal Thoughts: No   Sensorium  Memory:Immediate Poor; Recent Poor; Remote Poor  Judgment:Impaired  Insight:Fair   Executive Functions  Concentration:Fair  Attention Span:Fair  [place]   Psychomotor Activity  Psychomotor Activity:Psychomotor Activity: Decreased   Assets  Assets:Desire for Improvement; Resilience   Sleep  Sleep:Sleep: Good Number of  Hours of Sleep: 7.5    Physical Exam: Physical Exam Vitals and nursing note reviewed.  Constitutional:      Appearance: Normal appearance.  HENT:     Head: Normocephalic and atraumatic.  Pulmonary:     Effort: Pulmonary effort is normal.  Neurological:     General: No focal deficit present.     Mental Status: He is alert and oriented to person, place, and time.    ROS Blood pressure 108/75, pulse 88, temperature 98.1 F (36.7 C), temperature source Oral, resp. rate 18, height 5' 8.11" (1.73 m), weight 67.1 kg, SpO2 100 %. Body mass index is 22.43 kg/m.   Treatment Plan Summary: Daily contact with patient to assess and evaluate symptoms and progress in treatment, Medication management and Plan : Patient is seen and examined.  Patient is a 25 year old male with the above-stated past psychiatric history who is seen in follow-up.   Diagnosis: 1.  Schizophrenia, paranoid 2.  Cannabis dependence 3.  Cocaine dependence  Pertinent findings on examination today: 1.  Patient has been compliant with medications. 2.  Patient denied auditory or visual hallucinations. 3.  Paranoid delusions are still present.  These are most likely fixed. 4.  We discussed the long-acting antipsychotic medication options.  Plan: 1.   Continue Cogentin 0.5 mg p.o. twice daily as needed tremors. 2.  Continue gabapentin but increase to 400 mg p.o. 3 times daily for anxiety and mood stability. 3.  Continue Risperdal 1 mg p.o. daily and 2 mg p.o. nightly for psychosis and mood stability. 4.  Discussed with pharmacy eligibility for the long-acting paliperidone injection. 5.  Continue Risperdal agitation protocol as needed. 6.  Continue trazodone 50 mg p.o. nightly as needed insomnia. 7.  Disposition planning-in progress.  Sharma Covert, MD 04/25/2021, 10:52 AM

## 2021-04-25 NOTE — BHH Group Notes (Signed)
LCSW Group Therapy Note   04/25/2021 1:15pm   Type of Therapy and Topic:  Group Therapy:  Positive Affirmations   Participation Level:  Did Not Attend  Description of Group: This group addressed positive affirmation toward self and others. Patients went around the room and identified two positive things about themselves and two positive things about a peer in the room. Patients reflected on how it felt to share something positive with others, to identify positive things about themselves, and to hear positive things from others. Patients were encouraged to have a daily reflection of positive characteristics or circumstances.  Therapeutic Goals 1. Patient will verbalize two of their positive qualities 2. Patient will demonstrate empathy for others by stating two positive qualities about a peer in the group 3. Patient will verbalize their feelings when voicing positive self affirmations and when voicing positive affirmations of others 4. Patients will discuss the potential positive impact on their wellness/recovery of focusing on positive traits of self and others.  Summary of Patient Progress: Patient declined to attend.    Therapeutic Modalities Cognitive Behavioral Therapy Motivational [place], LCSW 04/25/2021 2:18 PM

## 2021-04-26 ENCOUNTER — Other Ambulatory Visit (HOSPITAL_COMMUNITY): Payer: Self-pay

## 2021-04-26 DIAGNOSIS — F1211 Cannabis abuse, in remission: Secondary | ICD-10-CM | POA: Diagnosis present

## 2021-04-26 DIAGNOSIS — F250 Schizoaffective disorder, bipolar type: Principal | ICD-10-CM

## 2021-04-26 DIAGNOSIS — F1210 Cannabis abuse, uncomplicated: Secondary | ICD-10-CM | POA: Diagnosis present

## 2021-04-26 MED ORDER — PALIPERIDONE PALMITATE ER 156 MG/ML IM SUSY
156.0000 mg | PREFILLED_SYRINGE | Freq: Once | INTRAMUSCULAR | Status: DC
  Filled 2021-04-26: qty 1

## 2021-04-26 NOTE — Progress Notes (Signed)
Recreation Therapy Notes  Date: 4.28.22 Time: 1000 Location: 500 Hall Dayroom  Group Topic: Communication, Team Building, Problem Solving  Goal Area(s) Addresses:  Patient will effectively work with peer towards shared goal.  Patient will identify skills used to make activity successful.  Patient will identify how skills used during activity can be applied to reach post d/c goals.   Intervention: STEM Activity- Metallurgist  Activity: Tallest [employer]. In teams of 5-6, patients were given 12 craft pipe cleaners. Using the materials provided, patients were instructed to compete again the opposing team(s) to build the tallest free-standing structure from floor level. The activity was timed; difficulty incrementally increased by Probation officer as Pharmacist, hospital continued. Additional directions given including, placing one arm behind their back, working in silence, and shape stipulations. LRT facilitated post-activity discussion reviewing team processes and necessary communication skills involved in completion. Patients were encouraged to reflect how the skills utilized, or not utilized, in this activity can be incorporated to positively impact support systems post discharge.  Education: Education officer, community, Environmental health practitioner, Discharge Planning   Education Outcome: Acknowledges education/In group clarification offered/Needs additional education.   Clinical Observations/Feedback: Pt did not attend group session.    Victorino Sparrow, LRT/CTRS         Victorino Sparrow A 04/26/2021 11:20 AM

## 2021-04-26 NOTE — BHH Counselor (Signed)
CSW provided this patient with homelessness resources.     Darletta Moll MSW, LCSW Clincal Social Worker  [facility]

## 2021-04-26 NOTE — Progress Notes (Signed)
Pt did not attend orientation group.  

## 2021-04-26 NOTE — Progress Notes (Signed)
   04/25/21 2025  Psych Admission Type (Psych Patients Only)  Admission Status Voluntary  Psychosocial Assessment  Patient Complaints None  Eye Contact Avoids  Facial Expression Flat;Pensive;Sullen;Sad  Affect Anxious;Blunted;Depressed;Preoccupied;Sad  Speech Slow;Elective mutism  Interaction Guarded  Motor Activity Slow  Appearance/Hygiene Unremarkable  Behavior Characteristics Cooperative  Mood Irritable  Thought Process  Coherency Blocking  Content Preoccupation  Delusions None reported or observed  Perception WDL  Hallucination None reported or observed  Judgment Poor  Confusion None  Danger to Self  Current suicidal ideation? Denies  Danger to Others  Danger to Others None reported or observed  Tyler White has been isolative to his room much of the shift.  He did come out of his room for snacks and medications.  He was difficult to understand at times.  He took his medications without difficulty.  He denied any pain or discomfort and appeared to be in no physical distress.  Q 15 minute checks maintained for safety.  We will continue to monitor the progress towards his goals.

## 2021-04-26 NOTE — Plan of Care (Signed)
  Problem: Health Behavior/Discharge Planning: Goal: Compliance with therapeutic regimen will improve Outcome: Progressing   Problem: Activity: Goal: Interest or engagement in leisure activities will improve Outcome: Not Progressing

## 2021-04-26 NOTE — BHH Group Notes (Signed)
Occupational Therapy Group Note Date: 04/26/2021 Group Topic/Focus: Feelings Management  Group Description: Group encouraged increased engagement and participation through discussion focused on Building Happiness. Patients were encouraged to engage in discussion and share what happiness means to them and what brings them true feelings of happiness. Patients were also provided with a handout that offered six strategies to build/improve happiness including identifying gratitude, acts of kindness, exercise, meditation, fostering relationships, and positive journaling.  Participation Level: Minimal   Participation Quality: Minimal Cues   Behavior: Guarded and Shy   Speech/Thought Process: Barely audible and Thought-blocking   Affect/Mood: Constricted   Insight: Limited   Judgement: Limited   Individualization: Tyler White was minimally engaged in their participation of group discussion/activity. Pt was observed rocking back and forth in his chair; restless. Pt shared he was "not sure" what brings him happiness, however did share in the past that it was 'baseball'. He does not remember what position he played, but shared of one game where he hit a homerun.   Modes of Intervention: Discussion, Education and Support  Patient Response to Interventions:  Disengaged and Engaged   Plan: Continue to engage patient in OT groups 2 - 3x/week.  04/26/2021  Ponciano Ort, MOT, OTR/L

## 2021-04-26 NOTE — Progress Notes (Signed)
DAR Note: Patient calm and cooperative, presents with depressed mood and blunted affect, is visible in the day room sitting with peers, but appears restless and has limited interaction with them or staff. Pt denies SI/HI/AVH, states that what is most important for him today is to be discharged, and reports that his goal for today is to talk to the doctor. Pt being given all meds as ordered, and is taking his meds, Q15 minute safety checks being maintained.    04/26/21 1152  Psych Admission Type (Psych Patients Only)  Admission Status Voluntary  Psychosocial Assessment  Patient Complaints None  Eye Contact Avoids  Facial Expression Flat;Pensive;Sullen;Sad  Affect Anxious;Blunted;Preoccupied;Depressed  Speech Slow  Interaction Guarded  Motor Activity Slow  Appearance/Hygiene Unremarkable  Behavior Characteristics Cooperative  Mood Suspicious  Thought Process  Coherency Blocking  Content Preoccupation  Delusions None reported or observed  Perception WDL  Hallucination None reported or observed  Judgment Poor  Confusion None  Danger to Self  Current suicidal ideation? Denies  Self-Injurious Behavior No self-injurious ideation or behavior indicators observed or expressed   Agreement Not to Harm Self Yes  Description of Agreement verbal contracts for safety.  Danger to Others  Danger to Others None reported or observed

## 2021-04-27 MED ORDER — RISPERIDONE 2 MG PO TBDP
2.0000 mg | ORAL_TABLET | Freq: Every day | ORAL | Status: DC
  Administered 2021-04-27 – 2021-04-30 (×4): 2 mg via ORAL
  Filled 2021-04-27 (×7): qty 1

## 2021-04-27 MED ORDER — PALIPERIDONE PALMITATE ER 156 MG/ML IM SUSY
156.0000 mg | PREFILLED_SYRINGE | Freq: Once | INTRAMUSCULAR | Status: AC
  Administered 2021-04-29: 156 mg via INTRAMUSCULAR

## 2021-04-27 NOTE — Plan of Care (Signed)
  Problem: Education: Goal: Knowledge of the prescribed therapeutic regimen will improve Outcome: Progressing   Problem: Activity: Goal: Interest or engagement in leisure activities will improve Outcome: Not Progressing

## 2021-04-27 NOTE — Progress Notes (Signed)
Tyler White has been more visible on the unit this evening.  He did attend evening wrap up group with improved participation in the group conversation.  He denied any SI/HI or AVH.  He stated he has been feeling better and was more talkative to RN this evening.  He took his bedtime medication without difficulty and was given vistaril and trazodone for anxiety and sleep.  He c/o of pain in his right arm, tylenol given with good relief.  He is currently resting with his eyes closed and appears to be asleep.  Q 15 minute checks maintained for safety.  We will continue to monitor the progress towards his goals.   04/27/21 2112  Psych Admission Type (Psych Patients Only)  Admission Status Voluntary  Psychosocial Assessment  Patient Complaints Anxiety  Eye Contact Brief  Facial Expression Flat;Sullen  Affect Anxious;Preoccupied  Speech Slow;Soft  Interaction Cautious;Guarded;Minimal  Motor Activity Fidgety  Appearance/Hygiene Unremarkable  Behavior Characteristics Cooperative  Mood Anxious  Thought Process  Coherency Blocking  Content Preoccupation  Delusions None reported or observed  Perception WDL  Hallucination None reported or observed  Judgment Poor  Confusion None  Danger to Self  Current suicidal ideation? Denies  Danger to Others  Danger to Others None reported or observed

## 2021-04-27 NOTE — Progress Notes (Signed)
Adult Psychoeducational Group Note  Date:  04/28/2021 Time:  12:21 AM  Group Topic/Focus:  Wrap-Up Group:   The focus of this group is to help patients review their daily goal of treatment and discuss progress on daily workbooks.  Participation Level:  Minimal  Participation Quality:  Appropriate  Affect:  Appropriate  Cognitive:  Disorganized  Insight: Appropriate  Engagement in Group:  Limited  Modes of Intervention:  Discussion  Additional Comments:  Pt stated his goal for today was to focus on his treatment plan. Pt stated he accomplished his goal today. Pt stated he talked with his doctor and social worker about his care today.. Pt rated his overall day a 8 out of 10. Pt  Stated been able to contact his mother today improved his overall day. Pt stated he felt better about himself today.Pt stated he was able to attend all groups held today. Pt stated he was able to attend all meals. Pt stated he took all medications provided today. Pt stated his appetite was fair today. Pt rated sleep last night was pretty good. Pt stated the goal tonight was to get some rest. Pt stated he had some physical pain today. Pt stated he had some severe pain in his right arm. Pt rated the pain in his right arm a 9 on the pain level scale. Pt nurse was updated on situation. Pt deny visual hallucinations and auditory issues tonight.  Pt denies thoughts of harming himself or others. Pt stated he would alert staff if anything changed.  Candy Sledge 04/28/2021, 12:21 AM

## 2021-04-27 NOTE — BHH Group Notes (Signed)
Pt did not attend group.  SPIRITUALITY GROUP NOTE  Spirituality group facilitated by Simone Curia, MDiv, [place].  Group Description: Group focused on topic of hope. Patients participated in facilitated discussion around topic, connecting with one another around experiences and definitions for hope. Group members engaged with visual explorer photos, reflecting on what hope looks like for them today. Group engaged in discussion around how their definitions of hope are present today in hospital.  Modalities: Psycho-social ed, Adlerian, Narrative, MI  Patient Progress:

## 2021-04-27 NOTE — Progress Notes (Signed)
   04/26/21 2134  Psych Admission Type (Psych Patients Only)  Admission Status Voluntary  Psychosocial Assessment  Patient Complaints None  Eye Contact Avoids  Facial Expression Flat;Pensive;Sullen;Sad  Affect Anxious;Blunted;Preoccupied;Depressed  Speech Slow  Interaction Cautious;Guarded;Minimal  Motor Activity Slow  Appearance/Hygiene Unremarkable  Behavior Characteristics Cooperative  Mood Suspicious;Preoccupied  Thought Process  Coherency Blocking  Content Preoccupation  Delusions None reported or observed  Perception WDL  Hallucination None reported or observed  Judgment Poor  Confusion None  Danger to Self  Current suicidal ideation? Denies  Danger to Others  Danger to Others None reported or observed  Tyler White was isolative to his room much of the evening.  Minimal interaction with staff or peers and does not initiate conversations.  He answers questions with one word answers.  He denied SI/HI or A/V hallucinations.  He took his hs medication without difficulty.  He voiced no questions about his medication.  He is currently resting with his eyes closed and appears to be in no physical distress.  Q 15 minute checks maintained for safety.  We will continue to monitor the progress towards his goals.

## 2021-04-27 NOTE — Progress Notes (Signed)
[facility] MD Progress Note  04/27/2021 1:29 PM Tyler White  MRN:  [id number]   Chief Complaint: SI, AVH  Subjective:  Tyler White is a 25 y.o. male with a history of schizoaffective d/o vs schizophrenia, cannabis use d/o, and stimulant use d/o -cocaine type, who was initially admitted for inpatient psychiatric hospitalization on 04/23/2021 for management of AVH and SI in the context of medication noncompliance. The patient is currently on Hospital Day 4.   Chart Review from last 24 hours:  The patient's chart was reviewed and nursing notes were reviewed. The patient's case was discussed in multidisciplinary team meeting. Per nursing, he was mostly isolative with limited interactions with peers and staff. He minimally engaged in group and had thought blocking noted. No behavioral issues noted. Per [facility] he was compliant with scheduled medications and did receive Vistaril X 1 for anxiety.  Information Obtained Today During Patient Interview: The patient was seen and evaluated on the unit. He states he is sleeping "fine" and reports "good" appetite. He continues to have limited engagement during interview and answers mostly with 1-2 words or simple responses to direct questions. He states his mood is "okay" and he questions when he might be discharged. He was advised that the plans are for him to receive a 2nd loading injection of Invega sustenna on Sunday and then potentially discharge early next week if housing and safety planning are arranged. He denies medication side-effects and denies AVH, ideas of reference, SI or HI. He admits to belief in thought withdrawal by unknown persons but denies thought broadcasting or insertion. He denies paranoia but appears guarded on exam. He denies cravings for drugs or acute withdrawal from cocaine and THC. He minimizes his drug use when questioned and is evasive. He voices no physical complaints.   Principal Problem: Schizoaffective disorder, bipolar type  ([place]) Diagnosis: Principal Problem:   Schizoaffective disorder, bipolar type ([place]) Active Problems:   Cocaine abuse ([place])   Marijuana abuse  Total Time Spent in Direct Patient Care:  I personally spent 30 minutes on the unit in direct patient care. The direct patient care time included face-to-face time with the patient, reviewing the patient's chart, communicating with other professionals, and coordinating care. Greater than 50% of this time was spent in counseling or coordinating care with the patient regarding goals of hospitalization, psycho-education, and discharge planning needs.  Past Psychiatric History: see admission H&P  Past Medical History:  Past Medical History:  Diagnosis Date  . Hernia, inguinal, right   . Inguinal hernia    right  . Psychiatric illness   . Schizophrenia ([place])     Family History:  Family History  Problem Relation Age of Onset  . Psychiatric Illness Mother   . Hypertension Sister    Family Psychiatric  History: see admission H&P  Social History:  Social History   Substance and Sexual Activity  Alcohol Use Yes     Social History   Substance and Sexual Activity  Drug Use Yes  . Types: Marijuana    Social History   Socioeconomic History  . Marital status: Single    Spouse name: Not on file  . Number of children: Not on file  . Years of education: 68  . Highest education level: Not on file  Occupational History  . Not on file  Tobacco Use  . Smoking status: Current Every Day Smoker    Packs/day: 0.50    Years: 5.00    Pack years: 2.50    Types:  Cigarettes  . Smokeless tobacco: Never Used  Vaping Use  . Vaping Use: Never used  Substance and Sexual Activity  . Alcohol use: Yes  . Drug use: Yes    Types: Marijuana  . Sexual activity: Yes    Birth control/protection: None  Other Topics Concern  . Not on file  Social History Narrative   ** Merged History Encounter **       ** Merged History Encounter **       Social  Determinants of Health   Financial Resource Strain: Not on file  Food Insecurity: Not on file  Transportation Needs: Not on file  Physical Activity: Not on file  Stress: Not on file  Social Connections: Not on file   Sleep: Good  Appetite:  Good  Current Medications: Current Facility-Administered Medications  Medication Dose Route Frequency Provider Last Rate Last Admin  . acetaminophen (TYLENOL) tablet 650 mg  650 mg Oral Q6H PRN Sharma Covert, MD   650 mg at 04/27/21 [phone]  . alum & mag hydroxide-simeth (MAALOX/MYLANTA) 200-200-20 MG/5ML suspension 30 mL  30 mL Oral Q4H PRN Sharma Covert, MD      . benztropine (COGENTIN) tablet 0.5 mg  0.5 mg Oral BID PRN Sharma Covert, MD      . gabapentin (NEURONTIN) capsule 400 mg  400 mg Oral TID Sharma Covert, MD   400 mg at 04/27/21 1245  . hydrOXYzine (ATARAX/VISTARIL) tablet 25 mg  25 mg Oral TID PRN Sharma Covert, MD   25 mg at 04/26/21 2134  . risperiDONE (RISPERDAL M-TABS) disintegrating tablet 2 mg  2 mg Oral Q8H PRN Sharma Covert, MD       And  . LORazepam (ATIVAN) tablet 1 mg  1 mg Oral Q6H PRN Sharma Covert, MD       And  . ziprasidone (GEODON) injection 20 mg  20 mg Intramuscular Q6H PRN Sharma Covert, MD      . magnesium hydroxide (MILK OF MAGNESIA) suspension 30 mL  30 mL Oral Daily PRN Sharma Covert, MD      . Derrill Memo ON 04/29/2021] paliperidone (INVEGA SUSTENNA) injection 156 mg  156 mg Intramuscular Once Sharma Covert, MD      . risperiDONE (RISPERDAL M-TABS) disintegrating tablet 2 mg  2 mg Oral QHS Sharma Covert, MD   2 mg at 04/26/21 2134  . risperiDONE (RISPERDAL M-TABS) disintegrating tablet 2 mg  2 mg Oral Daily Viann Fish E, MD   2 mg at 04/27/21 [id number]  . traZODone (DESYREL) tablet 50 mg  50 mg Oral QHS PRN Sharma Covert, MD   50 mg at 04/24/21 2159  . Vitamin D (Ergocalciferol) (DRISDOL) capsule 50,000 Units  50,000 Units Oral Q7 days Harlow Asa, MD   50,000  Units at 04/26/21 [id number]    Lab Results: No results found for this or any previous visit (from the past 48 hour(s)).  Blood Alcohol level:  Lab Results  Component Value Date   ETH <10 04/22/2021   ETH <10 [id number]    Metabolic Disorder Labs: Lab Results  Component Value Date   HGBA1C 5.1 11/02/2020   MPG 99.67 11/02/2020   MPG 85.32 05/30/2018   Lab Results  Component Value Date   PROLACTIN 32.5 (H) 05/30/2018   Lab Results  Component Value Date   CHOL 148 11/02/2020   TRIG 47 11/02/2020   HDL 71 11/02/2020   CHOLHDL 2.1 11/02/2020   VLDL  9 11/02/2020   LDLCALC 68 11/02/2020   LDLCALC 107 (H) 05/30/2018   Musculoskeletal: Strength & Muscle Tone: within normal limits Gait & Station: unassessed, in bed Patient leans: N/A  Psychiatric Specialty Exam: Physical Exam Vitals reviewed.  HENT:     Head: Normocephalic.  Pulmonary:     Effort: Pulmonary effort is normal.  Neurological:     Mental Status: He is alert.     Review of Systems  Respiratory: Negative for shortness of breath.   Cardiovascular: Negative for chest pain.  Gastrointestinal: Negative for diarrhea, nausea and vomiting.    Blood pressure 119/82, pulse (!) 122, temperature 98 F (36.7 C), temperature source Oral, resp. rate 18, height 5' 8.11" (1.73 m), weight 67.1 kg, SpO2 100 %.Body mass index is 22.43 kg/m.  General Appearance: casually dressed, fair hygiene, appears stated age  Eye Contact:  Fair  Speech:  minimal and mumbling quality, occasional speech latency  Volume:  Decreased  Mood:  aloof  Affect:  Constricted and guarded  Thought Process:  Goal Directed  Orientation:  Oriented to self, year, month and city  Thought Content: Denies AVH but endorses belief in thought withdrawal; appears paranoid and guarded on exam; denies ideas of reference  Suicidal Thoughts:  No  Homicidal Thoughts:  No  Memory:  Recent;   Fair  Judgement:  Fair  Insight:  Lacking  Psychomotor Activity:   Normal  Concentration:  Concentration: Fair  Recall:  [employer] of Knowledge:  Fair  Language:  Fair  Akathisia:  Negative  Assets:  Communication Skills Desire for Improvement Resilience  ADL's:  Intact  Cognition:  WNL  Sleep:  Number of Hours: 6   Treatment Plan Summary: Diagnoses / Active Problems: Schizoaffective d/o bipolar type by hx (r/o schizophrenia) Cannabis use d/o Stimulant use d/o - cocaine type Vitamin D deficiency  PLAN: 1. Safety and Monitoring:  -- Voluntary admission to inpatient psychiatric unit for safety, stabilization and treatment  -- Daily contact with patient to assess and evaluate symptoms and progress in treatment  -- Patient's case to be discussed in multi-disciplinary team meeting  -- Observation Level : q15 minute checks  -- Vital signs:  q12 hours  -- Precautions: suicide  2. Psychiatric Diagnoses and Treatment:  Schizoaffective d/o by hx (r/o schizophrenia) -- Continue Cogentin 0.5mg  bid PRN EPS/tremors -- Increase Risperdal M-tabs 2mg  po bid for residual psychosis as bridging dose while LAI reaches steady state -- Received Invega sustenna 234mg  IM on 04/25/21 and will get 2nd loading injection of 156mg  IM on 04/29/21 -- Continue Neurontin 400mg  tid for anxiety and mood stability -- Continue Trazodone 50mg  po qhs PRN Insomnia  -- Continue Vistaril 25mg  tid PRN anxiety -- Continue Risperdal agitation protocol PRN  -- Metabolic profile and EKG monitoring obtained while on an atypical antipsychotic (BMI:22.43; Lipid Panel: cholesterol 181, triglycerides 90, HDL 54, LDL 109;HbgA1c:5.2 QTc:379ms)   -- Encouraged patient to participate in unit milieu and in scheduled group therapies   -- Short Term Goals: Ability to demonstrate self-control will improve and Ability to identify and develop effective coping behaviors will improve  -- Long Term Goals: Improvement in symptoms so as ready for discharge   Cannabis use d/o  Stimulant use d/o - cocaine  type  -- UDS positive for THC and cocaine  -- Patient counseled on the need to abstain from illicit substances after discharge  -- Short Term Goals: Ability to identify triggers associated with substance abuse/mental health issues will improve  --  Long Term Goals: Improvement in symptoms so as ready for discharge   3. Medical Issues Being Addressed:   Vitamin D deficiency   -- Start Vitamin D 50,000IU weekly  4. Discharge Planning:   -- Social work and case management to assist with discharge planning and identification of hospital follow-up needs prior to discharge  -- Estimated LOS: 3-4 days  -- Discharge Concerns: Need to establish a safety plan; Medication compliance and effectiveness  -- Discharge Goals: Return home with outpatient referrals for mental health follow-up including medication management/psychotherapy  Harlow Asa, MD, FAPA 04/27/2021, 1:29 PM

## 2021-04-27 NOTE — Progress Notes (Signed)
Pt did not attend orientation group.  

## 2021-04-27 NOTE — Progress Notes (Signed)
Recreation Therapy Notes  Date: 4.29.22 Time: 1000 Location: 500 Hall Dayroom  Group Topic: Decision Making, Problem Solving, Communication  Goal Area(s) Addresses:  Patient will effectively work with peer towards shared goal.  Patient will identify factors that guided their decision making.  Patient will pro-socially communicate ideas during group session.   Intervention: Survival Scenario - pencil, paper  Activity:  Patients were given a scenario that they were going to be stranded on a deserted island for several months before being rescued. Writer tasked them with making a list of 15 things they would choose to bring with them for "survival". The list of items was prioritized most important to least. Each patient would come up with their own list, then work together to create a new list of 15 items while in a group of 3-5 peers. LRT discussed each person's list and how it differed from others. The debrief included discussion of priorities, good decisions versus bad decisions, and how it is important to think before acting so we can make the best decision possible. LRT tied the concept of effective communication among group members to patient's support systems outside of the hospital and its benefit post discharge.  Education: Education officer, community, Priorities, Support System, Discharge Planning   Education Outcome:   Acknowledges education/In group clarification/Needs additional education  Clinical Observations/Feedback: Pt did not attend group session.    Victorino Sparrow, LRT/CTRS         Victorino Sparrow A 04/27/2021 12:19 PM

## 2021-04-27 NOTE — [facility] Group Notes (Signed)
[place] LCSW Group Therapy   04/27/2021 11:40 AM    Type of Therapy and Topic:  Group Therapy:  Strengths Exploration   Participation Level: Minimal  Description of Group: This group allows individuals to explore their strengths, learn to use strengths in new ways to improve well-being. Strengths-based interventions involve identifying strengths, understanding how they are used, and learning new ways to apply them. Individuals will identify their strengths, and then explore their roles in different areas of life (relationships, professional life, and personal fulfillment). Individuals will think about ways in which they currently use their strengths, along with new ways they could begin using them.    Therapeutic Goals 1. Patient will verbalize two of their strengths 2. Patient will identify how their strengths are currently used 3. Patient will identify two new ways to apply their strengths  4. Patients will create a plan to apply their strengths in their daily lives     Summary of Patient Progress:  Patient accepted worksheet, but declined to discuss with CSW.       Therapeutic Modalities Cognitive Behavioral Therapy Motivational Interviewing   Darletta Moll MSW, [place] Worker  [facility]

## 2021-04-28 NOTE — Progress Notes (Signed)
Pt did not attend orientation group.  

## 2021-04-28 NOTE — Progress Notes (Signed)
Pt shares that drug abuse is what brought him in for this admission. He said that he has been having cravings for cocaine and weed. He said that he wasn't taking his medications prior to admission because of his Medicaid. He does endorse racing thoughts and paranoia, but doesn't elaborate further. He did attend group last night and furthers little.  Pt denies SI/HI and AVH. Active listening, reassurance, and support provided. Q 15 min safety checks continue. Pt's safety has been maintained.   04/28/21 2020  Psych Admission Type (Psych Patients Only)  Admission Status Voluntary  Psychosocial Assessment  Patient Complaints Anxiety;Substance abuse;Suspiciousness  Eye Contact Brief  Facial Expression Flat  Affect Anxious;Preoccupied  Speech Soft;Slow  Interaction Forwards little;Cautious  Motor Activity Fidgety  Appearance/Hygiene Unremarkable  Behavior Characteristics Cooperative;Appropriate to situation;Fidgety  Mood Anxious;Preoccupied;Pleasant  Thought Process  Coherency Blocking  Content Preoccupation;Paranoia  Delusions Paranoid  Perception WDL  Hallucination None reported or observed  Judgment Poor  Confusion None  Danger to Self  Current suicidal ideation? Denies  Self-Injurious Behavior No self-injurious ideation or behavior indicators observed or expressed   Danger to Others  Danger to Others None reported or observed

## 2021-04-28 NOTE — Progress Notes (Signed)
Adult Psychoeducational Group Note  Date:  04/28/2021 Time:  10:42 PM  Group Topic/Focus:  Wrap-Up Group:   The focus of this group is to help patients review their daily goal of treatment and discuss progress on daily workbooks.  Participation Level:  Minimal  Participation Quality:  Appropriate  Affect:  Anxious  Cognitive:  Disorganized  Insight: Limited  Engagement in Group:  Limited and Poor  Modes of Intervention:  Discussion  Additional Comments:  Pt stated his goal for today was to focus on his treatment plan. Pt stated he accomplished his goal today. Pt stated he did not get a chance to talked with his doctor or his social worker about his care today. Pt rated his overall day a 10. Pt he made no calls today. Pt stated he felt better about himself today.Pt stated he was able to attend all groups held today. Pt stated he was able to attend all meals. Pt stated he took all medications provided today. Pt stated his appetite was  good today. Pt rated sleep last night was pretty good. Pt stated the goal tonight was to get some rest. Pt stated he had no physical pain tonight.  Pt deny visual hallucinations and auditory issues tonight.  Pt denies thoughts of harming himself or others. Pt stated he would alert staff if anything changed.  Candy Sledge 04/28/2021, 10:42 PM

## 2021-04-28 NOTE — B[facility] Group Notes (Signed)
.  Psychoeducational Group Note  Date: 04/28/2021 Time: 0900-1000    Goal Setting   Purpose of Group: This group helps to provide patients with the steps of setting a goal that is specific, measurable, attainable, realistic and time specific. A discussion on how we keep ourselves stuck with negative self talk.    Participation Level:  Did not attend   Tyler White

## 2021-04-28 NOTE — Progress Notes (Addendum)
[facility] MD Progress Note  04/28/2021 5:35 PM Tyler White  MRN:  [id number]   Chief Complaint: SI, AVH  Subjective:  Tyler White is a 25 y.o. male with a history of schizoaffective d/o vs schizophrenia, cannabis use d/o, and stimulant use d/o -cocaine type, who was initially admitted for inpatient psychiatric hospitalization on 04/23/2021 for management of AVH and SI in the context of medication noncompliance. The patient is currently on Hospital Day 5.   Chart Review from last 24 hours:  The patient's chart was reviewed and nursing notes were reviewed. The patient's case was discussed in multidisciplinary team meeting. Per nursing, he was mostly isolative with limited interactions with peers and staff. He minimally engaged in group and had thought blocking noted. No behavioral issues noted. Per [facility] he was compliant with scheduled medications and did receive Vistaril X 1 for anxiety. He is scheduled for his second Invega IM injection 156 mg on 04/29/2021  Information Obtained Today During Patient Interview: The patient was seen and evaluated on the unit. He states his sleep was "good" and reports "good" appetite. He continues to have limited engagement during interview and answers mostly with 1-2 words or simple responses to direct questions. He states his mood is "okay" and he questions when he might be discharged. He was advised that the plans are for him to receive a 2nd loading injection of Invega sustenna on Sunday and then potentially discharge early next week if housing and safety planning are arranged. He denies medication side-effects and denies AVH, ideas of reference, SI or HI. He denies paranoia but appears guarded on exam. He denies cravings for drugs or acute withdrawal from cocaine and THC. He minimizes his drug use when questioned and is evasive. He stated he has been to the hospital many times in the past. He stated "I live on the streets and getting my medications is hard because I  have medicaid. I don't know if I will keep taking them when I am out of here." He voices no physical complaints.   Principal Problem: Schizoaffective disorder, bipolar type ([place]) Diagnosis: Principal Problem:   Schizoaffective disorder, bipolar type ([place]) Active Problems:   Cocaine abuse ([place])   Marijuana abuse  Total Time Spent in Direct Patient Care: 25 minutes  Past Psychiatric History: see admission H&P  Past Medical History:  Past Medical History:  Diagnosis Date  . Hernia, inguinal, right   . Inguinal hernia    right  . Psychiatric illness   . Schizophrenia ([place])     Family History:  Family History  Problem Relation Age of Onset  . Psychiatric Illness Mother   . Hypertension Sister    Family Psychiatric  History: see admission H&P  Social History:  Social History   Substance and Sexual Activity  Alcohol Use Yes     Social History   Substance and Sexual Activity  Drug Use Yes  . Types: Marijuana    Social History   Socioeconomic History  . Marital status: Single    Spouse name: Not on file  . Number of children: Not on file  . Years of education: 46  . Highest education level: Not on file  Occupational History  . Not on file  Tobacco Use  . Smoking status: Current Every Day Smoker    Packs/day: 0.50    Years: 5.00    Pack years: 2.50    Types: Cigarettes  . Smokeless tobacco: Never Used  Vaping Use  . Vaping Use: Never used  Substance and Sexual Activity  . Alcohol use: Yes  . Drug use: Yes    Types: Marijuana  . Sexual activity: Yes    Birth control/protection: None  Other Topics Concern  . Not on file  Social History Narrative   ** Merged History Encounter **       ** Merged History Encounter **       Social Determinants of Health   Financial Resource Strain: Not on file  Food Insecurity: Not on file  Transportation Needs: Not on file  Physical Activity: Not on file  Stress: Not on file  Social Connections: Not on file    Sleep: Good  Appetite:  Good  Current Medications: Current Facility-Administered Medications  Medication Dose Route Frequency Provider Last Rate Last Admin  . acetaminophen (TYLENOL) tablet 650 mg  650 mg Oral Q6H PRN Sharma Covert, MD   650 mg at 04/27/21 1938  . alum & mag hydroxide-simeth (MAALOX/MYLANTA) 200-200-20 MG/5ML suspension 30 mL  30 mL Oral Q4H PRN Sharma Covert, MD      . benztropine (COGENTIN) tablet 0.5 mg  0.5 mg Oral BID PRN Sharma Covert, MD      . gabapentin (NEURONTIN) capsule 400 mg  400 mg Oral TID Sharma Covert, MD   400 mg at 04/28/21 1718  . hydrOXYzine (ATARAX/VISTARIL) tablet 25 mg  25 mg Oral TID PRN Sharma Covert, MD   25 mg at 04/27/21 2110  . risperiDONE (RISPERDAL M-TABS) disintegrating tablet 2 mg  2 mg Oral Q8H PRN Sharma Covert, MD       And  . LORazepam (ATIVAN) tablet 1 mg  1 mg Oral Q6H PRN Sharma Covert, MD       And  . ziprasidone (GEODON) injection 20 mg  20 mg Intramuscular Q6H PRN Sharma Covert, MD      . magnesium hydroxide (MILK OF MAGNESIA) suspension 30 mL  30 mL Oral Daily PRN Sharma Covert, MD      . Derrill Memo ON 04/29/2021] paliperidone (INVEGA SUSTENNA) injection 156 mg  156 mg Intramuscular Once Sharma Covert, MD      . risperiDONE (RISPERDAL M-TABS) disintegrating tablet 2 mg  2 mg Oral QHS Sharma Covert, MD   2 mg at 04/27/21 2110  . risperiDONE (RISPERDAL M-TABS) disintegrating tablet 2 mg  2 mg Oral Daily Viann Fish E, MD   2 mg at 04/28/21 8921  . traZODone (DESYREL) tablet 50 mg  50 mg Oral QHS PRN Sharma Covert, MD   50 mg at 04/27/21 2112  . Vitamin D (Ergocalciferol) (DRISDOL) capsule 50,000 Units  50,000 Units Oral Q7 days Harlow Asa, MD   50,000 Units at 04/26/21 1941    Lab Results: No results found for this or any previous visit (from the past 48 hour(s)).  Blood Alcohol level:  Lab Results  Component Value Date   ETH <10 04/22/2021   ETH <10  74/07/1447    Metabolic Disorder Labs: Lab Results  Component Value Date   HGBA1C 5.1 11/02/2020   MPG 99.67 11/02/2020   MPG 85.32 05/30/2018   Lab Results  Component Value Date   PROLACTIN 32.5 (H) 05/30/2018   Lab Results  Component Value Date   CHOL 148 11/02/2020   TRIG 47 11/02/2020   HDL 71 11/02/2020   CHOLHDL 2.1 11/02/2020   VLDL 9 11/02/2020   LDLCALC 68 11/02/2020   LDLCALC 107 (H) 05/30/2018   Musculoskeletal: Strength &  Muscle Tone: within normal limits Gait & Station: unassessed, in bed Patient leans: N/A  Psychiatric Specialty Exam: Physical Exam Vitals and nursing note reviewed.  HENT:     Head: Normocephalic.  Pulmonary:     Effort: Pulmonary effort is normal.  Musculoskeletal:        General: Normal range of motion.     Cervical back: Normal range of motion.  Neurological:     Mental Status: He is alert and oriented to person, place, and time.  Psychiatric:        Attention and Perception: He does not perceive auditory or visual hallucinations.        Mood and Affect: Mood is depressed.        Speech: Speech normal.        Behavior: Behavior is cooperative.        Thought Content: Thought content is not paranoid or delusional. Thought content does not include homicidal or suicidal ideation. Thought content does not include homicidal or suicidal plan.     Review of Systems  Constitutional: Negative.   HENT: Negative for congestion and sore throat.   Respiratory: Negative for shortness of breath.   Cardiovascular: Negative for chest pain.  Gastrointestinal: Negative for diarrhea, nausea and vomiting.  Genitourinary: Negative.   Musculoskeletal: Negative.   Neurological: Negative.     Blood pressure 110/83, pulse 93, temperature 98.6 F (37 C), temperature source Oral, resp. rate 18, height 5' 8.11" (1.73 m), weight 67.1 kg, SpO2 100 %.Body mass index is 22.43 kg/m.  General Appearance: casually dressed, fair hygiene, appears stated age   Eye Contact:  Fair  Speech:  minimal and mumbling quality, occasional speech latency  Volume:  Decreased  Mood:  aloof  Affect:  Constricted and guarded  Thought Process:  Goal Directed  Orientation:  Oriented to self, year, month and city  Thought Content: Denies AVH but endorses belief in thought withdrawal; appears paranoid and guarded on exam; denies ideas of reference  Suicidal Thoughts:  No  Homicidal Thoughts:  No  Memory:  Recent;   Fair  Judgement:  Fair  Insight:  Lacking  Psychomotor Activity:  Normal  Concentration:  Concentration: Fair  Recall:  [employer] of Knowledge:  Fair  Language:  Fair  Akathisia:  Negative  Assets:  Communication Skills Desire for Improvement Resilience  ADL's:  Intact  Cognition:  WNL  Sleep:  Number of Hours: 6.25   Treatment Plan Summary: Diagnoses / Active Problems: Schizoaffective d/o bipolar type by hx (r/o schizophrenia) Cannabis use d/o Stimulant use d/o - cocaine type Vitamin D deficiency  PLAN: 1. Safety and Monitoring:  -- Voluntary admission to inpatient psychiatric unit for safety, stabilization and treatment  -- Daily contact with patient to assess and evaluate symptoms and progress in treatment  -- Patient's case to be discussed in multi-disciplinary team meeting  -- Observation Level : q15 minute checks  -- Vital signs:  q12 hours  -- Precautions: suicide  2. Psychiatric Diagnoses and Treatment:  Schizoaffective d/o by hx (r/o schizophrenia) -- Continue Cogentin 0.5mg  bid PRN EPS/tremors -- Increase Risperdal M-tabs 2mg  po bid for residual psychosis as bridging dose while LAI reaches steady state -- Received Invega sustenna 234mg  IM on 04/25/21 and will get 2nd loading injection of 156mg  IM on 04/29/21 -- Continue Neurontin 400mg  tid for anxiety and mood stability -- Continue Trazodone 50mg  po qhs PRN Insomnia  -- Continue Vistaril 25mg  tid PRN anxiety -- Continue Risperdal agitation protocol PRN  --  Metabolic  profile and EKG monitoring obtained while on an atypical antipsychotic (BMI:22.43; Lipid Panel: cholesterol 181, triglycerides 90, HDL 54, LDL 109;HbgA1c:5.2 QTc:366ms)   -- Encouraged patient to participate in unit milieu and in scheduled group therapies   -- Short Term Goals: Ability to demonstrate self-control will improve and Ability to identify and develop effective coping behaviors will improve  -- Long Term Goals: Improvement in symptoms so as ready for discharge   Cannabis use d/o  Stimulant use d/o - cocaine type  -- UDS positive for THC and cocaine  -- Patient counseled on the need to abstain from illicit substances after discharge  -- Short Term Goals: Ability to identify triggers associated with substance abuse/mental health issues will improve  -- Long Term Goals: Improvement in symptoms so as ready for discharge   3. Medical Issues Being Addressed:   Vitamin D deficiency   -- Start Vitamin D 50,000IU weekly. First dose given on 04/26/2021  4. Discharge Planning:   -- Social work and case management to assist with discharge planning and identification of hospital follow-up needs prior to discharge  -- Estimated LOS: 3-4 days  -- Discharge Concerns: Need to establish a safety plan; Medication compliance and effectiveness  -- Discharge Goals: Return home with outpatient referrals for mental health follow-up including medication management/psychotherapy  Ethelene Hal, NP 04/28/2021, 5:35 PM

## 2021-04-28 NOTE — Progress Notes (Signed)
   04/28/21 1000  Psych Admission Type (Psych Patients Only)  Admission Status Voluntary  Psychosocial Assessment  Patient Complaints Isolation  Eye Contact Brief  Facial Expression Flat;Sullen  Affect Anxious;Preoccupied  Speech Slow;Soft  Interaction Cautious;Guarded;Minimal  Motor Activity Fidgety  Appearance/Hygiene Unremarkable  Behavior Characteristics Cooperative  Mood Depressed  Thought Process  Coherency Blocking  Content Preoccupation  Delusions None reported or observed  Perception WDL  Hallucination None reported or observed  Judgment Poor  Confusion None  Danger to Self  Current suicidal ideation? Denies  Danger to Others  Danger to Others None reported or observed

## 2021-04-29 NOTE — Progress Notes (Signed)
[facility] MD Progress Note  04/29/2021 1:02 PM Tyler White  MRN:  [id number]   Chief Complaint: SI, AVH  Subjective:  Tyler White is a 25 y.o. male with a history of schizoaffective d/o vs schizophrenia, cannabis use d/o, and stimulant use d/o -cocaine type, who was initially admitted for inpatient psychiatric hospitalization on 04/23/2021 for management of AVH and SI in the context of medication noncompliance. The patient is currently on Hospital Day 6.   Chart Review from last 24 hours:  The patient's chart was reviewed and nursing notes were reviewed. The patient's case was discussed in multidisciplinary team meeting. Per nursing, he was mostly isolative with limited interactions with peers and staff. He minimally engaged in group and had thought blocking noted. No behavioral issues noted. Per [facility] he was compliant with scheduled medications and did receive Vistaril X 1 for anxiety. He is scheduled for his second Invega IM injection 156 mg on 04/29/2021  Information Obtained Today During Patient Interview: The patient was seen and evaluated on the unit. He states his sleep was "good" and reports "good" appetite. He continues to have limited engagement during interview and answers mostly with 1-2 words or simple responses to direct questions. He states his mood is "okay" and he questions when he might be discharged. He was advised that the plans are for him to receive a 2nd loading injection of Invega sustenna on Sunday and then potentially discharge early next week if housing and safety planning are arranged. He denies medication side-effects and denies AVH, ideas of reference, SI or HI. He denies paranoia but appears guarded on exam. He denies cravings for drugs or acute withdrawal from cocaine and THC. He minimizes his drug use when questioned and is evasive. He stated he has been to the hospital many times in the past. He stated "I live on the streets and getting my medications is hard because I  have medicaid. I don't know if I will keep taking them when I am out of here." He voices no physical complaints.   Evaluation on the unit today: Patient is seen and evaluated, chart reviewed. Patient is seen at the nurse's station. He received his second Invega injection today. He denies SI/HI. He denies auditory and vu=isual hallucinations. He denies thought extraction. He admits he was hearing voices, seeing things and thought someone was extracting thoughts form his head when he was admitted. He is taking his medications and believes they are the reason for the hallucinations going away. He stated he would keep taking his medications when he is discharged. He stated the gabapentin has helped him. He is calm and cooperative. He maintains fair eye contact. He reported good sleep and a good appetite. He attended group this morning. He has had no behavioral issues on the unit. He did receive Trazodone PRN x 1 for sleep and Vistaril 25 mg PRN x 1 for anxiety yesterday. Will continue to ,monitor for safety. Encouragement and support provided.   Principal Problem: Schizoaffective disorder, bipolar type ([place]) Diagnosis: Principal Problem:   Schizoaffective disorder, bipolar type ([place]) Active Problems:   Cocaine abuse ([place])   Marijuana abuse  Total Time Spent in Direct Patient Care: 25 minutes  Past Psychiatric History: see admission H&P  Past Medical History:  Past Medical History:  Diagnosis Date  . Hernia, inguinal, right   . Inguinal hernia    right  . Psychiatric illness   . Schizophrenia ([place])     Family History:  Family History  Problem Relation  Age of Onset  . Psychiatric Illness Mother   . Hypertension Sister    Family Psychiatric  History: see admission H&P  Social History:  Social History   Substance and Sexual Activity  Alcohol Use Yes     Social History   Substance and Sexual Activity  Drug Use Yes  . Types: Marijuana    Social History   Socioeconomic History  .  Marital status: Single    Spouse name: Not on file  . Number of children: Not on file  . Years of education: 22  . Highest education level: Not on file  Occupational History  . Not on file  Tobacco Use  . Smoking status: Current Every Day Smoker    Packs/day: 0.50    Years: 5.00    Pack years: 2.50    Types: Cigarettes  . Smokeless tobacco: Never Used  Vaping Use  . Vaping Use: Never used  Substance and Sexual Activity  . Alcohol use: Yes  . Drug use: Yes    Types: Marijuana  . Sexual activity: Yes    Birth control/protection: None  Other Topics Concern  . Not on file  Social History Narrative   ** Merged History Encounter **       ** Merged History Encounter **       Social Determinants of Health   Financial Resource Strain: Not on file  Food Insecurity: Not on file  Transportation Needs: Not on file  Physical Activity: Not on file  Stress: Not on file  Social Connections: Not on file   Sleep: Good  Appetite:  Good  Current Medications: Current Facility-Administered Medications  Medication Dose Route Frequency Provider Last Rate Last Admin  . acetaminophen (TYLENOL) tablet 650 mg  650 mg Oral Q6H PRN Sharma Covert, MD   650 mg at 04/27/21 1938  . alum & mag hydroxide-simeth (MAALOX/MYLANTA) 200-200-20 MG/5ML suspension 30 mL  30 mL Oral Q4H PRN Sharma Covert, MD      . benztropine (COGENTIN) tablet 0.5 mg  0.5 mg Oral BID PRN Sharma Covert, MD      . gabapentin (NEURONTIN) capsule 400 mg  400 mg Oral TID Sharma Covert, MD   400 mg at 04/29/21 1209  . hydrOXYzine (ATARAX/VISTARIL) tablet 25 mg  25 mg Oral TID PRN Sharma Covert, MD   25 mg at 04/28/21 2020  . risperiDONE (RISPERDAL M-TABS) disintegrating tablet 2 mg  2 mg Oral Q8H PRN Sharma Covert, MD       And  . LORazepam (ATIVAN) tablet 1 mg  1 mg Oral Q6H PRN Sharma Covert, MD       And  . ziprasidone (GEODON) injection 20 mg  20 mg Intramuscular Q6H PRN Sharma Covert,  MD      . magnesium hydroxide (MILK OF MAGNESIA) suspension 30 mL  30 mL Oral Daily PRN Sharma Covert, MD      . risperiDONE (RISPERDAL M-TABS) disintegrating tablet 2 mg  2 mg Oral QHS Sharma Covert, MD   2 mg at 04/28/21 2020  . risperiDONE (RISPERDAL M-TABS) disintegrating tablet 2 mg  2 mg Oral Daily Nelda Marseille, Amy E, MD   2 mg at 04/29/21 0945  . traZODone (DESYREL) tablet 50 mg  50 mg Oral QHS PRN Sharma Covert, MD   50 mg at 04/28/21 2020  . Vitamin D (Ergocalciferol) (DRISDOL) capsule 50,000 Units  50,000 Units Oral Q7 days Harlow Asa, MD  50,000 Units at 04/26/21 4332    Lab Results: No results found for this or any previous visit (from the past 48 hour(s)).  Blood Alcohol level:  Lab Results  Component Value Date   ETH <10 04/22/2021   ETH <10 95/18/8416    Metabolic Disorder Labs: Lab Results  Component Value Date   HGBA1C 5.1 11/02/2020   MPG 99.67 11/02/2020   MPG 85.32 05/30/2018   Lab Results  Component Value Date   PROLACTIN 32.5 (H) 05/30/2018   Lab Results  Component Value Date   CHOL 148 11/02/2020   TRIG 47 11/02/2020   HDL 71 11/02/2020   CHOLHDL 2.1 11/02/2020   VLDL 9 11/02/2020   LDLCALC 68 11/02/2020   LDLCALC 107 (H) 05/30/2018   Musculoskeletal: Strength & Muscle Tone: within normal limits Gait & Station: unassessed, in bed Patient leans: N/A  Psychiatric Specialty Exam: Physical Exam Vitals and nursing note reviewed.  HENT:     Head: Normocephalic.  Pulmonary:     Effort: Pulmonary effort is normal.  Musculoskeletal:        General: Normal range of motion.     Cervical back: Normal range of motion.  Neurological:     Mental Status: He is alert and oriented to person, place, and time.  Psychiatric:        Attention and Perception: He does not perceive auditory or visual hallucinations.        Mood and Affect: Mood is depressed.        Speech: Speech normal.        Behavior: Behavior is cooperative.         Thought Content: Thought content is not paranoid or delusional. Thought content does not include homicidal or suicidal ideation. Thought content does not include homicidal or suicidal plan.     Review of Systems  Constitutional: Negative.   HENT: Negative for congestion and sore throat.   Respiratory: Negative for shortness of breath.   Cardiovascular: Negative for chest pain.  Gastrointestinal: Negative for diarrhea, nausea and vomiting.  Genitourinary: Negative.   Musculoskeletal: Negative.   Neurological: Negative.     Blood pressure 119/76, pulse 100, temperature 97.9 F (36.6 C), temperature source Oral, resp. rate 18, height 5' 8.11" (1.73 m), weight 67.1 kg, SpO2 100 %.Body mass index is 22.43 kg/m.  General Appearance: casually dressed, fair hygiene, appears stated age  Eye Contact:  Fair  Speech:  minimal and mumbling quality, occasional speech latency  Volume:  Decreased  Mood:  aloof  Affect:  Constricted and guarded  Thought Process:  Goal Directed  Orientation:  Oriented to self, year, month and city  Thought Content: Denies AVH but endorses belief in thought withdrawal; appears paranoid and guarded on exam; denies ideas of reference  Suicidal Thoughts:  No  Homicidal Thoughts:  No  Memory:  Recent;   Fair  Judgement:  Fair  Insight:  Lacking  Psychomotor Activity:  Normal  Concentration:  Concentration: Fair  Recall:  [employer] of Knowledge:  Fair  Language:  Fair  Akathisia:  Negative  Assets:  Communication Skills Desire for Improvement Resilience  ADL's:  Intact  Cognition:  WNL  Sleep:  Number of Hours: 5.75   Treatment Plan Summary: Diagnoses / Active Problems: Schizoaffective d/o bipolar type by hx (r/o schizophrenia) Cannabis use d/o Stimulant use d/o - cocaine type Vitamin D deficiency  PLAN: 1. Safety and Monitoring:  -- Voluntary admission to inpatient psychiatric unit for safety, stabilization and treatment  --  Daily contact with patient  to assess and evaluate symptoms and progress in treatment  -- Patient's case to be discussed in multi-disciplinary team meeting  -- Observation Level : q15 minute checks  -- Vital signs:  q12 hours  -- Precautions: suicide  2. Psychiatric Diagnoses and Treatment:  Schizoaffective d/o by hx (r/o schizophrenia) -- Continue Cogentin 0.5mg  bid PRN EPS/tremors -- Increase Risperdal M-tabs 2mg  po bid for residual psychosis as bridging dose while LAI reaches steady state -- Received Invega sustenna 234mg  IM on 04/25/21. --Received Kirt Boys 156mg  IM on 04/29/21 -- Continue Neurontin 400mg  tid for anxiety and mood stability -- Continue Trazodone 50mg  po qhs PRN Insomnia  -- Continue Vistaril 25mg  tid PRN anxiety -- Continue Risperdal agitation protocol PRN  -- Metabolic profile and EKG monitoring obtained while on an atypical antipsychotic (BMI:22.43; Lipid Panel: cholesterol 181, triglycerides 90, HDL 54, LDL 109;HbgA1c:5.2 QTc:376ms)   -- Encouraged patient to participate in unit milieu and in scheduled group therapies   -- Short Term Goals: Ability to demonstrate self-control will improve and Ability to identify and develop effective coping behaviors will improve  -- Long Term Goals: Improvement in symptoms so as ready for discharge   Cannabis use d/o  Stimulant use d/o - cocaine type  -- UDS positive for THC and cocaine  -- Patient counseled on the need to abstain from illicit substances after discharge  -- Short Term Goals: Ability to identify triggers associated with substance abuse/mental health issues will improve  -- Long Term Goals: Improvement in symptoms so as ready for discharge   3. Medical Issues Being Addressed:   Vitamin D deficiency   -- Start Vitamin D 50,000IU weekly. First dose given on 04/26/2021  4. Discharge Planning:   -- Social work and case management to assist with discharge planning and identification of hospital follow-up needs prior to discharge  --  Estimated LOS: 3-4 days  -- Discharge Concerns: Need to establish a safety plan; Medication compliance and effectiveness  -- Discharge Goals: Return home with outpatient referrals for mental health follow-up including medication management/psychotherapy  Ethelene Hal, NP 04/29/2021, 1:02 PM

## 2021-04-29 NOTE — [facility] Group Notes (Signed)
Adult Psychoeducational Group Not Date:  04/29/2021 Time:  0900-1045 Group Topic/Focus: PROGRESSIVE RELAXATION. A group where deep breathing is taught and tensing and relaxation muscle groups is used. Imagery is used as well.  Pts are asked to imagine 3 pillars that hold them up when they are not able to hold themselves up.  Participation Level:  Did not attend   Paulino Rily

## 2021-04-29 NOTE — Progress Notes (Addendum)
   Nursing Note: 0700-1900  D:  Pt calm and cooperative today, received his second dose of Invega without problem. Reports that he slept "ok" last night, appetite is good and is he is tolerating prescribed medication.   Pt flat and withdrawn this am with some thought blocking observed, he did brighten some during shift and interacted positively in the milieu. He verbalized concern over discharge planning, where he might stay. "I will get by, it will take a little bit, but I will find a place."  A:  Encouraged to verbalize needs and concerns, active listening and support provided.  Continued Q 15 minute safety checks.  Observed active participation in group settings.  R:  Pt. is calm,pleasant and cooperative.  Denies A/V hallucinations and is able to verbally contract for safety.              04/29/21 0800  Psych Admission Type (Psych Patients Only)  Admission Status Voluntary  Psychosocial Assessment  Patient Complaints None  Eye Contact Brief  Facial Expression Flat  Affect Anxious;Preoccupied  Speech Soft;Slow  Interaction Forwards little;Cautious  Motor Activity Other (Comment) (Steady, WNL)  Appearance/Hygiene Unremarkable  Behavior Characteristics Cooperative;Appropriate to situation  Mood Depressed;Preoccupied  Thought Process  Coherency Blocking  Content Preoccupation;Paranoia  Delusions Paranoid  Perception WDL  Hallucination None reported or observed  Judgment Poor  Confusion None  Danger to Self  Current suicidal ideation? Denies  Self-Injurious Behavior No self-injurious ideation or behavior indicators observed or expressed   Danger to Others  Danger to Others None reported or observed  [place] NOVEL CORONAVIRUS (COVID-19) DAILY CHECK-OFF SYMPTOMS - answer yes or no to each - every day NO YES  Have you had a fever in the past 24 hours?  . Fever (Temp > 37.80C / 100F) X   Have you had any of these symptoms in the past 24 hours? . New Cough .  Sore  Throat  .  Shortness of Breath .  Difficulty Breathing .  Unexplained Body Aches   X   Have you had any one of these symptoms in the past 24 hours not related to allergies?   . Runny Nose .  Nasal Congestion .  Sneezing   X   If you have had runny nose, nasal congestion, sneezing in the past 24 hours, has it worsened?  X   EXPOSURES - check yes or no X   Have you traveled outside the state in the past 14 days?  X   Have you been in contact with someone with a confirmed diagnosis of COVID-19 or PUI in the past 14 days without wearing appropriate PPE?  X   Have you been living in the same home as a person with confirmed diagnosis of COVID-19 or a PUI (household contact)?    X   Have you been diagnosed with COVID-19?    X              What to do next: Answered NO to all: Answered YES to anything:   Proceed with unit schedule Follow the [facility] Inpatient Flowsheet.

## 2021-04-29 NOTE — Progress Notes (Signed)
Adult Psychoeducational Group Note  Date:  04/29/2021 Time:  10:42 PM  Group Topic/Focus:  Wrap-Up Group:   The focus of this group is to help patients review their daily goal of treatment and discuss progress on daily workbooks.  Participation Level:  Minimal  Participation Quality:  Appropriate  Affect:  Anxious  Cognitive:  Disorganized and Lacking  Insight: Lacking and Limited  Engagement in Group:  Limited and Poor  Modes of Intervention:  Discussion  Additional Comments:  Pt stated his goal for today was to focus on his treatment plan. Pt stated he accomplished his goal today. Pt stated he did not get a chance to talked with his doctor or his social worker about his care today. Pt rated his overall day a 9 out of10. Pt he made no calls today.Pt stated he felt better about himself today.Pt stated he was able to attend all groups held today.Pt stated he was able to attend all meals. Pt stated he took all medications provided today. Pt stated his appetite was fair today. Pt rated sleep last night was fair. Pt stated the goal tonight was to get some rest. Pt stated he had no physical pain tonight. Pt admitted to experiencing some auditory issues tonight. Pt nurse was updated on situation.  Pt deny visual hallucinations  issues tonight.Pt denies thoughts of harming himself or others. Pt stated he would alert staff if anything changed.  Candy Sledge 04/29/2021, 10:42 PM

## 2021-04-30 MED ORDER — GABAPENTIN 400 MG PO CAPS: 400.0000 mg | ORAL_CAPSULE | Freq: Three times a day (TID) | ORAL | 0 refills | Status: DC

## 2021-04-30 MED ORDER — INVEGA SUSTENNA 156 MG/ML IM SUSY: 156.0000 mg | PREFILLED_SYRINGE | Freq: Once | INTRAMUSCULAR | 0 refills | Status: DC

## 2021-04-30 MED ORDER — RISPERIDONE 2 MG PO TBDP: 2.0000 mg | ORAL_TABLET | Freq: Every day | ORAL | 0 refills | Status: DC

## 2021-04-30 MED ORDER — BENZTROPINE MESYLATE 0.5 MG PO TABS: 0.5000 mg | ORAL_TABLET | Freq: Two times a day (BID) | ORAL | 0 refills | Status: DC | PRN

## 2021-04-30 MED ORDER — VITAMIN D (ERGOCALCIFEROL) 1.25 MG (50000 UNIT) PO CAPS: 50000.0000 [IU] | ORAL_CAPSULE | ORAL | 0 refills | Status: DC

## 2021-04-30 MED ORDER — HYDROXYZINE HCL 25 MG PO TABS: 25.0000 mg | ORAL_TABLET | Freq: Three times a day (TID) | ORAL | 0 refills | Status: DC | PRN

## 2021-04-30 MED ORDER — TRAZODONE HCL 50 MG PO TABS: 50.0000 mg | ORAL_TABLET | Freq: Every evening | ORAL | 0 refills | Status: DC | PRN

## 2021-04-30 NOTE — Progress Notes (Signed)
  [facility] Adult Case Management Discharge Plan :  Will you be returning to the same living situation after discharge:  No. Will be discharged to the [facility] At discharge, do you have transportation home?: No. Safe Transport will be arranged Do you have the ability to pay for your medications: Yes,  has insurance  Release of information consent forms completed and in the chart;  Patient's signature needed at discharge.  Patient to Follow up at:  [facility] on 05/02/2021.   Specialty: Behavioral Health Why: You have an appointment on 05/02/21 at 7:45 am for therapy services.  You also have an appointment on 05/28/21 at 7:45 am for medication management.  These appointments will be held in person and are first come, first served. Contact information: [facility] up.   Why: A referral has been made on your behalf for [employer] program to assist in appyling for disability and SSI benefits.  Contact information: [address] [place], [place] [address]    Telephone: [phone] Fax: [phone]              Next level of care provider has access to [place] and Suicide Prevention discussed: Yes,  with patient     Has patient been referred to the Quitline?: N/A patient is not a smoker  Patient has been referred for addiction treatment: Pt. refused referral  Vassie Moselle, [place] 04/30/2021, 10:15 AM

## 2021-04-30 NOTE — Tx Team (Signed)
Interdisciplinary Treatment and Diagnostic Plan Update  04/30/2021 Time of Session: 9:40am Tyler White MRN: [id number]  Principal Diagnosis: Schizoaffective disorder, bipolar type ([place])  Secondary Diagnoses: Principal Problem:   Schizoaffective disorder, bipolar type ([place]) Active Problems:   Cocaine abuse ([place])   Marijuana abuse   Current Medications:  Current Facility-Administered Medications  Medication Dose Route Frequency Provider Last Rate Last Admin  . acetaminophen (TYLENOL) tablet 650 mg  650 mg Oral Q6H PRN Sharma Covert, MD   650 mg at 04/27/21 1938  . alum & mag hydroxide-simeth (MAALOX/MYLANTA) 200-200-20 MG/5ML suspension 30 mL  30 mL Oral Q4H PRN Sharma Covert, MD      . benztropine (COGENTIN) tablet 0.5 mg  0.5 mg Oral BID PRN Sharma Covert, MD      . gabapentin (NEURONTIN) capsule 400 mg  400 mg Oral TID Sharma Covert, MD   400 mg at 04/30/21 8527  . hydrOXYzine (ATARAX/VISTARIL) tablet 25 mg  25 mg Oral TID PRN Sharma Covert, MD   25 mg at 04/29/21 2057  . risperiDONE (RISPERDAL M-TABS) disintegrating tablet 2 mg  2 mg Oral Q8H PRN Sharma Covert, MD       And  . LORazepam (ATIVAN) tablet 1 mg  1 mg Oral Q6H PRN Sharma Covert, MD       And  . ziprasidone (GEODON) injection 20 mg  20 mg Intramuscular Q6H PRN Sharma Covert, MD      . magnesium hydroxide (MILK OF MAGNESIA) suspension 30 mL  30 mL Oral Daily PRN Sharma Covert, MD      . risperiDONE (RISPERDAL M-TABS) disintegrating tablet 2 mg  2 mg Oral QHS Sharma Covert, MD   2 mg at 04/29/21 2055  . risperiDONE (RISPERDAL M-TABS) disintegrating tablet 2 mg  2 mg Oral Daily Viann Fish E, MD   2 mg at 04/30/21 7824  . traZODone (DESYREL) tablet 50 mg  50 mg Oral QHS PRN Sharma Covert, MD   50 mg at 04/29/21 2057  . Vitamin D (Ergocalciferol) (DRISDOL) capsule 50,000 Units  50,000 Units Oral Q7 days Harlow Asa, MD   50,000 Units at 04/26/21 2353   PTA  Medications: Medications Prior to Admission  Medication Sig Dispense Refill Last Dose  . risperiDONE (RISPERDAL M-TABS) 2 MG disintegrating tablet Take 1 tablet (2 mg total) by mouth daily. (Patient not taking: Reported on 04/12/2021) 30 tablet 0     Patient Stressors: Financial difficulties Other: lack of housing  Patient Strengths: Ability for insight Communication skills Physical Health  Treatment Modalities: Medication Management, Group therapy, Case management,  1 to 1 session with clinician, Psychoeducation, Recreational therapy.   Physician Treatment Plan for Primary Diagnosis: Schizoaffective disorder, bipolar type ([place]) Long Term Goal(s): Improvement in symptoms so as ready for discharge Improvement in symptoms so as ready for discharge   Short Term Goals: Ability to demonstrate self-control will improve Ability to identify and develop effective coping behaviors will improve Ability to identify triggers associated with substance abuse/mental health issues will improve  Medication Management: Evaluate patient's response, side effects, and tolerance of medication regimen.  Therapeutic Interventions: 1 to 1 sessions, Unit Group sessions and Medication administration.  Evaluation of Outcomes: Adequate for Discharge  Physician Treatment Plan for Secondary Diagnosis: Principal Problem:   Schizoaffective disorder, bipolar type ([place]) Active Problems:   Cocaine abuse ([place])   Marijuana abuse  Long Term Goal(s): Improvement in symptoms so as ready for discharge Improvement  in symptoms so as ready for discharge   Short Term Goals: Ability to demonstrate self-control will improve Ability to identify and develop effective coping behaviors will improve Ability to identify triggers associated with substance abuse/mental health issues will improve     Medication Management: Evaluate patient's response, side effects, and tolerance of medication regimen.  Therapeutic Interventions:  1 to 1 sessions, Unit Group sessions and Medication administration.  Evaluation of Outcomes: Adequate for Discharge   RN Treatment Plan for Primary Diagnosis: Schizoaffective disorder, bipolar type ([place]) Long Term Goal(s): Knowledge of disease and therapeutic regimen to maintain health will improve  Short Term Goals: Ability to remain free from injury will improve, Ability to participate in decision making will improve, Ability to verbalize feelings will improve, Ability to disclose and discuss suicidal ideas and Ability to identify and develop effective coping behaviors will improve  Medication Management: RN will administer medications as ordered by provider, will assess and evaluate patient's response and provide education to patient for prescribed medication. RN will report any adverse and/or side effects to prescribing provider.  Therapeutic Interventions: 1 on 1 counseling sessions, Psychoeducation, Medication administration, Evaluate responses to treatment, Monitor vital signs and CBGs as ordered, Perform/monitor CIWA, COWS, AIMS and Fall Risk screenings as ordered, Perform wound care treatments as ordered.  Evaluation of Outcomes: Adequate for Discharge   LCSW Treatment Plan for Primary Diagnosis: Schizoaffective disorder, bipolar type ([place]) Long Term Goal(s): Safe transition to appropriate next level of care at discharge, Engage patient in therapeutic group addressing interpersonal concerns.  Short Term Goals: Engage patient in aftercare planning with referrals and resources, Increase social support, Increase emotional regulation, Facilitate acceptance of mental health diagnosis and concerns, Identify triggers associated with mental health/substance abuse issues and Increase skills for wellness and recovery  Therapeutic Interventions: Assess for all discharge needs, 1 to 1 time with Social worker, Explore available resources and support systems, Assess for adequacy in community support  network, Educate family and significant other(s) on suicide prevention, Complete Psychosocial Assessment, Interpersonal group therapy.  Evaluation of Outcomes: Adequate for Discharge   Progress in Treatment: Attending groups: No. Participating in groups: No. Taking medication as prescribed: Yes. Toleration medication: Yes. Family/Significant other contact made: No, will contact:  [place]  Patient understands diagnosis: No. Discussing patient identified problems/goals with staff: Yes. Medical problems stabilized or resolved: Yes. Denies suicidal/homicidal ideation: Yes. Issues/concerns per patient self-inventory: No.   New problem(s) identified: No, Describe:  None   New Short Term/Long Term Goal(s): medication stabilization, elimination of SI thoughts, development of comprehensive mental wellness plan.   Patient Goals: "To get back on my medications"  Discharge Plan or Barriers: Patient is to follow up at [facility] for therapy and medication management. Patient is currently homeless and will be discharged to shelter.  Reason for Continuation of Hospitalization: Medication stabilization  Estimated Length of Stay: Adequate for discharge  Attendees: Patient:  04/25/2021   Physician:  04/25/2021   Nursing:  04/25/2021   RN Care Manager: 04/25/2021   Social Worker: Darletta Moll, LCSW 04/25/2021   Recreational Therapist:  04/25/2021   Other:  04/25/2021   Other:  04/25/2021   Other: 04/25/2021     Scribe for Treatment Team: Vassie Moselle, LCSW 04/30/2021 10:26 AM

## 2021-04-30 NOTE — Discharge Summary (Signed)
Physician Discharge Summary Note  Patient:  Tyler White is an 25 y.o., male MRN:  [id number] DOB:  [birth year] Patient phone:  [phone] (home)  Patient address:   [place] [address],  Total Time spent with patient: 30 minutes  Date of Admission:  04/23/2021 Date of Discharge: 04/30/2021  Reason for Admission:  (From MD's admission note): Patient is a 25 year old male with a past psychiatric history significant for schizophrenia versus schizoaffective disorder who originally presented to the [facility] emergency department on 04/20/2021 with suicidal ideation. He stated he had a plan to overdose on pills. He also endorsed auditory as well as visual hallucinations. Patient is well-known to our service from multiple psychiatric hospitalizations in the past. He is a poor historian. He does not provide a great deal of answers. Patient admitted noncompliance with his psychiatric medications. He stated at least "weeks". He appeared to be thought blocking on the evaluation of social work, and admitted to feeling helpless, hopeless and worthless. He denied any plan. He does have chronic homelessness for the last 3 years. His last psychiatric hospitalization in the electronic medical record was on 01/15/2021. Diagnosis at that time was cocaine abuse. He was discharged from the [place] at [facility] on Risperdal 1 mg p.o. nightly. As stated above his last hospitalization in our facility was on 11/19/2020. His discharge medications at that time included Cogentin, BuSpar, Depakote, gabapentin, hydroxyzine, the long-acting paliperidone injection and oral Risperdal. On his admission orders last night he was restarted on Risperdal 1 mg p.o. daily and 2 mg p.o. nightly. He was also placed on Neurontin 300 mg p.o. 3 times daily for mood stability. He was admitted to the hospital for evaluation and stabilization.  Evaluation on the unit today: patient was seen and  evaluated. He denies SI/HI/AVH, paranoia and delusions. He denies thought insertion, deletion or manipulation. He is taking his medications and has no issues with them. His medications were explained to him in detail. He agreed to stay on them after discharge, however he is frequently non-compliant with his medications. He has poor judgement and lacks insight and appears to be at baseline for his understanding and willingness to comply with his treatment regimen. He has been hospitalized multiple times in various hospitals and ED's in [place], mostly in the [place], [place] or [place] area. He is homeless. He was started on [place] LAI and received the loading dose of 234 mg on 04/25/2021 and he received his second dose of Invega Sustenna 156 mg IM LAI on 04/29/2021. His next dose will be due on 05/27/2021 and every 28 days after that. He was given paper prescriptions for his medications. He will remain on Risperdal oral tablets to bridge until the Invega LAI is therapeutic. He was provided with paper prescriptions to take with him to the [facility] where they can assist him with getting his medications filled. He was also provided with 7 days worth of samples of his medications. He is calm and cooperative. He reported good sleep and a good appetite. Patient is stable for discharge today.   Principal Problem: Schizoaffective disorder, bipolar type [facility]) Discharge Diagnoses: Principal Problem:   Schizoaffective disorder, bipolar type ([place]) Active Problems:   Cocaine abuse ([place])   Marijuana abuse  Past Psychiatric History: See H&P  Past Medical History:  Past Medical History:  Diagnosis Date  . Hernia, inguinal, right   . Inguinal hernia    right  . Psychiatric illness   . Schizophrenia ([place])  History reviewed. No pertinent surgical history. Family History:  Family History  Problem Relation Age of Onset  . Psychiatric Illness Mother   . Hypertension Sister    Family Psychiatric  History: See  H&P Social History:  Social History   Substance and Sexual Activity  Alcohol Use Yes     Social History   Substance and Sexual Activity  Drug Use Yes  . Types: Marijuana    Social History   Socioeconomic History  . Marital status: Single    Spouse name: Not on file  . Number of children: Not on file  . Years of education: 60  . Highest education level: Not on file  Occupational History  . Not on file  Tobacco Use  . Smoking status: Current Every Day Smoker    Packs/day: 0.50    Years: 5.00    Pack years: 2.50    Types: Cigarettes  . Smokeless tobacco: Never Used  Vaping Use  . Vaping Use: Never used  Substance and Sexual Activity  . Alcohol use: Yes  . Drug use: Yes    Types: Marijuana  . Sexual activity: Yes    Birth control/protection: None  Other Topics Concern  . Not on file  Social History Narrative   ** Merged History Encounter **       ** Merged History Encounter **       Social Determinants of Health   Financial Resource Strain: Not on file  Food Insecurity: Not on file  Transportation Needs: Not on file  Physical Activity: Not on file  Stress: Not on file  Social Connections: Not on file    Hospital Course:  After the above admission evaluation, Aadil's presenting symptoms were noted. He was recommended for mood stabilization treatments. The medication regimen targeting those presenting symptoms were discussed with him & initiated with his consent. He was restarted on his home medications. He was started on [place] LAI and received the loading dose of 234 mg on 04/25/2021 and he received his second dose of Invega Sustenna 156 mg IM LAI on 04/29/2021. His next dose will be due on 05/27/2021 and every 28 days after that. He was given paper prescriptions for his medications. He will remain on Risperdal oral tablets to bridge until the Invega LAI is therapeutic.  His UDS on arrival to the ED was positive for cocaine and THC, BAL was negative. He had  no cocaine withdrawal issues.  He was medicated, stabilized & discharged on the medications as listed on his discharge medication lists below. Besides the mood stabilization treatments, Izzac was also enrolled & participated in the group counseling sessions being offered & held on this unit. He learned coping skills. He presented no other significant pre-existing medical issues that required treatment. He tolerated his treatment regimen without any adverse effects or reactions reported.   During the course of his hospitalization, the 15-minute checks were adequate to ensure patient's safety. Victorio did not display any dangerous, violent or suicidal behavior on the unit.  He interacted with patients & staff appropriately, participated appropriately in the group sessions/therapies. His medications were addressed & adjusted to meet his needs. He was recommended for outpatient follow-up care & medication management upon discharge to assure continuity of care & mood stability.  At the time of discharge patient is not reporting any acute suicidal/homicidal ideations. He feels more confident about his self-care & in managing his mental health. He currently denies any new issues or concerns. Education and supportive counseling  provided throughout his hospital stay & upon discharge.   Today upon his discharge evaluation with the attending psychiatrist, Gottfried shares he is doing well. He denies any other specific concerns. He is sleeping well. His appetite is good. He denies other physical complaints. He denies AH/VH, delusional thoughts or paranoia. He does not appear to be responding to any internal stimuli. He feels that his medications have been helpful & is in agreement to continue his current treatment regimen as recommended. He was able to engage in safety planning including plan to return to [facility] or contact emergency services if he feels unable to maintain his own safety or the safety of others. Pt had no  further questions, comments, or concerns. He left [facility] with all personal belongings in no apparent distress. Transportation per [employer] to [employer].   Physical Findings: AIMS: Facial and Oral Movements Muscles of Facial Expression: None, normal Lips and Perioral Area: None, normal Jaw: None, normal Tongue: None, normal,Extremity Movements Upper (arms, wrists, hands, fingers): None, normal Lower (legs, knees, ankles, toes): None, normal, Trunk Movements Neck, shoulders, hips: None, normal, Overall Severity Severity of abnormal movements (highest score from questions above): None, normal Incapacitation due to abnormal movements: None, normal Patient's awareness of abnormal movements (rate only patient's report): No Awareness, Dental Status Current problems with teeth and/or dentures?: No Does patient usually wear dentures?: No  CIWA:    COWS:     Musculoskeletal: Strength & Muscle Tone: within normal limits Gait & Station: normal Patient leans: N/A  Psychiatric Specialty Exam:  Presentation  General Appearance: Disheveled; Appropriate for Environment; Casual  Eye Contact:Good  Speech:Normal Rate  Speech Volume:Decreased  Handedness:Right  Mood and Affect  Mood:Euthymic  Affect:Flat  Thought Process  Thought Processes:Goal Directed  Descriptions of Associations:Intact  Orientation:Full (Time, Place and Person)  Thought Content:Logical  History of Schizophrenia/Schizoaffective disorder:Yes  Duration of Psychotic Symptoms:Greater than six months  Hallucinations:No data recorded Ideas of Reference:None  Suicidal Thoughts:No data recorded Homicidal Thoughts:No data recorded  Sensorium  Memory:Remote Poor; Immediate Fair; Recent Fair  Judgment:Poor (Patient is at baseline)  Insight:Lacking (Patient's baseline insight is lacking)  Executive Functions  Concentration:Fair  Attention Span:Fair  [place]  Psychomotor Activity  Psychomotor Activity:No data recorded  Assets  Assets:Desire for Improvement; Resilience; Social Support; Catering manager; Physical Health; Communication Skills  Sleep  Sleep:No data recorded  Physical Exam: Physical Exam Vitals and nursing note reviewed.  HENT:     Head: Normocephalic.  Musculoskeletal:        General: Normal range of motion.     Cervical back: Normal range of motion.  Neurological:     Mental Status: He is alert and oriented to person, place, and time.  Psychiatric:        Attention and Perception: Attention normal. He does not perceive auditory or visual hallucinations.        Mood and Affect: Mood normal.        Speech: Speech normal.        Behavior: Behavior normal. Behavior is cooperative.        Thought Content: Thought content normal. Thought content is not paranoid or delusional. Thought content does not include homicidal or suicidal ideation. Thought content does not include homicidal or suicidal plan.        Cognition and Memory: Cognition normal.    Review of Systems  Constitutional: Negative for fever.  HENT: Negative for congestion and sore throat.   Respiratory: Negative for  cough and shortness of breath.   Cardiovascular: Negative for chest pain.  Gastrointestinal: Negative.   Genitourinary: Negative.   Musculoskeletal: Negative.   Neurological: Negative.    Blood pressure 123/65, pulse 95, temperature 97.7 F (36.5 C), temperature source Oral, resp. rate 18, height 5' 8.11" (1.73 m), weight 67.1 kg, SpO2 100 %. Body mass index is 22.43 kg/m.  Has this patient used any form of tobacco in the last 30 days? (Cigarettes, Smokeless Tobacco, Cigars, and/or Pipes) Yes, Yes, A prescription for an FDA-approved tobacco cessation medication was offered at discharge and the patient refused  Blood Alcohol level:  Lab Results  Component Value Date   [facility] <10 04/22/2021   ETH <10  [id number]    Metabolic Disorder Labs:  Lab Results  Component Value Date   HGBA1C 5.1 11/02/2020   MPG 99.67 11/02/2020   MPG 85.32 05/30/2018   Lab Results  Component Value Date   PROLACTIN 32.5 (H) 05/30/2018   Lab Results  Component Value Date   CHOL 148 11/02/2020   TRIG 47 11/02/2020   HDL 71 11/02/2020   CHOLHDL 2.1 11/02/2020   VLDL 9 11/02/2020   LDLCALC 68 11/02/2020   LDLCALC 107 (H) 05/30/2018    See Psychiatric Specialty Exam and Suicide Risk Assessment completed by Attending Physician prior to discharge.  Discharge destination:  Other:  [facility]  Is patient on multiple antipsychotic therapies at discharge:  Yes,   Do you recommend tapering to monotherapy for antipsychotics?  No   Has Patient had three or more failed trials of antipsychotic monotherapy by history:  No  Recommended Plan for Multiple Antipsychotic Therapies: NA  Discharge Instructions    Diet - low sodium heart healthy   Complete by: As directed    Increase activity slowly   Complete by: As directed      Allergies as of 04/30/2021   No Known Allergies     Medication List    TAKE these medications     Indication  benztropine 0.5 MG tablet Commonly known as: COGENTIN Take 1 tablet (0.5 mg total) by mouth 2 (two) times daily as needed for tremors.  Indication: Extrapyramidal Reaction caused by Medications   gabapentin 400 MG capsule Commonly known as: NEURONTIN Take 1 capsule (400 mg total) by mouth 3 (three) times daily.  Indication: anxiety & mood stability   hydrOXYzine 25 MG tablet Commonly known as: ATARAX/VISTARIL Take 1 tablet (25 mg total) by mouth 3 (three) times daily as needed for anxiety.  Indication: Feeling Anxious   Invega Sustenna 156 MG/ML Susy injection Generic drug: paliperidone Inject 1 mL (156 mg total) into the muscle once for 1 dose. Next dose is due 05/27/2021  Indication: Schizophrenia   risperiDONE 2 MG disintegrating tablet Commonly known as: RISPERDAL  M-TABS Take 1 tablet (2 mg total) by mouth at bedtime. What changed: when to take this  Indication: Schizophrenia   traZODone 50 MG tablet Commonly known as: DESYREL Take 1 tablet (50 mg total) by mouth at bedtime as needed for sleep.  Indication: Trouble Sleeping   Vitamin D (Ergocalciferol) 1.25 MG (50000 UNIT) Caps capsule Commonly known as: DRISDOL Take 1 capsule (50,000 Units total) by mouth every 7 (seven) days. Next dose due on 05/02/2021 Start taking on: May 02, 2021  Indication: Vitamin D Deficiency       Follow-up [place]. Go on 05/02/2021.   Specialty: Behavioral Health Why: You have an appointment on 05/02/21 at  7:45 am for therapy services.  You also have an appointment on 05/28/21 at 7:45 am for medication management.  These appointments will be held in person and are first come, first served. Contact information: [place] Follow up.   Why: A referral has been made on your behalf for [employer] program to assist in appyling for disability and SSI benefits.  Contact information: [address] [place], [place] [address]    Telephone: [phone] Fax: [phone]              Follow-up recommendations:  Activity:  as tolerated Diet:  Heart healthy  Comments:  Prescriptions given at discharge.  Patient agreeable to plan.  Given opportunity to ask questions.  Appears to feel comfortable with discharge denies any current suicidal or homicidal thoughts.   Patient is instructed prior to discharge to: Take all medications as prescribed by his mental healthcare provider. Report any adverse effects and or reactions from the medicines to his outpatient provider promptly. Patient has been instructed & cautioned: To not engage in alcohol and or illegal drug use while on prescription medicines. In the event of worsening symptoms, patient is instructed to call  the crisis hotline, 911 and or go to the nearest ED for appropriate evaluation and treatment of symptoms. To follow-up with his primary care provider for your other medical issues, concerns and or health care needs.  Signed: Ethelene Hal, NP 04/30/2021, 11:11 AM

## 2021-04-30 NOTE — Plan of Care (Signed)
Pt was able to communicate with staff when prompted in calm manner at completion of recreation therapy group sessions.    Victorino Sparrow, LRT/CTRS

## 2021-04-30 NOTE — Progress Notes (Signed)
Pt discharged to lobby. Pt was stable and appreciative at that time. All papers and prescriptions were given and valuables returned. Verbal understanding expressed. Denies SI/HI and A/VH. Pt given opportunity to express concerns and ask questions.  

## 2021-04-30 NOTE — Progress Notes (Signed)
Recreation Therapy Notes  Date: 5.2.22 Time: 1020 Location: [address] Dayroom  Group Topic: Coping Skills  Goal Area(s) Addresses:  Patient will identify positive coping skills. Patient will identify benefit of using positive coping skills.  Behavioral Response: Minimal  Intervention: Mind Map, Pencils  Activity: Mind Map.  LRT and patients filled out the first eight boxes (jail, freedom, depression, being in a mental hospital, anxiety, trust, respect and hopelessness) together with instances in which coping skills would be used.  Pt would then get time to come up with coping skills for each instance identified.  Once pt finished, group would come back together and LRT would right coping skills on the board.  Pt would be able to fill in any blank spots on their paper if needed.  Education: Radiographer, therapeutic, Dentist.   Education Outcome: Acknowledges understanding/In group clarification offered/Needs additional education.   Clinical Observations/Feedback: Pt was attentive but continued to rock back and forth in his chair.  Pt identified some coping skills as reaching out to family, the way you speak to people, taking time to relax, forgiveness and communication.   Victorino Sparrow, LRT/CTRS     Victorino Sparrow A 04/30/2021 11:26 AM

## 2021-04-30 NOTE — Progress Notes (Signed)
Recreation Therapy Notes  INPATIENT RECREATION TR PLAN  Patient Details Name: Tyler White MRN: [id number] DOB: 03-20-1996 Today's Date: 04/30/2021  Rec Therapy Plan Is patient appropriate for Therapeutic Recreation?: Yes Treatment times per week: about 3 days Estimated Length of Stay: 5-7 days TR Treatment/Interventions: Group participation (Comment)  Discharge Criteria Pt will be discharged from therapy if:: Discharged Treatment plan/goals/alternatives discussed and agreed upon by:: Patient/family  Discharge Summary Short term goals set: See patient care plan Short term goals met: Adequate for discharge Progress toward goals comments: Groups attended Which groups?: Self-esteem,Coping skills Reason goals not met: Pt was social with prompted Therapeutic equipment acquired: N/A Reason patient discharged from therapy: Discharge from hospital Pt/family agrees with progress & goals achieved: Yes Date patient discharged from therapy: 04/30/21   Victorino Sparrow, LRT/CTRS  Ria Comment, [place] 04/30/2021, 11:55 AM

## 2021-04-30 NOTE — Plan of Care (Signed)
Patient was guarded and moslty isolative. Went to bed early after HS medications and has been sleeping.

## 2021-04-30 NOTE — BHH Suicide Risk Assessment (Signed)
[facility] Discharge Suicide Risk Assessment   Principal Problem: Schizoaffective disorder, bipolar type [facility]) Discharge Diagnoses: Principal Problem:   Schizoaffective disorder, bipolar type ([place]) Active Problems:   Cocaine abuse ([place])   Marijuana abuse  Total Time Spent in Direct Patient Care:  I personally spent 30 minutes on the unit in direct patient care. The direct patient care time included face-to-face time with the patient, reviewing the patient's chart, communicating with other professionals, and coordinating care. Greater than 50% of this time was spent in counseling or coordinating care with the patient regarding goals of hospitalization, psycho-education, and discharge planning needs.  Subjective: Patient seen on rounds in the presence of APP. He states he is sleeping and eating "fine" and voices no physical complaints. He denies SI or HI and denies AVH, ideas of reference, or first rank symptoms. He denies medication side-effects and time was spent encouraging him to comply with oral Risperdal while his LAI becomes therapeutic. It was explained that once his LAI reaches steady state that his oral Risperdal can be discontinued. He was also encouraged to continue monthly LAI for help with medication compliance after discharge. He was advised he will need ongoing metabolic lab, EKG, weight, CBC, and AIMS monitoring while on an antipsychotic. Time was given for questions. It was stressed to him to abstain from alcohol and illicit substances after discharge. He states he will work with the [facility] after discharge on housing needs.   EKG 04/30/21: NSR 74bpm with early repolarization and QTc 356ms  Musculoskeletal: Strength & Muscle Tone: within normal limits Gait & Station: normal, steady Patient leans: N/A  Psychiatric Specialty Exam: Review of Systems  Respiratory: Negative for shortness of breath.   Cardiovascular: Negative for chest pain.  Gastrointestinal: Negative for diarrhea, nausea and  vomiting.    Blood pressure 123/65, pulse 95, temperature 97.7 F (36.5 C), temperature source Oral, resp. rate 18, height 5' 8.11" (1.73 m), weight 67.1 kg, SpO2 100 %.Body mass index is 22.43 kg/m.  General Appearance: casually dressed, ambulating in the hall on approach, good hygiene  Eye Contact::  Good  Speech:  Clear and Coherent and Normal Rate  Volume:  Normal  Mood:  aloof  Affect:  mildly constricted, calm  Thought Process:  Goal Directed and Linear  Orientation:  Oriented to year, city and self  Thought Content:  Denies AVH, first rank symptoms, paranoia, or ideas of reference. Does not appear to be grossly responding to internal/external stimuli on exam; no delusions noted  Suicidal Thoughts:  No  Homicidal Thoughts:  No  Memory:  Recent;   Fair  Judgement:  Fair  Insight:  Fair  Psychomotor Activity:  Normal, no stiffness, no cogwheeling, no tremor  Concentration:  Fair  Recall:  [employer] of Knowledge:Fair  Language: Good  Akathisia:  Negative  AIMS (if indicated):   0  Assets:  Desire for Improvement Resilience  Sleep:  Number of Hours: 6.5  Cognition: WNL  ADL's:  Intact   Mental Status Per Nursing Assessment::   On Admission:  Suicidal ideation indicated by patient,Suicide plan,Self-harm thoughts,Self-harm behaviors,Intention to act on suicide plan  Demographic Factors:  Male and Adolescent or young adult, living alone, unemployed, low socioeconomic status  Loss Factors: Financial problems/change in socioeconomic status  Historical Factors: Prior psychiatric hospitalizations/diagnoses, Family history of mental illness or substance abuse, Impulsivity; h/o prior trauma  Risk Reduction Factors:   Has a child  Continued Clinical Symptoms:  Substance abuse prior to admission; schizoaffective d/o diagnosis  Cognitive Features That Contribute To Risk:  Thought constriction (tunnel vision)    Suicide Risk:  Minimal: No identifiable suicidal ideation.   Patients presenting with no risk factors but with morbid ruminations; may be classified as minimal risk based on the severity of the depressive symptoms   Follow-up [place]. Go on 05/02/2021.   Specialty: Behavioral Health Why: You have an appointment on 05/02/21 at 7:45 am for therapy services.  You also have an appointment on 05/28/21 at 7:45 am for medication management.  These appointments will be held in person and are first come, first served. Contact information: [facility] Follow up.   Contact information: [place], [place] [address]  P:  [phone]       [facility] Follow up.   Why: A referral has been made on your behalf for [employer] program to assist in appyling for disability and SSI benefits.  Contact information: [address] [place], [place] [address]    Telephone: [phone] Fax: [phone]              Plan Of Care/Follow-up recommendations:  Activity:  as tolerated Diet:  heart healthy Other:  Patient encouraged to comply with medications and to keep scheduled outpatient appointments after discharge. He received his first loading Invega sustenna injection 234mg  IM on 04/25/21 and his second loading injection of 156mg  IM on 04/29/21. He is due for monthly maintenance injection on 05/27/21. He was advised he will need to continue oral bridging Risperdal while his long-acting injection reaches steady state and that his outpatient provider can determine at his follow-up appointment when to start tapering off his oral Risperdal. He was advised he will need ongoing lipid, weight, glucose, EKG, and CBC monitoring while on an antipsychotic medication. He was encouraged to abstain from drugs and alcohol after discharge.   Harlow Asa, MD, FAPA 04/30/2021, 7:50 AM

## 2021-05-02 ENCOUNTER — Emergency Department (HOSPITAL_COMMUNITY)
Admission: EM | Admit: 2021-05-02 | Discharge: 2021-05-03 | Disposition: A | Discharge status: Home / Self Care | Payer: Medicaid Other | Attending: Emergency Medicine | Admitting: Emergency Medicine

## 2021-05-02 DIAGNOSIS — M79672 Pain in left foot: Secondary | ICD-10-CM | POA: Insufficient documentation

## 2021-05-02 DIAGNOSIS — M79671 Pain in right foot: Secondary | ICD-10-CM | POA: Diagnosis present

## 2021-05-02 DIAGNOSIS — F17210 Nicotine dependence, cigarettes, uncomplicated: Secondary | ICD-10-CM | POA: Insufficient documentation

## 2021-05-02 DIAGNOSIS — M79673 Pain in unspecified foot: Secondary | ICD-10-CM

## 2021-05-03 ENCOUNTER — Other Ambulatory Visit: Payer: Self-pay

## 2021-05-03 ENCOUNTER — Encounter (HOSPITAL_COMMUNITY): Payer: Self-pay

## 2021-05-03 MED ORDER — IBUPROFEN 400 MG PO TABS
600.0000 mg | ORAL_TABLET | Freq: Once | ORAL | Status: AC
  Administered 2021-05-03: 600 mg via ORAL
  Filled 2021-05-03: qty 1

## 2021-05-03 NOTE — ED Triage Notes (Signed)
Bilateral foot pain all throughout the day. Pt states "both bottom of my feet hurt from walking".

## 2021-05-03 NOTE — ED Notes (Signed)
Patient verbalizes understanding of discharge instructions. Opportunity for questioning and answers were provided. Armband removed by staff, pt discharged from ED and ambulated to lobby to return home via bus 

## 2021-05-03 NOTE — ED Notes (Signed)
The pt reports that the bottoms of both his feet are hurting all day and then he wants something to eat and drink

## 2021-05-03 NOTE — ED Provider Notes (Signed)
[place] EMERGENCY DEPARTMENT Provider Note   CSN: [id number] Arrival date & time: 05/02/21  2312     History Chief Complaint  Patient presents with  . Foot Pain    Tyler White is a 25 y.o. male.  25 year old male with a history of schizophrenia presents to the emergency department for complaints of pain to bilateral feet.  Reports constant, dull pain to the plantar aspect of his feet, but also expresses concern that his right great toe may be infected as it appears discolored.  No medications taken for pain/symptoms.  Denies any known modifying factors.  Reports that he has been walking around barefoot in a "contaminated house".       Past Medical History:  Diagnosis Date  . Hernia, inguinal, right   . Inguinal hernia    right  . Psychiatric illness   . Schizophren[facility])     Patient Active Problem List   Diagnosis Date Noted  . Schizoaffective disorder, bipolar type ([place]) 04/26/2021  . Marijuana abuse 04/26/2021  . Polysubstance abuse ([place]) 04/23/2021  . Homelessness 04/23/2021  . Schizophrenia ([place]) 11/19/2020  . Cocaine abuse ([place]) 02/19/2018  . Cocaine abuse with cocaine-induced mood disorder ([place]) 02/19/2018  . Psychosis ([place]) 11/27/2017  . Cannabis abuse with psychotic disorder, with delusions ([place]) 11/26/2017  . Schizophrenia, unspecified ([place]) 11/26/2017    History reviewed. No pertinent surgical history.     Family History  Problem Relation Age of Onset  . Psychiatric Illness Mother   . Hypertension Sister     Social History   Tobacco Use  . Smoking status: Current Every Day Smoker    Packs/day: 0.50    Years: 5.00    Pack years: 2.50    Types: Cigarettes  . Smokeless tobacco: Never Used  Vaping Use  . Vaping Use: Never used  Substance Use Topics  . Alcohol use: Yes  . Drug use: Yes    Types: Marijuana    Home Medications Prior to Admission medications   Medication Sig Start Date End Date Taking? Authorizing  Provider  benztropine (COGENTIN) 0.5 MG tablet Take 1 tablet (0.5 mg total) by mouth 2 (two) times daily as needed for tremors. 04/30/21   Ethelene Hal, NP  gabapentin (NEURONTIN) 400 MG capsule Take 1 capsule (400 mg total) by mouth 3 (three) times daily. 04/30/21   Ethelene Hal, NP  hydrOXYzine (ATARAX/VISTARIL) 25 MG tablet Take 1 tablet (25 mg total) by mouth 3 (three) times daily as needed for anxiety. 04/30/21   Ethelene Hal, NP  paliperidone (INVEGA SUSTENNA) 156 MG/ML SUSY injection Inject 1 mL (156 mg total) into the muscle once for 1 dose. Next dose is due 05/27/2021 04/30/21 04/30/21  Ethelene Hal, NP  risperiDONE (RISPERDAL M-TABS) 2 MG disintegrating tablet Take 1 tablet (2 mg total) by mouth at bedtime. 04/30/21   Ethelene Hal, NP  traZODone (DESYREL) 50 MG tablet Take 1 tablet (50 mg total) by mouth at bedtime as needed for sleep. 04/30/21   Ethelene Hal, NP  Vitamin D, Ergocalciferol, (DRISDOL) 1.25 MG (50000 UNIT) CAPS capsule Take 1 capsule (50,000 Units total) by mouth every 7 (seven) days. Next dose due on 05/02/2021 05/02/21   Ethelene Hal, NP    Allergies    Patient has no known allergies.  Review of Systems   Review of Systems  Ten systems reviewed and are negative for acute change, except as noted in the HPI.    Physical  Exam Updated Vital Signs BP 122/66 (BP Location: Right Arm)   Pulse (!) 109   Temp 98 F (36.7 C) (Oral)   Resp 16   Ht 5\' 8"  (1.727 m)   Wt 67 kg   SpO2 98%   BMI 22.46 kg/m   Physical Exam Vitals and nursing note reviewed.  Constitutional:      General: He is not in acute distress.    Appearance: He is well-developed. He is not diaphoretic.     Comments: Nontoxic-appearing and in no acute distress  HENT:     Head: Normocephalic and atraumatic.  Eyes:     General: No scleral icterus.    Conjunctiva/sclera: Conjunctivae normal.  Cardiovascular:     Rate and Rhythm: Normal rate and regular  rhythm.     Pulses: Normal pulses.     Comments: DP pulse 2+ bilaterally Pulmonary:     Effort: Pulmonary effort is normal. No respiratory distress.     Comments: Respirations even and unlabored Musculoskeletal:        General: Normal range of motion.     Cervical back: Normal range of motion.     Comments: No edema, erythema, heat to touch to bilateral feet.  Compartments soft.  Able to move all toes.  Skin:    General: Skin is warm and dry.     Coloration: Skin is not pale.     Findings: No erythema or rash.  Neurological:     Mental Status: He is alert and oriented to person, place, and time.     Comments: Ambulatory in the department without difficulty  Psychiatric:        Behavior: Behavior normal.     Comments: Not reacting to internal stimuli     ED Results / Procedures / Treatments   Labs (all labs ordered are listed, but only abnormal results are displayed) Labs Reviewed - No data to display  EKG None  Radiology No results found.  Procedures Procedures   Medications Ordered in ED Medications  ibuprofen (ADVIL) tablet 600 mg (has no administration in time range)    ED Course  I have reviewed the triage vital signs and the nursing notes.  Pertinent labs & imaging results that were available during my care of the patient were reviewed by me and considered in my medical decision making (see chart for details).    MDM Rules/Calculators/A&P                          25 year old male presenting for pain to bilateral feet.  No history of trauma.  He is neurovascularly intact.  Compartments are soft, compressible.  He does have a callus to the plantar aspect of his right great toe.  No concern for infection, cellulitis, abscess.  Given ibuprofen in the emergency department for symptoms.  Discharged in stable condition.   Final Clinical Impression(s) / ED Diagnoses Final diagnoses:  Pain of foot, unspecified laterality    Rx / DC Orders ED Discharge Orders     None       Antonietta Breach, PA-C 05/03/21 0422    Lorelle Gibbs, DO 05/03/21 [id number]

## 2021-05-03 NOTE — ED Provider Notes (Signed)
Emergency Medicine Provider Triage Evaluation Note  Tyler White , a 25 y.o. male  was evaluated in triage.  Pt complains of constant, dull pain to plantar aspect of b/l feet. Pain is nonradiating. Has been walking around barefoot in a "contaminated house".   Review of Systems  Positive: B/l foot pain Negative: Wounds  Physical Exam  There were no vitals taken for this visit. Gen:   Awake, no distress   Resp:  Normal effort  MSK:   Moves extremities without difficulty  Other:  TTP to bottoms of feet without obvious wounds. Neurovascularly intact BLE  Medical Decision Making  Medically screening exam initiated at 12:12 AM.  Appropriate orders placed.  Tyler White was informed that the remainder of the evaluation will be completed by another provider, this initial triage assessment does not replace that evaluation, and the importance of remaining in the ED until their evaluation is complete.     Antonietta Breach, PA-C 05/03/21 0014    Lorelle Gibbs, DO 05/03/21 [phone]

## 2021-05-04 ENCOUNTER — Other Ambulatory Visit: Payer: Self-pay

## 2021-05-04 ENCOUNTER — Emergency Department (HOSPITAL_COMMUNITY)
Admission: EM | Admit: 2021-05-04 | Discharge: 2021-05-05 | Disposition: A | Discharge status: Home / Self Care | Payer: Medicaid Other | Attending: Emergency Medicine | Admitting: Emergency Medicine

## 2021-05-04 DIAGNOSIS — Z20822 Contact with and (suspected) exposure to covid-19: Secondary | ICD-10-CM | POA: Diagnosis not present

## 2021-05-04 DIAGNOSIS — F17210 Nicotine dependence, cigarettes, uncomplicated: Secondary | ICD-10-CM | POA: Diagnosis not present

## 2021-05-04 DIAGNOSIS — R45851 Suicidal ideations: Secondary | ICD-10-CM | POA: Diagnosis not present

## 2021-05-04 DIAGNOSIS — F250 Schizoaffective disorder, bipolar type: Secondary | ICD-10-CM | POA: Insufficient documentation

## 2021-05-04 DIAGNOSIS — R443 Hallucinations, unspecified: Secondary | ICD-10-CM | POA: Diagnosis present

## 2021-05-04 LAB — COMPREHENSIVE METABOLIC PANEL
ALT: 27 U/L (ref 0–44)
AST: 30 U/L (ref 15–41)
Albumin: 3.6 g/dL (ref 3.5–5.0)
Alkaline Phosphatase: 46 U/L (ref 38–126)
Anion gap: 5 (ref 5–15)
BUN: 17 mg/dL (ref 6–20)
CO2: 26 mmol/L (ref 22–32)
Calcium: 9.1 mg/dL (ref 8.9–10.3)
Chloride: 105 mmol/L (ref 98–111)
Creatinine, Ser: 0.96 mg/dL (ref 0.61–1.24)
GFR, Estimated: >60 mL/min (ref >60)
Glucose, Bld: 88 mg/dL (ref 70–99)
Potassium: 4.1 mmol/L (ref 3.5–5.1)
Sodium: 136 mmol/L (ref 135–145)
Total Bilirubin: 1 mg/dL (ref 0.3–1.2)
Total Protein: 6.2 g/dL — ABNORMAL LOW (ref 6.5–8.1)

## 2021-05-04 LAB — CBC WITH DIFFERENTIAL/PLATELET
Abs Immature Granulocytes: 0.02 10*3/uL (ref 0.00–0.07)
Basophils Absolute: 0 10*3/uL (ref 0.0–0.1)
Basophils Relative: 1 %
Eosinophils Absolute: 0.3 10*3/uL (ref 0.0–0.5)
Eosinophils Relative: 4 %
HCT: 39.1 % (ref 39.0–52.0)
Hemoglobin: 12.8 g/dL — ABNORMAL LOW (ref 13.0–17.0)
Immature Granulocytes: 0 %
Lymphocytes Relative: 18 %
Lymphs Abs: 1.5 10*3/uL (ref 0.7–4.0)
MCH: 28 pg (ref 26.0–34.0)
MCHC: 32.7 g/dL (ref 30.0–36.0)
MCV: 85.6 fL (ref 80.0–100.0)
Monocytes Absolute: 1 10*3/uL (ref 0.1–1.0)
Monocytes Relative: 12 %
Neutro Abs: 5.2 10*3/uL (ref 1.7–7.7)
Neutrophils Relative %: 65 %
Platelets: 264 10*3/uL (ref 150–400)
RBC: 4.57 MIL/uL (ref 4.22–5.81)
RDW: 14.2 % (ref 11.5–15.5)
WBC: 8 10*3/uL (ref 4.0–10.5)
nRBC: 0 % (ref 0.0–0.2)

## 2021-05-04 LAB — RESP PANEL BY RT-PCR (FLU A&B, COVID) ARPGX2
Influenza A by PCR: NEGATIVE
Influenza B by PCR: NEGATIVE
SARS Coronavirus 2 by RT PCR: NEGATIVE

## 2021-05-04 LAB — RAPID URINE DRUG SCREEN, HOSP PERFORMED
Amphetamines: NOT DETECTED
Barbiturates: NOT DETECTED
Benzodiazepines: NOT DETECTED
Cocaine: POSITIVE — AB
Opiates: NOT DETECTED
Tetrahydrocannabinol: POSITIVE — AB

## 2021-05-04 LAB — ETHANOL: Alcohol, Ethyl (B): <10 mg/dL (ref <10)

## 2021-05-04 MED ORDER — HYDROXYZINE HCL 25 MG PO TABS
25.0000 mg | ORAL_TABLET | Freq: Three times a day (TID) | ORAL | Status: DC | PRN
  Administered 2021-05-04: 25 mg via ORAL
  Filled 2021-05-04: qty 1

## 2021-05-04 MED ORDER — TRAZODONE HCL 50 MG PO TABS: 50.0000 mg | ORAL_TABLET | Freq: Every evening | ORAL | Status: DC | PRN

## 2021-05-04 MED ORDER — GABAPENTIN 300 MG PO CAPS
400.0000 mg | ORAL_CAPSULE | Freq: Three times a day (TID) | ORAL | Status: DC
  Administered 2021-05-04 (×2): 400 mg via ORAL
  Filled 2021-05-04 (×2): qty 1

## 2021-05-04 MED ORDER — BENZTROPINE MESYLATE 0.5 MG PO TABS
0.5000 mg | ORAL_TABLET | Freq: Two times a day (BID) | ORAL | Status: DC | PRN
  Filled 2021-05-04: qty 1

## 2021-05-04 MED ORDER — RISPERIDONE 2 MG PO TBDP
2.0000 mg | ORAL_TABLET | Freq: Every day | ORAL | Status: DC
  Administered 2021-05-05: 2 mg via ORAL
  Filled 2021-05-04: qty 1

## 2021-05-04 NOTE — ED Triage Notes (Signed)
Pt just left the ED last night , back to the ED this morning with c/o SI and hallucinations

## 2021-05-04 NOTE — BH Assessment (Signed)
DISPOSITION: Gave clinical report to [name], NP who determined Pt does not meets criteria for inpatient psychiatric treatment.. Notified Dr. [name] , MD and [name],  RN of disposition recommendation and the sitter utilization recommendation.

## 2021-05-04 NOTE — ED Notes (Signed)
Pt has been mostly sleeping during my time with him.  Pt was plite and calm and cooperative fduring my interactions with him.  Pt was given snacks and dinner which he ate

## 2021-05-04 NOTE — ED Provider Notes (Signed)
[place] EMERGENCY DEPARTMENT Provider Note   CSN: [id number] Arrival date & time: 05/04/21  0814     History No chief complaint on file.   Cohl Behrens is a 25 y.o. male.  HPI Patient presents with suicidal thoughts.  States he always has a thoughts.  Gotten worse.  States he has had hallucinations recently that someone is trying to take his disability.  Denies any substance abuse.  Recent admission to behavioral health.  He is on sustained-release Invega.  Was seen in the ER yesterday for foot pain.  States his feet still hurt.  Patient states he does not think that he would actually hurt himself.    Past Medical History:  Diagnosis Date  . Hernia, inguinal, right   . Inguinal hernia    right  . Psychiatric illness   . Schizophrenia [facility])     Patient Active Problem List   Diagnosis Date Noted  . Schizoaffective disorder, bipolar type ([place]) 04/26/2021  . Marijuana abuse 04/26/2021  . Polysubstance abuse ([place]) 04/23/2021  . Homelessness 04/23/2021  . Schizophrenia ([place]) 11/19/2020  . Cocaine abuse ([place]) 02/19/2018  . Cocaine abuse with cocaine-induced mood disorder ([place]) 02/19/2018  . Psychosis ([place]) 11/27/2017  . Cannabis abuse with psychotic disorder, with delusions ([place]) 11/26/2017  . Schizophrenia, unspecified ([place]) 11/26/2017    No past surgical history on file.     Family History  Problem Relation Age of Onset  . Psychiatric Illness Mother   . Hypertension Sister     Social History   Tobacco Use  . Smoking status: Current Every Day Smoker    Packs/day: 0.50    Years: 5.00    Pack years: 2.50    Types: Cigarettes  . Smokeless tobacco: Never Used  Vaping Use  . Vaping Use: Never used  Substance Use Topics  . Alcohol use: Yes  . Drug use: Yes    Types: Marijuana    Home Medications Prior to Admission medications   Medication Sig Start Date End Date Taking? Authorizing Provider  benztropine (COGENTIN) 0.5 MG tablet Take  1 tablet (0.5 mg total) by mouth 2 (two) times daily as needed for tremors. 04/30/21   Ethelene Hal, NP  gabapentin (NEURONTIN) 400 MG capsule Take 1 capsule (400 mg total) by mouth 3 (three) times daily. 04/30/21   Ethelene Hal, NP  hydrOXYzine (ATARAX/VISTARIL) 25 MG tablet Take 1 tablet (25 mg total) by mouth 3 (three) times daily as needed for anxiety. 04/30/21   Ethelene Hal, NP  paliperidone (INVEGA SUSTENNA) 156 MG/ML SUSY injection Inject 1 mL (156 mg total) into the muscle once for 1 dose. Next dose is due 05/27/2021 04/30/21 04/30/21  Ethelene Hal, NP  risperiDONE (RISPERDAL M-TABS) 2 MG disintegrating tablet Take 1 tablet (2 mg total) by mouth at bedtime. 04/30/21   Ethelene Hal, NP  traZODone (DESYREL) 50 MG tablet Take 1 tablet (50 mg total) by mouth at bedtime as needed for sleep. 04/30/21   Ethelene Hal, NP  Vitamin D, Ergocalciferol, (DRISDOL) 1.25 MG (50000 UNIT) CAPS capsule Take 1 capsule (50,000 Units total) by mouth every 7 (seven) days. Next dose due on 05/02/2021 05/02/21   Ethelene Hal, NP    Allergies    Patient has no known allergies.  Review of Systems   Review of Systems  Constitutional: Negative for appetite change and fever.  HENT: Negative for congestion.   Respiratory: Negative for shortness of breath.   Cardiovascular:  Negative for chest pain.  Gastrointestinal: Negative for abdominal pain.  Genitourinary: Negative for flank pain.  Musculoskeletal: Negative for gait problem.       Bilateral foot pain  Skin: Negative for rash.  Neurological: Negative for weakness.  Psychiatric/Behavioral: Positive for dysphoric mood, hallucinations and suicidal ideas.    Physical Exam Updated Vital Signs BP (!) 96/47 (BP Location: Right Arm)   Pulse 62   Temp 98.5 F (36.9 C)   Resp 16   SpO2 100%   Physical Exam Vitals and nursing note reviewed.  HENT:     Head: Atraumatic.     Mouth/Throat:     Mouth: Mucous  membranes are moist.  Eyes:     Pupils: Pupils are equal, round, and reactive to light.  Cardiovascular:     Rate and Rhythm: Regular rhythm.  Pulmonary:     Breath sounds: No wheezing, rhonchi or rales.  Abdominal:     Tenderness: There is no abdominal tenderness.  Musculoskeletal:     Cervical back: Neck supple.     Right lower leg: No edema.     Left lower leg: No edema.  Skin:    General: Skin is warm.     Capillary Refill: Capillary refill takes less than 2 seconds.  Neurological:     Mental Status: He is alert and oriented to person, place, and time.  Psychiatric:        Mood and Affect: Mood normal.     ED Results / Procedures / Treatments   Labs (all labs ordered are listed, but only abnormal results are displayed) Labs Reviewed  COMPREHENSIVE METABOLIC PANEL - Abnormal; Notable for the following components:      Result Value   Total Protein 6.2 (*)    All other components within normal limits  RAPID URINE DRUG SCREEN, HOSP PERFORMED - Abnormal; Notable for the following components:   Cocaine POSITIVE (*)    Tetrahydrocannabinol POSITIVE (*)    All other components within normal limits  CBC WITH DIFFERENTIAL/PLATELET - Abnormal; Notable for the following components:   Hemoglobin 12.8 (*)    All other components within normal limits  RESP PANEL BY RT-PCR (FLU A&B, COVID) ARPGX2  ETHANOL    EKG None  Radiology No results found.  Procedures Procedures   Medications Ordered in ED Medications  benztropine (COGENTIN) tablet 0.5 mg (has no administration in time range)  gabapentin (NEURONTIN) capsule 400 mg (400 mg Oral Given 05/04/21 1541)  hydrOXYzine (ATARAX/VISTARIL) tablet 25 mg (25 mg Oral Given 05/04/21 1541)  risperiDONE (RISPERDAL M-TABS) disintegrating tablet 2 mg (has no administration in time range)  traZODone (DESYREL) tablet 50 mg (has no administration in time range)    ED Course  I have reviewed the triage vital signs and the nursing  notes.  Pertinent labs & imaging results that were available during my care of the patient were reviewed by me and considered in my medical decision making (see chart for details).    MDM Rules/Calculators/A&P                          Patient presents and states that he is suicidal.  States he is feeling bad because he had hallucinations that someone is stealing his disability.  History of suicidal thoughts.  Patient is medically cleared.  We will have patient seen by TTS.  Final Clinical Impression(s) / ED Diagnoses Final diagnoses:  Suicidal ideations    Rx / DC Orders  ED Discharge Orders    None       [name], MD 05/04/21 [phone]

## 2021-05-04 NOTE — ED Notes (Signed)
Pt is having TTS °

## 2021-05-04 NOTE — BH Assessment (Signed)
Comprehensive Clinical Assessment (CCA) Note  05/04/2021 Tyler White [id number]    DISPOSITION: Gave clinical report to [name], NP who determined Pt does not meets criteria for inpatient psychiatric treatment.. Notified Dr. [name] , MD and [name],  RN of disposition recommendation and the sitter utilization recommendation.    [facility] from 05/04/2021 in [facility] from 05/02/2021 in [facility] (Discharged) from 04/23/2021 in [facility]  C-SSRS RISK CATEGORY Moderate Risk Error: Question 6 not populated High Risk     The patient demonstrates the following risk factors for suicide: Chronic risk factors for suicide include: psychiatric disorder of schizoaffective  and substance use disorder. Acute risk factors for suicide include: family or marital conflict and loss (financial, interpersonal, professional). Protective factors for this patient include: responsibility to others (children, family). Considering these factors, the overall suicide risk at this point appears to be moderate. Patient is appropriate for outpatient follow up.  Pt is a 25 yo male who presents voluntarily to [facility] via car . Pt was accompanied by himself reporting symptoms of auditory hallucinations and suicidal  ideations. Pt has a history of schizoaffective disorder and says he was referred for assessment by himself . Pt denies  Medication. Patient reports someone stole his samples given to him after d/c from [facility]. Pt denies  current suicidal ideation with no plans of self harm and no past attempts. Pt denies homicidal ideation/ history of violence. Pt reports auditory & visual hallucinations or other symptoms of psychosis. Patient reports having a dream after smoking cocaine and THC that someone was trying to take something from him (disability check ) Patient does not receive a  check he just applied for it.  Pt states current stressors include being homeless and no family or friends. Patient reports his family up and left him and moved back to [place] or [place]. He stated he has no place to stay or no one to turn to because he made his family mad.   Pt is homeless and limited supports. Pt denies a hx of abuse and trauma. Pt reports there is a family history of mental health  . Pt is unemployed. Pt has fair insight and judgment. Pt's memory is intact and denies any legal history.   Pt denies OP history includes and IP  history includes  [place] . Last admission was at [facility] .Pt reports  alcohol/ substance abuse. Patient reports smoking cocaine and THC.   MSE: Pt is casually dressed, alert, oriented x5 with normal speech and normal motor behavior. Eye contact is good. Pt's mood is depressed and affect is depressed and anxious. Affect is congruent with mood. Thought process is coherent and relevant. There is no indication Pt is currently responding to internal stimuli or experiencing delusional thought content. Pt was cooperative throughout assessment.    DISPOSITION: Gave clinical report to [name], NP who determined Pt does not meets criteria for inpatient psychiatric treatment.. Notified Dr. [name] , MD and [name],  RN of disposition recommendation and the sitter utilization recommendation.    Chief Complaint: No chief complaint on file.  Visit Diagnosis:  Suicidal ideations Schizoaffective disorder, bipolar type ([place])      CCA Screening, Triage and Referral (STR)  Patient Reported Information How did you hear about [place]? Self  Referral name: Tyler White  Referral phone number: No data recorded  Whom do you see for routine medical  problems? I don't have a doctor  Practice/Facility Name: No data recorded Practice/Facility Phone Number: No data recorded Name of Contact: No data recorded Contact Number: No data  recorded Contact Fax Number: No data recorded Prescriber Name: No data recorded Prescriber Address (if known): No data recorded  What Is the Reason for Your Visit/Call Today? Hallucinations  How Long Has This Been Causing You Problems? <Week  What Do You Feel Would Help You the Most Today? Treatment for Depression or other mood problem   Have You Recently Been in Any Inpatient Treatment (Hospital/Detox/Crisis Center/28-Day Program)? Yes  Name/Location of Program/Hospital:[facility]  How Long Were You There? 7 days  When Were You Discharged? 04/30/2021   Have You Ever Received Services From [employer] Before? Yes  Who Do You See at [facility]? Prior psych hx per chart review   Have You Recently Had Any Thoughts About Hurting Yourself? No  Are You Planning to Commit Suicide/Harm Yourself At This time? No   Have you Recently Had Thoughts About [name]? No  Explanation: No data recorded  Have You Used Any Alcohol or Drugs in the Past 24 Hours? Yes  How Long Ago Did You Use Drugs or Alcohol? 0000 (UTA)  What Did You Use and How Much? unknown   Do You Currently Have a Therapist/Psychiatrist? No  Name of Therapist/Psychiatrist: No data recorded  Have You Been Recently Discharged From Any Office Practice or Programs? No  Explanation of Discharge From Practice/Program: No data recorded    CCA Screening Triage Referral Assessment Type of Contact: Tele-Assessment  Is this Initial or Reassessment? Initial Assessment  Date Telepsych consult ordered in [facility]:  05/04/2021  Time Telepsych consult ordered in [facility]:  [id number]   Patient Reported Information Reviewed? Yes  Patient Left Without Being Seen? No data recorded Reason for Not Completing Assessment: No data recorded  Collateral Involvement: TTS was able to contact patient's father for collateral information as documented in narrative   Does Patient Have a [place]? No data recorded Name  and Contact of Legal Guardian: No data recorded If Minor and Not Living with Parent(s), Who has Custody? No data recorded Is CPS involved or ever been involved? Never  Is APS involved or ever been involved? Never   Patient Determined To Be At Risk for Harm To Self or Others Based on Review of Patient Reported Information or Presenting Complaint? No  Method: No data recorded Availability of Means: No data recorded Intent: No data recorded Notification Required: No data recorded Additional Information for Danger to Others Potential: No data recorded Additional Comments for Danger to Others Potential: No data recorded Are There Guns or Other Weapons in Your Home? No data recorded Types of Guns/Weapons: No data recorded Are These Weapons Safely Secured?                            No data recorded Who Could Verify You Are Able To Have These Secured: No data recorded Do You Have any Outstanding Charges, Pending Court Dates, Parole/Probation? No data recorded Contacted To Inform of Risk of Harm To Self or Others: Unable to Contact: (NA)   Location of Assessment: [facility] ED   Does Patient Present under Involuntary Commitment? No  IVC Papers Initial File Date: No data recorded  [place] of Residence: [place]   Patient Currently Receiving the Following Services: Not Receiving Services   Determination of Need: Routine (7 days)  Options For Referral: Outpatient Therapy; Medication Management     CCA Biopsychosocial Intake/Chief Complaint:  Pt has diagnosis of schizophrenia and is not taking medication. He states he is experiencing auditory hallucinations.  A dream that someone was taking his disability check that he does not receive  Current Symptoms/Problems: Pt reports auditory hallucinations that he cannot describe. He reports sleeping 2 hours per night. He reports he is homeless asnd lost his medication samples   Patient Reported Schizophrenia/Schizoaffective Diagnosis in Past:  Yes   Strengths: UTA  Preferences: Patient has no preferences that require accommodation  Abilities: UTA   Type of Services Patient Feels are Needed: assistance with housing   Initial Clinical Notes/Concerns: Pt appears to be thought blocking and is a poor historian.   Mental Health Symptoms Depression:  Change in energy/activity; Difficulty Concentrating; Sleep (too much or little)   Duration of Depressive symptoms: Greater than two weeks   Mania:  N/A   Anxiety:   Difficulty concentrating; Sleep   Psychosis:  Hallucinations   Duration of Psychotic symptoms: Less than six months   Trauma:  None   Obsessions:  None   Compulsions:  None   Inattention:  None   Hyperactivity/Impulsivity:  N/A   Oppositional/Defiant Behaviors:  None   Emotional Irregularity:  None   Other Mood/Personality Symptoms:  NA    Mental Status Exam Appearance and self-care  Stature:  Average   Weight:  Average weight   Clothing:  Casual   Grooming:  Normal (scrubs)   Cosmetic use:  None   Posture/gait:  Normal   Motor activity:  Slowed   Sensorium  Attention:  Normal   Concentration:  Normal   Orientation:  Object; Person; Place; Time; Situation   Recall/memory:  Defective in Remote   Affect and Mood  Affect:  Appropriate   Mood:  Euthymic   Relating  Eye contact:  Normal   Facial expression:  Responsive   Attitude toward examiner:  Cooperative   Thought and Language  Speech flow: Slow; Slurred   Thought content:  Appropriate to Mood and Circumstances   Preoccupation:  None   Hallucinations:  Auditory   Organization:  No data recorded  [employer] of Knowledge:  Average   Intelligence:  Average   Abstraction:  Normal   Judgement:  Impaired   Reality Testing:  Variable   Insight:  Lacking   Decision Making:  Only simple   Social Functioning  Social Maturity:  Isolates   Social Judgement:  "Games developer"   Stress   Stressors:  Housing; Teacher, music Ability:  Deficient supports   Skill Deficits:  Self-care; Decision making   Supports:  Support needed     Religion: Religion/Spirituality Are You A Religious Person?: Yes What is Your Religious Affiliation?: [employer] [place] How Might This Affect Treatment?: NA  Leisure/Recreation: Leisure / Recreation Do You Have Hobbies?: Yes Leisure and Hobbies: Reading about history  Exercise/Diet: Exercise/Diet Do You Exercise?: No Have You Gained or Lost A Significant Amount of Weight in the Past Six Months?: No Do You Follow a Special Diet?: No Do You Have Any Trouble Sleeping?: Yes Explanation of Sleeping Difficulties: Pt reports poor sleep   CCA Employment/Education Employment/Work Situation: Employment / Work Situation Employment situation: Unemployed Patient's job has been impacted by current illness: No What is the longest time patient has a held a job?: N/A Where was the patient employed at that time?: N/A Has patient ever been in  the [employer]?: No  Education: Education Last Grade Completed: 11 Did Teacher, adult education From [employer]?: No Did [place]?: No Did [place]?: No Did You Have Any Special Interests In School?: History Did You Have An Individualized Education Program (IIEP): No Did You Have Any Difficulty At School?: No   CCA Family/Childhood History Family and Relationship History: Family history Marital status: Single Are you sexually active?:  (Unable to assess) What is your sexual orientation?: Heterosexual Has your sexual activity been affected by drugs, alcohol, medication, or emotional stress?: NA Does patient have children?: Yes How many children?: 1 How is patient's relationship with their children?: Pt reports having a 23 year old daughter who lives with her mother  Childhood History:  Childhood History By whom was/is the patient raised?: Mother Description of  patient's relationship with caregiver when they were a child: Patient was closest to his mother growing up, but later went to live with his father Patient's description of current relationship with people who raised him/her: "Okay" How were you disciplined when you got in trouble as a child/adolescent?: unable to assess Does patient have siblings?: Yes Number of Siblings: 9 Description of patient's current relationship with siblings: "Fine" Did patient suffer any verbal/emotional/physical/sexual abuse as a child?: No Did patient suffer from severe childhood neglect?: No Has patient ever been sexually abused/assaulted/raped as an adolescent or adult?: No Was the patient ever a victim of a crime or a disaster?: No Witnessed domestic violence?: No Has patient been affected by domestic violence as an adult?: No  Child/Adolescent Assessment:     CCA Substance Use Alcohol/Drug Use: Alcohol / Drug Use Pain Medications: See MAR Prescriptions: See MAR Over the Counter: See MAR History of alcohol / drug use?: Yes Longest period of sobriety (when/how long): none reported Negative Consequences of Use: Financial,Personal relationships Substance #1 Name of Substance 1: Cocaine 1 - Age of First Use: Unknown 1 - Amount (size/oz): Unknown 1 - Frequency: "Not often" 1 - Duration: Unknown 1 - Last Use / Amount: Unknown 1 - Method of Aquiring: Unknown Substance #2 Name of Substance 2: Marijuana 2 - Age of First Use: Unknown 2 - Amount (size/oz): Unknown 2 - Frequency: Unknown 2 - Duration: Unknown 2 - Last Use / Amount: Unknown 2 - Method of Aquiring: Unknown 2 - Route of Substance Use: Unknown                     ASAM's:  Six Dimensions of Multidimensional Assessment  Dimension 1:  Acute Intoxication and/or Withdrawal Potential:   Dimension 1:  Description of individual's past and current experiences of substance use and withdrawal: Patient does not report experiencing any  complications with withdrawal symptoms  Dimension 2:  Biomedical Conditions and Complications:   Dimension 2:  Description of patient's biomedical conditions and  complications: Patient does not report any medical complications that are exacerbated by his marijuana use  Dimension 3:  Emotional, Behavioral, or Cognitive Conditions and Complications:  Dimension 3:  Description of emotional, behavioral, or cognitive conditions and complications: Patient is often non-compliant with taking his mental health medications and uses  Dimension 4:  Readiness to Change:  Dimension 4:  Description of Readiness to Change criteria: Patient has a history of non-compliance with treatment and has shown resistance to change especially concerning his drug use.  He does not seem to understand to relationship between his drug use and complications with his mental illness  Dimension 5:  Relapse, Continued  use, or Continued Problem Potential:  Dimension 5:  Relapse, continued use, or continued problem potential critiera description: Patient has no significant period of abstinence and  Dimension 6:  Recovery/Living Environment:  Dimension 6:  Recovery/Iiving environment criteria description: Homeless  ASAM Severity Score: ASAM's Severity Rating Score: 14  ASAM Recommended Level of Treatment: ASAM Recommended Level of Treatment: Level II Intensive Outpatient Treatment   Substance use Disorder (SUD) Substance Use Disorder (SUD)  Checklist Symptoms of Substance Use: Continued use despite having a persistent/recurrent physical/psychological problem caused/exacerbated by use,Continued use despite persistent or recurrent social, interpersonal problems, caused or exacerbated by use,Social, occupational, recreational activities given up or reduced due to use,Large amounts of time spent to obtain, use or recover from the substance(s)  Recommendations for Services/Supports/Treatments: Recommendations for  Services/Supports/Treatments Recommendations For Services/Supports/Treatments: Inpatient Hospitalization  DSM5 Diagnoses: Patient Active Problem List   Diagnosis Date Noted  . Schizoaffective disorder, bipolar type ([place]) 04/26/2021  . Marijuana abuse 04/26/2021  . Polysubstance abuse ([place]) 04/23/2021  . Homelessness 04/23/2021  . Schizophrenia ([place]) 11/19/2020  . Cocaine abuse ([place]) 02/19/2018  . Cocaine abuse with cocaine-induced mood disorder ([place]) 02/19/2018  . Psychosis ([place]) 11/27/2017  . Cannabis abuse with psychotic disorder, with delusions ([place]) 11/26/2017  . Schizophrenia, unspecified ([place]) 11/26/2017    Patient Centered Plan: Patient is on the following Treatment Plan(s):    Referrals to Alternative Service(s): Referred to Alternative Service(s):   Place:   Date:   Time:    Referred to Alternative Service(s):   Place:   Date:   Time:    Referred to Alternative Service(s):   Place:   Date:   Time:    Referred to Alternative Service(s):   Place:   Date:   Time:     Gerre Scull, [place]

## 2021-05-05 NOTE — Discharge Instructions (Signed)
Please use the attached resources shelter guides and for suicidal feelings.  You can always come back to the emergency department if you have any suicidal intentions or you can go to the behavioral health center or urgent care, there resources were provided to you.

## 2021-05-05 NOTE — ED Notes (Signed)
Patient given discharge instructions. Questions were answered. Patient verbalized understanding of discharge instructions and care at home.   Pt belongings returned to patient as well.

## 2021-05-05 NOTE — ED Provider Notes (Signed)
Received call from Merlyn Lot, NP who recommends discharge at this time.  Patient has been psych cleared last night.  Resources provided to patient.    "DISPOSITION: Gave clinical report Saralyn Pilar, NP who determined Ptdoes notmeets criteria for inpatient psychiatric treatment.. Notified Dr.Nathan Alvino Chapel , MDand Thornton Papas of disposition recommendation and the sitter utilization recommendation. "   Alfredia Client, PA-C 05/05/21 [id number]    Blanchie Dessert, MD 05/06/21 [phone]

## 2021-05-15 ENCOUNTER — Emergency Department (HOSPITAL_COMMUNITY)
Admission: EM | Admit: 2021-05-15 | Discharge: 2021-05-15 | Disposition: A | Discharge status: Home / Self Care | Payer: Medicaid Other | Attending: Emergency Medicine | Admitting: Emergency Medicine

## 2021-05-15 ENCOUNTER — Encounter (HOSPITAL_COMMUNITY): Payer: Self-pay

## 2021-05-15 ENCOUNTER — Other Ambulatory Visit: Payer: Self-pay

## 2021-05-15 DIAGNOSIS — F1910 Other psychoactive substance abuse, uncomplicated: Secondary | ICD-10-CM | POA: Diagnosis present

## 2021-05-15 DIAGNOSIS — R443 Hallucinations, unspecified: Secondary | ICD-10-CM

## 2021-05-15 DIAGNOSIS — F1211 Cannabis abuse, in remission: Secondary | ICD-10-CM | POA: Diagnosis present

## 2021-05-15 DIAGNOSIS — F1210 Cannabis abuse, uncomplicated: Secondary | ICD-10-CM | POA: Diagnosis present

## 2021-05-15 DIAGNOSIS — D649 Anemia, unspecified: Secondary | ICD-10-CM | POA: Diagnosis not present

## 2021-05-15 DIAGNOSIS — R45850 Homicidal ideations: Secondary | ICD-10-CM | POA: Insufficient documentation

## 2021-05-15 DIAGNOSIS — F1414 Cocaine abuse with cocaine-induced mood disorder: Secondary | ICD-10-CM | POA: Diagnosis present

## 2021-05-15 DIAGNOSIS — R441 Visual hallucinations: Secondary | ICD-10-CM | POA: Insufficient documentation

## 2021-05-15 DIAGNOSIS — F17210 Nicotine dependence, cigarettes, uncomplicated: Secondary | ICD-10-CM | POA: Diagnosis not present

## 2021-05-15 DIAGNOSIS — Z20822 Contact with and (suspected) exposure to covid-19: Secondary | ICD-10-CM | POA: Diagnosis not present

## 2021-05-15 DIAGNOSIS — R45851 Suicidal ideations: Secondary | ICD-10-CM | POA: Diagnosis not present

## 2021-05-15 DIAGNOSIS — F250 Schizoaffective disorder, bipolar type: Secondary | ICD-10-CM | POA: Diagnosis present

## 2021-05-15 DIAGNOSIS — F1410 Cocaine abuse, uncomplicated: Secondary | ICD-10-CM

## 2021-05-15 DIAGNOSIS — F12150 Cannabis abuse with psychotic disorder with delusions: Secondary | ICD-10-CM | POA: Diagnosis present

## 2021-05-15 LAB — CBC
HCT: 37.9 % — ABNORMAL LOW (ref 39.0–52.0)
Hemoglobin: 12.5 g/dL — ABNORMAL LOW (ref 13.0–17.0)
MCH: 28 pg (ref 26.0–34.0)
MCHC: 33 g/dL (ref 30.0–36.0)
MCV: 84.8 fL (ref 80.0–100.0)
Platelets: 287 10*3/uL (ref 150–400)
RBC: 4.47 MIL/uL (ref 4.22–5.81)
RDW: 14.2 % (ref 11.5–15.5)
WBC: 9.4 10*3/uL (ref 4.0–10.5)
nRBC: 0 % (ref 0.0–0.2)

## 2021-05-15 LAB — COMPREHENSIVE METABOLIC PANEL
ALT: 14 U/L (ref 0–44)
AST: 30 U/L (ref 15–41)
Albumin: 3.8 g/dL (ref 3.5–5.0)
Alkaline Phosphatase: 51 U/L (ref 38–126)
Anion gap: 5 (ref 5–15)
BUN: 14 mg/dL (ref 6–20)
CO2: 25 mmol/L (ref 22–32)
Calcium: 9.3 mg/dL (ref 8.9–10.3)
Chloride: 106 mmol/L (ref 98–111)
Creatinine, Ser: 0.88 mg/dL (ref 0.61–1.24)
GFR, Estimated: >60 mL/min (ref >60)
Glucose, Bld: 94 mg/dL (ref 70–99)
Potassium: 4 mmol/L (ref 3.5–5.1)
Sodium: 136 mmol/L (ref 135–145)
Total Bilirubin: 0.4 mg/dL (ref 0.3–1.2)
Total Protein: 6.3 g/dL — ABNORMAL LOW (ref 6.5–8.1)

## 2021-05-15 LAB — ACETAMINOPHEN LEVEL: Acetaminophen (Tylenol), Serum: <10 ug/mL — ABNORMAL LOW (ref 10–30)

## 2021-05-15 LAB — RAPID URINE DRUG SCREEN, HOSP PERFORMED
Amphetamines: NOT DETECTED
Barbiturates: NOT DETECTED
Benzodiazepines: NOT DETECTED
Cocaine: POSITIVE — AB
Opiates: NOT DETECTED
Tetrahydrocannabinol: POSITIVE — AB

## 2021-05-15 LAB — ETHANOL: Alcohol, Ethyl (B): <10 mg/dL (ref <10)

## 2021-05-15 LAB — RESP PANEL BY RT-PCR (FLU A&B, COVID) ARPGX2
Influenza A by PCR: NEGATIVE
Influenza B by PCR: NEGATIVE
SARS Coronavirus 2 by RT PCR: NEGATIVE

## 2021-05-15 LAB — SALICYLATE LEVEL: Salicylate Lvl: <7 mg/dL — ABNORMAL LOW (ref 7.0–30.0)

## 2021-05-15 MED ORDER — LORAZEPAM 1 MG PO TABS: 0.0000 mg | ORAL_TABLET | Freq: Two times a day (BID) | ORAL | Status: DC

## 2021-05-15 MED ORDER — THIAMINE HCL 100 MG/ML IJ SOLN: 100.0000 mg | Freq: Every day | INTRAMUSCULAR | Status: DC

## 2021-05-15 MED ORDER — LORAZEPAM 2 MG/ML IJ SOLN
0.0000 mg | Freq: Four times a day (QID) | INTRAMUSCULAR | Status: DC
Start: 2021-05-15 — End: 2021-05-15

## 2021-05-15 MED ORDER — LORAZEPAM 1 MG PO TABS: 0.0000 mg | ORAL_TABLET | Freq: Four times a day (QID) | ORAL | Status: DC

## 2021-05-15 MED ORDER — THIAMINE HCL 100 MG PO TABS
100.0000 mg | ORAL_TABLET | Freq: Every day | ORAL | Status: DC
  Administered 2021-05-15: 100 mg via ORAL
  Filled 2021-05-15: qty 1

## 2021-05-15 MED ORDER — LORAZEPAM 2 MG/ML IJ SOLN: 0.0000 mg | Freq: Two times a day (BID) | INTRAMUSCULAR | Status: DC

## 2021-05-15 NOTE — BH Assessment (Signed)
Comprehensive Clinical Assessment (CCA) Note  05/15/2021 [facility] [id number]   Disposition: Tyler John, PA-C recommends pt to be observed and reassessed by psychiatry in the ED, [facility] is currently at capacity. Disposition discussed with Tyler Butts, PA-C Tyler Asal, RN via secure chat in [place].  Flowsheet Row ED from 05/15/2021 in [facility] from 05/04/2021 in [facility] ED from 05/02/2021 in [facility] CATEGORY High Risk Low Risk Error: Question 6 not populated      The patient demonstrates the following risk factors for suicide: Chronic risk factors for suicide include: psychiatric disorder of Schizoaffective disorder, bipolar type ([facility]) and substance use disorder. Acute risk factors for suicide include: AVH, pt unable to contract for safety. Protective factors for this patient include: None. Considering these factors, the overall suicide risk at this point appears to be high. Patient is appropriate for outpatient follow up.  [facility] is a 25 year old male who presents voluntary and unaccompanied to [facility]. PT was a poor historian during the assessment. Clinician asked the pt, "what brought you to the hospital?" Pt reported, he has been hallucinating since Sunday (05/13/2021). Pt reports, seeing himself hurting someone else by hitting them. Clinician asked the pt about information he shared to the EDP/PA that he did not mention during the assessment but expressed it occurred, such as hearing voices telling him to kill himself and using crack cocaine. Pt reported, he's been homeless for a years and does not have supports. Pt denies, SI, HI, self-injurious behaviors and access to weapons.   Pt reported, using crack cocaine, today. Pt reported, he doesn't know hoe much he used. Pt reported, usually crack doesn't make him hallucinate. Pt's UDS  is pending.  Pt denies, being linked to OPT resources (medication management and/or counseling.) Pt has a previous inpatient admission at [facility] from 04/23/2021-04/30/2021 for Schizoaffective Disorder Bipolar Type ([facility]) and Cocaine abuse ([facility]).   Pt presents drowsy in scrubs with slow, slurred speech. Clinician had to ask the pt numerous times to repeat himself so she could understand his responses. Pt's mood was depressed. Pt's affect was flat. Pt's insight was lacking. Pt's judgement was impaired. Pt reported, if discharged he he would hurt other people. Pt reports he wants to be admitted for two days. Clinician discussed the three possible dispositions (discharged with OPT resources, observe/reassess by psychiatry or inpatient treatment) in detail. Clinician asked the pt how would hurt other people, pt replied, "physically." Clinician asked what way physically, pt replied, "I don't know."   Diagnoses: Schizoaffective Disorder Bipolar Type ([facility]).                     Cocaine use Disorder, severe.  *Pt denies, supports and declined for clinician to contact collaterals to obtain additional information.*    Chief Complaint:  Chief Complaint  Patient presents with  . Suicidal   Visit Diagnosis:    CCA Screening, Triage and Referral (STR)  Patient Reported Information How did you hear about [place]? Self  Referral name: [facility] [employer]  Referral phone number: No data recorded  Whom do you see for routine medical problems? I don't have a doctor  Practice/Facility Name: No data recorded Practice/Facility Phone Number: No data recorded Name of Contact: No data recorded Contact Number: No data recorded Contact Fax Number: No data recorded Prescriber Name: No data recorded Prescriber Address (if known): No data recorded  What Is the Reason for Your Visit/Call Today? Hallucinations  How Long Has This Been Causing You Problems? <Week  What Do You Feel Would Help You the Most  Today? Treatment for Depression or other mood problem   Have You Recently Been in Any Inpatient Treatment (Hospital/Detox/Crisis [facility]/28-Day Program)? Yes  Name/Location of Program/Hospital:[facility]  How Long Were You There? 7 days  When Were You Discharged? 04/30/2021   Have You Ever Received Services From [employer] Before? Yes  Who Do You See at [facility]? Prior psych hx per chart review   Have You Recently Had Any Thoughts About Hurting Yourself? No  Are You Planning to Commit Suicide/Harm Yourself At This time? No   Have you Recently Had Thoughts About [place]? No  Explanation: No data recorded  Have You Used Any Alcohol or Drugs in the Past 24 Hours? Yes  How Long Ago Did You Use Drugs or Alcohol? 0000 (UTA)  What Did You Use and How Much? unknown   Do You Currently Have a Therapist/Psychiatrist? No  Name of Therapist/Psychiatrist: No data recorded  Have You Been Recently Discharged From Any Office Practice or Programs? No  Explanation of Discharge From Practice/Program: No data recorded    CCA Screening Triage Referral Assessment Type of Contact: Tele-Assessment  Is this Initial or Reassessment? Initial Assessment  Date Telepsych consult ordered in [facility]:  05/04/2021  Time Telepsych consult ordered in [facility]:  1509   Patient Reported Information Reviewed? Yes  Patient Left Without Being Seen? No data recorded Reason for Not Completing Assessment: No data recorded  Collateral Involvement: [facility] was able to contact patient's father for collateral information as documented in narrative   Does Patient Have a [place]? No data recorded Name and Contact of Legal Guardian: No data recorded If Minor and Not Living with Parent(s), Who has Custody? No data recorded Is CPS involved or ever been involved? Never  Is APS involved or ever been involved? Never   Patient Determined To Be At Risk for Harm To Self or Others Based on  Review of Patient Reported Information or Presenting Complaint? No  Method: No data recorded Availability of Means: No data recorded Intent: No data recorded Notification Required: No data recorded Additional Information for Danger to Others Potential: No data recorded Additional Comments for Danger to Others Potential: No data recorded Are There Guns or Other Weapons in Your Home? No data recorded Types of Guns/Weapons: No data recorded Are These Weapons Safely Secured?                            No data recorded Who Could Verify You Are Able To Have These Secured: No data recorded Do You Have any Outstanding Charges, Pending Court Dates, Parole/Probation? No data recorded Contacted To Inform of Risk of Harm To Self or Others: Unable to Contact: (NA)   Location of Assessment: [facility] ED   Does Patient Present under Involuntary Commitment? No  IVC Papers Initial File Date: No data recorded  [place] of Residence: [place]   Patient Currently Receiving the Following Services: Not Receiving Services   Determination of Need: Routine (7 days)   Options For Referral: Outpatient Therapy; Medication Management     CCA Biopsychosocial Intake/Chief Complaint:  Per EDP/PA note: "is a 25 y.o. male with a history of schizophrenia presents to the Emergency Department complaining of gradual, persistent, progressively worsening hallucinations onset Sunday after "a bad batch of  crack." Patient reports he has been having visual hallucinations of himself harming other people.Specifically he wishes to harm the drug dealer who gave him the bad durgs. He denies auditory hallucinations. Patient also denies suicidal ideation; however reported to triage nurse that the hallucinations telling him to kill himself. Patient reports the crack makes his hallucinations worse. Nothing seems to make them better."  Current Symptoms/Problems: Pt has AVH, depressive symptoms.   Patient Reported  Schizophrenia/Schizoaffective Diagnosis in Past: Yes   Strengths: Not assessed.  Preferences: Not assessed.  Abilities: Not assessed.   Type of Services Patient Feels are Needed: Pt reports if discharged he can not contract for safety.   Initial Clinical Notes/Concerns: Pt appears to be thought blocking and is a poor historian.   Mental Health Symptoms Depression:  Difficulty Concentrating; Sleep (too much or little); Irritability; Tearfulness; Fatigue; Hopelessness; Worthlessness   Duration of Depressive symptoms: Greater than two weeks   Mania:  N/A   Anxiety:   Difficulty concentrating; Sleep   Psychosis:  Hallucinations   Duration of Psychotic symptoms: Less than six months   Trauma:  None   Obsessions:  None   Compulsions:  None   Inattention:  None   Hyperactivity/Impulsivity:  N/A   Oppositional/Defiant Behaviors:  None   Emotional Irregularity:  None   Other Mood/Personality Symptoms:  NA    Mental Status Exam Appearance and self-care  Stature:  Average   Weight:  Average weight   Clothing:  -- (Pt in scrubs.)   Grooming:  Normal (scrubs)   Cosmetic use:  None   Posture/gait:  Normal   Motor activity:  Slowed   Sensorium  Attention:  Normal   Concentration:  Normal   Orientation:  Object   Recall/memory:  Defective in Remote   Affect and Mood  Affect:  Flat   Mood:  Depressed   Relating  Eye contact:  Normal   Facial expression:  -- (Sleep.)   Attitude toward examiner:  Guarded   Thought and Language  Speech flow: Slow; Slurred   Thought content:  Appropriate to Mood and Circumstances   Preoccupation:  None   Hallucinations:  Auditory; Visual   Organization:  No data recorded  [employer] of Knowledge:  Average   Intelligence:  Average   Abstraction:  Normal   Judgement:  Impaired   Reality Testing:  Variable   Insight:  Lacking   Decision Making:  Only simple   Social Functioning   Social Maturity:  Isolates   Social Judgement:  "Games developer"   Stress  Stressors:  Housing; Teacher, music Ability:  Deficient supports   Skill Deficits:  Self-care; Decision making   Supports:  Support needed     Religion: Religion/Spirituality Are You A Religious Person?: No How Might This Affect Treatment?: NA  Leisure/Recreation: Leisure / Recreation Do You Have Hobbies?: No  Exercise/Diet: Exercise/Diet Do You Exercise?: No Have You Gained or Lost A Significant Amount of Weight in the Past Six Months?: No Do You Follow a Special Diet?: No Do You Have Any Trouble Sleeping?: Yes Explanation of Sleeping Difficulties: Pt reports, his sleep was not good.   CCA Employment/Education Employment/Work Situation: Employment / Work Situation Employment situation: Unemployed Where is patient currently employed?: Not assessed. How long has patient been employed?: Not assessed. What is the longest time patient has a held a job?: Not assessed. Where was the patient employed at that time?: Not assessed. Has patient ever been in the [employer]?:  No  Education: Education Is Patient Currently Attending School?: No Last Grade Completed: 11 Did You Graduate From [employer]?: No Did [place]?: No Did You Have An Individualized Education Program (IIEP): No   CCA Family/Childhood History Family and Relationship History: Family history Marital status: Single What is your sexual orientation?: Not assessed. Has your sexual activity been affected by drugs, alcohol, medication, or emotional stress?: Not assessed. Does patient have children?: Yes How many children?: 1  Childhood History:  Childhood History By whom was/is the patient raised?: Other (Comment) (Not assessed.) Additional childhood history information: Not assessed. Description of patient's relationship with caregiver when they were a child: Not assessed. Patient's description of current  relationship with people who raised him/her: Not assessed. How were you disciplined when you got in trouble as a child/adolescent?: Not assessed. Does patient have siblings?: No Description of patient's current relationship with siblings: Not assessed. Did patient suffer any verbal/emotional/physical/sexual abuse as a child?: No Has patient ever been sexually abused/assaulted/raped as an adolescent or adult?: No Witnessed domestic violence?: No Has patient been affected by domestic violence as an adult?:  (NA)  Child/Adolescent Assessment:     CCA Substance Use Alcohol/Drug Use: Alcohol / Drug Use Pain Medications: See MAR Prescriptions: See MAR Over the Counter: See MAR History of alcohol / drug use?: Yes Longest period of sobriety (when/how long): none reported Negative Consequences of Use: Financial,Personal relationships Substance #1 Name of Substance 1: Crack cocaine. 1 - Age of First Use: UTA 1 - Amount (size/oz): Pt reported, "I don't know." 1 - Frequency: Ongoing. 1 - Duration: Ongoing. 1 - Last Use / Amount: UTA 1 - Method of Aquiring: UTA 1- Route of Use: UTA    ASAM's:  Six Dimensions of Multidimensional Assessment  Dimension 1:  Acute Intoxication and/or Withdrawal Potential:   Dimension 1:  Description of individual's past and current experiences of substance use and withdrawal: Patient does not report experiencing any complications with withdrawal symptoms.  Dimension 2:  Biomedical Conditions and Complications:      Dimension 3:  Emotional, Behavioral, or Cognitive Conditions and Complications:  Dimension 3:  Description of emotional, behavioral, or cognitive conditions and complications: Pt is diagnosed with Schizoaffective Disorder. Pt not currently receiving therapy and medication management.  Dimension 4:  Readiness to Change:  Dimension 4:  Description of Readiness to Change criteria: Pt reports, wanting inpatient treatment.  Dimension 5:  Relapse,  Continued use, or Continued Problem Potential:  Dimension 5:  Relapse, continued use, or continued problem potential critiera description: Patient has no significant period of abstinence.  Dimension 6:  Recovery/Living Environment:  Dimension 6:  Recovery/Iiving environment criteria description: Homeless with no supports.  ASAM Severity Score: ASAM's Severity Rating Score: 12  ASAM Recommended Level of Treatment: ASAM Recommended Level of Treatment: Level II Intensive Outpatient Treatment   Substance use Disorder (SUD) Substance Use Disorder (SUD)  Checklist Symptoms of Substance Use: Continued use despite having a persistent/recurrent physical/psychological problem caused/exacerbated by use,Continued use despite persistent or recurrent social, interpersonal problems, caused or exacerbated by use,Social, occupational, recreational activities given up or reduced due to use,Large amounts of time spent to obtain, use or recover from the substance(s)  Recommendations for Services/Supports/Treatments: Recommendations for Services/Supports/Treatments Recommendations For Services/Supports/Treatments: Inpatient Hospitalization  DSM5 Diagnoses: Patient Active Problem List   Diagnosis Date Noted  . Schizoaffective disorder, bipolar type ([place]) 04/26/2021  . Marijuana abuse 04/26/2021  . Polysubstance abuse ([place]) 04/23/2021  . Homelessness 04/23/2021  . Schizophrenia ([place]) 11/19/2020  .  Cocaine abuse ([place]) 02/19/2018  . Cocaine abuse with cocaine-induced mood disorder ([place]) 02/19/2018  . Psychosis ([place]) 11/27/2017  . Cannabis abuse with psychotic disorder, with delusions ([place]) 11/26/2017  . Schizophrenia, unspecified ([place]) 11/26/2017    Referrals to Alternative Service(s): Referred to Alternative Service(s):   Place:   Date:   Time:    Referred to Alternative Service(s):   Place:   Date:   Time:    Referred to Alternative Service(s):   Place:   Date:   Time:    Referred to Alternative Service(s):    Place:   Date:   Time:     [facility], [facility]  Comprehensive Clinical Assessment (CCA) Screening, Triage and Referral Note  05/15/2021 [facility] [id number]  Chief Complaint:  Chief Complaint  Patient presents with  . Suicidal   Visit Diagnosis:   Patient Reported Information How did you hear about [place]? Self   Referral name: [facility]   Referral phone number: No data recorded Whom do you see for routine medical problems? I don't have a doctor   Practice/Facility Name: No data recorded  Practice/Facility Phone Number: No data recorded  Name of Contact: No data recorded  Contact Number: No data recorded  Contact Fax Number: No data recorded  Prescriber Name: No data recorded  Prescriber Address (if known): No data recorded What Is the Reason for Your Visit/Call Today? Hallucinations  How Long Has This Been Causing You Problems? <Week  Have You Recently Been in Any Inpatient Treatment (Hospital/Detox/Crisis [facility]/28-Day Program)? Yes   Name/Location of Program/Hospital:[facility]   How Long Were You There? 7 days   When Were You Discharged? 04/30/2021  Have You Ever Received Services From [employer] Before? Yes   Who Do You See at [facility]? Prior psych hx per chart review  Have You Recently Had Any Thoughts About Hurting Yourself? No   Are You Planning to Commit Suicide/Harm Yourself At This time?  No  Have you Recently Had Thoughts About [place]? No   Explanation: No data recorded Have You Used Any Alcohol or Drugs in the Past 24 Hours? Yes   How Long Ago Did You Use Drugs or Alcohol?  0000 (UTA)   What Did You Use and How Much? unknown  What Do You Feel Would Help You the Most Today? Treatment for Depression or other mood problem  Do You Currently Have a Therapist/Psychiatrist? No   Name of Therapist/Psychiatrist: No data recorded  Have You Been Recently Discharged From Any Office Practice or Programs?  No   Explanation of Discharge From Practice/Program:  No data recorded    CCA Screening Triage Referral Assessment Type of Contact: Tele-Assessment   Is this Initial or Reassessment? Initial Assessment   Date Telepsych consult ordered in [facility]:  05/04/2021   Time Telepsych consult ordered in [facility]:  1509  Patient Reported Information Reviewed? Yes   Patient Left Without Being Seen? No data recorded  Reason for Not Completing Assessment: No data recorded Collateral Involvement: [facility] was able to contact patient's father for collateral information as documented in narrative  Does Patient Have a [place]? No data recorded  Name and Contact of Legal Guardian:  No data recorded If Minor and Not Living with Parent(s), Who has Custody? No data recorded Is CPS involved or ever been involved? Never  Is APS involved or ever been involved? Never  Patient Determined To Be At Risk for Harm To Self or Others Based on  Review of Patient Reported Information or Presenting Complaint? No   Method: No data recorded  Availability of Means: No data recorded  Intent: No data recorded  Notification Required: No data recorded  Additional Information for Danger to Others Potential:  No data recorded  Additional Comments for Danger to Others Potential:  No data recorded  Are There Guns or Other Weapons in Your Home?  No data recorded   Types of Guns/Weapons: No data recorded   Are These Weapons Safely Secured?                              No data recorded   Who Could Verify You Are Able To Have These Secured:    No data recorded Do You Have any Outstanding Charges, Pending Court Dates, Parole/Probation? No data recorded Contacted To Inform of Risk of Harm To Self or Others: Unable to Contact: (NA)  Location of Assessment: [facility] ED  Does Patient Present under Involuntary Commitment? No   IVC Papers Initial File Date: No data recorded  [place] of Residence: [place]  Patient Currently  Receiving the Following Services: Not Receiving Services   Determination of Need: Routine (7 days)   Options For Referral: Outpatient Therapy; Medication Management   [facility], [place], [place], [facility], [facility] Triage Specialist [phone]

## 2021-05-15 NOTE — ED Notes (Signed)
CIWA assessment deferred at this time, pt currently sleeping,

## 2021-05-15 NOTE — ED Triage Notes (Signed)
Pt states that he did some cocaine tonight and since has been having hallucinations telling him to kill himself, no plan, homicidal thoughts to the person who gave him the "bad drugs"

## 2021-05-15 NOTE — ED Notes (Signed)
Pt belonging bag (1 belonging bag) inventoried and placed in small lockers in [employer] [facility].

## 2021-05-15 NOTE — ED Notes (Addendum)
Pt wanded by security. 

## 2021-05-15 NOTE — ED Notes (Signed)
Tele psych machine to bedside for assessment.  

## 2021-05-15 NOTE — ED Provider Notes (Signed)
Patient has been cleared by psychiatry. He feels well. VS are stable. D/c home with [facility] f/u   Sherwood Gambler, MD 05/15/21 1551

## 2021-05-15 NOTE — ED Notes (Signed)
Pt belongings returned pt dressed and verbalized understanding of d/c instructions with follow up care.

## 2021-05-15 NOTE — ED Provider Notes (Addendum)
[facility] EMERGENCY DEPARTMENT Provider Note   CSN: [id number] Arrival date & time: 05/15/21  0426     History Chief Complaint  Patient presents with  . Suicidal    Tyler White is a 25 y.o. male with a history of schizophrenia presents to the Emergency Department complaining of gradual, persistent, progressively worsening hallucinations onset Sunday after "a bad batch of crack."  Patient reports he has been having visual hallucinations of himself harming other people.  Specifically he wishes to harm the drug dealer who gave him the bad durgs.  He denies auditory hallucinations.  Patient also denies suicidal ideation; however reported to triage nurse that the hallucinations telling him to kill himself.  Patient reports the crack makes his hallucinations worse.  Nothing seems to make them better.  The history is provided by the patient and medical records. No language interpreter was used.       Past Medical History:  Diagnosis Date  . Hernia, inguinal, right   . Inguinal hernia    right  . Psychiatric illness   . Schizophrenia [facility])     Patient Active Problem List   Diagnosis Date Noted  . Schizoaffective disorder, bipolar type ([place]) 04/26/2021  . Marijuana abuse 04/26/2021  . Polysubstance abuse ([place]) 04/23/2021  . Homelessness 04/23/2021  . Schizophrenia ([place]) 11/19/2020  . Cocaine abuse ([place]) 02/19/2018  . Cocaine abuse with cocaine-induced mood disorder ([place]) 02/19/2018  . Psychosis ([place]) 11/27/2017  . Cannabis abuse with psychotic disorder, with delusions ([place]) 11/26/2017  . Schizophrenia, unspecified ([place]) 11/26/2017    History reviewed. No pertinent surgical history.     Family History  Problem Relation Age of Onset  . Psychiatric Illness Mother   . Hypertension Sister     Social History   Tobacco Use  . Smoking status: Current Every Day Smoker    Packs/day: 0.50    Years: 5.00    Pack years: 2.50    Types: Cigarettes  .  Smokeless tobacco: Never Used  Vaping Use  . Vaping Use: Never used  Substance Use Topics  . Alcohol use: Yes  . Drug use: Yes    Types: Marijuana, Cocaine    Home Medications Prior to Admission medications   Medication Sig Start Date End Date Taking? Authorizing Provider  benztropine (COGENTIN) 0.5 MG tablet Take 1 tablet (0.5 mg total) by mouth 2 (two) times daily as needed for tremors. 04/30/21   Ethelene Hal, NP  gabapentin (NEURONTIN) 400 MG capsule Take 1 capsule (400 mg total) by mouth 3 (three) times daily. 04/30/21   Ethelene Hal, NP  hydrOXYzine (ATARAX/VISTARIL) 25 MG tablet Take 1 tablet (25 mg total) by mouth 3 (three) times daily as needed for anxiety. 04/30/21   Ethelene Hal, NP  paliperidone (INVEGA SUSTENNA) 156 MG/ML SUSY injection Inject 1 mL (156 mg total) into the muscle once for 1 dose. Next dose is due 05/27/2021 04/30/21 04/30/21  Ethelene Hal, NP  risperiDONE (RISPERDAL M-TABS) 2 MG disintegrating tablet Take 1 tablet (2 mg total) by mouth at bedtime. 04/30/21   Ethelene Hal, NP  traZODone (DESYREL) 50 MG tablet Take 1 tablet (50 mg total) by mouth at bedtime as needed for sleep. 04/30/21   Ethelene Hal, NP  Vitamin D, Ergocalciferol, (DRISDOL) 1.25 MG (50000 UNIT) CAPS capsule Take 1 capsule (50,000 Units total) by mouth every 7 (seven) days. Next dose due on 05/02/2021 05/02/21   Ethelene Hal, NP    Allergies  Patient has no known allergies.  Review of Systems   Review of Systems  Constitutional: Negative for appetite change, diaphoresis, fatigue, fever and unexpected weight change.  HENT: Negative for mouth sores.   Eyes: Negative for visual disturbance.  Respiratory: Negative for cough, chest tightness, shortness of breath and wheezing.   Cardiovascular: Negative for chest pain.  Gastrointestinal: Negative for abdominal pain, constipation, diarrhea, nausea and vomiting.  Endocrine: Negative for polydipsia,  polyphagia and polyuria.  Genitourinary: Negative for dysuria, frequency, hematuria and urgency.  Musculoskeletal: Negative for back pain and neck stiffness.  Skin: Negative for rash.  Allergic/Immunologic: Negative for immunocompromised state.  Neurological: Negative for syncope, light-headedness and headaches.  Hematological: Does not bruise/bleed easily.  Psychiatric/Behavioral: Positive for hallucinations and suicidal ideas. Negative for sleep disturbance. The patient is not nervous/anxious.     Physical Exam Updated Vital Signs BP (!) 142/89 (BP Location: Left Arm)   Pulse 62   Temp 98.8 F (37.1 C) (Oral)   Resp 19   SpO2 99%   Physical Exam Vitals and nursing note reviewed.  Constitutional:      General: He is not in acute distress.    Appearance: He is not diaphoretic.  HENT:     Head: Normocephalic.  Eyes:     General: No scleral icterus.    Conjunctiva/sclera: Conjunctivae normal.  Cardiovascular:     Rate and Rhythm: Normal rate and regular rhythm.     Pulses: Normal pulses.          Radial pulses are 2+ on the right side and 2+ on the left side.  Pulmonary:     Effort: No tachypnea, accessory muscle usage, prolonged expiration, respiratory distress or retractions.     Breath sounds: No stridor.     Comments: Equal chest rise. No increased work of breathing. Abdominal:     General: There is no distension.     Palpations: Abdomen is soft.     Tenderness: There is no abdominal tenderness. There is no guarding or rebound.  Musculoskeletal:     Cervical back: Normal range of motion.     Comments: Moves all extremities equally and without difficulty.  Skin:    General: Skin is warm and dry.     Capillary Refill: Capillary refill takes less than 2 seconds.  Neurological:     Mental Status: He is alert.     GCS: GCS eye subscore is 4. GCS verbal subscore is 5. GCS motor subscore is 6.     Comments: Speech is clear and goal oriented.  Psychiatric:         Attention and Perception: He perceives visual hallucinations. He does not perceive auditory hallucinations.        Mood and Affect: Mood is anxious.        Thought Content: Thought content includes homicidal and suicidal ideation.     ED Results / Procedures / Treatments   Labs (all labs ordered are listed, but only abnormal results are displayed) Labs Reviewed  COMPREHENSIVE METABOLIC PANEL - Abnormal; Notable for the following components:      Result Value   Total Protein 6.3 (*)    All other components within normal limits  SALICYLATE LEVEL - Abnormal; Notable for the following components:   Salicylate Lvl <2.0 (*)    All other components within normal limits  ACETAMINOPHEN LEVEL - Abnormal; Notable for the following components:   Acetaminophen (Tylenol), Serum <10 (*)    All other components within normal limits  CBC - Abnormal; Notable for the following components:   Hemoglobin 12.5 (*)    HCT 37.9 (*)    All other components within normal limits  RESP PANEL BY RT-PCR (FLU A&B, COVID) ARPGX2  ETHANOL  RAPID URINE DRUG SCREEN, HOSP PERFORMED    Procedures Procedures   Medications Ordered in ED Medications  LORazepam (ATIVAN) injection 0-4 mg (has no administration in time range)    Or  LORazepam (ATIVAN) tablet 0-4 mg (has no administration in time range)  LORazepam (ATIVAN) injection 0-4 mg (has no administration in time range)    Or  LORazepam (ATIVAN) tablet 0-4 mg (has no administration in time range)  thiamine tablet 100 mg (has no administration in time range)    Or  thiamine (B-1) injection 100 mg (has no administration in time range)    ED Course  I have reviewed the triage vital signs and the nursing notes.  Pertinent labs & imaging results that were available during my care of the patient were reviewed by me and considered in my medical decision making (see chart for details).    MDM Rules/Calculators/A&P                           Presents to the  emergency department with homicidal ideation and potentially suicidal ideation.  He reports this to triage but denies it to me.  Patient also with visual hallucinations and possibly auditory hallucinations after cocaine usage.  Mild anemia with a hemoglobin of 12.5.  Labs otherwise reassuring.  Will have patient evaluated by TTS.  No medical emergency at this time which requires immediate intervention.  6:44 AM Pt disposition discussed with Delrae Sawyers, [facility].  [name], PA-C requests pt be observed and re-evaluated in the morning by Psyc.  [place] is currently at capacity.   Final Clinical Impression(s) / ED Diagnoses Final diagnoses:  Hallucinations    Rx / DC Orders ED Discharge Orders    None       Deondrea Markos, Gwenlyn Perking 05/15/21 1157    Ezequiel Essex, MD 05/15/21 2620    Abigail Butts, PA-C 05/15/21 3559    Ezequiel Essex, MD 05/15/21 [phone]

## 2021-05-15 NOTE — BH Assessment (Signed)
Margarette Asal, RN to call clinician when the TTS cart is in the pt's room.      Vertell Novak, [place], [facility], [facility] Triage Specialist [phone]

## 2021-05-15 NOTE — ED Notes (Signed)
Pt given [place] sandwich bag and soda per RN order.

## 2021-05-15 NOTE — Consult Note (Signed)
  Tele psych Assessment  Reason for Consult:  Psychosis Referring Physician:  Muthersbaugh, Gwenlyn Perking Location of Patient: [facility] ED Location of Provider:  Other: [place], 25 y.o., male patient admitted to [facility] ED after present with complaints that after doing cocaine he was having hallucinations of seeing himself hurt others.    Patient seen via tele health by TTS and this provider; chart reviewed and consulted with Dr. Dwyane Dee on 05/15/21.  On evaluation [name] asked what brought him to the emergency room and he stated "I was seeing stuff and thinking I was in danger.  At this time patient denies suicidal/self-harm/homicidal ideation.  Patient states he continues to see shadows but denies auditory hallucination.  He also states that he is paranoid that he is not safe on the streets.  "I just need to get some rest."  Patient states he is homeless and unemployed.  States he missed his first follow up appointment at [facility] but will make it to the second one on May 30 th for medication management.  Patient reports he is still taking his medications that he was discharged with.  "They gave me some samples"    During evaluation Sekai Gitlin is sitting up in bed in no acute distress.  He is alert/oriented x 4; calm/cooperative; and mood congruent with affect.  Patient is speaking in a clear tone at moderate volume, and normal pace; with good eye contact.  His thought process is coherent and relevant; There is no indication that he is currently responding to internal/external stimuli or experiencing delusional thought content at this time; but patient stating he is seeing shadows.  Patient denies suicidal/self-harm/homicidal ideation.  Patient continues to endorse visual hallucinations (seeing shadows) and paranoia (not feeling safe from others on the street) related to being homeless.   Patient has remained calm throughout assessment and has answered questions appropriately.      For detailed note see TTS tele assessment note  Recommendations:  Outpatient psychiatric services.  Substance use services  Disposition:  Patient is psychiatrically cleared  No evidence of imminent risk to self or others at present.   Patient does not meet criteria for psychiatric inpatient admission. Supportive therapy provided about ongoing stressors. Refer to IOP. Discussed crisis plan, support from social network, calling 911, coming to the Emergency Department, and calling Suicide Hotline.   This service was provided via telemedicine using a 2-way, interactive audio and video technology.  Names of all persons participating in this telemedicine service and their role in this encounter. Name: [name] Role: NP  Name: Dr. Hampton Abbot Role: Psychiatrist  Name: [name] Role: Patient  Name: Festus Aloe, RN Role: Patients nurse sent a secure message informing of patients disposition and recommendations.     [name], NP

## 2021-06-05 ENCOUNTER — Emergency Department (HOSPITAL_COMMUNITY)
Admission: EM | Admit: 2021-06-05 | Discharge: 2021-06-05 | Disposition: A | Discharge status: Home / Self Care | Payer: Medicaid Other | Attending: Emergency Medicine | Admitting: Emergency Medicine

## 2021-06-05 ENCOUNTER — Other Ambulatory Visit: Payer: Self-pay

## 2021-06-05 DIAGNOSIS — F17210 Nicotine dependence, cigarettes, uncomplicated: Secondary | ICD-10-CM | POA: Diagnosis not present

## 2021-06-05 DIAGNOSIS — R440 Auditory hallucinations: Secondary | ICD-10-CM | POA: Diagnosis present

## 2021-06-05 DIAGNOSIS — F1420 Cocaine dependence, uncomplicated: Secondary | ICD-10-CM | POA: Diagnosis not present

## 2021-06-05 DIAGNOSIS — R443 Hallucinations, unspecified: Secondary | ICD-10-CM

## 2021-06-05 DIAGNOSIS — F250 Schizoaffective disorder, bipolar type: Secondary | ICD-10-CM | POA: Diagnosis not present

## 2021-06-05 LAB — COMPREHENSIVE METABOLIC PANEL
ALT: 15 U/L (ref 0–44)
AST: 25 U/L (ref 15–41)
Albumin: 3.9 g/dL (ref 3.5–5.0)
Alkaline Phosphatase: 47 U/L (ref 38–126)
Anion gap: 6 (ref 5–15)
BUN: 20 mg/dL (ref 6–20)
CO2: 26 mmol/L (ref 22–32)
Calcium: 9.4 mg/dL (ref 8.9–10.3)
Chloride: 104 mmol/L (ref 98–111)
Creatinine, Ser: 0.98 mg/dL (ref 0.61–1.24)
GFR, Estimated: >60 mL/min (ref >60)
Glucose, Bld: 101 mg/dL — ABNORMAL HIGH (ref 70–99)
Potassium: 3.7 mmol/L (ref 3.5–5.1)
Sodium: 136 mmol/L (ref 135–145)
Total Bilirubin: 0.3 mg/dL (ref 0.3–1.2)
Total Protein: 6.6 g/dL (ref 6.5–8.1)

## 2021-06-05 LAB — CBC
HCT: 39 % (ref 39.0–52.0)
Hemoglobin: 13.1 g/dL (ref 13.0–17.0)
MCH: 28.4 pg (ref 26.0–34.0)
MCHC: 33.6 g/dL (ref 30.0–36.0)
MCV: 84.6 fL (ref 80.0–100.0)
Platelets: 285 10*3/uL (ref 150–400)
RBC: 4.61 MIL/uL (ref 4.22–5.81)
RDW: 13.6 % (ref 11.5–15.5)
WBC: 6.3 10*3/uL (ref 4.0–10.5)
nRBC: 0 % (ref 0.0–0.2)

## 2021-06-05 LAB — ACETAMINOPHEN LEVEL: Acetaminophen (Tylenol), Serum: <10 ug/mL — ABNORMAL LOW (ref 10–30)

## 2021-06-05 LAB — ETHANOL: Alcohol, Ethyl (B): <10 mg/dL (ref <10)

## 2021-06-05 LAB — SALICYLATE LEVEL: Salicylate Lvl: <7 mg/dL — ABNORMAL LOW (ref 7.0–30.0)

## 2021-06-05 NOTE — ED Triage Notes (Signed)
Pt reports A/V H. On meds for same. Denies SI/HI.

## 2021-06-05 NOTE — BH Assessment (Addendum)
Comprehensive Clinical Assessment (CCA) Note  06/05/2021 Tyler White [id number]   Disposition: Per Thomes Lolling, PMHNP patient does not meet in patient care criteria and is psych cleared. This counselor offered patient transfer to [facility] for observation and stabilization but he declined. Counselor explained he is to come to [facility] on 6/8 for medication management walk in. No sitter recommended for suicide safety.  The patient demonstrates the following risk factors for suicide: Chronic risk factors for suicide include: psychiatric disorder of schizoaffective disorder, substance use disorder and demographic factors (male, >50 y/o). Acute risk factors for suicide include: unemployment, social withdrawal/isolation and recent discharge from inpatient psychiatry. Protective factors for this patient include: NA. Considering these factors, the overall suicide risk at this point appears to be low. Patient is appropriate for outpatient follow up.  [place] ED from 06/05/2021 in [place] ED from 05/15/2021 in [place] ED from 05/04/2021 in [place] No Risk High Risk Low Risk     Patient is a 25 year old male presenting voluntarily to [facility] ED reporting hallucinations. He is well known to ED and [facility] service line due to multiple recent encounters. Patient was last discharged from [facility] on 5/2. Patient denies SI/HI. He reports VH of "small people, like a sign." He endorses AH of voices stating, "You are hallucinating. He denies HI/AVH. He reports daily crack cocaine use. Patient states when he was d/c from [facility] he was given samples of his medications, which he continues to take as prescribed. He did receive an Invega injection on 5/2 and was due for another on 5/29 but states he missed his appointment. This counselor offered patient be transported to [facility] where he could stay  for observation and receive injection, but he declined. He states he does not have any natural supports for TTS to contact for collateral information.  Chief Complaint:  Chief Complaint  Patient presents with  . Hallucinations   Visit Diagnosis: Schizoaffective disorder, bipolar type    Cocaine use disorder, severe   CCA Screening, Triage and Referral (STR)  Patient Reported Information How did you hear about [place]? Self  Referral name: [facility] [employer]  Referral phone number: No data recorded  Whom do you see for routine medical problems? I don't have a doctor  Practice/Facility Name: No data recorded Practice/Facility Phone Number: No data recorded Name of Contact: No data recorded Contact Number: No data recorded Contact Fax Number: No data recorded Prescriber Name: No data recorded Prescriber Address (if known): No data recorded  What Is the Reason for Your Visit/Call Today? hallucinations  How Long Has This Been Causing You Problems? 1 wk - 1 month  What Do You Feel Would Help You the Most Today? Medication(s)   Have You Recently Been in Any Inpatient Treatment (Hospital/Detox/Crisis Center/28-Day Program)? Yes  Name/Location of Program/Hospital:[facility]  How Long Were You There? 1 week  When Were You Discharged? 04/30/2021   Have You Ever Received Services From [employer] Before? Yes  Who [facility] You [facility] at [facility]   Have You Recently Had Any Thoughts About Hurting Yourself? No  Are You Planning to Commit Suicide/Harm Yourself At This time? No   Have you Recently Had Thoughts About [place]? No  Explanation: No data recorded  Have You Used Any Alcohol or Drugs in the Past 24 Hours? Yes  How Long Ago Did You Use Drugs  or Alcohol? 0000 (UTA)  What Did You Use and How Much? unknown amount of crack cocaine last night   Do You Currently Have a Therapist/Psychiatrist? No  Name of Therapist/Psychiatrist: No data  recorded  Have You Been Recently Discharged From Any Office Practice or Programs? No  Explanation of Discharge From Practice/Program: No data recorded    CCA Screening Triage Referral Assessment Type of Contact: Tele-Assessment  Is this Initial or Reassessment? Initial Assessment  Date Telepsych consult ordered in [facility]:  06/05/2021  Time Telepsych consult ordered in [facility]:  [place]   Patient Reported Information Reviewed? Yes  Patient Left Without Being Seen? No data recorded Reason for Not Completing Assessment: No data recorded  Collateral Involvement: TTS was able to contact patient's father for collateral information as documented in narrative   Does Patient Have a Commerce? No data recorded Name and Contact of Legal Guardian: No data recorded If Minor and Not Living with Parent(s), Who has Custody? No data recorded Is CPS involved or ever been involved? Never  Is APS involved or ever been involved? Never   Patient Determined To Be At Risk for Harm To Self or Others Based on Review of Patient Reported Information or Presenting Complaint? No  Method: No data recorded Availability of Means: No data recorded Intent: No data recorded Notification Required: No data recorded Additional Information for Danger to Others Potential: No data recorded Additional Comments for Danger to Others Potential: No data recorded Are There Guns or Other Weapons in Your Home? No data recorded Types of Guns/Weapons: No data recorded Are These Weapons Safely Secured?                            No data recorded Who Could Verify You Are Able To Have These Secured: No data recorded Do You Have any Outstanding Charges, Pending Court Dates, Parole/Probation? No data recorded Contacted To Inform of Risk of Harm To Self or Others: Unable to Contact: (NA)   Location of Assessment: [facility]   Does Patient Present under Involuntary Commitment? No  IVC Papers Initial File Date: No  data recorded  [place] of Residence: [place]   Patient Currently Receiving the Following Services: Not Receiving Services   Determination of Need: Routine (7 days)   Options For Referral: [facility]; Medication Management; Outpatient Therapy     CCA Biopsychosocial Intake/Chief Complaint:  NA  Current Symptoms/Problems: NA   Patient Reported Schizophrenia/Schizoaffective Diagnosis in Past: Yes   Strengths: Not assessed.  Preferences: Not assessed.  Abilities: Not assessed.   Type of Services Patient Feels are Needed: NA   Initial Clinical Notes/Concerns: NA   Mental Health Symptoms Depression:  Difficulty Concentrating; Sleep (too much or little); Irritability; Tearfulness; Fatigue; Hopelessness; Worthlessness   Duration of Depressive symptoms: Greater than two weeks   Mania:  N/A   Anxiety:   Difficulty concentrating; Sleep   Psychosis:  Hallucinations; Delusions   Duration of Psychotic symptoms: Greater than six months   Trauma:  None   Obsessions:  None   Compulsions:  None   Inattention:  None   Hyperactivity/Impulsivity:  N/A   Oppositional/Defiant Behaviors:  None   Emotional Irregularity:  None   Other Mood/Personality Symptoms:  NA    Mental Status Exam Appearance and self-care  Stature:  Average   Weight:  Average weight   Clothing:  -- (Pt in scrubs.)   Grooming:  Normal (scrubs)   Cosmetic  use:  None   Posture/gait:  Slumped   Motor activity:  Slowed   Sensorium  Attention:  Normal   Concentration:  Normal   Orientation:  Person; Place; Situation; Time   Recall/memory:  Normal   Affect and Mood  Affect:  Flat   Mood:  Dysphoric   Relating  Eye contact:  Normal   Facial expression:  Constricted (Sleep.)   Attitude toward examiner:  Guarded   Thought and Language  Speech flow: Slow   Thought content:  Appropriate to Mood and Circumstances   Preoccupation:  None   Hallucinations:  Auditory;  Visual   Organization:  No data recorded  [employer] of Knowledge:  Average   Intelligence:  Average   Abstraction:  Normal   Judgement:  Impaired   Reality Testing:  Realistic   Insight:  Lacking   Decision Making:  Only simple   Social Functioning  Social Maturity:  Isolates   Social Judgement:  "Games developer"   Stress  Stressors:  Housing; Museum/gallery curator; Illness   Coping Ability:  Deficient supports   Skill Deficits:  Self-care; Decision making   Supports:  Support needed     Religion: Religion/Spirituality Are You A Religious Person?: No How Might This Affect Treatment?: NA  Leisure/Recreation: Leisure / Recreation Do You Have Hobbies?: No  Exercise/Diet: Exercise/Diet Do You Exercise?: No Have You Gained or Lost A Significant Amount of Weight in the Past Six Months?: No Do You Follow a Special Diet?: No Do You Have Any Trouble Sleeping?: Yes Explanation of Sleeping Difficulties: Pt reports, his sleep was not good.   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 by current illness: No What is the longest time patient has a held a job?: N/A Where was the patient employed at that time?: N/A Has patient ever been in the [employer]?: No  Education: Education Last Grade Completed: 11 Did Teacher, adult education From [employer]?: No Did Physicist, medical?: No Did Heritage manager?: No Did You Have Any Special Interests In School?: History Did You Have An Individualized Education Program (IIEP): No Did You Have Any Difficulty At [employer]?: No   CCA Family/Childhood History Family and Relationship History: Family history Marital status: Single Are you sexually active?:  (Unable to assess) What is your sexual orientation?: Heterosexual Has your sexual activity been affected by drugs, alcohol, medication, or emotional stress?: NA Does  patient have children?: Yes How many children?: 1 How is patient's relationship with their children?: Pt reports having a 82 year old daughter who lives with her mother  Childhood History:  Childhood History By whom was/is the patient raised?: Mother Additional childhood history information: NA Description of patient's relationship with caregiver when they were a child: Patient was closest to his mother growing up, but later went to live with his father Patient's description of current relationship with people who raised him/her: "Okay" How were you disciplined when you got in trouble as a child/adolescent?: unable to assess Does patient have siblings?: Yes Description of patient's current relationship with siblings: "Fine" Did patient suffer any verbal/emotional/physical/sexual abuse as a child?: No Did patient suffer from severe childhood neglect?: No Has patient ever been sexually abused/assaulted/raped as an adolescent or adult?: No Was the patient ever a victim of a crime or a disaster?: No Witnessed domestic violence?: No Has patient been affected by domestic violence as an adult?: No  Child/Adolescent Assessment:  CCA Substance Use Alcohol/Drug Use: Alcohol / Drug Use Pain Medications: See MAR Prescriptions: See MAR Over the Counter: See MAR History of alcohol / drug use?: Yes Longest period of sobriety (when/how long): none reported Negative Consequences of Use: Financial,Personal relationships Substance #1 Name of Substance 1: Crack cocaine. 1 - Age of First Use: UTA 1 - Amount (size/oz): Pt reported, "I don't know." 1 - Frequency: daily 1 - Duration: Ongoing. 1 - Last Use / Amount: 6/6 unknown amount 1 - Method of Aquiring: purchased 1- Route of Use: smoking                       ASAM's:  Six Dimensions of Multidimensional Assessment  Dimension 1:  Acute Intoxication and/or Withdrawal Potential:   Dimension 1:  Description of individual's past and  current experiences of substance use and withdrawal: Patient does not report experiencing any complications with withdrawal symptoms.  Dimension 2:  Biomedical Conditions and Complications:      Dimension 3:  Emotional, Behavioral, or Cognitive Conditions and Complications:  Dimension 3:  Description of emotional, behavioral, or cognitive conditions and complications: Pt is diagnosed with Schizoaffective Disorder. Pt not currently receiving therapy and medication management.  Dimension 4:  Readiness to Change:  Dimension 4:  Description of Readiness to Change criteria: Pt reports, wanting inpatient treatment.  Dimension 5:  Relapse, Continued use, or Continued Problem Potential:  Dimension 5:  Relapse, continued use, or continued problem potential critiera description: Patient has no significant period of abstinence.  Dimension 6:  Recovery/Living Environment:  Dimension 6:  Recovery/Iiving environment criteria description: Homeless with no supports.  ASAM Severity Score: ASAM's Severity Rating Score: 12  ASAM Recommended Level of Treatment: ASAM Recommended Level of Treatment: Level II Intensive Outpatient Treatment   Substance use Disorder (SUD) Substance Use Disorder (SUD)  Checklist Symptoms of Substance Use: Continued use despite having a persistent/recurrent physical/psychological problem caused/exacerbated by use,Continued use despite persistent or recurrent social, interpersonal problems, caused or exacerbated by use,Social, occupational, recreational activities given up or reduced due to use,Large amounts of time spent to obtain, use or recover from the substance(s)  Recommendations for Services/Supports/Treatments: Recommendations for Services/Supports/Treatments Recommendations For Services/Supports/Treatments: Inpatient Hospitalization  DSM5 Diagnoses: Patient Active Problem List   Diagnosis Date Noted  . Schizoaffective disorder, bipolar type ([place]) 04/26/2021  . Marijuana abuse  04/26/2021  . Polysubstance abuse ([place]) 04/23/2021  . Homelessness 04/23/2021  . Schizophrenia ([place]) 11/19/2020  . Cocaine abuse ([place]) 02/19/2018  . Cocaine abuse with cocaine-induced mood disorder ([place]) 02/19/2018  . Psychosis ([place]) 11/27/2017  . Cannabis abuse with psychotic disorder, with delusions ([place]) 11/26/2017  . Schizophrenia, unspecified ([place]) 11/26/2017    Patient Centered Plan: Patient is on the following Treatment Plan(s):    Referrals to Alternative Service(s): Referred to Alternative Service(s):   Place:   Date:   Time:    Referred to Alternative Service(s):   Place:   Date:   Time:    Referred to Alternative Service(s):   Place:   Date:   Time:    Referred to Alternative Service(s):   Place:   Date:   Time:     Orvis Brill, LCSW

## 2021-06-05 NOTE — ED Provider Notes (Signed)
[facility] EMERGENCY DEPARTMENT Provider Note   CSN: [id number] Arrival date & time: 06/05/21  0857     History Chief Complaint  Patient presents with  . Hallucinations    Tyler White is a 25 y.o. male past medical history of schizophrenia, polysubstance abuse, homelessness, presenting to the emergency department voluntarily with request to see behavioral health.  He states he came by [employer].  States his hallucinations have been ongoing with auditory and visual hallucinations.  States they are more neutral, they are not negative hallucinations.  He is not feeling suicidal or homicidal though he states he is feeling "helpless."  Last used cocaine yesterday, he smokes it.  States he has not used alcohol in a little bit.  No medical complaints.  He does not eat much when he is on the streets, mostly eats when he is in the hospital.  States he has been taking the Risperdal although thinks he needs a medication change as it is not helping his hallucinations.  He is taking the samples that behavioral health gave him last time.  The history is provided by the patient.       Past Medical History:  Diagnosis Date  . Hernia, inguinal, right   . Inguinal hernia    right  . Psychiatric illness   . Schizophrenia [facility])     Patient Active Problem List   Diagnosis Date Noted  . Schizoaffective disorder, bipolar type ([place]) 04/26/2021  . Marijuana abuse 04/26/2021  . Polysubstance abuse ([place]) 04/23/2021  . Homelessness 04/23/2021  . Schizophrenia ([place]) 11/19/2020  . Cocaine abuse ([place]) 02/19/2018  . Cocaine abuse with cocaine-induced mood disorder ([place]) 02/19/2018  . Psychosis ([place]) 11/27/2017  . Cannabis abuse with psychotic disorder, with delusions ([place]) 11/26/2017  . Schizophrenia, unspecified ([place]) 11/26/2017    No past surgical history on file.     Family History  Problem Relation Age of Onset  . Psychiatric Illness Mother   . Hypertension Sister      Social History   Tobacco Use  . Smoking status: Current Every Day Smoker    Packs/day: 0.50    Years: 5.00    Pack years: 2.50    Types: Cigarettes  . Smokeless tobacco: Never Used  Vaping Use  . Vaping Use: Never used  Substance Use Topics  . Alcohol use: Yes  . Drug use: Yes    Types: Marijuana, Cocaine    Home Medications Prior to Admission medications   Medication Sig Start Date End Date Taking? Authorizing Provider  benztropine (COGENTIN) 0.5 MG tablet Take 1 tablet (0.5 mg total) by mouth 2 (two) times daily as needed for tremors. 04/30/21   Ethelene Hal, NP  gabapentin (NEURONTIN) 400 MG capsule Take 1 capsule (400 mg total) by mouth 3 (three) times daily. 04/30/21   Ethelene Hal, NP  hydrOXYzine (ATARAX/VISTARIL) 25 MG tablet Take 1 tablet (25 mg total) by mouth 3 (three) times daily as needed for anxiety. 04/30/21   Ethelene Hal, NP  paliperidone (INVEGA SUSTENNA) 156 MG/ML SUSY injection Inject 1 mL (156 mg total) into the muscle once for 1 dose. Next dose is due 05/27/2021 04/30/21 04/30/21  Ethelene Hal, NP  risperiDONE (RISPERDAL M-TABS) 2 MG disintegrating tablet Take 1 tablet (2 mg total) by mouth at bedtime. 04/30/21   Ethelene Hal, NP  traZODone (DESYREL) 50 MG tablet Take 1 tablet (50 mg total) by mouth at bedtime as needed for sleep. 04/30/21   Jinny Blossom  Toribio Harbour, NP  Vitamin D, Ergocalciferol, (DRISDOL) 1.25 MG (50000 UNIT) CAPS capsule Take 1 capsule (50,000 Units total) by mouth every 7 (seven) days. Next dose due on 05/02/2021 05/02/21   Ethelene Hal, NP    Allergies    Patient has no known allergies.  Review of Systems   Review of Systems  Psychiatric/Behavioral: Positive for hallucinations.  All other systems reviewed and are negative.   Physical Exam Updated Vital Signs BP 114/77   Pulse 76   Temp (!) 97.5 F (36.4 C) (Oral)   Resp 16   SpO2 100%   Physical Exam Vitals and nursing note reviewed.   Constitutional:      General: He is not in acute distress.    Appearance: He is well-developed. He is not ill-appearing.  HENT:     Head: Normocephalic and atraumatic.  Eyes:     Conjunctiva/sclera: Conjunctivae normal.  Cardiovascular:     Rate and Rhythm: Normal rate.  Pulmonary:     Effort: Pulmonary effort is normal.  Abdominal:     Palpations: Abdomen is soft.  Skin:    General: Skin is warm.  Neurological:     Mental Status: He is alert.  Psychiatric:        Attention and Perception: Attention normal.        Mood and Affect: Mood is depressed. Affect is flat.        Speech: Speech normal.        Behavior: Behavior normal. Behavior is cooperative.     ED Results / Procedures / Treatments   Labs (all labs ordered are listed, but only abnormal results are displayed) Labs Reviewed  COMPREHENSIVE METABOLIC PANEL - Abnormal; Notable for the following components:      Result Value   Glucose, Bld 101 (*)    All other components within normal limits  SALICYLATE LEVEL - Abnormal; Notable for the following components:   Salicylate Lvl <1.4 (*)    All other components within normal limits  ACETAMINOPHEN LEVEL - Abnormal; Notable for the following components:   Acetaminophen (Tylenol), Serum <10 (*)    All other components within normal limits  ETHANOL  CBC  RAPID URINE DRUG SCREEN, HOSP PERFORMED    EKG None  Radiology No results found.  Procedures Procedures   Medications Ordered in ED Medications - No data to display  ED Course  I have reviewed the triage vital signs and the nursing notes.  Pertinent labs & imaging results that were available during my care of the patient were reviewed by me and considered in my medical decision making (see chart for details).  Clinical Course as of 06/05/21 1718  Tue Jun 05, 2021  1619 Unable to reach [place] with [facility]. Contacted Thomes Lolling, states TTS is available at this time, patient may just need TTS consult  and potential discharge from here. RN made aware to move TTS cart to patient's room for assessment. [JR]    Clinical Course User Index [JR] Ysidra Sopher, [place] N, PA-C   MDM Rules/Calculators/A&P                          Patient with history of schizophrenia, presenting for evaluation of AVH.  He is voluntary, requesting medication assistance by behavioral health.  He is homeless.  Also with history of polysubstance abuse, last used cocaine yesterday.  He is medically cleared.  Contacted behavioral health urgent care APP with hopes for transfer for  stabilization and medication management.  Psychiatry evaluated patient, offered him transfer to [facility] for medication and stabilization, however patient declined.  He is psychiatrically cleared, does not meet inpatient criteria per behavioral health documentation.  They recommend he go to behavioral health tomorrow morning for psychiatric medication, Invega.  Patient states he is able to get there tomorrow morning and would prefer to do this.  He remained stable on evaluation, with appropriate behavior and mentation.  He is discharged in no distress  Discussed results, findings, treatment and follow up. Patient advised of return precautions. Patient verbalized understanding and agreed with plan.  Final Clinical Impression(s) / ED Diagnoses Final diagnoses:  Hallucinations    Rx / DC Orders ED Discharge Orders    None       Slaton Reaser, [place] N, PA-C 06/05/21 1718    Noemi Chapel, MD 06/06/21 [phone]

## 2021-06-05 NOTE — Discharge Instructions (Addendum)
Please go to the behavioral health center from 8am-11am tomorrow for medication management.

## 2021-06-05 NOTE — ED Provider Notes (Signed)
Emergency Medicine Provider Triage Evaluation Note  Tyler White , a 25 y.o. male  was evaluated in triage.  Pt complains of patient more ill auditory hallucinations.  Denies suicidal or homicidal ideations. Reports last time taking his medicine was yesterday.   Review of Systems  Positive: Visual hallucinations, auditory sensations. Negative: Ideations, homicidal ideations  Physical Exam  BP (!) 114/96 (BP Location: Right Arm)   Pulse 72   Temp 98.8 F (37.1 C) (Oral)   Resp 16   SpO2 100%  Gen:   Awake, no distress   Resp:  Normal effort.  Lungs clear to auscultation bilaterally. MSK:   Moves extremities without difficulty  Other:  Answers questions but appears to be responding internal stimuli.  Medical Decision Making  Medically screening exam initiated at 9:02 AM.  Appropriate orders placed.  Tyler White was informed that the remainder of the evaluation will be completed by another provider, this initial triage assessment does not replace that evaluation, and the importance of remaining in the ED until their evaluation is complete.  Return patient has a history of schizophrenia and substance abuse. Not any medicine that requires serum level checks per chart review. Basic labs ordered.    Tyler Raring, PA-C 06/05/21 7915    Tyler Shanks, MD 06/12/21 2136

## 2021-06-05 NOTE — BH Assessment (Signed)
TTS consult complete. Patient requesting d/c. Discussed with Thomes Lolling, PMHNP who recommends psych clearance. She recommends patient access [facility] on 6/8 for Invega injection. Zoe, RN notified.

## 2021-06-05 NOTE — ED Notes (Signed)
Belongings placed in locker #5

## 2021-06-06 ENCOUNTER — Other Ambulatory Visit: Payer: Self-pay

## 2021-06-06 ENCOUNTER — Ambulatory Visit (HOSPITAL_COMMUNITY)
Admission: EM | Admit: 2021-06-06 | Discharge: 2021-06-06 | Disposition: A | Discharge status: Home / Self Care | Payer: Medicaid Other | Attending: Psychiatry | Admitting: Psychiatry

## 2021-06-06 ENCOUNTER — Other Ambulatory Visit (HOSPITAL_COMMUNITY): Payer: Self-pay | Admitting: *Deleted

## 2021-06-06 DIAGNOSIS — F251 Schizoaffective disorder, depressive type: Secondary | ICD-10-CM

## 2021-06-06 DIAGNOSIS — F250 Schizoaffective disorder, bipolar type: Secondary | ICD-10-CM | POA: Diagnosis present

## 2021-06-06 DIAGNOSIS — Z760 Encounter for issue of repeat prescription: Secondary | ICD-10-CM | POA: Insufficient documentation

## 2021-06-06 DIAGNOSIS — T43596A Underdosing of other antipsychotics and neuroleptics, initial encounter: Secondary | ICD-10-CM | POA: Insufficient documentation

## 2021-06-06 MED ORDER — PALIPERIDONE PALMITATE ER 156 MG/ML IM SUSY
156.0000 mg | PREFILLED_SYRINGE | INTRAMUSCULAR | Status: DC
  Administered 2021-06-06: 156 mg via INTRAMUSCULAR

## 2021-06-06 NOTE — Discharge Instructions (Addendum)
Take all medications as prescribed. Keep all follow-up appointments as scheduled.  Do not consume alcohol or use illegal drugs while on prescription medications. Report any adverse effects from your medications to your primary care provider promptly.  In the event of recurrent symptoms or worsening symptoms, call 911, a crisis hotline, or go to the nearest emergency department for evaluation.   

## 2021-06-06 NOTE — Progress Notes (Signed)
Patient recv  Invega sustenna 156mg  Left deltoid. Tolerated injection well.

## 2021-06-06 NOTE — BH Assessment (Signed)
Patient presents to [facility] outpatient and was advised to come to Urgent care for Invega shot due to no appointment being made after d/c from [facility]. Patient reports missing his appointment for his Invega shot and wants to get his shot today . Patient denies SI/ HI/ but states he is hearing voices as always . Patient is routine

## 2021-06-06 NOTE — ED Notes (Signed)
Pt discharged in no acute distress. Resources in hand provided on AVS. Verbalized understanding of instructions. Safety maintained.

## 2021-06-06 NOTE — ED Provider Notes (Addendum)
Behavioral Health Urgent Care Medical Screening Exam  Patient Name: Tyler White MRN: [id number] Date of Evaluation: 06/06/21 Chief Complaint:   Diagnosis:  Final diagnoses:  Schizoaffective disorder, depressive type ([place])    History of Present illness: Tyler White is a 25 y.o. male. Patient present to [facility] urgent care to establish care.  Chart reviewed patient received Invega injection 28 days ago however patient had a missed appointment and came in as a walk-in seeking to continue medications as indicated.  He denied suicidal or homicidal ideations.  Reported chronic auditory hallucinations.  States his symptoms has been controlled with medications.  Patient reports a history of schizophrenia and depression.    This NP will provide monthly injection with anticipated follow-up with Dr. Ronne Binning on July 04, 2021.  CSW to provide additional outpatient resources for housing.  Support, encouragement and reassurance was provided.   Psychiatric Specialty Exam  Presentation  General Appearance:Disheveled  Eye Contact:Minimal  Speech:Normal Rate  Speech Volume:Normal  Handedness:Right   Mood and Affect  Mood:Depressed  Affect:Depressed   Thought Process  Thought Processes:Coherent  Descriptions of Associations:Intact  Orientation:Full (Time, Place and Person)  Thought Content:Logical  Diagnosis of Schizophrenia or Schizoaffective disorder in past: No  Duration of Psychotic Symptoms: Less than six months  Hallucinations:Auditory pt reported that he hears voice however he states "i don't know what the voices are saying" "shadows"  Ideas of Reference:None  Suicidal Thoughts:No Without Plan  Homicidal Thoughts:No   Sensorium  Memory:Recent Fair; Immediate Fair; Remote Champion  Insight:Fair   Executive Functions  Concentration:Fair  Attention Span:Fair  [place] of Knowledge:Good  Language:Good   Psychomotor  Activity  Psychomotor Activity:Normal   Assets  Assets:Desire for Improvement; Social Support   Sleep  Sleep:Fair  Number of hours: 6.25   Nutritional Assessment (For OBS and [facility] admissions only) Has the patient had a weight loss or gain of 10 pounds or more in the last 3 months?: No Has the patient had a decrease in food intake/or appetite?: No Does the patient have dental problems?: No Does the patient have eating habits or behaviors that may be indicators of an eating disorder including binging or inducing vomiting?: No Has the patient been eating poorly because of a decreased appetite?: No    Physical Exam: Physical Exam Vitals and nursing note reviewed.  HENT:     Head: Normocephalic.  Cardiovascular:     Rate and Rhythm: Normal rate.     Pulses: Normal pulses.  Pulmonary:     Effort: Pulmonary effort is normal.  Neurological:     Mental Status: He is alert.  Psychiatric:        Attention and Perception: Attention normal.        Mood and Affect: Mood normal.        Speech: Speech normal.        Behavior: Behavior normal.        Thought Content: Thought content normal.        Cognition and Memory: Cognition normal.        Judgment: Judgment normal.    Review of Systems  Psychiatric/Behavioral: Positive for substance abuse. The patient is nervous/anxious.   All other systems reviewed and are negative.  Blood pressure 125/75, pulse 68, temperature 98.4 F (36.9 C), temperature source Oral, resp. rate 16, SpO2 99 %. There is no height or weight on file to calculate BMI.  Musculoskeletal: Strength & Muscle Tone: within normal limits Gait & Station: normal Patient  leans: N/A   [facility] MSE Discharge Disposition for Follow up and Recommendations: Based on my evaluation the patient does not appear to have an emergency medical condition and can be discharged with resources and follow up care in outpatient services for Medication Management   Patient received  Invega Sustantan 156 mg IM ( right deltoid)  With a follow-up appointment to establish care with Dr Bradley Ferris 7/6   Derrill Center, NP 06/06/2021, 1:21 PM

## 2021-06-08 ENCOUNTER — Encounter (HOSPITAL_COMMUNITY): Payer: Self-pay | Admitting: *Deleted

## 2021-06-08 ENCOUNTER — Emergency Department (HOSPITAL_COMMUNITY)
Admission: EM | Admit: 2021-06-08 | Discharge: 2021-06-09 | Disposition: A | Discharge status: Other Acute Inpatient Hospital | Payer: Medicaid Other | Attending: Emergency Medicine | Admitting: Emergency Medicine

## 2021-06-08 ENCOUNTER — Other Ambulatory Visit: Payer: Self-pay

## 2021-06-08 DIAGNOSIS — R45850 Homicidal ideations: Secondary | ICD-10-CM | POA: Insufficient documentation

## 2021-06-08 DIAGNOSIS — F250 Schizoaffective disorder, bipolar type: Secondary | ICD-10-CM | POA: Insufficient documentation

## 2021-06-08 DIAGNOSIS — F17210 Nicotine dependence, cigarettes, uncomplicated: Secondary | ICD-10-CM | POA: Insufficient documentation

## 2021-06-08 DIAGNOSIS — Z20822 Contact with and (suspected) exposure to covid-19: Secondary | ICD-10-CM | POA: Insufficient documentation

## 2021-06-08 DIAGNOSIS — F29 Unspecified psychosis not due to a substance or known physiological condition: Secondary | ICD-10-CM | POA: Insufficient documentation

## 2021-06-08 DIAGNOSIS — R45851 Suicidal ideations: Secondary | ICD-10-CM | POA: Diagnosis not present

## 2021-06-08 DIAGNOSIS — F1410 Cocaine abuse, uncomplicated: Secondary | ICD-10-CM | POA: Insufficient documentation

## 2021-06-08 LAB — CBC WITH DIFFERENTIAL/PLATELET
Abs Immature Granulocytes: 0.02 10*3/uL (ref 0.00–0.07)
Basophils Absolute: 0.1 10*3/uL (ref 0.0–0.1)
Basophils Relative: 1 %
Eosinophils Absolute: 0.3 10*3/uL (ref 0.0–0.5)
Eosinophils Relative: 3 %
HCT: 38.6 % — ABNORMAL LOW (ref 39.0–52.0)
Hemoglobin: 13 g/dL (ref 13.0–17.0)
Immature Granulocytes: 0 %
Lymphocytes Relative: 26 %
Lymphs Abs: 2.2 10*3/uL (ref 0.7–4.0)
MCH: 28.4 pg (ref 26.0–34.0)
MCHC: 33.7 g/dL (ref 30.0–36.0)
MCV: 84.3 fL (ref 80.0–100.0)
Monocytes Absolute: 0.7 10*3/uL (ref 0.1–1.0)
Monocytes Relative: 8 %
Neutro Abs: 5.2 10*3/uL (ref 1.7–7.7)
Neutrophils Relative %: 62 %
Platelets: 294 10*3/uL (ref 150–400)
RBC: 4.58 MIL/uL (ref 4.22–5.81)
RDW: 13.2 % (ref 11.5–15.5)
WBC: 8.5 10*3/uL (ref 4.0–10.5)
nRBC: 0 % (ref 0.0–0.2)

## 2021-06-08 NOTE — ED Provider Notes (Signed)
MSE was initiated and I personally evaluated the patient and placed orders (if any) at  10:45 PM on June 08, 2021.  Patient with a history of schizophrenia presents with SI/HI. Denies AVH. No self harm prior to arrival.   Today's Vitals   06/08/21 2245  BP: 126/72  Pulse: (!) 57  Resp: 18  Temp: 98.4 F (36.9 C)  SpO2: 100%   There is no height or weight on file to calculate BMI.  Patient speaking in whispers, barely audible.  RRR Lungs clear  The patient appears stable so that the remainder of the MSE may be completed by another provider.   Charlann Lange, PA-C 06/08/21 2247    Blanchie Dessert, MD 06/10/21 1559

## 2021-06-08 NOTE — ED Triage Notes (Signed)
The pt reports that he is suicidal  he was just here on the  7th of this month for the same. He was also in psy mat [facility]

## 2021-06-09 ENCOUNTER — Ambulatory Visit (HOSPITAL_COMMUNITY)
Admission: EM | Admit: 2021-06-09 | Discharge: 2021-06-10 | Disposition: A | Discharge status: Home / Self Care | Payer: Medicaid Other | Source: Home / Self Care

## 2021-06-09 ENCOUNTER — Other Ambulatory Visit: Payer: Self-pay

## 2021-06-09 DIAGNOSIS — F1410 Cocaine abuse, uncomplicated: Secondary | ICD-10-CM

## 2021-06-09 DIAGNOSIS — F250 Schizoaffective disorder, bipolar type: Secondary | ICD-10-CM

## 2021-06-09 DIAGNOSIS — R45851 Suicidal ideations: Secondary | ICD-10-CM | POA: Insufficient documentation

## 2021-06-09 DIAGNOSIS — F17210 Nicotine dependence, cigarettes, uncomplicated: Secondary | ICD-10-CM | POA: Insufficient documentation

## 2021-06-09 LAB — ACETAMINOPHEN LEVEL: Acetaminophen (Tylenol), Serum: <10 ug/mL — ABNORMAL LOW (ref 10–30)

## 2021-06-09 LAB — RESP PANEL BY RT-PCR (FLU A&B, COVID) ARPGX2
Influenza A by PCR: NEGATIVE
Influenza B by PCR: NEGATIVE
SARS Coronavirus 2 by RT PCR: NEGATIVE

## 2021-06-09 LAB — COMPREHENSIVE METABOLIC PANEL
ALT: 19 U/L (ref 0–44)
AST: 28 U/L (ref 15–41)
Albumin: 3.9 g/dL (ref 3.5–5.0)
Alkaline Phosphatase: 45 U/L (ref 38–126)
Anion gap: 9 (ref 5–15)
BUN: 19 mg/dL (ref 6–20)
CO2: 24 mmol/L (ref 22–32)
Calcium: 9.3 mg/dL (ref 8.9–10.3)
Chloride: 104 mmol/L (ref 98–111)
Creatinine, Ser: 1.02 mg/dL (ref 0.61–1.24)
GFR, Estimated: >60 mL/min (ref >60)
Glucose, Bld: 91 mg/dL (ref 70–99)
Potassium: 3.5 mmol/L (ref 3.5–5.1)
Sodium: 137 mmol/L (ref 135–145)
Total Bilirubin: 0.7 mg/dL (ref 0.3–1.2)
Total Protein: 6.6 g/dL (ref 6.5–8.1)

## 2021-06-09 LAB — RAPID URINE DRUG SCREEN, HOSP PERFORMED
Amphetamines: NOT DETECTED
Barbiturates: NOT DETECTED
Benzodiazepines: NOT DETECTED
Cocaine: POSITIVE — AB
Opiates: NOT DETECTED
Tetrahydrocannabinol: POSITIVE — AB

## 2021-06-09 LAB — ETHANOL: Alcohol, Ethyl (B): <10 mg/dL (ref <10)

## 2021-06-09 LAB — SALICYLATE LEVEL: Salicylate Lvl: <7 mg/dL — ABNORMAL LOW (ref 7.0–30.0)

## 2021-06-09 MED ORDER — OLANZAPINE 5 MG PO TABS
2.5000 mg | ORAL_TABLET | Freq: Every day | ORAL | Status: DC
  Administered 2021-06-09: 2.5 mg via ORAL
  Filled 2021-06-09: qty 1

## 2021-06-09 MED ORDER — GABAPENTIN 100 MG PO CAPS
200.0000 mg | ORAL_CAPSULE | Freq: Two times a day (BID) | ORAL | Status: DC
  Administered 2021-06-09: 200 mg via ORAL
  Filled 2021-06-09: qty 2

## 2021-06-09 MED ORDER — MAGNESIUM HYDROXIDE 400 MG/5ML PO SUSP: 30.0000 mL | Freq: Every day | ORAL | Status: DC | PRN

## 2021-06-09 MED ORDER — ACETAMINOPHEN 325 MG PO TABS: 650.0000 mg | ORAL_TABLET | Freq: Four times a day (QID) | ORAL | Status: DC | PRN

## 2021-06-09 MED ORDER — TRAZODONE HCL 50 MG PO TABS: 50.0000 mg | ORAL_TABLET | Freq: Every evening | ORAL | Status: DC | PRN

## 2021-06-09 MED ORDER — HYDROXYZINE HCL 25 MG PO TABS: 25.0000 mg | ORAL_TABLET | Freq: Three times a day (TID) | ORAL | Status: DC | PRN

## 2021-06-09 MED ORDER — ALUM & MAG HYDROXIDE-SIMETH 200-200-20 MG/5ML PO SUSP: 30.0000 mL | ORAL | Status: DC | PRN

## 2021-06-09 NOTE — ED Provider Notes (Signed)
Behavioral Health Admission [facility]&P [facility])  Date: 06/09/21 Patient Name: Tyler White MRN: [id number] Chief Complaint: No chief complaint on file.     Diagnoses:  Final diagnoses:  Schizoaffective disorder, bipolar type ([place])  Cocaine abuse ([facility])    HPI: Patient is a 25 year old male who presented to [facility] emergency department with suicidal and homicidal ideations.  Patient has a diagnosed history of schizoaffective disorder bipolar type and cocaine use disorder.  Patient presented today reporting that he was feeling suicidal because he has nowhere to go.  He reports that he has not been in contact with his family because he does not have a phone.  He reports that his mother lives in [place] and his brother lives in [place].  He states the last time they spoke to them was when he was admitted to the hospital in May.  Patient reports that he is still having some passive suicidal ideations with no plan or intent and states that it is just because he is really tired and needs some sleep.  Patient is interested in going to a rescue mission.  He states he has stayed at rescue mission in the past and is interested in returning.  He is informed of the [place] rescue mission and [employer] and is interested in either.  Patient received his Kirt Boys 234 mg IM injection on 06/06/21 at the [facility].  Patient is in agreement with staying overnight and being observed and getting some rest with plan to discharge to rescue mission tomorrow.  Patient currently denies any homicidal ideations and denies any hallucinations.  PHQ 2-9:  [place] ED from 03/11/2021 in [place]  Thoughts that you would be better off dead, or of hurting yourself in some way Several days  PHQ-9 Total Score 17       Flowsheet Row ED from 06/08/2021 in [place] ED from 06/05/2021 in [place]  ED from 05/15/2021 in [place] Error: Question 6 not populated No Risk High Risk        Total Time spent with patient: 30 minutes  Musculoskeletal  Strength & Muscle Tone: within normal limits Gait & Station: normal Patient leans: N/A  Psychiatric Specialty Exam  Presentation General Appearance: Appropriate for Environment; Disheveled  Eye Contact:Fair  Speech:Clear and Coherent; Normal Rate  Speech Volume:Decreased  Handedness:Right   Mood and Affect  Mood:Depressed  Affect:Appropriate; Congruent   Thought Process  Thought Processes:Coherent  Descriptions of Associations:Intact  Orientation:Full (Time, Place and Person)  Thought Content:WDL  Diagnosis of Schizophrenia or Schizoaffective disorder in past: Yes  Duration of Psychotic Symptoms: Greater than six months  Hallucinations:Hallucinations: None  Ideas of Reference:None  Suicidal Thoughts:Suicidal Thoughts: Yes, Passive SI Passive Intent and/or Plan: Without Intent; Without Plan  Homicidal Thoughts:Homicidal Thoughts: No   Sensorium  Memory:Immediate Good; Recent Good; Remote Good  Judgment:Fair  Insight:Good   Executive Functions  Concentration:Good  Attention Span:Good  [place] of Knowledge:Good  Language:Good   Psychomotor Activity  Psychomotor Activity:Psychomotor Activity: Normal   Assets  Assets:Communication Skills; Desire for Improvement; Financial Resources/Insurance; Physical Health   Sleep  Sleep:Sleep: Good   Nutritional Assessment (For OBS and FBC admissions only) Has the patient had a weight loss or gain of 10 pounds or more in the last 3 months?: No Has the patient had a decrease in food intake/or appetite?: No Does the  patient have dental problems?: No Does the patient have eating habits or behaviors that may be indicators of an eating disorder including binging or inducing vomiting?:  No Has the patient recently lost weight without trying?: No Has the patient been eating poorly because of a decreased appetite?: No Malnutrition Screening Tool Score: 0    Physical Exam Vitals and nursing note reviewed.  Constitutional:      Appearance: He is well-developed.  HENT:     Head: Normocephalic.  Eyes:     Pupils: Pupils are equal, round, and reactive to light.  Cardiovascular:     Rate and Rhythm: Normal rate.  Pulmonary:     Effort: Pulmonary effort is normal.  Musculoskeletal:        General: Normal range of motion.  Neurological:     Mental Status: He is alert and oriented to person, place, and time.   Review of Systems  Constitutional: Negative.   HENT: Negative.    Eyes: Negative.   Respiratory: Negative.    Cardiovascular: Negative.   Gastrointestinal: Negative.   Genitourinary: Negative.   Musculoskeletal: Negative.   Skin: Negative.   Neurological: Negative.   Endo/Heme/Allergies: Negative.   Psychiatric/Behavioral:  Positive for depression and suicidal ideas.    Blood pressure 113/69, pulse 61, temperature 98.7 F (37.1 C), temperature source Oral, resp. rate 16, SpO2 100 %. There is no height or weight on file to calculate BMI.  Past Psychiatric History: Schizoaffective disorder, previous hospitalization [facility] 04/23/21 - 04/30/21, cocaine abuse   Is the patient at risk to self? Yes  Has the patient been a risk to self in the past 6 months? Yes .    Has the patient been a risk to self within the distant past? Yes   Is the patient a risk to others? No   Has the patient been a risk to others in the past 6 months? No   Has the patient been a risk to others within the distant past? No   Past Medical History:  Past Medical History:  Diagnosis Date  . Hernia, inguinal, right   . Inguinal hernia    right  . Psychiatric illness   . Schizophrenia ([place])    No past surgical history on file.  Family History:  Family History  Problem Relation Age of  Onset  . Psychiatric Illness Mother   . Hypertension Sister     Social History:  Social History   Socioeconomic History  . Marital status: Single    Spouse name: Not on file  . Number of children: Not on file  . Years of education: 66  . Highest education level: Not on file  Occupational History  . Not on file  Tobacco Use  . Smoking status: Every Day    Packs/day: 0.50    Years: 5.00    Pack years: 2.50    Types: Cigarettes  . Smokeless tobacco: Never  Vaping Use  . Vaping Use: Never used  Substance and Sexual Activity  . Alcohol use: Yes  . Drug use: Yes    Types: Marijuana, Cocaine  . Sexual activity: Yes    Birth control/protection: None  Other Topics Concern  . Not on file  Social History Narrative   ** Merged History Encounter **       ** Merged History Encounter **       Social Determinants of Health   Financial Resource Strain: Not on file  Food Insecurity: Not on file  Transportation Needs: Not  on file  Physical Activity: Not on file  Stress: Not on file  Social Connections: Not on file  Intimate Partner Violence: Not on file    SDOH:  SDOH Screenings   Alcohol Screen: Low Risk   . Last Alcohol Screening Score (AUDIT): 0  Depression (PHQ2-9): Medium Risk  . PHQ-2 Score: 17  Financial Resource Strain: Not on file  Food Insecurity: Not on file  Housing: Not on file  Physical Activity: Not on file  Social Connections: Not on file  Stress: Not on file  Tobacco Use: High Risk  . Smoking Tobacco Use: Every Day  . Smokeless Tobacco Use: Never  Transportation Needs: Not on file    Last Labs:  Admission on 06/08/2021, Discharged on 06/09/2021  Component Date Value Ref Range Status  . WBC 06/08/2021 8.5  4.0 - 10.5 K/uL Final  . RBC 06/08/2021 4.58  4.22 - 5.81 MIL/uL Final  . Hemoglobin 06/08/2021 13.0  13.0 - 17.0 g/dL Final  . HCT 06/08/2021 38.6 (A) 39.0 - 52.0 % Final  . MCV 06/08/2021 84.3  80.0 - 100.0 fL Final  . MCH 06/08/2021 28.4   26.0 - 34.0 pg Final  . MCHC 06/08/2021 33.7  30.0 - 36.0 g/dL Final  . RDW 06/08/2021 13.2  11.5 - 15.5 % Final  . Platelets 06/08/2021 294  150 - 400 K/uL Final  . nRBC 06/08/2021 0.0  0.0 - 0.2 % Final  . Neutrophils Relative % 06/08/2021 62  % Final  . Neutro Abs 06/08/2021 5.2  1.7 - 7.7 K/uL Final  . Lymphocytes Relative 06/08/2021 26  % Final  . Lymphs Abs 06/08/2021 2.2  0.7 - 4.0 K/uL Final  . Monocytes Relative 06/08/2021 8  % Final  . Monocytes Absolute 06/08/2021 0.7  0.1 - 1.0 K/uL Final  . Eosinophils Relative 06/08/2021 3  % Final  . Eosinophils Absolute 06/08/2021 0.3  0.0 - 0.5 K/uL Final  . Basophils Relative 06/08/2021 1  % Final  . Basophils Absolute 06/08/2021 0.1  0.0 - 0.1 K/uL Final  . Immature Granulocytes 06/08/2021 0  % Final  . Abs Immature Granulocytes 06/08/2021 0.02  0.00 - 0.07 K/uL Final   Performed at [facility], [place] [address], [place], [place] [address]  . Salicylate Lvl 60/45/4098 <7.0 (A) 7.0 - 30.0 mg/dL Final   Performed at [facility] [address], [place],  [address]  . Alcohol, Ethyl (B) 06/08/2021 <10  <10 mg/dL Final   Comment: (NOTE) Lowest detectable limit for serum alcohol is 10 mg/dL.  For medical purposes only. Performed at [facility], [place] [address] [place],  [address]   . Sodium 06/08/2021 137  135 - 145 mmol/L Final  . Potassium 06/08/2021 3.5  3.5 - 5.1 mmol/L Final  . Chloride 06/08/2021 104  98 - 111 mmol/L Final  . CO2 06/08/2021 24  22 - 32 mmol/L Final  . Glucose, Bld 06/08/2021 91  70 - 99 mg/dL Final   Glucose reference range applies only to samples taken after fasting for at least 8 hours.  . BUN 06/08/2021 19  6 - 20 mg/dL Final  . Creatinine, Ser 06/08/2021 1.02  0.61 - 1.24 mg/dL Final  . Calcium 06/08/2021 9.3  8.9 - 10.3 mg/dL Final  . Total Protein 06/08/2021 6.6  6.5 - 8.1 g/dL Final  . Albumin 06/08/2021 3.9  3.5 - 5.0 g/dL Final  . AST 06/08/2021 28  15 - 41 U/L  Final   . ALT 06/08/2021 19  0 - 44 U/L Final  . Alkaline Phosphatase 06/08/2021 45  38 - 126 U/L Final  . Total Bilirubin 06/08/2021 0.7  0.3 - 1.2 mg/dL Final  . GFR, Estimated 06/08/2021 >60  >60 mL/min Final   Comment: (NOTE) Calculated using the CKD-EPI Creatinine Equation (2021)   . Anion gap 06/08/2021 9  5 - 15 Final   Performed at [facility] [address], [place], [place] [address]  . Acetaminophen (Tylenol), Serum 06/08/2021 <10 (A) 10 - 30 ug/mL Final   Comment: (NOTE) Therapeutic concentrations vary significantly. A range of 10-30 ug/mL  may be an effective concentration for many patients. However, some  are best treated at concentrations outside of this range. Acetaminophen concentrations >150 ug/mL at 4 hours after ingestion  and >50 ug/mL at 12 hours after ingestion are often associated with  toxic reactions.  Performed at [facility], [place] [address], [place], [place] [address]   . Opiates 06/08/2021 NONE DETECTED  NONE DETECTED Final  . Cocaine 06/08/2021 POSITIVE (A) NONE DETECTED Final  . Benzodiazepines 06/08/2021 NONE DETECTED  NONE DETECTED Final  . Amphetamines 06/08/2021 NONE DETECTED  NONE DETECTED Final  . Tetrahydrocannabinol 06/08/2021 POSITIVE (A) NONE DETECTED Final  . Barbiturates 06/08/2021 NONE DETECTED  NONE DETECTED Final   Comment: (NOTE) DRUG SCREEN FOR MEDICAL PURPOSES ONLY.  IF CONFIRMATION IS NEEDED FOR ANY PURPOSE, NOTIFY LAB WITHIN 5 DAYS.  LOWEST DETECTABLE LIMITS FOR URINE DRUG SCREEN Drug Class                     Cutoff (ng/mL) Amphetamine and metabolites    1000 Barbiturate and metabolites    200 Benzodiazepine                 542 Tricyclics and metabolites     300 Opiates and metabolites        300 Cocaine and metabolites        300 THC                            50 Performed at [facility], [place] [address], [place], [place] [address]   . SARS Coronavirus 2 by RT PCR 06/09/2021 NEGATIVE  NEGATIVE Final    Comment: (NOTE) SARS-CoV-2 target nucleic acids are NOT DETECTED.  The SARS-CoV-2 RNA is generally detectable in upper respiratory specimens during the acute phase of infection. The lowest concentration of SARS-CoV-2 viral copies this assay can detect is 138 copies/mL. A negative result does not preclude SARS-Cov-2 infection and should not be used as the sole basis for treatment or other patient management decisions. A negative result may occur with  improper specimen collection/handling, submission of specimen other than nasopharyngeal swab, presence of viral mutation(s) within the areas targeted by this assay, and inadequate number of viral copies(<138 copies/mL). A negative result must be combined with clinical observations, patient history, and epidemiological information. The expected result is Negative.  Fact Sheet for Patients:  EntrepreneurPulse.com.au  Fact Sheet for Healthcare Providers:  IncredibleEmployment.be  This test is no                          t yet approved or cleared by the [place] FDA and  has been authorized for detection and/or diagnosis of SARS-CoV-2 by FDA under an Emergency Use Authorization (EUA). This EUA  will remain  in effect (meaning this test can be used) for the duration of the COVID-19 declaration under Section 564(b)(1) of the Act, 21 U.S.C.section 360bbb-3(b)(1), unless the authorization is terminated  or revoked sooner.      . Influenza A by PCR 06/09/2021 NEGATIVE  NEGATIVE Final  . Influenza B by PCR 06/09/2021 NEGATIVE  NEGATIVE Final   Comment: (NOTE) The Xpert Xpress SARS-CoV-2/FLU/RSV plus assay is intended as an aid in the diagnosis of influenza from Nasopharyngeal swab specimens and should not be used as a sole basis for treatment. Nasal washings and aspirates are unacceptable for Xpert Xpress SARS-CoV-2/FLU/RSV testing.  Fact Sheet for  Patients: EntrepreneurPulse.com.au  Fact Sheet for Healthcare Providers: IncredibleEmployment.be  This test is not yet approved or cleared by the [place] FDA and has been authorized for detection and/or diagnosis of SARS-CoV-2 by FDA under an Emergency Use Authorization (EUA). This EUA will remain in effect (meaning this test can be used) for the duration of the COVID-19 declaration under Section 564(b)(1) of the Act, 21 U.S.C. section 360bbb-3(b)(1), unless the authorization is terminated or revoked.  Performed at [facility], [place] [address] [place], [place] [address]   Admission on 06/05/2021, Discharged on 06/05/2021  Component Date Value Ref Range Status  . Sodium 06/05/2021 136  135 - 145 mmol/L Final  . Potassium 06/05/2021 3.7  3.5 - 5.1 mmol/L Final  . Chloride 06/05/2021 104  98 - 111 mmol/L Final  . CO2 06/05/2021 26  22 - 32 mmol/L Final  . Glucose, Bld 06/05/2021 101 (A) 70 - 99 mg/dL Final   Glucose reference range applies only to samples taken after fasting for at least 8 hours.  . BUN 06/05/2021 20  6 - 20 mg/dL Final  . Creatinine, Ser 06/05/2021 0.98  0.61 - 1.24 mg/dL Final  . Calcium 06/05/2021 9.4  8.9 - 10.3 mg/dL Final  . Total Protein 06/05/2021 6.6  6.5 - 8.1 g/dL Final  . Albumin 06/05/2021 3.9  3.5 - 5.0 g/dL Final  . AST 06/05/2021 25  15 - 41 U/L Final  . ALT 06/05/2021 15  0 - 44 U/L Final  . Alkaline Phosphatase 06/05/2021 47  38 - 126 U/L Final  . Total Bilirubin 06/05/2021 0.3  0.3 - 1.2 mg/dL Final  . GFR, Estimated 06/05/2021 >60  >60 mL/min Final   Comment: (NOTE) Calculated using the CKD-EPI Creatinine Equation (2021)   . Anion gap 06/05/2021 6  5 - 15 Final   Performed at [facility] [address] [place], [place] [address]  . Alcohol, Ethyl (B) 06/05/2021 <10  <10 mg/dL Final   Comment: (NOTE) Lowest detectable limit for serum alcohol is 10 mg/dL.  For medical purposes  only. Performed at [facility], [place] [address], [place], [place] [address]   . Salicylate Lvl 11/01/1593 <7.0 (A) 7.0 - 30.0 mg/dL Final   Performed at [facility] [address] [place], [place] [address]  . Acetaminophen (Tylenol), Serum 06/05/2021 <10 (A) 10 - 30 ug/mL Final   Comment: (NOTE) Therapeutic concentrations vary significantly. A range of 10-30 ug/mL  may be an effective concentration for many patients. However, some  are best treated at concentrations outside of this range. Acetaminophen concentrations >150 ug/mL at 4 hours after ingestion  and >50 ug/mL at 12 hours after ingestion are often associated with  toxic reactions.  Performed at [facility], [place] [address] [place],  [address]   .  WBC 06/05/2021 6.3  4.0 - 10.5 K/uL Final  . RBC 06/05/2021 4.61  4.22 - 5.81 MIL/uL Final  . Hemoglobin 06/05/2021 13.1  13.0 - 17.0 g/dL Final  . HCT 06/05/2021 39.0  39.0 - 52.0 % Final  . MCV 06/05/2021 84.6  80.0 - 100.0 fL Final  . MCH 06/05/2021 28.4  26.0 - 34.0 pg Final  . MCHC 06/05/2021 33.6  30.0 - 36.0 g/dL Final  . RDW 06/05/2021 13.6  11.5 - 15.5 % Final  . Platelets 06/05/2021 285  150 - 400 K/uL Final  . nRBC 06/05/2021 0.0  0.0 - 0.2 % Final   Performed at [facility], [place] [address] [place], [place] [address]  Admission on 05/15/2021, Discharged on 05/15/2021  Component Date Value Ref Range Status  . Sodium 05/15/2021 136  135 - 145 mmol/L Final  . Potassium 05/15/2021 4.0  3.5 - 5.1 mmol/L Final  . Chloride 05/15/2021 106  98 - 111 mmol/L Final  . CO2 05/15/2021 25  22 - 32 mmol/L Final  . Glucose, Bld 05/15/2021 94  70 - 99 mg/dL Final   Glucose reference range applies only to samples taken after fasting for at least 8 hours.  . BUN 05/15/2021 14  6 - 20 mg/dL Final  . Creatinine, Ser 05/15/2021 0.88  0.61 - 1.24 mg/dL Final  . Calcium 05/15/2021 9.3  8.9 - 10.3 mg/dL Final  . Total Protein 05/15/2021 6.3 (A) 6.5 -  8.1 g/dL Final  . Albumin 05/15/2021 3.8  3.5 - 5.0 g/dL Final  . AST 05/15/2021 30  15 - 41 U/L Final  . ALT 05/15/2021 14  0 - 44 U/L Final  . Alkaline Phosphatase 05/15/2021 51  38 - 126 U/L Final  . Total Bilirubin 05/15/2021 0.4  0.3 - 1.2 mg/dL Final  . GFR, Estimated 05/15/2021 >60  >60 mL/min Final   Comment: (NOTE) Calculated using the CKD-EPI Creatinine Equation (2021)   . Anion gap 05/15/2021 5  5 - 15 Final   Performed at [facility], [place] [address] [place], [place] [address]  . Alcohol, Ethyl (B) 05/15/2021 <10  <10 mg/dL Final   Comment: (NOTE) Lowest detectable limit for serum alcohol is 10 mg/dL.  For medical purposes only. Performed at [facility], [place] [address] [place], [place] [address]   . Salicylate Lvl 80/99/8338 <7.0 (A) 7.0 - 30.0 mg/dL Final   Performed at [facility] [address], [place], [place] [address]  . Acetaminophen (Tylenol), Serum 05/15/2021 <10 (A) 10 - 30 ug/mL Final   Comment: (NOTE) Therapeutic concentrations vary significantly. A range of 10-30 ug/mL  may be an effective concentration for many patients. However, some  are best treated at concentrations outside of this range. Acetaminophen concentrations >150 ug/mL at 4 hours after ingestion  and >50 ug/mL at 12 hours after ingestion are often associated with  toxic reactions.  Performed at [facility], [place] [address] [place], [place] [address]   . WBC 05/15/2021 9.4  4.0 - 10.5 K/uL Final  . RBC 05/15/2021 4.47  4.22 - 5.81 MIL/uL Final  . Hemoglobin 05/15/2021 12.5 (A) 13.0 - 17.0 g/dL Final  . HCT 05/15/2021 37.9 (A) 39.0 - 52.0 % Final  . MCV 05/15/2021 84.8  80.0 - 100.0 fL Final  . MCH 05/15/2021 28.0  26.0 - 34.0 pg Final  . MCHC 05/15/2021 33.0  30.0 - 36.0 g/dL Final  . RDW 05/15/2021  14.2  11.5 - 15.5 % Final  . Platelets 05/15/2021 287  150 - 400 K/uL Final  . nRBC 05/15/2021 0.0  0.0 - 0.2 % Final   Performed at [facility],  [place] [address], [place], [place] [address]  . Opiates 05/15/2021 NONE DETECTED  NONE DETECTED Final  . Cocaine 05/15/2021 POSITIVE (A) NONE DETECTED Final  . Benzodiazepines 05/15/2021 NONE DETECTED  NONE DETECTED Final  . Amphetamines 05/15/2021 NONE DETECTED  NONE DETECTED Final  . Tetrahydrocannabinol 05/15/2021 POSITIVE (A) NONE DETECTED Final  . Barbiturates 05/15/2021 NONE DETECTED  NONE DETECTED Final   Comment: (NOTE) DRUG SCREEN FOR MEDICAL PURPOSES ONLY.  IF CONFIRMATION IS NEEDED FOR ANY PURPOSE, NOTIFY LAB WITHIN 5 DAYS.  LOWEST DETECTABLE LIMITS FOR URINE DRUG SCREEN Drug Class                     Cutoff (ng/mL) Amphetamine and metabolites    1000 Barbiturate and metabolites    200 Benzodiazepine                 578 Tricyclics and metabolites     300 Opiates and metabolites        300 Cocaine and metabolites        300 THC                            50 Performed at [facility], [place] [address] [place], [place] [address]   . SARS Coronavirus 2 by RT PCR 05/15/2021 NEGATIVE  NEGATIVE Final   Comment: (NOTE) SARS-CoV-2 target nucleic acids are NOT DETECTED.  The SARS-CoV-2 RNA is generally detectable in upper respiratory specimens during the acute phase of infection. The lowest concentration of SARS-CoV-2 viral copies this assay can detect is 138 copies/mL. A negative result does not preclude SARS-Cov-2 infection and should not be used as the sole basis for treatment or other patient management decisions. A negative result may occur with  improper specimen collection/handling, submission of specimen other than nasopharyngeal swab, presence of viral mutation(s) within the areas targeted by this assay, and inadequate number of viral copies(<138 copies/mL). A negative result must be combined with clinical observations, patient history, and epidemiological information. The expected result is Negative.  Fact Sheet for Patients:   EntrepreneurPulse.com.au  Fact Sheet for Healthcare Providers:  IncredibleEmployment.be  This test is no                          t yet approved or cleared by the [place] FDA and  has been authorized for detection and/or diagnosis of SARS-CoV-2 by FDA under an Emergency Use Authorization (EUA). This EUA will remain  in effect (meaning this test can be used) for the duration of the COVID-19 declaration under Section 564(b)(1) of the Act, 21 U.S.C.section 360bbb-3(b)(1), unless the authorization is terminated  or revoked sooner.      . Influenza A by PCR 05/15/2021 NEGATIVE  NEGATIVE Final  . Influenza B by PCR 05/15/2021 NEGATIVE  NEGATIVE Final   Comment: (NOTE) The Xpert Xpress SARS-CoV-2/FLU/RSV plus assay is intended as an aid in the diagnosis of influenza from Nasopharyngeal swab specimens and should not be used as a sole basis for treatment. Nasal washings and aspirates are unacceptable for Xpert Xpress SARS-CoV-2/FLU/RSV testing.  Fact Sheet for Patients: EntrepreneurPulse.com.au  Fact Sheet for Healthcare Providers: IncredibleEmployment.be  This test is not yet  approved or cleared by the [place] and has been authorized for detection and/or diagnosis of SARS-CoV-2 by FDA under an Emergency Use Authorization (EUA). This EUA will remain in effect (meaning this test can be used) for the duration of the COVID-19 declaration under Section 564(b)(1) of the Act, 21 U.S.C. section 360bbb-3(b)(1), unless the authorization is terminated or revoked.  Performed at [facility], [place] [address], [place], [place] [address]   Admission on 05/04/2021, Discharged on 05/05/2021  Component Date Value Ref Range Status  . SARS Coronavirus 2 by RT PCR 05/04/2021 NEGATIVE  NEGATIVE Final   Comment: (NOTE) SARS-CoV-2 target nucleic acids are NOT DETECTED.  The SARS-CoV-2 RNA is generally  detectable in upper respiratory specimens during the acute phase of infection. The lowest concentration of SARS-CoV-2 viral copies this assay can detect is 138 copies/mL. A negative result does not preclude SARS-Cov-2 infection and should not be used as the sole basis for treatment or other patient management decisions. A negative result may occur with  improper specimen collection/handling, submission of specimen other than nasopharyngeal swab, presence of viral mutation(s) within the areas targeted by this assay, and inadequate number of viral copies(<138 copies/mL). A negative result must be combined with clinical observations, patient history, and epidemiological information. The expected result is Negative.  Fact Sheet for Patients:  EntrepreneurPulse.com.au  Fact Sheet for Healthcare Providers:  IncredibleEmployment.be  This test is no                          t yet approved or cleared by the [place] FDA and  has been authorized for detection and/or diagnosis of SARS-CoV-2 by FDA under an Emergency Use Authorization (EUA). This EUA will remain  in effect (meaning this test can be used) for the duration of the COVID-19 declaration under Section 564(b)(1) of the Act, 21 U.S.C.section 360bbb-3(b)(1), unless the authorization is terminated  or revoked sooner.      . Influenza A by PCR 05/04/2021 NEGATIVE  NEGATIVE Final  . Influenza B by PCR 05/04/2021 NEGATIVE  NEGATIVE Final   Comment: (NOTE) The Xpert Xpress SARS-CoV-2/FLU/RSV plus assay is intended as an aid in the diagnosis of influenza from Nasopharyngeal swab specimens and should not be used as a sole basis for treatment. Nasal washings and aspirates are unacceptable for Xpert Xpress SARS-CoV-2/FLU/RSV testing.  Fact Sheet for Patients: EntrepreneurPulse.com.au  Fact Sheet for Healthcare Providers: IncredibleEmployment.be  This test is  not yet approved or cleared by the [place] FDA and has been authorized for detection and/or diagnosis of SARS-CoV-2 by FDA under an Emergency Use Authorization (EUA). This EUA will remain in effect (meaning this test can be used) for the duration of the COVID-19 declaration under Section 564(b)(1) of the Act, 21 U.S.C. section 360bbb-3(b)(1), unless the authorization is terminated or revoked.  Performed at [facility], [place] [address], [place], [place] [address]   . Sodium 05/04/2021 136  135 - 145 mmol/L Final  . Potassium 05/04/2021 4.1  3.5 - 5.1 mmol/L Final  . Chloride 05/04/2021 105  98 - 111 mmol/L Final  . CO2 05/04/2021 26  22 - 32 mmol/L Final  . Glucose, Bld 05/04/2021 88  70 - 99 mg/dL Final   Glucose reference range applies only to samples taken after fasting for at least 8 hours.  . BUN 05/04/2021 17  6 - 20 mg/dL Final  . Creatinine, Ser 05/04/2021 0.96  0.61 - 1.24  mg/dL Final  . Calcium 05/04/2021 9.1  8.9 - 10.3 mg/dL Final  . Total Protein 05/04/2021 6.2 (A) 6.5 - 8.1 g/dL Final  . Albumin 05/04/2021 3.6  3.5 - 5.0 g/dL Final  . AST 05/04/2021 30  15 - 41 U/L Final  . ALT 05/04/2021 27  0 - 44 U/L Final  . Alkaline Phosphatase 05/04/2021 46  38 - 126 U/L Final  . Total Bilirubin 05/04/2021 1.0  0.3 - 1.2 mg/dL Final  . GFR, Estimated 05/04/2021 >60  >60 mL/min Final   Comment: (NOTE) Calculated using the CKD-EPI Creatinine Equation (2021)   . Anion gap 05/04/2021 5  5 - 15 Final   Performed at [facility] [address] [place], [place] [address]  . Alcohol, Ethyl (B) 05/04/2021 <10  <10 mg/dL Final   Comment: (NOTE) Lowest detectable limit for serum alcohol is 10 mg/dL.  For medical purposes only. Performed at [facility], [place] [address], [place], [place] [address]   . Opiates 05/04/2021 NONE DETECTED  NONE DETECTED Final  . Cocaine 05/04/2021 POSITIVE (A) NONE DETECTED Final  . Benzodiazepines 05/04/2021 NONE DETECTED  NONE  DETECTED Final  . Amphetamines 05/04/2021 NONE DETECTED  NONE DETECTED Final  . Tetrahydrocannabinol 05/04/2021 POSITIVE (A) NONE DETECTED Final  . Barbiturates 05/04/2021 NONE DETECTED  NONE DETECTED Final   Comment: (NOTE) DRUG SCREEN FOR MEDICAL PURPOSES ONLY.  IF CONFIRMATION IS NEEDED FOR ANY PURPOSE, NOTIFY LAB WITHIN 5 DAYS.  LOWEST DETECTABLE LIMITS FOR URINE DRUG SCREEN Drug Class                     Cutoff (ng/mL) Amphetamine and metabolites    1000 Barbiturate and metabolites    200 Benzodiazepine                 258 Tricyclics and metabolites     300 Opiates and metabolites        300 Cocaine and metabolites        300 THC                            50 Performed at [facility], [place] [address], [place], [place] [address]   . WBC 05/04/2021 8.0  4.0 - 10.5 K/uL Final  . RBC 05/04/2021 4.57  4.22 - 5.81 MIL/uL Final  . Hemoglobin 05/04/2021 12.8 (A) 13.0 - 17.0 g/dL Final  . HCT 05/04/2021 39.1  39.0 - 52.0 % Final  . MCV 05/04/2021 85.6  80.0 - 100.0 fL Final  . MCH 05/04/2021 28.0  26.0 - 34.0 pg Final  . MCHC 05/04/2021 32.7  30.0 - 36.0 g/dL Final  . RDW 05/04/2021 14.2  11.5 - 15.5 % Final  . Platelets 05/04/2021 264  150 - 400 K/uL Final  . nRBC 05/04/2021 0.0  0.0 - 0.2 % Final  . Neutrophils Relative % 05/04/2021 65  % Final  . Neutro Abs 05/04/2021 5.2  1.7 - 7.7 K/uL Final  . Lymphocytes Relative 05/04/2021 18  % Final  . Lymphs Abs 05/04/2021 1.5  0.7 - 4.0 K/uL Final  . Monocytes Relative 05/04/2021 12  % Final  . Monocytes Absolute 05/04/2021 1.0  0.1 - 1.0 K/uL Final  . Eosinophils Relative 05/04/2021 4  % Final  . Eosinophils Absolute 05/04/2021 0.3  0.0 - 0.5 K/uL Final  . Basophils Relative 05/04/2021 1  % Final  . Basophils Absolute  05/04/2021 0.0  0.0 - 0.1 K/uL Final  . Immature Granulocytes 05/04/2021 0  % Final  . Abs Immature Granulocytes 05/04/2021 0.02  0.00 - 0.07 K/uL Final   Performed at [facility] [address]  [place], [place] [address]  Admission on 04/22/2021, Discharged on 04/23/2021  Component Date Value Ref Range Status  . SARS Coronavirus 2 by RT PCR 04/23/2021 NEGATIVE  NEGATIVE Final   Comment: (NOTE) SARS-CoV-2 target nucleic acids are NOT DETECTED.  The SARS-CoV-2 RNA is generally detectable in upper respiratory specimens during the acute phase of infection. The lowest concentration of SARS-CoV-2 viral copies this assay can detect is 138 copies/mL. A negative result does not preclude SARS-Cov-2 infection and should not be used as the sole basis for treatment or other patient management decisions. A negative result may occur with  improper specimen collection/handling, submission of specimen other than nasopharyngeal swab, presence of viral mutation(s) within the areas targeted by this assay, and inadequate number of viral copies(<138 copies/mL). A negative result must be combined with clinical observations, patient history, and epidemiological information. The expected result is Negative.  Fact Sheet for Patients:  EntrepreneurPulse.com.au  Fact Sheet for Healthcare Providers:  IncredibleEmployment.be  This test is no                          t yet approved or cleared by the [place] FDA and  has been authorized for detection and/or diagnosis of SARS-CoV-2 by FDA under an Emergency Use Authorization (EUA). This EUA will remain  in effect (meaning this test can be used) for the duration of the COVID-19 declaration under Section 564(b)(1) of the Act, 21 U.S.C.section 360bbb-3(b)(1), unless the authorization is terminated  or revoked sooner.      . Influenza A by PCR 04/23/2021 NEGATIVE  NEGATIVE Final  . Influenza B by PCR 04/23/2021 NEGATIVE  NEGATIVE Final   Comment: (NOTE) The Xpert Xpress SARS-CoV-2/FLU/RSV plus assay is intended as an aid in the diagnosis of influenza from Nasopharyngeal swab specimens and should not be used as a sole  basis for treatment. Nasal washings and aspirates are unacceptable for Xpert Xpress SARS-CoV-2/FLU/RSV testing.  Fact Sheet for Patients: EntrepreneurPulse.com.au  Fact Sheet for Healthcare Providers: IncredibleEmployment.be  This test is not yet approved or cleared by the [place] FDA and has been authorized for detection and/or diagnosis of SARS-CoV-2 by FDA under an Emergency Use Authorization (EUA). This EUA will remain in effect (meaning this test can be used) for the duration of the COVID-19 declaration under Section 564(b)(1) of the Act, 21 U.S.C. section 360bbb-3(b)(1), unless the authorization is terminated or revoked.  Performed at [facility], [place] [address], [place], [place] [address]   . Sodium 04/22/2021 138  135 - 145 mmol/L Final  . Potassium 04/22/2021 3.8  3.5 - 5.1 mmol/L Final  . Chloride 04/22/2021 105  98 - 111 mmol/L Final  . CO2 04/22/2021 27  22 - 32 mmol/L Final  . Glucose, Bld 04/22/2021 87  70 - 99 mg/dL Final   Glucose reference range applies only to samples taken after fasting for at least 8 hours.  . BUN 04/22/2021 13  6 - 20 mg/dL Final  . Creatinine, Ser 04/22/2021 1.07  0.61 - 1.24 mg/dL Final  . Calcium 04/22/2021 9.4  8.9 - 10.3 mg/dL Final  . Total Protein 04/22/2021 6.8  6.5 - 8.1 g/dL Final  . Albumin 04/22/2021 4.3  3.5 -  5.0 g/dL Final  . AST 04/22/2021 27  15 - 41 U/L Final  . ALT 04/22/2021 17  0 - 44 U/L Final  . Alkaline Phosphatase 04/22/2021 54  38 - 126 U/L Final  . Total Bilirubin 04/22/2021 0.6  0.3 - 1.2 mg/dL Final  . GFR, Estimated 04/22/2021 >60  >60 mL/min Final   Comment: (NOTE) Calculated using the CKD-EPI Creatinine Equation (2021)   . Anion gap 04/22/2021 6  5 - 15 Final   Performed at [facility] [address], [place], [place] [address]  . Alcohol, Ethyl (B) 04/22/2021 <10  <10 mg/dL Final   Comment: (NOTE) Lowest detectable limit for serum alcohol is 10  mg/dL.  For medical purposes only. Performed at [facility], [place] [address], [place], [place] [address]   . Opiates 04/23/2021 NONE DETECTED  NONE DETECTED Final  . Cocaine 04/23/2021 POSITIVE (A) NONE DETECTED Final  . Benzodiazepines 04/23/2021 NONE DETECTED  NONE DETECTED Final  . Amphetamines 04/23/2021 NONE DETECTED  NONE DETECTED Final  . Tetrahydrocannabinol 04/23/2021 POSITIVE (A) NONE DETECTED Final  . Barbiturates 04/23/2021 NONE DETECTED  NONE DETECTED Final   Comment: (NOTE) DRUG SCREEN FOR MEDICAL PURPOSES ONLY.  IF CONFIRMATION IS NEEDED FOR ANY PURPOSE, NOTIFY LAB WITHIN 5 DAYS.  LOWEST DETECTABLE LIMITS FOR URINE DRUG SCREEN Drug Class                     Cutoff (ng/mL) Amphetamine and metabolites    1000 Barbiturate and metabolites    200 Benzodiazepine                 629 Tricyclics and metabolites     300 Opiates and metabolites        300 Cocaine and metabolites        300 THC                            50 Performed at [facility], [place] [address] [place], [place] [address]   . WBC 04/22/2021 8.5  4.0 - 10.5 K/uL Final  . RBC 04/22/2021 5.06  4.22 - 5.81 MIL/uL Final  . Hemoglobin 04/22/2021 14.3  13.0 - 17.0 g/dL Final  . HCT 04/22/2021 43.2  39.0 - 52.0 % Final  . MCV 04/22/2021 85.4  80.0 - 100.0 fL Final  . MCH 04/22/2021 28.3  26.0 - 34.0 pg Final  . MCHC 04/22/2021 33.1  30.0 - 36.0 g/dL Final  . RDW 04/22/2021 13.7  11.5 - 15.5 % Final  . Platelets 04/22/2021 331  150 - 400 K/uL Final  . nRBC 04/22/2021 0.0  0.0 - 0.2 % Final  . Neutrophils Relative % 04/22/2021 47  % Final  . Neutro Abs 04/22/2021 4.0  1.7 - 7.7 K/uL Final  . Lymphocytes Relative 04/22/2021 40  % Final  . Lymphs Abs 04/22/2021 3.4  0.7 - 4.0 K/uL Final  . Monocytes Relative 04/22/2021 8  % Final  . Monocytes Absolute 04/22/2021 0.7  0.1 - 1.0 K/uL Final  . Eosinophils Relative 04/22/2021 4  % Final  . Eosinophils Absolute 04/22/2021 0.4  0.0 - 0.5 K/uL Final   . Basophils Relative 04/22/2021 1  % Final  . Basophils Absolute 04/22/2021 0.1  0.0 - 0.1 K/uL Final  . Immature Granulocytes 04/22/2021 0  % Final  . Abs Immature Granulocytes 04/22/2021 0.02  0.00 - 0.07 K/uL Final  Performed at [facility], [place] [address], [place], [place] [address]  . Salicylate Lvl 56/38/9373 <7.0 (A) 7.0 - 30.0 mg/dL Final   Performed at [facility] [address] [place], [place] [address]  . Acetaminophen (Tylenol), Serum 04/22/2021 <10 (A) 10 - 30 ug/mL Final   Comment: (NOTE) Therapeutic concentrations vary significantly. A range of 10-30 ug/mL  may be an effective concentration for many patients. However, some  are best treated at concentrations outside of this range. Acetaminophen concentrations >150 ug/mL at 4 hours after ingestion  and >50 ug/mL at 12 hours after ingestion are often associated with  toxic reactions.  Performed at [facility], [place] [address] [place], [place] [address]   Admission on 04/20/2021, Discharged on 04/21/2021  Component Date Value Ref Range Status  . SARS Coronavirus 2 by RT PCR 04/20/2021 NEGATIVE  NEGATIVE Final   Comment: (NOTE) SARS-CoV-2 target nucleic acids are NOT DETECTED.  The SARS-CoV-2 RNA is generally detectable in upper respiratory specimens during the acute phase of infection. The lowest concentration of SARS-CoV-2 viral copies this assay can detect is 138 copies/mL. A negative result does not preclude SARS-Cov-2 infection and should not be used as the sole basis for treatment or other patient management decisions. A negative result may occur with  improper specimen collection/handling, submission of specimen other than nasopharyngeal swab, presence of viral mutation(s) within the areas targeted by this assay, and inadequate number of viral copies(<138 copies/mL). A negative result must be combined with clinical observations, patient history, and epidemiological information. The  expected result is Negative.  Fact Sheet for Patients:  EntrepreneurPulse.com.au  Fact Sheet for Healthcare Providers:  IncredibleEmployment.be  This test is no                          t yet approved or cleared by the [place] FDA and  has been authorized for detection and/or diagnosis of SARS-CoV-2 by FDA under an Emergency Use Authorization (EUA). This EUA will remain  in effect (meaning this test can be used) for the duration of the COVID-19 declaration under Section 564(b)(1) of the Act, 21 U.S.C.section 360bbb-3(b)(1), unless the authorization is terminated  or revoked sooner.      . Influenza A by PCR 04/20/2021 NEGATIVE  NEGATIVE Final  . Influenza B by PCR 04/20/2021 NEGATIVE  NEGATIVE Final   Comment: (NOTE) The Xpert Xpress SARS-CoV-2/FLU/RSV plus assay is intended as an aid in the diagnosis of influenza from Nasopharyngeal swab specimens and should not be used as a sole basis for treatment. Nasal washings and aspirates are unacceptable for Xpert Xpress SARS-CoV-2/FLU/RSV testing.  Fact Sheet for Patients: EntrepreneurPulse.com.au  Fact Sheet for Healthcare Providers: IncredibleEmployment.be  This test is not yet approved or cleared by the [place] FDA and has been authorized for detection and/or diagnosis of SARS-CoV-2 by FDA under an Emergency Use Authorization (EUA). This EUA will remain in effect (meaning this test can be used) for the duration of the COVID-19 declaration under Section 564(b)(1) of the Act, 21 U.S.C. section 360bbb-3(b)(1), unless the authorization is terminated or revoked.  Performed at [facility], [place] [address], [place], [place] [address]   . Sodium 04/20/2021 139  135 - 145 mmol/L Final  . Potassium 04/20/2021 3.4 (A) 3.5 - 5.1 mmol/L Final  . Chloride 04/20/2021 106  98 - 111 mmol/L Final  . CO2 04/20/2021 26  22 - 32 mmol/L Final  .  Glucose,  Bld 04/20/2021 90  70 - 99 mg/dL Final   Glucose reference range applies only to samples taken after fasting for at least 8 hours.  . BUN 04/20/2021 13  6 - 20 mg/dL Final  . Creatinine, Ser 04/20/2021 0.93  0.61 - 1.24 mg/dL Final  . Calcium 04/20/2021 9.4  8.9 - 10.3 mg/dL Final  . Total Protein 04/20/2021 6.7  6.5 - 8.1 g/dL Final  . Albumin 04/20/2021 4.2  3.5 - 5.0 g/dL Final  . AST 04/20/2021 22  15 - 41 U/L Final  . ALT 04/20/2021 16  0 - 44 U/L Final  . Alkaline Phosphatase 04/20/2021 50  38 - 126 U/L Final  . Total Bilirubin 04/20/2021 1.0  0.3 - 1.2 mg/dL Final  . GFR, Estimated 04/20/2021 >60  >60 mL/min Final   Comment: (NOTE) Calculated using the CKD-EPI Creatinine Equation (2021)   . Anion gap 04/20/2021 7  5 - 15 Final   Performed at [facility] [address] [place], [place] [address]  . Alcohol, Ethyl (B) 04/20/2021 <10  <10 mg/dL Final   Comment: (NOTE) Lowest detectable limit for serum alcohol is 10 mg/dL.  For medical purposes only. Performed at [facility], [place] [address] [place], [place] [address]   . Opiates 04/20/2021 NONE DETECTED  NONE DETECTED Final  . Cocaine 04/20/2021 POSITIVE (A) NONE DETECTED Final  . Benzodiazepines 04/20/2021 NONE DETECTED  NONE DETECTED Final  . Amphetamines 04/20/2021 NONE DETECTED  NONE DETECTED Final  . Tetrahydrocannabinol 04/20/2021 POSITIVE (A) NONE DETECTED Final  . Barbiturates 04/20/2021 NONE DETECTED  NONE DETECTED Final   Comment: (NOTE) DRUG SCREEN FOR MEDICAL PURPOSES ONLY.  IF CONFIRMATION IS NEEDED FOR ANY PURPOSE, NOTIFY LAB WITHIN 5 DAYS.  LOWEST DETECTABLE LIMITS FOR URINE DRUG SCREEN Drug Class                     Cutoff (ng/mL) Amphetamine and metabolites    1000 Barbiturate and metabolites    200 Benzodiazepine                 179 Tricyclics and metabolites     300 Opiates and metabolites        300 Cocaine and metabolites        300 THC                            50 Performed at  [facility], [place] [address] [place], [place] [address]   . WBC 04/20/2021 7.0  4.0 - 10.5 K/uL Final  . RBC 04/20/2021 4.86  4.22 - 5.81 MIL/uL Final  . Hemoglobin 04/20/2021 13.7  13.0 - 17.0 g/dL Final  . HCT 04/20/2021 40.4  39.0 - 52.0 % Final  . MCV 04/20/2021 83.1  80.0 - 100.0 fL Final  . MCH 04/20/2021 28.2  26.0 - 34.0 pg Final  . MCHC 04/20/2021 33.9  30.0 - 36.0 g/dL Final  . RDW 04/20/2021 13.7  11.5 - 15.5 % Final  . Platelets 04/20/2021 327  150 - 400 K/uL Final  . nRBC 04/20/2021 0.0  0.0 - 0.2 % Final  . Neutrophils Relative % 04/20/2021 67  % Final  . Neutro Abs 04/20/2021 4.7  1.7 - 7.7 K/uL Final  . Lymphocytes Relative 04/20/2021 25  % Final  . Lymphs Abs 04/20/2021 1.7  0.7 - 4.0 K/uL Final  . Monocytes Relative 04/20/2021 6  %  Final  . Monocytes Absolute 04/20/2021 0.4  0.1 - 1.0 K/uL Final  . Eosinophils Relative 04/20/2021 1  % Final  . Eosinophils Absolute 04/20/2021 0.1  0.0 - 0.5 K/uL Final  . Basophils Relative 04/20/2021 1  % Final  . Basophils Absolute 04/20/2021 0.0  0.0 - 0.1 K/uL Final  . Immature Granulocytes 04/20/2021 0  % Final  . Abs Immature Granulocytes 04/20/2021 0.01  0.00 - 0.07 K/uL Final   Performed at [facility], [place] [address], [place], [place] [address]  . Acetaminophen (Tylenol), Serum 04/20/2021 <10 (A) 10 - 30 ug/mL Final   Comment: (NOTE) Therapeutic concentrations vary significantly. A range of 10-30 ug/mL  may be an effective concentration for many patients. However, some  are best treated at concentrations outside of this range. Acetaminophen concentrations >150 ug/mL at 4 hours after ingestion  and >50 ug/mL at 12 hours after ingestion are often associated with  toxic reactions.  Performed at [facility], [place] [address], [place], [place] [address]   . Salicylate Lvl 09/38/1829 <7.0 (A) 7.0 - 30.0 mg/dL Final   Performed at [facility] [address], [place], [place] [address]  Admission on  03/11/2021, Discharged on 03/11/2021  Component Date Value Ref Range Status  . Sodium 03/11/2021 138  135 - 145 mmol/L Final  . Potassium 03/11/2021 3.7  3.5 - 5.1 mmol/L Final  . Chloride 03/11/2021 102  98 - 111 mmol/L Final  . CO2 03/11/2021 21 (A) 22 - 32 mmol/L Final  . Glucose, Bld 03/11/2021 94  70 - 99 mg/dL Final   Glucose reference range applies only to samples taken after fasting for at least 8 hours.  . BUN 03/11/2021 14  6 - 20 mg/dL Final  . Creatinine, Ser 03/11/2021 0.99  0.61 - 1.24 mg/dL Final  . Calcium 03/11/2021 10.0  8.9 - 10.3 mg/dL Final  . Total Protein 03/11/2021 8.1  6.5 - 8.1 g/dL Final  . Albumin 03/11/2021 4.7  3.5 - 5.0 g/dL Final  . AST 03/11/2021 27  15 - 41 U/L Final  . ALT 03/11/2021 15  0 - 44 U/L Final  . Alkaline Phosphatase 03/11/2021 62  38 - 126 U/L Final  . Total Bilirubin 03/11/2021 1.0  0.3 - 1.2 mg/dL Final  . GFR, Estimated 03/11/2021 >60  >60 mL/min Final   Comment: (NOTE) Calculated using the CKD-EPI Creatinine Equation (2021)   . Anion gap 03/11/2021 15  5 - 15 Final   Performed at [facility] [address] [place], [place] [address]  . Alcohol, Ethyl (B) 03/11/2021 <10  <10 mg/dL Final   Comment: (NOTE) Lowest detectable limit for serum alcohol is 10 mg/dL.  For medical purposes only. Performed at [facility], [place] [address], [place], [place] [address]   . Salicylate Lvl 75/09/2584 <7.0 (A) 7.0 - 30.0 mg/dL Final   Performed at [facility] [address], [place], [place] [address]  . Acetaminophen (Tylenol), Serum 03/11/2021 <10 (A) 10 - 30 ug/mL Final   Comment: (NOTE) Therapeutic concentrations vary significantly. A range of 10-30 ug/mL  may be an effective concentration for many patients. However, some  are best treated at concentrations outside of this range. Acetaminophen concentrations >150 ug/mL at 4 hours after ingestion  and >50 ug/mL at 12 hours after ingestion are often associated with   toxic reactions.  Performed at [facility], [place],  [place] [address]   . WBC 03/11/2021 7.3  4.0 - 10.5 K/uL Final  . RBC 03/11/2021 5.96 (A) 4.22 - 5.81 MIL/uL Final  . Hemoglobin 03/11/2021 16.6  13.0 - 17.0 g/dL Final  . HCT 03/11/2021 48.8  39.0 - 52.0 % Final  . MCV 03/11/2021 81.9  80.0 - 100.0 fL Final  . MCH 03/11/2021 27.9  26.0 - 34.0 pg Final  . MCHC 03/11/2021 34.0  30.0 - 36.0 g/dL Final  . RDW 03/11/2021 12.9  11.5 - 15.5 % Final  . Platelets 03/11/2021 366  150 - 400 K/uL Final  . nRBC 03/11/2021 0.0  0.0 - 0.2 % Final   Performed at [facility], [place] [address], [place], [place] [address]  . Opiates 03/11/2021 NONE DETECTED  NONE DETECTED Final  . Cocaine 03/11/2021 NONE DETECTED  NONE DETECTED Final  . Benzodiazepines 03/11/2021 NONE DETECTED  NONE DETECTED Final  . Amphetamines 03/11/2021 NONE DETECTED  NONE DETECTED Final  . Tetrahydrocannabinol 03/11/2021 POSITIVE (A) NONE DETECTED Final  . Barbiturates 03/11/2021 NONE DETECTED  NONE DETECTED Final   Comment: (NOTE) DRUG SCREEN FOR MEDICAL PURPOSES ONLY.  IF CONFIRMATION IS NEEDED FOR ANY PURPOSE, NOTIFY LAB WITHIN 5 DAYS.  LOWEST DETECTABLE LIMITS FOR URINE DRUG SCREEN Drug Class                     Cutoff (ng/mL) Amphetamine and metabolites    1000 Barbiturate and metabolites    200 Benzodiazepine                 175 Tricyclics and metabolites     300 Opiates and metabolites        300 Cocaine and metabolites        300 THC                            50 Performed at [facility], [place] [address], [place], [place] [address]   . SARS Coronavirus 2 by RT PCR 03/11/2021 NEGATIVE  NEGATIVE Final   Comment: (NOTE) SARS-CoV-2 target nucleic acids are NOT DETECTED.  The SARS-CoV-2 RNA is generally detectable in upper respiratory specimens during the acute phase of infection. The lowest concentration of SARS-CoV-2 viral copies this assay can detect is 138 copies/mL. A  negative result does not preclude SARS-Cov-2 infection and should not be used as the sole basis for treatment or other patient management decisions. A negative result may occur with  improper specimen collection/handling, submission of specimen other than nasopharyngeal swab, presence of viral mutation(s) within the areas targeted by this assay, and inadequate number of viral copies(<138 copies/mL). A negative result must be combined with clinical observations, patient history, and epidemiological information. The expected result is Negative.  Fact Sheet for Patients:  EntrepreneurPulse.com.au  Fact Sheet for Healthcare Providers:  IncredibleEmployment.be  This test is no                          t yet approved or cleared by the [place] FDA and  has been authorized for detection and/or diagnosis of SARS-CoV-2 by FDA under an Emergency Use Authorization (EUA). This EUA will remain  in effect (meaning this test can be used) for the duration of the COVID-19 declaration under Section 564(b)(1) of the Act, 21 U.S.C.section 360bbb-3(b)(1), unless the authorization is terminated  or revoked sooner.      . Influenza  A by PCR 03/11/2021 NEGATIVE  NEGATIVE Final  . Influenza B by PCR 03/11/2021 NEGATIVE  NEGATIVE Final   Comment: (NOTE) The Xpert Xpress SARS-CoV-2/FLU/RSV plus assay is intended as an aid in the diagnosis of influenza from Nasopharyngeal swab specimens and should not be used as a sole basis for treatment. Nasal washings and aspirates are unacceptable for Xpert Xpress SARS-CoV-2/FLU/RSV testing.  Fact Sheet for Patients: EntrepreneurPulse.com.au  Fact Sheet for Healthcare Providers: IncredibleEmployment.be  This test is not yet approved or cleared by the [place] FDA and has been authorized for detection and/or diagnosis of SARS-CoV-2 by FDA under an Emergency Use Authorization (EUA).  This EUA will remain in effect (meaning this test can be used) for the duration of the COVID-19 declaration under Section 564(b)(1) of the Act, 21 U.S.C. section 360bbb-3(b)(1), unless the authorization is terminated or revoked.  Performed at [facility], [place] [address] [place], [place] [address]     Allergies: Patient has no known allergies.  PTA Medications: (Not in a hospital admission)   Medical Decision Making  Medically cleared in ED Admit for overnight observation, plan to discharge to rescue mission tomorrow    Recommendations  Based on my evaluation the patient does not appear to have an emergency medical condition.  Lewis Shock, FNP 06/09/21  5:48 PM

## 2021-06-09 NOTE — ED Notes (Signed)
Pt asleep in bed. Respirations even and unlabored. Will continue to monitor for safety. ?

## 2021-06-09 NOTE — ED Provider Notes (Addendum)
[facility] EMERGENCY DEPARTMENT Provider Note   CSN: [id number] Arrival date & time: 06/08/21  2153     History No chief complaint on file.   Tyler White is a 25 y.o. male with history of schizoaffective disorder, homelessness, polysubstance abuse presents to ER to "speak to behavioral".  States 3 days ago started having thoughts of suicide and homicide.  No plan. Also having visual hallucinations, "flashes'.  Gets invega injections but does not take any other oral medicines daily. Denies alcohol or recreational drug use. No other concerns. Denies attempts to self harm or harm others.   HPI     Past Medical History:  Diagnosis Date   Hernia, inguinal, right    Inguinal hernia    right   Psychiatric illness    Schizophrenia [facility])     Patient Active Problem List   Diagnosis Date Noted   Schizoaffective disorder, bipolar type ([name]) 04/26/2021   Marijuana abuse 04/26/2021   Polysubstance abuse ([place]) 04/23/2021   Homelessness 04/23/2021   Schizophrenia ([place]) 11/19/2020   Cocaine abuse ([place]) 02/19/2018   Cocaine abuse with cocaine-induced mood disorder ([place]) 02/19/2018   Psychosis ([place]) 11/27/2017   Cannabis abuse with psychotic disorder, with delusions ([place]) 11/26/2017   Schizophrenia, unspecified ([place]) 11/26/2017    History reviewed. No pertinent surgical history.     Family History  Problem Relation Age of Onset   Psychiatric Illness Mother    Hypertension Sister     Social History   Tobacco Use   Smoking status: Every Day    Packs/day: 0.50    Years: 5.00    Pack years: 2.50    Types: Cigarettes   Smokeless tobacco: Never  Vaping Use   Vaping Use: Never used  Substance Use Topics   Alcohol use: Yes   Drug use: Yes    Types: Marijuana, Cocaine    Home Medications Prior to Admission medications   Medication Sig Start Date End Date Taking? Authorizing Provider  benztropine (COGENTIN) 0.5 MG tablet Take 1 tablet (0.5 mg total)  by mouth 2 (two) times daily as needed for tremors. 04/30/21   Ethelene Hal, NP  gabapentin (NEURONTIN) 400 MG capsule Take 1 capsule (400 mg total) by mouth 3 (three) times daily. 04/30/21   Ethelene Hal, NP  hydrOXYzine (ATARAX/VISTARIL) 25 MG tablet Take 1 tablet (25 mg total) by mouth 3 (three) times daily as needed for anxiety. 04/30/21   Ethelene Hal, NP  paliperidone (INVEGA SUSTENNA) 156 MG/ML SUSY injection Inject 1 mL (156 mg total) into the muscle once for 1 dose. Next dose is due 05/27/2021 04/30/21 04/30/21  Ethelene Hal, NP  risperiDONE (RISPERDAL M-TABS) 2 MG disintegrating tablet Take 1 tablet (2 mg total) by mouth at bedtime. 04/30/21   Ethelene Hal, NP  traZODone (DESYREL) 50 MG tablet Take 1 tablet (50 mg total) by mouth at bedtime as needed for sleep. 04/30/21   Ethelene Hal, NP  Vitamin D, Ergocalciferol, (DRISDOL) 1.25 MG (50000 UNIT) CAPS capsule Take 1 capsule (50,000 Units total) by mouth every 7 (seven) days. Next dose due on 05/02/2021 05/02/21   Ethelene Hal, NP    Allergies    Patient has no known allergies.  Review of Systems   Review of Systems  Psychiatric/Behavioral:  Positive for suicidal ideas (and homicide ideation).    Physical Exam Updated Vital Signs BP 108/65 (BP Location: Left Arm)   Pulse (!) 54   Temp 97.9 F (36.6  C)   Resp 15   Ht 5\' 8"  (1.727 m)   Wt 67 kg   SpO2 99%   BMI 22.46 kg/m   Physical Exam Vitals and nursing note reviewed.  Constitutional:      General: He is not in acute distress.    Appearance: He is well-developed.     Comments: NAD.  HENT:     Head: Normocephalic and atraumatic.     Right Ear: External ear normal.     Left Ear: External ear normal.     Nose: Nose normal.  Eyes:     General: No scleral icterus.    Conjunctiva/sclera: Conjunctivae normal.  Cardiovascular:     Rate and Rhythm: Normal rate and regular rhythm.     Heart sounds: Normal heart sounds. No murmur  heard. Pulmonary:     Effort: Pulmonary effort is normal.     Breath sounds: Normal breath sounds. No wheezing.  Musculoskeletal:        General: No deformity. Normal range of motion.     Cervical back: Normal range of motion and neck supple.  Skin:    General: Skin is warm and dry.     Capillary Refill: Capillary refill takes less than 2 seconds.  Neurological:     Mental Status: He is alert and oriented to person, place, and time.  Psychiatric:        Behavior: Behavior normal.        Thought Content: Thought content includes homicidal and suicidal ideation.        Judgment: Judgment normal.     Comments: Poor eye contact. Speech normal. Endorsing SI, HI, AVH. Does not appear to be responding to internal stimuli.     ED Results / Procedures / Treatments   Labs (all labs ordered are listed, but only abnormal results are displayed) Labs Reviewed  CBC WITH DIFFERENTIAL/PLATELET - Abnormal; Notable for the following components:      Result Value   HCT 38.6 (*)    All other components within normal limits  SALICYLATE LEVEL - Abnormal; Notable for the following components:   Salicylate Lvl <1.7 (*)    All other components within normal limits  ACETAMINOPHEN LEVEL - Abnormal; Notable for the following components:   Acetaminophen (Tylenol), Serum <10 (*)    All other components within normal limits  RAPID URINE DRUG SCREEN, HOSP PERFORMED - Abnormal; Notable for the following components:   Cocaine POSITIVE (*)    Tetrahydrocannabinol POSITIVE (*)    All other components within normal limits  ETHANOL  COMPREHENSIVE METABOLIC PANEL    EKG None  Radiology No results found.  Procedures Procedures   Medications Ordered in ED Medications - No data to display  ED Course  I have reviewed the triage vital signs and the nursing notes.  Pertinent labs & imaging results that were available during my care of the patient were reviewed by me and considered in my medical decision  making (see chart for details).  Clinical Course as of 06/09/21 1511  Sat Jun 09, 2021  1137 COCAINE(!): POSITIVE [CG]  1137 Tetrahydrocannabinol(!): POSITIVE [CG]    Clinical Course User Index [CG] Arlean Hopping   MDM Rules/Calculators/A&P                          25 yo M with past psych history here for SI, HI and visual hallucinations.  Denies ETOH or recreational drug use however UDS shows  otherwise. +THC and cocaine. No medical complaints. Vitals normal. ER work up initiated in triage. This was personally reviewed and interpreted, essentially unremarkable and/or at baseline. TTS has evaluated patient and recommends transfer to [facility].  He is at this point medically cleared and appropriate for transfer to [facility] for further management.  Discussed plan with patient who is in agreement.  Sitter at bedside.   1510: I spoke to [place] NP states she is working on getting patient transported to [facility] Final Clinical Impression(s) / ED Diagnoses Final diagnoses:  None    Rx / DC Orders ED Discharge Orders     None        Arlean Hopping 06/09/21 1140    Orpah Greek, MD 06/09/21 1455    Kinnie Feil, PA-C 06/09/21 1512    Orpah Greek, MD 06/09/21 1611

## 2021-06-09 NOTE — ED Notes (Signed)
Pt admitted to overnight observation due to endorsing SI no plan. Pt reports seeing "flashes". Pt homeless. Pt stated, "I'm suicidal because I am having problems getting home to my family in [place]. If I was there, I wouldn't be in the predicament I'm in now". Pt unable to contract for safety if discharged. Support given. Oriented to unit and unit rules. Will monitor for safety.

## 2021-06-09 NOTE — BH Assessment (Addendum)
Comprehensive Clinical Assessment (CCA) Note  06/09/2021 Tyler White [id number]  Disposition: Merlyn Lot, PMHNP recommends patient be transferred to [facility]. [facility] provider reviewing patient. 1:1 sitter recommended.  The patient demonstrates the following risk factors for suicide: Chronic risk factors for suicide include: psychiatric disorder of schizoaffective disorder, substance use disorder, and demographic factors (male, >69 y/o). Acute risk factors for suicide include: unemployment, social withdrawal/isolation, and loss (financial, interpersonal, professional). Protective factors for this patient include:  none . Considering these factors, the overall suicide risk at this point appears to be moderate. Patient is not appropriate for outpatient follow up.   [place] ED from 06/08/2021 in [place] ED from 06/05/2021 in [place] ED from 05/15/2021 in [place] Low Risk No Risk High Risk       Patient is a 25 year old male presenting voluntarily to [facility] for psychiatric evaluation for SI/HI. Patient accessed Tyler White on 6/8 for Invega injection. Patient states he has felt suicidal for several days due to "not being able to get food." He denies any specific plan. Patient states he has not had his oral medications in over one month. Patient endorses current HI but denies this is toward a specific person and does not have a plan. Patient reports AH telling him he is hallucinating and VH of "flashes." His thoughts do seem blocked during evaluation. Patient cannot contract for safety. He also is requesting social worker help him get in touch with the [facility], who are assisting him with disability. He states he does not have any natural supports locally for this counselor to contact for collateral information. Patient reports nearly daily crack cocaine use but  states he has not used in roughly 2 days.  Chief Complaint: suicidal, psychosis  Visit Diagnosis: Schizoaffective Disorder, Bipolar type Cocaine use disorder, severe    CCA Screening, Triage and Referral (STR)  Patient Reported Information How did you hear about [place]? Self  What Is the Reason for Your Visit/Call Today? suicidal  How Long Has This Been Causing You Problems? > than 6 months  What Do You Feel Would Help You the Most Today? Treatment for Depression or other mood problem; Medication(s)   Have You Recently Had Any Thoughts About Hurting Yourself? Yes  Are You Planning to Commit Suicide/Harm Yourself At This time? No   Have you Recently Had Thoughts About [place]? No  Are You Planning to Harm Someone at This Time? No  Explanation: No data recorded  Have You Used Any Alcohol or Drugs in the Past 24 Hours? No  How Long Ago Did You Use Drugs or Alcohol? 0000 (UTA)  What Did You Use and How Much? unknown amount of crack cocaine last night   Do You Currently Have a Therapist/Psychiatrist? Yes  Name of Therapist/Psychiatrist: [place] Recently Discharged From Any Office Practice or Programs? No  Explanation of Discharge From Practice/Program: No data recorded    CCA Screening Triage Referral Assessment Type of Contact: Face-to-Face  Telemedicine Service Delivery:   Is this Initial or Reassessment? Initial Assessment  Date Telepsych consult ordered in [facility]:  06/08/21  Time Telepsych consult ordered in [facility]:  2249  Location of Assessment: [facility] ED  Provider Location: [facility] Services   Collateral Involvement: None   Does Patient Have a [place]? No data recorded Name and Contact of Legal Guardian:  No data recorded If Minor and Not Living with Parent(s), Who has Custody? No data recorded Is CPS involved or ever been involved? Never  Is APS involved or ever been involved? Never   Patient  Determined To Be At Risk for Harm To Self or Others Based on Review of Patient Reported Information or Presenting Complaint? Yes, for Self-Harm  Method: No data recorded Availability of Means: No data recorded Intent: No data recorded Notification Required: No data recorded Additional Information for Danger to Others Potential: No data recorded Additional Comments for Danger to Others Potential: No data recorded Are There Guns or Other Weapons in Your Home? No data recorded Types of Guns/Weapons: No data recorded Are These Weapons Safely Secured?                            No data recorded Who Could Verify You Are Able To Have These Secured: No data recorded Do You Have any Outstanding Charges, Pending Court Dates, Parole/Probation? No data recorded Contacted To Inform of Risk of Harm To Self or Others: Unable to Contact: (NA)    Does Patient Present under Involuntary Commitment? No  IVC Papers Initial File Date: No data recorded  [place] of Residence: [place]   Patient Currently Receiving the Following Services: Medication Management   Determination of Need: Urgent (48 hours)   Options For Referral: [facility]     CCA Biopsychosocial Patient Reported Schizophrenia/Schizoaffective Diagnosis in Past: No   Strengths: Not assessed.   Mental Health Symptoms Depression:   Difficulty Concentrating; Sleep (too much or little); Irritability; Tearfulness; Fatigue; Hopelessness; Worthlessness   Duration of Depressive symptoms:    Mania:   N/A   Anxiety:    Difficulty concentrating; Sleep   Psychosis:   Hallucinations; Delusions   Duration of Psychotic symptoms:  Duration of Psychotic Symptoms: Greater than six months   Trauma:   None   Obsessions:   None   Compulsions:   None   Inattention:   None   Hyperactivity/Impulsivity:   N/A   Oppositional/Defiant Behaviors:   None   Emotional Irregularity:   None   Other Mood/Personality Symptoms:    NA    Mental Status Exam Appearance and self-care  Stature:   Average   Weight:   Average weight   Clothing:   -- (Pt in scrubs.)   Grooming:   Normal (scrubs)   Cosmetic use:   None   Posture/gait:   Slumped   Motor activity:   Slowed   Sensorium  Attention:   Normal   Concentration:   Normal   Orientation:   Person; Place; Situation; Time   Recall/memory:   Normal   Affect and Mood  Affect:   Flat   Mood:   Dysphoric   Relating  Eye contact:   Normal   Facial expression:   Constricted (Sleep.)   Attitude toward examiner:   Guarded   Thought and Language  Speech flow:  Slow; Soft   Thought content:   Appropriate to Mood and Circumstances   Preoccupation:   None   Hallucinations:   Auditory; Visual   Organization:  No data recorded  [employer] of Knowledge:   Average   Intelligence:   Average   Abstraction:   Normal   Judgement:   Impaired   Reality Testing:   Realistic   Insight:   Lacking   Decision Making:   Only simple  Social Functioning  Social Maturity:   Isolates   Social Judgement:   "Games developer"   Stress  Stressors:   Housing; Museum/gallery curator; Illness   Coping Ability:   Deficient supports   Skill Deficits:   Self-care; Decision making   Supports:   Support needed     Religion: Religion/Spirituality Are You A Religious Person?: No How Might This Affect Treatment?: NA  Leisure/Recreation: Leisure / Recreation Do You Have Hobbies?: No  Exercise/Diet: Exercise/Diet Do You Exercise?: No Have You Gained or Lost A Significant Amount of Weight in the Past Six Months?: No Do You Follow a Special Diet?: No Do You Have Any Trouble Sleeping?: Yes Explanation of Sleeping Difficulties: Pt reports, his sleep was not good.   CCA Employment/Education Employment/Work Situation: Employment / Work Situation Employment Situation: Unemployed Patient's Job has Been Impacted by  Current Illness: No Has Patient ever Been in Passenger transport manager?: No  Education: Education Last Grade Completed: 36 Did [place]?: No Did You Have An Individualized Education Program (IIEP): No Did You Have Any Difficulty At [employer]?: No   CCA Family/Childhood History Family and Relationship History: Family history Marital status: Single Does patient have children?: Yes How many children?: 1 How is patient's relationship with their children?: Pt reports having a 62 year old daughter who lives with her mother  Childhood History:  Childhood History By whom was/is the patient raised?: Mother Description of patient's current relationship with siblings: "Fine" Did patient suffer any verbal/emotional/physical/sexual abuse as a child?: No Did patient suffer from severe childhood neglect?: No Has patient ever been sexually abused/assaulted/raped as an adolescent or adult?: No Was the patient ever a victim of a crime or a disaster?: No Witnessed domestic violence?: No Has patient been affected by domestic violence as an adult?: No  Child/Adolescent Assessment:     CCA Substance Use Alcohol/Drug Use: Alcohol / Drug Use Pain Medications: See MAR Prescriptions: See MAR Over the Counter: See MAR History of alcohol / drug use?: Yes Longest period of sobriety (when/how long): none reported Negative Consequences of Use: Financial, Personal relationships Substance #1 Name of Substance 1: Crack cocaine. 1 - Age of First Use: UTA 1 - Amount (size/oz): Pt reported, "I don't know." 1 - Frequency: daily 1 - Duration: Ongoing. 1 - Last Use / Amount: 6/6 unknown amount 1 - Method of Aquiring: purchased                       ASAM's:  Six Dimensions of Multidimensional Assessment  Dimension 1:  Acute Intoxication and/or Withdrawal Potential:   Dimension 1:  Description of individual's past and current experiences of substance use and withdrawal: Patient does not report  experiencing any complications with withdrawal symptoms.  Dimension 2:  Biomedical Conditions and Complications:      Dimension 3:  Emotional, Behavioral, or Cognitive Conditions and Complications:  Dimension 3:  Description of emotional, behavioral, or cognitive conditions and complications: Pt is diagnosed with Schizoaffective Disorder. Pt not currently receiving therapy and medication management.  Dimension 4:  Readiness to Change:  Dimension 4:  Description of Readiness to Change criteria: Pt reports, wanting inpatient treatment.  Dimension 5:  Relapse, Continued use, or Continued Problem Potential:  Dimension 5:  Relapse, continued use, or continued problem potential critiera description: Patient has no significant period of abstinence.  Dimension 6:  Recovery/Living Environment:  Dimension 6:  Recovery/Iiving environment criteria description: Homeless with no supports.  ASAM Severity Score: ASAM's Severity Rating Score:  12  ASAM Recommended Level of Treatment: ASAM Recommended Level of Treatment: Level II Intensive Outpatient Treatment   Substance use Disorder (SUD) Substance Use Disorder (SUD)  Checklist Symptoms of Substance Use: Continued use despite having a persistent/recurrent physical/psychological problem caused/exacerbated by use, Continued use despite persistent or recurrent social, interpersonal problems, caused or exacerbated by use, Social, occupational, recreational activities given up or reduced due to use, Large amounts of time spent to obtain, use or recover from the substance(s)  Recommendations for Services/Supports/Treatments: Recommendations for Services/Supports/Treatments Recommendations For Services/Supports/Treatments: Inpatient Hospitalization  Discharge Disposition:    DSM5 Diagnoses: Patient Active Problem List   Diagnosis Date Noted   Schizoaffective disorder, bipolar type ([place]) 04/26/2021   Marijuana abuse 04/26/2021   Polysubstance abuse ([place]) 04/23/2021    Homelessness 04/23/2021   Schizophrenia ([place]) 11/19/2020   Cocaine abuse ([place]) 02/19/2018   Cocaine abuse with cocaine-induced mood disorder ([place]) 02/19/2018   Psychosis ([place]) 11/27/2017   Cannabis abuse with psychotic disorder, with delusions ([place]) 11/26/2017   Schizophrenia, unspecified ([place]) 11/26/2017     Referrals to Alternative Service(s): Referred to Alternative Service(s):   Place:   Date:   Time:    Referred to Alternative Service(s):   Place:   Date:   Time:    Referred to Alternative Service(s):   Place:   Date:   Time:    Referred to Alternative Service(s):   Place:   Date:   Time:     Orvis Brill, LCSW

## 2021-06-09 NOTE — ED Notes (Signed)
Locker #9

## 2021-06-09 NOTE — ED Notes (Signed)
Belongings triaged and placed in locker 3. Envelope of valuables with security.

## 2021-06-10 MED ORDER — PALIPERIDONE PALMITATE ER 156 MG/ML IM SUSY: 156.0000 mg | PREFILLED_SYRINGE | INTRAMUSCULAR | Status: DC

## 2021-06-10 NOTE — Progress Notes (Signed)
Probation officer contacted [employer] and [employer] at the provider's request to inquire about available shelter beds. It was reported by [facility] the patient has a history of attending the [facility] and can not return until 10/20/2021 and would have to go before the board for reason not disclosed. Further, Probation officer contacted [employer], however was unable to make contact with anyone. Writer provided the following resources listed below to the patient:   [facility] - Admissions are currently completed Monday through Friday at [place]; both appointments and walk-ins are accepted.  Any individual that is a [facility] resident may present for a substance abuse screening and assessment for admission.  A person may be referred by numerous sources or self-refer.   Potential clients will be screened for medical necessity and appropriateness for the program.  Clients must meet criteria for high-intensity residential treatment services.  If clinically appropriate, a client will continue with the comprehensive clinical assessment and intake process, as well as enrollment in the [facility].  Address: [address] [place], [place] [address] Admin Hours: Mon-Fri 8AM to [place] Hours: 24/7 Phone: [phone] Fax: [phone]  [facility]Det[facility]) Facility Based Crisis:  These are 3 locations for services: Please call before arrival   Address: [address] [place], [place] [address] Phone: [phone]  Address: [address] [place], [place] [address] Phone#: [phone]  Address: [address] [place]is [place]iffle [place], [place] [address] Phone#: [phone]   [facility] ([facility]): (offers outpatient therapy and intensive outpatient substance abuse therapy).  [address], [place], [place] [address] Phone: [phone]  [facility]: Offers FREE recovery skills classes,  support groups, 1:1 Peer Support, and Compeer Classes. [address] [place], [place] [address] Phone: [phone] (Call to complete intake).  [facility] [address] [place], [place] [address] Phone: [phone] ext: [place] provides food, shelter and other programs and services to the homeless men of [place] through our [employer].  By offering safe shelter, three meals a day, clean clothing, Biblical counseling, financial planning, vocational training, GED/education and employment assistance, we've helped mend the shattered lives of many homeless men since opening in 1974.  We have approximately 267 beds available, with a max of 312 beds including mats for emergency situations and currently house an average of 270 men a night.  Prospective Client Check-In Information Photo ID Required (State/ Out of State/ [facility]) - if photo ID is not available, clients are required to have a printout of a police/sheriff's criminal history report. Help out with chores around the [place]. No sex offender of any type (pending, charged, registered and/or any other sex related offenses) will be permitted to check in. Must be willing to abide by all rules, regulations, and policies established by the [employer]. The following will be provided - shelter, food, clothing, and biblical counseling. If you or someone you know is in need of assistance at our [facility] shelter in [place], [place], please call [phone] ext. [phone].   [place]: Phone: [phone]   [facility]-will provide timely access to mental health services for children and adolescents (4-17) and adults presenting in a mental health crisis. The program is designed for those who need urgent Behavioral Health or Substance Use treatment and are not experiencing a medical crisis that would typically require an  emergency room  visit.    [place], [place] [address] Phone: [phone] [email or web address]: Phone#: [phone]  The [facility] SA Intensive Outpatient Program (SAIOP) means structured individual and group addiction activities and services that are provided at an outpatient program designed to assist adult and adolescent consumers to begin recovery and learn skills for recovery maintenance. The [place] program is offered at least 3 hours a day, 3 days a week.SAIOP services shall include a structured program consisting of, but not limited to, the following services: Individual counseling and support; Group counseling and support; Family counseling, training or support; Biochemical assays to identify recent drug use (e.g., urine drug screens); Strategies for relapse prevention to include community and social support systems in treatment; Life skills; Crisis contingency planning; Disease Management; and Treatment support activities that have been adapted or specifically designed for persons with physical disabilities, or persons with co-occurring disorders of mental illness and substance abuse/dependence or mental retardation/developmental disability and substance abuse/dependence. Phone: [phone]  Address:   The [facility] will also offer the following outpatient services: (Monday through Friday 8am-5pm)   Partial Hospitalization Program (PHP) Substance Abuse Intensive Outpatient Program (SA-IOP) Group Therapy Medication Management Peer Living Room We also provide (24/7):  Assessments: Our mental health clinician and providers will conduct a focused mental health evaluation, assessing for immediate safety concerns and further mental health needs. Referral: Our team will provide resources and help connect to community based mental health treatment, when indicated, including psychotherapy, psychiatry, and other  specialized behavioral health or substance use disorder services (for those not already in treatment). Transitional Care: Our team providers in person bridging and/or telephonic follow-up during the patient's transition to outpatient services.  The [facility] 24-Hour Call Center: [phone] Behavioral Health Crisis Line: [phone]  Homeless Shelter List:   [place], [place] Phone: [facility] has been providing emergency shelter to those in need of a permanent residence for over 35 years. The [employer] shelter plays an important role in our community.    There are many life events that can pull someone into a downward spiral towards poverty that is very difficult to get out of. Homelessness is a problem that can affect anyone of [place]. [facility] is a safe and comforting place to stay, especially if you have experienced the hardship of street life.    [facility] provides a single bed and bedding to 100 adult men and women. The shelter welcomes all who are in need of housing, no one in real need is turned away unless space is not available.    While staying at [facility], guests are offered more than just a bed for a night. Hot meals are provided and every guest has access to case management services. Case managers provide assistance with finding housing, employment, or other services that will help them gain stability. Continuous stay is based on availability, capacity, and progress towards goals.    To contact the front desk of [facility] please call   [phone] ext [phone] or ext. [phone].   [facility] [address], [place], [place] [address] Phone: [phone]  [facility] [address], [place] [address] Phone: [phone]   [place]: [phone]. [address] [place], [place] [address] Phone: [facility]  [place] [address], [place], [place] [address] Check in at 6:00PM for placement at a local shelter) Phone: [place], MSW, [place] Master Phone: [phone] Disposition/TOC

## 2021-06-10 NOTE — ED Notes (Signed)
Resting with eyes closed. Rise and fall of chest noted. No new issues at this time. Will continue to monitor for safety

## 2021-06-10 NOTE — ED Provider Notes (Signed)
FBC/OBS ASAP Discharge Summary  Date and Time: 06/10/2021 10:15 AM  Name: Tyler White  MRN:  [id number]   Discharge Diagnoses:  Final diagnoses:  Schizoaffective disorder, bipolar type ([place])  Cocaine abuse ([place])    Subjective: Patient reports that he is doing okay today.  Patient reports that he slept well last night and has a good appetite.  Patient denies any suicidal or homicidal ideations and denies any hallucinations.  We discussed him going to rescue mission today and he stated he was still interested.  He also requested to speak to social worker about something with his disability that I am not familiar with.  Patient was asked if he was interested in substance abuse treatment in a refused stating that he just needs housing.  Patient states that if he can get into a rescue mission that he was just discharged to one of the local shelters and continue working with outpatient resources that he has had in the past.  Stay Summary: Patient is a 25 year old male who presented to the [facility] as a transfer [facility] ED with reported suicidal and homicidal ideations in the emergency department.  Patient has a diagnosed history of schizoaffective disorder bipolar type and cocaine use disorder.  Patient presented to the [facility] reporting passive suicidal ideations due to not been in contact with family because he does not have a phone as well as being homeless.  Patient states that if he got some sleep that he would feel better.  Confirmed with patient that he received his [place] injection on 06/06/2021 and is due again on 07/04/2021.  Patient was admitted to the continuous observation unit for overnight assessment.  Patient slept well and showed no signs of aggression, agitation, or psychotic symptoms.  Today the patient denied having any suicidal or homicidal ideations and denied any hallucinations.  Social worker contacted [employer] and found that patient was banned from [employer] until October 20, 2021 and then he would have to go in front of the board before he could ever be accepted there again.  They have attempted to contact [employer] but have not had a response back yet.  Patient stated that he was working with servant to get assistance with housing, disability, and insurance.  Patient stated that he was still safe for discharge and denied any suicidal or homicidal ideations and denied any hallucinations.  Patient will be provided with outpatient resources and prescription for Invega Sustenna 156 mg IM injection has been a prescribed to pharmacy of choice.  Patient was informed to follow up with outpatient services provided with open access hours of operation at the [facility].  Total Time spent with patient: 30 minutes  Past Psychiatric History: Schizophrenia, cocaine abuse, previous hospitalizations Past Medical History:  Past Medical History:  Diagnosis Date   Hernia, inguinal, right    Inguinal hernia    right   Psychiatric illness    Schizophrenia ([place])    No past surgical history on file. Family History:  Family History  Problem Relation Age of Onset   Psychiatric Illness Mother    Hypertension Sister    Family Psychiatric History: None reported Social History:  Social History   Substance and Sexual Activity  Alcohol Use Yes     Social History   Substance and Sexual Activity  Drug Use Yes   Types: Marijuana, Cocaine    Social History   Socioeconomic History   Marital status:  Single    Spouse name: Not on file   Number of children: Not on file   Years of education: 10   Highest education level: Not on file  Occupational History   Not on file  Tobacco Use   Smoking status: Every Day    Packs/day: 0.50    Years: 5.00    Pack years: 2.50    Types: Cigarettes   Smokeless tobacco: Never  Vaping Use   Vaping Use: Never used  Substance and Sexual Activity   Alcohol use: Yes    Drug use: Yes    Types: Marijuana, Cocaine   Sexual activity: Yes    Birth control/protection: None  Other Topics Concern   Not on file  Social History Narrative   ** Merged History Encounter **       ** Merged History Encounter **       Social Determinants of Health   Financial Resource Strain: Not on file  Food Insecurity: Not on file  Transportation Needs: Not on file  Physical Activity: Not on file  Stress: Not on file  Social Connections: Not on file   SDOH:  SDOH Screenings   Alcohol Screen: Low Risk    Last Alcohol Screening Score (AUDIT): 0  Depression (PHQ2-9): Medium Risk   PHQ-2 Score: 17  Financial Resource Strain: Not on file  Food Insecurity: Not on file  Housing: Not on file  Physical Activity: Not on file  Social Connections: Not on file  Stress: Not on file  Tobacco Use: High Risk   Smoking Tobacco Use: Every Day   Smokeless Tobacco Use: Never  Transportation Needs: Not on file    Tobacco Cessation:  A prescription for an FDA-approved tobacco cessation medication was offered at discharge and the patient refused  Current Medications:  Current Facility-Administered Medications  Medication Dose Route Frequency Provider Last Rate Last Admin   acetaminophen (TYLENOL) tablet 650 mg  650 mg Oral Q6H PRN Azriel Dancy, Lowry Ram, FNP       alum & mag hydroxide-simeth (MAALOX/MYLANTA) 200-200-20 MG/5ML suspension 30 mL  30 mL Oral Q4H PRN Trinidad Petron, Darnelle Maffucci B, FNP       hydrOXYzine (ATARAX/VISTARIL) tablet 25 mg  25 mg Oral TID PRN Tyquavious Gamel, Lowry Ram, FNP       magnesium hydroxide (MILK OF MAGNESIA) suspension 30 mL  30 mL Oral Daily PRN Dysen Edmondson, Lowry Ram, FNP       [START ON 07/04/2021] paliperidone (INVEGA SUSTENNA) injection 156 mg  156 mg Intramuscular Q28 days Maryon Kemnitz, Darnelle Maffucci B, FNP       traZODone (DESYREL) tablet 50 mg  50 mg Oral QHS PRN Artha Chiasson, Lowry Ram, FNP       No current outpatient medications on file.    PTA Medications: (Not in a hospital  admission)   Musculoskeletal  Strength & Muscle Tone: within normal limits Gait & Station: normal Patient leans: N/A  Psychiatric Specialty Exam  Presentation  General Appearance: Appropriate for Environment; Disheveled  Eye Contact:Good  Speech:Clear and Coherent; Normal Rate  Speech Volume:Normal  Handedness:Right   Mood and Affect  Mood:Euthymic  Affect:Appropriate; Congruent   Thought Process  Thought Processes:Coherent  Descriptions of Associations:Intact  Orientation:Full (Time, Place and Person)  Thought Content:WDL  Diagnosis of Schizophrenia or Schizoaffective disorder in past: Yes  Duration of Psychotic Symptoms: Greater than six months   Hallucinations:Hallucinations: None  Ideas of Reference:None  Suicidal Thoughts:Suicidal Thoughts: No SI Passive Intent and/or Plan: Without Intent; Without Plan  Homicidal Thoughts:Homicidal Thoughts:  No   Sensorium  Memory:Immediate Good; Recent Good; Remote Good  Judgment:Fair  Insight:Good   Executive Functions  Concentration:Good  Attention Span:Good  [place] of Knowledge:Good  Language:Good   Psychomotor Activity  Psychomotor Activity:Psychomotor Activity: Normal   Assets  Assets:Communication Skills; Desire for Improvement; Financial Resources/Insurance; Physical Health   Sleep  Sleep:Sleep: Good   Nutritional Assessment (For OBS and FBC admissions only) Has the patient had a weight loss or gain of 10 pounds or more in the last 3 months?: No Has the patient had a decrease in food intake/or appetite?: No Does the patient have dental problems?: No Does the patient have eating habits or behaviors that may be indicators of an eating disorder including binging or inducing vomiting?: No Has the patient recently lost weight without trying?: No Has the patient been eating poorly because of a decreased appetite?: No Malnutrition Screening Tool Score: 0    Physical Exam   Physical Exam Vitals and nursing note reviewed.  Constitutional:      Appearance: He is well-developed.  HENT:     Head: Normocephalic.  Eyes:     Pupils: Pupils are equal, round, and reactive to light.  Cardiovascular:     Rate and Rhythm: Normal rate.  Pulmonary:     Effort: Pulmonary effort is normal.  Musculoskeletal:        General: Normal range of motion.  Neurological:     Mental Status: He is alert and oriented to person, place, and time.   Review of Systems  Constitutional: Negative.   HENT: Negative.    Eyes: Negative.   Respiratory: Negative.    Cardiovascular: Negative.   Gastrointestinal: Negative.   Genitourinary: Negative.   Musculoskeletal: Negative.   Skin: Negative.   Neurological: Negative.   Endo/Heme/Allergies: Negative.   Psychiatric/Behavioral:  Positive for substance abuse.   Blood pressure 113/69, pulse 61, temperature 98.7 F (37.1 C), temperature source Oral, resp. rate 16, SpO2 100 %. There is no height or weight on file to calculate BMI.  Demographic Factors:  Male, Low socioeconomic status, Living alone, and Unemployed  Loss Factors: NA  Historical Factors: NA  Risk Reduction Factors:   Positive coping skills or problem solving skills  Continued Clinical Symptoms:  Alcohol/Substance Abuse/Dependencies Previous Psychiatric Diagnoses and Treatments  Cognitive Features That Contribute To Risk:  None    Suicide Risk:  Minimal: No identifiable suicidal ideation.  Patients presenting with no risk factors but with morbid ruminations; may be classified as minimal risk based on the severity of the depressive symptoms  Plan Of Care/Follow-up recommendations:  Continue activity as tolerated. Continue diet as recommended by your PCP. Ensure to keep all appointments with outpatient providers.   Disposition: Discharge to shelter  Lewis Shock, [place] 06/10/2021, 10:15 AM

## 2021-06-10 NOTE — ED Notes (Signed)
MEAL GIVEN

## 2021-06-10 NOTE — ED Notes (Signed)
Ambulated per self to retrieve belongings. No SI, HI, or AVH noted. No s/s pain, discomfort, or acute distress noted. Educated on d/c instructions and medications. Verbalized understanding. Escorted to front lobby. D/c into own custody. Stable at time of d/c. Bus pass given for transportation.

## 2021-06-10 NOTE — ED Notes (Signed)
Pt asleep in bed. Respirations even and unlabored. Will continue to monitor for safety. ?

## 2021-06-10 NOTE — Progress Notes (Signed)
Writer spoke with the patient in reference to providing resources. The patient reported that he is connected to the [facility] and a case worker from there is assisting with him getting disability. Writer discussed [facility] and the additional resources provided through [facility]. Probation officer notified provider of encounter.  Glennie [place], MSW, [place], LCAS-A Phone: [phone] Disposition/TOC

## 2021-06-10 NOTE — Discharge Instructions (Addendum)
The [facility]. Address: [address] [place], [place] [address]   Phone: [phone]  [facility] - Admissions are currently completed Monday through Friday at [place]; both appointments and walk-ins are accepted.  Any individual that is a [facility] resident may present for a substance abuse screening and assessment for admission.  A person may be referred by numerous sources or self-refer.   Potential clients will be screened for medical necessity and appropriateness for the program.  Clients must meet criteria for high-intensity residential treatment services.  If clinically appropriate, a client will continue with the comprehensive clinical assessment and intake process, as well as enrollment in the [facility].  Address: [address] [place], [place] [address] Admin Hours: Mon-Fri 8AM to [place] Hours: 24/7 Phone: [phone] Fax: [phone]  [facility]Det[facility]) Facility Based Crisis:  These are 3 locations for services: Please call before arrival   Address: [address] [place], [place] [address] Phone: [phone]  Address: [address] [place], [place] [address] Phone#: [phone]  Address: [address] [place], [place] [address] Phone#: [phone]   [facility] ([facility]): (offers outpatient therapy and intensive outpatient substance abuse therapy).  [address], [place], [place] [address] Phone: [phone]  [facility]: Offers FREE recovery skills classes, support groups, 1:1 Peer Support, and Compeer Classes. [address] [place], [place] [address] Phone: [phone] (Call to complete intake).  [facility] [address] [place],  [address] Phone: [phone] ext: [facility] provides food, shelter and other programs and services to the homeless men of [place] through  our [employer].  By offering safe shelter, three meals a day, clean clothing, Biblical counseling, financial planning, vocational training, GED/education and employment assistance, we've helped mend the shattered lives of many homeless men since opening in 1974.  We have approximately 267 beds available, with a max of 312 beds including mats for emergency situations and currently house an average of 270 men a night.  Prospective Client Check-In Information Photo ID Required (State/ Out of State/ [facility]) - if photo ID is not available, clients are required to have a printout of a police/sheriff's criminal history report. Help out with chores around the [place]. No sex offender of any type (pending, charged, registered and/or any other sex related offenses) will be permitted to check in. Must be willing to abide by all rules, regulations, and policies established by the [employer]. The following will be provided - shelter, food, clothing, and biblical counseling. If you or someone you know is in need of assistance at our [facility] shelter in [place], [place], please call [phone] ext. [phone].   [place]: Phone: [phone]   [facility]-will provide timely access to mental health services for children and adolescents (4-17) and adults presenting in a mental health crisis. The program is designed for those who need urgent Behavioral Health or Substance Use treatment and are not experiencing a medical crisis that would typically require an emergency room visit.    [place],  [address] Phone: [phone] [email or web address]: Phone#: [phone]  The [facility] SA Intensive Outpatient Program (SAIOP) means structured individual and group addiction activities and services that are provided at an outpatient program designed to assist adult and adolescent consumers to  begin recovery and learn skills  for recovery maintenance. The [place] program is offered at least 3 hours a day, 3 days a week.SAIOP services shall include a structured program consisting of, but not limited to, the following services: Individual counseling and support; Group counseling and support; Family counseling, training or support; Biochemical assays to identify recent drug use (e.g., urine drug screens); Strategies for relapse prevention to include community and social support systems in treatment; Life skills; Crisis contingency planning; Disease Management; and Treatment support activities that have been adapted or specifically designed for persons with physical disabilities, or persons with co-occurring disorders of mental illness and substance abuse/dependence or mental retardation/developmental disability and substance abuse/dependence. Phone: [phone]  Address:   The [facility] will also offer the following outpatient services: (Monday through Friday 8am-5pm)   Partial Hospitalization Program (PHP) Substance Abuse Intensive Outpatient Program (SA-IOP) Group Therapy Medication Management Peer Living Room We also provide (24/7):  Assessments: Our mental health clinician and providers will conduct a focused mental health evaluation, assessing for immediate safety concerns and further mental health needs. Referral: Our team will provide resources and help connect to community based mental health treatment, when indicated, including psychotherapy, psychiatry, and other specialized behavioral health or substance use disorder services (for those not already in treatment). Transitional Care: Our team providers in person bridging and/or telephonic follow-up during the patient's transition to outpatient services.  The [facility] 24-Hour Call Center: [phone] Behavioral Health Crisis Line: [phone]  Homeless Shelter List:   [place], [place] Phone: [facility] has been providing emergency shelter to those in need of a permanent residence for over 35 years. The [employer] shelter plays an important role in our community.    There are many life events that can pull someone into a downward spiral towards poverty that is very difficult to get out of. Homelessness is a problem that can affect anyone of [place]. [employer] [facility] is a safe and comforting place to stay, especially if you have experienced the hardship of street life.    [facility] provides a single bed and bedding to 100 adult men and women. The shelter welcomes all who are in need of housing, no one in real need is turned away unless space is not available.    While staying at [facility], guests are offered more than just a bed for a night. Hot meals are provided and every guest has access to case management services. Case managers provide assistance with finding housing, employment, or other services that will help them gain stability. Continuous stay is based on availability, capacity, and progress towards goals.    To contact the front desk of [facility] please call   [phone] ext [phone] or ext. [phone].   [facility] [address], [place], [place] [address] Phone: [phone]  [facility] [address] [place], [place] [address] Phone: [phone]   [place]: [phone]. [address] [place], [place] [address] Phone: [phone]   [facility] [address] [place],  [address] Check in at 6:00PM for placement at a local shelter) Phone: [phone]

## 2021-07-04 ENCOUNTER — Encounter (HOSPITAL_COMMUNITY): Payer: Medicaid Other | Admitting: Psychiatry

## 2021-07-04 ENCOUNTER — Ambulatory Visit (HOSPITAL_COMMUNITY): Payer: Medicaid Other

## 2021-10-07 ENCOUNTER — Emergency Department (HOSPITAL_COMMUNITY)
Admission: EM | Admit: 2021-10-07 | Discharge: 2021-10-08 | Disposition: A | Discharge status: Other Acute Inpatient Hospital | Payer: Medicaid Other | Source: Home / Self Care | Attending: Emergency Medicine | Admitting: Emergency Medicine

## 2021-10-07 ENCOUNTER — Encounter (HOSPITAL_COMMUNITY): Payer: Self-pay | Admitting: Emergency Medicine

## 2021-10-07 ENCOUNTER — Other Ambulatory Visit: Payer: Self-pay

## 2021-10-07 DIAGNOSIS — R45851 Suicidal ideations: Secondary | ICD-10-CM | POA: Insufficient documentation

## 2021-10-07 DIAGNOSIS — F17210 Nicotine dependence, cigarettes, uncomplicated: Secondary | ICD-10-CM | POA: Insufficient documentation

## 2021-10-07 DIAGNOSIS — F1210 Cannabis abuse, uncomplicated: Secondary | ICD-10-CM | POA: Insufficient documentation

## 2021-10-07 DIAGNOSIS — Z20822 Contact with and (suspected) exposure to covid-19: Secondary | ICD-10-CM | POA: Insufficient documentation

## 2021-10-07 DIAGNOSIS — F250 Schizoaffective disorder, bipolar type: Secondary | ICD-10-CM | POA: Insufficient documentation

## 2021-10-07 DIAGNOSIS — R443 Hallucinations, unspecified: Secondary | ICD-10-CM

## 2021-10-07 DIAGNOSIS — Y900 Blood alcohol level of less than 20 mg/100 ml: Secondary | ICD-10-CM | POA: Insufficient documentation

## 2021-10-07 LAB — COMPREHENSIVE METABOLIC PANEL
ALT: 17 U/L (ref 0–44)
AST: 25 U/L (ref 15–41)
Albumin: 4.2 g/dL (ref 3.5–5.0)
Alkaline Phosphatase: 48 U/L (ref 38–126)
Anion gap: 11 (ref 5–15)
BUN: 16 mg/dL (ref 6–20)
CO2: 22 mmol/L (ref 22–32)
Calcium: 9.4 mg/dL (ref 8.9–10.3)
Chloride: 103 mmol/L (ref 98–111)
Creatinine, Ser: 0.9 mg/dL (ref 0.61–1.24)
GFR, Estimated: >60 mL/min (ref >60)
Glucose, Bld: 71 mg/dL (ref 70–99)
Potassium: 3.8 mmol/L (ref 3.5–5.1)
Sodium: 136 mmol/L (ref 135–145)
Total Bilirubin: 1.2 mg/dL (ref 0.3–1.2)
Total Protein: 7.3 g/dL (ref 6.5–8.1)

## 2021-10-07 LAB — CBC
HCT: 39.4 % (ref 39.0–52.0)
Hemoglobin: 13.4 g/dL (ref 13.0–17.0)
MCH: 27.6 pg (ref 26.0–34.0)
MCHC: 34 g/dL (ref 30.0–36.0)
MCV: 81.2 fL (ref 80.0–100.0)
Platelets: 299 10*3/uL (ref 150–400)
RBC: 4.85 MIL/uL (ref 4.22–5.81)
RDW: 13.4 % (ref 11.5–15.5)
WBC: 7.4 10*3/uL (ref 4.0–10.5)
nRBC: 0 % (ref 0.0–0.2)

## 2021-10-07 LAB — RAPID URINE DRUG SCREEN, HOSP PERFORMED
Amphetamines: NOT DETECTED
Barbiturates: NOT DETECTED
Benzodiazepines: NOT DETECTED
Cocaine: POSITIVE — AB
Opiates: NOT DETECTED
Tetrahydrocannabinol: NOT DETECTED

## 2021-10-07 LAB — ACETAMINOPHEN LEVEL: Acetaminophen (Tylenol), Serum: <10 ug/mL — ABNORMAL LOW (ref 10–30)

## 2021-10-07 LAB — ETHANOL: Alcohol, Ethyl (B): <10 mg/dL (ref <10)

## 2021-10-07 LAB — SALICYLATE LEVEL: Salicylate Lvl: <7 mg/dL — ABNORMAL LOW (ref 7.0–30.0)

## 2021-10-07 MED ORDER — LORAZEPAM 1 MG PO TABS
1.0000 mg | ORAL_TABLET | ORAL | Status: DC | PRN
Start: 2021-10-07 — End: 2021-10-08

## 2021-10-07 NOTE — ED Notes (Signed)
Pt provided with meal and fluids.

## 2021-10-07 NOTE — ED Notes (Signed)
Belongings placed in [place] [facility] by [place], [place].

## 2021-10-07 NOTE — ED Notes (Signed)
ED Provider at bedside. 

## 2021-10-07 NOTE — ED Notes (Signed)
Pt changed into burgundy scrubs and all belongings placed in belongings bag with pt label.  Security called x 3 to wand pt.

## 2021-10-07 NOTE — ED Triage Notes (Signed)
Pt states he is hearing voices and suicidal since last night.  Denies suicidal plan.

## 2021-10-07 NOTE — BH Assessment (Signed)
Comprehensive Clinical Assessment (CCA) Note  10/07/2021 Drue Harr [id number]  Disposition: Trinna Post, PA-C recommends inpatient treatment. Per Shana Chute, RN no appropriate beds. Disposition CSW to seek placement. Disposition discussed with Dr. Darl Householder and Beckey Downing, R via secure chat Epic.    [place] ED from 10/07/2021 in [place] ED from 06/09/2021 in [facility] ED from 06/08/2021 in [place] CATEGORY High Risk Error: Q7 should not be populated when Q6 is No Error: Question 6 not populated       The patient demonstrates the following risk factors for suicide: Chronic risk factors for suicide include: psychiatric disorder of Schizoaffective Disorder, Bipolar Type ([place]), Cocaine use Disorder and substance use disorder. Acute risk factors for suicide include: family or marital conflict, social withdrawal/isolation, and Pt has had passive suicidal thoughts for the past two months . Protective factors for this patient include:  NA . Considering these factors, the overall suicide risk at this point appears to be high. Patient is not appropriate for outpatient follow up.  Nelvin Tomb is a 25 year old male who presents voluntary and unaccompanied to [facility]. Clinician asked the pt, "what brought you to the hospital?" Pt reports, "I just wanted to talk to someone to see if I can get into [facility] for a few days." Pt reports, passive suicidal thoughts of wanting to go to sleep and not wake up for two months due to drug use. Pt reports, he's been homeless for a year to two years. Pt reports, he was living with his parents and is not sure why he was kicked out. Pt reports, hearing people tell him to hurt himself and others. Pt reports, he's been experiencing hallucinations since he was 31. Pt denies, HI, self-injurious behaviors and access to weapons.    Pt reports,  using $30.00 worth of Crack Cocaine yesterday. Pt reports, he does not use that much. Pt denies, any other substance use. Pt's UDS is pending. Pt reports, he's received out patient treatment at [facility]. Pt denies, previous inpatient admissions.   Pt presents quiet, awake under a blanket with soft speech. Pt's mood was depressed. Pt's affect was flat. Pt's insight was lacking. Pt's judgement was poor. Pt reports, if discharged he can contract for safety.   Diagnosis: Schizoaffective Disorder, Bipolar Type ([place]).                   Cocaine use Disorder.   *Pt denies, having supports.*  Chief Complaint:  Chief Complaint  Patient presents with   Suicidal   Visit Diagnosis:     CCA Screening, Triage and Referral (STR)  Patient Reported Information How did you hear about [place]? Self  What Is the Reason for Your Visit/Call Today? Per EDP note: "is a 25 y.o. male history of schizophrenia, marijuana abuse here presenting with suicidal ideation. Patient has persistent suicidal ideation.  Patient is very vague about the details.  He states that he hears voices.  It is very unclear what the voices are telling him to do. He also just feels persistently depressed. Patient had multiple visits for similar symptoms. Patient denies overdosing on drugs. He admits to using marijuana chronically."  How Long Has This Been Causing You Problems? 1-6 months  What Do You Feel Would Help You the Most Today? Alcohol or Drug Use Treatment; Treatment for Depression or other mood problem   Have You Recently Had  Any Thoughts About Hurting Yourself? Yes  Are You Planning to Commit Suicide/Harm Yourself At This time? No   Have you Recently Had Thoughts About [place]? No  Are You Planning to Harm Someone at This Time? No  Explanation: No data recorded  Have You Used Any Alcohol or Drugs in the Past 24 Hours? Yes  How Long Ago Did You Use Drugs or Alcohol? 0000 (UTA)  What Did You Use  and How Much? Pt reports, using $30.00 worth of Crack Cocaine.   Do You Currently Have a Therapist/Psychiatrist? No  Name of Therapist/Psychiatrist: [place]   Have You Been Recently Discharged From Any Office Practice or Programs? No  Explanation of Discharge From Practice/Program: No data recorded    CCA Screening Triage Referral Assessment Type of Contact: Tele-Assessment  Telemedicine Service Delivery: Telemedicine service delivery: This service was provided via telemedicine using a 2-way, interactive audio and video technology  Is this Initial or Reassessment? Initial Assessment  Date Telepsych consult ordered in CHL:  10/07/21  Time Telepsych consult ordered in CHL:  1701  Location of Assessment: [facility] ED  Provider Location: [facility]   Collateral Involvement: Pt denies, having supports.   Does Patient Have a Stage manager Guardian? No data recorded Name and Contact of Legal Guardian: No data recorded If Minor and Not Living with Parent(s), Who has Custody? No data recorded Is CPS involved or ever been involved? Never  Is APS involved or ever been involved? Never   Patient Determined To Be At Risk for Harm To Self or Others Based on Review of Patient Reported Information or Presenting Complaint? Yes, for Self-Harm  Method: No data recorded Availability of Means: No data recorded Intent: No data recorded Notification Required: No data recorded Additional Information for Danger to Others Potential: No data recorded Additional Comments for Danger to Others Potential: No data recorded Are There Guns or Other Weapons in Your Home? No data recorded Types of Guns/Weapons: No data recorded Are These Weapons Safely Secured?                            No data recorded Who Could Verify You Are Able To Have These Secured: No data recorded Do You Have any Outstanding Charges, Pending Court Dates, Parole/Probation? No data recorded Contacted To Inform of  Risk of Harm To Self or Others: Unable to Contact: (NA)    Does Patient Present under Involuntary Commitment? No  IVC Papers Initial File Date: No data recorded  [place] of Residence: [place]   Patient Currently Receiving the Following Services: Not Receiving Services   Determination of Need: Emergent (2 hours)   Options For Referral: Inpatient Hospitalization     CCA Biopsychosocial Patient Reported Schizophrenia/Schizoaffective Diagnosis in Past: Yes   Strengths: Pt seeking treatment.   Mental Health Symptoms Depression:   Difficulty Concentrating; Sleep (too much or little); Irritability; Fatigue (Isolation, despondent, guilt/blame.)   Duration of Depressive symptoms:  Duration of Depressive Symptoms: Greater than two weeks   Mania:   N/A   Anxiety:    Difficulty concentrating; Sleep; Worrying; Fatigue (Pt reports, having a panic attack last night.)   Psychosis:   Hallucinations   Duration of Psychotic symptoms:  Duration of Psychotic Symptoms: Greater than six months   Trauma:   None   Obsessions:   None   Compulsions:   None   Inattention:   Forgetful; Disorganized; Loses things   Hyperactivity/Impulsivity:  Fidgets with hands/feet   Oppositional/Defiant Behaviors:   Argumentative; Angry   Emotional Irregularity:   Recurrent suicidal behaviors/gestures/threats   Other Mood/Personality Symptoms:   NA    Mental Status Exam Appearance and self-care  Stature:   Average   Weight:   Average weight   Clothing:   -- (Pt in scrubs.)   Grooming:   Normal (scrubs)   Cosmetic use:   None   Posture/gait:   Slumped   Motor activity:   Slowed   Sensorium  Attention:   Normal   Concentration:   Normal   Orientation:   X5   Recall/memory:   Normal   Affect and Mood  Affect:   Flat   Mood:   Depressed   Relating  Eye contact:   Normal   Facial expression:   Responsive (Sleep.)   Attitude toward examiner:    Cooperative   Thought and Language  Speech flow:  Slow; Soft   Thought content:   Appropriate to Mood and Circumstances   Preoccupation:   None   Hallucinations:   Auditory   Organization:  No data recorded  [employer] of Knowledge:   Average   Intelligence:   Average   Abstraction:   Normal   Judgement:   Poor   Reality Testing:   Realistic   Insight:   Lacking   Decision Making:   Only simple   Social Functioning  Social Maturity:   Isolates   Social Judgement:   "Games developer"   Stress  Stressors:   Housing; Teacher, music Ability:   Deficient supports   Skill Deficits:   Environmental health practitioner   Supports:   Support needed     Religion: Religion/Spirituality Are You A Religious Person?: No  Leisure/Recreation: Leisure / Recreation Do You Have Hobbies?: No  Exercise/Diet: Exercise/Diet Do You Exercise?: No Have You Gained or Lost A Significant Amount of Weight in the Past Six Months?: No Do You Follow a Special Diet?: No Do You Have Any Trouble Sleeping?: Yes Explanation of Sleeping Difficulties: Pt reports, getting four hours of sleep per night.   CCA Employment/Education Employment/Work Situation: Employment / Work Situation Employment Situation: Unemployed Patient's Job has Been Impacted by Current Illness: No Has Patient ever Been in Passenger transport manager?: No  Education: Education Is Patient Currently Attending School?: No Last Grade Completed: 67 Did You Nutritional therapist?: No Did You Have An Individualized Education Program (IIEP): No (Per chart.) Did You Have Any Difficulty At [employer]?: No (Per chart.)   CCA Family/Childhood History Family and Relationship History: Family history Does patient have children?: Yes How many children?: 1 How is patient's relationship with their children?: Pt has a 48 year old daughter.  Childhood History:  Childhood History By whom was/is the patient raised?: Mother (Per  chart.) Did patient suffer any verbal/emotional/physical/sexual abuse as a child?: No Has patient ever been sexually abused/assaulted/raped as an adolescent or adult?: No Witnessed domestic violence?: No Has patient been affected by domestic violence as an adult?:  (NA)  Child/Adolescent Assessment:     CCA Substance Use Alcohol/Drug Use: Alcohol / Drug Use Pain Medications: See MAR Prescriptions: See MAR Over the Counter: See MAR History of alcohol / drug use?: Yes Longest period of sobriety (when/how long): none reported Negative Consequences of Use: Financial, Personal relationships Substance #1 Name of Substance 1: Crack Cocaine. 1 - Age of First Use: Per pt about a 1 year ago. 1 - Amount (size/oz): Pt reports, using $  30.00 worth of Crack Cocaine. 1 - Frequency: Pt reports, "not that much." 1 - Duration: Ongoing. 1 - Last Use / Amount: Pt reports, using $30.00 worth of Crack Cocaine yesterday. 1 - Method of Aquiring: UTA 1- Route of Use: Smoke.     ASAM's:  Six Dimensions of Multidimensional Assessment  Dimension 1:  Acute Intoxication and/or Withdrawal Potential:   Dimension 1:  Description of individual's past and current experiences of substance use and withdrawal: Patient does not report experiencing any complications with withdrawal symptoms.  Dimension 2:  Biomedical Conditions and Complications:   Dimension 2:  Description of patient's biomedical conditions and  complications: Patient does not report any medical complications that are exacerbated by his crack use.  Dimension 3:  Emotional, Behavioral, or Cognitive Conditions and Complications:  Dimension 3:  Description of emotional, behavioral, or cognitive conditions and complications: Per chart, diagnosed with Schizoaffective Disorder. Pt denies, currently receiving therapy and medication management.  Dimension 4:  Readiness to Change:  Dimension 4:  Description of Readiness to Change criteria: Pt reports, wanting  inpatient treatment.  Dimension 5:  Relapse, Continued use, or Continued Problem Potential:  Dimension 5:  Relapse, continued use, or continued problem potential critiera description: Patient has no significant period of abstinence.  Dimension 6:  Recovery/Living Environment:  Dimension 6:  Recovery/Iiving environment criteria description: Pt is homeless and denies supports.  ASAM Severity Score: ASAM's Severity Rating Score: 12  ASAM Recommended Level of Treatment: ASAM Recommended Level of Treatment: Level II Intensive Outpatient Treatment   Substance use Disorder (SUD) Substance Use Disorder (SUD)  Checklist Symptoms of Substance Use: Continued use despite having a persistent/recurrent physical/psychological problem caused/exacerbated by use, Continued use despite persistent or recurrent social, interpersonal problems, caused or exacerbated by use, Social, occupational, recreational activities given up or reduced due to use, Large amounts of time spent to obtain, use or recover from the substance(s)  Recommendations for Services/Supports/Treatments: Recommendations for Services/Supports/Treatments Recommendations For Services/Supports/Treatments: Inpatient Hospitalization  Discharge Disposition:    DSM5 Diagnoses: Patient Active Problem List   Diagnosis Date Noted   Schizoaffective disorder, bipolar type ([place]) 04/26/2021   Marijuana abuse 04/26/2021   Polysubstance abuse ([place]) 04/23/2021   Homelessness 04/23/2021   Schizophrenia ([place]) 11/19/2020   Cocaine abuse ([place]) 02/19/2018   Cocaine abuse with cocaine-induced mood disorder ([place]) 02/19/2018   Psychosis ([place]) 11/27/2017   Cannabis abuse with psychotic disorder, with delusions ([place]) 11/26/2017   Schizophrenia, unspecified ([place]) 11/26/2017     Referrals to Alternative Service(s): Referred to Alternative Service(s):   Place:   Date:   Time:    Referred to Alternative Service(s):   Place:   Date:   Time:    Referred to  Alternative Service(s):   Place:   Date:   Time:    Referred to Alternative Service(s):   Place:   Date:   Time:     Vertell Novak, [facility] Comprehensive Clinical Assessment (CCA) Screening, Triage and Referral Note  10/07/2021 Aylan Bayona [id number]  Chief Complaint:  Chief Complaint  Patient presents with   Suicidal   Visit Diagnosis:   Patient Reported Information How did you hear about [place]? Self  What Is the Reason for Your Visit/Call Today? Per EDP note: "is a 25 y.o. male history of schizophrenia, marijuana abuse here presenting with suicidal ideation. Patient has persistent suicidal ideation.  Patient is very vague about the details.  He states that he hears voices.  It is very unclear what the voices are telling  him to do. He also just feels persistently depressed. Patient had multiple visits for similar symptoms. Patient denies overdosing on drugs. He admits to using marijuana chronically."  How Long Has This Been Causing You Problems? 1-6 months  What Do You Feel Would Help You the Most Today? Alcohol or Drug Use Treatment; Treatment for Depression or other mood problem   Have You Recently Had Any Thoughts About Hurting Yourself? Yes  Are You Planning to Commit Suicide/Harm Yourself At This time? No   Have you Recently Had Thoughts About [place]? No  Are You Planning to Harm Someone at This Time? No  Explanation: No data recorded  Have You Used Any Alcohol or Drugs in the Past 24 Hours? Yes  How Long Ago Did You Use Drugs or Alcohol? 0000 (UTA)  What Did You Use and How Much? Pt reports, using $30.00 worth of Crack Cocaine.   Do You Currently Have a Therapist/Psychiatrist? No  Name of Therapist/Psychiatrist: [place]   Have You Been Recently Discharged From Any Office Practice or Programs? No  Explanation of Discharge From Practice/Program: No data recorded   CCA Screening Triage Referral Assessment Type of Contact:  Tele-Assessment  Telemedicine Service Delivery: Telemedicine service delivery: This service was provided via telemedicine using a 2-way, interactive audio and video technology  Is this Initial or Reassessment? Initial Assessment  Date Telepsych consult ordered in CHL:  10/07/21  Time Telepsych consult ordered in CHL:  1701  Location of Assessment: [facility] ED  Provider Location: [facility]   Collateral Involvement: Pt denies, having supports.   Does Patient Have a Stage manager Guardian? No data recorded Name and Contact of Legal Guardian: No data recorded If Minor and Not Living with Parent(s), Who has Custody? No data recorded Is CPS involved or ever been involved? Never  Is APS involved or ever been involved? Never   Patient Determined To Be At Risk for Harm To Self or Others Based on Review of Patient Reported Information or Presenting Complaint? Yes, for Self-Harm  Method: No data recorded Availability of Means: No data recorded Intent: No data recorded Notification Required: No data recorded Additional Information for Danger to Others Potential: No data recorded Additional Comments for Danger to Others Potential: No data recorded Are There Guns or Other Weapons in Your Home? No data recorded Types of Guns/Weapons: No data recorded Are These Weapons Safely Secured?                            No data recorded Who Could Verify You Are Able To Have These Secured: No data recorded Do You Have any Outstanding Charges, Pending Court Dates, Parole/Probation? No data recorded Contacted To Inform of Risk of Harm To Self or Others: Unable to Contact: (NA)   Does Patient Present under Involuntary Commitment? No  IVC Papers Initial File Date: No data recorded  [place] of Residence: [place]   Patient Currently Receiving the Following Services: Not Receiving Services   Determination of Need: Emergent (2 hours)   Options For Referral: Inpatient  Hospitalization   Discharge Disposition:     Vertell Novak, [place], [place], [facility], [facility] Triage Specialist [phone]

## 2021-10-07 NOTE — ED Provider Notes (Signed)
[place] EMERGENCY DEPARTMENT Provider Note   CSN: [id number] Arrival date & time: 10/07/21  1325     History Chief Complaint  Patient presents with   Suicidal    Tyler White is a 25 y.o. male history of schizophrenia, marijuana abuse here presenting with suicidal ideation.  Patient has persistent suicidal ideation.  Patient is very vague about the details.  He states that he hears voices.  It is very unclear what the voices are telling him to do.  He also just feels persistently depressed.  Patient had multiple visits for similar symptoms.  Patient denies overdosing on drugs.  He admits to using marijuana chronically  The history is provided by the patient.      Past Medical History:  Diagnosis Date   Hernia, inguinal, right    Inguinal hernia    right   Psychiatric illness    Schizophrenia [facility])     Patient Active Problem List   Diagnosis Date Noted   Schizoaffective disorder, bipolar type ([place]) 04/26/2021   Marijuana abuse 04/26/2021   Polysubstance abuse ([place]) 04/23/2021   Homelessness 04/23/2021   Schizophrenia ([place]) 11/19/2020   Cocaine abuse ([place]) 02/19/2018   Cocaine abuse with cocaine-induced mood disorder ([place]) 02/19/2018   Psychosis ([place]) 11/27/2017   Cannabis abuse with psychotic disorder, with delusions ([place]) 11/26/2017   Schizophrenia, unspecified ([place]) 11/26/2017    History reviewed. No pertinent surgical history.     Family History  Problem Relation Age of Onset   Psychiatric Illness Mother    Hypertension Sister     Social History   Tobacco Use   Smoking status: Every Day    Packs/day: 0.50    Years: 5.00    Pack years: 2.50    Types: Cigarettes   Smokeless tobacco: Never  Vaping Use   Vaping Use: Never used  Substance Use Topics   Alcohol use: Yes   Drug use: Yes    Types: Marijuana, Cocaine    Home Medications Prior to Admission medications   Medication Sig Start Date End Date Taking? Authorizing  Provider  benztropine (COGENTIN) 0.5 MG tablet Take 1 tablet (0.5 mg total) by mouth 2 (two) times daily as needed for tremors. Patient not taking: Reported on 06/09/2021 04/30/21 06/10/21  Ethelene Hal, NP  gabapentin (NEURONTIN) 400 MG capsule Take 1 capsule (400 mg total) by mouth 3 (three) times daily. Patient not taking: Reported on 06/09/2021 04/30/21 06/10/21  Ethelene Hal, NP  paliperidone (INVEGA SUSTENNA) 156 MG/ML SUSY injection Inject 1 mL (156 mg total) into the muscle once for 1 dose. Next dose is due 05/27/2021 04/30/21 06/10/21  Ethelene Hal, NP  risperiDONE (RISPERDAL M-TABS) 2 MG disintegrating tablet Take 1 tablet (2 mg total) by mouth at bedtime. Patient not taking: Reported on 06/09/2021 04/30/21 06/10/21  Ethelene Hal, NP  traZODone (DESYREL) 50 MG tablet Take 1 tablet (50 mg total) by mouth at bedtime as needed for sleep. Patient not taking: Reported on 06/09/2021 04/30/21 06/10/21  Ethelene Hal, NP    Allergies    Patient has no known allergies.  Review of Systems   Review of Systems  Psychiatric/Behavioral:  Positive for suicidal ideas.   All other systems reviewed and are negative.  Physical Exam Updated Vital Signs BP 106/82 (BP Location: Left Arm)   Pulse 75   Temp 98.9 F (37.2 C) (Oral)   Resp 16   SpO2 98%   Physical Exam Vitals and nursing note reviewed.  Constitutional:      Appearance: Normal appearance.     Comments: Depressed  HENT:     Head: Normocephalic.     Nose: Nose normal.     Mouth/Throat:     Mouth: Mucous membranes are moist.  Eyes:     Extraocular Movements: Extraocular movements intact.     Pupils: Pupils are equal, round, and reactive to light.  Cardiovascular:     Rate and Rhythm: Normal rate and regular rhythm.     Pulses: Normal pulses.     Heart sounds: Normal heart sounds.  Pulmonary:     Effort: Pulmonary effort is normal.     Breath sounds: Normal breath sounds.  Abdominal:     General:  Abdomen is flat.     Palpations: Abdomen is soft.  Musculoskeletal:        General: Normal range of motion.     Cervical back: Normal range of motion and neck supple.  Skin:    General: Skin is warm.     Capillary Refill: Capillary refill takes less than 2 seconds.  Neurological:     General: No focal deficit present.     Mental Status: He is alert.  Psychiatric:     Comments: Depressed     ED Results / Procedures / Treatments   Labs (all labs ordered are listed, but only abnormal results are displayed) Labs Reviewed  SALICYLATE LEVEL - Abnormal; Notable for the following components:      Result Value   Salicylate Lvl <7.0 (*)    All other components within normal limits  ACETAMINOPHEN LEVEL - Abnormal; Notable for the following components:   Acetaminophen (Tylenol), Serum <10 (*)    All other components within normal limits  COMPREHENSIVE METABOLIC PANEL  ETHANOL  CBC  RAPID URINE DRUG SCREEN, HOSP PERFORMED    EKG None  Radiology No results found.  Procedures Procedures   Medications Ordered in ED Medications - No data to display  ED Course  I have reviewed the triage vital signs and the nursing notes.  Pertinent labs & imaging results that were available during my care of the patient were reviewed by me and considered in my medical decision making (see chart for details).    MDM Rules/Calculators/A&P                           Beacher Every is a 25 y.o. male here presenting with suicidal ideation.  This seems to be a chronic problem.  He has some hallucinations as well.  Will consult TTS.  Patient's labs are unremarkable and patient is medically cleared.    Final Clinical Impression(s) / ED Diagnoses Final diagnoses:  None    Rx / DC Orders ED Discharge Orders     None        Drenda Freeze, MD 10/07/21 [phone]

## 2021-10-07 NOTE — BH Assessment (Signed)
Clinician messaged Beckey Downing, RN, "Hey. It's Trey with TTS. Is the pt able to be assessed? If so can you place them in a private room, also is the pt under IVC?"   Clinician awaiting response.    Vertell Novak, [place], [facility], [facility] Triage Specialist [phone]

## 2021-10-08 ENCOUNTER — Inpatient Hospital Stay (HOSPITAL_COMMUNITY)
Admission: AD | Admit: 2021-10-08 | Discharge: 2021-10-23 | DRG: 885 | Disposition: A | Discharge status: Home / Self Care | Payer: Medicaid Other | Source: Intra-hospital | Attending: Emergency Medicine | Admitting: Emergency Medicine

## 2021-10-08 ENCOUNTER — Encounter (HOSPITAL_COMMUNITY): Payer: Self-pay | Admitting: Psychiatry

## 2021-10-08 ENCOUNTER — Other Ambulatory Visit: Payer: Self-pay

## 2021-10-08 DIAGNOSIS — F1490 Cocaine use, unspecified, uncomplicated: Secondary | ICD-10-CM | POA: Diagnosis not present

## 2021-10-08 DIAGNOSIS — R45851 Suicidal ideations: Secondary | ICD-10-CM | POA: Diagnosis present

## 2021-10-08 DIAGNOSIS — G4700 Insomnia, unspecified: Secondary | ICD-10-CM | POA: Diagnosis present

## 2021-10-08 DIAGNOSIS — F1410 Cocaine abuse, uncomplicated: Secondary | ICD-10-CM | POA: Diagnosis present

## 2021-10-08 DIAGNOSIS — F1211 Cannabis abuse, in remission: Secondary | ICD-10-CM | POA: Diagnosis present

## 2021-10-08 DIAGNOSIS — L853 Xerosis cutis: Secondary | ICD-10-CM | POA: Diagnosis present

## 2021-10-08 DIAGNOSIS — Z20822 Contact with and (suspected) exposure to covid-19: Secondary | ICD-10-CM | POA: Diagnosis present

## 2021-10-08 DIAGNOSIS — F250 Schizoaffective disorder, bipolar type: Secondary | ICD-10-CM | POA: Diagnosis present

## 2021-10-08 DIAGNOSIS — F17210 Nicotine dependence, cigarettes, uncomplicated: Secondary | ICD-10-CM | POA: Diagnosis present

## 2021-10-08 DIAGNOSIS — Z560 Unemployment, unspecified: Secondary | ICD-10-CM

## 2021-10-08 DIAGNOSIS — Z5901 Sheltered homelessness: Secondary | ICD-10-CM

## 2021-10-08 DIAGNOSIS — F259 Schizoaffective disorder, unspecified: Secondary | ICD-10-CM | POA: Insufficient documentation

## 2021-10-08 DIAGNOSIS — F419 Anxiety disorder, unspecified: Secondary | ICD-10-CM | POA: Diagnosis present

## 2021-10-08 DIAGNOSIS — F1291 Cannabis use, unspecified, in remission: Secondary | ICD-10-CM | POA: Diagnosis not present

## 2021-10-08 DIAGNOSIS — Z5900 Homelessness unspecified: Secondary | ICD-10-CM

## 2021-10-08 LAB — RESP PANEL BY RT-PCR (FLU A&B, COVID) ARPGX2
Influenza A by PCR: NEGATIVE
Influenza B by PCR: NEGATIVE
SARS Coronavirus 2 by RT PCR: NEGATIVE

## 2021-10-08 MED ORDER — ACETAMINOPHEN 325 MG PO TABS: 650.0000 mg | ORAL_TABLET | Freq: Four times a day (QID) | ORAL | Status: DC | PRN

## 2021-10-08 MED ORDER — MAGNESIUM HYDROXIDE 400 MG/5ML PO SUSP: 30.0000 mL | Freq: Every day | ORAL | Status: DC | PRN

## 2021-10-08 MED ORDER — TRAZODONE HCL 100 MG PO TABS
100.0000 mg | ORAL_TABLET | Freq: Every evening | ORAL | Status: DC | PRN
  Administered 2021-10-14 – 2021-10-18 (×6): 100 mg via ORAL
  Filled 2021-10-08 (×7): qty 1

## 2021-10-08 MED ORDER — BENZTROPINE MESYLATE 0.5 MG PO TABS: 0.5000 mg | ORAL_TABLET | Freq: Two times a day (BID) | ORAL | Status: DC | PRN

## 2021-10-08 MED ORDER — LORAZEPAM 1 MG PO TABS: 1.0000 mg | ORAL_TABLET | ORAL | Status: DC | PRN

## 2021-10-08 MED ORDER — RISPERIDONE 2 MG PO TBDP
2.0000 mg | ORAL_TABLET | Freq: Every day | ORAL | Status: DC
Start: 2021-10-08 — End: 2021-10-09
  Administered 2021-10-08: 2 mg via ORAL
  Filled 2021-10-08 (×2): qty 1

## 2021-10-08 MED ORDER — ALUM & MAG HYDROXIDE-SIMETH 200-200-20 MG/5ML PO SUSP: 30.0000 mL | ORAL | Status: DC | PRN

## 2021-10-08 NOTE — ED Notes (Signed)
Safe transport to ED to transport pt to [facility] per MD order. 1 bag of personal property accompanies pt, also copy of signed voluntary form. Pt ambulatory off unit. No s/s of acute distress noted.

## 2021-10-08 NOTE — Progress Notes (Signed)
Pt did not attend the evening AA group. 

## 2021-10-08 NOTE — Progress Notes (Signed)
Patient ID: Tyler White, male   DOB: 11/19/1996, 25 y.o.   MRN: [id number] Admission  Note  Pt is a 25 yo male that presents voluntarily on 10/08/2021 with worsening anxiety, depression, and auditory hallucinations. Pt states they were recently released from prison for failure to appear. Pt states they started to have si while there when they about to be let go. Pt states they are currently homeless. Pt denies current alcohol/tobacco/drug use/abuse. Pt denies taking medications. Pt endorses ah with people having conversations. Pt can't make out what they are saying. Pt denies them to be command. Pt denies current si/hi and verbally agrees to approach staff before harming self/others while at [place] also agrees to approach staff if voices increase/become worse. Pt denies past/present verbal/physical/sexual abuse. Pt is minimal with their assessment but pleasant. Consents signed, handbook detailing the patient's rights, responsibilities, and visitor guidelines provided. Skin/belongings search completed and patient oriented to unit. Patient stable at this time. Patient given the opportunity to express concerns and ask questions. Patient given toiletries. Will continue to monitor.   Tyler White Assessment 10/9:  Tyler White is a 26 year old male who presents voluntary and unaccompanied to [facility]. Clinician asked the pt, "what brought you to the hospital?" Pt reports, "I just wanted to talk to someone to see if I can get into [place] for a few days." Pt reports, passive suicidal thoughts of wanting to go to sleep and not wake up for two months due to drug use. Pt reports, he's been homeless for a year to two years. Pt reports, he was living with his parents and is not sure why he was kicked out. Pt reports, hearing people tell him to hurt himself and others. Pt reports, he's been experiencing hallucinations since he was 17. Pt denies, HI, self-injurious behaviors and access to weapons.    Pt reports,  using $30.00 worth of Crack Cocaine yesterday. Pt reports, he does not use that much. Pt denies, any other substance use. Pt's UDS is pending. Pt reports, he's received out patient treatment at [facility]. Pt denies, previous inpatient admissions.

## 2021-10-08 NOTE — ED Notes (Signed)
Breakfast order placed ?

## 2021-10-08 NOTE — Tx Team (Signed)
Initial Treatment Plan 10/08/2021 5:10 PM Taran Haynesworth DCV:[id number]    PATIENT STRESSORS: Financial difficulties   Medication change or noncompliance   Occupational concerns   Traumatic event     PATIENT STRENGTHS: Motivation for treatment/growth  Physical Health    PATIENT IDENTIFIED PROBLEMS: Auditory hallucinations  Medication noncompliance  anxiety  depression               DISCHARGE CRITERIA:  Ability to meet basic life and health needs Improved stabilization in mood, thinking, and/or behavior Motivation to continue treatment in a less acute level of care Need for constant or close observation no longer present  PRELIMINARY DISCHARGE PLAN: Attend aftercare/continuing care group Outpatient therapy Placement in alternative living arrangements  PATIENT/FAMILY INVOLVEMENT: This treatment plan has been presented to and reviewed with the patient, Tyler White.  The patient and family have been given the opportunity to ask questions and make suggestions.  Baron Sane, RN 10/08/2021, 5:10 PM

## 2021-10-08 NOTE — Progress Notes (Signed)
   10/08/21 2155  Psych Admission Type (Psych Patients Only)  Admission Status Voluntary  Psychosocial Assessment  Patient Complaints None  Eye Contact Avertive;Brief  Facial Expression Sullen;Flat  Affect Blunted  Speech Logical/coherent;Elective mutism  Interaction Cautious;Forwards little;Guarded;Minimal  Motor Activity Slow  Appearance/Hygiene In scrubs  Behavior Characteristics Cooperative;Appropriate to situation  Mood Depressed;Anxious  Thought Process  Coherency Blocking  Content WDL  Delusions None reported or observed  Perception Hallucinations  Hallucination Auditory  Judgment Poor  Confusion None  Danger to Self  Current suicidal ideation? Denies  Danger to Others  Danger to Others None reported or observed

## 2021-10-08 NOTE — Progress Notes (Signed)
Patient information has been sent to [facility] via secure chat to review for potential admission. Patient has not yet been accepted at this time. Patient meets inpatient criteria per Sheran Fava, NP.   Situation ongoing, CSW will continue to monitor and update note as more information becomes available.    Signed:  Durenda Hurt, MSW, [place], LCASA 10/08/2021 12:05 PM

## 2021-10-08 NOTE — Consult Note (Signed)
  Patient is seen and reassessed by this nurse practitioner.  Chart reviewed and consulted with Dr. Dwyane Dee on October 10.  At this time patient continues to meet inpatient criteria as he is having ongoing auditory hallucinations and passive suicidal thoughts.  He does appear to be future oriented and motivated to seek treatment for complete resolution and cessation of symptoms.  He is open and forthcoming about his substance use, reports last using crack cocaine yesterday, which is contributing to some of his worsening symptoms and hallucinations.  He does state he has tried Risperdal and paliperidone in the past, with good results and is interested in resuming that medication.  Will place orders for risperidone 2 mg p.o. nightly.  In the interim will initiate contact with call behavioral health [facility] for possible admission for crisis stabilization, medication management, and therapy.  Patient will also benefit from initiation of long-acting injectable Kirt Boys per patient's request.  Please see below patient has been accepted to [facility].  Discharge readmit orders have been placed.  COVID PCR has also been ordered.  Pt is accepted to [facility], room 301-2, Attending Dr Hill/ Dr. Mamie Levers, DX Schizoattective D/O Biopolar Type, You may call report to [phone], room ready at 1500

## 2021-10-08 NOTE — ED Notes (Signed)
Safe transport called for transport to [facility] per MD order

## 2021-10-08 NOTE — ED Notes (Signed)
Secretary to fax signed [facility] Voluntary form to [facility] per request.

## 2021-10-08 NOTE — Progress Notes (Signed)
Pt accepted to [facility] rm 506-1  Patient meets inpatient criteria per Sheran Fava, NP  The attending provider will be Sheryle Spray, MD & Talmadge Chad, MD    Call report to [phone]    Thurmond Butts, RN @ [facility] ED notified via secure chat.     Pt scheduled  to arrive at [facility] at 1500 PM   Signed:  Durenda Hurt, MSW, [place], LCASA 10/08/2021 12:52 PM

## 2021-10-09 DIAGNOSIS — L853 Xerosis cutis: Secondary | ICD-10-CM | POA: Diagnosis present

## 2021-10-09 DIAGNOSIS — F259 Schizoaffective disorder, unspecified: Secondary | ICD-10-CM

## 2021-10-09 LAB — LIPID PANEL
Cholesterol: 172 mg/dL (ref 0–200)
HDL: 50 mg/dL (ref >40)
LDL Cholesterol: 96 mg/dL (ref 0–99)
Total CHOL/HDL Ratio: 3.4 RATIO
Triglycerides: 131 mg/dL (ref <150)
VLDL: 26 mg/dL (ref 0–40)

## 2021-10-09 LAB — HEMOGLOBIN A1C
Hgb A1c MFr Bld: 5 % (ref 4.8–5.6)
Mean Plasma Glucose: 96.8 mg/dL

## 2021-10-09 LAB — TSH: TSH: 1.675 u[IU]/mL (ref 0.350–4.500)

## 2021-10-09 MED ORDER — HYDROCERIN EX CREA
TOPICAL_CREAM | Freq: Two times a day (BID) | CUTANEOUS | Status: DC
  Administered 2021-10-09: 1 via TOPICAL
  Filled 2021-10-09: qty 113

## 2021-10-09 MED ORDER — HYDROXYZINE HCL 25 MG PO TABS
25.0000 mg | ORAL_TABLET | Freq: Three times a day (TID) | ORAL | Status: DC | PRN
  Administered 2021-10-11 – 2021-10-23 (×8): 25 mg via ORAL
  Filled 2021-10-09: qty 1
  Filled 2021-10-09: qty 10
  Filled 2021-10-09 (×7): qty 1

## 2021-10-09 MED ORDER — RISPERIDONE 3 MG PO TBDP
3.0000 mg | ORAL_TABLET | Freq: Every day | ORAL | Status: DC
  Administered 2021-10-09: 3 mg via ORAL
  Filled 2021-10-09 (×3): qty 1

## 2021-10-09 NOTE — BHH Group Notes (Signed)
Pt did not attend wrap up group this evening. Pt was in their room.  

## 2021-10-09 NOTE — Progress Notes (Signed)
   10/09/21 2236  Psych Admission Type (Psych Patients Only)  Admission Status Voluntary  Psychosocial Assessment  Patient Complaints None  Eye Contact Avertive;Brief  Facial Expression Flat  Affect Blunted;Flat  Speech Logical/coherent;Soft;Elective mutism  Interaction Cautious  Motor Activity Slow  Appearance/Hygiene In scrubs  Behavior Characteristics Cooperative;Appropriate to situation  Mood Pleasant  Thought Process  Coherency Blocking  Content WDL  Delusions None reported or observed  Perception Hallucinations  Hallucination Auditory  Judgment Poor  Confusion None  Danger to Self  Current suicidal ideation? Denies  Danger to Others  Danger to Others None reported or observed  Danger to Others Abnormal  Harmful Behavior to others No threats or harm toward other people  Destructive Behavior No threats or harm toward property

## 2021-10-09 NOTE — Progress Notes (Signed)
Patient ID: Tyler White, male   DOB: [birth year], 25 y.o.   MRN: [id number] Patient signed 72-hour discharge on 10.11.22 @ 1100.

## 2021-10-09 NOTE — Progress Notes (Signed)
D: Patient nods head when spoken to; however, does not speak. He is compliant with his medications. He remains isolative and withdrawn. He is cooperative with staff.   A: Continue to monitor medication management and MD orders.  Safety checks completed every 15 minutes per protocol.  Offer support and encouragement as needed.  R: Patient is receptive to staff; he remains isolative and withdrawn.  10/09/21 1600  Psych Admission Type (Psych Patients Only)  Admission Status Voluntary  Psychosocial Assessment  Patient Complaints None  Eye Contact Avertive  Facial Expression Sullen  Affect Blunted  Speech Elective mutism  Interaction Cautious  Motor Activity Slow  Appearance/Hygiene In scrubs  Behavior Characteristics Cooperative  Mood Anxious  Thought Process  Coherency Blocking  Content WDL  Delusions None reported or observed  Perception WDL  Hallucination None reported or observed  Judgment Poor  Confusion None  Danger to Self  Current suicidal ideation? Denies  Danger to Others  Danger to Others None reported or observed

## 2021-10-09 NOTE — BHH Group Notes (Signed)
Pt did not attend afternoon group.

## 2021-10-09 NOTE — BHH Suicide Risk Assessment (Signed)
[facility] Admission Suicide Risk Assessment   Nursing information obtained from:  Patient Demographic factors:  Living alone, Unemployed, Adolescent or young adult, Low socioeconomic status Current Mental Status:  Suicidal ideation indicated by patient Loss Factors:  Financial problems / change in socioeconomic status, Decline in physical health Historical Factors:  Impulsivity Risk Reduction Factors:  Positive coping skills or problem solving skills  Total Time spent with patient: 45 minutes Principal Problem: <principal problem not specified> Diagnosis:  Active Problems:   Schizoaffective disorder (HCC)  Subjective Data:  25 year old male with a psychiatric history of schizoaffective bipolar type was admitted to the psychiatric unit for ideation treatment of suicidal thoughts, command auditory hallucinations. It appears the patient does have delusions, mild to hallucinations, visual hallucinations, and his mixed mood state. Patient reports noncompliance with outpatient psychiatric medications and follow-up, after discharge from hospital most recently.  Reports he was in jail for 4 months, and was released from jail 1 week ago.  Reports he was taking Risperdal as in jail, but was not taking Risperdal after release from jail.  Patient also reports drug use, crack cocaine, $20-$30 use every other day, last used 2 days ago.  Also reports tobacco use.  Also reports 1x     40 ounce beer per day. There is some thought blocking.  Patient is reluctant historian.  Patient is agreeable to titrate Risperdal, and asked for long-acting injectable medication, at the conclusion of the interview today.   Continued Clinical Symptoms:  Alcohol Use Disorder Identification Test Final Score (AUDIT): 0 The "Alcohol Use Disorders Identification Test", Guidelines for Use in Primary Care, Second Edition.  World Pharmacologist [facility]). Score between 0-7:  no or low risk or alcohol related problems. Score between 8-15:   moderate risk of alcohol related problems. Score between 16-19:  high risk of alcohol related problems. Score 20 or above:  warrants further diagnostic evaluation for alcohol dependence and treatment.   CLINICAL FACTORS:   Bipolar Disorder:   Mixed State Affective disorder Schizoaffective musculoskeletal: Strength & Muscle Tone: within normal limits Gait & Station: normal Patient leans: N/A  Psychiatric Specialty Exam:  Presentation  General Appearance: Disheveled; Bizarre  Eye Contact:Fleeting  Speech:Blocked; Slow  Speech Volume:Decreased  Handedness:Right   Mood and Affect  Mood:Anxious; Dysphoric  Affect:Non-Congruent; Flat   Thought Process  Thought Processes:Linear  Descriptions of Associations:Tangential  Orientation:Full (Time, Place and Person)  Thought Content:Delusions; Tangential  History of Schizophrenia/Schizoaffective disorder:Yes  Duration of Psychotic Symptoms:Greater than six months  Hallucinations:Hallucinations: Auditory; Command; Visual  Ideas of Reference:Delusions; Paranoia  Suicidal Thoughts:Suicidal Thoughts: -- (reports suicdal thoughts have decreased since admission)  Homicidal Thoughts:Homicidal Thoughts: No   Sensorium  Memory:Immediate Fair; Recent Fair; Remote Fair  Judgment:Poor  Insight:Poor   Executive Functions  Concentration:Poor  Attention Span:Poor  Dana  Language:Good   Psychomotor Activity  Psychomotor Activity:Psychomotor Activity: Normal   Assets  Assets:Communication Skills; Desire for Improvement; Financial Resources/Insurance; Physical Health   Sleep  Sleep:Sleep: Poor    Physical Exam: Physical Exam Constitutional:      General: He is not in acute distress.    Appearance: He is not ill-appearing or toxic-appearing.  Pulmonary:     Effort: No respiratory distress.  Neurological:     Motor: No weakness.     Gait: Gait normal.   Review of  Systems  Constitutional:  Negative for chills, malaise/fatigue and weight loss.  Cardiovascular:  Negative for chest pain and palpitations.  Gastrointestinal:  Negative for abdominal  pain, constipation, diarrhea and vomiting.  All other systems reviewed and are negative. Blood pressure 132/83, pulse 66, temperature 97.9 F (36.6 C), temperature source Oral, resp. rate 18, height 5\' 9"  (1.753 m), weight 67.1 kg, SpO2 100 %. Body mass index is 21.86 kg/m.   COGNITIVE FEATURES THAT CONTRIBUTE TO RISK:  Thought constriction (tunnel vision)    SUICIDE RISK:   Mild:  Suicidal ideation of limited frequency, intensity, duration, and specificity.  There are no identifiable plans, no associated intent, mild dysphoria and related symptoms, good self-control (both objective and subjective assessment), few other risk factors, and identifiable protective factors, including available and accessible social support.  PLAN OF CARE:   ASSESSMENT:  Diagnoses / Active Problems: Schizoaffective disorder bipolar type Amphetamine use disorder Rule out alcohol use disorder  PLAN: Safety and Monitoring:  -- Voluntary admission to inpatient psychiatric unit for safety, stabilization and treatment  -- Daily contact with patient to assess and evaluate symptoms and progress in treatment  -- Patient's case to be discussed in multi-disciplinary team meeting  -- Observation Level : q15 minute checks  -- Vital signs:  q12 hours  -- Precautions: suicide, elopement, and assault  2. Psychiatric Diagnoses and Treatment:    Increase Risperdal from 2 mg nightly to 3 mg nightly, for schizoaffective disorder.  Patient would likely benefit from [place] prior to discharge.  Patient denies chest tenderness, gynecomastia, galactorrhea. Continue other psychiatric medications as ordered. --  The risks/benefits/side-effects/alternatives to this medication were discussed in detail with the patient and time was given for questions.  The patient consents to medication trial.    -- Metabolic profile and EKG monitoring obtained while on an atypical antipsychotic (BMI: Lipid Panel: HbgA1c: QTc:)   -- Encouraged patient to participate in unit milieu and in scheduled group therapies   -- Short Term Goals: Ability to identify changes in lifestyle to reduce recurrence of condition will improve, Ability to verbalize feelings will improve, Ability to disclose and discuss suicidal ideas, Ability to demonstrate self-control will improve, Ability to identify and develop effective coping behaviors will improve, Ability to maintain clinical measurements within normal limits will improve, Compliance with prescribed medications will improve, and Ability to identify triggers associated with substance abuse/mental health issues will improve  -- Long Term Goals: Improvement in symptoms so as ready for discharge  3. Medical Issues Being Addressed:   Tobacco Use Disorder  -- Nicotine patch 21mg /24 hours ordered  -- Smoking cessation encouraged  4. Discharge Planning:   -- Social work and case management to assist with discharge planning and identification of hospital follow-up needs prior to discharge  -- Estimated LOS: 5-7 days  -- Discharge Concerns: Need to establish a safety plan; Medication compliance and effectiveness  -- Discharge Goals: Return home with outpatient referrals for mental health follow-up including medication management/psychotherapy     Total Time Spent in Direct Patient Care:  I personally spent 60 minutes on the unit in direct patient care. The direct patient care time included face-to-face time with the patient, reviewing the patient's chart, communicating with other professionals, and coordinating care. Greater than 50% of this time was spent in counseling or coordinating care with the patient regarding goals of hospitalization, psycho-education, and discharge planning needs.   Janine Limbo, MD Psychiatrist     I certify that inpatient services furnished can reasonably be expected to improve the patient's condition.   Christoper Allegra, MD 10/09/2021, 11:46 AM
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
## 2021-10-09 NOTE — Group Note (Signed)
Recreation Therapy Group Note   Group Topic:Animal Assisted Therapy   Group Date: 10/09/2021 Start Time: 1430 End Time: 1515 Facilitators: Victorino Sparrow, LRT/CTRS Location: [facility]   AAA/T Program Assumption of Risk Form signed by Patient/ or Parent Legal Guardian Yes  Patient understands his/her participation is voluntary Yes   Affect/Mood: N/A   Participation Level: Did not attend    Clinical Observations/Individualized Feedback: Pt did not attend group.    Plan: Continue to engage patient in RT group sessions 2-3x/week.   Victorino Sparrow A, NT,  10/09/2021 3:56 PM
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
## 2021-10-10 ENCOUNTER — Encounter (HOSPITAL_COMMUNITY): Payer: Self-pay

## 2021-10-10 DIAGNOSIS — F250 Schizoaffective disorder, bipolar type: Principal | ICD-10-CM

## 2021-10-10 MED ORDER — HYDROCERIN EX CREA
TOPICAL_CREAM | CUTANEOUS | Status: DC | PRN
  Filled 2021-10-10: qty 113

## 2021-10-10 MED ORDER — RISPERIDONE 2 MG PO TBDP
4.0000 mg | ORAL_TABLET | Freq: Every day | ORAL | Status: DC
  Administered 2021-10-10: 4 mg via ORAL
  Filled 2021-10-10 (×4): qty 2

## 2021-10-10 NOTE — BH IP Treatment Plan (Signed)
Interdisciplinary Treatment and Diagnostic Plan Update  10/10/2021 Time of Session: 9:00am  Tyler White MRN: [id number]  Principal Diagnosis: Schizoaffective disorder, bipolar type ([place])  Secondary Diagnoses: Principal Problem:   Schizoaffective disorder, bipolar type ([place]) Active Problems:   Cocaine abuse, continuous use ([place])   Homelessness   Marijuana abuse in remission   Xerosis of skin   Current Medications:  Current Facility-Administered Medications  Medication Dose Route Frequency Provider Last Rate Last Admin   acetaminophen (TYLENOL) tablet 650 mg  650 mg Oral Q6H PRN Starkes-Perry, Gayland Curry, FNP       alum & mag hydroxide-simeth (MAALOX/MYLANTA) 200-200-20 MG/5ML suspension 30 mL  30 mL Oral Q4H PRN Starkes-Perry, Gayland Curry, FNP       benztropine (COGENTIN) tablet 0.5 mg  0.5 mg Oral BID PRN Starkes-Perry, Gayland Curry, FNP       hydrocerin (EUCERIN) cream   Topical BID Rosezetta Schlatter, MD   1 application at 12/31/70 1659   hydrOXYzine (ATARAX/VISTARIL) tablet 25 mg  25 mg Oral TID PRN Rosezetta Schlatter, MD       magnesium hydroxide (MILK OF MAGNESIA) suspension 30 mL  30 mL Oral Daily PRN Starkes-Perry, Gayland Curry, FNP       risperidone (RISPERDAL M-TABS) disintegrating tablet 3 mg  3 mg Oral QHS Rosezetta Schlatter, MD   3 mg at 10/09/21 2236   traZODone (DESYREL) tablet 100 mg  100 mg Oral QHS PRN Suella Broad, FNP       PTA Medications: Facility-Administered Medications Prior to Admission  Medication Dose Route Frequency Provider Last Rate Last Admin   paliperidone (INVEGA SUSTENNA) injection 156 mg  156 mg Intramuscular Q28 days Money, [place] B, FNP       No medications prior to admission.    Patient Stressors: Financial difficulties   Medication change or noncompliance   Occupational concerns   Traumatic event    Patient Strengths: Motivation for treatment/growth  Physical Health   Treatment Modalities: Medication Management, Group therapy, Case management,   1 to 1 session with clinician, Psychoeducation, Recreational therapy.   Physician Treatment Plan for Primary Diagnosis: Schizoaffective disorder, bipolar type ([place]) Long Term Goal(s): Improvement in symptoms so as ready for discharge   Short Term Goals: Ability to identify changes in lifestyle to reduce recurrence of condition will improve Ability to verbalize feelings will improve Ability to disclose and discuss suicidal ideas Ability to demonstrate self-control will improve Compliance with prescribed medications will improve Ability to identify triggers associated with substance abuse/mental health issues will improve  Medication Management: Evaluate patient's response, side effects, and tolerance of medication regimen.  Therapeutic Interventions: 1 to 1 sessions, Unit Group sessions and Medication administration.  Evaluation of Outcomes: Not Met  Physician Treatment Plan for Secondary Diagnosis: Principal Problem:   Schizoaffective disorder, bipolar type ([place]) Active Problems:   Cocaine abuse, continuous use ([place])   Homelessness   Marijuana abuse in remission   Xerosis of skin  Long Term Goal(s): Improvement in symptoms so as ready for discharge   Short Term Goals: Ability to identify changes in lifestyle to reduce recurrence of condition will improve Ability to verbalize feelings will improve Ability to disclose and discuss suicidal ideas Ability to demonstrate self-control will improve Compliance with prescribed medications will improve Ability to identify triggers associated with substance abuse/mental health issues will improve     Medication Management: Evaluate patient's response, side effects, and tolerance of medication regimen.  Therapeutic Interventions: 1 to 1 sessions, Unit Group sessions and Medication  administration.  Evaluation of Outcomes: Not Met   RN Treatment Plan for Primary Diagnosis: Schizoaffective disorder, bipolar type ([place]) Long Term Goal(s):  Knowledge of disease and therapeutic regimen to maintain health will improve  Short Term Goals: Ability to remain free from injury will improve, Ability to participate in decision making will improve, Ability to verbalize feelings will improve, Ability to disclose and discuss suicidal ideas, and Ability to identify and develop effective coping behaviors will improve  Medication Management: RN will administer medications as ordered by provider, will assess and evaluate patient's response and provide education to patient for prescribed medication. RN will report any adverse and/or side effects to prescribing provider.  Therapeutic Interventions: 1 on 1 counseling sessions, Psychoeducation, Medication administration, Evaluate responses to treatment, Monitor vital signs and CBGs as ordered, Perform/monitor CIWA, COWS, AIMS and Fall Risk screenings as ordered, Perform wound care treatments as ordered.  Evaluation of Outcomes: Not Met   LCSW Treatment Plan for Primary Diagnosis: Schizoaffective disorder, bipolar type ([place]) Long Term Goal(s): Safe transition to appropriate next level of care at discharge, Engage patient in therapeutic group addressing interpersonal concerns.  Short Term Goals: Engage patient in aftercare planning with referrals and resources, Increase social support, Increase emotional regulation, Facilitate acceptance of mental health diagnosis and concerns, Identify triggers associated with mental health/substance abuse issues, and Increase skills for wellness and recovery  Therapeutic Interventions: Assess for all discharge needs, 1 to 1 time with Social worker, Explore available resources and support systems, Assess for adequacy in community support network, Educate family and significant other(s) on suicide prevention, Complete Psychosocial Assessment, Interpersonal group therapy.  Evaluation of Outcomes: Not Met   Progress in Treatment: Attending groups: No. Participating in  groups: No. Taking medication as prescribed: Yes. Toleration medication: Yes. Family/Significant other contact made: Yes, individual(s) contacted:  Friend  Patient understands diagnosis: No. Discussing patient identified problems/goals with staff: Yes. Medical problems stabilized or resolved: Yes. Denies suicidal/homicidal ideation: Yes. Issues/concerns per patient self-inventory: No.   New problem(s) identified: No, Describe:  None   New Short Term/Long Term Goal(s): medication stabilization, elimination of SI thoughts, development of comprehensive mental wellness plan.   Patient Goals: "To go to a shelter"   Discharge Plan or Barriers: Patient recently admitted. CSW will continue to follow and assess for appropriate referrals and possible discharge planning.   Reason for Continuation of Hospitalization: Depression Hallucinations Medication stabilization Suicidal ideation  Estimated Length of Stay: 3 to 5 days    Scribe for Treatment Team: Darleen Crocker, Latanya Presser 10/10/2021 2:23 PM

## 2021-10-10 NOTE — BHH Group Notes (Signed)
Patient did not attend the Psycho-Ed group. 

## 2021-10-10 NOTE — Plan of Care (Signed)
Nurse discussed anxiety, depression and coping skills with patient.  

## 2021-10-10 NOTE — Progress Notes (Signed)
[facility] MD Progress Note  10/10/2021 8:01 AM Tyler White  MRN:  [id number] Subjective:   Tyler White is a 25 year old male with a psychiatric history of schizoaffective disorder- bipolar type, cocaine use disorder-severe/dependent, alcohol use disorder-mild, and tobacco use disorder-mild who presented to [facility] ED for worsening depression, anxiety, and AH, and was admitted to [facility] for stabilization.   Per chart review, Tyler White has had multiple inpatient psychiatric admissions since 2018, mostly 2/2 substance-induced mood disorder. Tyler White says that his AH began at age 16; collateral from a previous admission stated, "Tyler White states that the patient's issues began when "he got laced" with some substance in high school. Tyler White says he has never been the same since and the rocking back and forth and hearing voices began at that time."  On Assessment Today (10/12): Case was discussed in the multidisciplinary team. MAR was reviewed and patient was compliant with medications. Patient seen, assessed, and discussed with attending Dr. Caswell Corwin  He reports that he is "okay" today, slept well last night, and appetite is good.  He denies any concerns or physical complaints today.  He maintains the goal of reinitiating his Risperdal LAI; he says that he follows at the [facility] behavioral health urgent care center for his injections.  He endorses that he would like to go to a shelter after discharging, but says that if possible, he would prefer to get into contact with his younger brother ()Electronics engineer (SP?)-Wilbon), who has previously stated the desire to help the patient.  He declines resources for substance use treatment, stating that the last time he used, he "did not like the effect it had on me."   He endorses ongoing auditory hallucinations, even during the assessment but he is unable to make out what the voices are saying.  He also endorses thought broadcasting, thought control (still unsure as to who is controlling his  thoughts), and messages sent just for him (he saw a piece of paper in bed with him reflecting yesterday's interview).  Reports no SI, HI, VH, ideas of reference, or paranoia on the unit.    When asked about rescinding his request for discharge, patient declines, stating that he would like to discharge by Friday.  In lengthy discussion with the patient, this Probation officer discussed the need to titrate up oral medications to reach the patient's goal of LAI, safe discharge planning, and concerns about ongoing psychosis for continued admission beyond 72 hours, but patient continued to decline.  He was informed that an involuntary commitment would be appropriate at this time for crisis stabilization and to assist with reaching patient's goal of treatment.  He voiced understanding without issue.  Principal Problem: Schizoaffective disorder, bipolar type ([place]) Diagnosis: Principal Problem:   Schizoaffective disorder, bipolar type ([place]) Active Problems:   Cocaine abuse, continuous use ([facility])   Homelessness   Marijuana abuse in remission   Xerosis of skin  Total Time spent with patient: 30 minutes  Past Psychiatric History: See H&P  Past Medical History:  Past Medical History:  Diagnosis Date   Hernia, inguinal, right    Inguinal hernia    right   Psychiatric illness    Schizophrenia ([place])    History reviewed. No pertinent surgical history. Family History:  Family History  Problem Relation Age of Onset   Psychiatric Illness Mother    Hypertension Sister    Family Psychiatric  History: See H&P Social History:  Social History   Substance and Sexual Activity  Alcohol Use Not Currently  Social History   Substance and Sexual Activity  Drug Use Not Currently   Types: Marijuana, Cocaine   Comment: denies currently    Social History   Socioeconomic History   Marital status: Single    Spouse name: Not on file   Number of children: Not on file   Years of education: 10   Highest education  level: Not on file  Occupational History   Not on file  Tobacco Use   Smoking status: Former    Packs/day: 0.50    Years: 5.00    Pack years: 2.50    Types: Cigarettes   Smokeless tobacco: Never  Vaping Use   Vaping Use: Never used  Substance and Sexual Activity   Alcohol use: Not Currently   Drug use: Not Currently    Types: Marijuana, Cocaine    Comment: denies currently   Sexual activity: Yes    Birth control/protection: None  Other Topics Concern   Not on file  Social History Narrative   ** Merged History Encounter **       ** Merged History Encounter **       Social Determinants of Health   Financial Resource Strain: Not on file  Food Insecurity: Not on file  Transportation Needs: Not on file  Physical Activity: Not on file  Stress: Not on file  Social Connections: Not on file   Additional Social History:      Sleep: Fair  Appetite:  Good  Current Medications: Current Facility-Administered Medications  Medication Dose Route Frequency Provider Last Rate Last Admin   acetaminophen (TYLENOL) tablet 650 mg  650 mg Oral Q6H PRN Starkes-Perry, Gayland Curry, FNP       alum & mag hydroxide-simeth (MAALOX/MYLANTA) 200-200-20 MG/5ML suspension 30 mL  30 mL Oral Q4H PRN Starkes-Perry, Gayland Curry, FNP       benztropine (COGENTIN) tablet 0.5 mg  0.5 mg Oral BID PRN Starkes-Perry, Gayland Curry, FNP       hydrocerin (EUCERIN) cream   Topical BID Rosezetta Schlatter, MD   1 application at 20/25/42 1659   hydrOXYzine (ATARAX/VISTARIL) tablet 25 mg  25 mg Oral TID PRN Rosezetta Schlatter, MD       magnesium hydroxide (MILK OF MAGNESIA) suspension 30 mL  30 mL Oral Daily PRN Starkes-Perry, Gayland Curry, FNP       risperidone (RISPERDAL M-TABS) disintegrating tablet 3 mg  3 mg Oral QHS Rosezetta Schlatter, MD   3 mg at 10/09/21 2236   traZODone (DESYREL) tablet 100 mg  100 mg Oral QHS PRN Suella Broad, FNP        Lab Results:  Results for orders placed or performed during the hospital  encounter of 10/08/21 (from the past 48 hour(s))  TSH     Status: None   Collection Time: 10/09/21  6:26 AM  Result Value Ref Range   TSH 1.675 0.350 - 4.500 uIU/mL    Comment: Performed by a 3rd Generation assay with a functional sensitivity of <=0.01 uIU/mL. Performed at [facility], [place] [address] [place], [place] [address]   Lipid panel     Status: None   Collection Time: 10/09/21  6:26 AM  Result Value Ref Range   Cholesterol 172 0 - 200 mg/dL   Triglycerides 131 <150 mg/dL   HDL 50 >40 mg/dL   Total CHOL/HDL Ratio 3.4 RATIO   VLDL 26 0 - 40 mg/dL   LDL Cholesterol 96 0 - 99 mg/dL    Comment:  Total Cholesterol/HDL:CHD Risk Coronary Heart Disease Risk Table                     Men   Women  1/2 Average Risk   3.4   3.3  Average Risk       5.0   4.4  2 X Average Risk   9.6   7.1  3 X Average Risk  23.4   11.0        Use the calculated Patient Ratio above and the CHD Risk Table to determine the patient's CHD Risk.        ATP III CLASSIFICATION (LDL):  <100     mg/dL   Optimal  100-129  mg/dL   Near or Above                    Optimal  130-159  mg/dL   Borderline  160-189  mg/dL   High  >190     mg/dL   Very High Performed at [facility] [address] [place], [place] [address]   Hemoglobin A1c     Status: None   Collection Time: 10/09/21  6:26 AM  Result Value Ref Range   Hgb A1c MFr Bld 5.0 4.8 - 5.6 %    Comment: (NOTE) Pre diabetes:          5.7%-6.4%  Diabetes:              >6.4%  Glycemic control for   <7.0% adults with diabetes    Mean Plasma Glucose 96.8 mg/dL    Comment: Performed at [facility] [address] [place], [place] [address]    Blood Alcohol level:  Lab Results  Component Value Date   ETH <10 10/07/2021   ETH <10 35/36/1443    Metabolic Disorder Labs: Lab Results  Component Value Date   HGBA1C 5.0 10/09/2021   MPG 96.8 10/09/2021   MPG 99.67 11/02/2020   Lab Results   Component Value Date   PROLACTIN 32.5 (H) 05/30/2018   Lab Results  Component Value Date   CHOL 172 10/09/2021   TRIG 131 10/09/2021   HDL 50 10/09/2021   CHOLHDL 3.4 10/09/2021   VLDL 26 10/09/2021   LDLCALC 96 10/09/2021   LDLCALC 68 11/02/2020    Physical Findings: AIMS: Facial and Oral Movements Muscles of Facial Expression: None, normal Lips and Perioral Area: None, normal Jaw: None, normal Tongue: None, normal,Extremity Movements Upper (arms, wrists, hands, fingers): None, normal Lower (legs, knees, ankles, toes): None, normal, Trunk Movements Neck, shoulders, hips: None, normal, Overall Severity Severity of abnormal movements (highest score from questions above): None, normal Incapacitation due to abnormal movements: None, normal Patient's awareness of abnormal movements (rate only patient's report): No Awareness, Dental Status Current problems with teeth and/or dentures?: No Does patient usually wear dentures?: No  CIWA:  CIWA-Ar Total: 1  Musculoskeletal: Strength & Muscle Tone: within normal limits Gait & Station: normal, steady Patient leans: N/A  Psychiatric Specialty Exam:  Presentation  General Appearance: Disheveled; Bizarre  Eye Contact:Fleeting  Speech:Blocked; Slow  Speech Volume:Decreased  Handedness:Right   Mood and Affect  Mood:Anxious; Dysphoric  Affect:Non-Congruent; Flat   Thought Process  Thought Processes:Linear  Descriptions of Associations:Tangential  Orientation:Full (Time, Place and Person)  Thought Content:Delusions; Tangential  History of Schizophrenia/Schizoaffective disorder:Yes  Duration of Psychotic Symptoms:Greater than six months  Hallucinations:Hallucinations: Auditory; Command; Visual  Ideas of Reference:Delusions; Paranoia  Suicidal Thoughts:Suicidal Thoughts: -- (reports  suicdal thoughts have decreased since admission)  Homicidal Thoughts:Homicidal Thoughts: No   Sensorium  Memory:Immediate Fair;  Recent Fair; Remote Fair  Judgment:Poor  Insight:Poor   Executive Functions  Concentration:Poor  Attention Span:Poor  [place]  Language:Good   Psychomotor Activity  Psychomotor Activity:Psychomotor Activity: Normal   Assets  Assets:Communication Skills; Desire for Improvement; Financial Resources/Insurance; Physical Health   Sleep  Sleep:Sleep: Poor Number of Hours of Sleep: 6.5 5.5   Physical Exam: Physical Exam Vitals and nursing note reviewed.  Constitutional:      General: He is not in acute distress.    Appearance: Normal appearance.  HENT:     Head: Normocephalic and atraumatic.  Pulmonary:     Effort: Pulmonary effort is normal.  Skin:    General: Skin is warm and dry.  Neurological:     General: No focal deficit present.     Mental Status: He is alert and oriented to person, place, and time.     Gait: Gait normal.   Review of Systems  Constitutional: Negative.   Respiratory:  Negative for shortness of breath.   Cardiovascular:  Negative for chest pain.  Gastrointestinal: Negative.   Genitourinary: Negative.   Neurological:  Negative for tremors and headaches.  Blood pressure 122/90, pulse 97, temperature 97.6 F (36.4 C), temperature source Oral, resp. rate 16, height 5\' 9"  (1.753 m), weight 67.1 kg, SpO2 100 %. Body mass index is 21.86 kg/m.   Treatment Plan Summary: Dameion is a 25 year old male with a history of schizoaffective disorder-bipolar type, cocaine use disorder, and multiple past inpatient psychiatric admissions who was admitted for worsening depression and auditory hallucinations after being released from jail.  Patient appears to have shallow insight into the need for treatment, and declined to receive and his 72-hour discharge request.  Because of ongoing psychosis and lack of safe discharge planning, we believe it best to place patient under an involuntary commitment.  This was discussed with patient,  and he voiced understanding.  Daily contact with patient to assess and evaluate symptoms and progress in treatment and Medication management  Schizoaffective disorder- BP type -Increase Risperdal to 4 mg nightly, will switch to LAI during this admission   Cocaine Use disorder- continuous use Marijuana use disorder- in remission No withdrawal symptoms experienced at this time - Advised discontinuation of drug use.  We will offer patient substance use treatment options.   Medical Management Covid negative CMP: Unremarkable CBC: unremarkable EtOH: <10 UDS: Positive cocaine TSH: 1.675 A1C: 5.0% Lipids: Unremarkable   Xerosis of skin Itching in bilateral antecubital fossae, did not appear eczematous -Ordered Eucerin cream BID as needed.  Discharge Planning:              -- Social work and case management to assist with discharge planning and identification of hospital follow-up needs prior to discharge.              -- Estimated LOS: 5-7 days (although patient is currently under IVC).            -- Discharge Concerns: Need to establish a safety plan; Medication compliance and effectiveness             -- Discharge Goals: To shelter or with family with outpatient referrals for mental health follow-up including medication management/psychotherapy/substance use treatment  Rosezetta Schlatter, MD PGY-1 10/10/2021 [place] Department of Psychiatry

## 2021-10-10 NOTE — Progress Notes (Addendum)
D:  Patient denied SI and HI, contracts for safety.  Denied visual hallucinations.  Does hear voices at times. A:  No medications administered today.   Emotional support and encouragement given patient. R:  Safety maintained with 15 minute checks.

## 2021-10-10 NOTE — Group Note (Signed)
LCSW Group Therapy Note  Group Date: 10/10/2021 Start Time: 1300 End Time: 1400   Type of Therapy and Topic:  Group Therapy: Using "I" Statements  Participation Level:  Did Not Attend  Description of Group:  Patients were asked to provide details of some interpersonal conflicts they have experienced. Patients were then educated about "I" statements, communication which focuses on feelings or views of the speaker rather than what the other person is doing. T group members were asked to reflect on past conflicts and to provide specific examples for utilizing "I" statements.  Therapeutic Goals:  Patients will verbalize understanding of ineffective communication and effective communication. Patients will be able to empathize with whom they are having conflict. Patients will practice effective communication in the form of "I" statements.    Summary of Patient Progress:  Did not attend   Therapeutic Modalities:   Cognitive Behavioral Therapy Solution-Focused Therapy    Darleen Crocker, LCSW 10/10/2021  2:10 PM

## 2021-10-10 NOTE — Group Note (Signed)
Recreation Therapy Group Note   Group Topic:Stress Management  Group Date: 10/10/2021 Start Time: 0930 End Time: 0950 Facilitators: Victorino Sparrow, LRT/CTRS Location: [address] Dayroom  Goal Area(s) Addresses:  Patient will actively participate in stress management techniques presented during session.  Patient will successfully identify benefit of practicing stress management post d/c.    Group Description:  LRT explained the stress management technique of meditation. LRT proceeded to play a meditation focused on self forgiveness.  Patient was asked to participate in the technique introduced during session.  Patients were given suggestions of ways to access scripts post d/c and encouraged to explore Youtube and other apps available on smartphones, tablets, and computers.   Affect/Mood: N/A   Participation Level: Did not attend    Clinical Observations/Individualized Feedback: Pt did not attend group.    Plan: Continue to engage patient in RT group sessions 2-3x/week.   Victorino Sparrow, LRT/CTRS 10/10/2021 12:38 PM

## 2021-10-10 NOTE — [facility] Group Notes (Signed)
Pt did not attend goals group. 

## 2021-10-10 NOTE — B[facility] Group Notes (Signed)
[place] Group Notes:  (Nursing/MHT/Case Management/Adjunct)  Date:  10/10/2021  Time:  8:40 PM  Type of Therapy:   Wrap up group  Participation Level:  Active  Participation Quality:  Appropriate and Attentive  Affect:  Appropriate  Cognitive:  Appropriate  Insight:  Appropriate and Improving  Engagement in Group:  Developing/Improving  Modes of Intervention:  Discussion  Summary of Progress/Problems: PT was calm and a little nervous to speak in group but he did. PT spoke about want to receive more resources so that he can do better.   Maxine Glenn 10/10/2021, 8:40 PM

## 2021-10-10 NOTE — [facility] Group Notes (Signed)
Adult Psychoeducational Group Note  Date:  10/10/2021 Time:  11:14 AM  Group Topic/Focus:  Personal Choices and Values:   The focus of this group is to help patients assess and explore the importance of values in their lives, how their values affect their decisions, how they express their values and what opposes their expression.  Participation Level:  Minimal  Participation Quality:  Attentive  Affect:  Flat  Cognitive:  Lacking  Insight: Limited  Engagement in Group:  Poor  Modes of Intervention:  Discussion  Tyler White 10/10/2021, 11:14 AM

## 2021-10-11 MED ORDER — BACITRACIN-NEOMYCIN-POLYMYXIN 400-5-5000 EX OINT
TOPICAL_OINTMENT | Freq: Two times a day (BID) | CUTANEOUS | Status: DC
  Filled 2021-10-11 (×4): qty 1

## 2021-10-11 MED ORDER — RISPERIDONE 2 MG PO TBDP
5.0000 mg | ORAL_TABLET | Freq: Every day | ORAL | Status: DC
  Administered 2021-10-11 – 2021-10-13 (×3): 5 mg via ORAL
  Filled 2021-10-11 (×6): qty 1

## 2021-10-11 NOTE — Progress Notes (Signed)
   10/10/21 2300  Psych Admission Type (Psych Patients Only)  Admission Status Voluntary  Psychosocial Assessment  Patient Complaints Sadness  Eye Contact Avertive;Brief  Facial Expression Flat  Affect Blunted;Flat  Speech Logical/coherent;Soft;Elective mutism  Interaction Cautious  Motor Activity Slow  Appearance/Hygiene In scrubs  Behavior Characteristics Anxious  Mood Anxious  Thought Process  Coherency Blocking  Content WDL  Delusions None reported or observed  Perception WDL  Hallucination None reported or observed  Judgment Poor  Confusion None  Danger to Self  Current suicidal ideation? Denies  Danger to Others  Danger to Others None reported or observed  Danger to Others Abnormal  Harmful Behavior to others No threats or harm toward other people  Destructive Behavior No threats or harm toward property

## 2021-10-11 NOTE — Progress Notes (Signed)
   10/11/21 2111  Psych Admission Type (Psych Patients Only)  Admission Status Voluntary  Psychosocial Assessment  Patient Complaints None  Eye Contact Avertive;Brief  Facial Expression Flat  Affect Blunted;Flat  Speech Logical/coherent;Soft;Elective mutism  Interaction Cautious  Motor Activity Slow  Appearance/Hygiene Unremarkable  Behavior Characteristics Cooperative;Appropriate to situation  Mood Depressed  Thought Process  Coherency Blocking  Content WDL  Delusions None reported or observed  Perception Hallucinations  Hallucination Auditory (Patient reports the voices have decreased)  Judgment Poor  Confusion None  Danger to Self  Current suicidal ideation? Denies  Danger to Others  Danger to Others None reported or observed  Danger to Others Abnormal  Harmful Behavior to others No threats or harm toward other people  Destructive Behavior No threats or harm toward property

## 2021-10-11 NOTE — Progress Notes (Signed)
[facility] MD Progress Note  10/11/2021 7:26 AM Tyler White  MRN:  [id number] Subjective:   Tyler White is a 25 year old male with a psychiatric history of schizoaffective disorder- bipolar type, cocaine use disorder-severe/dependent, alcohol use disorder-mild, and tobacco use disorder-mild who presented to [facility] ED for worsening depression, anxiety, and AH, and was admitted to [facility] for stabilization.   Per chart review, Tyler White has had multiple inpatient psychiatric admissions since 2018, mostly 2/2 substance-induced mood disorder. Tyler White says that his AH began at age 50; collateral from a previous admission stated, "Tyler White states that the patient's issues began when "he got laced" with some substance in high school. She says he has never been the same since and the rocking back and forth and hearing voices began at that time."  On Assessment Today (10/13): Case was discussed in the multidisciplinary team. MAR was reviewed and patient was compliant with medications. Patient seen, assessed, and discussed with attending Dr. Caswell Corwin  He reports that he is "okay" today, slept well last night, and appetite is good.  He denies any concerns or physical complaints today.  He maintains the goal of reinitiating his Risperdal LAI. He endorses ongoing auditory hallucinations, even during the assessment but he is still unable to make out what the voices are saying.  He also endorses thought broadcasting, thought control (still unsure as to who is controlling his thoughts), and but denies messages sent just for him.  Reports no SI, HI, VH, ideas of reference, or paranoia on the unit.    Principal Problem: Schizoaffective disorder, bipolar type (Tyler White) Diagnosis: Principal Problem:   Schizoaffective disorder, bipolar type (Tyler White) Active Problems:   Cocaine abuse, continuous use (HCC)   Homelessness   Marijuana abuse in remission   Xerosis of skin  Total Time spent with patient: 30 minutes  Past Psychiatric History:  See H&P  Past Medical History:  Past Medical History:  Diagnosis Date   Hernia, inguinal, right    Inguinal hernia    right   Psychiatric illness    Schizophrenia ([place])    History reviewed. No pertinent surgical history. Family History:  Family History  Problem Relation Age of Onset   Psychiatric Illness Mother    Hypertension Sister    Family Psychiatric  History: See H&P Social History:  Social History   Substance and Sexual Activity  Alcohol Use Not Currently     Social History   Substance and Sexual Activity  Drug Use Not Currently   Types: Marijuana, Cocaine   Comment: denies currently    Social History   Socioeconomic History   Marital status: Single    Spouse name: Not on file   Number of children: Not on file   Years of education: 10   Highest education level: Not on file  Occupational History   Not on file  Tobacco Use   Smoking status: Former    Packs/day: 0.50    Years: 5.00    Pack years: 2.50    Types: Cigarettes   Smokeless tobacco: Never  Vaping Use   Vaping Use: Never used  Substance and Sexual Activity   Alcohol use: Not Currently   Drug use: Not Currently    Types: Marijuana, Cocaine    Comment: denies currently   Sexual activity: Yes    Birth control/protection: None  Other Topics Concern   Not on file  Social History Narrative   ** Merged History Encounter **       ** Merged History Encounter **  Social Determinants of Health   Financial Resource Strain: Not on file  Food Insecurity: Not on file  Transportation Needs: Not on file  Physical Activity: Not on file  Stress: Not on file  Social Connections: Not on file   Additional Social History:      Sleep: Fair  Appetite:  Good  Current Medications: Current Facility-Administered Medications  Medication Dose Route Frequency Provider Last Rate Last Admin   acetaminophen (TYLENOL) tablet 650 mg  650 mg Oral Q6H PRN Starkes-Perry, Gayland Curry, FNP       alum & mag  hydroxide-simeth (MAALOX/MYLANTA) 200-200-20 MG/5ML suspension 30 mL  30 mL Oral Q4H PRN Starkes-Perry, Gayland Curry, FNP       benztropine (COGENTIN) tablet 0.5 mg  0.5 mg Oral BID PRN Starkes-Perry, Gayland Curry, FNP       hydrocerin (EUCERIN) cream   Topical PRN Rosezetta Schlatter, MD       hydrOXYzine (ATARAX/VISTARIL) tablet 25 mg  25 mg Oral TID PRN Rosezetta Schlatter, MD       magnesium hydroxide (MILK OF MAGNESIA) suspension 30 mL  30 mL Oral Daily PRN Starkes-Perry, Gayland Curry, FNP       risperiDONE (RISPERDAL M-TABS) disintegrating tablet 4 mg  4 mg Oral QHS Massengill, Nathan, MD   4 mg at 10/10/21 2108   traZODone (DESYREL) tablet 100 mg  100 mg Oral QHS PRN Suella Broad, FNP        Lab Results:  No results found for this or any previous visit (from the past 48 hour(s)).   Blood Alcohol level:  Lab Results  Component Value Date   ETH <10 10/07/2021   ETH <10 96/75/9163    Metabolic Disorder Labs: Lab Results  Component Value Date   HGBA1C 5.0 10/09/2021   MPG 96.8 10/09/2021   MPG 99.67 11/02/2020   Lab Results  Component Value Date   PROLACTIN 32.5 (H) 05/30/2018   Lab Results  Component Value Date   CHOL 172 10/09/2021   TRIG 131 10/09/2021   HDL 50 10/09/2021   CHOLHDL 3.4 10/09/2021   VLDL 26 10/09/2021   LDLCALC 96 10/09/2021   LDLCALC 68 11/02/2020    Physical Findings: AIMS: Facial and Oral Movements Muscles of Facial Expression: None, normal Lips and Perioral Area: None, normal Jaw: None, normal Tongue: None, normal,Extremity Movements Upper (arms, wrists, hands, fingers): None, normal Lower (legs, knees, ankles, toes): None, normal, Trunk Movements Neck, shoulders, hips: None, normal, Overall Severity Severity of abnormal movements (highest score from questions above): None, normal Incapacitation due to abnormal movements: None, normal Patient's awareness of abnormal movements (rate only patient's report): No Awareness, Dental Status Current problems  with teeth and/or dentures?: No Does patient usually wear dentures?: No  CIWA:  CIWA-Ar Total: 1  Musculoskeletal: Strength & Muscle Tone: within normal limits Gait & Station: normal, steady Patient leans: N/A  Psychiatric Specialty Exam:  Presentation  General Appearance: Appropriate for Environment; Casual  Eye Contact:Good  Speech:Clear and Coherent; Normal Rate; Blocked (Sometimes blocked, which patient acknowledges he is hearing voices at that time)  Speech Volume:Normal  Handedness:Right   Mood and Affect  Mood:Euthymic  Affect:Congruent; Restricted   Thought Process  Thought Processes:Coherent; Goal Directed  Descriptions of Associations:Intact  Orientation:Full (Time, Place and Person)  Thought Content:Logical  History of Schizophrenia/Schizoaffective disorder:Yes  Duration of Psychotic Symptoms:Greater than six months  Hallucinations:Hallucinations: Auditory Description of Auditory Hallucinations: pt reported that he hears voice however he states "i don't know what the voices  are saying"  Ideas of Reference:None  Suicidal Thoughts:Suicidal Thoughts: No  Homicidal Thoughts:Homicidal Thoughts: No   Sensorium  Memory:Immediate Fair; Recent Fair; Remote Fair  Judgment:Poor  Insight:Shallow   Executive Functions  Concentration:Fair  Attention Span:Fair  [place]  Language:Good   Psychomotor Activity  Psychomotor Activity:Psychomotor Activity: Normal   Assets  Assets:Desire for Improvement; Physical Health; Resilience   Sleep  Sleep:Sleep: Good Number of Hours of Sleep: 6.75   Physical Exam: Physical Exam Vitals and nursing note reviewed.  Constitutional:      General: He is not in acute distress.    Appearance: Normal appearance.  HENT:     Head: Normocephalic and atraumatic.  Pulmonary:     Effort: Pulmonary effort is normal.  Skin:    General: Skin is warm and dry.  Neurological:      General: No focal deficit present.     Mental Status: He is alert and oriented to person, place, and time.     Gait: Gait normal.   Review of Systems  Constitutional: Negative.   Respiratory:  Negative for shortness of breath.   Cardiovascular:  Negative for chest pain.  Gastrointestinal: Negative.   Genitourinary: Negative.   Neurological:  Negative for tremors and headaches.  Blood pressure 105/84, pulse (!) 128, temperature 97.7 F (36.5 C), resp. rate 16, height 5\' 9"  (1.753 m), weight 67.1 kg, SpO2 98 %. Body mass index is 21.86 kg/m.   Treatment Plan Summary: Jadrian is a 25 year old male with a history of schizoaffective disorder-bipolar type, cocaine use disorder, and multiple past inpatient psychiatric admissions who was admitted for worsening depression and auditory hallucinations after being released from jail.  Patient appears to have shallow insight into the need for treatment, and declined to receive and his 72-hour discharge request.  Because of ongoing psychosis and lack of safe discharge planning, we believe it best to place patient under an involuntary commitment.  This was discussed with patient, and he voiced understanding.  Daily contact with patient to assess and evaluate symptoms and progress in treatment and Medication management  Schizoaffective disorder- BP type -Increase Risperdal to 5 mg nightly, will switch to LAI during this admission -Cogentin 0.5 mg available PRN for EPS   Cocaine Use disorder- continuous use Marijuana use disorder- in remission No withdrawal symptoms experienced at this time - Advised discontinuation of drug use.  We will offer patient substance use treatment options.   Medical Management Covid negative CMP: Unremarkable CBC: unremarkable EtOH: <10 UDS: Positive cocaine TSH: 1.675 A1C: 5.0% Lipids: Unremarkable   Xerosis of skin Itching in bilateral antecubital fossae, did not appear eczematous -Ordered Eucerin cream BID as  needed.  Discharge Planning:              -- Social work and case management to assist with discharge planning and identification of hospital follow-up needs prior to discharge.              -- Estimated LOS: 5-7 days (although patient is currently under IVC).            -- Discharge Concerns: Need to establish a safety plan; Medication compliance and effectiveness             -- Discharge Goals: To shelter or with family with outpatient referrals for mental health follow-up including medication management/psychotherapy/substance use treatment  Rosezetta Schlatter, MD PGY-1 10/11/2021 [place] Department of Psychiatry

## 2021-10-11 NOTE — [facility] Suicide Risk Assessment (Addendum)
[place] INPATIENT:  Family/Significant Other Suicide Prevention Education  Suicide Prevention Education:  Contact Attempts: Mistery Maness [phone], (name of family member/significant other) has been identified by the patient as the family member/significant other with whom the patient will be residing, and identified as the person(s) who will aid the patient in the event of a mental health crisis.  With written consent from the patient, two attempts were made to provide suicide prevention education, prior to and/or following the patient's discharge.  We were unsuccessful in providing suicide prevention education.  A suicide education pamphlet was given to the patient to share with family/significant other.  Date and time of first attempt:10/11/21   /10:30am Date and time of second attempt: 10/17 /11:21am  Mliss Fritz 10/11/2021, 10:45 AM

## 2021-10-11 NOTE — Progress Notes (Signed)
   10/11/21 1100  Psych Admission Type (Psych Patients Only)  Admission Status Voluntary  Psychosocial Assessment  Patient Complaints None  Eye Contact Avertive;Brief  Facial Expression Flat  Affect Blunted;Flat  Speech Logical/coherent;Soft;Elective mutism  Interaction Cautious  Motor Activity Slow  Appearance/Hygiene In scrubs  Behavior Characteristics Cooperative  Mood Depressed  Thought Process  Coherency Blocking  Content WDL  Delusions None reported or observed  Perception WDL  Hallucination None reported or observed  Judgment Poor  Confusion None  Danger to Self  Current suicidal ideation? Denies  Danger to Others  Danger to Others None reported or observed  Danger to Others Abnormal  Harmful Behavior to others No threats or harm toward other people  Destructive Behavior No threats or harm toward property  Dar Note:Patient presents with a flat affect and depressed mood.  Denies suicidal thoughts, auditory and visual hallucinations.  Medications given as prescribed.  Safety checks maintained.  Neosporin applied to open area on scalp.  Patient is safe on and off the unit.

## 2021-10-11 NOTE — Progress Notes (Signed)
Pt did not attend group after verbal prompt. 

## 2021-10-11 NOTE — [facility] Group Notes (Signed)
[place] Group Notes:  (Nursing/MHT/Case Management/Adjunct)  Date:  10/11/2021  Time:  8:35 PM  Type of Therapy:   wrap up  Participation Level:  Active  Participation Quality:  Appropriate and Attentive  Affect:  Appropriate and PT is slow to speak.   Cognitive:  Appropriate  Insight:  Improving  Engagement in Group:  Developing/Improving  Modes of Intervention:  Discussion  Summary of Progress/Problems: PT was neutral and his speech is slow. PT said he enjoyed his meal today and wants to work on his discharge plan.   Maxine Glenn 10/11/2021, 8:35 PM

## 2021-10-11 NOTE — [facility] Group Notes (Signed)
Patient did not attend morning orientation/goal group.

## 2021-10-12 NOTE — Progress Notes (Addendum)
[facility] MD Progress Note  10/12/2021 2:22 PM Tyler White  MRN:  [id number] Subjective:   Tyler White is a 25 year old male with a psychiatric history of schizoaffective disorder- bipolar type, cocaine use disorder-severe/dependent, alcohol use disorder-mild, and tobacco use disorder-mild who presented to [facility] ED for worsening depression, anxiety, and AH, and was admitted to [facility] for stabilization.   Per chart review, Tyler White has had multiple inpatient psychiatric admissions since 2018, mostly 2/2 substance-induced mood disorder. Tyler White says that his AH began at age 47; collateral from a previous admission stated, "Tyler White states that the patient's issues began when "he got laced" with some substance in high school. She says he has never been the same since and the rocking back and forth and hearing voices began at that time."  On Assessment Today (10/14): Case was discussed in the multidisciplinary team. MAR was reviewed and patient was compliant with medications. Patient seen, assessed, and discussed with attending Dr. Caswell Corwin.  He reports that today is "decent; just a regular day," slept well last night, and appetite is good.  He denies any concerns or physical complaints today.  He maintains the goal of reinitiating his Risperdal LAI. He endorses ongoing auditory hallucinations, although these have been improving.  He also endorses thought broadcasting, thought control (still unsure as to who is controlling his thoughts), but denies all other first-rank sx.  Reports no SI, HI, VH, ideas of reference, or paranoia on the unit.  Although patient denies feeling down and depressed, he appears so; patient offered an anti-depressant medication but he declined.  Principal Problem: Schizoaffective disorder, bipolar type ([place]) Diagnosis: Principal Problem:   Schizoaffective disorder, bipolar type ([place]) Active Problems:   Cocaine abuse, continuous use (H[facility])   Homelessness   Marijuana abuse in remission    Xerosis of skin  Total Time spent with patient: 30 minutes  Past Psychiatric History: See H&P  Past Medical History:  Past Medical History:  Diagnosis Date   Hernia, inguinal, right    Inguinal hernia    right   Psychiatric illness    Schizophrenia ([place])    History reviewed. No pertinent surgical history. Family History:  Family History  Problem Relation Age of Onset   Psychiatric Illness Mother    Hypertension Sister    Family Psychiatric  History: See H&P Social History:  Social History   Substance and Sexual Activity  Alcohol Use Not Currently     Social History   Substance and Sexual Activity  Drug Use Not Currently   Types: Marijuana, Cocaine   Comment: denies currently    Social History   Socioeconomic History   Marital status: Single    Spouse name: Not on file   Number of children: Not on file   Years of education: 10   Highest education level: Not on file  Occupational History   Not on file  Tobacco Use   Smoking status: Former    Packs/day: 0.50    Years: 5.00    Pack years: 2.50    Types: Cigarettes   Smokeless tobacco: Never  Vaping Use   Vaping Use: Never used  Substance and Sexual Activity   Alcohol use: Not Currently   Drug use: Not Currently    Types: Marijuana, Cocaine    Comment: denies currently   Sexual activity: Yes    Birth control/protection: None  Other Topics Concern   Not on file  Social History Narrative   ** Merged History Encounter **       **  Merged History Encounter **       Social Determinants of Health   Financial Resource Strain: Not on file  Food Insecurity: Not on file  Transportation Needs: Not on file  Physical Activity: Not on file  Stress: Not on file  Social Connections: Not on file   Additional Social History:      Sleep: Fair  Appetite:  Good  Current Medications: Current Facility-Administered Medications  Medication Dose Route Frequency Provider Last Rate Last Admin   acetaminophen  (TYLENOL) tablet 650 mg  650 mg Oral Q6H PRN Starkes-Perry, Gayland Curry, FNP       alum & mag hydroxide-simeth (MAALOX/MYLANTA) 200-200-20 MG/5ML suspension 30 mL  30 mL Oral Q4H PRN Starkes-Perry, Gayland Curry, FNP       benztropine (COGENTIN) tablet 0.5 mg  0.5 mg Oral BID PRN Starkes-Perry, Gayland Curry, FNP       hydrocerin (EUCERIN) cream   Topical PRN Rosezetta Schlatter, MD       hydrOXYzine (ATARAX/VISTARIL) tablet 25 mg  25 mg Oral TID PRN Rosezetta Schlatter, MD   25 mg at 10/12/21 1332   magnesium hydroxide (MILK OF MAGNESIA) suspension 30 mL  30 mL Oral Daily PRN Suella Broad, FNP       neomycin-bacitracin-polymyxin (NEOSPORIN) ointment packet   Topical BID Janine Limbo, MD   Given at 10/12/21 8250   risperiDONE (RISPERDAL M-TABS) disintegrating tablet 5 mg  5 mg Oral QHS Rosezetta Schlatter, MD   5 mg at 10/11/21 2111   traZODone (DESYREL) tablet 100 mg  100 mg Oral QHS PRN Suella Broad, FNP        Lab Results:  No results found for this or any previous visit (from the past 48 hour(s)).   Blood Alcohol level:  Lab Results  Component Value Date   ETH <10 10/07/2021   ETH <10 53/97/6734    Metabolic Disorder Labs: Lab Results  Component Value Date   HGBA1C 5.0 10/09/2021   MPG 96.8 10/09/2021   MPG 99.67 11/02/2020   Lab Results  Component Value Date   PROLACTIN 32.5 (H) 05/30/2018   Lab Results  Component Value Date   CHOL 172 10/09/2021   TRIG 131 10/09/2021   HDL 50 10/09/2021   CHOLHDL 3.4 10/09/2021   VLDL 26 10/09/2021   LDLCALC 96 10/09/2021   LDLCALC 68 11/02/2020    Physical Findings: AIMS: Facial and Oral Movements Muscles of Facial Expression: None, normal Lips and Perioral Area: None, normal Jaw: None, normal Tongue: None, normal,Extremity Movements Upper (arms, wrists, hands, fingers): None, normal Lower (legs, knees, ankles, toes): None, normal, Trunk Movements Neck, shoulders, hips: None, normal, Overall Severity Severity of abnormal  movements (highest score from questions above): None, normal Incapacitation due to abnormal movements: None, normal Patient's awareness of abnormal movements (rate only patient's report): No Awareness, Dental Status Current problems with teeth and/or dentures?: No Does patient usually wear dentures?: No  CIWA:  CIWA-Ar Total: 1  Musculoskeletal: Strength & Muscle Tone: within normal limits Gait & Station: normal, steady Patient leans: N/A  Psychiatric Specialty Exam:  Presentation  General Appearance: Appropriate for Environment; Casual  Eye Contact:Fleeting  Speech:Clear and Coherent; Normal Rate  Speech Volume:Normal  Handedness:Right   Mood and Affect  Mood:Euthymic  Affect:Congruent; Restricted   Thought Process  Thought Processes:Coherent; Goal Directed  Descriptions of Associations:Intact  Orientation:Full (Time, Place and Person)  Thought Content:Logical  History of Schizophrenia/Schizoaffective disorder:Yes  Duration of Psychotic Symptoms:Greater than six months  Hallucinations:Hallucinations: Auditory  Description of Auditory Hallucinations: pt reported that he hears voice however he states "i don't know what the voices are saying"  Ideas of Reference:None  Suicidal Thoughts:Suicidal Thoughts: No  Homicidal Thoughts:Homicidal Thoughts: No   Sensorium  Memory:Immediate Fair; Recent Fair; Remote [place]   Executive Functions  Concentration:Good  Attention Span:Good  [place]  Language:Good   Psychomotor Activity  Psychomotor Activity:Psychomotor Activity: Normal   Assets  Assets:Desire for Improvement; Physical Health; Resilience   Sleep  Sleep:Sleep: Good Number of Hours of Sleep: 6.75   Physical Exam: Physical Exam Vitals and nursing note reviewed.  Constitutional:      General: He is not in acute distress.    Appearance: Normal appearance.  HENT:      Head: Normocephalic and atraumatic.  Pulmonary:     Effort: Pulmonary effort is normal.  Skin:    General: Skin is warm and dry.  Neurological:     General: No focal deficit present.     Mental Status: He is alert and oriented to person, place, and time.     Gait: Gait normal.   Review of Systems  Constitutional: Negative.   Respiratory:  Negative for shortness of breath.   Cardiovascular:  Negative for chest pain.  Gastrointestinal: Negative.   Genitourinary: Negative.   Neurological:  Negative for tremors and headaches.  Blood pressure 115/76, pulse 83, temperature 97.6 F (36.4 C), resp. rate 16, height 5\' 9"  (1.753 m), weight 67.1 kg, SpO2 98 %. Body mass index is 21.86 kg/m.   Treatment Plan Summary: Taiwo is a 25 year old male with a history of schizoaffective disorder-bipolar type, cocaine use disorder, and multiple past inpatient psychiatric admissions who was admitted for worsening depression and auditory hallucinations after being released from jail.  Patient appears to have shallow insight into the need for treatment, and declined to receive and his 72-hour discharge request.  Because of ongoing psychosis and lack of safe discharge planning, we believe it best to place patient under an involuntary commitment.  This was discussed with patient, and he voiced understanding.  Daily contact with patient to assess and evaluate symptoms and progress in treatment and Medication management  Schizoaffective disorder- BP type -Continue Risperdal 5 mg nightly, will switch to LAI during this admission -Cogentin 0.5 mg available PRN for EPS   Cocaine Use disorder- continuous use Marijuana use disorder- in remission No withdrawal symptoms experienced at this time - Advised discontinuation of drug use.  We will offer patient substance use treatment options.   Medical Management Covid negative CMP: Unremarkable CBC: unremarkable EtOH: <10 UDS: Positive cocaine TSH: 1.675 A1C:  5.0% Lipids: Unremarkable   Xerosis of skin Itching in bilateral antecubital fossae, did not appear eczematous -Ordered Eucerin cream BID as needed.  Discharge Planning:              -- Social work and case management to assist with discharge planning and identification of hospital follow-up needs prior to discharge.              -- Estimated LOS: 5-7 days (although patient is currently under IVC).            -- Discharge Concerns: Need to establish a safety plan; Medication compliance and effectiveness             -- Discharge Goals: To shelter or with family with outpatient referrals for mental health follow-up including medication management/psychotherapy/substance use treatment  Rosezetta Schlatter, MD PGY-1 10/12/2021 [facility] Department of Psychiatry

## 2021-10-12 NOTE — Progress Notes (Signed)
Patient did attend the evening speaker [place] meeting. Pt was attentive, supportive, and engaged.

## 2021-10-12 NOTE — Progress Notes (Signed)
   10/12/21 2144  Psych Admission Type (Psych Patients Only)  Admission Status Involuntary  Psychosocial Assessment  Patient Complaints None  Eye Contact Avertive;Brief  Facial Expression Flat;Pensive  Affect Blunted;Flat  Speech Logical/coherent;Soft  Interaction Cautious;Minimal  Motor Activity Other (Comment) (WDL)  Appearance/Hygiene Unremarkable  Behavior Characteristics Cooperative;Appropriate to situation  Mood Depressed  Thought Process  Coherency Blocking  Content WDL  Delusions None reported or observed  Perception Hallucinations  Hallucination Auditory  Judgment Poor  Confusion None  Danger to Self  Current suicidal ideation? Denies  Danger to Others  Danger to Others None reported or observed  Danger to Others Abnormal  Harmful Behavior to others No threats or harm toward other people  Destructive Behavior No threats or harm toward property

## 2021-10-12 NOTE — Group Note (Signed)
LCSW Group Therapy Note   Group Date: 10/12/2021 Start Time: 1300 End Time: 1400   Type of Therapy and Topic:  Group Therapy:   Participation Level:  Active  Summary of Progress/Problems: Tyler White states that he is grateful for having his freedom back. Pt received the packet for group. Pt followed along throughout the group and discussed things to be grateful for with their peers.  Pt interacted appropriately and remained on topic.  Darleen Crocker, LCSWA 10/12/2021  1:53 PM

## 2021-10-12 NOTE — [facility] Group Notes (Signed)
The focus of this group is to help patients establish daily goals to achieve during treatment and discuss how the patient can incorporate goal setting into their daily lives to aide in recovery.  Pt did not attend morning goals and orientation group.

## 2021-10-12 NOTE — Progress Notes (Signed)
   10/12/21 1200  Psych Admission Type (Psych Patients Only)  Admission Status Involuntary  Psychosocial Assessment  Patient Complaints Depression  Eye Contact Avertive;Brief  Facial Expression Flat  Affect Blunted;Flat  Speech Logical/coherent;Soft;Elective mutism  Interaction Cautious  Motor Activity Slow  Appearance/Hygiene Unremarkable  Behavior Characteristics Cooperative  Mood Depressed  Thought Process  Coherency Blocking  Content WDL  Delusions None reported or observed  Perception Hallucinations  Hallucination Auditory (Patient reports the voices have decreased)  Judgment Poor  Confusion None  Danger to Self  Current suicidal ideation? Denies  Danger to Others  Danger to Others None reported or observed  Danger to Others Abnormal  Harmful Behavior to others No threats or harm toward other people  Destructive Behavior No threats or harm toward property

## 2021-10-13 MED ORDER — BENZTROPINE MESYLATE 0.5 MG PO TABS
0.5000 mg | ORAL_TABLET | Freq: Two times a day (BID) | ORAL | Status: DC
  Administered 2021-10-13 – 2021-10-23 (×20): 0.5 mg via ORAL
  Filled 2021-10-13 (×3): qty 1
  Filled 2021-10-13: qty 14
  Filled 2021-10-13 (×5): qty 1
  Filled 2021-10-13: qty 14
  Filled 2021-10-13 (×16): qty 1

## 2021-10-13 NOTE — Progress Notes (Signed)
[facility] Group Notes:  (Nursing/MHT/Case Management/Adjunct)  Date:  10/13/2021  Time:  2000  Type of Therapy:   wrap up group  Participation Level:  Active  Participation Quality:  Appropriate, Attentive, Sharing, and Supportive  Affect:  Depressed  Cognitive:  Hallucinating  Insight:  Improving  Engagement in Group:  Developing/Improving  Modes of Intervention:  Clarification, Education, and Support  Summary of Progress/Problems: Positive thinking and positive change were discussed.   Shellia Cleverly 10/13/2021, 8:48 PM

## 2021-10-13 NOTE — Group Note (Signed)
Group Topic: Goal Setting  Group Date: 10/13/2021 Start Time: 0900 End Time: 1000 Facilitators: Paulino Rily, RN  Department: [facility] INPATIENT ADULT [facility]  Number of Participants: 13  Group Focus: affirmation, clarity of thought, and goals/reality orientation Treatment Modality:  Psychoeducation Interventions utilized were assignment, group exercise, and support Purpose: To be able to understand and verbalize the reason for their admission to the hospital. To understand that the medication helps with their chemical imbalance but they also need to work on their choices in life. To be challenged to develop a list of 30 positives about themselves. Also introduce the concept that "feelings" are not reality.  Name: Sharvil Hoey Date of Birth: 1996/01/06  MR: [id number]    Level of Participation: did not attend Patients Problems:  Patient Active Problem List   Diagnosis Date Noted   Xerosis of skin 10/09/2021   Schizoaffective disorder ([place]) 10/08/2021   Schizoaffective disorder, bipolar type ([place]) 04/26/2021   Marijuana abuse in remission 04/26/2021   Polysubstance abuse ([place]) 04/23/2021   Homelessness 04/23/2021   Schizophrenia ([place]) 11/19/2020   Cocaine abuse, continuous use ([place]) 02/19/2018   Cocaine abuse with cocaine-induced mood disorder ([place]) 02/19/2018   Psychosis ([place]) 11/27/2017   Cannabis abuse with psychotic disorder, with delusions ([place]) 11/26/2017   Schizophrenia, unspecified ([place]) 11/26/2017

## 2021-10-13 NOTE — Progress Notes (Signed)
   10/13/21 2248  Psych Admission Type (Psych Patients Only)  Admission Status Involuntary  Psychosocial Assessment  Patient Complaints None  Eye Contact Brief  Facial Expression Flat  Affect Blunted  Speech Logical/coherent  Interaction Minimal  Motor Activity Other (Comment) (WDL)  Appearance/Hygiene Unremarkable  Behavior Characteristics Appropriate to situation  Mood Depressed  Thought Process  Coherency Blocking  Content WDL  Delusions None reported or observed  Perception Hallucinations  Hallucination Auditory  Judgment Poor  Confusion None  Danger to Self  Current suicidal ideation? Denies  Danger to Others  Danger to Others None reported or observed  Danger to Others Abnormal  Harmful Behavior to others No threats or harm toward other people  Destructive Behavior No threats or harm toward property

## 2021-10-13 NOTE — Progress Notes (Addendum)
[facility] MD Progress Note  10/13/2021 7:29 AM Tyler White  MRN:  [id number] Subjective:   Tyler White is a 25 year old male with a psychiatric history of schizoaffective disorder- bipolar type, cocaine use disorder-severe/dependent, alcohol use disorder-mild, and tobacco use disorder-mild who presented to [facility] ED for worsening depression, anxiety, and AH, and was admitted to [facility] for stabilization.   Per chart review, Tyler White has had multiple inpatient psychiatric admissions since 2018, mostly 2/2 substance-induced mood disorder. Tyler White says that his AH began at age 29; collateral from a previous admission stated, "Tyler White states that the Tyler White's issues began when "Tyler White got laced" with some substance in high school. She says Tyler White has never been the same since and the rocking back and forth and hearing voices began at that time."  On Assessment Today (10/15): Case was discussed in the multidisciplinary team. MAR was reviewed and Tyler White was compliant with medications. Tyler White seen, assessed, and discussed with attending Dr. Nelda Marseille.  Tyler White reports that today his mood is "stable," Tyler White slept well last night, and his appetite is good.  Tyler White denies any concerns or physical complaints today. Tyler White smiled when discussing his birthday; when his lack of previous smiling was acknowledged by this Probation officer, Tyler White says, "I know, I have been going through stressfulness."  Tyler White denies being down or depressed, but says that stress is induced by the voices which are usually suppressed on LAI.  Tyler White endorses ongoing auditory hallucinations, which have improved with the increase in Risperdal.  Tyler White continues to endorse thought broadcasting but denies thought control today as well as all other first-rank sx. Overall, Tyler White appears to be less internally  preoccupied reports no SI, HI, VH, ideas of reference, or paranoia on the unit.  Tyler White says that Tyler White has contact information for "[employer]" who informed him that Tyler White would help him find his brother before  Tyler White was incarcerated.  Tyler White declined to provide information for [employer] at this time, and says that Tyler White has not been taking his calls.  Principal Problem: Schizoaffective disorder, bipolar type ([place]) Diagnosis: Principal Problem:   Schizoaffective disorder, bipolar type ([place]) Active Problems:   Cocaine abuse, continuous use ([facility])   Homelessness   Marijuana abuse in remission   Xerosis of skin  Total Time spent with Tyler White: I personally spent 35 minutes on the unit in direct Tyler White care. The direct Tyler White care time included face-to-face time with the Tyler White, reviewing the Tyler White's chart, communicating with other professionals, and coordinating care. Greater than 50% of this time was spent in counseling or coordinating care with the Tyler White regarding goals of hospitalization, psycho-education, and discharge planning needs.   Past Psychiatric History: See H&P  Past Medical History:  Past Medical History:  Diagnosis Date   Hernia, inguinal, right    Inguinal hernia    right   Psychiatric illness    Schizophrenia ([place])    History reviewed. No pertinent surgical history. Family History:  Family History  Problem Relation Age of Onset   Psychiatric Illness Mother    Hypertension Sister    Family Psychiatric  History: See H&P Social History:  Social History   Substance and Sexual Activity  Alcohol Use Not Currently     Social History   Substance and Sexual Activity  Drug Use Not Currently   Types: Marijuana, Cocaine   Comment: denies currently    Social History   Socioeconomic History   Marital status: Single    Spouse name: Not on file   Number of  children: Not on file   Years of education: 10   Highest education level: Not on file  Occupational History   Not on file  Tobacco Use   Smoking status: Former    Packs/day: 0.50    Years: 5.00    Pack years: 2.50    Types: Cigarettes   Smokeless tobacco: Never  Vaping Use   Vaping Use: Never used  Substance and  Sexual Activity   Alcohol use: Not Currently   Drug use: Not Currently    Types: Marijuana, Cocaine    Comment: denies currently   Sexual activity: Yes    Birth control/protection: None  Other Topics Concern   Not on file  Social History Narrative   ** Merged History Encounter **       ** Merged History Encounter **       Social Determinants of Health   Financial Resource Strain: Not on file  Food Insecurity: Not on file  Transportation Needs: Not on file  Physical Activity: Not on file  Stress: Not on file  Social Connections: Not on file   Additional Social History:      Sleep: Fair  Appetite:  Good  Current Medications: Current Facility-Administered Medications  Medication Dose Route Frequency Provider Last Rate Last Admin   acetaminophen (TYLENOL) tablet 650 mg  650 mg Oral Q6H PRN Starkes-Perry, Gayland Curry, FNP       alum & mag hydroxide-simeth (MAALOX/MYLANTA) 200-200-20 MG/5ML suspension 30 mL  30 mL Oral Q4H PRN Starkes-Perry, Gayland Curry, FNP       benztropine (COGENTIN) tablet 0.5 mg  0.5 mg Oral BID PRN Starkes-Perry, Gayland Curry, FNP       hydrocerin (EUCERIN) cream   Topical PRN Rosezetta Schlatter, MD       hydrOXYzine (ATARAX/VISTARIL) tablet 25 mg  25 mg Oral TID PRN Rosezetta Schlatter, MD   25 mg at 10/12/21 2144   magnesium hydroxide (MILK OF MAGNESIA) suspension 30 mL  30 mL Oral Daily PRN Suella Broad, FNP       neomycin-bacitracin-polymyxin (NEOSPORIN) ointment packet   Topical BID Janine Limbo, MD   Given at 10/12/21 6213   risperiDONE (RISPERDAL M-TABS) disintegrating tablet 5 mg  5 mg Oral QHS Rosezetta Schlatter, MD   5 mg at 10/12/21 2144   traZODone (DESYREL) tablet 100 mg  100 mg Oral QHS PRN Suella Broad, FNP        Lab Results:  No results found for this or any previous visit (from the past 48 hour(s)).   Blood Alcohol level:  Lab Results  Component Value Date   ETH <10 10/07/2021   ETH <10 08/65/7846    Metabolic Disorder  Labs: Lab Results  Component Value Date   HGBA1C 5.0 10/09/2021   MPG 96.8 10/09/2021   MPG 99.67 11/02/2020   Lab Results  Component Value Date   PROLACTIN 32.5 (H) 05/30/2018   Lab Results  Component Value Date   CHOL 172 10/09/2021   TRIG 131 10/09/2021   HDL 50 10/09/2021   CHOLHDL 3.4 10/09/2021   VLDL 26 10/09/2021   LDLCALC 96 10/09/2021   LDLCALC 68 11/02/2020    Physical Findings: AIMS: Facial and Oral Movements Muscles of Facial Expression: None, normal Lips and Perioral Area: None, normal Jaw: None, normal Tongue: None, normal,Extremity Movements Upper (arms, wrists, hands, fingers): None, normal Lower (legs, knees, ankles, toes): None, normal, Trunk Movements Neck, shoulders, hips: None, normal, Overall Severity Severity of abnormal movements (highest score  from questions above): None, normal Incapacitation due to abnormal movements: None, normal Tyler White's awareness of abnormal movements (rate only Tyler White's report): No Awareness, Dental Status Current problems with teeth and/or dentures?: No Does Tyler White usually wear dentures?: No  CIWA:  CIWA-Ar Total: 1  Musculoskeletal: Strength & Muscle Tone: within normal limits Gait & Station: normal, steady Tyler White leans: N/A  Psychiatric Specialty Exam:  Presentation  General Appearance: Appropriate for Environment; Casual  Eye Contact:Fleeting  Speech:Clear and Coherent; Normal Rate  Speech Volume:Normal  Handedness:Right   Mood and Affect  Mood: appears aloof but described as "I am stable"  Affect:constricted   Thought Process  Thought Processes:Coherent; Goal Directed  Descriptions of Associations:Intact  Orientation:Full (Time, Place and Person)  Thought Content: Continues to endorse ongoing auditory hallucinations, which have improved, as well as thought broadcasting; denies SI/HI/VH, paranoia, and other first rank symptoms (including thought control, which Tyler White previously  endorsed) History of Schizophrenia/Schizoaffective disorder:Yes  Duration of Psychotic Symptoms:Greater than six months  Hallucinations:Hallucinations: Auditory Description of Auditory Hallucinations: pt reported that Tyler White hears voice however Tyler White states "i don't know what the voices are saying"  Ideas of Reference:None  Suicidal Thoughts:Suicidal Thoughts: No  Homicidal Thoughts:Homicidal Thoughts: No   Sensorium  Memory:Immediate Fair; Recent Fair; [place]   Executive Functions  Concentration:Good  Attention Span:Good  [place]  Language:Good   Psychomotor Activity  Psychomotor Activity:Normal   Assets  Assets:Desire for Improvement; Physical Health; Resilience   Sleep  Number of Hours of Sleep: 4   Physical Exam Vitals and nursing note reviewed.  Constitutional:      General: Tyler White is not in acute distress.    Appearance: Normal appearance.  HENT:     Head: Normocephalic and atraumatic.  Pulmonary:     Effort: Pulmonary effort is normal.  Skin:    General: Skin is warm and dry.  Neurological:     General: No focal deficit present.     Mental Status: Tyler White is alert and oriented to person, place, and time.     Gait: Gait normal.   Review of Systems  Constitutional: Negative.   Respiratory:  Negative for shortness of breath.   Cardiovascular:  Negative for chest pain.  Gastrointestinal: Negative.   Genitourinary: Negative.   Neurological:  Negative for tremors and headaches.  Blood pressure 124/82, pulse (!) 111, temperature 98.7 F (37.1 C), resp. rate 16, height 5\' 9"  (1.753 m), weight 67.1 kg, SpO2 98 %. Body mass index is 21.86 kg/m.   Treatment Plan Summary: Eshan is a 25 year old male with a history of schizoaffective disorder-bipolar type, cocaine use disorder, and multiple past inpatient psychiatric admissions who was admitted for worsening depression and auditory  hallucinations after being released from jail.  Daily contact with Tyler White to assess and evaluate symptoms and progress in treatment and Medication management  Schizoaffective disorder- BP type -Increase Risperdal to 6 mg nightly, will switch to LAI during this admission -We will schedule Cogentin 0.5 mg twice daily for EPS and monitor AIMS - Continue room lockout during meals and groups    Cocaine Use disorder- continuous use Marijuana use disorder- in remission No withdrawal symptoms experienced at this time - Advised discontinuation of drug use.  We will offer Tyler White substance use treatment options.   Medical Management Covid negative CMP: Unremarkable CBC: unremarkable EtOH: <10 UDS: Positive cocaine TSH: 1.675 A1C: 5.0% Lipids: Unremarkable   Xerosis of skin Itching in bilateral antecubital fossae, did not appear eczematous -  Ordered Eucerin cream BID as needed.  Discharge Planning:              -- Social work and case management to assist with discharge planning and identification of hospital follow-up needs prior to discharge.              -- Tyler White curently under IVC            -- Discharge Concerns: Need to establish a safety plan; Medication compliance and effectiveness             -- Discharge Goals: To shelter or with family with outpatient referrals for mental health follow-up including medication management/psychotherapy/substance use treatment  Rosezetta Schlatter, MD PGY-1 10/13/2021 [place] Department of Psychiatry

## 2021-10-13 NOTE — [facility] Group Notes (Signed)
Adult Psychoeducational Group Note  Date:  10/13/2021 Time:  9:39 AM  Group Topic/Focus:  Goals Group:   The focus of this group is to help patients establish daily goals to achieve during treatment and discuss how the patient can incorporate goal setting into their daily lives to aide in recovery.  Participation Level:  Did Not Attend  Loney Loh 10/13/2021, 9:39 AM

## 2021-10-13 NOTE — [facility] Group Notes (Signed)
Pt did not attend Relaxation Group.

## 2021-10-13 NOTE — [facility] Group Notes (Signed)
Pt did not attend Psych-Educational Group.

## 2021-10-13 NOTE — Progress Notes (Addendum)
   10/13/21 1300  Psych Admission Type (Psych Patients Only)  Admission Status Involuntary  Psychosocial Assessment  Patient Complaints None  Eye Contact Avertive;Brief  Facial Expression Flat;Pensive  Affect Blunted;Flat  Speech Logical/coherent;Soft  Interaction Cautious;Minimal  Motor Activity Other (Comment) (WDL)  Appearance/Hygiene Unremarkable  Behavior Characteristics Cooperative;Appropriate to situation  Mood Depressed  Thought Process  Coherency Blocking  Content WDL  Delusions None reported or observed  Perception Hallucinations  Hallucination Auditory  Judgment Poor  Confusion None  Danger to Self  Current suicidal ideation? Denies  Danger to Others  Danger to Others None reported or observed  Danger to Others Abnormal  Harmful Behavior to others No threats or harm toward other people  Destructive Behavior No threats or harm toward property   D. Pt presents as flat, depressed and guarded. Pt denies SI/HI and VH, but continues to endorse hearing voices, although reports that they have decreased.  A. Labs and vitals monitored. Pt compliant with medications. Pt supported emotionally and encouraged to express concerns and ask questions.   R. Pt remains safe with 15 minute checks. Will continue POC.

## 2021-10-14 MED ORDER — RISPERIDONE 2 MG PO TBDP
6.0000 mg | ORAL_TABLET | Freq: Every day | ORAL | Status: DC
  Administered 2021-10-14 – 2021-10-17 (×4): 6 mg via ORAL
  Filled 2021-10-14 (×6): qty 3

## 2021-10-14 NOTE — [facility] Group Notes (Signed)
Pt did not attend relaxation group.  

## 2021-10-14 NOTE — Group Note (Signed)
Late Note for 10/13/2021  Clinical Social Work Note  No group could be held this day due to low staffing.  Other licensed group was held.  Selmer Dominion, LCSW 10/13/2021

## 2021-10-14 NOTE — Progress Notes (Signed)
Psychoeducational Group Note  Date:  10/14/2021 Time:  2015  Group Topic/Focus:  Wrap up group  Participation Level: Did Not Attend  Participation Quality:  Not Applicable  Affect:  Not Applicable  Cognitive:  Not Applicable  Insight:  Not Applicable  Engagement in Group: Not Applicable  Additional Comments:  Did not attend.   Shellia Cleverly 10/14/2021, 8:45 PM

## 2021-10-14 NOTE — Group Note (Signed)
Group Topic: PROGRESSIVE RELAXATION. A group where deep breathing is taught and tensing and relaxation muscle groups is used. Imagery is used as well.  Pts are asked to imagine 3 pillars that hold them up when they are not able to hold themselves up. Group Date: 10/14/2021 Start Time: 0900 End Time: 1000 Facilitators: Paulino Rily, RN  Department: [place] [facility]  Number of Participants: 16  Group Focus: anxiety and clarity of thought Treatment Modality:  Cognitive Behavioral Therapy, Skills Training, and Spiritual Interventions utilized were exploration and support Purpose: enhance coping skills and reinforce self-care  Name: Tyler White Date of Birth: [birth year]  MR: [id number]    Level of Participation: Did not attend  Patients Problems:  Patient Active Problem List   Diagnosis Date Noted   Xerosis of skin 10/09/2021   Schizoaffective disorder ([place]) 10/08/2021   Schizoaffective disorder, bipolar type ([place]) 04/26/2021   Marijuana abuse in remission 04/26/2021   Polysubstance abuse ([place]) 04/23/2021   Homelessness 04/23/2021   Schizophrenia ([place]) 11/19/2020   Cocaine abuse, continuous use ([place]) 02/19/2018   Cocaine abuse with cocaine-induced mood disorder ([place]) 02/19/2018   Psychosis ([place]) 11/27/2017   Cannabis abuse with psychotic disorder, with delusions ([place]) 11/26/2017   Schizophrenia, unspecified ([place]) 11/26/2017

## 2021-10-14 NOTE — Group Note (Signed)
  [facility] LCSW Group Therapy Note  Date/Time:  10/14/2021 10:00AM-11:00AM  Type of Therapy and Topic:  Group Therapy:  Self-Care Wheel  Participation Level:  None   Description of Group This process group involved patients discussing the importance of self-care in different areas of life (professional, personal, emotional, psychological, spiritual, and physical) in order to achieve healthy life balance.  The group talked about what self-care in each of those areas would constitute and then specifically listed how they want to provide themselves with improved self-care.  Therapeutic Goals Patient will learn how to break self-care down into various areas of life Patient will participate in generating ideas about healthy self-care options in each category Patients will be supportive of one another and receive support from others Patient will identify one healthy self-care activity to add to his/her life   Summary of Patient Progress:  The patient was late to group and did not talk at all. Patient's insight was unable to ascertain due to his silence.  Therapeutic Modalities Processing Psychoeducation   Selmer Dominion, LCSW 10/14/2021 2:49 PM

## 2021-10-14 NOTE — Progress Notes (Signed)
   10/14/21 2302  Psych Admission Type (Psych Patients Only)  Admission Status Involuntary  Psychosocial Assessment  Patient Complaints Depression  Eye Contact Brief  Facial Expression Flat  Affect Blunted  Speech Soft;Logical/coherent  Interaction Minimal  Motor Activity Other (Comment) (WDL)  Appearance/Hygiene Unremarkable  Behavior Characteristics Appropriate to situation  Mood Depressed  Thought Process  Coherency Blocking  Content WDL  Delusions None reported or observed  Perception Hallucinations  Hallucination Auditory  Judgment Poor  Confusion None  Danger to Self  Current suicidal ideation? Denies  Danger to Others  Danger to Others None reported or observed  Danger to Others Abnormal  Harmful Behavior to others No threats or harm toward other people  Destructive Behavior No threats or harm toward property

## 2021-10-14 NOTE — Progress Notes (Addendum)
[facility] MD Progress Note  10/14/2021 8:36 AM Tyler White  MRN:  [id number] Subjective:   Tyler White is a 25 year old male with a psychiatric history of schizoaffective disorder- bipolar type, cocaine use disorder-severe/dependent, alcohol use disorder-mild, and tobacco use disorder-mild who presented to [facility] ED for worsening depression, anxiety, and AH, and was admitted to [facility] for stabilization.   Per chart review, Tyler White has had multiple inpatient psychiatric admissions since 2018, mostly 2/2 substance-induced mood disorder. Tyler White says that his AH began at age 60; collateral from a previous admission stated, "Tyler White states that the patient's issues began when "he got laced" with some substance in high school. She says he has never been the same since and the rocking back and forth and hearing voices began at that time."  On Assessment Today (10/16): Case was discussed in the multidisciplinary team. MAR was reviewed and patient was compliant with medications. Patient seen, assessed, and discussed with attending Dr. Nelda Marseille. Per MAR, patient did not get dose increase in Risperdal last night.  Patient appeared to have significant thought blocking throughout assessment today.  Patient stated his mood was "better" and that he was experiencing fewer side effects from the Risperdal.  Patient appeared guarded during assessment and initially stated he was not experiencing AVH but later endorsed it stating that they were happening prior to answering questions during assessment.  Patient unable to verbalize specifically what kind of hallucinations he is experiencing.  Patient was sleeping on the floor yesterday was unable to verbalize why this was.  Patient also endorses continued thought broadcasting and telepathic powers that he "gained when I was born."  Discussed with patient potential to switch antipsychotics.  Patient states that "I have always been on Risperdal".  Patient perseverates on discharge but  counseled patient that he is under IVC and will require additional improvements prior to discharge.  Patient was alert and oriented to person, time, and place but stated that the president currently was Trump.  Patient denies present SI/HI.  Principal Problem: Schizoaffective disorder, bipolar type ([place]) Diagnosis: Principal Problem:   Schizoaffective disorder, bipolar type ([place]) Active Problems:   Cocaine abuse, continuous use ([facility])   Homelessness   Marijuana abuse in remission   Xerosis of skin  Total Time spent with patient: I personally spent 35 minutes on the unit in direct patient care. The direct patient care time included face-to-face time with the patient, reviewing the patient's chart, communicating with other professionals, and coordinating care. Greater than 50% of this time was spent in counseling or coordinating care with the patient regarding goals of hospitalization, psycho-education, and discharge planning needs.   Past Psychiatric History: See H&P  Past Medical History:  Past Medical History:  Diagnosis Date   Hernia, inguinal, right    Inguinal hernia    right   Psychiatric illness    Schizophrenia ([place])    History reviewed. No pertinent surgical history. Family History:  Family History  Problem Relation Age of Onset   Psychiatric Illness Mother    Hypertension Sister    Family Psychiatric  History: See H&P Social History:  Social History   Substance and Sexual Activity  Alcohol Use Not Currently     Social History   Substance and Sexual Activity  Drug Use Not Currently   Types: Marijuana, Cocaine   Comment: denies currently    Social History   Socioeconomic History   Marital status: Single    Spouse name: Not on file   Number of children: Not  on file   Years of education: 10   Highest education level: Not on file  Occupational History   Not on file  Tobacco Use   Smoking status: Former    Packs/day: 0.50    Years: 5.00    Pack years:  2.50    Types: Cigarettes   Smokeless tobacco: Never  Vaping Use   Vaping Use: Never used  Substance and Sexual Activity   Alcohol use: Not Currently   Drug use: Not Currently    Types: Marijuana, Cocaine    Comment: denies currently   Sexual activity: Yes    Birth control/protection: None  Other Topics Concern   Not on file  Social History Narrative   ** Merged History Encounter **       ** Merged History Encounter **       Social Determinants of Health   Financial Resource Strain: Not on file  Food Insecurity: Not on file  Transportation Needs: Not on file  Physical Activity: Not on file  Stress: Not on file  Social Connections: Not on file   Additional Social History:      Sleep: Poor overnight  Appetite:  Good  Current Medications: Current Facility-Administered Medications  Medication Dose Route Frequency Provider Last Rate Last Admin   acetaminophen (TYLENOL) tablet 650 mg  650 mg Oral Q6H PRN Starkes-Perry, Gayland Curry, FNP       alum & mag hydroxide-simeth (MAALOX/MYLANTA) 200-200-20 MG/5ML suspension 30 mL  30 mL Oral Q4H PRN Starkes-Perry, Gayland Curry, FNP       benztropine (COGENTIN) tablet 0.5 mg  0.5 mg Oral BID Rosezetta Schlatter, MD   0.5 mg at 10/13/21 1905   hydrocerin (EUCERIN) cream   Topical PRN Rosezetta Schlatter, MD       hydrOXYzine (ATARAX/VISTARIL) tablet 25 mg  25 mg Oral TID PRN Rosezetta Schlatter, MD   25 mg at 10/13/21 2136   magnesium hydroxide (MILK OF MAGNESIA) suspension 30 mL  30 mL Oral Daily PRN Suella Broad, FNP       neomycin-bacitracin-polymyxin (NEOSPORIN) ointment packet   Topical BID Janine Limbo, MD   Given at 10/13/21 1907   risperiDONE (RISPERDAL M-TABS) disintegrating tablet 5 mg  5 mg Oral QHS Rosezetta Schlatter, MD   5 mg at 10/13/21 2136   traZODone (DESYREL) tablet 100 mg  100 mg Oral QHS PRN Suella Broad, FNP   100 mg at 10/14/21 0106    Lab Results:  No results found for this or any previous visit (from the  past 50 hour(s)).   Blood Alcohol level:  Lab Results  Component Value Date   ETH <10 10/07/2021   ETH <10 09/73/5329    Metabolic Disorder Labs: Lab Results  Component Value Date   HGBA1C 5.0 10/09/2021   MPG 96.8 10/09/2021   MPG 99.67 11/02/2020   Lab Results  Component Value Date   PROLACTIN 32.5 (H) 05/30/2018   Lab Results  Component Value Date   CHOL 172 10/09/2021   TRIG 131 10/09/2021   HDL 50 10/09/2021   CHOLHDL 3.4 10/09/2021   VLDL 26 10/09/2021   LDLCALC 96 10/09/2021   LDLCALC 68 11/02/2020    Physical Findings: AIMS: Facial and Oral Movements Muscles of Facial Expression: None, normal Lips and Perioral Area: None, normal Jaw: None, normal Tongue: None, normal,Extremity Movements Upper (arms, wrists, hands, fingers): None, normal Lower (legs, knees, ankles, toes): None, normal, Trunk Movements Neck, shoulders, hips: None, normal, Overall Severity Severity of abnormal  movements (highest score from questions above): None, normal Incapacitation due to abnormal movements: None, normal Patient's awareness of abnormal movements (rate only patient's report): No Awareness, Dental Status Current problems with teeth and/or dentures?: No Does patient usually wear dentures?: No  CIWA:  CIWA-Ar Total: 1  Musculoskeletal: Strength & Muscle Tone: within normal limits Gait & Station: normal, steady Patient leans: N/A  Psychiatric Specialty Exam:  Presentation  General Appearance: Appropriate for Environment; Casual  Eye Contact:Fleeting  Speech:Mumbling quality at times; periods of speech latency  Speech Volume:Decreased  Handedness:Right   Mood and Affect  Mood: appears aloof   Affect:constricted, guarded   Thought Process  Thought Processes:Concrete, vague, evasive  Orientation: oriented to self, year, and month but not President  Thought Content: Continues to endorse ongoing auditory hallucinations that are non-command in nature as well  as thought broadcasting; denies SI/HI/VH, paranoia, or ideas of reference.  Endorsed delusional belief in telepathy today. Has periods of speech latency and thought blocking suggestive of internal preoccupation.  History of Schizophrenia/Schizoaffective disorder:Yes  Duration of Psychotic Symptoms:Greater than six months  Hallucinations:AH - noncommand in nature  Ideas of Reference:None  Suicidal Thoughts:Denied  Homicidal Thoughts:Denied   Sensorium  Memory: Fair  Judgment:Impaired  Insight:Shallow   Executive Functions  Concentration:Fair  Attention Span:Fair  [place]   Psychomotor Activity  Psychomotor Activity:Normal   Assets  Assets:Desire for Improvement; Physical Health; Resilience   Sleep  Number of Hours of Sleep: 2    Physical Exam Vitals and nursing note reviewed.  Constitutional:      General: He is not in acute distress.    Appearance: Normal appearance.  HENT:     Head: Normocephalic and atraumatic.  Pulmonary:     Effort: Pulmonary effort is normal.  Skin:    General: Skin is warm and dry.  Neurological:     General: No focal deficit present.     Mental Status: He is alert.     Gait: Gait normal.   Review of Systems  Constitutional: Negative.   Respiratory:  Negative for shortness of breath.   Cardiovascular:  Negative for chest pain.  Gastrointestinal:  Negative for constipation, diarrhea and nausea.  Neurological:  Negative for tremors and headaches.  Blood pressure 126/89, pulse (!) 110, temperature 98.7 F (37.1 C), resp. rate 16, height 5\' 9"  (1.753 m), weight 67.1 kg, SpO2 98 %. Body mass index is 21.86 kg/m.   Treatment Plan Summary: Tyler White is a 25 year old male with a history of schizoaffective disorder-bipolar type, cocaine use disorder, and multiple past inpatient psychiatric admissions who was admitted for worsening depression and auditory hallucinations after being  released from jail.  Patient continues to experience significant thought blocking, thought broadcasting, hallucinations - titrating up on Risperdal tonight.  May need to switch antipsychotics or consider transition to a typical agent if he does continue to improve.   Daily contact with patient to assess and evaluate symptoms and progress in treatment and Medication management  Schizoaffective disorder- BP type -Increase Risperdal to 6 mg nightly tonight, will goal to transition to LAI during this admission -Continue Cogentin 0.5 mg twice daily for EPS and monitor AIMS - Continue room lockout during meals and groups  - Has Trazodone PRN for insomnia and encouraged improved sleep hygiene during the day - Recheck EKG for QTC monitoring on present Risperdal dose   Cocaine Use disorder- continuous use Marijuana use disorder- in remission No withdrawal symptoms experienced at this time - Advised  discontinuation of drug use.  We will offer patient substance use treatment options.   Medical Management Covid negative CMP: Unremarkable CBC: unremarkable EtOH: <10 UDS: Positive cocaine TSH: 1.675 A1C: 5.0% Lipids: Unremarkable   Xerosis of skin Itching in bilateral antecubital fossae, did not appear eczematous -Ordered Eucerin cream BID as needed.  Discharge Planning:              -- Social work and case management to assist with discharge planning and identification of hospital follow-up needs prior to discharge.              -- Patient curently under IVC            -- Discharge Concerns: Need to establish a safety plan; Medication compliance and effectiveness             -- Discharge Goals: To shelter or with family with outpatient referrals for mental health follow-up including medication management/psychotherapy/substance use treatment  France Ravens, MD PGY-1 10/14/2021 [facility] Department of Psychiatry

## 2021-10-14 NOTE — Progress Notes (Signed)
EKG results placed on the outside of pt's shadow chart   Normal sinus rhythm Normal ECG  QT/Qtc-Baz  352/435 ms

## 2021-10-14 NOTE — [facility] Group Notes (Signed)
Pt did not attend the morning group with the nurse.

## 2021-10-14 NOTE — Progress Notes (Signed)
   10/14/21 1500  Psych Admission Type (Psych Patients Only)  Admission Status Involuntary  Psychosocial Assessment  Patient Complaints Depression  Eye Contact Brief  Facial Expression Flat  Affect Blunted  Speech Logical/coherent  Interaction Minimal  Motor Activity Other (Comment) (WDL)  Appearance/Hygiene Unremarkable  Behavior Characteristics Cooperative;Appropriate to situation  Mood Depressed  Thought Process  Coherency Blocking  Content WDL  Delusions None reported or observed  Perception Hallucinations  Hallucination Auditory  Judgment Poor  Confusion None  Danger to Self  Current suicidal ideation? Denies  Danger to Others  Danger to Others None reported or observed  Danger to Others Abnormal  Harmful Behavior to others No threats or harm toward other people  Destructive Behavior No threats or harm toward property

## 2021-10-15 ENCOUNTER — Encounter (HOSPITAL_COMMUNITY): Payer: Self-pay

## 2021-10-15 DIAGNOSIS — F1490 Cocaine use, unspecified, uncomplicated: Secondary | ICD-10-CM

## 2021-10-15 DIAGNOSIS — L853 Xerosis cutis: Secondary | ICD-10-CM

## 2021-10-15 DIAGNOSIS — F1291 Cannabis use, unspecified, in remission: Secondary | ICD-10-CM

## 2021-10-15 MED ORDER — ARIPIPRAZOLE 5 MG PO TABS
5.0000 mg | ORAL_TABLET | Freq: Once | ORAL | Status: AC
  Administered 2021-10-15: 5 mg via ORAL
  Filled 2021-10-15 (×2): qty 1

## 2021-10-15 MED ORDER — ARIPIPRAZOLE 10 MG PO TABS
10.0000 mg | ORAL_TABLET | Freq: Every day | ORAL | Status: DC
  Administered 2021-10-16 – 2021-10-23 (×8): 10 mg via ORAL
  Filled 2021-10-15 (×5): qty 1
  Filled 2021-10-15: qty 7
  Filled 2021-10-15 (×4): qty 1
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
## 2021-10-15 NOTE — [facility] Group Notes (Signed)
Pt was able to attend AA/NA group. Pt was appropriate during tonight's group discussion wrap up group.

## 2021-10-15 NOTE — [facility] Group Notes (Signed)
Adult Psychoeducational Group Note  Date:  10/15/2021 Time:  4:33 PM  Group Topic/Focus:  Healthy Communication:   The focus of this group is to discuss communication, barriers to communication, as well as healthy ways to communicate with others.  Participation Level:  Minimal  Participation Quality:  Appropriate  Affect:  Appropriate  Cognitive:  Appropriate  Insight: Appropriate  Engagement in Group:  Improving  Modes of Intervention:  Discussion  Additional Comments:  Patient attended and participated in the Psycho-Ed group.  Tyler White 10/15/2021, 4:33 PM

## 2021-10-15 NOTE — Group Note (Signed)
LCSW Group Therapy Note   Group Date: 10/15/2021 Start Time: 1300 End Time: 1400   Type of Therapy and Topic:  Group Therapy:   Participation Level:  Did Not Attend  Patients received a worksheet with an outline of 2 gingerbread men with a separation in the middle of the page. One sign designated what the pt sees about themselves and the other is what others see. Pts were asked to introduce themselves and share something they like about themself. Pts were then asked to draw, write or color how they view themselves as well as how they are viewed by others. CSW led discussion about the feelings and words associated with each side.   Patient Summary:   Did not attend   Tyler White, [place] 10/15/2021  1:49 PM

## 2021-10-15 NOTE — Progress Notes (Addendum)
   10/15/21 1400  Psych Admission Type (Psych Patients Only)  Admission Status Involuntary  Psychosocial Assessment  Patient Complaints None  Eye Contact Brief  Facial Expression Flat  Affect Blunted  Speech Soft;Logical/coherent  Interaction Minimal  Motor Activity Other (Comment) (WDL)  Appearance/Hygiene Unremarkable  Behavior Characteristics Cooperative;Appropriate to situation  Mood Depressed  Thought Process  Coherency Blocking  Content WDL  Delusions None reported or observed  Perception Hallucinations  Hallucination Auditory  Judgment Poor  Confusion None  Danger to Self  Current suicidal ideation? Denies  Danger to Others  Danger to Others None reported or observed  Danger to Others Abnormal  Harmful Behavior to others No threats or harm toward other people  Destructive Behavior No threats or harm toward property   D. Pt continues to present with a flat affect, depressed mood, but has been less isolative and guarded, as he has been attending groups. Per pt's self inventory, pt rated his depression, hopelessness and anxiety all 0's today. Pt reported that his goal today was to "go to groups".  Pt currently denies SI/HI and reports that his AH have decreased. A. Labs and vitals monitored. Pt given and educated on medications. Pt supported emotionally and encouraged to express concerns and ask questions.   R. Pt remains safe with 15 minute checks. Will continue POC.

## 2021-10-15 NOTE — Group Note (Signed)
Occupational Therapy Group Note  Group Topic:Communication  Group Date: 10/15/2021 Start Time: 1400 End Time: 1445 Facilitators: Ponciano Ort, OT/L   Group Description: Group encouraged increased engagement and participation through discussion focused on communication styles. Patients were educated on the different styles of communication including passive, aggressive, assertive, and passive-aggressive communication. Group members shared and reflected on which styles they most often find themselves communicating in and brainstormed strategies on how to transition and practice a more assertive approach. Further discussion explored how to use assertiveness skills and strategies to further advocate and ask questions as it relates to their treatment plan and mental health.   Therapeutic Goal(s): Identify practical strategies to improve communication skills  Identify how to use assertive communication skills to address individual needs and wants   Participation Level: OT Group not held d/t high volume of individual OT/PT orders that needed to be seen.    Plan: Continue to engage patient in OT groups 2 - 3x/week.  10/15/2021  Ponciano Ort, OT/L

## 2021-10-15 NOTE — Group Note (Signed)
Recreation Therapy Group Note   Group Topic:Stress Management  Group Date: 10/15/2021 Start Time: 0935 End Time: 0950 Facilitators: Victorino Sparrow, LRT/CTRS Location: [address] Dayroom  Goal Area(s) Addresses:  Patient will actively participate in stress management techniques presented during session.  Patient will successfully identify benefit of practicing stress management post d/c.   Group Description: Guided Imagery. LRT provided education, instruction, and demonstration on practice of visualization via guided imagery. Patient was asked to participate in the technique introduced during session. LRT debriefed including topics of mindfulness, stress management and specific scenarios each patient could use these techniques. Patients were given suggestions of ways to access scripts post d/c and encouraged to explore Youtube and other apps available on smartphones, tablets, and computers.   Affect/Mood: N/A   Participation Level: Did not attend    Clinical Observations/Individualized Feedback: Pt did not attend group.    Plan: Continue to engage patient in RT group sessions 2-3x/week.   Victorino Sparrow, LRT/CTRS 10/15/2021 12:45 PM

## 2021-10-15 NOTE — Progress Notes (Signed)
D:  Tyler White reported his day as good.  He was pleasant and cooperative.  He attended groups but is minimal with peer interaction.  He denied SI/HI or AVH.  He appeared to be responding to internal stimuli.  He denied any pain or discomfort and appeared to be in no physical distress A:  1:1 with RN for support and encouragement.  Medications given as ordered.  Q 15 minute checks maintained for safety.  Encouraged participation in group and unit activities.   R:  Tyler White remains safe on the unit. He is currently resting with his eyes closed and appears to be asleep.We will continue to monitor the progress towards his goals.      10/15/21 2135  Psych Admission Type (Psych Patients Only)  Admission Status Involuntary  Psychosocial Assessment  Patient Complaints None  Eye Contact Brief  Facial Expression Flat  Affect Blunted  Speech Soft;Logical/coherent  Interaction Minimal  Motor Activity Other (Comment)  Appearance/Hygiene Unremarkable  Behavior Characteristics Cooperative  Mood Euthymic  Thought Process  Coherency Blocking  Content WDL  Delusions None reported or observed  Perception Hallucinations  Hallucination Auditory  Judgment Poor  Confusion None  Danger to Self  Current suicidal ideation? Denies  Danger to Others  Danger to Others None reported or observed

## 2021-10-15 NOTE — BH IP Treatment Plan (Signed)
Interdisciplinary Treatment and Diagnostic Plan Update  10/15/2021 Time of Session: 2:00pm Tyler White MRN: [id number]  Principal Diagnosis: Schizoaffective disorder, bipolar type ([place])  Secondary Diagnoses: Principal Problem:   Schizoaffective disorder, bipolar type ([place]) Active Problems:   Cocaine abuse, continuous use ([place])   Homelessness   Marijuana abuse in remission   Xerosis of skin   Current Medications:  Current Facility-Administered Medications  Medication Dose Route Frequency Provider Last Rate Last Admin   acetaminophen (TYLENOL) tablet 650 mg  650 mg Oral Q6H PRN Starkes-Perry, Gayland Curry, FNP       alum & mag hydroxide-simeth (MAALOX/MYLANTA) 200-200-20 MG/5ML suspension 30 mL  30 mL Oral Q4H PRN Starkes-Perry, Gayland Curry, FNP       [START ON 10/16/2021] ARIPiprazole (ABILIFY) tablet 10 mg  10 mg Oral Daily Cosby, Loma Sousa, MD       benztropine (COGENTIN) tablet 0.5 mg  0.5 mg Oral BID Rosezetta Schlatter, MD   0.5 mg at 10/15/21 0831   hydrocerin (EUCERIN) cream   Topical PRN Rosezetta Schlatter, MD       hydrOXYzine (ATARAX/VISTARIL) tablet 25 mg  25 mg Oral TID PRN Rosezetta Schlatter, MD   25 mg at 10/13/21 2136   magnesium hydroxide (MILK OF MAGNESIA) suspension 30 mL  30 mL Oral Daily PRN Suella Broad, FNP       neomycin-bacitracin-polymyxin (NEOSPORIN) ointment packet   Topical BID Massengill, Ovid Curd, MD   Given at 10/15/21 1022   risperiDONE (RISPERDAL M-TABS) disintegrating tablet 6 mg  6 mg Oral QHS Nelda Marseille, Amy E, MD   6 mg at 10/14/21 2128   traZODone (DESYREL) tablet 100 mg  100 mg Oral QHS PRN Suella Broad, FNP   100 mg at 10/14/21 2128   PTA Medications: Facility-Administered Medications Prior to Admission  Medication Dose Route Frequency Provider Last Rate Last Admin   paliperidone (INVEGA SUSTENNA) injection 156 mg  156 mg Intramuscular Q28 days Money, [place] B, FNP       No medications prior to admission.    Patient Stressors: Financial  difficulties   Medication change or noncompliance   Occupational concerns   Traumatic event    Patient Strengths: Motivation for treatment/growth  Physical Health   Treatment Modalities: Medication Management, Group therapy, Case management,  1 to 1 session with clinician, Psychoeducation, Recreational therapy.   Physician Treatment Plan for Primary Diagnosis: Schizoaffective disorder, bipolar type ([place]) Long Term Goal(s): Improvement in symptoms so as ready for discharge   Short Term Goals: Ability to identify changes in lifestyle to reduce recurrence of condition will improve Ability to verbalize feelings will improve Ability to disclose and discuss suicidal ideas Ability to demonstrate self-control will improve Compliance with prescribed medications will improve Ability to identify triggers associated with substance abuse/mental health issues will improve  Medication Management: Evaluate patient's response, side effects, and tolerance of medication regimen.  Therapeutic Interventions: 1 to 1 sessions, Unit Group sessions and Medication administration.  Evaluation of Outcomes: Not Progressing  Physician Treatment Plan for Secondary Diagnosis: Principal Problem:   Schizoaffective disorder, bipolar type ([place]) Active Problems:   Cocaine abuse, continuous use ([place])   Homelessness   Marijuana abuse in remission   Xerosis of skin  Long Term Goal(s): Improvement in symptoms so as ready for discharge   Short Term Goals: Ability to identify changes in lifestyle to reduce recurrence of condition will improve Ability to verbalize feelings will improve Ability to disclose and discuss suicidal ideas Ability to demonstrate self-control will improve Compliance  with prescribed medications will improve Ability to identify triggers associated with substance abuse/mental health issues will improve     Medication Management: Evaluate patient's response, side effects, and tolerance of  medication regimen.  Therapeutic Interventions: 1 to 1 sessions, Unit Group sessions and Medication administration.  Evaluation of Outcomes: Not Progressing   RN Treatment Plan for Primary Diagnosis: Schizoaffective disorder, bipolar type ([place]) Long Term Goal(s): Knowledge of disease and therapeutic regimen to maintain health will improve  Short Term Goals: Ability to remain free from injury will improve, Ability to participate in decision making will improve, Ability to verbalize feelings will improve, Ability to disclose and discuss suicidal ideas, and Compliance with prescribed medications will improve  Medication Management: RN will administer medications as ordered by provider, will assess and evaluate patient's response and provide education to patient for prescribed medication. RN will report any adverse and/or side effects to prescribing provider.  Therapeutic Interventions: 1 on 1 counseling sessions, Psychoeducation, Medication administration, Evaluate responses to treatment, Monitor vital signs and CBGs as ordered, Perform/monitor CIWA, COWS, AIMS and Fall Risk screenings as ordered, Perform wound care treatments as ordered.  Evaluation of Outcomes: Not Progressing   LCSW Treatment Plan for Primary Diagnosis: Schizoaffective disorder, bipolar type ([place]) Long Term Goal(s): Safe transition to appropriate next level of care at discharge, Engage patient in therapeutic group addressing interpersonal concerns.  Short Term Goals: Engage patient in aftercare planning with referrals and resources, Increase social support, Increase ability to appropriately verbalize feelings, Increase emotional regulation, Facilitate acceptance of mental health diagnosis and concerns, and Increase skills for wellness and recovery  Therapeutic Interventions: Assess for all discharge needs, 1 to 1 time with Social worker, Explore available resources and support systems, Assess for adequacy in community support  network, Educate family and significant other(s) on suicide prevention, Complete Psychosocial Assessment, Interpersonal group therapy.  Evaluation of Outcomes: Not Progressing   Progress in Treatment: Attending groups: No. Participating in groups: No. Taking medication as prescribed: Yes. Toleration medication: Yes. Family/Significant other contact made: No, will contact:  attempted to reach pt's contact Patient understands diagnosis: Yes. Discussing patient identified problems/goals with staff: Yes. Medical problems stabilized or resolved: Yes. Denies suicidal/homicidal ideation: Yes. Issues/concerns per patient self-inventory: No.   New problem(s) identified: No, Describe:  none  New Short Term/Long Term Goal(s): detox, medication management for mood stabilization; elimination of SI thoughts; development of comprehensive mental wellness/sobriety plan  Patient Goals:  "To go to a shelter"  Discharge Plan or Barriers: Pt is to follow up with [facility] for outpatient therapy and medication management.  Reason for Continuation of Hospitalization: Delusions  Depression Hallucinations Medication stabilization  Estimated Length of Stay: 3-5 days   Scribe for Treatment Team: Vassie Moselle, LCSW 10/15/2021 2:28 PM

## 2021-10-16 MED ORDER — PROPRANOLOL HCL 10 MG PO TABS
10.0000 mg | ORAL_TABLET | Freq: Two times a day (BID) | ORAL | Status: DC
  Administered 2021-10-16 – 2021-10-17 (×2): 10 mg via ORAL
  Filled 2021-10-16 (×6): qty 1

## 2021-10-16 NOTE — Group Note (Signed)
Recreation Therapy Group Note   Group Topic:Animal Assisted Therapy   Group Date: 10/16/2021 Start Time: 1430 End Time: 1510 Facilitators: Victorino Sparrow, LRT/CTRS Location: [place]  AAA/T Program Assumption of Risk Form signed by Patient/ or Parent Legal Guardian Yes  Patient understands his/her participation is voluntary Yes   Affect/Mood: N/A   Participation Level: Did not attend    Clinical Observations/Individualized Feedback: Pt did not attend group.    Plan: Continue to engage patient in RT group sessions 2-3x/week.   Victorino Sparrow, LRT/CTRS 10/16/2021 4:00 PM

## 2021-10-16 NOTE — Plan of Care (Signed)
Nurse discussed coping skills with patient.  

## 2021-10-16 NOTE — [facility] Group Notes (Signed)
The focus of this group is to help patients establish daily goals to achieve during treatment and discuss how the patient can incorporate goal setting into their daily lives to aide in recovery.  Pt did not attend morning goals group.

## 2021-10-16 NOTE — Progress Notes (Signed)
   10/16/21 2115  Psych Admission Type (Psych Patients Only)  Admission Status Involuntary  Psychosocial Assessment  Patient Complaints None  Eye Contact Brief  Facial Expression Flat  Affect Blunted  Speech Soft;Logical/coherent  Interaction Minimal  Motor Activity Other (Comment) (wnl)  Appearance/Hygiene Unremarkable  Behavior Characteristics Cooperative  Mood Preoccupied  Thought Process  Coherency Blocking  Content WDL  Delusions None reported or observed  Perception Hallucinations  Hallucination None reported or observed  Judgment Poor  Confusion None  Danger to Self  Current suicidal ideation? Denies  Danger to Others  Danger to Others None reported or observed   Pt seen at med window. Pt denies SI, HI, AVH and pain. Denies anxiety and depression. Pt minimal. Delayed responses to questions. No behavior issues noted. Pt takes meds as prescribed.

## 2021-10-16 NOTE — Progress Notes (Signed)
[facility] MD Progress Note  10/16/2021 6:55 AM Tyler White  MRN:  [id number] Subjective:   Tyler White is a 25 year old male with a psychiatric history of schizoaffective disorder- bipolar type, cocaine use disorder-severe/dependent, alcohol use disorder-mild, and tobacco use disorder-mild who presented to [facility] ED for worsening depression, anxiety, and AH, and was admitted to [facility] for stabilization.   Per chart review, Tyler White has had multiple inpatient psychiatric admissions since 2018, mostly 2/2 substance-induced mood disorder. Tyler White says that his AH began at age 49; collateral from a previous admission stated, "Tyler White states that the patient's issues began when "he got laced" with some substance in high school. She says he has never been the same since and the rocking back and forth and hearing voices began at that time."  On Assessment Today (10/18): Case was discussed in the multidisciplinary team. MAR was reviewed and patient was compliant with medications. Patient seen, assessed, and discussed with attending Dr. Caswell Corwin.  Patient appeared to have even less thought blocking throughout assessment today.  Patient stated today he is "well rested," and his appetite is intact.  Patient appeared less guarded overall, stating that he has been attending groups, and that he has the goal to work toward being a good dad.  Patient denies present SI/HI/AVH, and notes an improvement in AH.  He endorses paranoid ideation toward a male nurse who was "watching me while I slept this morning."  He has tolerated Abilify without issue.   Principal Problem: Schizoaffective disorder, bipolar type ([place]) Diagnosis: Principal Problem:   Schizoaffective disorder, bipolar type ([place]) Active Problems:   Cocaine abuse, continuous use ([facility])   Homelessness   Marijuana abuse in remission   Xerosis of skin  Total Time spent with patient: I personally spent 35 minutes on the unit in direct patient care. The direct patient  care time included face-to-face time with the patient, reviewing the patient's chart, communicating with other professionals, and coordinating care. Greater than 50% of this time was spent in counseling or coordinating care with the patient regarding goals of hospitalization, psycho-education, and discharge planning needs.   Past Psychiatric History: See H&P  Past Medical History:  Past Medical History:  Diagnosis Date   Hernia, inguinal, right    Inguinal hernia    right   Psychiatric illness    Schizophrenia ([place])    History reviewed. No pertinent surgical history. Family History:  Family History  Problem Relation Age of Onset   Psychiatric Illness Mother    Hypertension Sister    Family Psychiatric  History: See H&P Social History:  Social History   Substance and Sexual Activity  Alcohol Use Not Currently     Social History   Substance and Sexual Activity  Drug Use Not Currently   Types: Marijuana, Cocaine   Comment: denies currently    Social History   Socioeconomic History   Marital status: Single    Spouse name: Not on file   Number of children: Not on file   Years of education: 10   Highest education level: Not on file  Occupational History   Not on file  Tobacco Use   Smoking status: Former    Packs/day: 0.50    Years: 5.00    Pack years: 2.50    Types: Cigarettes   Smokeless tobacco: Never  Vaping Use   Vaping Use: Never used  Substance and Sexual Activity   Alcohol use: Not Currently   Drug use: Not Currently    Types: Marijuana, Cocaine  Comment: denies currently   Sexual activity: Yes    Birth control/protection: None  Other Topics Concern   Not on file  Social History Narrative   ** Merged History Encounter **       ** Merged History Encounter **       Social Determinants of Health   Financial Resource Strain: Not on file  Food Insecurity: Not on file  Transportation Needs: Not on file  Physical Activity: Not on file  Stress: Not  on file  Social Connections: Not on file   Additional Social History:      Sleep: Good  Appetite:  Good  Current Medications: Current Facility-Administered Medications  Medication Dose Route Frequency Provider Last Rate Last Admin   acetaminophen (TYLENOL) tablet 650 mg  650 mg Oral Q6H PRN Starkes-Perry, Gayland Curry, FNP       alum & mag hydroxide-simeth (MAALOX/MYLANTA) 200-200-20 MG/5ML suspension 30 mL  30 mL Oral Q4H PRN Starkes-Perry, Gayland Curry, FNP       ARIPiprazole (ABILIFY) tablet 10 mg  10 mg Oral Daily Neftali Abair, Loma Sousa, MD       benztropine (COGENTIN) tablet 0.5 mg  0.5 mg Oral BID Rosezetta Schlatter, MD   0.5 mg at 10/15/21 1640   hydrocerin (EUCERIN) cream   Topical PRN Rosezetta Schlatter, MD       hydrOXYzine (ATARAX/VISTARIL) tablet 25 mg  25 mg Oral TID PRN Rosezetta Schlatter, MD   25 mg at 10/15/21 1640   magnesium hydroxide (MILK OF MAGNESIA) suspension 30 mL  30 mL Oral Daily PRN Starkes-Perry, Gayland Curry, FNP       neomycin-bacitracin-polymyxin (NEOSPORIN) ointment packet   Topical BID Massengill, Ovid Curd, MD   Given at 10/15/21 1022   risperiDONE (RISPERDAL M-TABS) disintegrating tablet 6 mg  6 mg Oral QHS Nelda Marseille, Amy E, MD   6 mg at 10/15/21 2135   traZODone (DESYREL) tablet 100 mg  100 mg Oral QHS PRN Suella Broad, FNP   100 mg at 10/15/21 2136    Lab Results:  No results found for this or any previous visit (from the past 48 hour(s)).   Blood Alcohol level:  Lab Results  Component Value Date   ETH <10 10/07/2021   ETH <10 11/94/1740    Metabolic Disorder Labs: Lab Results  Component Value Date   HGBA1C 5.0 10/09/2021   MPG 96.8 10/09/2021   MPG 99.67 11/02/2020   Lab Results  Component Value Date   PROLACTIN 32.5 (H) 05/30/2018   Lab Results  Component Value Date   CHOL 172 10/09/2021   TRIG 131 10/09/2021   HDL 50 10/09/2021   CHOLHDL 3.4 10/09/2021   VLDL 26 10/09/2021   LDLCALC 96 10/09/2021   LDLCALC 68 11/02/2020    Physical  Findings: AIMS: Facial and Oral Movements Muscles of Facial Expression: None, normal Lips and Perioral Area: None, normal Jaw: None, normal Tongue: None, normal,Extremity Movements Upper (arms, wrists, hands, fingers): None, normal Lower (legs, knees, ankles, toes): None, normal, Trunk Movements Neck, shoulders, hips: None, normal, Overall Severity Severity of abnormal movements (highest score from questions above): None, normal Incapacitation due to abnormal movements: None, normal Patient's awareness of abnormal movements (rate only patient's report): No Awareness, Dental Status Current problems with teeth and/or dentures?: No Does patient usually wear dentures?: No  CIWA:  CIWA-Ar Total: 1  Musculoskeletal: Strength & Muscle Tone: within normal limits Gait & Station: normal, steady Patient leans: N/A  Psychiatric Specialty Exam:  Presentation  General Appearance: Appropriate  for Environment; Casual  Eye Contact:Fleeting  Speech: Mostly clear; less periods of speech latency  Speech Volume:Normal  Handedness:Right   Mood and Affect  Mood: "I am well rested."  Affect:appears less guarded, pleasant   Thought Process  Thought Processes:Concrete, vague, but improving  Orientation: oriented to self, year, and month  Thought Content: Continues to endorse ongoing non-command AH but decreased in volume; denies SI/HI/VH, thought broadcasting/control, or ideas of reference.  Made no delusional statements today but endorsed paranoid ideation about male nurse.  Has less periods of speech latency and thought blocking suggestive of internal preoccupation.  History of Schizophrenia/Schizoaffective disorder:Yes  Duration of Psychotic Symptoms:Greater than six months  Hallucinations:AH - noncommand in nature  Ideas of Reference:Paranoia  Suicidal Thoughts:Denied  Homicidal Thoughts:Denied   Sensorium  Memory:  [place]  Insight:Shallow Improving  Executive Functions  Concentration:Fair  Attention Span:Good  Foot of Ten   Psychomotor Activity  Psychomotor Activity:Normal   Assets  Assets:Desire for Improvement; Physical Health; Resilience   Sleep  Number of Hours of Sleep: 6   Physical Exam Vitals and nursing note reviewed.  Constitutional:      General: He is not in acute distress.    Appearance: Normal appearance.  HENT:     Head: Normocephalic and atraumatic.  Pulmonary:     Effort: Pulmonary effort is normal.  Skin:    General: Skin is warm and dry.     Findings: Lesion present.     Comments: Lesion on forehead healing appropriately  Neurological:     General: No focal deficit present.     Mental Status: He is alert.     Gait: Gait normal.   Review of Systems  Constitutional: Negative.   Respiratory:  Negative for shortness of breath.   Cardiovascular:  Negative for chest pain.  Gastrointestinal:  Negative for abdominal pain, constipation, diarrhea and nausea.  Genitourinary: Negative.   Neurological:  Negative for tremors and headaches.  Blood pressure 131/89, pulse 85, temperature 98.7 F (37.1 C), resp. rate 16, height 5\' 9"  (1.753 m), weight 67.1 kg, SpO2 98 %. Body mass index is 21.86 kg/m.   Treatment Plan Summary: Kieth is a 25 year old male with a history of schizoaffective disorder-bipolar type, cocaine use disorder, and multiple past inpatient psychiatric admissions who was admitted for worsening depression and auditory hallucinations after being released from jail.  Patient continues to experience some thought blocking (improving), thought broadcasting, hallucinations - adding Abilify as an adjunct.  May need to switch antipsychotics or consider transition to a typical agent if he does continue to improve.   Daily contact with patient to assess and evaluate symptoms and progress in  treatment and Medication management  Schizoaffective disorder- BP type -Continue Risperdal 6 mg nightly, will goal to transition to LAI during this admission -Continue Cogentin 0.5 mg twice daily for EPS and monitor AIMS -Initiate Abilify 10 mg today (r/b/se/a to medication reviewed and he consents to med trial). - Continue room lockout during meals and groups  - Has Trazodone PRN for insomnia and encouraged improved sleep hygiene during the day - EKG QTC 435 - monitoring on present Risperdal dose   Cocaine Use disorder- continuous use Marijuana use disorder- in remission No withdrawal symptoms experienced at this time - Advised discontinuation of drug use.  We will offer patient substance use treatment options.   Medical Management Covid negative CMP: Unremarkable CBC: unremarkable EtOH: <10 UDS: Positive cocaine TSH: 1.675 A1C: 5.0% Lipids: Unremarkable  Xerosis of skin Itching in bilateral antecubital fossae, did not appear eczematous -Ordered Eucerin cream BID as needed.  Discharge Planning:              -- Social work and case management to assist with discharge planning and identification of hospital follow-up needs prior to discharge.              -- Patient curently under IVC            -- Discharge Concerns: Need to establish a safety plan; Medication compliance and effectiveness             -- Discharge Goals: To shelter or with family with outpatient referrals for mental health follow-up including medication management/psychotherapy/substance use treatment  Rosezetta Schlatter, MD PGY-1 10/16/2021 [place] Department of Psychiatry

## 2021-10-16 NOTE — Progress Notes (Signed)
Adult Psychoeducational Group Note  Date:  10/16/2021 Time:  9:05 PM  Group Topic/Focus:  Wrap-Up Group:   The focus of this group is to help patients review their daily goal of treatment and discuss progress on daily workbooks.  Participation Level:  Did Not Attend  Participation Quality:   Did Not Attend  Affect:   Did Not Attend  Cognitive:   Did Not Attend  Insight: None  Engagement in Group:   Did Not Attend  Modes of Intervention:   Did Not Attend  Additional Comments:  Pt did not attend evening wrap up group tonight.  Candy Sledge 10/16/2021, 9:05 PM

## 2021-10-16 NOTE — Progress Notes (Addendum)
D:  Patient's self inventory sheet, patient sleeps good, sleep medication helpful.  Good appetite, normal energy level, good concentration.  Denied depression, hopeless and anxiety.  Denied withdrawals.  Denied SI.  Denied physical problems.  Denied pain.  Goal is talk about discharge.  Plans to talk to SW.  No discharge plans. A:  Medications administered per MD orders.  Emotional support and encouragement given patient. R:  Denied SI and HI, contracts for safety.  Denied visual  hallucinations.  Does hear voices at times.  Safety maintained with 15 minute checks.

## 2021-10-17 MED ORDER — PROPRANOLOL HCL 20 MG PO TABS
20.0000 mg | ORAL_TABLET | Freq: Two times a day (BID) | ORAL | Status: DC
  Administered 2021-10-17 – 2021-10-19 (×4): 20 mg via ORAL
  Filled 2021-10-17 (×2): qty 1
  Filled 2021-10-17: qty 2
  Filled 2021-10-17 (×6): qty 1

## 2021-10-17 NOTE — Progress Notes (Signed)
[facility] MD Progress Note  10/17/2021 7:50 PM Tyler White  MRN:  [id number] Subjective:   Tyler White is a 25 year old male with a psychiatric history of schizoaffective disorder- bipolar type, cocaine use disorder-severe/dependent, alcohol use disorder-mild, and tobacco use disorder-mild who presented to [facility] ED for worsening depression, anxiety, and AH, and was admitted to [facility] for stabilization.   Per chart review, Tyler White has had multiple inpatient psychiatric admissions since 2018, mostly 2/2 substance-induced mood disorder. Tyler White says that his AH began at age 17; collateral from a previous admission stated, "Tyler White states that the Tyler White's issues began when "Tyler White got laced" with some substance in high school. She says Tyler White has never been the same since and the rocking back and forth and hearing voices began at that time."  On Assessment Today (10/19): Case was discussed in the multidisciplinary team. [facility] was reviewed and Tyler White was compliant with medications. Tyler White seen, assessed, and discussed with attending Dr. Caswell Corwin.  Tyler White appeared to have some thought blocking throughout assessment today, which is a regression from yesterday. However Tyler White maintained his improvements in a less guarded affect. Tyler White stated Tyler White slept well, and his appetite is intact. Tyler White says that Tyler White is not cold, but is wrapped tightly under covers, moving his legs restlessly. This Probation officer has him stand, and the restlessness in his legs persists, largely without Tyler White's knowledge. Tyler White has been attending groups and participating appropriately.  Tyler White denies present SI/HI/AVH, and notes an overall improvement in AH.  Tyler White continues to endorse paranoid ideation about being "watched" while sleeping but is unable to state who is watching him.  Tyler White has tolerated Abilify without issue, and is agreeable to restarting his LAI.   Principal Problem: Schizoaffective disorder, bipolar type ([place]) Diagnosis: Principal Problem:    Schizoaffective disorder, bipolar type ([place]) Active Problems:   Cocaine abuse, continuous use (HCC)   Homelessness   Marijuana abuse in remission   Xerosis of skin  Total Time spent with Tyler White: I personally spent 35 minutes on the unit in direct Tyler White care. The direct Tyler White care time included face-to-face time with the Tyler White, reviewing the Tyler White's chart, communicating with other professionals, and coordinating care. Greater than 50% of this time was spent in counseling or coordinating care with the Tyler White regarding goals of hospitalization, psycho-education, and discharge planning needs.   Past Psychiatric History: See H&P  Past Medical History:  Past Medical History:  Diagnosis Date   Hernia, inguinal, right    Inguinal hernia    right   Psychiatric illness    Schizophrenia ([place])    History reviewed. No pertinent surgical history. Family History:  Family History  Problem Relation Age of Onset   Psychiatric Illness Mother    Hypertension Sister    Family Psychiatric  History: See H&P Social History:  Social History   Substance and Sexual Activity  Alcohol Use Not Currently     Social History   Substance and Sexual Activity  Drug Use Not Currently   Types: Marijuana, Cocaine   Comment: denies currently    Social History   Socioeconomic History   Marital status: Single    Spouse name: Not on file   Number of children: Not on file   Years of education: 10   Highest education level: Not on file  Occupational History   Not on file  Tobacco Use   Smoking status: Former    Packs/day: 0.50    Years: 5.00    Pack years: 2.50    Types:  Cigarettes   Smokeless tobacco: Never  Vaping Use   Vaping Use: Never used  Substance and Sexual Activity   Alcohol use: Not Currently   Drug use: Not Currently    Types: Marijuana, Cocaine    Comment: denies currently   Sexual activity: Yes    Birth control/protection: None  Other Topics Concern   Not on file   Social History Narrative   ** Merged History Encounter **       ** Merged History Encounter **       Social Determinants of Health   Financial Resource Strain: Not on file  Food Insecurity: Not on file  Transportation Needs: Not on file  Physical Activity: Not on file  Stress: Not on file  Social Connections: Not on file   Additional Social History:      Sleep: Good  Appetite:  Good  Current Medications: Current Facility-Administered Medications  Medication Dose Route Frequency Provider Last Rate Last Admin   acetaminophen (TYLENOL) tablet 650 mg  650 mg Oral Q6H PRN Starkes-Perry, Gayland Curry, FNP       alum & mag hydroxide-simeth (MAALOX/MYLANTA) 200-200-20 MG/5ML suspension 30 mL  30 mL Oral Q4H PRN Starkes-Perry, Gayland Curry, FNP       ARIPiprazole (ABILIFY) tablet 10 mg  10 mg Oral Daily Rosezetta Schlatter, MD   10 mg at 10/17/21 5027   benztropine (COGENTIN) tablet 0.5 mg  0.5 mg Oral BID Rosezetta Schlatter, MD   0.5 mg at 10/17/21 1655   hydrocerin (EUCERIN) cream   Topical PRN Rosezetta Schlatter, MD       hydrOXYzine (ATARAX/VISTARIL) tablet 25 mg  25 mg Oral TID PRN Rosezetta Schlatter, MD   25 mg at 10/15/21 1640   magnesium hydroxide (MILK OF MAGNESIA) suspension 30 mL  30 mL Oral Daily PRN Starkes-Perry, Gayland Curry, FNP       neomycin-bacitracin-polymyxin (NEOSPORIN) ointment packet   Topical BID Massengill, Ovid Curd, MD   Given at 10/15/21 1022   propranolol (INDERAL) tablet 20 mg  20 mg Oral BID Rosezetta Schlatter, MD   20 mg at 10/17/21 1656   risperiDONE (RISPERDAL M-TABS) disintegrating tablet 6 mg  6 mg Oral QHS Nelda Marseille, Amy E, MD   6 mg at 10/16/21 2114   traZODone (DESYREL) tablet 100 mg  100 mg Oral QHS PRN Suella Broad, FNP   100 mg at 10/16/21 2114    Lab Results:  No results found for this or any previous visit (from the past 76 hour(s)).   Blood Alcohol level:  Lab Results  Component Value Date   ETH <10 10/07/2021   ETH <10 74/11/8785    Metabolic Disorder  Labs: Lab Results  Component Value Date   HGBA1C 5.0 10/09/2021   MPG 96.8 10/09/2021   MPG 99.67 11/02/2020   Lab Results  Component Value Date   PROLACTIN 32.5 (H) 05/30/2018   Lab Results  Component Value Date   CHOL 172 10/09/2021   TRIG 131 10/09/2021   HDL 50 10/09/2021   CHOLHDL 3.4 10/09/2021   VLDL 26 10/09/2021   LDLCALC 96 10/09/2021   LDLCALC 68 11/02/2020    Physical Findings: AIMS: Facial and Oral Movements Muscles of Facial Expression: None, normal Lips and Perioral Area: None, normal Jaw: None, normal Tongue: None, normal,Extremity Movements Upper (arms, wrists, hands, fingers): None, normal Lower (legs, knees, ankles, toes): Moderate, Trunk Movements Neck, shoulders, hips: None, normal, Overall Severity Severity of abnormal movements (highest score from questions above): Moderate Incapacitation due  to abnormal movements: None, normal Tyler White's awareness of abnormal movements (rate only Tyler White's report): Aware, no distress, Dental Status Current problems with teeth and/or dentures?: No Does Tyler White usually wear dentures?: No  CIWA:  CIWA-Ar Total: 1  Musculoskeletal: Strength & Muscle Tone: within normal limits Gait & Station: normal, steady Tyler White leans: N/A  Psychiatric Specialty Exam:  Presentation  General Appearance: Appropriate for Environment; Casual  Eye Contact:Good  Speech: Mostly clear; less periods of speech latency, but still present  Speech Volume:Normal  Handedness:Right   Mood and Affect  Mood: "I'm fine"  Affect:appears less guarded, pleasant   Thought Process  Thought Processes:Concrete, vague, but improving  Orientation: oriented to self, year, and month  Thought Content: Continues to endorse ongoing non-command AH but decreased in volume; denies SI/HI/VH, first-rank sx, and ideas of reference.  Made no delusional statements today but endorsed paranoid ideation about male nurse.  Has less periods of speech  latency and thought blocking suggestive of internal preoccupation, but still present.  History of Schizophrenia/Schizoaffective disorder:Yes  Duration of Psychotic Symptoms:Greater than six months  Hallucinations:AH - noncommand in nature  Ideas of Reference:Paranoia  Suicidal Thoughts:Denied  Homicidal Thoughts:Denied   Sensorium  Memory: Fair  Judgment:Intact  Insight:Present Improving  Executive Functions  Concentration:Fair  Attention Span:Fair  [place]   Psychomotor Activity  Psychomotor Activity:EPS, restlessness/akathisia   Assets  Assets:Desire for Improvement; Physical Health; Resilience   Sleep  Number of Hours of Sleep: 5.75   Physical Exam Vitals and nursing note reviewed.  Constitutional:      General: Tyler White is not in acute distress.    Appearance: Normal appearance.  HENT:     Head: Normocephalic and atraumatic.  Pulmonary:     Effort: Pulmonary effort is normal.  Musculoskeletal:        General: Normal range of motion.     Comments: No tremors, no cogwheeling; AIMS 7  Skin:    General: Skin is warm and dry.     Findings: Lesion present.     Comments: Lesion on forehead healing appropriately  Neurological:     General: No focal deficit present.     Mental Status: Tyler White is alert.     Gait: Gait normal.   Review of Systems  Constitutional: Negative.   Respiratory:  Negative for shortness of breath.   Cardiovascular:  Negative for chest pain.  Gastrointestinal:  Negative for abdominal pain, constipation, diarrhea and nausea.  Genitourinary: Negative.   Neurological:  Negative for tremors and headaches.  Blood pressure 129/83, pulse 66, temperature 98.7 F (37.1 C), resp. rate 16, height 5\' 9"  (1.753 m), weight 67.1 kg, SpO2 98 %. Body mass index is 21.86 kg/m.   Treatment Plan Summary: Tyler White is a 25 year old male with a history of schizoaffective disorder-bipolar type, cocaine use  disorder, and multiple past inpatient psychiatric admissions who was admitted for worsening depression and auditory hallucinations after being released from jail.  Tyler White continues to experience some thought blocking (improving), thought broadcasting, hallucinations - adding Abilify as an adjunct.  May need to switch antipsychotics or consider transition to a typical agent if Tyler White does not continue to improve.   Daily contact with Tyler White to assess and evaluate symptoms and progress in treatment and Medication management  Schizoaffective disorder- BP type -Continue Risperdal 6 mg nightly, will goal to transition to [place] on 10/20 -Continue Cogentin 0.5 mg twice daily for EPS and monitor AIMS (AIMS 7 today) Increase Propranolol to 20  mg BID on 10/20 (10 mg this AM, 20 mg tonight). -Continue Abilify 10 mg  - Continue room lockout during meals and groups  - Has Trazodone PRN for insomnia and encouraged improved sleep hygiene during the day - EKG QTC 435 - monitoring on present Risperdal dose   Cocaine Use disorder- continuous use Marijuana use disorder- in remission No withdrawal symptoms experienced at this time - Advised discontinuation of drug use.  We will offer Tyler White substance use treatment options.   Medical Management Covid negative CMP: Unremarkable CBC: unremarkable EtOH: <10 UDS: Positive cocaine TSH: 1.675 A1C: 5.0% Lipids: Unremarkable   Xerosis of skin Itching in bilateral antecubital fossae, did not appear eczematous -Ordered Eucerin cream BID as needed.  Discharge Planning:              -- Social work and case management to assist with discharge planning and identification of hospital follow-up needs prior to discharge.              -- Tyler White curently under IVC            -- Discharge Concerns: Need to establish a safety plan; Medication compliance and effectiveness             -- Discharge Goals: To shelter or with family with outpatient referrals for  mental health follow-up including medication management/psychotherapy/substance use treatment  Rosezetta Schlatter, MD PGY-1 10/17/2021 [place] Department of Psychiatry

## 2021-10-17 NOTE — [facility] Counselor (Signed)
CSW left a message for [facility] regarding the pt.   Toney Reil, [place] Worker [employer]

## 2021-10-17 NOTE — Plan of Care (Signed)
Nurse discussed coping skills with patient.  

## 2021-10-17 NOTE — Progress Notes (Addendum)
DAR Note: Patient  calm and cooperative, affect blunted and mood depressed, pt denies SI/HI/AVH. Pt with delayed responses, and denies any other concerns. Trazodone 50mg  given for insomnia along with scheduled Risperidone. Q15 minute checks are being maintained for safety.   10/17/21 2159  Psych Admission Type (Psych Patients Only)  Admission Status Involuntary  Psychosocial Assessment  Patient Complaints None  Eye Contact Brief  Facial Expression Flat  Affect Blunted  Speech Soft;Logical/coherent  Interaction Minimal  Motor Activity Other (Comment) (wnl)  Appearance/Hygiene Unremarkable  Behavior Characteristics Cooperative;Guarded  Mood [place]  Thought Process  Coherency Blocking  Content WDL  Delusions None reported or observed  Perception WDL  Hallucination None reported or observed  Judgment Poor  Confusion None  Danger to Self  Current suicidal ideation? Denies  Danger to Others  Danger to Others None reported or observed  Danger to Others Abnormal  Harmful Behavior to others No threats or harm toward other people  Destructive Behavior No threats or harm toward property

## 2021-10-17 NOTE — [facility] Group Notes (Signed)
Pt did not attend NA group

## 2021-10-17 NOTE — Progress Notes (Signed)
D:  Patient denied SI and HI, contracts for safety.  Denied A/V hallucinations.  Denied A/V hallucinations. A:  Medications administered per MD orders.  Emotional support and encouragement given patient. R:  Safety maintained with 15 minute checks.

## 2021-10-17 NOTE — [facility] Group Notes (Signed)
The focus of this group is to help patients establish daily goals to achieve during treatment and discuss how the patient can incorporate goal setting into their daily lives to aide in recovery.  Pt did not attend morning goals group.

## 2021-10-17 NOTE — Group Note (Signed)
Recreation Therapy Group Note   Group Topic:Stress Management  Group Date: 10/17/2021 Start Time: 0930 End Time: 0945 Facilitators: Victorino Sparrow, LRT/CTRS Location: [address] Hall Dayroom  Goal Area(s) Addresses:  Patient will identify positive stress management techniques. Patient will identify benefits of using stress management post d/c.   Group Description:  Meditation.  LRT played a meditation that focused on setting personal boundaries for self care.  The meditation focused on learning to say no for things you don't want to do instead of trying to make others happy at the expense of yourself.   Affect/Mood: N/A   Participation Level: Did not attend    Clinical Observations/Individualized Feedback: Pt did not attend group.    Plan: Continue to engage patient in RT group sessions 2-3x/week.   Victorino Sparrow, LRT/CTRS 10/17/2021 11:33 AM

## 2021-10-17 NOTE — Group Note (Unsigned)
Group Topic: Goal Setting  Group Date: 10/17/2021 Start Time: 0900 End Time: 1000 Facilitators: Jacqlyn Larsen, NT  Department: [place] ADULT [facility]  Number of Participants: 10  Group Focus: daily focus Treatment Modality:  Psychoeducation Interventions utilized were orientation and reality testing Purpose: regain self-worth   Name: Tyler White Date of Birth: [birth year]  MR: [id number]    Level of Participation: {THERAPIES; PSYCH GROUP PARTICIPATION RNHAF:79038} Quality of Participation: {THERAPIES; PSYCH QUALITY OF PARTICIPATION:23992} Interactions with others: {THERAPIES; PSYCH INTERACTIONS:23993} Mood/Affect: {THERAPIES; PSYCH MOOD/AFFECT:23994} Triggers (if applicable): *** Cognition: {THERAPIES; PSYCH COGNITION:23995} Progress: {THERAPIES; PSYCH PROGRESS:23997} Response: *** Plan: {THERAPIES; PSYCH BFXO:32919}  Patients Problems:  Patient Active Problem List   Diagnosis Date Noted   Xerosis of skin 10/09/2021   Schizoaffective disorder ([place]) 10/08/2021   Schizoaffective disorder, bipolar type ([place]) 04/26/2021   Marijuana abuse in remission 04/26/2021   Polysubstance abuse ([place]) 04/23/2021   Homelessness 04/23/2021   Schizophrenia ([place]) 11/19/2020   Cocaine abuse, continuous use ([place]) 02/19/2018   Cocaine abuse with cocaine-induced mood disorder ([place]) 02/19/2018   Psychosis ([place]) 11/27/2017   Cannabis abuse with psychotic disorder, with delusions ([place]) 11/26/2017   Schizophrenia, unspecified ([place]) 11/26/2017

## 2021-10-17 NOTE — Group Note (Signed)
[place] LCSW Group Therapy Note   Group Date: 10/17/2021 Start Time: 1300 End Time: 1400   Type of Therapy/Topic:  Group Therapy:  Emotion Regulation  Participation Level:  Did Not Attend   Mood:  Description of Group:    The purpose of this group is to assist patients in learning to regulate negative emotions and experience positive emotions. Patients will be guided to discuss ways in which they have been vulnerable to their negative emotions. These vulnerabilities will be juxtaposed with experiences of positive emotions or situations, and patients challenged to use positive emotions to combat negative ones. Special emphasis will be placed on coping with negative emotions in conflict situations, and patients will process healthy conflict resolution skills.  Therapeutic Goals: Patient will identify two positive emotions or experiences to reflect on in order to balance out negative emotions:  Patient will label two or more emotions that they find the most difficult to experience:  Patient will be able to demonstrate positive conflict resolution skills through discussion or role plays:   Summary of Patient Progress:   Did not attend    Therapeutic Modalities:   Cognitive Behavioral Therapy Feelings Identification Dialectical Behavioral Therapy   Darleen Crocker, LCSWA

## 2021-10-18 ENCOUNTER — Other Ambulatory Visit (HOSPITAL_COMMUNITY): Payer: Self-pay

## 2021-10-18 MED ORDER — PALIPERIDONE PALMITATE ER 234 MG/1.5ML IM SUSY
234.0000 mg | PREFILLED_SYRINGE | Freq: Once | INTRAMUSCULAR | Status: AC
  Administered 2021-10-18: 234 mg via INTRAMUSCULAR

## 2021-10-18 MED ORDER — RISPERIDONE 1 MG PO TBDP
3.0000 mg | ORAL_TABLET | Freq: Two times a day (BID) | ORAL | Status: DC
  Administered 2021-10-18 – 2021-10-19 (×2): 3 mg via ORAL
  Filled 2021-10-18 (×6): qty 3

## 2021-10-18 MED ORDER — PALIPERIDONE PALMITATE ER 156 MG/ML IM SUSY
PREFILLED_SYRINGE | INTRAMUSCULAR | 0 refills | Status: DC
  Filled 2021-10-18: qty 1, 1d supply, fill #0
  Filled 2021-10-18: qty 1, 30d supply, fill #0
  Filled 2021-10-22: qty 1, 28d supply, fill #0

## 2021-10-18 MED ORDER — PALIPERIDONE PALMITATE ER 156 MG/ML IM SUSY
156.0000 mg | PREFILLED_SYRINGE | INTRAMUSCULAR | Status: DC
  Administered 2021-10-22: 156 mg via INTRAMUSCULAR

## 2021-10-18 MED ORDER — PALIPERIDONE PALMITATE ER 234 MG/1.5ML IM SUSY
PREFILLED_SYRINGE | INTRAMUSCULAR | 0 refills | Status: DC
  Filled 2021-10-18: qty 1.5, 28d supply, fill #0

## 2021-10-18 NOTE — Progress Notes (Signed)
Psychoeducational Group Note  Date:  10/18/2021 Time:  2015  Group Topic/Focus:  Wrap up group  Participation Level: Did Not Attend  Participation Quality:  Not Applicable  Affect:  Not Applicable  Cognitive:  Not Applicable  Insight:  Not Applicable  Engagement in Group: Not Applicable  Additional Comments:  Did not attend.   Winfield Rast S 10/18/2021, 9:00 PM

## 2021-10-18 NOTE — Progress Notes (Signed)
  Pt presents with high anxiety and depression on assessment.  Pt states, "anxious to go home."  Pt sat in room isolated from peers, with left leg shaking, facing the window.  Pt was encouraged to attend group.  Pt denies SI/HI and verbally contracts for safety.  Pt denies AVH.  Pt remains safe on unit with Q 15 minute safety checks.    10/18/21 2128  Psych Admission Type (Psych Patients Only)  Admission Status Involuntary  Psychosocial Assessment  Patient Complaints Anxiety;Depression ("Pt states anxious to go home")  Eye Contact Brief  Facial Expression Flat  Affect Blunted  Speech Soft;Logical/coherent  Interaction Minimal  Motor Activity Other (Comment) (wnl)  Appearance/Hygiene Unremarkable  Behavior Characteristics Cooperative;Guarded  Mood Depressed;Anxious  Thought Process  Coherency Blocking  Content WDL  Delusions None reported or observed  Perception WDL  Hallucination None reported or observed  Judgment Poor  Confusion None  Danger to Self  Current suicidal ideation? Denies  Danger to Others  Danger to Others None reported or observed  Danger to Others Abnormal  Harmful Behavior to others No threats or harm toward other people  Destructive Behavior No threats or harm toward property

## 2021-10-18 NOTE — Plan of Care (Signed)
  Problem: Education: Goal: Knowledge of the prescribed therapeutic regimen will improve Outcome: Progressing   Problem: Coping: Goal: Will verbalize feelings Outcome: Progressing   Problem: Health Behavior/Discharge Planning: Goal: Compliance with therapeutic regimen will improve Outcome: Progressing

## 2021-10-18 NOTE — [facility] Group Notes (Signed)
Patient did not attend the relaxation group. 

## 2021-10-18 NOTE — Group Note (Signed)
Group Topic: Goal Setting  Group Date: 10/18/2021 Start Time: 0900 End Time: 0930 Facilitators: Takumi Din, Tyrell Antonio, NT  Department: [place] [facility]  Number of Participants: 5  Group Focus: goals/reality orientation Treatment Modality:  Psychoeducation Interventions utilized were orientation Purpose: reinforce self-care  Name: Tyler White Date of Birth: July 02, 1996  MR: [id number]    Level of Participation: Pt did not attend group.

## 2021-10-18 NOTE — Progress Notes (Signed)
   10/18/21 0631  Vital Signs  Pulse Rate (!) 112  Pulse Rate Source Monitor  BP 102/81  BP Location Left Arm  BP Method Automatic  Patient Position (if appropriate) Standing  Oxygen Therapy  SpO2 96 %   D: Patient denies SI/HI/AVH. Pt. Denied both anxiety and depression. Pt. Isolate in room. A:  Patient took scheduled medicine.  Support and encouragement provided Routine safety checks conducted every 15 minutes. Patient  Informed to notify staff with any concerns.   R:  Safety maintained.

## 2021-10-18 NOTE — Progress Notes (Signed)
[facility] MD Progress Note  10/18/2021 7:19 AM Tyler White  MRN:  [id number] Subjective:   Tyler White is a 25 year old male with a psychiatric history of schizoaffective disorder- bipolar type, cocaine use disorder-severe/dependent, alcohol use disorder-mild, and tobacco use disorder-mild who presented to [facility] ED for worsening depression, anxiety, and AH, and was admitted to [facility] for stabilization.   Per chart review, Tyler White has had multiple inpatient psychiatric admissions since 2018, mostly 2/2 substance-induced mood disorder. Tyler White says that his AH began at age 70; collateral from a previous admission stated, "Tyler White states that the patient's issues began when "he got laced" with some substance in high school. She says he has never been the same since and the rocking back and forth and hearing voices began at that time."  On Assessment Today (10/20): Case was discussed in the multidisciplinary team. MAR was reviewed and patient was compliant with medications. Patient seen, assessed, and discussed with attending Dr. Caswell Corwin.  Patient appeared to have much less thought blocking throughout assessment today, continuing to . However he maintained his improvements in a less guarded affect. Patient stated he slept well, and his appetite is intact. The restlessness in his legs persists, but patient endorses improvements with the increased dose of Propranolol. Patient has been attending groups and participating appropriately.  Patient denies present SI/HI/AVH, and notes an overall improvement in [facility]; he has not heard them since yesterday.  He continues to endorse paranoid ideation about being "watched" while sleeping but is unable to state who is watching him.  He has tolerated Abilify without issue, and will be starting his [place] LAI today, of which he continues to be agreeable to initiate.    Principal Problem: Schizoaffective disorder, bipolar type ([place]) Diagnosis: Principal Problem:    Schizoaffective disorder, bipolar type ([place] II) Active Problems:   Cocaine abuse, continuous use (HCC)   Homelessness   Marijuana abuse in remission   Xerosis of skin  Total Time spent with patient: I personally spent 35 minutes on the unit in direct patient care. The direct patient care time included face-to-face time with the patient, reviewing the patient's chart, communicating with other professionals, and coordinating care. Greater than 50% of this time was spent in counseling or coordinating care with the patient regarding goals of hospitalization, psycho-education, and discharge planning needs.   Past Psychiatric History: See H&P  Past Medical History:  Past Medical History:  Diagnosis Date   Hernia, inguinal, right    Inguinal hernia    right   Psychiatric illness    Schizophrenia ([place])    History reviewed. No pertinent surgical history. Family History:  Family History  Problem Relation Age of Onset   Psychiatric Illness Mother    Hypertension Sister    Family Psychiatric  History: See H&P Social History:  Social History   Substance and Sexual Activity  Alcohol Use Not Currently     Social History   Substance and Sexual Activity  Drug Use Not Currently   Types: Marijuana, Cocaine   Comment: denies currently    Social History   Socioeconomic History   Marital status: Single    Spouse name: Not on file   Number of children: Not on file   Years of education: 10   Highest education level: Not on file  Occupational History   Not on file  Tobacco Use   Smoking status: Former    Packs/day: 0.50    Years: 5.00    Pack years: 2.50    Types:  Cigarettes   Smokeless tobacco: Never  Vaping Use   Vaping Use: Never used  Substance and Sexual Activity   Alcohol use: Not Currently   Drug use: Not Currently    Types: Marijuana, Cocaine    Comment: denies currently   Sexual activity: Yes    Birth control/protection: None  Other Topics Concern   Not on file   Social History Narrative   ** Merged History Encounter **       ** Merged History Encounter **       Social Determinants of Health   Financial Resource Strain: Not on file  Food Insecurity: Not on file  Transportation Needs: Not on file  Physical Activity: Not on file  Stress: Not on file  Social Connections: Not on file   Additional Social History:      Sleep: Good  Appetite:  Good  Current Medications: Current Facility-Administered Medications  Medication Dose Route Frequency Provider Last Rate Last Admin   acetaminophen (TYLENOL) tablet 650 mg  650 mg Oral Q6H PRN Starkes-Perry, Gayland Curry, FNP       alum & mag hydroxide-simeth (MAALOX/MYLANTA) 200-200-20 MG/5ML suspension 30 mL  30 mL Oral Q4H PRN Starkes-Perry, Gayland Curry, FNP       ARIPiprazole (ABILIFY) tablet 10 mg  10 mg Oral Daily Rosezetta Schlatter, MD   10 mg at 10/17/21 6045   benztropine (COGENTIN) tablet 0.5 mg  0.5 mg Oral BID Rosezetta Schlatter, MD   0.5 mg at 10/17/21 1655   hydrocerin (EUCERIN) cream   Topical PRN Rosezetta Schlatter, MD       hydrOXYzine (ATARAX/VISTARIL) tablet 25 mg  25 mg Oral TID PRN Rosezetta Schlatter, MD   25 mg at 10/15/21 1640   magnesium hydroxide (MILK OF MAGNESIA) suspension 30 mL  30 mL Oral Daily PRN Starkes-Perry, Gayland Curry, FNP       neomycin-bacitracin-polymyxin (NEOSPORIN) ointment packet   Topical BID Massengill, Ovid Curd, MD   Given at 10/15/21 1022   propranolol (INDERAL) tablet 20 mg  20 mg Oral BID Rosezetta Schlatter, MD   20 mg at 10/17/21 1656   risperiDONE (RISPERDAL M-TABS) disintegrating tablet 6 mg  6 mg Oral QHS Nelda Marseille, Amy E, MD   6 mg at 10/17/21 2102   traZODone (DESYREL) tablet 100 mg  100 mg Oral QHS PRN Suella Broad, FNP   100 mg at 10/17/21 2102    Lab Results:  No results found for this or any previous visit (from the past 55 hour(s)).   Blood Alcohol level:  Lab Results  Component Value Date   ETH <10 10/07/2021   ETH <10 40/98/1191    Metabolic Disorder  Labs: Lab Results  Component Value Date   HGBA1C 5.0 10/09/2021   MPG 96.8 10/09/2021   MPG 99.67 11/02/2020   Lab Results  Component Value Date   PROLACTIN 32.5 (H) 05/30/2018   Lab Results  Component Value Date   CHOL 172 10/09/2021   TRIG 131 10/09/2021   HDL 50 10/09/2021   CHOLHDL 3.4 10/09/2021   VLDL 26 10/09/2021   LDLCALC 96 10/09/2021   LDLCALC 68 11/02/2020    Physical Findings: AIMS: Facial and Oral Movements Muscles of Facial Expression: None, normal Lips and Perioral Area: None, normal Jaw: None, normal Tongue: None, normal,Extremity Movements Upper (arms, wrists, hands, fingers): None, normal Lower (legs, knees, ankles, toes): None, normal, Trunk Movements Neck, shoulders, hips: None, normal, Overall Severity Severity of abnormal movements (highest score from questions above): None, normal  Incapacitation due to abnormal movements: None, normal Patient's awareness of abnormal movements (rate only patient's report): No Awareness, Dental Status Current problems with teeth and/or dentures?: No Does patient usually wear dentures?: No  CIWA:  CIWA-Ar Total: 1  Musculoskeletal: Strength & Muscle Tone: within normal limits Gait & Station: normal, steady Patient leans: N/A  Psychiatric Specialty Exam:  Presentation  General Appearance: Appropriate for Environment; Casual  Eye Contact:Good  Speech: Mostly clear; less periods of speech latency, but still present  Speech Volume:Normal  Handedness:Right   Mood and Affect  Mood: "I'm okay"  Affect:appears less guarded, pleasant   Thought Process  Thought Processes:Concrete, vague, but improving  Orientation: oriented to self, year, and month  Thought Content: Denies SI/HI/AVH, first-rank sx, and ideas of reference.  Made no delusional statements today nor paranoid ideation.  Has less periods of speech latency and thought blocking suggestive of internal preoccupation, but still present.  History  of Schizophrenia/Schizoaffective disorder:Yes  Duration of Psychotic Symptoms:Greater than six months  Hallucinations:Denies  Ideas of Reference:Paranoia  Suicidal Thoughts:Denied  Homicidal Thoughts:Denied   Sensorium  Memory: Fair  Judgment:Intact  Insight:Present Improving  Executive Functions  Concentration:Fair  Attention Span:Fair  [place]   Psychomotor Activity  Psychomotor Activity:EPS, restlessness/akathisia   Assets  Assets:Desire for Improvement; Physical Health; Resilience   Sleep  Number of Hours of Sleep: 4.75   Physical Exam Vitals and nursing note reviewed.  Constitutional:      General: He is not in acute distress.    Appearance: Normal appearance.  HENT:     Head: Normocephalic and atraumatic.  Pulmonary:     Effort: Pulmonary effort is normal.  Musculoskeletal:        General: Normal range of motion.     Comments: No tremors, no cogwheeling; AIMS 7  Skin:    General: Skin is warm and dry.     Findings: Lesion present.     Comments: Lesion on forehead healing appropriately  Neurological:     General: No focal deficit present.     Mental Status: He is alert.     Gait: Gait normal.   Review of Systems  Constitutional: Negative.   Respiratory:  Negative for shortness of breath.   Cardiovascular:  Negative for chest pain.  Gastrointestinal:  Negative for abdominal pain, constipation, diarrhea and nausea.  Genitourinary: Negative.   Neurological:  Negative for tremors and headaches.  Blood pressure 102/81, pulse (!) 112, temperature (!) 97.5 F (36.4 C), resp. rate 16, height 5\' 9"  (1.753 m), weight 67.1 kg, SpO2 96 %. Body mass index is 21.86 kg/m.   Treatment Plan Summary: Deston is a 25 year old male with a history of schizoaffective disorder-bipolar type, cocaine use disorder, and multiple past inpatient psychiatric admissions who was admitted for worsening depression and  auditory hallucinations after being released from jail.  Patient denies thought blocking, thought broadcasting, and hallucinations - adding Abilify as an adjunct.  May need to switch antipsychotics or consider transition to a typical agent if he does not continue to improve.   Daily contact with patient to assess and evaluate symptoms and progress in treatment and Medication management  Schizoaffective disorder- BP type -Switch Risperdal to 3 mg; received LAI Invega Sustenna 234 mg IM today; will receive 156 mg LAI on 10/24. -Continue Cogentin 0.5 mg twice daily for EPS and monitor AIMS (AIMS 7 on 10/19) Continue Propranolol 20 mg BID for akathisia -Continue Abilify 10 mg  - Continue room lockout  during meals and groups  - Has Trazodone PRN for insomnia and encouraged improved sleep hygiene during the day - EKG QTC 435 - monitoring on present Risperdal dose   Cocaine Use disorder- continuous use Marijuana use disorder- in remission No withdrawal symptoms experienced at this time - Advised discontinuation of drug use.  We will offer patient substance use treatment options.   Medical Management Covid negative CMP: Unremarkable CBC: unremarkable EtOH: <10 UDS: Positive cocaine TSH: 1.675 A1C: 5.0% Lipids: Unremarkable   Xerosis of skin Itching in bilateral antecubital fossae, did not appear eczematous -Ordered Eucerin cream BID as needed.  Discharge Planning:              -- Social work and case management to assist with discharge planning and identification of hospital follow-up needs prior to discharge.              -- Patient curently under IVC; plan to d/c 10/24 or 10/25.            -- Discharge Concerns: Need to establish a safety plan; Medication compliance and effectiveness             -- Discharge Goals: To shelter with outpatient referrals for mental health follow-up including medication management/psychotherapy/substance use treatment  Rosezetta Schlatter,  MD PGY-1 10/18/2021 [place] Department of Psychiatry

## 2021-10-19 ENCOUNTER — Encounter (HOSPITAL_COMMUNITY): Payer: Self-pay

## 2021-10-19 MED ORDER — PROPRANOLOL HCL 20 MG PO TABS
20.0000 mg | ORAL_TABLET | Freq: Once | ORAL | Status: AC
  Administered 2021-10-19: 20 mg via ORAL
  Filled 2021-10-19: qty 1

## 2021-10-19 MED ORDER — TRAZODONE HCL 50 MG PO TABS
50.0000 mg | ORAL_TABLET | Freq: Every evening | ORAL | Status: DC | PRN
  Administered 2021-10-19 – 2021-10-22 (×4): 50 mg via ORAL
  Filled 2021-10-19: qty 7
  Filled 2021-10-19 (×4): qty 1

## 2021-10-19 MED ORDER — RISPERIDONE 1 MG PO TBDP
3.0000 mg | ORAL_TABLET | Freq: Every day | ORAL | Status: DC
  Administered 2021-10-19 – 2021-10-22 (×4): 3 mg via ORAL
  Filled 2021-10-19 (×2): qty 3
  Filled 2021-10-19: qty 5
  Filled 2021-10-19 (×3): qty 3

## 2021-10-19 MED ORDER — PROPRANOLOL HCL ER 60 MG PO CP24
60.0000 mg | ORAL_CAPSULE | Freq: Every day | ORAL | Status: DC
  Administered 2021-10-20 – 2021-10-23 (×4): 60 mg via ORAL
  Filled 2021-10-19 (×5): qty 1
  Filled 2021-10-19: qty 7

## 2021-10-19 NOTE — Group Note (Signed)
LCSW Group Therapy Note  Group Date: 10/19/2021 Start Time: 1300 End Time: 1330   Type of Therapy and Topic:  Group Therapy - Healthy vs Unhealthy Coping Skills  Participation Level:  Active   Description of Group The focus of this group was to determine what unhealthy coping techniques typically are used by group members and what healthy coping techniques would be helpful in coping with various problems. Patients were guided in becoming aware of the differences between healthy and unhealthy coping techniques. Patients were asked to identify 2-3 healthy coping skills they would like to learn to use more effectively.  Therapeutic Goals Patients learned that coping is what human beings do all day long to deal with various situations in their lives Patients defined and discussed healthy vs unhealthy coping techniques Patients identified their preferred coping techniques and identified whether these were healthy or unhealthy Patients determined 2-3 healthy coping skills they would like to become more familiar with and use more often. Patients provided support and ideas to each other   Summary of Patient Progress:   Due to the acuity and complex discharge plans, group was not held. Patient was provided therapeutic worksheets and asked to meet with CSW as needed.    Therapeutic Modalities Cognitive Behavioral Therapy Motivational Interviewing    Eliott Nine 10/19/2021  1:14 PM

## 2021-10-19 NOTE — Plan of Care (Signed)
  Problem: Self-Concept: Goal: Level of anxiety will decrease Outcome: Progressing Goal: Ability to modify response to factors that promote anxiety will improve Outcome: Progressing   Problem: Education: Goal: Knowledge of the prescribed therapeutic regimen will improve Outcome: Progressing

## 2021-10-19 NOTE — Progress Notes (Signed)
[facility] MD Progress Note  10/19/2021 12:54 PM Tyler White  MRN:  [id number] Subjective:   Tyler White is a 25 year old male with a psychiatric history of schizoaffective disorder- bipolar type, cocaine use disorder-severe/dependent, alcohol use disorder-mild, and tobacco use disorder-mild who presented to [facility] ED for worsening depression, anxiety, and AH, and was admitted to [facility] for stabilization.   Per chart review, Tyler White has had multiple inpatient psychiatric admissions since 2018, mostly 2/2 substance-induced mood disorder. Tyler White says that his AH began at age 10; collateral from a previous admission stated, "Tyler White states that the patient's issues began when "he got laced" with some substance in high school. She says he has never been the same since and the rocking back and forth and hearing voices began at that time."  On Assessment Today (10/21): Case was discussed in the multidisciplinary team. MAR was reviewed and patient was compliant with medications. Patient seen, assessed, and discussed with attending Dr. Caswell Corwin.  Patient appeared to have minimal thought blocking throughout assessment today and maintained his improvements in a less guarded, pleasant affect. Patient stated he slept well, and his appetite is intact. The restlessness in his legs persists, but patient endorses improvements with the increased dose of Propranolol. Patient has been attending groups and participating appropriately. He is forward thinking, with the hope to eventually see his daughter, Tyler White, and is anxious about where he will be staying. Patient denies present SI/HI/AVH and paranoid ideation. He also notes an overall improvement in Tyler White; he has not heard voices since 10/19.  He has tolerated the [place] LAI well, and is aware that he will have his next injection on Monday.   Principal Problem: Schizoaffective disorder, bipolar type ([place]) Diagnosis: Principal Problem:   Schizoaffective disorder, bipolar  type ([place]) Active Problems:   Cocaine abuse, continuous use ([facility])   Homelessness   Marijuana abuse in remission   Xerosis of skin  Total Time spent with patient: I personally spent 35 minutes on the unit in direct patient care. The direct patient care time included face-to-face time with the patient, reviewing the patient's chart, communicating with other professionals, and coordinating care. Greater than 50% of this time was spent in counseling or coordinating care with the patient regarding goals of hospitalization, psycho-education, and discharge planning needs.   Past Psychiatric History: See H&P  Past Medical History:  Past Medical History:  Diagnosis Date   Hernia, inguinal, right    Inguinal hernia    right   Psychiatric illness    Schizophrenia ([place])    History reviewed. No pertinent surgical history. Family History:  Family History  Problem Relation Age of Onset   Psychiatric Illness Mother    Hypertension Sister    Family Psychiatric  History: See H&P Social History:  Social History   Substance and Sexual Activity  Alcohol Use Not Currently     Social History   Substance and Sexual Activity  Drug Use Not Currently   Types: Marijuana, Cocaine   Comment: denies currently    Social History   Socioeconomic History   Marital status: Single    Spouse name: Not on file   Number of children: Not on file   Years of education: 10   Highest education level: Not on file  Occupational History   Not on file  Tobacco Use   Smoking status: Former    Packs/day: 0.50    Years: 5.00    Pack years: 2.50    Types: Cigarettes   Smokeless tobacco:  Never  Vaping Use   Vaping Use: Never used  Substance and Sexual Activity   Alcohol use: Not Currently   Drug use: Not Currently    Types: Marijuana, Cocaine    Comment: denies currently   Sexual activity: Yes    Birth control/protection: None  Other Topics Concern   Not on file  Social History Narrative   ** Merged  History Encounter **       ** Merged History Encounter **       Social Determinants of Health   Financial Resource Strain: Not on file  Food Insecurity: Not on file  Transportation Needs: Not on file  Physical Activity: Not on file  Stress: Not on file  Social Connections: Not on file   Additional Social History:      Sleep: Good  Appetite:  Good  Current Medications: Current Facility-Administered Medications  Medication Dose Route Frequency Provider Last Rate Last Admin   acetaminophen (TYLENOL) tablet 650 mg  650 mg Oral Q6H PRN Starkes-Perry, Gayland Curry, FNP       alum & mag hydroxide-simeth (MAALOX/MYLANTA) 200-200-20 MG/5ML suspension 30 mL  30 mL Oral Q4H PRN Starkes-Perry, Gayland Curry, FNP       ARIPiprazole (ABILIFY) tablet 10 mg  10 mg Oral Daily Rosezetta Schlatter, MD   10 mg at 10/19/21 0830   benztropine (COGENTIN) tablet 0.5 mg  0.5 mg Oral BID Rosezetta Schlatter, MD   0.5 mg at 10/19/21 0830   hydrocerin (EUCERIN) cream   Topical PRN Rosezetta Schlatter, MD       hydrOXYzine (ATARAX/VISTARIL) tablet 25 mg  25 mg Oral TID PRN Rosezetta Schlatter, MD   25 mg at 10/15/21 1640   magnesium hydroxide (MILK OF MAGNESIA) suspension 30 mL  30 mL Oral Daily PRN Starkes-Perry, Gayland Curry, FNP       neomycin-bacitracin-polymyxin (NEOSPORIN) ointment packet   Topical BID Massengill, Ovid Curd, MD   Given at 10/18/21 0910   [START ON 10/22/2021] paliperidone (INVEGA SUSTENNA) injection 156 mg  156 mg Intramuscular Q28 days Harlow Asa, MD       propranolol (INDERAL) tablet 20 mg  20 mg Oral BID Rosezetta Schlatter, MD   20 mg at 10/19/21 0831   propranolol (INDERAL) tablet 20 mg  20 mg Oral Once Rosezetta Schlatter, MD       risperiDONE (RISPERDAL M-TABS) disintegrating tablet 3 mg  3 mg Oral BID Rosezetta Schlatter, MD   3 mg at 10/19/21 0830   traZODone (DESYREL) tablet 100 mg  100 mg Oral QHS PRN Suella Broad, FNP   100 mg at 10/18/21 2128    Lab Results:  No results found for this or any  previous visit (from the past 57 hour(s)).   Blood Alcohol level:  Lab Results  Component Value Date   ETH <10 10/07/2021   ETH <10 93/81/0175    Metabolic Disorder Labs: Lab Results  Component Value Date   HGBA1C 5.0 10/09/2021   MPG 96.8 10/09/2021   MPG 99.67 11/02/2020   Lab Results  Component Value Date   PROLACTIN 32.5 (H) 05/30/2018   Lab Results  Component Value Date   CHOL 172 10/09/2021   TRIG 131 10/09/2021   HDL 50 10/09/2021   CHOLHDL 3.4 10/09/2021   VLDL 26 10/09/2021   LDLCALC 96 10/09/2021   LDLCALC 68 11/02/2020    Physical Findings: AIMS: Facial and Oral Movements Muscles of Facial Expression: None, normal Lips and Perioral Area: None, normal Jaw: None, normal  Tongue: None, normal,Extremity Movements Upper (arms, wrists, hands, fingers): None, normal Lower (legs, knees, ankles, toes): Moderate, Trunk Movements Neck, shoulders, hips: None, normal, Overall Severity Severity of abnormal movements (highest score from questions above): Moderate Incapacitation due to abnormal movements: None, normal Patient's awareness of abnormal movements (rate only patient's report): Aware, no distress, Dental Status Current problems with teeth and/or dentures?: No Does patient usually wear dentures?: No  CIWA:  CIWA-Ar Total: 1  Musculoskeletal: Strength & Muscle Tone: within normal limits Gait & Station: normal, steady Patient leans: N/A  Psychiatric Specialty Exam:  Presentation  General Appearance: Appropriate for Environment; Casual  Eye Contact:Good  Speech: Mostly clear; less periods of speech latency, but still present  Speech Volume:Normal  Handedness:Right   Mood and Affect  Mood: "I'm okay"  Affect:appears less guarded, pleasant   Thought Process  Thought Processes:Concrete, vague, but improving  Orientation: oriented to self, year, and month  Thought Content: Denies SI/HI/AVH, first-rank sx, and ideas of reference.  Made no  delusional statements today nor paranoid ideation.  Has less periods of speech latency and thought blocking suggestive of internal preoccupation, but still present.  History of Schizophrenia/Schizoaffective disorder:Yes  Duration of Psychotic Symptoms:Greater than six months  Hallucinations:Denies  Ideas of Reference:Paranoia  Suicidal Thoughts:Denied  Homicidal Thoughts:Denied   Sensorium  Memory: Fair  Judgment:Intact  Insight:Present Improving  Executive Functions  Concentration:Fair  Attention Span:Fair  Tyler White   Psychomotor Activity  Psychomotor Activity:EPS, restlessness/akathisia   Assets  Assets:Desire for Improvement; Physical Health; Resilience   Sleep  Number of Hours of Sleep: 5.25   Physical Exam Vitals and nursing note reviewed.  Constitutional:      General: He is not in acute distress.    Appearance: Normal appearance.  HENT:     Head: Normocephalic and atraumatic.  Pulmonary:     Effort: Pulmonary effort is normal.  Musculoskeletal:        General: Normal range of motion.     Comments: No tremors, no cogwheeling; AIMS 0  Skin:    General: Skin is warm and dry.     Findings: Lesion present.     Comments: Lesion on forehead healing appropriately  Neurological:     General: No focal deficit present.     Mental Status: He is alert.     Gait: Gait normal.   Review of Systems  Constitutional: Negative.   Respiratory:  Negative for shortness of breath.   Cardiovascular:  Negative for chest pain.  Gastrointestinal:  Negative for abdominal pain, constipation, diarrhea and nausea.  Genitourinary: Negative.   Neurological:  Negative for tremors and headaches.  Blood pressure 120/69, pulse (!) 119, temperature 98.5 F (36.9 C), resp. rate 16, height 5\' 9"  (1.753 m), weight 67.1 kg, SpO2 98 %. Body mass index is 21.86 kg/m.   Treatment Plan Summary: Tyler White is a 25 year old male with a  history of schizoaffective disorder-bipolar type, cocaine use disorder, and multiple past inpatient psychiatric admissions who was admitted for worsening depression and auditory hallucinations after being released from jail.  Patient denies thought blocking, thought broadcasting, and hallucinations - adding Abilify as an adjunct.  May need to switch antipsychotics or consider transition to a typical agent if he does not continue to improve.   Daily contact with patient to assess and evaluate symptoms and progress in treatment and Medication management  Schizoaffective disorder- BP type -Switch Risperdal to 3 mg; received LAI Invega Sustenna 234 mg IM 10/21; will receive  156 mg LAI on 10/24. -Continue Cogentin 0.5 mg twice daily for EPS and monitor AIMS  Increase Propranolol to 20 mg TID for akathisia today. Will transition to Propranolol LA- 24 hr 60 mg daily on 10/22. -Continue Abilify 10 mg  - Continue room lockout during meals and groups  - Has Trazodone PRN for insomnia and encouraged improved sleep hygiene during the day - EKG QTC 435 - monitoring on present Risperdal dose   Cocaine Use disorder- continuous use Marijuana use disorder- in remission No withdrawal symptoms experienced at this time - Advised discontinuation of drug use.  We will offer patient substance use treatment options.   Medical Management Covid negative CMP: Unremarkable CBC: unremarkable EtOH: <10 UDS: Positive cocaine TSH: 1.675 A1C: 5.0% Lipids: Unremarkable   Xerosis of skin Itching in bilateral antecubital fossae, did not appear eczematous -Ordered Eucerin cream BID as needed.  Discharge Planning:              -- Social work and case management to assist with discharge planning and identification of White follow-up needs prior to discharge.              -- Patient curently under IVC; plan to d/c 10/24 or 10/25.            -- Discharge Concerns: Need to establish a safety plan; Medication compliance  and effectiveness             -- Discharge Goals: To shelter with outpatient referrals for mental health follow-up including medication management/psychotherapy/substance use treatment  Rosezetta Schlatter, MD PGY-1 10/19/2021 [place] Department of Psychiatry

## 2021-10-19 NOTE — Progress Notes (Signed)
  On assessment, pt presents with moderate anxiety and depression. Pt attended group and snack.  Pt more open today.  Pt communicated the changes made in his medications. Pt denies SI/HI, and verbally contracts for safety.  Pt denies AVH.  Pt is safe on unit with Q 15 minute safety checks.    10/19/21 2200  Psych Admission Type (Psych Patients Only)  Admission Status Involuntary  Psychosocial Assessment  Patient Complaints Anxiety;Depression  Eye Contact Brief;Fair  Facial Expression Flat  Affect Blunted  Speech Soft;Logical/coherent  Interaction Minimal  Motor Activity Other (Comment) (wnl)  Appearance/Hygiene Unremarkable  Behavior Characteristics Cooperative  Mood Depressed;Anxious  Thought Process  Coherency Concrete thinking  Content WDL  Delusions None reported or observed  Perception WDL  Hallucination None reported or observed  Judgment Poor  Confusion None  Danger to Self  Current suicidal ideation? Denies  Danger to Others  Danger to Others None reported or observed  Danger to Others Abnormal  Harmful Behavior to others No threats or harm toward other people  Destructive Behavior No threats or harm toward property

## 2021-10-19 NOTE — [facility] Group Notes (Signed)
Pt did not attend Psychoeducational group. 

## 2021-10-19 NOTE — [facility] Group Notes (Signed)
Did not attend goals group 

## 2021-10-19 NOTE — [facility] Counselor (Signed)
CSW met with pt and discussed his status with disability. CSW gave pt the number for SSI.   Toney Reil, [place] Worker [employer]

## 2021-10-19 NOTE — BH IP Treatment Plan (Signed)
Interdisciplinary Treatment and Diagnostic Plan Update  10/19/2021 Time of Session:  Tyler White MRN: [id number]  Principal Diagnosis: Schizoaffective disorder, bipolar type [facility])  Secondary Diagnoses: Principal Problem:   Schizoaffective disorder, bipolar type ([place]) Active Problems:   Cocaine abuse, continuous use ([place])   Homelessness   Marijuana abuse in remission   Xerosis of skin   Current Medications:  Current Facility-Administered Medications  Medication Dose Route Frequency Provider Last Rate Last Admin   acetaminophen (TYLENOL) tablet 650 mg  650 mg Oral Q6H PRN Starkes-Perry, Gayland Curry, FNP       alum & mag hydroxide-simeth (MAALOX/MYLANTA) 200-200-20 MG/5ML suspension 30 mL  30 mL Oral Q4H PRN Starkes-Perry, Gayland Curry, FNP       ARIPiprazole (ABILIFY) tablet 10 mg  10 mg Oral Daily Rosezetta Schlatter, MD   10 mg at 10/19/21 0830   benztropine (COGENTIN) tablet 0.5 mg  0.5 mg Oral BID Rosezetta Schlatter, MD   0.5 mg at 10/19/21 0830   hydrocerin (EUCERIN) cream   Topical PRN Rosezetta Schlatter, MD       hydrOXYzine (ATARAX/VISTARIL) tablet 25 mg  25 mg Oral TID PRN Rosezetta Schlatter, MD   25 mg at 10/15/21 1640   magnesium hydroxide (MILK OF MAGNESIA) suspension 30 mL  30 mL Oral Daily PRN Starkes-Perry, Gayland Curry, FNP       neomycin-bacitracin-polymyxin (NEOSPORIN) ointment packet   Topical BID Massengill, Ovid Curd, MD   Given at 10/18/21 0910   [START ON 10/22/2021] paliperidone (INVEGA SUSTENNA) injection 156 mg  156 mg Intramuscular Q28 days Harlow Asa, MD       propranolol (INDERAL) tablet 20 mg  20 mg Oral BID Rosezetta Schlatter, MD   20 mg at 10/19/21 0831   risperiDONE (RISPERDAL M-TABS) disintegrating tablet 3 mg  3 mg Oral BID Rosezetta Schlatter, MD   3 mg at 10/19/21 0830   traZODone (DESYREL) tablet 100 mg  100 mg Oral QHS PRN Suella Broad, FNP   100 mg at 10/18/21 2128   PTA Medications: Facility-Administered Medications Prior to Admission  Medication Dose Route  Frequency Provider Last Rate Last Admin   paliperidone (INVEGA SUSTENNA) injection 156 mg  156 mg Intramuscular Q28 days Money, Champion B, FNP       No medications prior to admission.    Patient Stressors: Financial difficulties   Medication change or noncompliance   Occupational concerns   Traumatic event    Patient Strengths: Motivation for treatment/growth  Physical Health   Treatment Modalities: Medication Management, Group therapy, Case management,  1 to 1 session with clinician, Psychoeducation, Recreational therapy.   Physician Treatment Plan for Primary Diagnosis: Schizoaffective disorder, bipolar type ([place]) Long Term Goal(s): Improvement in symptoms so as ready for discharge   Short Term Goals: Ability to identify changes in lifestyle to reduce recurrence of condition will improve Ability to verbalize feelings will improve Ability to disclose and discuss suicidal ideas Ability to demonstrate self-control will improve Compliance with prescribed medications will improve Ability to identify triggers associated with substance abuse/mental health issues will improve  Medication Management: Evaluate patient's response, side effects, and tolerance of medication regimen.  Therapeutic Interventions: 1 to 1 sessions, Unit Group sessions and Medication administration.  Evaluation of Outcomes: Progressing  Physician Treatment Plan for Secondary Diagnosis: Principal Problem:   Schizoaffective disorder, bipolar type ([place]) Active Problems:   Cocaine abuse, continuous use ([place])   Homelessness   Marijuana abuse in remission   Xerosis of skin  Long Term Goal(s): Improvement  in symptoms so as ready for discharge   Short Term Goals: Ability to identify changes in lifestyle to reduce recurrence of condition will improve Ability to verbalize feelings will improve Ability to disclose and discuss suicidal ideas Ability to demonstrate self-control will improve Compliance with  prescribed medications will improve Ability to identify triggers associated with substance abuse/mental health issues will improve     Medication Management: Evaluate patient's response, side effects, and tolerance of medication regimen.  Therapeutic Interventions: 1 to 1 sessions, Unit Group sessions and Medication administration.  Evaluation of Outcomes: Progressing   RN Treatment Plan for Primary Diagnosis: Schizoaffective disorder, bipolar type ([place]) Long Term Goal(s): Knowledge of disease and therapeutic regimen to maintain health will improve  Short Term Goals: Ability to verbalize feelings will improve, Ability to disclose and discuss suicidal ideas, and Ability to identify and develop effective coping behaviors will improve  Medication Management: RN will administer medications as ordered by provider, will assess and evaluate patient's response and provide education to patient for prescribed medication. RN will report any adverse and/or side effects to prescribing provider.  Therapeutic Interventions: 1 on 1 counseling sessions, Psychoeducation, Medication administration, Evaluate responses to treatment, Monitor vital signs and CBGs as ordered, Perform/monitor CIWA, COWS, AIMS and Fall Risk screenings as ordered, Perform wound care treatments as ordered.  Evaluation of Outcomes: Progressing   LCSW Treatment Plan for Primary Diagnosis: Schizoaffective disorder, bipolar type ([place]) Long Term Goal(s): Safe transition to appropriate next level of care at discharge, Engage patient in therapeutic group addressing interpersonal concerns.  Short Term Goals: Engage patient in aftercare planning with referrals and resources, Increase social support, and Increase ability to appropriately verbalize feelings  Therapeutic Interventions: Assess for all discharge needs, 1 to 1 time with Social worker, Explore available resources and support systems, Assess for adequacy in community support network,  Educate family and significant other(s) on suicide prevention, Complete Psychosocial Assessment, Interpersonal group therapy.  Evaluation of Outcomes: Progressing   Progress in Treatment: Attending groups: No. Participating in groups: No. Taking medication as prescribed: Yes. Toleration medication: Yes. Family/Significant other contact made: No, will contact:  unable to contact contact given Patient understands diagnosis: Yes. Discussing patient identified problems/goals with staff: Yes. Medical problems stabilized or resolved: Yes. Denies suicidal/homicidal ideation: Yes. Issues/concerns per patient self-inventory: No. Other: None  New problem(s) identified: No, Describe:  None  New Short Term/Long Term Goal(s):medication stabilization, elimination of SI thoughts, development of comprehensive mental wellness plan.   Patient Goals:  "to go to a shelter."  Discharge Plan or Barriers: Pt is established with f/u at [facility]. Pt is currently in search of shelter for placement after discharge. Pt has been given resources.   Reason for Continuation of Hospitalization: Medication stabilization  Estimated Length of Stay: 3-5 days   Scribe for Treatment Team: Eliott Nine 10/19/2021 10:23 AM

## 2021-10-19 NOTE — Group Note (Signed)
Recreation Therapy Group Note   Group Topic:Stress Management  Group Date: 10/19/2021 Start Time: 0930 End Time: 0945 Facilitators: Victorino Sparrow, LRT/CTRS Location: [address] Dayroom  Goal Area(s) Addresses:  Patient will actively participate in stress management techniques presented during session.  Patient will successfully identify benefit of practicing stress management post d/c.   Group Description: Meditation.  LRT played a meditation that focused on looking at each day as a new beginning to try new things, make a new friend, take time for yourself or make peace with any individual you may have an issue with.  Patients were to listen and follow along as meditation played to engage in activity.   Affect/Mood: N/A   Participation Level: Did not attend    Clinical Observations/Individualized Feedback: Pt did not attend group.    Plan: Continue to engage patient in RT group sessions 2-3x/week.   Victorino Sparrow, LRT/CTRS 10/19/2021 11:41 AM

## 2021-10-19 NOTE — Progress Notes (Signed)
Pt remains hypervigilant, guarded, isolative in room at intervals during shift. Pt did not attend scheduled groups despite multiple prompts. Tolerates meals and medications well without discomfort. Rates his anxiety, depression and hopelessness all 0/10. However, pt remains visibly restless, unable to stand still at medication window on intervals. Per pt "My medications are working. I feel a little better but I'm worried about stuff when I leave here". Denies SI, HI, AVH and pain when assessed. Reports he's sleeping well. Pt is medication compliant, denies adverse drug reactions. Tolerate meals well without discomfort. Q 15 minutes safety checks maintained on and off unit without issue. Support, encouragement and reassurance offered to pt this shift.  Pt remains safe on unit. Denies concerns at this time.

## 2021-10-20 MED ORDER — BACITRACIN-NEOMYCIN-POLYMYXIN 400-5-5000 EX OINT: TOPICAL_OINTMENT | CUTANEOUS | Status: DC | PRN

## 2021-10-20 NOTE — Progress Notes (Addendum)
[facility] MD Progress Note  10/20/2021 7:16 AM Tyler White  MRN:  [id number] Subjective:   Tyler White is a 25 year old male with a psychiatric history of schizoaffective disorder- bipolar type, cocaine use disorder-severe/dependent, alcohol use disorder-mild, and tobacco use disorder-mild who presented to [facility] ED for worsening depression, anxiety, and AH, and was admitted to [facility] for stabilization.   Per chart review, Tyler White has had multiple inpatient psychiatric admissions since 2018, mostly 2/2 substance-induced mood disorder. Tyler White says that his AH began at age 67; collateral from a previous admission stated, "Tyler White states that the patient's issues began when "he got laced" with some substance in high school. She says he has never been the same since and the rocking back and forth and hearing voices began at that time."  On Assessment Today (10/22): Case was discussed in the multidisciplinary team. MAR was reviewed and patient was compliant with medications. Patient seen, assessed, and discussed with attending Dr. Berdine White.  Patient again appeared to have minimal thought blocking throughout assessment today and maintained his improvements with a less guarded, pleasant affect. Patient stated he slept well, and his appetite is intact. The restlessness in his legs has improved with the increased dose of Propranolol, but is still mildly present. Patient did not attend evening groups, but attended the nightly wrap-up group. He is hopeful about obtaining shelter placement next week. Patient denies present SI/HI/AVH and paranoid ideation. He also notes an overall improvement in [facility]; he still has not heard voices since 10/19.  He has tolerated the [place] LAI well, and is aware that he will have his next injection on Monday.   Principal Problem: Schizoaffective disorder, bipolar type ([place]) Diagnosis: Principal Problem:   Schizoaffective disorder, bipolar type ([place]) Active Problems:   Cocaine abuse,  continuous use (HCC)   Homelessness   Marijuana abuse in remission   Xerosis of skin  Total Time spent with patient: I personally spent 35 minutes on the unit in direct patient care. The direct patient care time included face-to-face time with the patient, reviewing the patient's chart, communicating with other professionals, and coordinating care. Greater than 50% of this time was spent in counseling or coordinating care with the patient regarding goals of hospitalization, psycho-education, and discharge planning needs.   Past Psychiatric History: See H&P  Past Medical History:  Past Medical History:  Diagnosis Date   Hernia, inguinal, right    Inguinal hernia    right   Psychiatric illness    Schizophrenia ([place])    History reviewed. No pertinent surgical history. Family History:  Family History  Problem Relation Age of Onset   Psychiatric Illness Mother    Hypertension Sister    Family Psychiatric  History: See H&P Social History:  Social History   Substance and Sexual Activity  Alcohol Use Not Currently     Social History   Substance and Sexual Activity  Drug Use Not Currently   Types: Marijuana, Cocaine   Comment: denies currently    Social History   Socioeconomic History   Marital status: Single    Spouse name: Not on file   Number of children: Not on file   Years of education: 10   Highest education level: Not on file  Occupational History   Not on file  Tobacco Use   Smoking status: Former    Packs/day: 0.50    Years: 5.00    Pack years: 2.50    Types: Cigarettes   Smokeless tobacco: Never  Vaping Use  Vaping Use: Never used  Substance and Sexual Activity   Alcohol use: Not Currently   Drug use: Not Currently    Types: Marijuana, Cocaine    Comment: denies currently   Sexual activity: Yes    Birth control/protection: None  Other Topics Concern   Not on file  Social History Narrative   ** Merged History Encounter **       ** Merged History  Encounter **       Social Determinants of Radio broadcast assistant Strain: Not on file  Food Insecurity: Not on file  Transportation Needs: Not on file  Physical Activity: Not on file  Stress: Not on file  Social Connections: Not on file   Additional Social History:      Sleep: Good  Appetite:  Good  Current Medications: Current Facility-Administered Medications  Medication Dose Route Frequency Provider Last Rate Last Admin   acetaminophen (TYLENOL) tablet 650 mg  650 mg Oral Q6H PRN Starkes-Perry, Gayland Curry, FNP       alum & mag hydroxide-simeth (MAALOX/MYLANTA) 200-200-20 MG/5ML suspension 30 mL  30 mL Oral Q4H PRN Starkes-Perry, Gayland Curry, FNP       ARIPiprazole (ABILIFY) tablet 10 mg  10 mg Oral Daily Rosezetta Schlatter, MD   10 mg at 10/19/21 0830   benztropine (COGENTIN) tablet 0.5 mg  0.5 mg Oral BID Rosezetta Schlatter, MD   0.5 mg at 10/19/21 1647   hydrocerin (EUCERIN) cream   Topical PRN Rosezetta Schlatter, MD       hydrOXYzine (ATARAX/VISTARIL) tablet 25 mg  25 mg Oral TID PRN Rosezetta Schlatter, MD   25 mg at 10/15/21 1640   magnesium hydroxide (MILK OF MAGNESIA) suspension 30 mL  30 mL Oral Daily PRN Starkes-Perry, Gayland Curry, FNP       neomycin-bacitracin-polymyxin (NEOSPORIN) ointment packet   Topical PRN Rosezetta Schlatter, MD       Derrill Memo ON 10/22/2021] paliperidone (INVEGA SUSTENNA) injection 156 mg  156 mg Intramuscular Q28 days Nelda Marseille, Amy E, MD       propranolol ER (INDERAL LA) 24 hr capsule 60 mg  60 mg Oral Daily Massengill, Nathan, MD       risperiDONE (RISPERDAL M-TABS) disintegrating tablet 3 mg  3 mg Oral QHS Massengill, Nathan, MD   3 mg at 10/19/21 2127   traZODone (DESYREL) tablet 50 mg  50 mg Oral QHS PRN Janine Limbo, MD   50 mg at 10/19/21 2128    Lab Results:  No results found for this or any previous visit (from the past 10 hour(s)).   Blood Alcohol level:  Lab Results  Component Value Date   ETH <10 10/07/2021   ETH <10 83/15/1761    Metabolic  Disorder Labs: Lab Results  Component Value Date   HGBA1C 5.0 10/09/2021   MPG 96.8 10/09/2021   MPG 99.67 11/02/2020   Lab Results  Component Value Date   PROLACTIN 32.5 (H) 05/30/2018   Lab Results  Component Value Date   CHOL 172 10/09/2021   TRIG 131 10/09/2021   HDL 50 10/09/2021   CHOLHDL 3.4 10/09/2021   VLDL 26 10/09/2021   LDLCALC 96 10/09/2021   LDLCALC 68 11/02/2020    Physical Findings: AIMS: Facial and Oral Movements Muscles of Facial Expression: None, normal Lips and Perioral Area: None, normal Jaw: None, normal Tongue: None, normal,Extremity Movements Upper (arms, wrists, hands, fingers): None, normal Lower (legs, knees, ankles, toes): Moderate, Trunk Movements Neck, shoulders, hips: None, normal, Overall Severity Severity  of abnormal movements (highest score from questions above): Moderate Incapacitation due to abnormal movements: None, normal Patient's awareness of abnormal movements (rate only patient's report): Aware, no distress, Dental Status Current problems with teeth and/or dentures?: No Does patient usually wear dentures?: No  CIWA:  CIWA-Ar Total: 1  Musculoskeletal: Strength & Muscle Tone: within normal limits Gait & Station: normal, steady Patient leans: N/A  Psychiatric Specialty Exam:  Presentation  General Appearance: Appropriate for Environment; Casual  Eye Contact:Good  Speech: Mostly clear; less periods of speech latency, but still present  Speech Volume:Normal  Handedness:Right   Mood and Affect  Mood: "I'm okay"  Affect:appears less guarded, pleasant   Thought Process  Thought Processes:Concrete, vague, but improving  Orientation: oriented to self, year, and month  Thought Content: Denies SI/HI/AVH, first-rank sx, and ideas of reference.  Made no delusional statements today nor paranoid ideation.  Has less periods of speech latency and thought blocking suggestive of internal preoccupation, but still  present.  History of Schizophrenia/Schizoaffective disorder:Yes  Duration of Psychotic Symptoms:Greater than six months  Hallucinations:Denies  Ideas of Reference:Paranoia  Suicidal Thoughts:Denied  Homicidal Thoughts:Denied   Sensorium  Memory: Fair  Judgment:Intact  Insight:Present Improving  Executive Functions  Concentration:Fair  Attention Span:Fair  [place]   Psychomotor Activity  Psychomotor Activity:EPS, restlessness/akathisia   Assets  Assets:Desire for Improvement; Physical Health; Resilience   Sleep  Number of Hours of Sleep: 4.5   Physical Exam Vitals and nursing note reviewed.  Constitutional:      General: He is not in acute distress.    Appearance: Normal appearance.  HENT:     Head: Normocephalic and atraumatic.  Pulmonary:     Effort: Pulmonary effort is normal.  Musculoskeletal:        General: Normal range of motion.     Comments: No tremors, no cogwheeling; AIMS 0  Skin:    General: Skin is warm and dry.     Findings: No lesion.     Comments: Lesion on forehead healed appropriately  Neurological:     General: No focal deficit present.     Mental Status: He is alert.     Gait: Gait normal.   Review of Systems  Constitutional: Negative.   Respiratory:  Negative for shortness of breath.   Cardiovascular:  Negative for chest pain.  Gastrointestinal:  Negative for abdominal pain, constipation, diarrhea and nausea.  Genitourinary: Negative.   Neurological:  Negative for tremors and headaches.  Blood pressure 117/74, pulse 89, temperature 98 F (36.7 C), temperature source Oral, resp. rate 16, height 5\' 9"  (1.753 m), weight 67.1 kg, SpO2 99 %. Body mass index is 21.86 kg/m.   Treatment Plan Summary: Shia is a 25 year old male with a history of schizoaffective disorder-bipolar type, cocaine use disorder, and multiple past inpatient psychiatric admissions who was admitted for  worsening depression and auditory hallucinations after being released from jail.  Patient denies thought blocking, thought broadcasting, and hallucinations - adding Abilify as an adjunct.  May need to switch antipsychotics or consider transition to a typical agent if he does not continue to improve.   Daily contact with patient to assess and evaluate symptoms and progress in treatment and Medication management  Schizoaffective disorder- BP type -Continue Risperdal 3 mg qHS; received LAI Invega Sustenna 234 mg IM 10/21; will receive 156 mg LAI on 10/24. -Continue Cogentin 0.5 mg twice daily for EPS and monitor AIMS  Increase to Propranolol LA- 24 hr 60 mg  daily today for akathisia.  -Continue Abilify 10 mg  - Continue room lockout during meals and groups  - Has Trazodone PRN for insomnia and encouraged improved sleep hygiene during the day - EKG QTC 435 - monitoring on present Risperdal dose   Cocaine Use disorder- continuous use Marijuana use disorder- in remission No withdrawal symptoms experienced at this time - Advised discontinuation of drug use.  We will offer patient substance use treatment options.   Medical Management Covid negative CMP: Unremarkable CBC: unremarkable EtOH: <10 UDS: Positive cocaine TSH: 1.675 A1C: 5.0% Lipids: Unremarkable   Xerosis of skin Itching in bilateral antecubital fossae, did not appear eczematous -Ordered Eucerin cream BID as needed.  Discharge Planning:              -- Social work and case management to assist with discharge planning and identification of hospital follow-up needs prior to discharge.              -- Patient curently under IVC; plan to d/c 10/24 or 10/25.            -- Discharge Concerns: Need to establish a safety plan; Medication compliance and effectiveness             -- Discharge Goals: To shelter with outpatient referrals for mental health follow-up including medication management/psychotherapy/substance use  treatment  Rosezetta Schlatter, MD PGY-1 10/20/2021 [facility] Department of Psychiatry

## 2021-10-20 NOTE — Progress Notes (Signed)
  Pt presents with moderate anxiety and somewhat restless while at medication window.  Pt is minimal but will talk when questions are asked. Pt shared that he played basketball today. Pt denies SI/HI and verbally contracts for safety.  Pt denies VH. Pt reports AH but states, "they are slowly going away."  Administered PRN Trazodone per [facility] per pt request.  Pt remains safe on unit with Q 15 minute safety checks.    10/20/21 2110  Psych Admission Type (Psych Patients Only)  Admission Status Involuntary  Psychosocial Assessment  Patient Complaints Anxiety  Eye Contact Brief;Fair  Facial Expression Flat  Affect Blunted  Speech Soft;Logical/coherent  Interaction Minimal  Motor Activity Other (Comment) (wnl)  Appearance/Hygiene Unremarkable  Behavior Characteristics Cooperative  Mood Depressed;Anxious  Thought Process  Coherency Concrete thinking  Content WDL  Delusions None reported or observed  Perception WDL  Hallucination None reported or observed  Judgment Poor  Confusion None  Danger to Self  Current suicidal ideation? Denies  Danger to Others  Danger to Others None reported or observed  Danger to Others Abnormal  Harmful Behavior to others No threats or harm toward other people  Destructive Behavior No threats or harm toward property

## 2021-10-20 NOTE — Group Note (Signed)
LCSW Group Therapy Note  No therapy group could be held this day due to staffing issues.  Another licensed group was held.  Selmer Dominion, LCSW 10/20/2021 11:40 AM

## 2021-10-20 NOTE — [facility] Group Notes (Signed)
.  Psychoeducational Group Note  Date: 10/20/2021 Time: 0900-1000    Goal Setting   Purpose of Group: This group helps to provide patients with the steps of setting a goal that is specific, measurable, attainable, realistic and time specific. A discussion on how we keep ourselves stuck with negative self talk.    Participation Level:  Did not attend   Paulino Rily

## 2021-10-20 NOTE — Plan of Care (Signed)
  Problem: Self-Concept: Goal: Level of anxiety will decrease Outcome: Progressing Goal: Ability to modify response to factors that promote anxiety will improve Outcome: Progressing   Problem: Education: Goal: Knowledge of the prescribed therapeutic regimen will improve Outcome: Progressing

## 2021-10-20 NOTE — [facility] Group Notes (Signed)
.  Psychoeducational Group Note    Date:10/20/2021 Time: 1300-1400    Purpose of Group: . The group focus' on teaching patients on how to identify their needs and how Life Skills:  A group where two lists are made. What people need and what are things that we do that are healthy. The lists are developed by the patients and it is explained that we often do the actions that are not healthy to get our list of needs met.  to develop the coping skills needed to get their needs met  Participation Level:  Did not attend   Paulino Rily

## 2021-10-20 NOTE — Progress Notes (Signed)
[place] Group Notes:  (Nursing/MHT/Case Management/Adjunct)  Date:  10/20/2021  Time:  2015  Type of Therapy:   wrap up group  Participation Level:  Active  Participation Quality:  Appropriate, Attentive, Sharing, and Supportive  Affect:  Flat  Cognitive:  Alert  Insight:  Improving  Engagement in Group:  Engaged  Modes of Intervention:  Clarification, Education, and Support  Summary of Progress/Problems: Positive thinking and positive change were discussed.   Shellia Cleverly 10/20/2021, 8:47 PM

## 2021-10-20 NOTE — Progress Notes (Signed)
Pt did not attend group. 

## 2021-10-21 NOTE — Progress Notes (Signed)
[place] Group Notes:  (Nursing/MHT/Case Management/Adjunct)  Date:  10/21/2021  Time:  2015 Type of Therapy:   wrap up group  Participation Level:  Active  Participation Quality:  Appropriate, Attentive, Sharing, and Supportive  Affect:  Flat  Cognitive:  Alert  Insight:  Improving  Engagement in Group:  Engaged  Modes of Intervention:  Clarification, Education, and Support  Summary of Progress/Problems: Positive thinking and self-care were discussed.   Shellia Cleverly 10/21/2021, 8:54 PM

## 2021-10-21 NOTE — Progress Notes (Signed)
D:  Patient's self inventory sheet, patient sleeps good, medication helpful.  Good appetite, normal energy level, good concentration.  Denied anxiety, hopeless, depression.  Denied SI.  Then sometimes SI.  Contracts for safety.  Denied physical problems.  Denied physical pain.  Goal is IM tomorrow.  Plans to talk to staff.  No discharge plans. A:   Medications administered per MD orders.  Emotional support and encouragement given patient. R:  Denied SI and HI, contracts for safety.  Denied A/V hallucinations.  Safety maintained with 15 minute checks.

## 2021-10-21 NOTE — [facility] Group Notes (Signed)
Adult Psychoeducational Group Not Date:  10/21/2021 Time:  0900-1045 Group Topic/Focus: PROGRESSIVE RELAXATION. A group where deep breathing is taught and tensing and relaxation muscle groups is used. Imagery is used as well.  Pts are asked to imagine 3 pillars that hold them up when they are not able to hold themselves up.  Participation Level:  did not attend   Paulino Rily

## 2021-10-21 NOTE — Plan of Care (Signed)
  Problem: Self-Concept: Goal: Level of anxiety will decrease Outcome: Progressing   Problem: Education: Goal: Knowledge of the prescribed therapeutic regimen will improve Outcome: Progressing   Problem: Activity: Goal: Interest or engagement in leisure activities will improve Outcome: Progressing
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
## 2021-10-21 NOTE — Plan of Care (Signed)
Nurse discussed anxiety, depression and coping skills with patient.  

## 2021-10-21 NOTE — [facility] Group Notes (Signed)
Psychoeducational Group Note  Date: 510-23-22 Time:  1300  Group Topic/Focus:  Making Healthy Choices:   The focus of this group is to help patients identify negative/unhealthy choices they were using prior to admission and identify positive/healthier coping strategies to replace them upon discharge.In this group, patients started asking about the brain and how the brain works with and how the chemicals work for those who use substances, the pros and cons of saboxone.  Participation Level:  Did not attend   Tyler White

## 2021-10-22 ENCOUNTER — Other Ambulatory Visit (HOSPITAL_COMMUNITY): Payer: Self-pay

## 2021-10-22 NOTE — Progress Notes (Signed)
   10/22/21 2209  Psych Admission Type (Psych Patients Only)  Admission Status Involuntary  Psychosocial Assessment  Patient Complaints None  Eye Contact Brief;Fair  Facial Expression Flat;Pensive  Affect Appropriate to circumstance  Speech Soft;Logical/coherent  Interaction Minimal  Motor Activity Other (Comment) (WDL)  Appearance/Hygiene Unremarkable  Behavior Characteristics Cooperative;Appropriate to situation  Mood Depressed  Thought Process  Coherency Concrete thinking  Content WDL  Delusions None reported or observed  Perception WDL  Hallucination None reported or observed  Judgment Poor  Confusion None  Danger to Self  Current suicidal ideation? Denies  Danger to Others  Danger to Others None reported or observed  Danger to Others Abnormal  Harmful Behavior to others No threats or harm toward other people  Destructive Behavior No threats or harm toward property

## 2021-10-22 NOTE — Group Note (Signed)
LCSW Group Therapy Note   Group Date: 10/22/2021 Start Time: 1300 End Time: 1400   Type of Therapy and Topic:  Group Therapy:   Participation Level:  Did Not Attend  Description of Group: In this process group, patients discussed using strengths to work toward goals and address challenges.  Patients identified two positive things about themselves and one goal they were working on.  Patients were given the opportunity to share openly and support each other's plan for self-empowerment.  The group discussed the value of gratitude and were encouraged to have a daily reflection of positive characteristics or circumstances.  Patients were encouraged to identify a plan to utilize their strengths to work on current challenges and goals.   Therapeutic Goals Patient will verbalize personal strengths/positive qualities and relate how these can assist with achieving desired personal goals Patients will verbalize affirmation of peers plans for personal change and goal setting Patients will explore the value of gratitude and positive focus as related to successful achievement of goals Patients will verbalize a plan for regular reinforcement of personal positive qualities and circumstances.   Summary of Patient Progress:  Did not attend      Therapeutic Modalities Cognitive Behavioral Therapy Motivational Interviewing  Darleen Crocker, [place] 10/22/2021  1:57 PM

## 2021-10-22 NOTE — Progress Notes (Signed)
[facility] MD Progress Note  10/22/2021 7:30 AM Tyler White  MRN:  [id number] Subjective:   Tyler White is a 25 year old male with a psychiatric history of schizoaffective disorder- bipolar type, cocaine use disorder-severe/dependent, alcohol use disorder-mild, and tobacco use disorder-mild who presented to [facility] ED for worsening depression, anxiety, and AH, and was admitted to [facility] for stabilization.   Per chart review, Tyler White has had multiple inpatient psychiatric admissions since 2018, mostly 2/2 substance-induced mood disorder. Tyler White says that his AH began at age 27; collateral from a previous admission stated, "Tyler White states that the patient's issues began when "he got laced" with some substance in high school. She says he has never been the same since and the rocking back and forth and hearing voices began at that time."  Chart Review, 24 hr Events: The patient's chart was reviewed and nursing notes were reviewed. The patient's case was discussed in multidisciplinary team meeting.  Per MAR: - Patient is compliant with scheduled meds. - PRNs: trazodone x1 Per RN notes, no documented behavioral issues and is attending group. Patient slept 5 hours  Patient had the following psychiatric recommendations yesterday: -Continue Risperdal 3 mg qHS; received LAI Invega Sustenna 234 mg IM 10/21; will receive 156 mg LAI on 10/24. -Continue Cogentin 0.5 mg twice daily for EPS and monitor AIMS         Continue Propranolol LA- 24 hr 60 mg daily for akathisia.  -Continue Abilify 10 mg  - Continue room lockout during meals and groups  - Has Trazodone PRN for insomnia and encouraged improved sleep hygiene during the day - EKG QTC 435 - monitoring on present Risperdal dose  On Assessment Today (10/24): Case was discussed in the multidisciplinary team. MAR was reviewed and patient was compliant with medications. Patient seen, assessed, and discussed with attending Dr. Caswell Corwin.  Patient again appeared to have  minimal thought blocking throughout assessment today and maintained his improvements with a less guarded, pleasant affect. Patient stated he slept well, and his appetite is intact. The restlessness in his legs has improved with the increased dose of Propranolol. Patient has been trying to attend groups as many as he can and participating appropriately. He is hopeful about obtaining shelter placement but anxious about what is to come as "he doesn't have anyone in [place], and especially not in [place]." Patient denies present SI/HI/AVH and paranoid ideation. He has not endorsed AH since 10/19.  He has tolerated the [place] LAI well, and is aware that he will have his next injection today.   Principal Problem: Schizoaffective disorder, bipolar type ([place]) Diagnosis: Principal Problem:   Schizoaffective disorder, bipolar type ([place]) Active Problems:   Cocaine abuse, continuous use ([facility])   Homelessness   Marijuana abuse in remission   Xerosis of skin  Total Time spent with patient: I personally spent 35 minutes on the unit in direct patient care. The direct patient care time included face-to-face time with the patient, reviewing the patient's chart, communicating with other professionals, and coordinating care. Greater than 50% of this time was spent in counseling or coordinating care with the patient regarding goals of hospitalization, psycho-education, and discharge planning needs.   Past Psychiatric History: See H&P  Past Medical History:  Past Medical History:  Diagnosis Date   Hernia, inguinal, right    Inguinal hernia    right   Psychiatric illness    Schizophrenia ([place])    History reviewed. No pertinent surgical history. Family History:  Family History  Problem Relation  Age of Onset   Psychiatric Illness Mother    Hypertension Sister    Family Psychiatric  History: See H&P Social History:  Social History   Substance and Sexual Activity  Alcohol Use Not Currently      Social History   Substance and Sexual Activity  Drug Use Not Currently   Types: Marijuana, Cocaine   Comment: denies currently    Social History   Socioeconomic History   Marital status: Single    Spouse name: Not on file   Number of children: Not on file   Years of education: 10   Highest education level: Not on file  Occupational History   Not on file  Tobacco Use   Smoking status: Former    Packs/day: 0.50    Years: 5.00    Pack years: 2.50    Types: Cigarettes   Smokeless tobacco: Never  Vaping Use   Vaping Use: Never used  Substance and Sexual Activity   Alcohol use: Not Currently   Drug use: Not Currently    Types: Marijuana, Cocaine    Comment: denies currently   Sexual activity: Yes    Birth control/protection: None  Other Topics Concern   Not on file  Social History Narrative   ** Merged History Encounter **       ** Merged History Encounter **       Social Determinants of Health   Financial Resource Strain: Not on file  Food Insecurity: Not on file  Transportation Needs: Not on file  Physical Activity: Not on file  Stress: Not on file  Social Connections: Not on file   Additional Social History:      Sleep: Good  Appetite:  Good  Current Medications: Current Facility-Administered Medications  Medication Dose Route Frequency Provider Last Rate Last Admin   acetaminophen (TYLENOL) tablet 650 mg  650 mg Oral Q6H PRN Starkes-Perry, Gayland Curry, FNP       alum & mag hydroxide-simeth (MAALOX/MYLANTA) 200-200-20 MG/5ML suspension 30 mL  30 mL Oral Q4H PRN Starkes-Perry, Gayland Curry, FNP       ARIPiprazole (ABILIFY) tablet 10 mg  10 mg Oral Daily Rosezetta Schlatter, MD   10 mg at 10/21/21 9163   benztropine (COGENTIN) tablet 0.5 mg  0.5 mg Oral BID Rosezetta Schlatter, MD   0.5 mg at 10/21/21 1632   hydrocerin (EUCERIN) cream   Topical PRN Rosezetta Schlatter, MD       hydrOXYzine (ATARAX/VISTARIL) tablet 25 mg  25 mg Oral TID PRN Rosezetta Schlatter, MD   25 mg at  10/21/21 1436   magnesium hydroxide (MILK OF MAGNESIA) suspension 30 mL  30 mL Oral Daily PRN Starkes-Perry, Gayland Curry, FNP       neomycin-bacitracin-polymyxin (NEOSPORIN) ointment packet   Topical PRN Rosezetta Schlatter, MD       paliperidone (INVEGA SUSTENNA) injection 156 mg  156 mg Intramuscular Q28 days Singleton, Amy E, MD       propranolol ER (INDERAL LA) 24 hr capsule 60 mg  60 mg Oral Daily Massengill, Nathan, MD   60 mg at 10/21/21 8466   risperiDONE (RISPERDAL M-TABS) disintegrating tablet 3 mg  3 mg Oral QHS Massengill, Nathan, MD   3 mg at 10/21/21 2122   traZODone (DESYREL) tablet 50 mg  50 mg Oral QHS PRN Janine Limbo, MD   50 mg at 10/21/21 2122    Lab Results:  No results found for this or any previous visit (from the past 72 hour(s)).  Blood Alcohol level:  Lab Results  Component Value Date   ETH <10 10/07/2021   ETH <10 48/25/0037    Metabolic Disorder Labs: Lab Results  Component Value Date   HGBA1C 5.0 10/09/2021   MPG 96.8 10/09/2021   MPG 99.67 11/02/2020   Lab Results  Component Value Date   PROLACTIN 32.5 (H) 05/30/2018   Lab Results  Component Value Date   CHOL 172 10/09/2021   TRIG 131 10/09/2021   HDL 50 10/09/2021   CHOLHDL 3.4 10/09/2021   VLDL 26 10/09/2021   LDLCALC 96 10/09/2021   LDLCALC 68 11/02/2020    Physical Findings: AIMS: Facial and Oral Movements Muscles of Facial Expression: None, normal Lips and Perioral Area: None, normal Jaw: None, normal Tongue: None, normal,Extremity Movements Upper (arms, wrists, hands, fingers): None, normal Lower (legs, knees, ankles, toes): Moderate, Trunk Movements Neck, shoulders, hips: None, normal, Overall Severity Severity of abnormal movements (highest score from questions above): Moderate Incapacitation due to abnormal movements: None, normal Patient's awareness of abnormal movements (rate only patient's report): Aware, no distress, Dental Status Current problems with teeth and/or  dentures?: No Does patient usually wear dentures?: No  CIWA:  CIWA-Ar Total: 1  Musculoskeletal: Strength & Muscle Tone: within normal limits Gait & Station: normal, steady Patient leans: N/A  Psychiatric Specialty Exam:  Presentation  General Appearance: Appropriate for Environment; Casual  Eye Contact:Good  Speech: clear without speech latency  Speech Volume:Normal  Handedness:Right   Mood and Affect  Mood: "I'm okay"  Affect:appears less guarded, pleasant   Thought Process  Thought Processes:Linear  Orientation: oriented to self, year, and month  Thought Content: Denies SI/HI/AVH, first-rank sx, and ideas of reference.  Made no delusional statements today nor paranoid ideation.  No longer has periods of speech latency nor thought blocking suggestive of internal preoccupation, but still present.  History of Schizophrenia/Schizoaffective disorder:Yes  Duration of Psychotic Symptoms:Greater than six months  Hallucinations:Denies  Ideas of Reference:Paranoia  Suicidal Thoughts:Denied  Homicidal Thoughts:Denied   Sensorium  Memory: Fair  Judgment:Intact  Insight:Present Improving  Executive Functions  Concentration:Fair  Attention Span:Fair  [place]   Psychomotor Activity  Psychomotor Activity:EPS, restlessness/akathisia   Assets  Assets:Desire for Improvement; Physical Health; Resilience   Sleep  Number of Hours of Sleep: 5   Physical Exam Vitals and nursing note reviewed.  Constitutional:      General: He is not in acute distress.    Appearance: Normal appearance.  HENT:     Head: Normocephalic and atraumatic.  Pulmonary:     Effort: Pulmonary effort is normal.  Musculoskeletal:        General: Normal range of motion.     Comments: No tremors, no cogwheeling; AIMS 0  Skin:    General: Skin is warm and dry.     Findings: No lesion.     Comments: Lesion on forehead healed  appropriately  Neurological:     General: No focal deficit present.     Mental Status: He is alert.     Gait: Gait normal.   Review of Systems  Constitutional: Negative.   Respiratory:  Negative for shortness of breath.   Cardiovascular:  Negative for chest pain.  Gastrointestinal:  Negative for abdominal pain, constipation, diarrhea and nausea.  Genitourinary: Negative.   Neurological:  Negative for tremors and headaches.  Blood pressure 107/80, pulse (!) 104, temperature 97.9 F (36.6 C), resp. rate 16, height 5\' 9"  (1.753 m), weight 67.1 kg, SpO2  98 %. Body mass index is 21.86 kg/m.   Treatment Plan Summary: Khaza is a 25 year old male with a history of schizoaffective disorder-bipolar type, cocaine use disorder, and multiple past inpatient psychiatric admissions who was admitted for worsening depression and auditory hallucinations after being released from jail.  Patient denies thought blocking, thought broadcasting, and hallucinations - adding Abilify as an adjunct.  May need to switch antipsychotics or consider transition to a typical agent if he does not continue to improve.  Patient eager for discharge and feels that his medication regimen has been appropriate.  Daily contact with patient to assess and evaluate symptoms and progress in treatment and Medication management  Schizoaffective disorder- BP type -Continue Risperdal 3 mg qHS; received LAI Invega Sustenna 234 mg IM 10/21; will receive 156 mg LAI on today. -Continue Cogentin 0.5 mg twice daily for EPS and monitor AIMS  Propranolol LA- 24 hr 60 mg daily for akathisia.  -Continue Abilify 10 mg  - Continue room lockout during meals and groups  - Has Trazodone PRN for insomnia and encouraged improved sleep hygiene during the day - EKG QTC 435 - monitoring on present Risperdal dose   Cocaine Use disorder- continuous use Marijuana use disorder- in remission No withdrawal symptoms experienced at this time - Advised  discontinuation of drug use.  We will offer patient substance use treatment options.   Medical Management Covid negative CMP: Unremarkable CBC: unremarkable EtOH: <10 UDS: Positive cocaine TSH: 1.675 A1C: 5.0% Lipids: Unremarkable   Xerosis of skin Itching in bilateral antecubital fossae, did not appear eczematous -Ordered Eucerin cream BID as needed.  Discharge Planning:              -- Social work and case management to assist with discharge planning and identification of hospital follow-up needs prior to discharge.              -- Patient curently under IVC; plan to d/c 10/25.            -- Discharge Concerns: Need to establish a safety plan; Medication compliance and effectiveness             -- Discharge Goals: To shelter with outpatient referrals for mental health follow-up including medication management/psychotherapy/substance use treatment  Rosezetta Schlatter, MD PGY-1 10/22/2021 [place] Department of Psychiatry

## 2021-10-22 NOTE — [facility] Group Notes (Signed)
Adult Psychoeducational Group Note  Date:  10/22/2021 Time:  8:53 AM  Group Topic/Focus:  Goals Group:   The focus of this group is to help patients establish daily goals to achieve during treatment and discuss how the patient can incorporate goal setting into their daily lives to aide in recovery.  Participation Level:  Did Not Attend    Dub Mikes 10/22/2021, 8:53 AM

## 2021-10-22 NOTE — Progress Notes (Signed)
D:  Patient's self inventory sheet, patient sleeps good, sleep medication helpful.  Good appetite, normal energy level, good concentration.  Denied depression, hopeless and anxiety.  Denied withdrawals.  Denied SI.  Denied physical problems.  Denied physical pain.  Denied  pain medicine.  Goal is invega injection.  Plans to talk to nurse, MD, SW.  No discharge plans. A:  Medications administered per MD orders.  Emotional support and encouragement given patient. R:  Patient denied SI and HI, contracts for safety.  Denied A/V hallucinations.  Safety maintained with 15 minute checks.

## 2021-10-22 NOTE — [facility] Group Notes (Signed)
Pt did not attend group.     The focus of this group is to help patients review their daily goal of treatment and discuss progress on daily workbooks.

## 2021-10-22 NOTE — Group Note (Deleted)
Recreation Therapy Group Note   Group Topic:Stress Management  Group Date: 10/22/2021 Start Time: 0935 End Time: 0950 Facilitators: Vikki Ports, NT Location: [address] Dayroom       Affect/Mood: {RT [facility] Affect/Mood:26271}   Participation Level: {RT [facility] Participation [employer]   Participation Quality: {RT [facility] Participation Quality:26268}   Behavior: {RT [facility] Group Behavior:26269}   Speech/Thought Process: {RT [facility] Speech/Thought:26276}   Insight: {RT [facility] Insight:26272}   Judgement: {RT [facility] Judgement:26278}   Modes of Intervention: {RT [facility] Modes of Intervention:26277}   Patient Response to Interventions:  {RT [facility] Patient Response to Intervention:26274}   Education Outcome:  {RT [place] Education Outcome:26279}   Clinical Observations/Individualized Feedback: *** was *** in their participation of session activities and group discussion. Pt identified ***   Plan: {RT [facility] Tx UVOZ:36644}   Vikki Ports, NT,  10/22/2021 12:49 PM

## 2021-10-22 NOTE — Plan of Care (Signed)
Nurse discussed anxiety, depression and coping skills with patient.  

## 2021-10-22 NOTE — Group Note (Signed)
Recreation Therapy Group Note   Group Topic:Stress Management  Group Date: 10/22/2021 Start Time: 0935 End Time: 5189 Facilitators: Victorino Sparrow, LRT/CTRS Location: [address] Dayroom  Goal Area(s) Addresses:  Patient will identify positive stress management techniques. Patient will identify benefits of using stress management post d/c.  Group Description:  Meditation.  LRT played a meditation that focused on having patience when dealing with situations beyond our control.  Patients were to sit and focus as the meditation played to fully engage.   Affect/Mood: Flat   Participation Level: Active   Participation Quality: Independent   Behavior: Appropriate   Speech/Thought Process: Focused   Insight: Good   Judgement: Good   Modes of Intervention: Meditation   Patient Response to Interventions:  Attentive   Education Outcome:  Acknowledges education and In group clarification offered    Clinical Observations/Individualized Feedback: Pt attended and participated in group.    Plan: Continue to engage patient in RT group sessions 2-3x/week.   Victorino Sparrow, LRT/CTRS 10/22/2021 1:09 PM

## 2021-10-22 NOTE — [facility] Group Notes (Signed)
Patient did not attend group.    Spiritual care group on grief and loss facilitated by chaplain Katy Jehan Ranganathan, [facility]   Group Goal:   Support / Education around grief and loss   Members engage in facilitated group support and psycho-social education.   Group Description:   Following introductions and group rules, group members engaged in facilitated group dialog and support around topic of loss, with particular support around experiences of loss in their lives. Group Identified types of loss (relationships / self / things) and identified patterns, circumstances, and changes that precipitate losses. Reflected on thoughts / feelings around loss, normalized grief responses, and recognized variety in grief experience. Group noted Worden's four tasks of grief in discussion.   Group drew on Adlerian / Rogerian, narrative, MI,    

## 2021-10-23 MED ORDER — HYDROXYZINE HCL 25 MG PO TABS: 25.0000 mg | ORAL_TABLET | Freq: Three times a day (TID) | ORAL | 0 refills | Status: DC | PRN

## 2021-10-23 MED ORDER — RISPERIDONE 3 MG PO TBDP: 3.0000 mg | ORAL_TABLET | Freq: Every day | ORAL | 0 refills | Status: DC

## 2021-10-23 MED ORDER — BENZTROPINE MESYLATE 0.5 MG PO TABS: 0.5000 mg | ORAL_TABLET | Freq: Two times a day (BID) | ORAL | 0 refills | Status: DC

## 2021-10-23 MED ORDER — TRAZODONE HCL 50 MG PO TABS: 50.0000 mg | ORAL_TABLET | Freq: Every evening | ORAL | 0 refills | Status: DC | PRN

## 2021-10-23 MED ORDER — PROPRANOLOL HCL ER 60 MG PO CP24: 60.0000 mg | ORAL_CAPSULE | Freq: Every day | ORAL | 0 refills | Status: DC

## 2021-10-23 MED ORDER — ARIPIPRAZOLE 10 MG PO TABS: 10.0000 mg | ORAL_TABLET | Freq: Every day | ORAL | 0 refills | Status: DC

## 2021-10-23 MED ORDER — PALIPERIDONE PALMITATE ER 156 MG/ML IM SUSY: 156.0000 mg | PREFILLED_SYRINGE | INTRAMUSCULAR | 0 refills | Status: DC

## 2021-10-23 NOTE — Progress Notes (Signed)
   10/23/21 0628  Vital Signs  Temp 97.8 F (36.6 C)  Pulse Rate 84  Pulse Rate Source Monitor  BP 126/71  BP Location Right Arm  BP Method Automatic  Patient Position (if appropriate) Standing  Oxygen Therapy  SpO2 100 %   D: Patient denies SI/HI/AVH. Patient denies both anxiety and depression. A:  Patient took scheduled medicine.  Support and encouragement provided Routine safety checks conducted every 15 minutes. Patient  Informed to notify staff with any concerns.   R:  Safety maintained.

## 2021-10-23 NOTE — Group Note (Signed)
Group Topic: Goal Setting  Group Date: 10/23/2021 Start Time: 0830 End Time: 0900 Facilitators: Loney Loh, NT  Department: [facility]  Number of Participants: 14  Group Focus: activities of daily living skills and goals/reality orientation  Name: Tyler White Date of Birth: 08/02/1996  MR: [id number]    Level of Participation: Pt did attend goals/orientation group.

## 2021-10-23 NOTE — [facility] Suicide Risk Assessment (Signed)
[facility] Discharge Suicide Risk Assessment   Principal Problem: Schizoaffective disorder, bipolar type ([place]) Discharge Diagnoses: Principal Problem:   Schizoaffective disorder, bipolar type ([place]) Active Problems:   Cocaine abuse, continuous use ([place])   Homelessness   Marijuana abuse in remission   Xerosis of skin   Total Time spent with patient: 9 minutes  25 year old male with a psychiatric history of schizoaffective bipolar type was admitted to the psychiatric unit for ideation treatment of suicidal thoughts, command auditory hallucinations.   During the patient's hospitalization, patient had extensive initial psychiatric evaluation, and follow-up psychiatric evaluations every day. Patient's psychiatric medications were adjusted on admission: Risperdal was started During the hospitalization, other adjustments were made to the patient's psychiatric medication regimen:  Risperdal was titrated to 6 mg daily.  Invega LAI was administered.  Risperdal dose was decreased to 3 mg once daily, and patient will take p.o. Risperdal for 5 days following discharge. Abilify was started and increased to current dose of 10 mg once daily, which profoundly helped the patient's residual psychotic symptoms that did not respond to Risperdal, and helped with treating negative symptoms. Cogentin was started for prophylactic treatment of EPS. Propranolol was started for akathisia, and this was effective.  Patient's care was discussed during the interdisciplinary team meeting every day during the hospitalization. The patient reported akathisia as Risperdal dose was increased.  Patient reports akathisia responded well to propranolol.  On the day of discharge, the patient denies having symptoms of akathisia, and he denies having other side effects to prescribed psychiatric medication.  The patient reports their target psychiatric symptoms of psychosis and command auditory hallucinations, responded well to the  psychiatric medications, and the patient reports overall benefit other psychiatric hospitalization.   Labs were reviewed with the patient, and abnormal results were discussed with the patient. The patient denied having suicidal thoughts more than 48 hours prior to discharge.  Patient denies having homicidal thoughts.  Patient denies having auditory hallucinations.  Patient denies any visual hallucinations.  Patient denies having paranoid thoughts. The patient is able to verbalize their individual safety plan to this provider. It is recommended to the patient to continue psychiatric medications as prescribed, after discharge from the hospital.   It is recommended to the patient to follow up with your outpatient psychiatric provider and PCP. Discussed with the patient, the impact of alcohol, drugs, tobacco have been there overall psychiatric and medical wellbeing, and abstaining from substance use was recommended the patient.   Musculoskeletal: Strength & Muscle Tone: within normal limits Gait & Station: normal Patient leans: N/A  Psychiatric Specialty Exam  Presentation  General Appearance: Appropriate for Environment; Casual; Fairly Groomed  Eye Contact:Good  Speech:Clear and Coherent; Normal Rate  Speech Volume:Normal  Handedness:Right   Mood and Affect  Mood:Anxious; Euthymic  Duration of Depression Symptoms: Greater than two weeks  Affect:Appropriate; Constricted   Thought Process  Thought Processes:Coherent; Linear  Descriptions of Associations:Intact  Orientation:Full (Time, Place and Person)  Thought Content:Logical  History of Schizophrenia/Schizoaffective disorder:Yes  Duration of Psychotic Symptoms:Greater than six months  Hallucinations:Hallucinations: None  Ideas of Reference:None  Suicidal Thoughts:Suicidal Thoughts: No  Homicidal Thoughts:Homicidal Thoughts: No   Sensorium  Memory:Immediate Good; Recent Good; Remote  Good  Judgment:Good  Insight:Good   Executive Functions  Concentration:Fair  Attention Span:Fair  [place]  Language:Good   Psychomotor Activity  Psychomotor Activity:Psychomotor Activity: Normal AIMS Completed?: Yes (AIMS score zero today 10-23-2021)   Assets  Assets:Desire for Improvement; Physical Health; Resilience  Sleep  Sleep:Sleep: Good   Physical Exam: Physical Exam see discharge summary ROS see discharge summary  Blood pressure 121/78, pulse 87, temperature 97.8 F (36.6 C), resp. rate 16, height 5\' 9"  (1.753 m), weight 67.1 kg, SpO2 100 %. Body mass index is 21.86 kg/m.  Mental Status Per Nursing Assessment::   On Admission:  Suicidal ideation indicated by patient  Demographic factors:  Living alone, Unemployed, Adolescent or young adult, Low socioeconomic status Loss Factors:  Financial problems / change in socioeconomic status, Decline in physical health Historical Factors:  Impulsivity, history of psychotic illness Risk Reduction Factors:  Positive coping skills or problem solving skills  Continued Clinical Symptoms:  Schizophrenia:   Less than 66 years old  Cognitive Features That Contribute To Risk:  Thought blocking present when acutely psychotic.  Thought blocking is much less, when treated, as an day of discharge.  Suicide Risk:  Mild:   There are no identifiable suicide plans, no associated intent, mild dysphoria and related symptoms, good self-control (both objective and subjective assessment), few other risk factors, and identifiable protective factors, including available and accessible social support.   Follow-up Information     [facility] Follow up.   Why: You may contact this facility for assistance with your disability benefits and homelessness. Contact information: [address] [place], [place] [address] [phone]        Services, [facility] Follow up.   Why: You may go to  this facility for therapy and medication management services. Contact information: [place] [address] [phone]                 Plan Of Care/Follow-up recommendations:   Activity: as tolerated  Diet: heart healthy  Other: -Follow-up with your outpatient psychiatric provider -instructions on appointment date, time, and address (location) are provided to you in discharge paperwork. -Take your psychiatric medications as prescribed at discharge - instructions are provided to you in the discharge paperwork -Follow-up with outpatient primary care doctor and other specialists -for management of chronic medical disease and preventive medicine.  -Testing: Follow-up with outpatient provider for abnormal lab results: None   -Recommend abstinence from alcohol, tobacco, and other illicit drug use at discharge.  -If your psychiatric symptoms recur, worsening, or if you have side effects to your psychiatric medications, call your outpatient psychiatric provider, 911, [phone] or go to the nearest emergency department. -If suicidal thoughts recur, call your outpatient psychiatric provider, 911, [phone] or go to the nearest emergency department.   Christoper Allegra, MD 10/23/2021, 10:20 AM
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
## 2021-10-23 NOTE — Progress Notes (Signed)
[facility] Adult Case Management Discharge Plan :  Will you be returning to the same living situation after discharge:  No. At discharge, do you have transportation home?: Yes,  Safe Transport Do you have the ability to pay for your medications: Yes,  Medicaid  Release of information consent forms completed and in the chart;  Patient's signature needed at discharge.  Patient to Follow up at:  Follow-up Information     [facility] Follow up.   Why: You may contact this facility for assistance with your disability benefits and homelessness. Contact information: [address], [place], [place] [address] [phone]        [facility] Follow up.   Why: You may go to this facility for therapy and medication management services. Contact information: [place] [address] [phone]                 Next level of care provider has access to [place] and Suicide Prevention discussed: Yes,  w/ pt  Has patient been referred to the Quitline?: Patient refused referral  Patient has been referred for addiction treatment: N/A  Mliss Fritz, Latanya Presser 10/23/2021, 9:45 AM

## 2021-10-27 ENCOUNTER — Emergency Department (HOSPITAL_COMMUNITY)
Admission: EM | Admit: 2021-10-27 | Discharge: 2021-10-27 | Disposition: A | Discharge status: Home / Self Care | Payer: Medicaid Other | Attending: Emergency Medicine | Admitting: Emergency Medicine

## 2021-10-27 ENCOUNTER — Encounter (HOSPITAL_COMMUNITY): Payer: Self-pay | Admitting: Emergency Medicine

## 2021-10-27 ENCOUNTER — Other Ambulatory Visit: Payer: Self-pay

## 2021-10-27 DIAGNOSIS — M62830 Muscle spasm of back: Secondary | ICD-10-CM | POA: Insufficient documentation

## 2021-10-27 DIAGNOSIS — Z87891 Personal history of nicotine dependence: Secondary | ICD-10-CM | POA: Insufficient documentation

## 2021-10-27 DIAGNOSIS — M545 Low back pain, unspecified: Secondary | ICD-10-CM | POA: Diagnosis present

## 2021-10-27 DIAGNOSIS — R8279 Other abnormal findings on microbiological examination of urine: Secondary | ICD-10-CM | POA: Insufficient documentation

## 2021-10-27 LAB — URINALYSIS, ROUTINE W REFLEX MICROSCOPIC
Bilirubin Urine: NEGATIVE
Glucose, UA: NEGATIVE mg/dL
Hgb urine dipstick: NEGATIVE
Ketones, ur: 20 mg/dL — AB
Leukocytes,Ua: NEGATIVE
Nitrite: NEGATIVE
Protein, ur: NEGATIVE mg/dL
Specific Gravity, Urine: 1.014 (ref 1.005–1.030)
pH: 6 (ref 5.0–8.0)

## 2021-10-27 MED ORDER — CYCLOBENZAPRINE HCL 10 MG PO TABS: 10.0000 mg | ORAL_TABLET | Freq: Two times a day (BID) | ORAL | 0 refills | Status: DC | PRN

## 2021-10-27 MED ORDER — IBUPROFEN 600 MG PO TABS: 600.0000 mg | ORAL_TABLET | Freq: Four times a day (QID) | ORAL | 0 refills | Status: DC | PRN

## 2021-10-27 MED ORDER — CYCLOBENZAPRINE HCL 10 MG PO TABS
5.0000 mg | ORAL_TABLET | Freq: Once | ORAL | Status: AC
  Administered 2021-10-27: 5 mg via ORAL
  Filled 2021-10-27: qty 1

## 2021-10-27 MED ORDER — IBUPROFEN 400 MG PO TABS
600.0000 mg | ORAL_TABLET | Freq: Once | ORAL | Status: AC
  Administered 2021-10-27: 600 mg via ORAL
  Filled 2021-10-27: qty 1

## 2021-10-27 MED ORDER — ACETAMINOPHEN 325 MG PO TABS
650.0000 mg | ORAL_TABLET | Freq: Once | ORAL | Status: AC
  Administered 2021-10-27: 650 mg via ORAL
  Filled 2021-10-27: qty 2

## 2021-10-27 NOTE — ED Triage Notes (Addendum)
C/o lower back pain since yesterday.  Denies injury.  Reports he hasn't urinated since yesterday.

## 2021-10-27 NOTE — ED Provider Notes (Signed)
[place] EMERGENCY DEPARTMENT Provider Note   CSN: [id number] Arrival date & time: 10/27/21  1420     History Chief Complaint  Patient presents with   Back Pain    Tyler White is a 25 y.o. male.  The history is provided by the patient and medical records. No language interpreter was used.  Back Pain Associated symptoms: no fever and no numbness    25 year old male significant history of schizophrenia who presents complaining of back pain.  Patient report for the past 2 weeks he has had recurrent lower back spasm.  Spasm sometimes radiates down to both of his legs.  Earlier in the day he did mention having trouble urinating but now he is able to urinate without difficulty.  He did admits to heavy lifting recently.  He denies any fever chills abdominal pain dysuria or hematuria.  Reports symptoms mild at this time.  No recent change in medication.  Patient is homeless  Past Medical History:  Diagnosis Date   Hernia, inguinal, right    Inguinal hernia    right   Psychiatric illness    Schizophrenia [facility])     Patient Active Problem List   Diagnosis Date Noted   Xerosis of skin 10/09/2021   Schizoaffective disorder ([place]) 10/08/2021   Schizoaffective disorder, bipolar type ([place]) 04/26/2021   Marijuana abuse in remission 04/26/2021   Polysubstance abuse ([place]) 04/23/2021   Homelessness 04/23/2021   Tobacco abuse 01/15/2021   Schizophrenia ([place]) 11/19/2020   Cannabis abuse 04/28/2020   Cocaine abuse ([place]) 04/28/2020   Cocaine abuse, continuous use ([place]) 02/19/2018   Cocaine abuse with cocaine-induced mood disorder ([place]) 02/19/2018   Psychosis ([place]) 11/27/2017   Cannabis abuse with psychotic disorder, with delusions ([place]) 11/26/2017   Schizophrenia, unspecified ([place]) 11/26/2017   Right inguinal hernia 04/06/2013    History reviewed. No pertinent surgical history.     Family History  Problem Relation Age of Onset   Psychiatric Illness Mother     Hypertension Sister     Social History   Tobacco Use   Smoking status: Former    Packs/day: 0.50    Years: 5.00    Pack years: 2.50    Types: Cigarettes   Smokeless tobacco: Never  Vaping Use   Vaping Use: Never used  Substance Use Topics   Alcohol use: Not Currently   Drug use: Not Currently    Types: Marijuana, Cocaine    Comment: denies currently    Home Medications Prior to Admission medications   Medication Sig Start Date End Date Taking? Authorizing Provider  ARIPiprazole (ABILIFY) 10 MG tablet Take 1 tablet (10 mg total) by mouth daily. 10/24/21   Massengill, Ovid Curd, MD  benztropine (COGENTIN) 0.5 MG tablet Take 1 tablet (0.5 mg total) by mouth 2 (two) times daily. 10/23/21   Massengill, Ovid Curd, MD  hydrOXYzine (ATARAX/VISTARIL) 25 MG tablet Take 1 tablet (25 mg total) by mouth 3 (three) times daily as needed for up to 30 doses for anxiety. 10/23/21   Massengill, Ovid Curd, MD  paliperidone (INVEGA SUSTENNA) 156 MG/ML SUSY injection Inject 1 mL (156 mg total) into the muscle every 28 (twenty-eight) days. 11/19/21   Massengill, Ovid Curd, MD  propranolol ER (INDERAL LA) 60 MG 24 hr capsule Take 1 capsule (60 mg total) by mouth daily. 10/24/21   Massengill, Ovid Curd, MD  risperiDONE (RISPERDAL M-TABS) 3 MG disintegrating tablet Take 1 tablet (3 mg total) by mouth at bedtime. 10/23/21   Janine Limbo, MD  traZODone ([place])  50 MG tablet Take 1 tablet (50 mg total) by mouth at bedtime as needed for sleep. 10/23/21   Massengill, Ovid Curd, MD  gabapentin (NEURONTIN) 400 MG capsule Take 1 capsule (400 mg total) by mouth 3 (three) times daily. Patient not taking: Reported on 06/09/2021 04/30/21 06/10/21  Ethelene Hal, NP    Allergies    Patient has no known allergies.  Review of Systems   Review of Systems  Constitutional:  Negative for fever.  Musculoskeletal:  Positive for back pain.  Skin:  Negative for wound.  Neurological:  Negative for numbness.   Physical  Exam Updated Vital Signs BP 126/77 (BP Location: Right Arm)   Pulse (!) 110   Temp 98.4 F (36.9 C) (Oral)   Resp 18   SpO2 97%   Physical Exam Vitals and nursing note reviewed.  Constitutional:      General: He is not in acute distress.    Appearance: He is well-developed.  HENT:     Head: Atraumatic.  Eyes:     Conjunctiva/sclera: Conjunctivae normal.  Pulmonary:     Effort: Pulmonary effort is normal.  Abdominal:     General: There is no distension.     Palpations: Abdomen is soft.     Tenderness: There is no abdominal tenderness.  Musculoskeletal:        General: No tenderness (No significant midline spine tenderness crepitus or step-off). Normal range of motion.     Cervical back: Neck supple.     Comments: 5 out of 5 strength to bilateral lower extremities with intact distal pedal pulses.  Skin:    Findings: No rash.  Neurological:     Mental Status: He is alert.  Psychiatric:        Mood and Affect: Mood normal.    ED Results / Procedures / Treatments   Labs (all labs ordered are listed, but only abnormal results are displayed) Labs Reviewed  URINALYSIS, ROUTINE W REFLEX MICROSCOPIC - Abnormal; Notable for the following components:      Result Value   Ketones, ur 20 (*)    All other components within normal limits  URINE CULTURE    EKG None  Radiology No results found.  Procedures Procedures   Medications Ordered in ED Medications  ibuprofen (ADVIL) tablet 600 mg (600 mg Oral Given 10/27/21 2133)  acetaminophen (TYLENOL) tablet 650 mg (650 mg Oral Given 10/27/21 2133)  cyclobenzaprine (FLEXERIL) tablet 5 mg (5 mg Oral Given 10/27/21 2133)    ED Course  I have reviewed the triage vital signs and the nursing notes.  Pertinent labs & imaging results that were available during my care of the patient were reviewed by me and considered in my medical decision making (see chart for details).    MDM Rules/Calculators/A&P                            BP 130/73 (BP Location: Right Arm)   Pulse 79   Temp 97.7 F (36.5 C) (Oral)   Resp 18   SpO2 99%   Final Clinical Impression(s) / ED Diagnoses Final diagnoses:  Muscle spasm of back    Rx / DC Orders ED Discharge Orders          Ordered    cyclobenzaprine (FLEXERIL) 10 MG tablet  2 times daily PRN        10/27/21 2300    ibuprofen (ADVIL) 600 MG tablet  Every 6 hours PRN  10/27/21 2300           9:15 PM Patient endorsed lower back spasm for the past 2 weeks.  Initially mention that he had some difficulty urinating however he has since able to urinate and no other complaint.  Exam unremarkable, he is neurovascular intact.  UA without signs of urine tract infection.  Able to ambulate without difficulty.  Low suspicion for cauda equina or other concerning medical condition.  I also have low suspicion for anticholinergic syndrome causing urinary retention.  Will prescribe muscle relaxant, return precaution given.    [name], PA-C 10/27/21 2337    Lacretia Leigh, MD 10/30/21 [phone]

## 2021-10-27 NOTE — ED Notes (Signed)
Bus pass provided.

## 2021-10-27 NOTE — ED Provider Notes (Signed)
Emergency Medicine Provider Triage Evaluation Note  Tyler White , a 25 y.o. male  was evaluated in triage.  Pt complains of back spasms with some radiating numbness and tingling down both legs since yesterday. Patient endorses urgency, hasn't been able to pee since yesterday despite drinking water. Reports pain is 10/10, has not taken anything for it. Requests hydrocodone. No saddle anesthesia, no trauma.  Review of Systems  Positive: Urinary urgency, back pain Negative: As above  Physical Exam  BP 126/77 (BP Location: Right Arm)   Pulse (!) 110   Temp 98.4 F (36.9 C) (Oral)   Resp 18   SpO2 97%  Gen:   Awake, no distress, flat affect Resp:  Normal effort  MSK:   Moves extremities without difficulty, 5/5 strength bil LE Other:  No TTP of abdomen  Medical Decision Making  Medically screening exam initiated at 3:00 PM.  Appropriate orders placed.  Tyler White was informed that the remainder of the evaluation will be completed by another provider, this initial triage assessment does not replace that evaluation, and the importance of remaining in the ED until their evaluation is complete.  Urinary urgency, back pain Spoke with Dr. Sherry Ruffing about urinary symptoms -- discussed we would have to catheterize him if he was unable to pee, now patient reports he should be able to. Bladder scan 165-159mL   Tyler White 10/27/21 1504    Tegeler, Gwenyth Allegra, MD 10/27/21 2022

## 2021-10-29 LAB — URINE CULTURE: Culture: <10000 — AB

## 2021-11-14 ENCOUNTER — Other Ambulatory Visit (HOSPITAL_BASED_OUTPATIENT_CLINIC_OR_DEPARTMENT_OTHER): Payer: Self-pay

## 2021-11-29 ENCOUNTER — Encounter (HOSPITAL_COMMUNITY): Payer: Self-pay | Admitting: Emergency Medicine

## 2021-11-29 ENCOUNTER — Other Ambulatory Visit: Payer: Self-pay

## 2021-11-29 ENCOUNTER — Emergency Department (HOSPITAL_COMMUNITY)
Admission: EM | Admit: 2021-11-29 | Discharge: 2021-11-30 | Disposition: A | Discharge status: Home / Self Care | Payer: Medicaid Other | Attending: Emergency Medicine | Admitting: Emergency Medicine

## 2021-11-29 DIAGNOSIS — F1910 Other psychoactive substance abuse, uncomplicated: Secondary | ICD-10-CM | POA: Diagnosis not present

## 2021-11-29 DIAGNOSIS — Z87891 Personal history of nicotine dependence: Secondary | ICD-10-CM | POA: Diagnosis not present

## 2021-11-29 LAB — RAPID URINE DRUG SCREEN, HOSP PERFORMED
Amphetamines: NOT DETECTED
Barbiturates: NOT DETECTED
Benzodiazepines: NOT DETECTED
Cocaine: POSITIVE — AB
Opiates: NOT DETECTED
Tetrahydrocannabinol: POSITIVE — AB

## 2021-11-29 NOTE — ED Triage Notes (Signed)
Pt BIB [facility], states he used weed and cocaine tonight and now "feels weird". EMS VSS.

## 2021-11-30 NOTE — ED Provider Notes (Signed)
[facility] EMERGENCY DEPARTMENT Provider Note   CSN: [id number] Arrival date & time: 11/29/21  2141     History Chief Complaint  Patient presents with   Addiction Problem    Grantley Savage is a 25 y.o. male.  25 yo M with a cc of feeling uncomfortable after doing illegal drugs earlier today.  Tells me that he did cocaine and marijuana from a supplier that he does not typically use.  Felt his throat was burning up.  Has progressively felt better since waiting in the waiting room.  Has been able to eat and drink without issue.  Feels like he is good to go.  Denies any chest pain or difficulty breathing.  The history is provided by the patient.  Illness Severity:  Moderate Onset quality:  Gradual Duration:  12 hours Timing:  Constant Progression:  Worsening Chronicity:  New Associated symptoms: sore throat   Associated symptoms: no abdominal pain, no chest pain, no congestion, no diarrhea, no fever, no headaches, no myalgias, no rash, no shortness of breath and no vomiting       Past Medical History:  Diagnosis Date   Hernia, inguinal, right    Inguinal hernia    right   Psychiatric illness    Schizophrenia [facility])     Patient Active Problem List   Diagnosis Date Noted   Xerosis of skin 10/09/2021   Schizoaffective disorder ([place]) 10/08/2021   Schizoaffective disorder, bipolar type ([place]) 04/26/2021   Marijuana abuse in remission 04/26/2021   Polysubstance abuse ([place]) 04/23/2021   Homelessness 04/23/2021   Tobacco abuse 01/15/2021   Schizophrenia ([place]) 11/19/2020   Cannabis abuse 04/28/2020   Cocaine abuse ([place]) 04/28/2020   Cocaine abuse, continuous use ([place]) 02/19/2018   Cocaine abuse with cocaine-induced mood disorder ([place]) 02/19/2018   Psychosis ([place]) 11/27/2017   Cannabis abuse with psychotic disorder, with delusions ([place]) 11/26/2017   Schizophrenia, unspecified ([place]) 11/26/2017   Right inguinal hernia 04/06/2013    History reviewed. No  pertinent surgical history.     Family History  Problem Relation Age of Onset   Psychiatric Illness Mother    Hypertension Sister     Social History   Tobacco Use   Smoking status: Former    Packs/day: 0.50    Years: 5.00    Pack years: 2.50    Types: Cigarettes   Smokeless tobacco: Never  Vaping Use   Vaping Use: Never used  Substance Use Topics   Alcohol use: Not Currently   Drug use: Not Currently    Types: Marijuana, Cocaine    Comment: denies currently    Home Medications Prior to Admission medications   Medication Sig Start Date End Date Taking? Authorizing Provider  ARIPiprazole (ABILIFY) 10 MG tablet Take 1 tablet (10 mg total) by mouth daily. 10/24/21   Massengill, Ovid Curd, MD  benztropine (COGENTIN) 0.5 MG tablet Take 1 tablet (0.5 mg total) by mouth 2 (two) times daily. 10/23/21   Massengill, Ovid Curd, MD  cyclobenzaprine (FLEXERIL) 10 MG tablet Take 1 tablet (10 mg total) by mouth 2 (two) times daily as needed for muscle spasms. 10/27/21   [name], PA-C  hydrOXYzine (ATARAX/VISTARIL) 25 MG tablet Take 1 tablet (25 mg total) by mouth 3 (three) times daily as needed for up to 30 doses for anxiety. 10/23/21   Massengill, Ovid Curd, MD  ibuprofen (ADVIL) 600 MG tablet Take 1 tablet (600 mg total) by mouth every 6 (six) hours as needed for moderate pain. 10/27/21  [name], PA-C  paliperidone (INVEGA SUSTENNA) 156 MG/ML SUSY injection Inject 1 mL (156 mg total) into the muscle every 28 (twenty-eight) days. 11/19/21   Massengill, Ovid Curd, MD  propranolol ER (INDERAL LA) 60 MG 24 hr capsule Take 1 capsule (60 mg total) by mouth daily. 10/24/21   Massengill, Ovid Curd, MD  risperiDONE (RISPERDAL M-TABS) 3 MG disintegrating tablet Take 1 tablet (3 mg total) by mouth at bedtime. 10/23/21   Massengill, Ovid Curd, MD  traZODone (DESYREL) 50 MG tablet Take 1 tablet (50 mg total) by mouth at bedtime as needed for sleep. 10/23/21   Massengill, Ovid Curd, MD  gabapentin (NEURONTIN) 400 MG  capsule Take 1 capsule (400 mg total) by mouth 3 (three) times daily. Patient not taking: Reported on 06/09/2021 04/30/21 06/10/21  Ethelene Hal, NP    Allergies    Patient has no known allergies.  Review of Systems   Review of Systems  Constitutional:  Negative for chills and fever.  HENT:  Positive for sore throat. Negative for congestion and facial swelling.   Eyes:  Negative for discharge and visual disturbance.  Respiratory:  Negative for shortness of breath.   Cardiovascular:  Negative for chest pain and palpitations.  Gastrointestinal:  Negative for abdominal pain, diarrhea and vomiting.  Musculoskeletal:  Negative for arthralgias and myalgias.  Skin:  Negative for color change and rash.  Neurological:  Negative for tremors, syncope and headaches.  Psychiatric/Behavioral:  Negative for confusion and dysphoric mood.    Physical Exam Updated Vital Signs BP 110/70 (BP Location: Right Arm)   Pulse 65   Temp 98.5 F (36.9 C) (Oral)   Resp 16   SpO2 98%   Physical Exam Vitals and nursing note reviewed.  Constitutional:      Appearance: He is well-developed.  HENT:     Head: Normocephalic and atraumatic.  Eyes:     Pupils: Pupils are equal, round, and reactive to light.  Neck:     Vascular: No JVD.  Cardiovascular:     Rate and Rhythm: Normal rate and regular rhythm.     Heart sounds: No murmur heard.   No friction rub. No gallop.  Pulmonary:     Effort: No respiratory distress.     Breath sounds: No wheezing.  Abdominal:     General: There is no distension.     Tenderness: There is no abdominal tenderness. There is no guarding or rebound.  Musculoskeletal:        General: Normal range of motion.     Cervical back: Normal range of motion and neck supple.  Skin:    Coloration: Skin is not pale.     Findings: No rash.  Neurological:     Mental Status: He is alert and oriented to person, place, and time.  Psychiatric:        Behavior: Behavior normal.     ED Results / Procedures / Treatments   Labs (all labs ordered are listed, but only abnormal results are displayed) Labs Reviewed  RAPID URINE DRUG SCREEN, HOSP PERFORMED - Abnormal; Notable for the following components:      Result Value   Cocaine POSITIVE (*)    Tetrahydrocannabinol POSITIVE (*)    All other components within normal limits    EKG None  Radiology No results found.  Procedures Procedures   Medications Ordered in ED Medications - No data to display  ED Course  I have reviewed the triage vital signs and the nursing notes.  Pertinent labs &  imaging results that were available during my care of the patient were reviewed by me and considered in my medical decision making (see chart for details).    MDM Rules/Calculators/A&P                           25 yo M with a chief complaints of illegal drug ingestion.  Tells me that it made him feel uncomfortable.  Has felt progressively better since then.  No chest pain no trouble breathing.  He has been able to tolerate by mouth here without issue.  He is requesting to be discharged home.  We will have him follow-up with his family doctor.  3:13 PM:  I have discussed the diagnosis/risks/treatment options with the patient and believe the pt to be eligible for discharge home to follow-up with PCP. We also discussed returning to the ED immediately if new or worsening sx occur. We discussed the sx which are most concerning (e.g., sudden worsening pain, fever, inability to tolerate by mouth) that necessitate immediate return. Medications administered to the patient during their visit and any new prescriptions provided to the patient are listed below.  Medications given during this visit Medications - No data to display   The patient appears reasonably screen and/or stabilized for discharge and I doubt any other medical condition or other [facility] requiring further screening, evaluation, or treatment in the ED at this time prior  to discharge.   Final Clinical Impression(s) / ED Diagnoses Final diagnoses:  Polysubstance abuse [facility])    Rx / DC Orders ED Discharge Orders     None        Deno Etienne, DO 11/30/21 1513

## 2021-11-30 NOTE — ED Notes (Signed)
Pt verbalized understanding of d/c instructions, meds and followup care. Denies questions. VSS, no distress noted. Steady gait to exit with all belongings.  ?

## 2021-11-30 NOTE — Discharge Instructions (Signed)
Illegal drugs are not regulated by the FDA and therefore cannot have anything in them.  Be careful when you try and ingest any of these illicit substances as it could kill you or apparently leave you disabled.  Please follow-up with your family doctor in the office.  Please return for any worsening symptoms.

## 2021-12-12 ENCOUNTER — Other Ambulatory Visit: Payer: Self-pay | Admitting: Registered Nurse

## 2021-12-12 ENCOUNTER — Inpatient Hospital Stay (HOSPITAL_COMMUNITY)
Admission: AD | Admit: 2021-12-12 | Discharge: 2021-12-16 | DRG: 885 | Disposition: A | Discharge status: Home / Self Care | Payer: Medicaid Other | Source: Intra-hospital | Attending: Psychiatry | Admitting: Psychiatry

## 2021-12-12 ENCOUNTER — Emergency Department (HOSPITAL_COMMUNITY)
Admission: EM | Admit: 2021-12-12 | Discharge: 2021-12-12 | Disposition: A | Discharge status: Other Acute Inpatient Hospital | Payer: Medicaid Other | Source: Home / Self Care | Attending: Emergency Medicine | Admitting: Emergency Medicine

## 2021-12-12 ENCOUNTER — Encounter (HOSPITAL_COMMUNITY): Payer: Self-pay | Admitting: Registered Nurse

## 2021-12-12 ENCOUNTER — Other Ambulatory Visit: Payer: Self-pay

## 2021-12-12 DIAGNOSIS — Z20822 Contact with and (suspected) exposure to covid-19: Secondary | ICD-10-CM | POA: Diagnosis present

## 2021-12-12 DIAGNOSIS — F1210 Cannabis abuse, uncomplicated: Secondary | ICD-10-CM | POA: Diagnosis present

## 2021-12-12 DIAGNOSIS — F250 Schizoaffective disorder, bipolar type: Principal | ICD-10-CM | POA: Diagnosis present

## 2021-12-12 DIAGNOSIS — Z79899 Other long term (current) drug therapy: Secondary | ICD-10-CM

## 2021-12-12 DIAGNOSIS — Z87891 Personal history of nicotine dependence: Secondary | ICD-10-CM | POA: Insufficient documentation

## 2021-12-12 DIAGNOSIS — Y900 Blood alcohol level of less than 20 mg/100 ml: Secondary | ICD-10-CM | POA: Insufficient documentation

## 2021-12-12 DIAGNOSIS — R030 Elevated blood-pressure reading, without diagnosis of hypertension: Secondary | ICD-10-CM | POA: Diagnosis present

## 2021-12-12 DIAGNOSIS — R45851 Suicidal ideations: Secondary | ICD-10-CM | POA: Diagnosis present

## 2021-12-12 DIAGNOSIS — F1994 Other psychoactive substance use, unspecified with psychoactive substance-induced mood disorder: Secondary | ICD-10-CM | POA: Insufficient documentation

## 2021-12-12 DIAGNOSIS — F1410 Cocaine abuse, uncomplicated: Secondary | ICD-10-CM | POA: Diagnosis present

## 2021-12-12 DIAGNOSIS — Z5900 Homelessness unspecified: Secondary | ICD-10-CM | POA: Diagnosis not present

## 2021-12-12 DIAGNOSIS — Z720 Tobacco use: Secondary | ICD-10-CM | POA: Diagnosis present

## 2021-12-12 LAB — CBC WITH DIFFERENTIAL/PLATELET
Abs Immature Granulocytes: 0.02 10*3/uL (ref 0.00–0.07)
Basophils Absolute: 0.1 10*3/uL (ref 0.0–0.1)
Basophils Relative: 1 %
Eosinophils Absolute: 0.1 10*3/uL (ref 0.0–0.5)
Eosinophils Relative: 1 %
HCT: 43.9 % (ref 39.0–52.0)
Hemoglobin: 14.3 g/dL (ref 13.0–17.0)
Immature Granulocytes: 0 %
Lymphocytes Relative: 27 %
Lymphs Abs: 2.3 10*3/uL (ref 0.7–4.0)
MCH: 27.3 pg (ref 26.0–34.0)
MCHC: 32.6 g/dL (ref 30.0–36.0)
MCV: 83.9 fL (ref 80.0–100.0)
Monocytes Absolute: 0.6 10*3/uL (ref 0.1–1.0)
Monocytes Relative: 7 %
Neutro Abs: 5.7 10*3/uL (ref 1.7–7.7)
Neutrophils Relative %: 64 %
Platelets: 288 10*3/uL (ref 150–400)
RBC: 5.23 MIL/uL (ref 4.22–5.81)
RDW: 13.2 % (ref 11.5–15.5)
WBC: 8.7 10*3/uL (ref 4.0–10.5)
nRBC: 0 % (ref 0.0–0.2)

## 2021-12-12 LAB — COMPREHENSIVE METABOLIC PANEL
ALT: 15 U/L (ref 0–44)
AST: 30 U/L (ref 15–41)
Albumin: 4 g/dL (ref 3.5–5.0)
Alkaline Phosphatase: 48 U/L (ref 38–126)
Anion gap: 10 (ref 5–15)
BUN: 16 mg/dL (ref 6–20)
CO2: 26 mmol/L (ref 22–32)
Calcium: 9.4 mg/dL (ref 8.9–10.3)
Chloride: 104 mmol/L (ref 98–111)
Creatinine, Ser: 1.08 mg/dL (ref 0.61–1.24)
GFR, Estimated: >60 mL/min (ref >60)
Glucose, Bld: 111 mg/dL — ABNORMAL HIGH (ref 70–99)
Potassium: 3.5 mmol/L (ref 3.5–5.1)
Sodium: 140 mmol/L (ref 135–145)
Total Bilirubin: 0.6 mg/dL (ref 0.3–1.2)
Total Protein: 6.8 g/dL (ref 6.5–8.1)

## 2021-12-12 LAB — RAPID URINE DRUG SCREEN, HOSP PERFORMED
Amphetamines: NOT DETECTED
Barbiturates: NOT DETECTED
Benzodiazepines: NOT DETECTED
Cocaine: POSITIVE — AB
Opiates: NOT DETECTED
Tetrahydrocannabinol: POSITIVE — AB

## 2021-12-12 LAB — RESP PANEL BY RT-PCR (FLU A&B, COVID) ARPGX2
Influenza A by PCR: NEGATIVE
Influenza B by PCR: NEGATIVE
SARS Coronavirus 2 by RT PCR: NEGATIVE

## 2021-12-12 LAB — SALICYLATE LEVEL: Salicylate Lvl: <7 mg/dL — ABNORMAL LOW (ref 7.0–30.0)

## 2021-12-12 LAB — ACETAMINOPHEN LEVEL: Acetaminophen (Tylenol), Serum: <10 ug/mL — ABNORMAL LOW (ref 10–30)

## 2021-12-12 LAB — ETHANOL: Alcohol, Ethyl (B): <10 mg/dL (ref <10)

## 2021-12-12 MED ORDER — ARIPIPRAZOLE 10 MG PO TABS
10.0000 mg | ORAL_TABLET | Freq: Every day | ORAL | Status: DC
  Administered 2021-12-13: 10 mg via ORAL
  Filled 2021-12-12 (×2): qty 1

## 2021-12-12 MED ORDER — TRAZODONE HCL 50 MG PO TABS
50.0000 mg | ORAL_TABLET | Freq: Every evening | ORAL | Status: DC | PRN
  Administered 2021-12-13 – 2021-12-15 (×2): 50 mg via ORAL
  Filled 2021-12-12 (×2): qty 1

## 2021-12-12 MED ORDER — ALUM & MAG HYDROXIDE-SIMETH 200-200-20 MG/5ML PO SUSP: 30.0000 mL | ORAL | Status: DC | PRN

## 2021-12-12 MED ORDER — PROPRANOLOL HCL ER 60 MG PO CP24: 60.0000 mg | ORAL_CAPSULE | Freq: Every day | ORAL | Status: DC

## 2021-12-12 MED ORDER — RISPERIDONE 1 MG PO TBDP
3.0000 mg | ORAL_TABLET | Freq: Every day | ORAL | Status: DC
  Filled 2021-12-12: qty 3

## 2021-12-12 MED ORDER — TRAZODONE HCL 50 MG PO TABS: 50.0000 mg | ORAL_TABLET | Freq: Every evening | ORAL | Status: DC | PRN

## 2021-12-12 MED ORDER — PROPRANOLOL HCL ER 60 MG PO CP24
60.0000 mg | ORAL_CAPSULE | Freq: Every day | ORAL | Status: DC
  Administered 2021-12-12: 16:00:00 60 mg via ORAL
  Filled 2021-12-12: qty 1

## 2021-12-12 MED ORDER — BENZTROPINE MESYLATE 0.5 MG PO TABS
0.5000 mg | ORAL_TABLET | Freq: Two times a day (BID) | ORAL | Status: DC
  Administered 2021-12-12: 16:00:00 0.5 mg via ORAL
  Filled 2021-12-12 (×2): qty 1

## 2021-12-12 MED ORDER — BENZTROPINE MESYLATE 0.5 MG PO TABS: 0.5000 mg | ORAL_TABLET | Freq: Two times a day (BID) | ORAL | Status: DC

## 2021-12-12 MED ORDER — BENZTROPINE MESYLATE 0.5 MG PO TABS
0.5000 mg | ORAL_TABLET | Freq: Two times a day (BID) | ORAL | Status: DC
  Administered 2021-12-12 – 2021-12-16 (×8): 0.5 mg via ORAL
  Filled 2021-12-12 (×5): qty 1
  Filled 2021-12-12: qty 14
  Filled 2021-12-12: qty 1
  Filled 2021-12-12: qty 14
  Filled 2021-12-12 (×5): qty 1

## 2021-12-12 MED ORDER — RISPERIDONE 3 MG PO TBDP
3.0000 mg | ORAL_TABLET | Freq: Every day | ORAL | Status: DC
  Administered 2021-12-12: 22:00:00 3 mg via ORAL
  Filled 2021-12-12 (×3): qty 1

## 2021-12-12 MED ORDER — ACETAMINOPHEN 325 MG PO TABS: 650.0000 mg | ORAL_TABLET | Freq: Four times a day (QID) | ORAL | Status: DC | PRN

## 2021-12-12 MED ORDER — RISPERIDONE 1 MG PO TBDP: 3.0000 mg | ORAL_TABLET | Freq: Every day | ORAL | Status: DC

## 2021-12-12 MED ORDER — HYDROXYZINE HCL 25 MG PO TABS: 25.0000 mg | ORAL_TABLET | Freq: Three times a day (TID) | ORAL | Status: DC | PRN

## 2021-12-12 MED ORDER — MAGNESIUM HYDROXIDE 400 MG/5ML PO SUSP: 30.0000 mL | Freq: Every day | ORAL | Status: DC | PRN

## 2021-12-12 MED ORDER — HYDROXYZINE HCL 25 MG PO TABS
25.0000 mg | ORAL_TABLET | Freq: Three times a day (TID) | ORAL | Status: DC | PRN
  Administered 2021-12-15 (×2): 25 mg via ORAL
  Filled 2021-12-12 (×2): qty 1

## 2021-12-12 MED ORDER — ARIPIPRAZOLE 10 MG PO TABS
10.0000 mg | ORAL_TABLET | Freq: Every day | ORAL | Status: DC
  Administered 2021-12-12: 16:00:00 10 mg via ORAL
  Filled 2021-12-12: qty 1

## 2021-12-12 NOTE — ED Provider Notes (Signed)
Patient transferring by safe transport to [facility], here voluntarily, not under IVC.  Okay for transfer   Wyvonnia Dusky, MD 12/12/21 [phone]

## 2021-12-12 NOTE — ED Notes (Signed)
Verbal report called to [facility] at this time Tyler White

## 2021-12-12 NOTE — ED Notes (Signed)
Patient with sitter in consultation room for TTS

## 2021-12-12 NOTE — ED Notes (Signed)
Patient calm and cooperative in room at this time. Cooperative with PO medications

## 2021-12-12 NOTE — ED Provider Notes (Signed)
Emergency Medicine Provider Triage Evaluation Note  Tyler White , a 25 y.o. male  was evaluated in triage.  Pt complains of SI.  No pain.  Does state he occasionally hears voices.  These are noncommand hallucinations.  Does state he drinks alcohol and uses illicit substances however he is very vague in providing history.  He states keeps repeating "I need a bed at behavioral health"  Review of Systems  Positive: SI, hallucinations Negative:   Physical Exam  BP (!) 142/84 (BP Location: Right Arm)    Pulse 67    Temp 99.4 F (37.4 C)    Resp 17    SpO2 99%  Gen:   Awake, no distress   Resp:  Normal effort  MSK:   Moves extremities without difficulty  Psych:  Flat affect, admits to SI without plan Other:    Medical Decision Making  Medically screening exam initiated at 4:38 AM.  Appropriate orders placed.  Kiyoto Slomski was informed that the remainder of the evaluation will be completed by another provider, this initial triage assessment does not replace that evaluation, and the importance of remaining in the ED until their evaluation is complete.  SI, hallucinations   Johnedward Brodrick A, PA-C 12/12/21 0131    Veryl Speak, MD 12/12/21 [phone]

## 2021-12-12 NOTE — BH Assessment (Signed)
@  1049, TTS Clinician requested nursing Marguarite Arbour, RN) to set up the TTS machine for patient's initial assessment.

## 2021-12-12 NOTE — Progress Notes (Signed)
Tyler White is a 25 year old male being admitted voluntarily to [facility] from [facility].  He presented to the ED with suicidal ideation for two hours and just wants to fall asleep and not wake up.  He did report an OD attempt the previous evening on unknown pills.  He reported AH but they are not command in nature.  He has a history of Polysubstance abuse and Schizoaffective Disorder.  He has had multiple suicide attempts and hospitalizations at [facility].  He reported his stressors as not being able to provide for his daughter during the holidays.  RN at [facility] reported that they have not been able to find his belongings in the ED.  During [facility] admission, he was pleasant but guarded.  He denied SI/HI or AVH at this time.  He does report that the voices do come and go but they don't tell him to hurt himself or anyone else.  He did agree to seek out staff if those symptoms worsen.  He denied any pain or discomfort and appeared to be in no physical distress.  He was minimal with admission process and would answer most questions with one word statements.  Oriented him to the unit.  Paperwork reviewed and completed.  Belongings searched and secured in locker #20.  Skin search completed and no skin issues noted.  Reviewed suicide safety plan, given to patient to complete and return to his nurse.  Q 15 min checks initiated for safety.  We will monitor the progress towards his goals.

## 2021-12-12 NOTE — ED Notes (Signed)
Called safe transport for pt , no eta given they willl be here shortly

## 2021-12-12 NOTE — ED Provider Notes (Signed)
[facility] EMERGENCY DEPARTMENT Provider Note   CSN: [id number] Arrival date & time: 12/12/21  0405     History Chief Complaint  Patient presents with   Suicidal    Tyler White is a 25 y.o. male.  25 year old male presents with complaint of suicidal ideation without plan although vaguely states he might take a pill.  Denies alcohol use, reports cocaine use last night.  Denies any other complaints or concerns today.      Past Medical History:  Diagnosis Date   Hernia, inguinal, right    Inguinal hernia    right   Psychiatric illness    Schizophrenia [facility]     Patient Active Problem List   Diagnosis Date Noted   Xerosis of skin 10/09/2021   Schizoaffective disorder ([place]) 10/08/2021   Schizoaffective disorder, bipolar type ([place]) 04/26/2021   Marijuana abuse in remission 04/26/2021   Polysubstance abuse ([place]) 04/23/2021   Homelessness 04/23/2021   Tobacco abuse 01/15/2021   Schizophrenia ([place]) 11/19/2020   Cannabis abuse 04/28/2020   Cocaine abuse ([place]) 04/28/2020   Cocaine abuse, continuous use ([place]) 02/19/2018   Cocaine abuse with cocaine-induced mood disorder ([place]) 02/19/2018   Psychosis ([place]) 11/27/2017   Cannabis abuse with psychotic disorder, with delusions ([place]) 11/26/2017   Schizophrenia, unspecified ([place]) 11/26/2017   Right inguinal hernia 04/06/2013    No past surgical history on file.     Family History  Problem Relation Age of Onset   Psychiatric Illness Mother    Hypertension Sister     Social History   Tobacco Use   Smoking status: Former    Packs/day: 0.50    Years: 5.00    Pack years: 2.50    Types: Cigarettes   Smokeless tobacco: Never  Vaping Use   Vaping Use: Never used  Substance Use Topics   Alcohol use: Not Currently   Drug use: Not Currently    Types: Marijuana, Cocaine    Comment: denies currently    Home Medications Prior to Admission medications   Medication Sig Start Date End Date Taking?  Authorizing Provider  ARIPiprazole (ABILIFY) 10 MG tablet Take 1 tablet (10 mg total) by mouth daily. 10/24/21   Massengill, Ovid Curd, MD  benztropine (COGENTIN) 0.5 MG tablet Take 1 tablet (0.5 mg total) by mouth 2 (two) times daily. 10/23/21   Massengill, Ovid Curd, MD  cyclobenzaprine (FLEXERIL) 10 MG tablet Take 1 tablet (10 mg total) by mouth 2 (two) times daily as needed for muscle spasms. 10/27/21   [name], PA-C  hydrOXYzine (ATARAX/VISTARIL) 25 MG tablet Take 1 tablet (25 mg total) by mouth 3 (three) times daily as needed for up to 30 doses for anxiety. 10/23/21   Massengill, Ovid Curd, MD  ibuprofen (ADVIL) 600 MG tablet Take 1 tablet (600 mg total) by mouth every 6 (six) hours as needed for moderate pain. 10/27/21   [name], PA-C  paliperidone (INVEGA SUSTENNA) 156 MG/ML SUSY injection Inject 1 mL (156 mg total) into the muscle every 28 (twenty-eight) days. 11/19/21   Massengill, Ovid Curd, MD  propranolol ER (INDERAL LA) 60 MG 24 hr capsule Take 1 capsule (60 mg total) by mouth daily. 10/24/21   Massengill, Ovid Curd, MD  risperiDONE (RISPERDAL M-TABS) 3 MG disintegrating tablet Take 1 tablet (3 mg total) by mouth at bedtime. 10/23/21   Massengill, Ovid Curd, MD  traZODone (DESYREL) 50 MG tablet Take 1 tablet (50 mg total) by mouth at bedtime as needed for sleep. 10/23/21   Janine Limbo, MD  gabapentin (  NEURONTIN) 400 MG capsule Take 1 capsule (400 mg total) by mouth 3 (three) times daily. Patient not taking: Reported on 06/09/2021 04/30/21 06/10/21  Ethelene Hal, NP    Allergies    Patient has no known allergies.  Review of Systems   Review of Systems  Constitutional:  Negative for fever.  Respiratory:  Negative for shortness of breath.   Cardiovascular:  Negative for chest pain.  Gastrointestinal:  Negative for abdominal pain, nausea and vomiting.  Musculoskeletal:  Negative for arthralgias and myalgias.  Skin:  Negative for rash and wound.  Allergic/Immunologic: Negative for  immunocompromised state.  Neurological:  Negative for weakness.  Psychiatric/Behavioral:  Positive for suicidal ideas. Negative for confusion.   All other systems reviewed and are negative.  Physical Exam Updated Vital Signs BP 110/82 (BP Location: Left Arm)    Pulse 61    Temp 98.1 F (36.7 C) (Oral)    Resp 16    SpO2 99%   Physical Exam Vitals and nursing note reviewed.  Constitutional:      General: He is not in acute distress.    Appearance: He is well-developed. He is not diaphoretic.     Comments: Sleeping, arouses to verbal stimuli and is subsequently awake.  HENT:     Head: Normocephalic and atraumatic.  Cardiovascular:     Rate and Rhythm: Normal rate and regular rhythm.     Heart sounds: Normal heart sounds.  Pulmonary:     Effort: Pulmonary effort is normal.     Breath sounds: Normal breath sounds.  Abdominal:     Palpations: Abdomen is soft.     Tenderness: There is no abdominal tenderness.  Musculoskeletal:     Cervical back: Neck supple.  Skin:    General: Skin is warm and dry.     Findings: No erythema or rash.  Neurological:     Mental Status: He is alert and oriented to person, place, and time.  Psychiatric:        Behavior: Behavior normal.    ED Results / Procedures / Treatments   Labs (all labs ordered are listed, but only abnormal results are displayed) Labs Reviewed  COMPREHENSIVE METABOLIC PANEL - Abnormal; Notable for the following components:      Result Value   Glucose, Bld 111 (*)    All other components within normal limits  RAPID URINE DRUG SCREEN, HOSP PERFORMED - Abnormal; Notable for the following components:   Cocaine POSITIVE (*)    Tetrahydrocannabinol POSITIVE (*)    All other components within normal limits  SALICYLATE LEVEL - Abnormal; Notable for the following components:   Salicylate Lvl <7.0 (*)    All other components within normal limits  ACETAMINOPHEN LEVEL - Abnormal; Notable for the following components:    Acetaminophen (Tylenol), Serum <10 (*)    All other components within normal limits  RESP PANEL BY RT-PCR (FLU A&B, COVID) ARPGX2  ETHANOL  CBC WITH DIFFERENTIAL/PLATELET    EKG None  Radiology No results found.  Procedures Procedures   Medications Ordered in ED Medications - No data to display  ED Course  I have reviewed the triage vital signs and the nursing notes.  Pertinent labs & imaging results that were available during my care of the patient were reviewed by me and considered in my medical decision making (see chart for details).  Clinical Course as of 12/12/21 0942  Wed Dec 14, 69102  [id number] 25 year old male with vague SI no other medical complaints.  Patient is evaluated, sleeping, arouses to verbal stimuli and is subsequently awake.Labs are reviewed, patient is positive for cocaine and marijuana.  COVID and flu negative, CBC and CMP without significant findings, negative for alcohol, salicylates, acetaminophen.  Patient is medically cleared for behavioral health evaluation disposition. [LM]    Clinical Course User Index [LM] Roque Lias   MDM Rules/Calculators/A&P                           Final Clinical Impression(s) / ED Diagnoses Final diagnoses:  Suicidal ideations    Rx / DC Orders ED Discharge Orders     None        Tacy Learn, PA-C 12/12/21 [place], [address], DO 12/12/21 1020

## 2021-12-12 NOTE — ED Triage Notes (Signed)
Patient reports feeling suicidal for a couple hours. Patients states that he would like to fall asleep and not wake up.

## 2021-12-12 NOTE — BH Assessment (Addendum)
@  1053, Contacted the Nursing Admin/Triage via phone to request the TTS machine to be set up for patient's initial assessment. Spoke to [place], [employer] whom states that the TTS machine in is use. She will contact this Clinician when patient is ready to be seen.

## 2021-12-12 NOTE — ED Notes (Signed)
Received verbal report from [place] at this time. Attempted to locate pt paperwork and belongings while previous shift RN here. Unable to locate any paperwork and belongings at this time. Checked with triage none found. Contacted charge and made aware

## 2021-12-12 NOTE — Tx Team (Signed)
Initial Treatment Plan 12/12/2021 9:33 PM Tyler White [id number]    PATIENT STRESSORS: Financial difficulties   Marital or family conflict   Medication change or noncompliance   Substance abuse     PATIENT STRENGTHS: General fund of knowledge  Physical Health    PATIENT IDENTIFIED PROBLEMS: Depression  Suicidal ideation  Psychosis      "Get into a stable place"  "Try to get another hearing for my disability"         DISCHARGE CRITERIA:  Improved stabilization in mood, thinking, and/or behavior Need for constant or close observation no longer present Reduction of life-threatening or endangering symptoms to within safe limits Verbal commitment to aftercare and medication compliance  PRELIMINARY DISCHARGE PLAN: Outpatient therapy Medication management  PATIENT/FAMILY INVOLVEMENT: This treatment plan has been presented to and reviewed with the patient, Tyler White.  The patient and family have been given the opportunity to ask questions and make suggestions.  Windell Moment, RN 12/12/2021, 9:33 PM

## 2021-12-12 NOTE — ED Notes (Signed)
Spoke to Network engineer to call safety transport at this time

## 2021-12-12 NOTE — ED Notes (Addendum)
Attempted to look for patient belongings. Per the notes at this time patient belongings have not been charted or in the locker area. Checked triage for belongings, without success

## 2021-12-12 NOTE — BH Assessment (Addendum)
Comprehensive Clinical Assessment (CCA) Note  12/12/2021 [facility] [id number]  Disposition: Per Tyler Newport, NP, patient meets criteria for inpatient psychiatric treatment. Disposition Counselor to seek appropriate bed placement.   [facility] from 12/12/2021 in [facility] from 11/29/2021 in [facility] from 10/27/2021 in [facility] CATEGORY High Risk No Risk Error: Q3, 4, or 5 should not be populated when Q2 is No       The patient demonstrates the following risk factors for suicide: Chronic risk factors for suicide include: psychiatric disorder of  Schizoaffective disorder, bipolar type ([facility]); Major Depressive Disorder, Severe; Rule out Substance Inducted Mood Disorder; Substance Use Disorder, substance use disorder, previous suicide attempts He has attempted suicide in the past, at least 9 times. The suicide attempts would typically occur when patient was in jail: Beating my head against the wall and trying to strangle myself. The most recent suicide attempt prior to last night's overdose was 09/2021, which was also an overdose., and previous self-harm Patient has a hx of self-injurious behaviors that include cutting and burning his fingers. Last episode of self-injurious behaviors was last night. . Acute risk factors for suicide include: social withdrawal/isolation and loss (financial, interpersonal, professional). Protective factors for this patient include:  N/A . Considering these factors, the overall suicide risk at this point appears to be high. Patient is appropriate for outpatient follow up upon medical clearance. .   Chief Complaint:  Chief Complaint  Patient presents with   Suicidal   Visit Diagnosis:   Schizoaffective disorder, bipolar type (T[facility] White); Major Depressive Disorder, Severe; Rule out Substance Inducted Mood Disorder;  Substance Use Disorder  Tyler White, a 25 y.o. male that presents to [facility] with a psychiatric history of schizoaffective disorder- bipolar type, cocaine use disorder-severe/dependent, alcohol use disorder-mild, and tobacco use disorder-mild who presented to [facility] for worsening depression, anxiety, and AH, and was admitted to [facility] for stabilization.  Per chart review, Tyler White has had multiple inpatient psychiatric admissions since 2018, mostly 2/2 substance-induced mood disorder. Tyler White says that his AH began at age 82; collateral from a previous admission stated, "Tyler White states that the patient's issues began when "he got laced" with some substance in high school. She says he has never been the same since and the rocking back and forth and hearing voices began at that time."   Clinician met with patient via tele assessment. Pt complains of suicidal ideations. He started to experience those suicidal thoughts last night. He reports trying to overdose last night.  He initially says that he consumed OTC pills, Xanax. He self-reports consuming at least 20 pills. Patient informed that Xanax would not be an OTC medication.  He then states that he picked up his prescription of Xanax from the pharmacy. The prescriber is reported as his PCP. He doesn't know the PCP's name or location of the provider.   Patient changes his story again and says that the prescription was found in his home. He doesn't know who the pills belonged to or if it was even Xanax that he consumed. States, I really don't know what they were to be honest. However, his intention when taking the pills was to end his life.   The trigger for his suicide attempt yesterday  was reported as Not being able to provide for my daughter over the holidays. States that he doesn't have the financial means to provide for his daughter  who is 39 yrs old. He reports having a lot of additional stressors. However, would not elaborate about those  stressors.  He has attempted suicide in the past, at least 9 times. The suicide attempts would typically occur when patient was in jail: Beating my head against the wall and trying to strangle myself. The most recent suicide attempt prior to last night's overdose was 09/2021, which was also an overdose.  The triggers for past suicide attempts include: the same issues, not being a provider to my daughter. Patient has a hx of self-injurious behaviors that include cutting and burning his fingers. Last episode of self-injurious behaviors was last night.   He has a family hx of PTSD. No hx of trauma and/or abuse. Current support system is his younger and older brother. He is currently unemployed. Highest level of education is the 11th grade. He denies having any hobbies. Lives alone, renting out an [place] [employer].   Denies homicidal ideations. Denies hx of aggressive and/or assaultive behaviors. Denies that he has current legal issues, no court dates, and he is not on probation. Hx of being incarcerated (9x's).   Patient has a hx of auditory/visual hallucinations when he was 25 yrs old. Denies that feels paranoid. Per chart review: Patient reported during his hospitalization 09/2021 that he was imprisoned for the past 4 months, and was released about a week ago, prior to that admission.  Prior to release, he began hearing voices and endorsing SI.  The AH was a familiar male's voice that said "nobody can help me," and "go get admitted."    Patient reports drug use. States that he smokes crack cocaine. Age of first use is 25 yrs old. He uses daily. The amount of use is $40-$60 per use. Last use was yesterday, $40 worth.   Patient reports alcohol use. His alcohol use is every weekend. The frequency of his use is every weekend. He drinks #2 bottles of alcohol every weekend. Last drink was last week.   Patient admitted to [facility] in the past. Upon chart review. Denies that he has a current outpatient  therapist/psychiatrist. States that his psychiatric medications are prescribed by his PCP: Abilify and Risperdal. Denies that he is compliant with the medications prescribed.     CCA Screening, Triage and Referral (STR)  Patient Reported Information How did you hear about [place]? Self  What Is the Reason for Your Visit/Call Today? [name], a 25 y.o. male that presents to [facility].  Pt complains of suicidal ideations. He started to experience those suicidal thoughts last night. He reports trying to overdose last night.  He initially says that he consumed OTC pills, Xanax. He self-reports consuming at least 20 pills. Patient informed that Xanax would not be an OTC medication.   He then states that he picked up his prescription of Xanax from the pharmacy. The prescriber is reported as his PCP. He doesnt know the PCPs name or location of the provider.   Patient changes his story again and says that the prescription was found in his home. He doesnt know who the pills belonged to or if it was even Xanax that he consumed. States, I really dont know what they were to be honest. However, his intention when taking the pills was to end his life.   The trigger for his suicide attempt by overdose was reported as Not being able to provide for my daughter over the holidays. States that he doesnt have the financial means to provide for his  daughter who is 39 yrs old. He reports having a lot of additional stressors. However, would not elaborate about those stressors.   He has attempted suicide in the past, at least 9 times. The suicide attempts would typically occur when patient was in jail: bBating my head against the wall and trying to strangle myself. The most recent suicide attempt prior to last nights overdose was 09/2021, which was also an overdose.  The triggers for past suicide attempts include: the same issues, not being a provider to my daughter. Patient has a hx of self-injurious behaviors that  include cutting and burning his fingers. Last episode of self-injurious behaviors was last night.   He has a family hx of PTSD. No hx of trauma and/or abuse. Current support system is his younger and older brother. He is currently unemployed. Highest level of education is the 11th grade. He denies having any hobbies. Lives alone, renting out an [place] [employer].   Denies homicidal ideations. Denies hx of aggressive and/or assaultive behaviors. Denies that he has current legal issues, no court dates, and he is not on probation. Hx of being incarcerated (9xs). Patient has a hx of auditory/visual hallucinations when he was 25 yrs old.  How Long Has This Been Causing You Problems? > than 6 months  What Do You Feel Would Help You the Most Today? Treatment for Depression or other mood problem; Medication(s); Stress Management   Have You Recently Had Any Thoughts About Hurting Yourself? Yes  Are You Planning to Commit Suicide/Harm Yourself At This time? Yes   Have you Recently Had Thoughts About Hurting Someone [name]? No  Are You Planning to Harm Someone at This Time? No  Explanation: No data recorded  Have You Used Any Alcohol or Drugs in the Past 24 Hours? Yes  How Long Ago Did You Use Drugs or Alcohol? No data recorded What Did You Use and How Much? Pt reports, using $30.00 worth of Crack Cocaine.   Do You Currently Have a Therapist/Psychiatrist? No  Name of Therapist/Psychiatrist: [place]   Have You Been Recently Discharged From Any Office Practice or Programs? No  Explanation of Discharge From Practice/Program: No data recorded    CCA Screening Triage Referral Assessment Type of Contact: Tele-Assessment  Telemedicine Service Delivery: Telemedicine service delivery: This service was provided via telemedicine using a 2-way, interactive audio and video technology  Is this Initial or Reassessment? Initial Assessment  Date Telepsych consult ordered in [facility]:  12/12/21  Time Telepsych consult  ordered in [facility]:  1701  Location of Assessment: [facility]  Provider Location: [facility]   Collateral Involvement: Pt denies, having supports.   Does Patient Have a Stage manager Guardian? No data recorded Name and Contact of Legal Guardian: No data recorded If Minor and Not Living with Parent(s), Who has Custody? No data recorded Is CPS involved or ever been involved? Never  Is APS involved or ever been involved? Never   Patient Determined To Be At Risk for Harm To Self or Others Based on Review of Patient Reported Information or Presenting Complaint? Yes, for Self-Harm  Method: No data recorded Availability of Means: No data recorded Intent: No data recorded Notification Required: No data recorded Additional Information for Danger to Others Potential: No data recorded Additional Comments for Danger to Others Potential: No data recorded Are There Guns or Other Weapons in Your Home? No data recorded Types of Guns/Weapons: No data recorded Are These Weapons Safely Secured?  No data recorded Who Could Verify You Are Able To Have These Secured: No data recorded Do You Have any Outstanding Charges, Pending Court Dates, Parole/Probation? No data recorded Contacted To Inform of Risk of Harm To Self or Others: Unable to Contact: (NA)    Does Patient Present under Involuntary Commitment? No  IVC Papers Initial File Date: No data recorded  [place] of Residence: [place]   Patient Currently Receiving the Following Services: -- (Not receiving services at this time.)   Determination of Need: Emergent (2 hours)   Options For Referral: Inpatient Hospitalization; Medication Management     CCA Biopsychosocial Patient Reported Schizophrenia/Schizoaffective Diagnosis in Past: Yes   Strengths: Pt seeking treatment.   Mental Health Symptoms Depression:   Difficulty Concentrating; Sleep (too much or little); Irritability; Fatigue    Duration of Depressive symptoms:  Duration of Depressive Symptoms: Greater than two weeks   Mania:   N/A   Anxiety:    Difficulty concentrating; Sleep; Worrying; Fatigue   Psychosis:   Hallucinations   Duration of Psychotic symptoms:    Trauma:   None   Obsessions:   None   Compulsions:   None   Inattention:   Forgetful; Disorganized; Loses things   Hyperactivity/Impulsivity:   Fidgets with hands/feet   Oppositional/Defiant Behaviors:   Argumentative; Angry   Emotional Irregularity:   Recurrent suicidal behaviors/gestures/threats; Chronic feelings of emptiness   Other Mood/Personality Symptoms:   NA    Mental Status Exam Appearance and self-care  Stature:   Average   Weight:   Average weight   Clothing:   -- (Pt in scrubs.)   Grooming:   Normal   Cosmetic use:   None   Posture/gait:   Slumped   Motor activity:   Slowed   Sensorium  Attention:   Normal   Concentration:   Normal   Orientation:   X5   Recall/memory:   Normal   Affect and Mood  Affect:   Flat   Mood:   Depressed   Relating  Eye contact:   Normal   Facial expression:   Responsive   Attitude toward examiner:   Cooperative   Thought and Language  Speech flow:  Soft   Thought content:   Appropriate to Mood and Circumstances   Preoccupation:   None   Hallucinations:   Auditory   Organization:  No data recorded  [employer] of Knowledge:   Average   Intelligence:   Average   Abstraction:   Normal   Judgement:   Poor   Reality Testing:   Realistic   Insight:   Lacking   Decision Making:   Only simple   Social Functioning  Social Maturity:   Isolates   Social Judgement:   "Games developer"   Stress  Stressors:   Housing; Teacher, music Ability:   Deficient supports   Skill Deficits:   Environmental health practitioner   Supports:   Support needed     Religion: Religion/Spirituality Are You A Religious Person?:  No How Might This Affect Treatment?: NA  Leisure/Recreation: Leisure / Recreation Do You Have Hobbies?: No  Exercise/Diet: Exercise/Diet Do You Exercise?: No Have You Gained or Lost A Significant Amount of Weight in the Past Six Months?: No Do You Follow a Special Diet?: No   CCA Employment/Education Employment/Work Situation: Employment / Work Situation Employment Situation: Unemployed Has Patient ever Been in Passenger transport manager?: No  Education: Education Is Patient Currently Attending School?: No Last Grade Completed: 11  Did You Attend College?: No Did You Have An Individualized Education Program (IIEP): No Did You Have Any Difficulty At School?: No Patient's Education Has Been Impacted by Current Illness: No   CCA Family/Childhood History Family and Relationship History: Family history Marital status: Single Does patient have children?: Yes How many children?: 1 How is patient's relationship with their children?: Pt has a 82 year old daughter.  Childhood History:  Childhood History By whom was/is the patient raised?: Mother Did patient suffer any verbal/emotional/physical/sexual abuse as a child?: No Did patient suffer from severe childhood neglect?: No Was the patient ever a victim of a crime or a disaster?: No Witnessed domestic violence?: No Has patient been affected by domestic violence as an adult?: No  Child/Adolescent Assessment:     CCA Substance Use Alcohol/Drug Use: Alcohol / Drug Use Pain Medications: See MAR Prescriptions: See MAR Over the Counter: See MAR History of alcohol / drug use?: Yes Longest period of sobriety (when/how long): none reported Negative Consequences of Use: Financial, Personal relationships Substance #1 Name of Substance 1: Patient reports alcohol use. His alcohol use is every weekend. The frequency of his use is every weekend. He drinks #2 bottles of alcohol every weekend. Last drink was last week. 1 - Age of First Use:  unknown 1 - Amount (size/oz): #2 bottles on the weekends 1 - Frequency: every weekend 1 - Duration: unknown 1 - Last Use / Amount: "last week" 1 - Method of Aquiring: unknown 1- Route of Use: oral Substance #2 Name of Substance 2: Patient reports drug use. States that he smokes crack cocaine. Age of first use is 25 yrs old. He uses daily. The amount of use is $40-$60 per use. Last use was yesterday, $40 worth. 2 - Age of First Use: 25 yrs old 2 - Amount (size/oz): $40-$60 per use 2 - Frequency: daily 2 - Duration: on-going since the age of 89 2 - Last Use / Amount: 12/11/21 2 - Method of Aquiring: n/a 2 - Route of Substance Use: n/a                     ASAM's:  Six Dimensions of Multidimensional Assessment  Dimension 1:  Acute Intoxication and/or Withdrawal Potential:   Dimension 1:  Description of individual's past and current experiences of substance use and withdrawal: Patient does not report experiencing any complications with withdrawal symptoms.  Dimension 2:  Biomedical Conditions and Complications:   Dimension 2:  Description of patient's biomedical conditions and  complications: Patient does not report any medical complications that are exacerbated by his crack use.  Dimension 3:  Emotional, Behavioral, or Cognitive Conditions and Complications:  Dimension 3:  Description of emotional, behavioral, or cognitive conditions and complications: Per chart, diagnosed with Schizoaffective Disorder. Pt denies, currently receiving therapy and medication management.  Dimension 4:  Readiness to Change:  Dimension 4:  Description of Readiness to Change criteria: Pt reports, wanting inpatient treatment.  Dimension 5:  Relapse, Continued use, or Continued Problem Potential:  Dimension 5:  Relapse, continued use, or continued problem potential critiera description: Patient has no significant period of abstinence.  Dimension 6:  Recovery/Living Environment:  Dimension 6:  Recovery/Iiving  environment criteria description: Pt is homeless and denies supports.  ASAM Severity Score: ASAM's Severity Rating Score: 12  ASAM Recommended Level of Treatment: ASAM Recommended Level of Treatment: Level II Intensive Outpatient Treatment   Substance use Disorder (SUD) Substance Use Disorder (SUD)  Checklist Symptoms of Substance Use:  Continued use despite having a persistent/recurrent physical/psychological problem caused/exacerbated by use, Continued use despite persistent or recurrent social, interpersonal problems, caused or exacerbated by use, Social, occupational, recreational activities given up or reduced due to use, Large amounts of time spent to obtain, use or recover from the substance(s)  Recommendations for Services/Supports/Treatments: Recommendations for Services/Supports/Treatments Recommendations For Services/Supports/Treatments: Inpatient Hospitalization  Discharge Disposition:    DSM5 Diagnoses: Patient Active Problem List   Diagnosis Date Noted   Xerosis of skin 10/09/2021   Schizoaffective disorder ([place]) 10/08/2021   Schizoaffective disorder, bipolar type ([place]) 04/26/2021   Marijuana abuse in remission 04/26/2021   Polysubstance abuse ([place]) 04/23/2021   Homelessness 04/23/2021   Tobacco abuse 01/15/2021   Schizophrenia ([place]) 11/19/2020   Cannabis abuse 04/28/2020   Cocaine abuse ([place]) 04/28/2020   Cocaine abuse, continuous use ([place]) 02/19/2018   Cocaine abuse with cocaine-induced mood disorder ([place]) 02/19/2018   Psychosis ([place]) 11/27/2017   Cannabis abuse with psychotic disorder, with delusions ([place]) 11/26/2017   Schizophrenia, unspecified ([place]) 11/26/2017   Right inguinal hernia 04/06/2013     Referrals to Alternative Service(s): Referred to Alternative Service(s):   Place:   Date:   Time:    Referred to Alternative Service(s):   Place:   Date:   Time:    Referred to Alternative Service(s):   Place:   Date:   Time:    Referred to Alternative Service(s):    Place:   Date:   Time:     Waldon Merl, Counselor

## 2021-12-12 NOTE — ED Notes (Signed)
Patient was given Grahams and drink.

## 2021-12-12 NOTE — ED Notes (Signed)
Pt currently in purple scrubs and wanded by security. Remains in triage at this time.

## 2021-12-12 NOTE — Progress Notes (Signed)
Pt accepted to [facility] [id number]   Patient meets inpatient criteria per [name], NP   The attending provider will be [place], MD   Call report to [phone]    [facility], RN @ [facility] ED notified.     Pt scheduled  to arrive at [facility] at 1630. Please send voluntary consent via fax prior to transporting the Pt.    Mariea Clonts, MSW, LCSW-A  2:38 PM 12/12/2021

## 2021-12-13 LAB — LIPID PANEL
Cholesterol: 161 mg/dL (ref 0–200)
HDL: 75 mg/dL (ref >40)
LDL Cholesterol: 70 mg/dL (ref 0–99)
Total CHOL/HDL Ratio: 2.1 RATIO
Triglycerides: 81 mg/dL (ref <150)
VLDL: 16 mg/dL (ref 0–40)

## 2021-12-13 LAB — TSH: TSH: 0.802 u[IU]/mL (ref 0.350–4.500)

## 2021-12-13 MED ORDER — RISPERIDONE 1 MG PO TABS
1.0000 mg | ORAL_TABLET | Freq: Every day | ORAL | Status: DC
  Administered 2021-12-13: 1 mg via ORAL
  Filled 2021-12-13 (×3): qty 1

## 2021-12-13 MED ORDER — RISPERIDONE 2 MG PO TBDP: 2.0000 mg | ORAL_TABLET | Freq: Three times a day (TID) | ORAL | Status: DC | PRN

## 2021-12-13 MED ORDER — ZIPRASIDONE MESYLATE 20 MG IM SOLR: 20.0000 mg | INTRAMUSCULAR | Status: DC | PRN

## 2021-12-13 MED ORDER — RISPERIDONE 0.5 MG PO TABS
0.5000 mg | ORAL_TABLET | Freq: Every day | ORAL | Status: DC
  Administered 2021-12-13 – 2021-12-14 (×2): 0.5 mg via ORAL
  Filled 2021-12-13 (×5): qty 1

## 2021-12-13 MED ORDER — ARIPIPRAZOLE 5 MG PO TABS
5.0000 mg | ORAL_TABLET | Freq: Every day | ORAL | Status: DC
  Administered 2021-12-14 – 2021-12-16 (×3): 5 mg via ORAL
  Filled 2021-12-13 (×4): qty 1
  Filled 2021-12-13: qty 7

## 2021-12-13 MED ORDER — LORAZEPAM 1 MG PO TABS: 1.0000 mg | ORAL_TABLET | ORAL | Status: DC | PRN
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
## 2021-12-13 NOTE — Progress Notes (Signed)
Psychoeducational Group Note  Date:  12/13/2021 Time:  2015  Group Topic/Focus:  Wrap up group  Participation Level: Did Not Attend  Participation Quality:  Not Applicable  Affect:  Not Applicable  Cognitive:  Not Applicable  Insight:  Not Applicable  Engagement in Group: Not Applicable  Additional Comments:  Did not attend.   Shellia Cleverly 12/13/2021, 9:23 PM

## 2021-12-13 NOTE — [facility] Group Notes (Signed)
Pt did not attend morning goals group. 

## 2021-12-13 NOTE — Progress Notes (Signed)
DAR NOTE: Patient presents with a flat affect and depressed mood.  Denies suicidal thoughts, pain, auditory and visual hallucinations.  Rates depression at 0, hopelessness at 0, and anxiety at 0.  Maintained on routine safety checks.  Medications given as prescribed.  Support and encouragement offered as needed.  States goal for today is "clearing my head."  Patient observed pacing the hallway with minimal interaction.  Patient is safe on and off the unit. Offered no complaint.

## 2021-12-13 NOTE — BHH Suicide Risk Assessment (Signed)
[facility] Admission Suicide Risk Assessment   Nursing information obtained from:  Patient Demographic factors:  Male, Low socioeconomic status, Adolescent or young adult, Living alone, Unemployed Current Mental Status:  Suicidal ideation indicated by patient Loss Factors:  Decrease in vocational status, Legal issues Historical Factors:  Prior suicide attempts, Impulsivity Risk Reduction Factors:  Responsible for children under 40 years of age  Total Time spent with patient: 30 minutes Principal Problem: Schizoaffective disorder, bipolar type ([place]) Diagnosis:  Principal Problem:   Schizoaffective disorder, bipolar type ([place]) Active Problems:   Cocaine abuse, continuous use ([place])   Homelessness   Cannabis abuse   Tobacco abuse  Subjective Data:  Tyler White is a 25 year old male with a psychiatric history of schizoaffective disorder- bipolar type, cocaine use disorder-severe/dependent, alcohol use disorder-mild, and tobacco use disorder-mild who presented to [facility] ED due to worsening depression and suicidal thoughts.   Pt reports worsening depression and suicidal thoughts and recently putting cord around his neck. He reports multiple life stressors including: homelessness, medication non adherence, financial insecurity, and loneliness from being separated from his child. He has recent admission during which predominant psychotic symptoms were treated. Psychotic symptoms persist this admission as well, but pt reports that depressive symptoms are more severe and disabling.  Pt agreeable to restarting medications, as in the plan in H&P.     Continued Clinical Symptoms:  Alcohol Use Disorder Identification Test Final Score (AUDIT): 0 The "Alcohol Use Disorders Identification Test", Guidelines for Use in Primary Care, Second Edition.  World Pharmacologist [facility]). Score between 0-7:  no or low risk or alcohol related problems. Score between 8-15:  moderate risk of alcohol related problems. Score  between 16-19:  high risk of alcohol related problems. Score 20 or above:  warrants further diagnostic evaluation for alcohol dependence and treatment.   CLINICAL FACTORS:   Severe Anxiety and/or Agitation Depression:   Anhedonia Hopelessness Impulsivity Insomnia Schizophrenia:   Command hallucinatons Depressive state   Musculoskeletal: Strength & Muscle Tone: within normal limits Gait & Station: normal Patient leans: N/A  Psychiatric Specialty Exam:  Presentation  General Appearance: Disheveled  Eye Contact:Poor  Speech:Normal Rate  Speech Volume:Decreased  Handedness:Right   Mood and Affect  Mood:Anxious; Depressed; Dysphoric  Affect:Flat   Thought Process  Thought Processes:Linear  Descriptions of Associations:Intact  Orientation:Full (Time, Place and Person)  Thought Content:Logical  History of Schizophrenia/Schizoaffective disorder:Yes  Duration of Psychotic Symptoms:Greater than six months  Hallucinations:Hallucinations: Auditory; Visual Description of Visual Hallucinations: Describes floaters (circles of varying sizes)  Ideas of Reference:Paranoia  Suicidal Thoughts:Suicidal Thoughts: Yes, Passive SI Active Intent and/or Plan: Without Intent; Without Plan  Homicidal Thoughts:Homicidal Thoughts: No   Sensorium  Memory:Recent Good; Immediate Good; Remote Good  Judgment:Fair  Insight:Poor   Executive Functions  Concentration:Poor  Attention Span:Poor  Recall:Good  Fund of Knowledge:Good  Language:Good   Psychomotor Activity  Psychomotor Activity:Psychomotor Activity: Decreased   Assets  Assets:Communication Skills; Desire for Improvement; Physical Health; Resilience; Social Support   Sleep  Sleep:Sleep: Poor    Physical Exam: Physical Exam Vitals reviewed.  Constitutional:      General: He is not in acute distress.    Appearance: He is not toxic-appearing.  Pulmonary:     Effort: Pulmonary effort is normal.   Neurological:     Motor: No weakness.     Gait: Gait normal.   Review of Systems  Constitutional:  Negative for chills and fever.  Cardiovascular:  Negative for chest pain and palpitations.  Psychiatric/Behavioral:  Positive for depression, hallucinations, substance abuse and suicidal ideas. The patient is nervous/anxious and has insomnia.   All other systems reviewed and are negative.  Blood pressure 109/84, pulse 63, temperature 98 F (36.7 C), temperature source Oral, resp. rate 20, height 5' 8.5" (1.74 m), weight 77.9 kg, SpO2 99 %. Body mass index is 25.74 kg/m.   COGNITIVE FEATURES THAT CONTRIBUTE TO RISK:  None    SUICIDE RISK:   Moderate:  Frequent suicidal ideation with limited intensity, and duration, some specificity in terms of plans, no associated intent, good self-control, limited dysphoria/symptomatology, some risk factors present, and identifiable protective factors, including available and accessible social support.  PLAN OF CARE:   See H&P for plan.   I certify that inpatient services furnished can reasonably be expected to improve the patient's condition.   Christoper Allegra, MD 12/13/2021, 8:01 PM
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
## 2021-12-14 ENCOUNTER — Encounter (HOSPITAL_COMMUNITY): Payer: Self-pay

## 2021-12-14 DIAGNOSIS — F250 Schizoaffective disorder, bipolar type: Principal | ICD-10-CM

## 2021-12-14 MED ORDER — RISPERIDONE 2 MG PO TABS
2.0000 mg | ORAL_TABLET | Freq: Every day | ORAL | Status: DC
  Administered 2021-12-15: 2 mg via ORAL
  Filled 2021-12-14 (×4): qty 1

## 2021-12-14 MED ORDER — RISPERIDONE 1 MG PO TABS
1.5000 mg | ORAL_TABLET | Freq: Once | ORAL | Status: AC
  Administered 2021-12-16: 09:00:00 1.5 mg via ORAL
  Filled 2021-12-14: qty 1.5

## 2021-12-14 MED ORDER — RISPERIDONE 1 MG PO TABS
1.5000 mg | ORAL_TABLET | Freq: Once | ORAL | Status: AC
Start: 2021-12-14 — End: 2021-12-14
  Administered 2021-12-14: 1.5 mg via ORAL
  Filled 2021-12-14: qty 1.5

## 2021-12-14 MED ORDER — RISPERIDONE 2 MG PO TABS
2.0000 mg | ORAL_TABLET | Freq: Once | ORAL | Status: DC
  Filled 2021-12-14: qty 1

## 2021-12-14 MED ORDER — RISPERIDONE 1 MG PO TABS
1.0000 mg | ORAL_TABLET | Freq: Once | ORAL | Status: AC
  Administered 2021-12-15: 1 mg via ORAL
  Filled 2021-12-14: qty 1

## 2021-12-14 NOTE — Progress Notes (Signed)
[facility] MD Progress Note  12/14/2021 6:17 PM Tyler White  MRN:  [id number] Subjective:  Tyler White is a 25 year old male with a psychiatric history of schizoaffective disorder- bipolar type, cocaine use disorder-severe/dependent, alcohol use disorder-mild, and tobacco use disorder-mild who presented to [facility] ED for SI with life stressors.    Chart Review of Past 24 hrs: The patient's chart was reviewed and nursing notes were reviewed. The patient's case was discussed in multidisciplinary team meeting.  Per MAR: - Patient is compliant with scheduled meds. - PRNs:  Per RN notes, no documented behavioral issues and is attending group.  Patient had the following psychiatric recommendations yesterday:  -Restart home Risperdal at 0.5 mg qAM and 1 mg nightly for psychotic symptoms; titrating to prevent EPS -Decrease to Abilify 5 mg every morning for mood stabilization; Abilify 10 mg initiated in the ED; would like to titrate up, as tolerated. - Continue home Cogentin 0.5 mg twice daily for EPS prophylaxis  On Today's Assessment (12/14/2021): Case was discussed in the multidisciplinary team. MAR was reviewed and patient was compliant with medications. Patient seen, assessed, and discussed with attending Dr. Caswell Corwin.  He reports that he is doing well today, slept well last night, and appetite is good.  He reports no SI, HI, AVH, first rank symptoms, ideas of reference, or paranoia on the unit. He continues to maintain the goal of substance cessation so that he is able to see his daughter. He has insight into the fact that he uses crack cocaine when he feels guilty about not being a present parent. He informed our team that he signed his 72-hour discharge form and we discussed treatment plan for the remainder of his admission. He was agreeable to this plan.   Principal Problem: Schizoaffective disorder, bipolar type ([place]) Diagnosis: Principal Problem:   Schizoaffective disorder, bipolar type  ([place]) Active Problems:   Cocaine abuse, continuous use ([place])   Homelessness   Cannabis abuse   Tobacco abuse  Total Time spent with patient: 30 minutes  Past Psychiatric History: See H&P  Past Medical History:  Past Medical History:  Diagnosis Date   Hernia, inguinal, right    Inguinal hernia    right   Psychiatric illness    Schizophrenia ([place])    History reviewed. No pertinent surgical history. Family History:  Family History  Problem Relation Age of Onset   Psychiatric Illness Mother    Hypertension Sister    Family Psychiatric  History: See H&P Social History:  Social History   Substance and Sexual Activity  Alcohol Use Not Currently     Social History   Substance and Sexual Activity  Drug Use Not Currently   Types: Marijuana, Cocaine   Comment: denies currently    Social History   Socioeconomic History   Marital status: Single    Spouse name: Not on file   Number of children: Not on file   Years of education: 10   Highest education level: Not on file  Occupational History   Not on file  Tobacco Use   Smoking status: Every Day    Packs/day: 0.50    Years: 5.00    Pack years: 2.50    Types: Cigarettes   Smokeless tobacco: Never  Vaping Use   Vaping Use: Never used  Substance and Sexual Activity   Alcohol use: Not Currently   Drug use: Not Currently    Types: Marijuana, Cocaine    Comment: denies currently   Sexual activity: Yes  Birth control/protection: None  Other Topics Concern   Not on file  Social History Narrative   ** Merged History Encounter **       ** Merged History Encounter **       Social Determinants of Radio broadcast assistant Strain: Not on file  Food Insecurity: Not on file  Transportation Needs: Not on file  Physical Activity: Not on file  Stress: Not on file  Social Connections: Not on file   Additional Social History:         Sleep: Good  Appetite:  Good  Current Medications: Current  Facility-Administered Medications  Medication Dose Route Frequency Provider Last Rate Last Admin   acetaminophen (TYLENOL) tablet 650 mg  650 mg Oral Q6H PRN Rankin, Shuvon B, NP       alum & mag hydroxide-simeth (MAALOX/MYLANTA) 200-200-20 MG/5ML suspension 30 mL  30 mL Oral Q4H PRN Rankin, Shuvon B, NP       ARIPiprazole (ABILIFY) tablet 5 mg  5 mg Oral Daily Rosezetta Schlatter, MD   5 mg at 12/14/21 0948   benztropine (COGENTIN) tablet 0.5 mg  0.5 mg Oral BID Rankin, Shuvon B, NP   0.5 mg at 12/14/21 1728   hydrOXYzine (ATARAX) tablet 25 mg  25 mg Oral TID PRN Rankin, Shuvon B, NP       risperiDONE (RISPERDAL M-TABS) disintegrating tablet 2 mg  2 mg Oral Q8H PRN Massengill, Nathan, MD       And   LORazepam (ATIVAN) tablet 1 mg  1 mg Oral PRN Massengill, Ovid Curd, MD       And   ziprasidone (GEODON) injection 20 mg  20 mg Intramuscular PRN Massengill, Ovid Curd, MD       magnesium hydroxide (MILK OF MAGNESIA) suspension 30 mL  30 mL Oral Daily PRN Rankin, Shuvon B, NP       [START ON 12/15/2021] risperiDONE (RISPERDAL) tablet 1 mg  1 mg Oral Once Rosezetta Schlatter, MD       Followed by   Derrill Memo ON 12/16/2021] risperiDONE (RISPERDAL) tablet 1.5 mg  1.5 mg Oral Once Rosezetta Schlatter, MD       Followed by   Derrill Memo ON 12/17/2021] risperiDONE (RISPERDAL) tablet 2 mg  2 mg Oral Once Rosezetta Schlatter, MD       risperiDONE (RISPERDAL) tablet 1.5 mg  1.5 mg Oral Once Rosezetta Schlatter, MD       Followed by   Derrill Memo ON 12/15/2021] risperiDONE (RISPERDAL) tablet 2 mg  2 mg Oral QHS Rosezetta Schlatter, MD       traZODone (DESYREL) tablet 50 mg  50 mg Oral QHS PRN Rankin, Shuvon B, NP   50 mg at 12/13/21 2131    Lab Results:  Results for orders placed or performed during the hospital encounter of 12/12/21 (from the past 48 hour(s))  TSH     Status: None   Collection Time: 12/13/21  6:31 PM  Result Value Ref Range   TSH 0.802 0.350 - 4.500 uIU/mL    Comment: Performed by a 3rd Generation assay with a functional  sensitivity of <=0.01 uIU/mL. Performed at [facility], [place] [address] [place], [place] [address]   Lipid panel     Status: None   Collection Time: 12/13/21  6:31 PM  Result Value Ref Range   Cholesterol 161 0 - 200 mg/dL   Triglycerides 81 <150 mg/dL   HDL 75 >40 mg/dL   Total CHOL/HDL Ratio 2.1 RATIO   VLDL 16  0 - 40 mg/dL   LDL Cholesterol 70 0 - 99 mg/dL    Comment:        Total Cholesterol/HDL:CHD Risk Coronary Heart Disease Risk Table                     Men   Women  1/2 Average Risk   3.4   3.3  Average Risk       5.0   4.4  2 X Average Risk   9.6   7.1  3 X Average Risk  23.4   11.0        Use the calculated Patient Ratio above and the CHD Risk Table to determine the patient's CHD Risk.        ATP III CLASSIFICATION (LDL):  <100     mg/dL   Optimal  100-129  mg/dL   Near or Above                    Optimal  130-159  mg/dL   Borderline  160-189  mg/dL   High  >190     mg/dL   Very High Performed at [facility] [address] [place], [place] [address]     Blood Alcohol level:  Lab Results  Component Value Date   ETH <10 12/12/2021   ETH <10 52/84/1324    Metabolic Disorder Labs: Lab Results  Component Value Date   HGBA1C 5.0 10/09/2021   MPG 96.8 10/09/2021   MPG 99.67 11/02/2020   Lab Results  Component Value Date   PROLACTIN 32.5 (H) 05/30/2018   Lab Results  Component Value Date   CHOL 161 12/13/2021   TRIG 81 12/13/2021   HDL 75 12/13/2021   CHOLHDL 2.1 12/13/2021   VLDL 16 12/13/2021   LDLCALC 70 12/13/2021   LDLCALC 96 10/09/2021    Physical Findings: AIMS: Facial and Oral Movements Muscles of Facial Expression: None, normal Lips and Perioral Area: None, normal Jaw: None, normal Tongue: None, normal,Extremity Movements Upper (arms, wrists, hands, fingers): None, normal Lower (legs, knees, ankles, toes): None, normal, Trunk Movements Neck, shoulders, hips: None, normal, Overall  Severity Severity of abnormal movements (highest score from questions above): None, normal Incapacitation due to abnormal movements: None, normal Patient's awareness of abnormal movements (rate only patient's report): No Awareness, Dental Status Current problems with teeth and/or dentures?: No Does patient usually wear dentures?: No    Musculoskeletal: Strength & Muscle Tone: within normal limits Gait & Station: normal Patient leans: N/A  Psychiatric Specialty Exam:  Presentation  General Appearance: Casual; Fairly Groomed  Eye Contact:Fair  Speech:Normal Rate  Speech Volume:Normal  Handedness:Right   Mood and Affect  Mood:Euthymic  Affect:Blunt   Thought Process  Thought Processes:Goal Directed; Linear  Descriptions of Associations:Intact  Orientation:Full (Time, Place and Person)  Thought Content:Logical  History of Schizophrenia/Schizoaffective disorder:Yes  Duration of Psychotic Symptoms:Greater than six months  Hallucinations:Hallucinations: -- (History of AVH; denies today) Description of Visual Hallucinations: Describes floaters (circles of varying sizes)  Ideas of Reference:None (Denies today)  Suicidal Thoughts:Suicidal Thoughts: No (Denies today) SI Active Intent and/or Plan: Without Intent; Without Plan  Homicidal Thoughts:Homicidal Thoughts: No   Sensorium  Memory:Immediate Good; Recent Good; Remote Good  Judgment:Fair  Insight:Fair; Shallow (Improving)   Executive Functions  Concentration:Fair  Attention Span:Fair  Chicken  Language:Good   Psychomotor Activity  Psychomotor Activity:Psychomotor Activity: Restlessness   Assets  Assets:Communication Skills; Desire for Improvement; Physical Health;  Resilience   Sleep  Sleep:Sleep: Fair    Physical Exam: Physical Exam Vitals and nursing note reviewed.  Constitutional:      General: He is not in acute distress.    Appearance: He is normal  weight.  HENT:     Head: Normocephalic and atraumatic.     Mouth/Throat:     Mouth: Mucous membranes are moist.     Pharynx: Oropharynx is clear.  Pulmonary:     Effort: Pulmonary effort is normal.  Musculoskeletal:        General: Normal range of motion.  Skin:    General: Skin is warm and dry.     Findings: No rash.  Neurological:     General: No focal deficit present.     Mental Status: He is alert and oriented to person, place, and time. Mental status is at baseline.     Gait: Gait normal.   Review of Systems  HENT:  Negative for congestion.   Cardiovascular:  Negative for chest pain.  Gastrointestinal: Negative.   Genitourinary: Negative.   Musculoskeletal: Negative.   Skin:  Negative for itching.  Neurological:  Negative for headaches.  Blood pressure 131/76, pulse 90, temperature 98.3 F (36.8 C), temperature source Oral, resp. rate 20, height 5' 8.5" (1.74 m), weight 77.9 kg, SpO2 99 %. Body mass index is 25.74 kg/m.  ASSESSMENT: Principal Problem: schizoaffective disorder-bipolar type disorder (HCC)  Treatment Plan: Daily contact with patient to assess and evaluate symptoms and progress in treatment and Medication management   Safety and Monitoring: voluntarily admission to inpatient psychiatric unit for safety, stabilization and treatment Daily contact with patient to assess and evaluate symptoms and progress in treatment Patient's case to be discussed in multi-disciplinary team meeting Observation Level : q15 minute checks Vital signs: q12 hours Precautions: suicide, elopement, and assault   2. Psychiatric Diagnoses: #Schizoaffective disorder- BP type, current episode depressed; mood sx likely worsened by substance use Discharge medications: Abilify 10 mg daily, Cogentin 0.5 mg daily, Invega Sustenna 156 mg IM, Inderal 60 mg daily, Risperdal 3 mg qHS. Patient has not taken medications since 7 day sample provided at discharge ran out. -Titrating Risperdal for  psychotic symptoms and maintenance: 0.5 mg 12/16 AM and 1.5 mg 12/16 PM, 1 mg 12/17 AM and 2 mg 12/17 PM, 1.5 mg 12/18 AM and 2 mg 12/18 PM; 2 mg BID from there. - Continue Abilify 5 mg every morning for mood stabilization; Abilify 10 mg initiated in the ED; no indication to titrate up. - Continue home Cogentin 0.5 mg twice daily for EPS prophylaxis   #Cocaine Use disorder- continuous use #Marijuana use disorder- in remission No withdrawal symptoms experienced at this time - Advised discontinuation of drug use.  We will offer patient substance use treatment options.  3. Medical Management: Covid negative CMP: Unremarkable CBC: unremarkable EtOH: <10 UDS: Positive cocaine and THC TSH: 0.802 A1C: 5.0% on 10/11 Lipids: WNL  #Elevated blood pressure without diagnosis of hypertension Most recent BP 131/76 - Continue to monitor; no acute interventions to be initiated at this time  Discharge Planning:              -- Social work and case management to assist with discharge planning and identification of hospital follow-up needs prior to discharge.              -- Estimated DoD: 12/19; 72-hour form signed today, 12/16.             -- Discharge Concerns:  Need to establish a safety plan; Medication compliance and effectiveness             -- Discharge Goals: Return home with outpatient referrals for mental health follow-up including medication management/psychotherapy   Rosezetta Schlatter, MD 12/14/2021, 6:17 PM

## 2021-12-14 NOTE — Progress Notes (Signed)
Tyler White was isolative to his room this evening.  He did not attend evening wrap up group.  He had to be woken up for hs medications.  He denied SI/HI.  He continues to hear voices but they aren't bothering him at this time.  He is currently resting with his eyes closed and appears to be asleep.  Q 15 minute checks maintained for safety.   12/13/21 2131  Psych Admission Type (Psych Patients Only)  Admission Status Voluntary  Psychosocial Assessment  Patient Complaints Insomnia  Eye Contact Poor  Facial Expression Sad;Flat  Affect Sad;Flat  Speech Logical/coherent;Soft  Interaction Cautious;Minimal  Motor Activity Slow  Appearance/Hygiene Disheveled;Poor hygiene  Behavior Characteristics Cooperative  Mood Depressed  Thought Process  Coherency Concrete thinking  Content WDL  Delusions None reported or observed  Perception Hallucinations  Hallucination Auditory  Judgment Impaired  Confusion Mild  Danger to Self  Current suicidal ideation? Denies  Danger to Others  Danger to Others None reported or observed

## 2021-12-14 NOTE — BH IP Treatment Plan (Signed)
Interdisciplinary Treatment and Diagnostic Plan Update  12/14/2021  Tyler White MRN: [id number]  Principal Diagnosis: Schizoaffective disorder, bipolar type [facility])  Secondary Diagnoses: Principal Problem:   Schizoaffective disorder, bipolar type ([place]) Active Problems:   Cocaine abuse, continuous use ([place])   Homelessness   Cannabis abuse   Tobacco abuse   Current Medications:  Current Facility-Administered Medications  Medication Dose Route Frequency Provider Last Rate Last Admin   acetaminophen (TYLENOL) tablet 650 mg  650 mg Oral Q6H PRN Rankin, Shuvon B, NP       alum & mag hydroxide-simeth (MAALOX/MYLANTA) 200-200-20 MG/5ML suspension 30 mL  30 mL Oral Q4H PRN Rankin, Shuvon B, NP       ARIPiprazole (ABILIFY) tablet 5 mg  5 mg Oral Daily Rosezetta Schlatter, MD   5 mg at 12/14/21 0948   benztropine (COGENTIN) tablet 0.5 mg  0.5 mg Oral BID Rankin, Shuvon B, NP   0.5 mg at 12/14/21 0948   hydrOXYzine (ATARAX) tablet 25 mg  25 mg Oral TID PRN Rankin, Shuvon B, NP       risperiDONE (RISPERDAL M-TABS) disintegrating tablet 2 mg  2 mg Oral Q8H PRN Massengill, Nathan, MD       And   LORazepam (ATIVAN) tablet 1 mg  1 mg Oral PRN Massengill, Ovid Curd, MD       And   ziprasidone (GEODON) injection 20 mg  20 mg Intramuscular PRN Massengill, Ovid Curd, MD       magnesium hydroxide (MILK OF MAGNESIA) suspension 30 mL  30 mL Oral Daily PRN Rankin, Shuvon B, NP       [START ON 12/15/2021] risperiDONE (RISPERDAL) tablet 1 mg  1 mg Oral Once Rosezetta Schlatter, MD       Followed by   Derrill Memo ON 12/16/2021] risperiDONE (RISPERDAL) tablet 1.5 mg  1.5 mg Oral Once Rosezetta Schlatter, MD       Followed by   Derrill Memo ON 12/17/2021] risperiDONE (RISPERDAL) tablet 2 mg  2 mg Oral Once Rosezetta Schlatter, MD       risperiDONE (RISPERDAL) tablet 1.5 mg  1.5 mg Oral Once Rosezetta Schlatter, MD       Followed by   Derrill Memo ON 12/15/2021] risperiDONE (RISPERDAL) tablet 2 mg  2 mg Oral QHS Rosezetta Schlatter, MD       traZODone  (DESYREL) tablet 50 mg  50 mg Oral QHS PRN Rankin, Shuvon B, NP   50 mg at 12/13/21 2131   PTA Medications: Medications Prior to Admission  Medication Sig Dispense Refill Last Dose   ARIPiprazole (ABILIFY) 10 MG tablet Take 1 tablet (10 mg total) by mouth daily. 30 tablet 0    benztropine (COGENTIN) 0.5 MG tablet Take 1 tablet (0.5 mg total) by mouth 2 (two) times daily. (Patient not taking: Reported on 12/12/2021) 60 tablet 0    hydrOXYzine (ATARAX/VISTARIL) 25 MG tablet Take 1 tablet (25 mg total) by mouth 3 (three) times daily as needed for up to 30 doses for anxiety. (Patient not taking: Reported on 12/12/2021) 30 tablet 0    paliperidone (INVEGA SUSTENNA) 156 MG/ML SUSY injection Inject 1 mL (156 mg total) into the muscle every 28 (twenty-eight) days. (Patient not taking: Reported on 12/12/2021) 1.2 mL 0    propranolol ER (INDERAL LA) 60 MG 24 hr capsule Take 1 capsule (60 mg total) by mouth daily. (Patient not taking: Reported on 12/12/2021) 30 capsule 0    risperiDONE (RISPERDAL M-TABS) 3 MG disintegrating tablet Take 1 tablet (3 mg total) by mouth at  bedtime. 5 tablet 0    traZODone (DESYREL) 50 MG tablet Take 1 tablet (50 mg total) by mouth at bedtime as needed for sleep. (Patient not taking: Reported on 12/12/2021) 30 tablet 0     Patient Stressors: Financial difficulties   Marital or family conflict   Medication change or noncompliance   Substance abuse    Patient Strengths: General fund of knowledge  Physical Health   Treatment Modalities: Medication Management, Group therapy, Case management,  1 to 1 session with clinician, Psychoeducation, Recreational therapy.   Physician Treatment Plan for Primary Diagnosis: Schizoaffective disorder, bipolar type ([place]) Long Term Goal(s): Improvement in symptoms so as ready for discharge   Short Term Goals: Ability to identify changes in lifestyle to reduce recurrence of condition will improve Ability to verbalize feelings will  improve Ability to disclose and discuss suicidal ideas Ability to demonstrate self-control will improve Ability to identify and develop effective coping behaviors will improve Ability to maintain clinical measurements within normal limits will improve Compliance with prescribed medications will improve  Medication Management: Evaluate patient's response, side effects, and tolerance of medication regimen.  Therapeutic Interventions: 1 to 1 sessions, Unit Group sessions and Medication administration.  Evaluation of Outcomes: Progressing  Physician Treatment Plan for Secondary Diagnosis: Principal Problem:   Schizoaffective disorder, bipolar type ([place]) Active Problems:   Cocaine abuse, continuous use ([place])   Homelessness   Cannabis abuse   Tobacco abuse  Long Term Goal(s): Improvement in symptoms so as ready for discharge   Short Term Goals: Ability to identify changes in lifestyle to reduce recurrence of condition will improve Ability to verbalize feelings will improve Ability to disclose and discuss suicidal ideas Ability to demonstrate self-control will improve Ability to identify and develop effective coping behaviors will improve Ability to maintain clinical measurements within normal limits will improve Compliance with prescribed medications will improve     Medication Management: Evaluate patient's response, side effects, and tolerance of medication regimen.  Therapeutic Interventions: 1 to 1 sessions, Unit Group sessions and Medication administration.  Evaluation of Outcomes: Progressing   RN Treatment Plan for Primary Diagnosis: Schizoaffective disorder, bipolar type ([place]) Long Term Goal(s): Knowledge of disease and therapeutic regimen to maintain health will improve  Short Term Goals: Ability to participate in decision making will improve, Ability to verbalize feelings will improve, and Ability to identify and develop effective coping behaviors will  improve  Medication Management: RN will administer medications as ordered by provider, will assess and evaluate patient's response and provide education to patient for prescribed medication. RN will report any adverse and/or side effects to prescribing provider.  Therapeutic Interventions: 1 on 1 counseling sessions, Psychoeducation, Medication administration, Evaluate responses to treatment, Monitor vital signs and CBGs as ordered, Perform/monitor CIWA, COWS, AIMS and Fall Risk screenings as ordered, Perform wound care treatments as ordered.  Evaluation of Outcomes: Progressing   LCSW Treatment Plan for Primary Diagnosis: Schizoaffective disorder, bipolar type ([place]) Long Term Goal(s): Safe transition to appropriate next level of care at discharge, Engage patient in therapeutic group addressing interpersonal concerns.  Short Term Goals: Engage patient in aftercare planning with referrals and resources, Increase social support, and Increase ability to appropriately verbalize feelings  Therapeutic Interventions: Assess for all discharge needs, 1 to 1 time with Social worker, Explore available resources and support systems, Assess for adequacy in community support network, Educate family and significant other(s) on suicide prevention, Complete Psychosocial Assessment, Interpersonal group therapy.  Evaluation of Outcomes: Progressing   Progress in Treatment:  Attending groups: Yes. Participating in groups: Yes. Taking medication as prescribed: Yes. Toleration medication: Yes. Family/Significant other contact made: No, will contact:  CSW will obtain consents Patient understands diagnosis: Yes. Discussing patient identified problems/goals with staff: Yes. Medical problems stabilized or resolved: Yes. Denies suicidal/homicidal ideation: Yes. Issues/concerns per patient self-inventory: No. Other: None  New problem(s) identified: No, Describe:  None  New Short Term/Long Term  Goal(s):medication stabilization, elimination of SI thoughts, development of comprehensive mental wellness plan.   Patient Goals:  "clear stressors in life."  Discharge Plan or Barriers: Patient recently admitted. CSW will continue to follow and assess for appropriate referrals and possible discharge planning.   Reason for Continuation of Hospitalization: Anxiety Medication stabilization Suicidal ideation  Estimated Length of Stay: 3-5 days   Scribe for Treatment Team: Eliott Nine 12/14/2021 3:15 PM

## 2021-12-14 NOTE — Group Note (Signed)
LCSW Group Therapy Note   Group Date: 12/14/2021 Start Time: 0945 End Time: 1045  Type of Therapy and Topic:  Group Therapy:  Setting Goals   Participation Level:  Active   Description of Group: In this process group, patients discussed using strengths to work toward goals and address challenges.  Patients identified two positive things about themselves and one goal they were working on.  Patients were given the opportunity to share openly and support each other's plan for self-empowerment.  The group discussed the value of gratitude and were encouraged to have a daily reflection of positive characteristics or circumstances.  Patients were encouraged to identify a plan to utilize their strengths to work on current challenges and goals.   Therapeutic Goals Patient will verbalize personal strengths/positive qualities and relate how these can assist with achieving desired personal goals Patients will verbalize affirmation of peers plans for personal change and goal setting Patients will explore the value of gratitude and positive focus as related to successful achievement of goals Patients will verbalize a plan for regular reinforcement of personal positive qualities and circumstances.   Summary of Patient Progress: Patient identified the definition of goals. Patients was given the opportunity to share openly and support other group members' plan for self-empowerment. Patient verbalized personal strength and how they relate to achieving the desired goal. Patient was able to identify positive goals to work towards when they return home.      Therapeutic Modalities Cognitive Behavioral Therapy Motivational Interviewing  Darleen Crocker, [place] 12/14/2021  11:14 AM

## 2021-12-15 MED ORDER — PROPRANOLOL HCL 10 MG PO TABS
10.0000 mg | ORAL_TABLET | Freq: Two times a day (BID) | ORAL | Status: DC
  Administered 2021-12-15 – 2021-12-16 (×2): 10 mg via ORAL
  Filled 2021-12-15: qty 14
  Filled 2021-12-15: qty 1
  Filled 2021-12-15: qty 14
  Filled 2021-12-15 (×3): qty 1

## 2021-12-15 MED ORDER — PROPRANOLOL HCL 10 MG PO TABS
10.0000 mg | ORAL_TABLET | Freq: Once | ORAL | Status: AC
  Administered 2021-12-15: 10 mg via ORAL
  Filled 2021-12-15: qty 1

## 2021-12-15 NOTE — [facility] Group Notes (Signed)
Goals Group 12/15/21    Group Focus: affirmation, clarity of thought, and goals/reality orientation Treatment Modality:  Psychoeducation Interventions utilized were assignment, group exercise, and support Purpose: To be able to understand and verbalize the reason for their admission to the hospital. To understand that the medication helps with their chemical imbalance but they also need to work on their choices in life. To be challenged to develop a list of 30 positives about themselves. Also introduce the concept that "feelings" are not reality.  Participation Level:  quiet  Participation Quality:  Appropriate  Affect:  Appropriate  Cognitive:  Appropriate  Insight:  Improving  Engagement in Group:  Engaged  Additional Comments: Pt sat quietly throughout the group. Did answer questions if they were addressed to him. Given support and appropriate feedback.  Paulino Rily

## 2021-12-15 NOTE — Progress Notes (Signed)
[facility] MD Progress Note  12/15/2021 8:45 AM Tyler White  MRN:  [id number] Subjective:  Tyler White is a 25 year old male with a psychiatric history of schizoaffective disorder- bipolar type, cocaine use disorder-severe/dependent, alcohol use disorder-mild, and tobacco use disorder-mild who presented to [facility] ED for SI with life stressors.    Chart Review of Past 24 hrs: The patient's chart was reviewed and nursing notes were reviewed. The patient's case was discussed in multidisciplinary team meeting.  Per MAR: - Patient is compliant with scheduled meds. - PRNs: Hydroxyzine 25 mg x 1. Per RN notes, no documented behavioral issues and is attending group.  Patient had the following psychiatric recommendations yesterday:  -Titrating Risperdal for psychotic symptoms and maintenance: 0.5 mg 12/16 AM and 1.5 mg 12/16 PM, 1 mg 12/17 AM and 2 mg 12/17 PM, 1.5 mg 12/18 AM and 2 mg 12/18 PM; 2 mg BID from there. - Continue Abilify 5 mg every morning for mood stabilization; Abilify 10 mg initiated in the ED; no indication to titrate up. - Continue home Cogentin 0.5 mg twice daily for EPS prophylaxis  On Today's Assessment (12/15/2021): Case was discussed in the multidisciplinary team. MAR was reviewed and patient was compliant with medications. Patient seen, assessed, and discussed with attending Dr. Berdine Addison.  He reports that he is doing well today, slept well last night, and appetite is good.  He reports no SI, HI, AVH, first rank symptoms, ideas of reference, or paranoia on the unit. He continues to maintain the goal of substance cessation so that he is able to see his daughter. Somatically, he reports some akathisia, but is agreeable to initiating Propranolol, as we did during his last admission. He continues to agree to the treatment plan, and maintains his desire to uphold his request for discharge, which our team does not contest.   Principal Problem: Schizoaffective disorder, bipolar type  ([place]) Diagnosis: Principal Problem:   Schizoaffective disorder, bipolar type ([place]) Active Problems:   Cocaine abuse, continuous use ([place])   Homelessness   Cannabis abuse   Tobacco abuse  Total Time spent with patient: 30 minutes  Past Psychiatric History: See H&P  Past Medical History:  Past Medical History:  Diagnosis Date   Hernia, inguinal, right    Inguinal hernia    right   Psychiatric illness    Schizophrenia ([place])    History reviewed. No pertinent surgical history. Family History:  Family History  Problem Relation Age of Onset   Psychiatric Illness Mother    Hypertension Sister    Family Psychiatric  History: See H&P Social History:  Social History   Substance and Sexual Activity  Alcohol Use Not Currently     Social History   Substance and Sexual Activity  Drug Use Not Currently   Types: Marijuana, Cocaine   Comment: denies currently    Social History   Socioeconomic History   Marital status: Single    Spouse name: Not on file   Number of children: Not on file   Years of education: 10   Highest education level: Not on file  Occupational History   Not on file  Tobacco Use   Smoking status: Every Day    Packs/day: 0.50    Years: 5.00    Pack years: 2.50    Types: Cigarettes   Smokeless tobacco: Never  Vaping Use   Vaping Use: Never used  Substance and Sexual Activity   Alcohol use: Not Currently   Drug use: Not Currently  Types: Marijuana, Cocaine    Comment: denies currently   Sexual activity: Yes    Birth control/protection: None  Other Topics Concern   Not on file  Social History Narrative   ** Merged History Encounter **       ** Merged History Encounter **       Social Determinants of Health   Financial Resource Strain: Not on file  Food Insecurity: Not on file  Transportation Needs: Not on file  Physical Activity: Not on file  Stress: Not on file  Social Connections: Not on file   Additional Social History:          Sleep: Good  Appetite:  Good  Current Medications: Current Facility-Administered Medications  Medication Dose Route Frequency Provider Last Rate Last Admin   acetaminophen (TYLENOL) tablet 650 mg  650 mg Oral Q6H PRN Rankin, Shuvon B, NP       alum & mag hydroxide-simeth (MAALOX/MYLANTA) 200-200-20 MG/5ML suspension 30 mL  30 mL Oral Q4H PRN Rankin, Shuvon B, NP       ARIPiprazole (ABILIFY) tablet 5 mg  5 mg Oral Daily Rosezetta Schlatter, MD   5 mg at 12/15/21 0800   benztropine (COGENTIN) tablet 0.5 mg  0.5 mg Oral BID Rankin, Shuvon B, NP   0.5 mg at 12/15/21 0759   hydrOXYzine (ATARAX) tablet 25 mg  25 mg Oral TID PRN Rankin, Shuvon B, NP       risperiDONE (RISPERDAL M-TABS) disintegrating tablet 2 mg  2 mg Oral Q8H PRN Massengill, Nathan, MD       And   LORazepam (ATIVAN) tablet 1 mg  1 mg Oral PRN Massengill, Ovid Curd, MD       And   ziprasidone (GEODON) injection 20 mg  20 mg Intramuscular PRN Massengill, Ovid Curd, MD       magnesium hydroxide (MILK OF MAGNESIA) suspension 30 mL  30 mL Oral Daily PRN Rankin, Shuvon B, NP       [START ON 12/16/2021] risperiDONE (RISPERDAL) tablet 1.5 mg  1.5 mg Oral Once Rosezetta Schlatter, MD       Followed by   Derrill Memo ON 12/17/2021] risperiDONE (RISPERDAL) tablet 2 mg  2 mg Oral Once Rosezetta Schlatter, MD       risperiDONE (RISPERDAL) tablet 2 mg  2 mg Oral QHS Rosezetta Schlatter, MD       traZODone (DESYREL) tablet 50 mg  50 mg Oral QHS PRN Rankin, Shuvon B, NP   50 mg at 12/13/21 2131    Lab Results:  Results for orders placed or performed during the hospital encounter of 12/12/21 (from the past 48 hour(s))  TSH     Status: None   Collection Time: 12/13/21  6:31 PM  Result Value Ref Range   TSH 0.802 0.350 - 4.500 uIU/mL    Comment: Performed by a 3rd Generation assay with a functional sensitivity of <=0.01 uIU/mL. Performed at [facility], [place] [address], [place], [place] [address]   Lipid panel     Status: None   Collection  Time: 12/13/21  6:31 PM  Result Value Ref Range   Cholesterol 161 0 - 200 mg/dL   Triglycerides 81 <150 mg/dL   HDL 75 >40 mg/dL   Total CHOL/HDL Ratio 2.1 RATIO   VLDL 16 0 - 40 mg/dL   LDL Cholesterol 70 0 - 99 mg/dL    Comment:        Total Cholesterol/HDL:CHD Risk Coronary Heart Disease Risk Table  Men   Women  1/2 Average Risk   3.4   3.3  Average Risk       5.0   4.4  2 X Average Risk   9.6   7.1  3 X Average Risk  23.4   11.0        Use the calculated Patient Ratio above and the CHD Risk Table to determine the patient's CHD Risk.        ATP III CLASSIFICATION (LDL):  <100     mg/dL   Optimal  100-129  mg/dL   Near or Above                    Optimal  130-159  mg/dL   Borderline  160-189  mg/dL   High  >190     mg/dL   Very High Performed at [facility] [address] [place], [place] [address]     Blood Alcohol level:  Lab Results  Component Value Date   ETH <10 12/12/2021   ETH <10 00/86/7619    Metabolic Disorder Labs: Lab Results  Component Value Date   HGBA1C 5.0 10/09/2021   MPG 96.8 10/09/2021   MPG 99.67 11/02/2020   Lab Results  Component Value Date   PROLACTIN 32.5 (H) 05/30/2018   Lab Results  Component Value Date   CHOL 161 12/13/2021   TRIG 81 12/13/2021   HDL 75 12/13/2021   CHOLHDL 2.1 12/13/2021   VLDL 16 12/13/2021   LDLCALC 70 12/13/2021   LDLCALC 96 10/09/2021    Physical Findings: AIMS: Facial and Oral Movements Muscles of Facial Expression: None, normal Lips and Perioral Area: None, normal Jaw: None, normal Tongue: None, normal,Extremity Movements Upper (arms, wrists, hands, fingers): None, normal Lower (legs, knees, ankles, toes): None, normal, Trunk Movements Neck, shoulders, hips: None, normal, Overall Severity Severity of abnormal movements (highest score from questions above): None, normal Incapacitation due to abnormal movements: None, normal Patient's awareness of  abnormal movements (rate only patient's report): No Awareness, Dental Status Current problems with teeth and/or dentures?: No Does patient usually wear dentures?: No    Musculoskeletal: Strength & Muscle Tone: within normal limits Gait & Station: normal Patient leans: N/A  Psychiatric Specialty Exam:  Presentation  General Appearance: Casual; Fairly Groomed  Eye Contact:Fair  Speech:Normal Rate  Speech Volume:Normal  Handedness:Right   Mood and Affect  Mood:Euthymic  Affect:Blunt   Thought Process  Thought Processes:Goal Directed; Linear  Descriptions of Associations:Intact  Orientation:Full (Time, Place and Person)  Thought Content:Logical  History of Schizophrenia/Schizoaffective disorder:Yes  Duration of Psychotic Symptoms:Greater than six months  Hallucinations:Hallucinations: -- (History of AVH; continues to deny today)  Ideas of Reference:None (Denies today)  Suicidal Thoughts:Suicidal Thoughts: No (Continues to deny today)  Homicidal Thoughts:Homicidal Thoughts: No   Sensorium  Memory:Immediate Good; Recent Good; Remote Good  Judgment:Fair  Insight:Fair (Improving)   Executive Functions  Concentration:Good  Attention Span:Good  [place] of Knowledge:Good  Language:Good   Psychomotor Activity  Psychomotor Activity:Psychomotor Activity: Restlessness   Assets  Assets:Communication Skills; Desire for Improvement; Physical Health; Resilience   Sleep  Sleep:Sleep: Fair    Physical Exam: Physical Exam Vitals and nursing note reviewed.  Constitutional:      General: He is not in acute distress.    Appearance: He is normal weight.  HENT:     Head: Normocephalic and atraumatic.     Mouth/Throat:     Mouth: Mucous membranes are moist.  Pharynx: Oropharynx is clear.  Pulmonary:     Effort: Pulmonary effort is normal.  Musculoskeletal:        General: Normal range of motion.  Skin:    General: Skin is warm and  dry.     Findings: No rash.  Neurological:     General: No focal deficit present.     Mental Status: He is alert and oriented to person, place, and time. Mental status is at baseline.     Gait: Gait normal.   Review of Systems  HENT:  Negative for congestion.   Cardiovascular:  Negative for chest pain.  Gastrointestinal: Negative.   Genitourinary: Negative.   Musculoskeletal: Negative.   Skin:  Negative for itching.  Neurological:  Negative for headaches.  Blood pressure 113/81, pulse (!) 101, temperature 97.9 F (36.6 C), temperature source Oral, resp. rate 20, height 5' 8.5" (1.74 m), weight 77.9 kg, SpO2 100 %. Body mass index is 25.74 kg/m.  ASSESSMENT: Principal Problem: schizoaffective disorder-bipolar type disorder (HCC)  Treatment Plan: Daily contact with patient to assess and evaluate symptoms and progress in treatment and Medication management   Safety and Monitoring: voluntarily admission to inpatient psychiatric unit for safety, stabilization and treatment Daily contact with patient to assess and evaluate symptoms and progress in treatment Patient's case to be discussed in multi-disciplinary team meeting Observation Level : q15 minute checks Vital signs: q12 hours Precautions: suicide, elopement, and assault   2. Psychiatric Diagnoses: #Schizoaffective disorder- BP type, current episode depressed; mood sx likely worsened by substance use Discharge medications: Abilify 10 mg daily, Cogentin 0.5 mg daily, Invega Sustenna 156 mg IM, Inderal 60 mg daily, Risperdal 3 mg qHS. Patient has not taken medications since 7 day sample provided at discharge ran out. -Titrating Risperdal for psychotic symptoms and maintenance: 0.5 mg 12/16 AM and 1.5 mg 12/16 PM (complete), 1 mg 12/17 AM and 2 mg 12/17 PM, 1.5 mg 12/18 AM and 2 mg 12/18 PM; 2 mg BID from there (will discharge on this regimen). - Continue Abilify 5 mg every morning for mood stabilization; Abilify 10 mg initiated in  the ED; no indication to titrate up. - Continue home Cogentin 0.5 mg twice daily for EPS prophylaxis   #Cocaine Use disorder- continuous use #Marijuana use disorder- in remission No withdrawal symptoms experienced at this time - Advised discontinuation of drug use.  We will offer patient substance use treatment options.  3. Medical Management: Covid negative CMP: Unremarkable CBC: unremarkable EtOH: <10 UDS: Positive cocaine and THC TSH: 0.802 A1C: 5.0% on 10/11 Lipids: WNL  #Elevated blood pressure without diagnosis of hypertension Most recent BP 131/76 - Continue to monitor; no acute interventions to be initiated at this time  Discharge Planning:              -- Social work and case management to assist with discharge planning and identification of hospital follow-up needs prior to discharge.              -- Estimated DoD: 12/19; 72-hour form signed today, 12/16.             -- Discharge Concerns: Need to establish a safety plan; Medication compliance and effectiveness             -- Discharge Goals: Return home with outpatient referrals for mental health follow-up including medication management/psychotherapy   Rosezetta Schlatter, MD 12/15/2021, 8:45 AM

## 2021-12-15 NOTE — Discharge Instructions (Signed)
Dear Mr. Shackleton, I am so glad you are feeling better and can be discharged today (12/15/2021)! You were admitted for Management of schizoaffective disorder-bipolar type and for resources.   Please see the following instructions: Please attend the scheduled appointment or make an appointment to follow-up with your psychiatrist, therapist, and the other specialists seen below. Please see your primary care / family doctor for your other medical issues, concerns and or health care needs. CONTINUE home meds (With changes): Abilify 5 mg daily, Risperdal 2 mg in the morning and 2 mg at bedtime, Cogentin 0.5 mg in the morning and 0.5 mg at bedtime, Propranolol 10 mg in the morning and 10 mg at bedtime.  Discuss with your outpatient provider about restarting your Invega sustenna injection.   Report any adverse effects and or reactions from the medicines to your outpatient provider promptly. Do not engage in alcohol and or illegal drug use while on prescription medicines. In the event of worsening symptoms, text or call the [facility] at 42, call 911, visit the [facility] located at [address] and/or go to the nearest ED for appropriate evaluation and treatment of symptoms.  It was a pleasure meeting you, Mr. Langdon.  I wish you and your family the best, and hope you stay happy and healthy!  France Ravens, MD 12/15/2021

## 2021-12-15 NOTE — Progress Notes (Signed)
°   12/15/21 0800  Psych Admission Type (Psych Patients Only)  Admission Status Voluntary  Psychosocial Assessment  Patient Complaints Anxiety  Eye Contact Poor  Facial Expression Sad;Flat  Affect Sad;Flat  Speech Logical/coherent;Soft  Interaction Cautious;Minimal  Motor Activity Slow  Appearance/Hygiene Disheveled;Poor hygiene  Behavior Characteristics Cooperative  Mood Depressed;Anxious  Thought Process  Coherency Concrete thinking  Content WDL  Delusions None reported or observed  Perception WDL  Hallucination None reported or observed  Judgment Impaired  Confusion Mild  Danger to Self  Current suicidal ideation? Denies  Danger to Others  Danger to Others None reported or observed   D: Patient denies SI/HI/AVH. Pt. Denied anxiety and depression in the early morning, later pt. Reported anxiety 6/10 because "people aren't answering the phone." MHT reported pt. Had been rocking back and forth at the phone.  A:  Patient took scheduled medicine. Pt. Given 25 mg of vistaril for anxiety. Support and encouragement provided Routine safety checks conducted every 15 minutes. Patient  Informed to notify staff with any concerns.   R:  Safety maintained.

## 2021-12-15 NOTE — [facility] Group Notes (Signed)
Psychoeducational Group Note    Date:12/17//2022 Time: 1300-1400    Purpose of Group: . The group focus' on teaching patients on how to identify their needs and their Life Skills:  A group where two lists are made. What people need and what are things that we do that are unhealthy. The lists are developed by the patients and it is explained that we often do the actions that are not healthy to get our list of needs met.  Goal:: to develop the coping skills needed to get their needs met  Participation Level:  Active  Participation Quality:  Appropriate  Affect:  Appropriate  Cognitive:  Oriented  Insight:  Improving  Engagement in Group:  Engaged  Additional Comments:  Rates his energy at a 7/10. Participated fullyin the group and helped develop the lists. Shared his thoughts and ideas with the group. Given feedback as well as shared feedback  Paulino Rily

## 2021-12-15 NOTE — Group Note (Signed)
LCSW Group Therapy Note  Group Date: 12/15/2021 Start Time: 1000 End Time: 1100   Type of Therapy and Topic:  Group Therapy: Anger Cues and Responses  Participation Level:  Did Not Attend   Description of Group:   In this group, patients learned how to recognize the physical, cognitive, emotional, and behavioral responses they have to anger-provoking situations.  They identified a recent time they became angry and how they reacted.  They analyzed how their reaction was possibly beneficial and how it was possibly unhelpful.  The group discussed a variety of healthier coping skills that could help with such a situation in the future.  Focus was placed on how helpful it is to recognize the underlying emotions to our anger, because working on those can lead to a more permanent solution as well as our ability to focus on the important rather than the urgent.  Therapeutic Goals: Patients will remember their last incident of anger and how they felt emotionally and physically, what their thoughts were at the time, and how they behaved. Patients will identify how their behavior at that time worked for them, as well as how it worked against them. Patients will explore possible new behaviors to use in future anger situations. Patients will learn that anger itself is normal and cannot be eliminated, and that healthier reactions can assist with resolving conflict rather than worsening situations.  Summary of Patient Progress:  Tyler White was invited to group, did not attend.  Therapeutic Modalities:   Cognitive Behavioral Therapy    Marquette Old 12/15/2021  2:54 PM

## 2021-12-15 NOTE — Progress Notes (Signed)
[facility] Group Notes:  (Nursing/MHT/Case Management/Adjunct)  Date:  12/15/2021  Time:  2015  Type of Therapy:   wrap up group  Participation Level:  Active  Participation Quality:  Appropriate, Attentive, Sharing, and Supportive  Affect:  Flat  Cognitive:  Alert  Insight:  Improving  Engagement in Group:  Engaged  Modes of Intervention:  Clarification, Education, and Socialization  Summary of Progress/Problems: Positive thinking and self-care were discussed.   Shellia Cleverly 12/15/2021, 11:30 PM

## 2021-12-15 NOTE — Discharge Summary (Signed)
Physician Discharge Summary Note  Patient:  Tyler White is an 25 y.o., male MRN:  [id number] DOB:  13-Jun-1996 Patient phone:  [phone] (home)  Patient address:   [place] [address],  Total Time spent with patient: 20 minutes  Date of Admission:  12/12/2021 Date of Discharge: 12/16/2021  Reason for Admission:  Per H&P- " Tyler White is a 25 year old male with a psychiatric history of schizoaffective disorder- bipolar type, cocaine use disorder-severe/dependent, alcohol use disorder-mild, and tobacco use disorder-mild who presented to [facility] ED for SI with life stressors.    Per chart review, Tyler White has had multiple inpatient psychiatric admissions since 2018, mostly 2/2 substance-induced mood disorder. Tyler White says that his AH began at age 51; collateral from a previous admission stated, "Tyler White states that the patient's issues began when "he got laced" with some substance in high school. Tyler White says he has never been the same since and the rocking back and forth and hearing voices began at that time." Tyler White was discharged from [facility] on 10/25 after a 10 day admission for complaints of worsening depression and AH. Since that admission, patient presented to [facility] White for somatic sx after using cocaine and smoking marijuana.    On assessment today, Tyler White reports recent stressors of losing his housing and being unable to provide for his 46-year-old daughter this holiday season, leading to [facility] White and a suicide attempt 2 days ago in which he wrapped a cord around his neck and went to [facility] ED before significantly harming himself.  Patient reports that he has been without medication since the samples ran out after his last admission.  He endorses sobriety from drug use until yesterday, when he used crack cocaine.  He is goal oriented, and wants to "get myself together to be there for my daughter."  As well, he wants help with housing this admission.   Today, he continues to report thoughts of harming himself,  but denies HI.  He reports no AH since October but VH which seem to be consistent with floaters rather than true hallucinations.  He reports poor sleep, mostly secondary to cold weather at night, poor energy (increased when using cocaine), poor appetite, and poor focus.  He reports that when he used crack yesterday, he was elevated, but crashed as the drug wore off.  He denies further manic symptoms at this time.  He endorses some thought broadcasting, but denies thought insertion/withdrawal; as well, he is not receiving messages through electronics.  He denies paranoia.  Somatically, he reports constipation with his last bowel movement 2 days ago and vision changes consistent with floaters, but denies all other symptoms, including headaches, diarrhea, dizziness, GI upset, chest pain, shortness of breath."  Principal Problem: Schizoaffective disorder, bipolar type [facility]) Discharge Diagnoses: Principal Problem:   Schizoaffective disorder, bipolar type (Tyler White) Active Problems:   Cocaine abuse, continuous use ([place])   Homelessness   Cannabis abuse   Tobacco abuse   Past Psychiatric History: See H&P  Past Medical History:  Past Medical History:  Diagnosis Date   Hernia, inguinal, right    Inguinal hernia    right   Psychiatric illness    Schizophrenia ([place])    History reviewed. No pertinent surgical history. Family History:  Family History  Problem Relation Age of Onset   Psychiatric Illness Mother    Hypertension Sister    Family Psychiatric  History: See H&P Social History:  Social History   Substance and Sexual Activity  Alcohol Use Not Currently  Social History   Substance and Sexual Activity  Drug Use Not Currently   Types: Marijuana, Cocaine   Comment: denies currently    Social History   Socioeconomic History   Marital status: Single    Spouse name: Not on file   Number of children: Not on file   Years of education: 10   Highest education level: Not on file   Occupational History   Not on file  Tobacco Use   Smoking status: Every Day    Packs/day: 0.50    Years: 5.00    Pack years: 2.50    Types: Cigarettes   Smokeless tobacco: Never  Vaping Use   Vaping Use: Never used  Substance and Sexual Activity   Alcohol use: Not Currently   Drug use: Not Currently    Types: Marijuana, Cocaine    Comment: denies currently   Sexual activity: Yes    Birth control/protection: None  Other Topics Concern   Not on file  Social History Narrative   ** Merged History Encounter **       ** Merged History Encounter **       Social Determinants of White   Financial Resource Strain: Not on file  Food Insecurity: Not on file  Transportation Needs: Not on file  Physical Activity: Not on file  Stress: Not on file  Social Connections: Not on file    White Course:  After the above admission evaluation,  Tyler White's presenting symptoms were noted. He was recommended for mood stabilization treatments. The medication regimen targeting those presenting symptoms were discussed with him & initiated with his consent: Restarted meds from previous admission- Risperdal 0.5 mg AM and 1 mg HS (up-titrated to 2 mg BID); Abilify 10 mg initiated in the ED was decreased to Abilify 5 mg daily as patient did not appear to be psychotic with a great deal of internal preoccupation. Patient with akathisia, and re-initiated on Propranolol 10 mg BID on 12/17, as BP could tolerate.   His UDS on arrival to the ED was positive cocaine and THC, BAL negative however, he did not develop any alcohol withdrawal symptoms & did not receive alcohol detoxification treatments. He was however medicated, stabilized & discharged on the medications as listed on his discharge medication lists below. Besides the mood stabilization treatments  Tyler White  was also enrolled & participated in the group counseling sessions being offered & held on this unit. He learned coping skills. He presented no other  significant pre-existing medical issues that required treatment. He tolerated his treatment regimen without any adverse effects or reactions reported.   During the course of his hospitalization, the 15-minute checks were adequate to ensure patient's safety.  Rawad  did not display any dangerous, violent or suicidal behavior on the unit.  He interacted with patients & staff appropriately, participated appropriately in the group sessions/therapies. His medications were addressed & adjusted to meet his needs. He was recommended for outpatient follow-up care for therapy & medication management upon discharge to assure continuity of care & mood stability.  At the time of discharge patient is not reporting any acute suicidal/homicidal ideations. He feels more confident about his self-care & in managing his mental White. He currently denies any new issues or concerns. Education and supportive counseling provided throughout his White stay & upon discharge.   Today upon his discharge evaluation with the attending psychiatrist  Charon  shares he is doing well. He denies any other specific concerns. He is sleeping well. His appetite  is good. He denies other physical complaints. He denies AH/VH, delusional thoughts or paranoia. He does not appear to be responding to any internal stimuli. He feels that his medications have been helpful & is in agreement to continue his current treatment regimen as recommended. He was able to engage in safety planning including plan to return to [facility] or contact emergency White if he feels unable to maintain his own safety or the safety of others. Patient had no further questions, comments, or concerns. He left [place] [facility] with all personal belongings in no apparent distress. Transportation per [employer].    Physical Findings: AIMS: Facial and Oral Movements Muscles of Facial Expression: None, normal Lips and Perioral Area: None, normal Jaw: None, normal Tongue: None,  normal,Extremity Movements Upper (arms, wrists, hands, fingers): None, normal Lower (legs, knees, ankles, toes): None, normal, Trunk Movements Neck, shoulders, hips: None, normal, Overall Severity Severity of abnormal movements (highest score from questions above): None, normal Incapacitation due to abnormal movements: None, normal Patient's awareness of abnormal movements (rate only patient's report): No Awareness, Dental Status Current problems with teeth and/or dentures?: No Does patient usually wear dentures?: No    Musculoskeletal: Strength & Muscle Tone: within normal limits Gait & Station: normal Patient leans: N/A   Psychiatric Specialty Exam:  Presentation  General Appearance: Appropriate for Environment  Eye Contact:Fair  Speech:Clear and Coherent  Speech Volume:Normal  Handedness:Right   Mood and Affect  Mood:Anxious  Affect:Blunt   Thought Process  Thought Processes:Goal Directed  Descriptions of Associations:Intact  Orientation:Full (Time, Place and Person)  Thought Content:Logical  History of Schizophrenia/Schizoaffective disorder:Yes  Duration of Psychotic Symptoms:Greater than six months  Hallucinations:Hallucinations: None  Ideas of Reference:None  Suicidal Thoughts:Suicidal Thoughts: No  Homicidal Thoughts:Homicidal Thoughts: No   Sensorium  Memory:Immediate Fair  Judgment:Fair  Insight:Fair   Executive Functions  Concentration:Fair  Attention Span:Fair  [place]   Psychomotor Activity  Psychomotor Activity:Psychomotor Activity: Normal   Assets  Assets:Leisure Time; Physical White   Sleep  Sleep:Sleep: Fair    Physical Exam: Physical Exam Vitals and nursing note reviewed.  Constitutional:      Appearance: Normal appearance. He is normal weight.  HENT:     Head: Normocephalic and atraumatic.  Pulmonary:     Effort: Pulmonary effort is normal.  Neurological:      General: No focal deficit present.     Mental Status: He is oriented to person, place, and time.   Review of Systems  Respiratory:  Negative for shortness of breath.   Cardiovascular:  Negative for chest pain.  Gastrointestinal:  Negative for abdominal pain, constipation, diarrhea, heartburn, nausea and vomiting.  Neurological:  Negative for headaches.  Blood pressure 128/79, pulse 92, temperature (!) 96.8 F (36 C), resp. rate 18, height 5' 8.5" (1.74 m), weight 77.9 kg, SpO2 100 %. Body mass index is 25.74 kg/m.   Social History   Tobacco Use  Smoking Status Every Day   Packs/day: 0.50   Years: 5.00   Pack years: 2.50   Types: Cigarettes  Smokeless Tobacco Never   Tobacco Cessation:  A prescription for an FDA-approved tobacco cessation medication was offered at discharge and the patient refused   Blood Alcohol level:  Lab Results  Component Value Date   [facility] <10 12/12/2021   ETH <10 83/15/1761    Metabolic Disorder Labs:  Lab Results  Component Value Date   HGBA1C 5.0 10/09/2021   MPG 96.8 10/09/2021   MPG 99.67 11/02/2020  Lab Results  Component Value Date   PROLACTIN 32.5 (H) 05/30/2018   Lab Results  Component Value Date   CHOL 161 12/13/2021   TRIG 81 12/13/2021   HDL 75 12/13/2021   CHOLHDL 2.1 12/13/2021   VLDL 16 12/13/2021   LDLCALC 70 12/13/2021   LDLCALC 96 10/09/2021    See Psychiatric Specialty Exam and Suicide Risk Assessment completed by Attending Physician prior to discharge.  Discharge destination:  Other:  shelter  Is patient on multiple antipsychotic therapies at discharge:  No   Has Patient had three or more failed trials of antipsychotic monotherapy by history:  No  Recommended Plan for Multiple Antipsychotic Therapies: NA   Allergies as of 12/16/2021   No Known Allergies      Medication List     STOP taking these medications    hydrOXYzine 25 MG tablet Commonly known as: ATARAX   paliperidone 156 MG/ML Susy  injection Commonly known as: INVEGA SUSTENNA   propranolol ER 60 MG 24 hr capsule Commonly known as: INDERAL LA Replaced by: propranolol 10 MG tablet   risperiDONE 3 MG disintegrating tablet Commonly known as: RISPERDAL M-TABS Replaced by: risperiDONE 2 MG tablet   traZODone 50 MG tablet Commonly known as: DESYREL       TAKE these medications      Indication  ARIPiprazole 5 MG tablet Commonly known as: ABILIFY Take 1 tablet (5 mg total) by mouth daily. What changed:  medication strength how much to take  Indication: Manic Phase of Manic-Depression, Schizophrenia   benztropine 0.5 MG tablet Commonly known as: COGENTIN Take 1 tablet (0.5 mg total) by mouth 2 (two) times daily.  Indication: Extrapyramidal Reaction caused by Medications   propranolol 10 MG tablet Commonly known as: INDERAL Take 1 tablet (10 mg total) by mouth 2 (two) times daily. Replaces: propranolol ER 60 MG 24 hr capsule  Indication: Neuroleptic-Induced Akathisia   risperiDONE 2 MG tablet Commonly known as: RISPERDAL Take 1 tablet (2 mg total) by mouth 2 (two) times daily. Replaces: risperiDONE 3 MG disintegrating tablet  Indication: MIXED BIPOLAR AFFECTIVE DISORDER, Schizophrenia        Follow-up Information     [place]. Go to.   Specialty: Behavioral White Why: Please go to this provider for therapy and medication management White during walk in hours:  Monday through Wednesday, from 7:45 am to 11:00 am.  White are provided on a first come, first served basis. Contact information: [place] [address] [place] Follow up.   Why: You may go to this facility for low to no cost medications. Contact information: [place] [address] [place] Follow up.   Why: Please continue to follow up with this facility  concerning your disability application and court dates. Contact information: [address], [place], [place] [address] P: [phone]                Follow-up recommendations:  Activity:  Normal, as tolerated Diet:  Per PCP recommendation  Patient is instructed prior to discharge to: Take all medications as prescribed by his mental healthcare provider. Report any adverse effects and/or reactions from the medicines to his outpatient provider promptly. Patient has been instructed & cautioned: To not engage in alcohol and or illegal drug use while on prescription medicines.  In the event  of worsening symptoms, patient is instructed to call the crisis hotline at 988, 911 and or go to the nearest ED for appropriate evaluation and treatment of symptoms. To follow-up with his primary care provider for your other medical issues, concerns and or White care needs.   Signed: France Ravens, MD 12/16/2021, 10:40 AM

## 2021-12-15 NOTE — Progress Notes (Signed)
D) Pt received calm, visible, participating in milieu, and in no acute distress. Pt A & O x4. Pt denies SI, HI, A/ V H, depression, anxiety and pain at this time. A) Pt encouraged to drink fluids. Pt encouraged to come to staff with needs. Pt encouraged to attend and participate in groups. Pt encouraged to set reachable goals.  R) Pt remained safe on unit, in no acute distress, will continue to assess.      12/14/21 1930  Psych Admission Type (Psych Patients Only)  Admission Status Voluntary  Psychosocial Assessment  Patient Complaints Depression  Eye Contact Poor  Facial Expression Sad;Flat  Affect Sad;Flat  Speech Logical/coherent;Soft  Interaction Cautious;Minimal  Motor Activity Slow  Appearance/Hygiene Disheveled;Poor hygiene  Behavior Characteristics Cooperative  Mood Depressed  Thought Process  Coherency Concrete thinking  Content WDL  Delusions None reported or observed  Perception Hallucinations  Hallucination Auditory  Judgment Impaired  Confusion Mild  Danger to Self  Current suicidal ideation? Denies  Danger to Others  Danger to Others None reported or observed

## 2021-12-16 MED ORDER — PROPRANOLOL HCL 10 MG PO TABS: 10.0000 mg | ORAL_TABLET | Freq: Two times a day (BID) | ORAL | 0 refills | Status: DC

## 2021-12-16 MED ORDER — BENZTROPINE MESYLATE 0.5 MG PO TABS: 0.5000 mg | ORAL_TABLET | Freq: Two times a day (BID) | ORAL | 0 refills | Status: DC

## 2021-12-16 MED ORDER — ARIPIPRAZOLE 5 MG PO TABS
5.0000 mg | ORAL_TABLET | Freq: Every day | ORAL | 0 refills | Status: DC
Start: 2021-12-16 — End: 2022-02-08

## 2021-12-16 MED ORDER — RISPERIDONE 2 MG PO TABS: 2.0000 mg | ORAL_TABLET | Freq: Two times a day (BID) | ORAL | 0 refills | Status: DC

## 2021-12-16 MED ORDER — RISPERIDONE 2 MG PO TABS
2.0000 mg | ORAL_TABLET | Freq: Two times a day (BID) | ORAL | Status: DC
  Filled 2021-12-16 (×4): qty 14

## 2021-12-16 NOTE — Progress Notes (Signed)
D:  Patient denied SI and HI, contracts for safety.  Denied A/V hallucinations.  Denied pain. A:  Medications administered per MD orders.  Emotional support and encouragement given patient. R:  Safety maintained with 15 minute checks.  

## 2021-12-16 NOTE — BHH Suicide Risk Assessment (Signed)
[facility] Discharge Suicide Risk Assessment   Principal Problem: Schizoaffective disorder, bipolar type ([place]) Discharge Diagnoses: Principal Problem:   Schizoaffective disorder, bipolar type ([place]) Active Problems:   Cocaine abuse, continuous use ([place])   Homelessness   Cannabis abuse   Tobacco abuse   Total Time spent with patient: 20 minutes  Musculoskeletal: Strength & Muscle Tone: within normal limits Gait & Station: normal Patient leans: N/A  Psychiatric Specialty Exam  Presentation  General Appearance: Appropriate for Environment  Eye Contact:Fair  Speech:Clear and Coherent  Speech Volume:Normal  Handedness:Right   Mood and Affect  Mood:Anxious  Duration of Depression Symptoms: Greater than two weeks  Affect:Blunt   Thought Process  Thought Processes:Goal Directed  Descriptions of Associations:Intact  Orientation:Full (Time, Place and Person)  Thought Content:Logical  History of Schizophrenia/Schizoaffective disorder:Yes  Duration of Psychotic Symptoms:Greater than six months  Hallucinations:Hallucinations: None  Ideas of Reference:None  Suicidal Thoughts:Suicidal Thoughts: No  Homicidal Thoughts:Homicidal Thoughts: No   Sensorium  Memory:Immediate Fair  Judgment:Fair  Insight:Fair   Executive Functions  Concentration:Fair  Attention Span:Fair  [place]   Psychomotor Activity  Psychomotor Activity:Psychomotor Activity: Normal   Assets  Assets:Leisure Time; Physical Health   Sleep  Sleep:Sleep: Fair   Physical Exam: Physical Exam Vitals and nursing note reviewed.  Constitutional:      Appearance: Normal appearance.  HENT:     Head: Normocephalic.     Nose: Nose normal.  Eyes:     Extraocular Movements: Extraocular movements intact.  Pulmonary:     Effort: Pulmonary effort is normal.  Musculoskeletal:        General: Normal range of motion.     Cervical back: Normal  range of motion.  Neurological:     General: No focal deficit present.     Mental Status: He is alert and oriented to person, place, and time.  Psychiatric:        Attention and Perception: Attention normal.        Mood and Affect: Mood is anxious. Affect is blunt.        Speech: Speech normal.        Behavior: Behavior normal. Behavior is cooperative.        Thought Content: Thought content normal.        Cognition and Memory: Cognition normal.        Judgment: Judgment normal.   ROS Blood pressure 128/79, pulse 92, temperature (!) 96.8 F (36 C), resp. rate 18, height 5' 8.5" (1.74 m), weight 77.9 kg, SpO2 100 %. Body mass index is 25.74 kg/m.  Mental Status Per Nursing Assessment::   On Admission:  Suicidal ideation indicated by patient  Demographic Factors:  Male, Adolescent or young adult, Low socioeconomic status, Living alone, and Unemployed  Loss Factors: Decrease in vocational status and Legal issues  Historical Factors: Prior suicide attempts and Impulsivity  Risk Reduction Factors:   Responsible for children under 38 years of age  Continued Clinical Symptoms:  Schizophrenia:   Less than 8 years old Paranoid or undifferentiated type  Cognitive Features That Contribute To Risk:  Closed-mindedness and Thought constriction (tunnel vision)    Suicide Risk:  Mild:  Suicidal ideation of limited frequency, intensity, duration, and specificity.  There are no identifiable plans, no associated intent, mild dysphoria and related symptoms, good self-control (both objective and subjective assessment), few other risk factors, and identifiable protective factors, including available and accessible social support.   Follow-up Information     [place]  [facility]. Go to.   Specialty: Behavioral Health Why: Please go to this provider for therapy and medication management services during walk in hours:  Monday through Wednesday, from 7:45 am to 11:00 am.   Services are provided on a first come, first served basis. Contact information: [place] [address] [place] Follow up.   Why: You may go to this facility for low to no cost medications. Contact information: [place] [address] [place] Follow up.   Why: Please continue to follow up with this facility concerning your disability application and court dates. Contact information: [address] [place], [place] [address] P: [phone]                Plan Of Care/Follow-up recommendations:  Activity:  ad lib Diet:  regular Other:    Prescriptions for new medications provided for the patient to bridge to follow up appointment. The patient was informed that refills for these prescriptions are generally not provided, and patient is encouraged to attend all follow up appointments to address medication refills and adjustments.   Today's discharge was reviewed with treatment team, and the team is in agreement that the patient is ready for discharge. The patient is was of the discharge plan for today and has been given opportunity to ask questions. At time of discharge, the patient does not vocalize any acute harm to self or others, is goal directed, able to advocate for self and organizational baseline.   At discharge, the patient is instructed to:  Take all medications as prescribed. Report any adverse effects and or reactions from the medicines to her outpatient provider promptly.  Do not engage in alcohol and/or illegal drug use while on prescription medicines.  In the event of worsening symptoms, patient is instructed to call the crisis hotline, 911 and or go to the nearest ED for appropriate evaluation and treatment of symptoms.  Follow-up with primary care provider for further care of medical issues, concerns and or health care  needs. * Substance abuse follow up: it is recommended that you follow up with community support treatment, like AA/NA. It is also recommended that the patient attend 90 meetings in 90 days, otherwise known as "90 in [address]"   Maida Sale, MD 12/16/2021, 10:04 AM

## 2021-12-16 NOTE — Plan of Care (Signed)
Nurse discussed coping skills with patient.  

## 2021-12-16 NOTE — Progress Notes (Signed)
Discharge Note:  Patient discharged with bus tickets to friend's home.  Suicide prevention information given and discussed with patient who stated he understood and had no questions.  Denied SI and HI.  Denied A/V hallucinations.  Patient stated he received all his belongings, clothing, toiletries, misc items, etc.  Patient stated he appreciated all assistance received from [facility] staff.  All required discharge information given.

## 2021-12-16 NOTE — Group Note (Signed)
Date:  12/16/2021 Time:  9:57 AM  Group Topic/Focus:  Orientation:   The focus of this group is to educate the patient on the purpose and policies of crisis stabilization and provide a format to answer questions about their admission.  The group details unit policies and expectations of patients while admitted.    Participation Level:  Active  Participation Quality:  Appropriate  Affect:  Appropriate  Cognitive:  Appropriate  Insight: Appropriate  Engagement in Group:  Engaged  Modes of Intervention:  Discussion  Additional Comments:  Pt wants to work on discharge planning so he can get a Christmas gift for his daughter.  Garvin Fila 12/16/2021, 9:57 AM

## 2021-12-16 NOTE — BHH Group Notes (Signed)
Adult Psychoeducational Group Not Date:  12/16/2021 Time:  0900-1045 Group Topic/Focus: PROGRESSIVE RELAXATION. A group where deep breathing is taught and tensing and relaxation muscle groups is used. Imagery is used as well.  Pts are asked to imagine 3 pillars that hold them up when they are not able to hold themselves up.  Participation Level:  Active  Participation Quality:  Appropriate  Affect:  Appropriate  Cognitive:  Oriented  Insight: Improving  Engagement in Group:  Engaged  Modes of Intervention:  Activity, Discussion, Education, and Support  Additional Comments:  Rates his energy at a 10. Left the room before stating what holds him up  [facility] A

## 2021-12-16 NOTE — Group Note (Signed)
[facility] Group Notes: (Clinical Social Work)   12/16/2021      Type of Therapy:  Group Therapy   Participation Level:  Did Not Attend - was discharged during group.   Selmer Dominion, LCSW 12/16/2021, 12:02 PM

## 2021-12-16 NOTE — Progress Notes (Signed)
°  [facility] Adult Case Management Discharge Plan :  Will you be returning to the same living situation after discharge:  No.  Is homeless and plans to contact friends to find a place he can stay with a friend temporarily At discharge, do you have transportation home?: No.  Bus pass provided and explanation to get to bus stop given by CSW Do you have the ability to pay for your medications: No.  Patient has Medicaid but no income   Release of information consent forms completed and emailed to Medical Records, then turned in to Medical Records by CSW.   Patient to Follow up at:  [facility]. Go to.   Specialty: Behavioral Health Why: Please go to this provider for therapy and medication management services during walk in hours:  Monday through Wednesday, from 7:45 am to 11:00 am.  Services are provided on a first come, first served basis. Contact information: [place] [address] [place] Follow up.   Why: You may go to this facility for low to no cost medications. Contact information: [place] [id number] [place] Follow up.   Why: Please continue to follow up with this facility concerning your disability application and court dates. Contact information: [address] [place], [place] [address] P: [phone]                Next level of care provider has access to [place] and Suicide Prevention discussed: No.  Patient did not know uncle's number.     Has patient been referred to the Quitline?: Patient refused referral  Patient has been referred for addiction treatment: Yes  outpatient  Maretta Los, LCSW 12/16/2021, 9:45 AM

## 2022-01-28 ENCOUNTER — Encounter (HOSPITAL_COMMUNITY): Payer: Self-pay | Admitting: Emergency Medicine

## 2022-01-28 ENCOUNTER — Emergency Department (HOSPITAL_COMMUNITY)
Admission: EM | Admit: 2022-01-28 | Discharge: 2022-01-29 | Disposition: A | Discharge status: Home / Self Care | Payer: Medicaid Other | Source: Home / Self Care | Attending: Emergency Medicine | Admitting: Emergency Medicine

## 2022-01-28 ENCOUNTER — Other Ambulatory Visit: Payer: Self-pay

## 2022-01-28 DIAGNOSIS — R443 Hallucinations, unspecified: Secondary | ICD-10-CM

## 2022-01-28 DIAGNOSIS — R45851 Suicidal ideations: Secondary | ICD-10-CM

## 2022-01-28 DIAGNOSIS — Z20822 Contact with and (suspected) exposure to covid-19: Secondary | ICD-10-CM | POA: Insufficient documentation

## 2022-01-28 DIAGNOSIS — R440 Auditory hallucinations: Secondary | ICD-10-CM | POA: Insufficient documentation

## 2022-01-28 DIAGNOSIS — Z5321 Procedure and treatment not carried out due to patient leaving prior to being seen by health care provider: Secondary | ICD-10-CM | POA: Insufficient documentation

## 2022-01-28 LAB — CBC WITH DIFFERENTIAL/PLATELET
Abs Immature Granulocytes: 0.02 10*3/uL (ref 0.00–0.07)
Basophils Absolute: 0.1 10*3/uL (ref 0.0–0.1)
Basophils Relative: 1 %
Eosinophils Absolute: 0.1 10*3/uL (ref 0.0–0.5)
Eosinophils Relative: 1 %
HCT: 42.1 % (ref 39.0–52.0)
Hemoglobin: 14.3 g/dL (ref 13.0–17.0)
Immature Granulocytes: 0 %
Lymphocytes Relative: 30 %
Lymphs Abs: 2.4 10*3/uL (ref 0.7–4.0)
MCH: 27.7 pg (ref 26.0–34.0)
MCHC: 34 g/dL (ref 30.0–36.0)
MCV: 81.4 fL (ref 80.0–100.0)
Monocytes Absolute: 0.5 10*3/uL (ref 0.1–1.0)
Monocytes Relative: 6 %
Neutro Abs: 5 10*3/uL (ref 1.7–7.7)
Neutrophils Relative %: 62 %
Platelets: 262 10*3/uL (ref 150–400)
RBC: 5.17 MIL/uL (ref 4.22–5.81)
RDW: 13.7 % (ref 11.5–15.5)
WBC: 8.1 10*3/uL (ref 4.0–10.5)
nRBC: 0 % (ref 0.0–0.2)

## 2022-01-28 LAB — RESP PANEL BY RT-PCR (FLU A&B, COVID) ARPGX2
Influenza A by PCR: NEGATIVE
Influenza B by PCR: NEGATIVE
SARS Coronavirus 2 by RT PCR: NEGATIVE

## 2022-01-28 LAB — COMPREHENSIVE METABOLIC PANEL
ALT: 27 U/L (ref 0–44)
AST: 34 U/L (ref 15–41)
Albumin: 4.2 g/dL (ref 3.5–5.0)
Alkaline Phosphatase: 54 U/L (ref 38–126)
Anion gap: 10 (ref 5–15)
BUN: 22 mg/dL — ABNORMAL HIGH (ref 6–20)
CO2: 23 mmol/L (ref 22–32)
Calcium: 9.3 mg/dL (ref 8.9–10.3)
Chloride: 105 mmol/L (ref 98–111)
Creatinine, Ser: 1.17 mg/dL (ref 0.61–1.24)
GFR, Estimated: >60 mL/min (ref >60)
Glucose, Bld: 84 mg/dL (ref 70–99)
Potassium: 4.1 mmol/L (ref 3.5–5.1)
Sodium: 138 mmol/L (ref 135–145)
Total Bilirubin: 0.7 mg/dL (ref 0.3–1.2)
Total Protein: 7.1 g/dL (ref 6.5–8.1)

## 2022-01-28 LAB — RAPID URINE DRUG SCREEN, HOSP PERFORMED
Amphetamines: NOT DETECTED
Barbiturates: NOT DETECTED
Benzodiazepines: NOT DETECTED
Cocaine: POSITIVE — AB
Opiates: NOT DETECTED
Tetrahydrocannabinol: NOT DETECTED

## 2022-01-28 LAB — ETHANOL: Alcohol, Ethyl (B): <10 mg/dL (ref <10)

## 2022-01-28 NOTE — BH Assessment (Signed)
Clinician reviewed pt's chart in preparation to complete pt's MH Assessment. However, pt is currently in the lobby and cannot be seen at this time. TTS to attempt assessment after pt has been roomed.

## 2022-01-28 NOTE — ED Provider Triage Note (Signed)
Emergency Medicine Provider Triage Evaluation Note  Tyler White , a 26 y.o. male  was evaluated in triage.  Pt complains of suicidal ideation and suicide attempt earlier today.  Complaining visual and auditory hallucinations, intermittent homicidal ideation.  Review of Systems  As above  Physical Exam  BP (!) 114/102    Pulse 69    Temp 98 F (36.7 C)    Resp 16    Ht 5\' 9"  (1.753 m)    Wt 77.1 kg    SpO2 100%    BMI 25.10 kg/m  Gen:   Awake, no distress   Resp:  Normal effort  MSK:   Moves extremities without difficulty  Other:    Medical Decision Making  Medically screening exam initiated at 5:19 PM.  Appropriate orders placed.  Tyler White was informed that the remainder of the evaluation will be completed by another provider, this initial triage assessment does not replace that evaluation, and the importance of remaining in the ED until their evaluation is complete.  Will obtain medical screening labs and consult TTS. As patient presents after suicide attempt, he is at increased risk for further attempts and as such, is a harm to himself. Will obtain IVC   Ta Fair T, PA-C 01/28/22 2134

## 2022-01-28 NOTE — ED Triage Notes (Signed)
Patient presents requesting a mental health evaluation. States he has been hearing voices over the past week. The voices tell him to hurt himself and others. States he has not picked up any of his prescriptions. Patient very quiet and hard to hear when speaking.

## 2022-01-28 NOTE — ED Provider Notes (Signed)
I independently interpreted patient's labs and he has a normal CMP, CBC and EtOH level.  He is positive for cocaine and admits to using cocaine today.  He is endorsing active suicidal ideation and auditory hallucinations which tell him to hurt himself.  He was discharged from mental health hospital approximately 1 week ago and reports he was taking Haldol and Zyprexa at that time which did seem to help but then he left the prescription on the bus and has not had any of the medications in the last week.  He currently is calm and cooperative.  TTS to evaluate.   Blanchie Dessert, MD 01/28/22 2053

## 2022-01-29 ENCOUNTER — Other Ambulatory Visit: Payer: Self-pay

## 2022-01-29 ENCOUNTER — Other Ambulatory Visit: Payer: Self-pay | Admitting: Registered Nurse

## 2022-01-29 ENCOUNTER — Inpatient Hospital Stay (HOSPITAL_COMMUNITY)
Admission: AD | Admit: 2022-01-29 | Discharge: 2022-02-08 | DRG: 885 | Disposition: A | Discharge status: Home / Self Care | Payer: Medicaid Other | Source: Intra-hospital | Attending: Psychiatry | Admitting: Psychiatry

## 2022-01-29 ENCOUNTER — Encounter (HOSPITAL_COMMUNITY): Payer: Self-pay | Admitting: Registered Nurse

## 2022-01-29 ENCOUNTER — Ambulatory Visit (HOSPITAL_COMMUNITY)
Admission: EM | Admit: 2022-01-29 | Discharge: 2022-01-29 | Disposition: A | Discharge status: Other Acute Inpatient Hospital | Payer: Medicaid Other | Source: Home / Self Care

## 2022-01-29 DIAGNOSIS — Z20822 Contact with and (suspected) exposure to covid-19: Secondary | ICD-10-CM | POA: Diagnosis present

## 2022-01-29 DIAGNOSIS — Z79899 Other long term (current) drug therapy: Secondary | ICD-10-CM | POA: Insufficient documentation

## 2022-01-29 DIAGNOSIS — Z638 Other specified problems related to primary support group: Secondary | ICD-10-CM

## 2022-01-29 DIAGNOSIS — F1290 Cannabis use, unspecified, uncomplicated: Secondary | ICD-10-CM

## 2022-01-29 DIAGNOSIS — F1414 Cocaine abuse with cocaine-induced mood disorder: Secondary | ICD-10-CM | POA: Insufficient documentation

## 2022-01-29 DIAGNOSIS — F17210 Nicotine dependence, cigarettes, uncomplicated: Secondary | ICD-10-CM | POA: Diagnosis present

## 2022-01-29 DIAGNOSIS — Z9114 Patient's other noncompliance with medication regimen: Secondary | ICD-10-CM

## 2022-01-29 DIAGNOSIS — F259 Schizoaffective disorder, unspecified: Secondary | ICD-10-CM | POA: Diagnosis present

## 2022-01-29 DIAGNOSIS — Z5900 Homelessness unspecified: Secondary | ICD-10-CM | POA: Diagnosis not present

## 2022-01-29 DIAGNOSIS — F1510 Other stimulant abuse, uncomplicated: Secondary | ICD-10-CM | POA: Diagnosis not present

## 2022-01-29 DIAGNOSIS — Z9151 Personal history of suicidal behavior: Secondary | ICD-10-CM | POA: Insufficient documentation

## 2022-01-29 DIAGNOSIS — Z818 Family history of other mental and behavioral disorders: Secondary | ICD-10-CM | POA: Diagnosis not present

## 2022-01-29 DIAGNOSIS — F1590 Other stimulant use, unspecified, uncomplicated: Secondary | ICD-10-CM

## 2022-01-29 DIAGNOSIS — F419 Anxiety disorder, unspecified: Secondary | ICD-10-CM | POA: Diagnosis present

## 2022-01-29 DIAGNOSIS — F250 Schizoaffective disorder, bipolar type: Principal | ICD-10-CM | POA: Diagnosis present

## 2022-01-29 DIAGNOSIS — Z8249 Family history of ischemic heart disease and other diseases of the circulatory system: Secondary | ICD-10-CM

## 2022-01-29 DIAGNOSIS — Z9152 Personal history of nonsuicidal self-harm: Secondary | ICD-10-CM | POA: Insufficient documentation

## 2022-01-29 DIAGNOSIS — F1410 Cocaine abuse, uncomplicated: Secondary | ICD-10-CM

## 2022-01-29 LAB — HEMOGLOBIN A1C
Hgb A1c MFr Bld: 5.2 % (ref 4.8–5.6)
Mean Plasma Glucose: 102.54 mg/dL

## 2022-01-29 MED ORDER — OLANZAPINE 10 MG PO TBDP: 10.0000 mg | ORAL_TABLET | Freq: Three times a day (TID) | ORAL | Status: DC | PRN

## 2022-01-29 MED ORDER — HYDROXYZINE HCL 25 MG PO TABS: 25.0000 mg | ORAL_TABLET | Freq: Three times a day (TID) | ORAL | Status: DC | PRN

## 2022-01-29 MED ORDER — HYDROXYZINE HCL 25 MG PO TABS: 25.0000 mg | ORAL_TABLET | Freq: Three times a day (TID) | ORAL | 0 refills | Status: DC | PRN

## 2022-01-29 MED ORDER — BENZTROPINE MESYLATE 0.5 MG PO TABS
0.5000 mg | ORAL_TABLET | Freq: Two times a day (BID) | ORAL | Status: DC
  Administered 2022-01-29 – 2022-02-08 (×20): 0.5 mg via ORAL
  Filled 2022-01-29 (×24): qty 1

## 2022-01-29 MED ORDER — RISPERIDONE 1 MG PO TABS
1.0000 mg | ORAL_TABLET | Freq: Two times a day (BID) | ORAL | Status: DC
  Administered 2022-01-29 – 2022-01-31 (×4): 1 mg via ORAL
  Filled 2022-01-29 (×9): qty 1

## 2022-01-29 MED ORDER — PROPRANOLOL HCL 10 MG PO TABS
10.0000 mg | ORAL_TABLET | Freq: Two times a day (BID) | ORAL | Status: DC
  Administered 2022-01-29 – 2022-02-04 (×11): 10 mg via ORAL
  Filled 2022-01-29 (×17): qty 1

## 2022-01-29 MED ORDER — TRAZODONE HCL 50 MG PO TABS: 50.0000 mg | ORAL_TABLET | Freq: Every evening | ORAL | Status: DC | PRN

## 2022-01-29 MED ORDER — ZIPRASIDONE MESYLATE 20 MG IM SOLR: 20.0000 mg | INTRAMUSCULAR | Status: DC | PRN

## 2022-01-29 MED ORDER — PROPRANOLOL HCL 10 MG PO TABS
10.0000 mg | ORAL_TABLET | Freq: Two times a day (BID) | ORAL | Status: DC
  Administered 2022-01-29: 10 mg via ORAL
  Filled 2022-01-29: qty 1

## 2022-01-29 MED ORDER — MAGNESIUM HYDROXIDE 400 MG/5ML PO SUSP: 30.0000 mL | Freq: Every day | ORAL | Status: DC | PRN

## 2022-01-29 MED ORDER — BENZTROPINE MESYLATE 0.5 MG PO TABS
0.5000 mg | ORAL_TABLET | Freq: Two times a day (BID) | ORAL | Status: DC
  Administered 2022-01-29: 0.5 mg via ORAL
  Filled 2022-01-29: qty 1

## 2022-01-29 MED ORDER — LORAZEPAM 1 MG PO TABS: 1.0000 mg | ORAL_TABLET | ORAL | Status: DC | PRN

## 2022-01-29 MED ORDER — ALUM & MAG HYDROXIDE-SIMETH 200-200-20 MG/5ML PO SUSP: 30.0000 mL | ORAL | Status: DC | PRN

## 2022-01-29 MED ORDER — ARIPIPRAZOLE 5 MG PO TABS
5.0000 mg | ORAL_TABLET | Freq: Every day | ORAL | Status: DC
  Administered 2022-01-29: 5 mg via ORAL
  Filled 2022-01-29: qty 1

## 2022-01-29 MED ORDER — RISPERIDONE 1 MG PO TABS: 1.0000 mg | ORAL_TABLET | Freq: Two times a day (BID) | ORAL | Status: DC

## 2022-01-29 MED ORDER — ACETAMINOPHEN 325 MG PO TABS: 650.0000 mg | ORAL_TABLET | Freq: Four times a day (QID) | ORAL | Status: DC | PRN

## 2022-01-29 MED ORDER — ARIPIPRAZOLE 5 MG PO TABS
5.0000 mg | ORAL_TABLET | Freq: Every day | ORAL | Status: DC
  Administered 2022-01-30 – 2022-02-02 (×4): 5 mg via ORAL
  Filled 2022-01-29 (×5): qty 1

## 2022-01-29 MED ORDER — RISPERIDONE 1 MG PO TABS
1.0000 mg | ORAL_TABLET | Freq: Two times a day (BID) | ORAL | Status: DC
  Administered 2022-01-29: 1 mg via ORAL
  Filled 2022-01-29: qty 1

## 2022-01-29 NOTE — Progress Notes (Signed)
Patient ID: Tyler White, male   DOB: January 12, 1996, 26 y.o.   MRN: [id number]   Initial Treatment Plan 01/29/2022 6:51 PM Benjaman Artman BBC:[id number]    PATIENT STRESSORS: Financial difficulties   Medication change or noncompliance     PATIENT STRENGTHS: Average or above average intelligence  Motivation for treatment/growth    PATIENT IDENTIFIED PROBLEMS: Psychosis  Substance Use  "Depression"                 DISCHARGE CRITERIA:  Ability to meet basic life and health needs Improved stabilization in mood, thinking, and/or behavior Safe-care adequate arrangements made Verbal commitment to aftercare and medication compliance Withdrawal symptoms are absent or subacute and managed without 24-hour nursing intervention  PRELIMINARY DISCHARGE PLAN: Outpatient therapy Placement in alternative living arrangements  PATIENT/FAMILY INVOLVEMENT: This treatment plan has been presented to and reviewed with the patient, Tyler White, and/or family member.  The patient and family have been given the opportunity to ask questions and make suggestions.  Harriet Masson, RN 01/29/2022, 6:51 PM

## 2022-01-29 NOTE — BH Assessment (Signed)
Comprehensive Clinical Assessment (CCA) Note  01/29/2022 Tyler White [id number]   Disposition:  Tyler Romp, NP recommended pt be transferred to [facility] for continuous assessment. Tyler White [facility] AC and Tyler White, [place] notified.  The patient demonstrates the following risk factors for suicide: Chronic risk factors for suicide include: psychiatric disorder of schizoaffective disorder, substance use disorder, previous suicide attempts via OD, and previous self-harm via cutting . Acute risk factors for suicide include: unemployment. Protective factors for this patient include:  pt refused . Considering these factors, the overall suicide risk at this point appears to be high. Patient is not appropriate for outpatient follow up.   [name] ED from 01/28/2022 in [name] Most recent reading at 01/29/2022  1:15 AM Admission (Discharged) from 12/12/2021 in [name] Most recent reading at 12/12/2021  8:30 PM ED from 12/12/2021 in [name] Most recent reading at 12/12/2021  4:53 AM  C-SSRS RISK CATEGORY High Risk High Risk High Risk        Pt is a 26 year old male reporting to the ED due to per ED notes: Requesting a mental health evaluation. States he has been hearing voices over the past week. The voices tell him to hurt himself and others.  Pt reports a hx of Schizoaffective DO and reports he has been non medication compliant for over a week. Pt appeared to be responding to internal stimuli and had to be redirected at times during consult. When asked if pt was suicidal he reports " I think I took some pills at 2pm". Pt reports  a hx of suicide attempts, but would not go into detail. Pt reports a hx of self harm via cutting himself with  a knife a few days ago. Pt denied HI, per ed notes he reports hearing voices telling him to hurt others. Pt denied legal issues and access to weapons.  Pt denied seeing and outpatient therapist/ psychiatrist.   Disposition with Tyler Romp, NP ,recommended pt be transferred to [name] for continuous assessment. Tyler White [facility] AC and Tyler White, [place] notified.  Chief Complaint:  Chief Complaint  Patient presents with   Hallucinations    Auditory    Visit Diagnosis: Schizoaffective Disorder, Bipolar Typ[e    CCA Screening, Triage and Referral (STR)  Patient Reported Information How did you hear about [place]? Self  What Is the Reason for Your Visit/Call Today? Pt reports to the ED due to hearing voices to hurt himself and other. Pt reports also seeing things.  How Long Has This Been Causing You Problems? <Week  What Do You Feel Would Help You the Most Today? Treatment for Depression or other mood problem   Have You Recently Had Any Thoughts About Hurting Yourself? Yes  Are You Planning to Commit Suicide/Harm Yourself At This time? No   Have you Recently Had Thoughts About [name]? Yes  Are You Planning to Harm Someone at This Time? No  Explanation: No data recorded  Have You Used Any Alcohol or Drugs in the Past 24 Hours? No  How Long Ago Did You Use Drugs or Alcohol? No data recorded What Did You Use and How Much? Pt reports, using $30.00 worth of Crack Cocaine.   Do You Currently Have a Therapist/Psychiatrist? No  Name of Therapist/Psychiatrist: [name]   Have You Been Recently Discharged From Any Office Practice or Programs? No  Explanation of Discharge From Practice/Program: No data recorded  CCA Screening Triage Referral Assessment Type of Contact: Tele-Assessment  Telemedicine Service Delivery:   Is this Initial or Reassessment? Initial Assessment  Date Telepsych consult ordered in [facility]:  01/28/22  Time Telepsych consult ordered in [facility]:  1718  Location of Assessment: [facility] ED  Provider Location: [facility] Assessment Services   Collateral Involvement: nne   Does Patient Have a [place]? No data recorded Name and Contact of Legal Guardian: No data recorded If Minor and Not Living with Parent(s), Who has Custody? n/a  Is CPS involved or ever been involved? Never  Is APS involved or ever been involved? Never   Patient Determined To Be At Risk for Harm To Self or Others Based on Review of Patient Reported Information or Presenting Complaint? Yes, for Self-Harm  Method: No data recorded Availability of Means: No data recorded Intent: No data recorded Notification Required: No data recorded Additional Information for Danger to Others Potential: No data recorded Additional Comments for Danger to Others Potential: No data recorded Are There Guns or Other Weapons in Your Home? No data recorded Types of Guns/Weapons: No data recorded Are These Weapons Safely Secured?                            No data recorded Who Could Verify You Are Able To Have These Secured: No data recorded Do You Have any Outstanding Charges, Pending Court Dates, Parole/Probation? No data recorded Contacted To Inform of Risk of Harm To Self or Others: Other: Comment (pt is in the ED)    Does Patient Present under Involuntary Commitment? No  IVC Papers Initial File Date: No data recorded  [place] of Residence: [place]   Patient Currently Receiving the Following Services: -- (pt denied)   Determination of Need: Urgent (48 hours)   Options For Referral: Medication Management; ED Visit; Inpatient Hospitalization     CCA Biopsychosocial Patient Reported Schizophrenia/Schizoaffective Diagnosis in Past: Yes   Strengths: pt denied   Mental Health Symptoms Depression:   Sleep (too much or little)   Duration of Depressive symptoms:  Duration of Depressive Symptoms: Less than two weeks   Mania:   None   Anxiety:    Sleep   Psychosis:   Hallucinations   Duration of Psychotic symptoms:  Duration of Psychotic Symptoms: Less than six months   Trauma:   None    Obsessions:   None   Compulsions:   None   Inattention:   None   Hyperactivity/Impulsivity:   None   Oppositional/Defiant Behaviors:   None   Emotional Irregularity:   None   Other Mood/Personality Symptoms:   Pt was flat and distracted during TTS consult.    Mental Status Exam Appearance and self-care  Stature:   Average   Weight:   Average weight   Clothing:   -- (pt was in hospital scrubs.)   Grooming:   Normal   Cosmetic use:   None   Posture/gait:   Rigid   Motor activity:   Restless   Sensorium  Attention:   Confused   Concentration:   Scattered   Orientation:   Object; Person; Place; Situation   Recall/memory:   Normal   Affect and Mood  Affect:   Flat   Mood:   Irritable   Relating  Eye contact:   Fleeting   Facial expression:   Constricted   Attitude toward examiner:   Cooperative   Thought and Language  Speech flow:  Soft; Slow; Slurred   Thought content:   Ideas of Reference   Preoccupation:   None   Hallucinations:   Auditory; Visual (pt reports hearing mumbling and seeing a dark figure)   Organization:  No data recorded  [employer] of Knowledge:   Poor   Intelligence:   Needs investigation   Abstraction:   Functional   Judgement:   Poor   Reality Testing:   Distorted   Insight:   Poor   Decision Making:   Confused   Social Functioning  Social Maturity:   -- (unable to assess.)   Social Judgement:   -- (unable to assess.)   Stress  Stressors:   -- (pt denied)   Coping Ability:   -- (unable to assess.)   Skill Deficits:   -- (unable to asses)   Supports:   -- (pt denied supports)     Religion: Religion/Spirituality Are You A Religious Person?: No How Might This Affect Treatment?: n/a  Leisure/Recreation: Leisure / Recreation Do You Have Hobbies?: No  Exercise/Diet: Exercise/Diet Do You Exercise?: No Have You Gained or Lost A Significant Amount of  Weight in the Past Six Months?: No Do You Follow a Special Diet?: No Do You Have Any Trouble Sleeping?: Yes Explanation of Sleeping Difficulties: pt report difficulty in sleeping due to not having anywhere to sleep   CCA Employment/Education Employment/Work Situation: Employment / Work Situation Employment Situation: Unemployed Patient's Job has Been Impacted by Current Illness: No Has Patient ever Been in Passenger transport manager?: No  Education: Education Is Patient Currently Attending School?: No Did Physicist, medical?: No Did You Have An Individualized Education Program (IIEP): No Did You Have Any Difficulty At [employer]?: No Patient's Education Has Been Impacted by Current Illness: No   CCA Family/Childhood History Family and Relationship History: Family history Marital status: Single Does patient have children?: No How is patient's relationship with their children?: n/a  Childhood History:  Childhood History By whom was/is the patient raised?:  (unable to assess) Did patient suffer any verbal/emotional/physical/sexual abuse as a child?: No Did patient suffer from severe childhood neglect?: No Has patient ever been sexually abused/assaulted/raped as an adolescent or adult?: No Was the patient ever a victim of a crime or a disaster?: No Witnessed domestic violence?: No Has patient been affected by domestic violence as an adult?: No  Child/Adolescent Assessment:     CCA Substance Use Alcohol/Drug Use: Alcohol / Drug Use Pain Medications: none Prescriptions: unable to assess, pt reports being non med compliant Over the Counter: n/a History of alcohol / drug use?: Yes Longest period of sobriety (when/how long): unknown Negative Consequences of Use: Financial Withdrawal Symptoms: None Substance #1 Name of Substance 1: Cocaine 1 - Age of First Use: unknown 1 - Amount (size/oz): unknown 1 - Frequency: unknown 1 - Duration: unknown 1 - Last Use / Amount: unknown 1 - Method  of Aquiring: unknown 1- Route of Use: unknown Substance #2 Name of Substance 2: Marijuana 2 - Age of First Use: unknown 2 - Amount (size/oz): unknown 2 - Frequency: unknown 2 - Duration: unknown 2 - Last Use / Amount: unknown 2 - Method of Aquiring: unknown 2 - Route of Substance Use: unknown                     ASAM's:  Six Dimensions of Multidimensional Assessment  Dimension 1:  Acute Intoxication and/or Withdrawal Potential:      Dimension 2:  Biomedical Conditions and Complications:      Dimension 3:  Emotional, Behavioral, or Cognitive Conditions and Complications:     Dimension 4:  Readiness to Change:     Dimension 5:  Relapse, Continued use, or Continued Problem Potential:     Dimension 6:  Recovery/Living Environment:     ASAM Severity Score:    ASAM Recommended Level of Treatment: ASAM Recommended Level of Treatment:  (unable to assess)   Substance use Disorder (SUD) Substance Use Disorder (SUD)  Checklist Symptoms of Substance Use:  (unable to assess)  Recommendations for Services/Supports/Treatments: Recommendations for Services/Supports/Treatments Recommendations For Services/Supports/Treatments: CD-IOP Intensive Chemical Dependency Program (unable to assess)  Discharge Disposition: Discharge Disposition Medical Exam completed: Yes Disposition of Patient:  (Per Tyler Romp NP, Pt to be transported to [facility] for continous assessment.) Mode of transportation if patient is discharged/movement?: Other (comment) (Pt will arrive to [facility] via [facility])  DSM5 Diagnoses: Patient Active Problem List   Diagnosis Date Noted   Substance induced mood disorder ([place]) 12/12/2021   Xerosis of skin 10/09/2021   Schizoaffective disorder ([place]) 10/08/2021   Schizoaffective disorder, bipolar type ([place]) 04/26/2021   Marijuana abuse in remission 04/26/2021   Polysubstance abuse ([place]) 04/23/2021   Homelessness 04/23/2021   Tobacco abuse 01/15/2021   Schizophrenia  ([place]) 11/19/2020   Cannabis abuse 04/28/2020   Cocaine abuse ([place]) 04/28/2020   Cocaine abuse, continuous use ([place]) 02/19/2018   Cocaine abuse with cocaine-induced mood disorder ([place]) 02/19/2018   Psychosis ([place]) 11/27/2017   Cannabis abuse with psychotic disorder, with delusions ([place]) 11/26/2017   Schizophrenia, unspecified ([place]) 11/26/2017   Right inguinal hernia 04/06/2013     Referrals to Alternative Service(s): Referred to Alternative Service(s):   Place:   Date:   Time:    Referred to Alternative Service(s):   Place:   Date:   Time:    Referred to Alternative Service(s):   Place:   Date:   Time:    Referred to Alternative Service(s):   Place:   Date:   Time:     Welton Bord M. Kharee Lesesne, Deon Pilling

## 2022-01-29 NOTE — ED Notes (Signed)
Pt was given some animal crackers and grape juice

## 2022-01-29 NOTE — ED Notes (Signed)
Pt awake & resting at present.  No distress noted.  Respirations even & unlabored.  Monitoring for safety.

## 2022-01-29 NOTE — Progress Notes (Signed)
[facility] LCSW Progress Note   01/29/2022    1:40 PM  Tyler White   [id number]   Type of Contact and Topic:  Psychiatric Bed Placement   Pt accepted to [facility] 507-1    Patient meets inpatient criteria per [name], NP   The attending provider will be Janine Limbo, MD  Call report to [phone]    Drema Halon, RN @ [facility] notified.     Pt scheduled  to arrive at [place] @ 1400.   Mariea Clonts, MSW, LCSW-A  1:41 PM 01/29/2022

## 2022-01-29 NOTE — ED Notes (Signed)
All of pt's belongings given to [employer].  He was escorted off unit with staff. No distress noted.

## 2022-01-29 NOTE — ED Notes (Signed)
Report called to [place], Therapist, sports at [facility]

## 2022-01-29 NOTE — ED Notes (Signed)
Pt brought to room 41 for TTS assessment

## 2022-01-29 NOTE — ED Notes (Signed)
Pt given meal tray. Sitting up eating. Given juice to drink no distress noted.

## 2022-01-29 NOTE — ED Notes (Signed)
Pt awake and alert. Sitting up in  bed. Flat affect  Pt declined cereal  Denies any thoughts of SI this morning along with denying HI or AVH.   Will continue to monitor and await dispo.

## 2022-01-29 NOTE — ED Provider Notes (Signed)
Behavioral Health Admission H&P [facility] & OBS)  Date: 01/29/22 Patient Name: Tyler White MRN: [id number] Chief Complaint:  Chief Complaint  Patient presents with   IVC    Suicidal, Hallucinations      Diagnoses:  Final diagnoses:  Schizoaffective disorder, bipolar type ([place])  Cocaine use disorder ([place])  Cannabis use disorder    HPI: Tyler White is a 26 year old male with past psychiatric history significant for schizoaffective disorder bipolar type, cocaine abuse, cocaine induced mood disorder, cannabis abuse, and akathisia as well as no apparent documented or reported significant past medical history who presents to the [facility] behavioral health urgent care [facility]) as an involuntary continuous assessment direct admit transfer from [facility] emergency department under IVC.  Patient originally presented to [facility] emergency department voluntarily yesterday on 01/28/2022 reporting that he had attempted suicide earlier on 01/28/2022 (of note, per TTS, it was reported to TTS counselor by the patient during TTS evaluation that patient was not sure if he overdosed earlier on 01/28/2022 or not).  Patient was then placed under IVC by EDP (Dr. Gilford Raid) and first exam was completed on 01/28/2022 at 5:50 PM EDP as well.  Affidavit and petition for IVC states as follows:  "Patient reports suicide attempt earlier today by attempted overdose.  He is having visual hallucinations of "seeing messages" and hearing voices that are telling him to harm himself and others."  Please see IVC paperwork for further details if necessary.  Patient was ultimately medically cleared in the emergency department at that time for psychiatric evaluation and patient was then evaluated by TTS earlier this morning on 01/29/2022, in which upon TTS evaluation, patient was recommended for transfer to [facility] by Lindon Romp, NP.   Per TTS evaluation (as documented in [place], [place] 01/29/22 note):   "Pt is a 26  year old male reporting to the ED due to per ED notes: Requesting a mental health evaluation. States he has been hearing voices over the past week. The voices tell him to hurt himself and others.  Pt reports a hx of Schizoaffective DO and reports he has been non medication compliant for over a week. Pt appeared to be responding to internal stimuli and had to be redirected at times during consult. When asked if pt was suicidal he reports " I think I took some pills at 2pm". Pt reports  a hx of suicide attempts, but would not go into detail. Pt reports a hx of self harm via cutting himself with  a knife a few days ago. Pt denied HI, per ed notes he reports hearing voices telling him to hurt others. Pt denied legal issues and access to weapons. Pt denied seeing and outpatient therapist/ psychiatrist."    Patient's seen and examined face-to-face by this provider upon his arrival to [facility] from the emergency department. On the contrary to patient reportedly stating to the ED staff on 01/28/2022 that he attempted suicide via overdose on 01/28/2022, patient states to this provider that he has not attempted suicide since October 2022, in which he states that he attempted suicide at that time by placing a blanket over his head and trying to choke himself at that time.  Patient denies SI currently on exam.  He denies having any SI over the past few weeks.  Patient endorses past history of self-injurious behavior via intentionally cutting himself, but he denies any cutting behaviors for greater than 6 months. Patient reports that he went to the emergency department on 01/28/2022 because  he was "hearing strange things and seeing dark figures".  Patient denies AVH currently on exam, but he does endorse experiencing AVH "all the time" for the past 2 years.  He reports that his AVH has gotten worse over the past week, but patient is unable to provide further details about how they have worsened over the past week when asked to do so.   Patient describes his auditory hallucinations as hearing "scratches", "rats running across the floor", and the voices of multiple males and females, in which patient states that he does not know who these voices belong to.  Patient also states that he is not sure what these male and male voices say to him.  He reports that he has been experiencing these auditory hallucinations intermittently on a daily basis and he states that he last experienced them last night on 01/28/2022.  Patient describes his visual hallucinations as seeing dark figures and rats running across the room.  He states that he has been experiencing these visual hallucinations intermittently on a daily basis and that he last experienced them last night on 01/28/2022 as well.  Patient denies HI.  He endorses feelings of paranoia directed towards no one in particular. Patient describes her sleep as poor, about 5 hours per night.  He denies anhedonia, feelings of guilt/hopelessness/worthlessness, energy changes, concentration changes, or appetite changes.  Patient does endorse a 10 pound weight loss over the past few months.  Patient denies having an outpatient psychiatrist or therapist at this time.  Patient reports that he is not currently taking any psychotropic medications or any other medications at this time.  Per chart review, patient was recently psychiatrically hospitalized at [facility]) from 12/12/2021 to 12/16/2021 and was discharged on Abilify 5 mg p.o. daily Cogentin 0.5 mg twice daily, propranolol 10 mg p.o. twice daily, and Risperdal 2 mg p.o. twice daily.  Patient reports that he has not taken Risperdal, Cogentin, Abilify, or propranolol since this December 2022 [facility] admission noted above.  Patient also reports that he was psychiatrically hospitalized at a facility in [place] about 2 weeks ago (patient unable to recall the name of this facility at this time and I am unable to verify this hospitalization  per my chart review) and patient states that during this hospitalization, he was discharged on Haldol, Benadryl, and Zyprexa, but patient states that he has not taken these medications for 2 weeks because he accidentally left these medications on a [employer] bus 2 weeks ago after discharge from that hospitalization.  Per chart review, the most recent prescriptions I am able to view in patient's chart are patient's Cogentin, propranolol, Abilify, and Risperdal prescriptions noted above from December 2022. Per chart review, patient was recently evaluated at [place] ED on 01/09/2022 and at [facility] ED on 12/31/2021 for psychiatric complaints and was psych cleared on both of those occasions.  Patient reports that he is homeless and has been homeless for the past 2 years.  He denies access to firearms.  He denies alcohol or tobacco/nicotine use.  Patient endorses smoking "a few pieces" of crack cocaine daily.  He reports that his last crack cocaine use was yesterday on 01/28/2022.  Patient also endorses smoking 1 blunt of Marijuana daily. He reports that his last marijuana use was 1 blunt yesterday on 01/28/2022. He denies any additional substance use at this time.  Patient reports that he is currently unemployed at this time.  On exam, patient is sitting  upright with casual appearance in no acute distress.  Eye contact is fair and fleeting.  Speech is clear and coherent with normal rate and decreased volume.  Mood is depressed with congruent affect.  Thought process is coherent and goal directed.  Patient is alert and oriented x4, cooperative, and answers all questions asked during the evaluation.  No indication the patient is responding to internal/external stimuli.  No delusional thought content noted.  PHQ 2-9:  [place] ED from 03/11/2021 in [place]  Thoughts that you would be better off dead, or of hurting yourself in some way Several  days  PHQ-9 Total Score 17       [place] ED from 01/29/2022 in [facility] ED from 01/28/2022 in [place] Admission (Discharged) from 12/12/2021 in [place] [facility]  C-SSRS RISK CATEGORY High Risk High Risk High Risk        Total Time spent with patient: 30 minutes  Musculoskeletal  Strength & Muscle Tone: within normal limits Gait & Station: normal Patient leans: N/A  Psychiatric Specialty Exam  Presentation General Appearance: Appropriate for Environment; Casual  Eye Contact:Fair; Fleeting  Speech:Clear and Coherent; Normal Rate  Speech Volume:Decreased  Handedness:Right   Mood and Affect  Mood:Depressed  Affect:Congruent   Thought Process  Thought Processes:Coherent; Goal Directed  Descriptions of Associations:Intact  Orientation:Full (Time, Place and Person)  Thought Content:Paranoid Ideation  Diagnosis of Schizophrenia or Schizoaffective disorder in past: Yes  Duration of Psychotic Symptoms: Greater than six months  Hallucinations:Hallucinations: -- (Patient denies AVH currently on exam, but endorses history of worsening AVH (see HPI for details).)  Ideas of Reference:Paranoia  Suicidal Thoughts:Suicidal Thoughts: No  Homicidal Thoughts:Homicidal Thoughts: No   Sensorium  Memory:Immediate Fair; Recent Fair; Remote Fair  Judgment:Fair  Insight:Fair   Executive Functions  Concentration:Fair  Attention Span:Fair  [place]   Psychomotor Activity  Psychomotor Activity:Psychomotor Activity: Normal   Assets  Assets:Communication Skills; Desire for Improvement; Financial Resources/Insurance; Leisure Time; Physical Health; Resilience   Sleep  Sleep:Sleep: Poor Number of Hours of Sleep: 5   Nutritional Assessment (For OBS and FBC admissions only) Has the patient had a weight loss or gain  of 10 pounds or more in the last 3 months?: Yes Has the patient had a decrease in food intake/or appetite?: No Does the patient have dental problems?: No Does the patient have eating habits or behaviors that may be indicators of an eating disorder including binging or inducing vomiting?: No Has the patient recently lost weight without trying?: 1 Has the patient been eating poorly because of a decreased appetite?: 0 Malnutrition Screening Tool Score: 1    Physical Exam Vitals reviewed.  Constitutional:      General: He is not in acute distress.    Appearance: He is not ill-appearing, toxic-appearing or diaphoretic.  HENT:     Head: Normocephalic and atraumatic.     Right Ear: External ear normal.     Left Ear: External ear normal.     Nose: Nose normal.  Eyes:     General:        Right eye: No discharge.        Left eye: No discharge.     Conjunctiva/sclera: Conjunctivae normal.  Cardiovascular:     Rate and Rhythm: Normal rate.  Pulmonary:     Effort: Pulmonary effort is normal. No respiratory distress.  Musculoskeletal:  General: Normal range of motion.     Cervical back: Normal range of motion.  Neurological:     General: No focal deficit present.     Mental Status: He is alert and oriented to person, place, and time.     Comments: No tremor noted.   Psychiatric:        Attention and Perception: He perceives auditory and visual hallucinations.        Mood and Affect: Mood is depressed.        Speech: Speech normal.        Behavior: Behavior is not agitated, slowed, aggressive, withdrawn, hyperactive or combative. Behavior is cooperative.        Thought Content: Thought content is paranoid. Thought content is not delusional. Thought content does not include homicidal or suicidal ideation.     Comments: Affect mood-congruent.    Review of Systems  Constitutional:  Positive for weight loss. Negative for chills, diaphoresis, fever and malaise/fatigue.  HENT:   Negative for congestion.   Respiratory:  Negative for cough and shortness of breath.   Cardiovascular:  Negative for chest pain and palpitations.  Gastrointestinal:  Negative for abdominal pain, constipation, diarrhea, nausea and vomiting.  Musculoskeletal:  Negative for joint pain and myalgias.  Neurological:  Negative for dizziness and headaches.  Psychiatric/Behavioral:  Positive for depression, hallucinations and substance abuse. Negative for memory loss and suicidal ideas. The patient has insomnia.   All other systems reviewed and are negative.  Vitals: Blood pressure 116/70, pulse 62, temperature 98.2 F (36.8 C), temperature source Oral, resp. rate 16, SpO2 100 %. There is no height or weight on file to calculate BMI.  Past Psychiatric History: past psychiatric history significant for schizoaffective disorder bipolar type, cocaine abuse, cocaine induced mood disorder, cannabis abuse, akathisia.  See HPI for further details regarding patient's past psychiatric history if necessary.  Is the patient at risk to self? Yes  Has the patient been a risk to self in the past 6 months? Yes .    Has the patient been a risk to self within the distant past? Yes   Is the patient a risk to others? No   Has the patient been a risk to others in the past 6 months? No   Has the patient been a risk to others within the distant past? No   Past Medical History:  Past Medical History:  Diagnosis Date   Hernia, inguinal, right    Inguinal hernia    right   Psychiatric illness    Schizophrenia ([place])    No past surgical history on file.  Family History:  Family History  Problem Relation Age of Onset   Psychiatric Illness Mother    Hypertension Sister     Social History:  Social History   Socioeconomic History   Marital status: Single    Spouse name: Not on file   Number of children: Not on file   Years of education: 10   Highest education level: Not on file  Occupational History   Not on  file  Tobacco Use   Smoking status: Every Day    Packs/day: 0.50    Years: 5.00    Pack years: 2.50    Types: Cigarettes   Smokeless tobacco: Never  Vaping Use   Vaping Use: Never used  Substance and Sexual Activity   Alcohol use: Not Currently   Drug use: Not Currently    Types: Marijuana, Cocaine    Comment: denies currently  Sexual activity: Yes    Birth control/protection: None  Other Topics Concern   Not on file  Social History Narrative   ** Merged History Encounter **       ** Merged History Encounter **       Social Determinants of Health   Financial Resource Strain: Not on file  Food Insecurity: Not on file  Transportation Needs: Not on file  Physical Activity: Not on file  Stress: Not on file  Social Connections: Not on file  Intimate Partner Violence: Not on file    SDOH:  SDOH Screenings   Alcohol Screen: Low Risk    Last Alcohol Screening Score (AUDIT): 0  Depression (PHQ2-9): Medium Risk   PHQ-2 Score: 17  Financial Resource Strain: Not on file  Food Insecurity: Not on file  Housing: Not on file  Physical Activity: Not on file  Social Connections: Not on file  Stress: Not on file  Tobacco Use: High Risk   Smoking Tobacco Use: Every Day   Smokeless Tobacco Use: Never   Passive Exposure: Not on file  Transportation Needs: Not on file    Last Labs:  Admission on 01/28/2022, Discharged on 01/29/2022  Component Date Value Ref Range Status   SARS Coronavirus 2 by RT PCR 01/28/2022 NEGATIVE  NEGATIVE Final   Comment: (NOTE) SARS-CoV-2 target nucleic acids are NOT DETECTED.  The SARS-CoV-2 RNA is generally detectable in upper respiratory specimens during the acute phase of infection. The lowest concentration of SARS-CoV-2 viral copies this assay can detect is 138 copies/mL. A negative result does not preclude SARS-Cov-2 infection and should not be used as the sole basis for treatment or other patient management decisions. A negative result  may occur with  improper specimen collection/handling, submission of specimen other than nasopharyngeal swab, presence of viral mutation(s) within the areas targeted by this assay, and inadequate number of viral copies(<138 copies/mL). A negative result must be combined with clinical observations, patient history, and epidemiological information. The expected result is Negative.  Fact Sheet for Patients:  EntrepreneurPulse.com.au  Fact Sheet for Healthcare Providers:  IncredibleEmployment.be  This test is no                          t yet approved or cleared by the [place] FDA and  has been authorized for detection and/or diagnosis of SARS-CoV-2 by FDA under an Emergency Use Authorization (EUA). This EUA will remain  in effect (meaning this test can be used) for the duration of the COVID-19 declaration under Section 564(b)(1) of the Act, 21 U.S.C.section 360bbb-3(b)(1), unless the authorization is terminated  or revoked sooner.       Influenza A by PCR 01/28/2022 NEGATIVE  NEGATIVE Final   Influenza B by PCR 01/28/2022 NEGATIVE  NEGATIVE Final   Comment: (NOTE) The Xpert Xpress SARS-CoV-2/FLU/RSV plus assay is intended as an aid in the diagnosis of influenza from Nasopharyngeal swab specimens and should not be used as a sole basis for treatment. Nasal washings and aspirates are unacceptable for Xpert Xpress SARS-CoV-2/FLU/RSV testing.  Fact Sheet for Patients: EntrepreneurPulse.com.au  Fact Sheet for Healthcare Providers: IncredibleEmployment.be  This test is not yet approved or cleared by the [place] FDA and has been authorized for detection and/or diagnosis of SARS-CoV-2 by FDA under an Emergency Use Authorization (EUA). This EUA will remain in effect (meaning this test can be used) for the duration of the COVID-19 declaration under Section 564(b)(1) of the Act, 21  U.S.C. section  360bbb-3(b)(1), unless the authorization is terminated or revoked.  Performed at [facility], [place] [address] [place], [place] [address]    Sodium 01/28/2022 138  135 - 145 mmol/L Final   Potassium 01/28/2022 4.1  3.5 - 5.1 mmol/L Final   Chloride 01/28/2022 105  98 - 111 mmol/L Final   CO2 01/28/2022 23  22 - 32 mmol/L Final   Glucose, Bld 01/28/2022 84  70 - 99 mg/dL Final   Glucose reference range applies only to samples taken after fasting for at least 8 hours.   BUN 01/28/2022 22 (H)  6 - 20 mg/dL Final   Creatinine, Ser 01/28/2022 1.17  0.61 - 1.24 mg/dL Final   Calcium 01/28/2022 9.3  8.9 - 10.3 mg/dL Final   Total Protein 01/28/2022 7.1  6.5 - 8.1 g/dL Final   Albumin 01/28/2022 4.2  3.5 - 5.0 g/dL Final   AST 01/28/2022 34  15 - 41 U/L Final   ALT 01/28/2022 27  0 - 44 U/L Final   Alkaline Phosphatase 01/28/2022 54  38 - 126 U/L Final   Total Bilirubin 01/28/2022 0.7  0.3 - 1.2 mg/dL Final   GFR, Estimated 01/28/2022 >60  >60 mL/min Final   Comment: (NOTE) Calculated using the CKD-EPI Creatinine Equation (2021)    Anion gap 01/28/2022 10  5 - 15 Final   Performed at [facility] [address], [place], [place] [address]   Alcohol, Ethyl (B) 01/28/2022 <10  <10 mg/dL Final   Comment: (NOTE) Lowest detectable limit for serum alcohol is 10 mg/dL.  For medical purposes only. Performed at [facility], [place] [address] [place], [place] [address]    Opiates 01/28/2022 NONE DETECTED  NONE DETECTED Final   Cocaine 01/28/2022 POSITIVE (A)  NONE DETECTED Final   Benzodiazepines 01/28/2022 NONE DETECTED  NONE DETECTED Final   Amphetamines 01/28/2022 NONE DETECTED  NONE DETECTED Final   Tetrahydrocannabinol 01/28/2022 NONE DETECTED  NONE DETECTED Final   Barbiturates 01/28/2022 NONE DETECTED  NONE DETECTED Final   Comment: (NOTE) DRUG SCREEN FOR MEDICAL PURPOSES ONLY.  IF CONFIRMATION IS NEEDED FOR ANY PURPOSE, NOTIFY LAB WITHIN 5 DAYS.  LOWEST  DETECTABLE LIMITS FOR URINE DRUG SCREEN Drug Class                     Cutoff (ng/mL) Amphetamine and metabolites    1000 Barbiturate and metabolites    200 Benzodiazepine                 782 Tricyclics and metabolites     300 Opiates and metabolites        300 Cocaine and metabolites        300 THC                            50 Performed at [facility], [place] [address] [place], [place] [address]    WBC 01/28/2022 8.1  4.0 - 10.5 K/uL Final   RBC 01/28/2022 5.17  4.22 - 5.81 MIL/uL Final   Hemoglobin 01/28/2022 14.3  13.0 - 17.0 g/dL Final   HCT 01/28/2022 42.1  39.0 - 52.0 % Final   MCV 01/28/2022 81.4  80.0 - 100.0 fL Final   MCH 01/28/2022 27.7  26.0 - 34.0 pg Final   MCHC 01/28/2022 34.0  30.0 - 36.0 g/dL Final   RDW 01/28/2022 13.7  11.5 -  15.5 % Final   Platelets 01/28/2022 262  150 - 400 K/uL Final   nRBC 01/28/2022 0.0  0.0 - 0.2 % Final   Neutrophils Relative % 01/28/2022 62  % Final   Neutro Abs 01/28/2022 5.0  1.7 - 7.7 K/uL Final   Lymphocytes Relative 01/28/2022 30  % Final   Lymphs Abs 01/28/2022 2.4  0.7 - 4.0 K/uL Final   Monocytes Relative 01/28/2022 6  % Final   Monocytes Absolute 01/28/2022 0.5  0.1 - 1.0 K/uL Final   Eosinophils Relative 01/28/2022 1  % Final   Eosinophils Absolute 01/28/2022 0.1  0.0 - 0.5 K/uL Final   Basophils Relative 01/28/2022 1  % Final   Basophils Absolute 01/28/2022 0.1  0.0 - 0.1 K/uL Final   Immature Granulocytes 01/28/2022 0  % Final   Abs Immature Granulocytes 01/28/2022 0.02  0.00 - 0.07 K/uL Final   Performed at [facility], [place] [address] [place], [place] [address]   Hgb A1c MFr Bld 01/28/2022 5.2  4.8 - 5.6 % Final   Comment: (NOTE) Pre diabetes:          5.7%-6.4%  Diabetes:              >6.4%  Glycemic control for   <7.0% adults with diabetes    Mean Plasma Glucose 01/28/2022 102.54  mg/dL Final   Performed at [facility], [place] [address] [place], [place] [address]  Admission on 12/12/2021,  Discharged on 12/16/2021  Component Date Value Ref Range Status   TSH 12/13/2021 0.802  0.350 - 4.500 uIU/mL Final   Comment: Performed by a 3rd Generation assay with a functional sensitivity of <=0.01 uIU/mL. Performed at [facility], [place] [address], [place], [place] [address]    Cholesterol 12/13/2021 161  0 - 200 mg/dL Final   Triglycerides 12/13/2021 81  <150 mg/dL Final   HDL 12/13/2021 75  >40 mg/dL Final   Total CHOL/HDL Ratio 12/13/2021 2.1  RATIO Final   VLDL 12/13/2021 16  0 - 40 mg/dL Final   LDL Cholesterol 12/13/2021 70  0 - 99 mg/dL Final   Comment:        Total Cholesterol/HDL:CHD Risk Coronary Heart Disease Risk Table                     Men   Women  1/2 Average Risk   3.4   3.3  Average Risk       5.0   4.4  2 X Average Risk   9.6   7.1  3 X Average Risk  23.4   11.0        Use the calculated Patient Ratio above and the CHD Risk Table to determine the patient's CHD Risk.        ATP III CLASSIFICATION (LDL):  <100     mg/dL   Optimal  100-129  mg/dL   Near or Above                    Optimal  130-159  mg/dL   Borderline  160-189  mg/dL   High  >190     mg/dL   Very High Performed at [facility] [address], [place], [place] [address]   Admission on 12/12/2021, Discharged on 12/12/2021  Component Date Value Ref Range Status   SARS Coronavirus 2 by RT PCR 12/12/2021 NEGATIVE  NEGATIVE Final   Comment: (NOTE) SARS-CoV-2 target  nucleic acids are NOT DETECTED.  The SARS-CoV-2 RNA is generally detectable in upper respiratory specimens during the acute phase of infection. The lowest concentration of SARS-CoV-2 viral copies this assay can detect is 138 copies/mL. A negative result does not preclude SARS-Cov-2 infection and should not be used as the sole basis for treatment or other patient management decisions. A negative result may occur with  improper specimen collection/handling, submission of specimen other than  nasopharyngeal swab, presence of viral mutation(s) within the areas targeted by this assay, and inadequate number of viral copies(<138 copies/mL). A negative result must be combined with clinical observations, patient history, and epidemiological information. The expected result is Negative.  Fact Sheet for Patients:  EntrepreneurPulse.com.au  Fact Sheet for Healthcare Providers:  IncredibleEmployment.be  This test is no                          t yet approved or cleared by the [place] FDA and  has been authorized for detection and/or diagnosis of SARS-CoV-2 by FDA under an Emergency Use Authorization (EUA). This EUA will remain  in effect (meaning this test can be used) for the duration of the COVID-19 declaration under Section 564(b)(1) of the Act, 21 U.S.C.section 360bbb-3(b)(1), unless the authorization is terminated  or revoked sooner.       Influenza A by PCR 12/12/2021 NEGATIVE  NEGATIVE Final   Influenza B by PCR 12/12/2021 NEGATIVE  NEGATIVE Final   Comment: (NOTE) The Xpert Xpress SARS-CoV-2/FLU/RSV plus assay is intended as an aid in the diagnosis of influenza from Nasopharyngeal swab specimens and should not be used as a sole basis for treatment. Nasal washings and aspirates are unacceptable for Xpert Xpress SARS-CoV-2/FLU/RSV testing.  Fact Sheet for Patients: EntrepreneurPulse.com.au  Fact Sheet for Healthcare Providers: IncredibleEmployment.be  This test is not yet approved or cleared by the [place] FDA and has been authorized for detection and/or diagnosis of SARS-CoV-2 by FDA under an Emergency Use Authorization (EUA). This EUA will remain in effect (meaning this test can be used) for the duration of the COVID-19 declaration under Section 564(b)(1) of the Act, 21 U.S.C. section 360bbb-3(b)(1), unless the authorization is terminated or revoked.  Performed at [facility], [place] [address] [place], [place] [address]    Sodium 12/12/2021 140  135 - 145 mmol/L Final   Potassium 12/12/2021 3.5  3.5 - 5.1 mmol/L Final   Chloride 12/12/2021 104  98 - 111 mmol/L Final   CO2 12/12/2021 26  22 - 32 mmol/L Final   Glucose, Bld 12/12/2021 111 (H)  70 - 99 mg/dL Final   Glucose reference range applies only to samples taken after fasting for at least 8 hours.   BUN 12/12/2021 16  6 - 20 mg/dL Final   Creatinine, Ser 12/12/2021 1.08  0.61 - 1.24 mg/dL Final   Calcium 12/12/2021 9.4  8.9 - 10.3 mg/dL Final   Total Protein 12/12/2021 6.8  6.5 - 8.1 g/dL Final   Albumin 12/12/2021 4.0  3.5 - 5.0 g/dL Final   AST 12/12/2021 30  15 - 41 U/L Final   ALT 12/12/2021 15  0 - 44 U/L Final   Alkaline Phosphatase 12/12/2021 48  38 - 126 U/L Final   Total Bilirubin 12/12/2021 0.6  0.3 - 1.2 mg/dL Final   GFR, Estimated 12/12/2021 >60  >60 mL/min Final   Comment: (NOTE) Calculated using the CKD-EPI Creatinine Equation (2021)    Anion gap 12/12/2021  10  5 - 15 Final   Performed at [facility], [place] [address] [place], [place] [address]   Alcohol, Ethyl (B) 12/12/2021 <10  <10 mg/dL Final   Comment: (NOTE) Lowest detectable limit for serum alcohol is 10 mg/dL.  For medical purposes only. Performed at [facility], [place] [address] [place], [place] [address]    Opiates 12/12/2021 NONE DETECTED  NONE DETECTED Final   Cocaine 12/12/2021 POSITIVE (A)  NONE DETECTED Final   Benzodiazepines 12/12/2021 NONE DETECTED  NONE DETECTED Final   Amphetamines 12/12/2021 NONE DETECTED  NONE DETECTED Final   Tetrahydrocannabinol 12/12/2021 POSITIVE (A)  NONE DETECTED Final   Barbiturates 12/12/2021 NONE DETECTED  NONE DETECTED Final   Comment: (NOTE) DRUG SCREEN FOR MEDICAL PURPOSES ONLY.  IF CONFIRMATION IS NEEDED FOR ANY PURPOSE, NOTIFY LAB WITHIN 5 DAYS.  LOWEST DETECTABLE LIMITS FOR URINE DRUG SCREEN Drug Class                     Cutoff (ng/mL) Amphetamine  and metabolites    1000 Barbiturate and metabolites    200 Benzodiazepine                 229 Tricyclics and metabolites     300 Opiates and metabolites        300 Cocaine and metabolites        300 THC                            50 Performed at [facility], [place] [address] [place], [place] [address]    WBC 12/12/2021 8.7  4.0 - 10.5 K/uL Final   RBC 12/12/2021 5.23  4.22 - 5.81 MIL/uL Final   Hemoglobin 12/12/2021 14.3  13.0 - 17.0 g/dL Final   HCT 12/12/2021 43.9  39.0 - 52.0 % Final   MCV 12/12/2021 83.9  80.0 - 100.0 fL Final   MCH 12/12/2021 27.3  26.0 - 34.0 pg Final   MCHC 12/12/2021 32.6  30.0 - 36.0 g/dL Final   RDW 12/12/2021 13.2  11.5 - 15.5 % Final   Platelets 12/12/2021 288  150 - 400 K/uL Final   nRBC 12/12/2021 0.0  0.0 - 0.2 % Final   Neutrophils Relative % 12/12/2021 64  % Final   Neutro Abs 12/12/2021 5.7  1.7 - 7.7 K/uL Final   Lymphocytes Relative 12/12/2021 27  % Final   Lymphs Abs 12/12/2021 2.3  0.7 - 4.0 K/uL Final   Monocytes Relative 12/12/2021 7  % Final   Monocytes Absolute 12/12/2021 0.6  0.1 - 1.0 K/uL Final   Eosinophils Relative 12/12/2021 1  % Final   Eosinophils Absolute 12/12/2021 0.1  0.0 - 0.5 K/uL Final   Basophils Relative 12/12/2021 1  % Final   Basophils Absolute 12/12/2021 0.1  0.0 - 0.1 K/uL Final   Immature Granulocytes 12/12/2021 0  % Final   Abs Immature Granulocytes 12/12/2021 0.02  0.00 - 0.07 K/uL Final   Performed at [facility], [place] [address] [place], [place] [address]   Salicylate Lvl 74/07/1447 <7.0 (L)  7.0 - 30.0 mg/dL Final   Performed at [place] [address], [place], [place] [address]   Acetaminophen (Tylenol), Serum 12/12/2021 <10 (L)  10 - 30 ug/mL Final   Comment: (NOTE) Therapeutic concentrations vary significantly. A range of 10-30 ug/mL  may be an effective concentration for  many patients. However, some  are best treated at concentrations outside of this range. Acetaminophen  concentrations >150 ug/mL at 4 hours after ingestion  and >50 ug/mL at 12 hours after ingestion are often associated with  toxic reactions.  Performed at [facility], [place] [address] [place], [place] [address]   Admission on 11/29/2021, Discharged on 11/30/2021  Component Date Value Ref Range Status   Opiates 11/29/2021 NONE DETECTED  NONE DETECTED Final   Cocaine 11/29/2021 POSITIVE (A)  NONE DETECTED Final   Benzodiazepines 11/29/2021 NONE DETECTED  NONE DETECTED Final   Amphetamines 11/29/2021 NONE DETECTED  NONE DETECTED Final   Tetrahydrocannabinol 11/29/2021 POSITIVE (A)  NONE DETECTED Final   Barbiturates 11/29/2021 NONE DETECTED  NONE DETECTED Final   Comment: (NOTE) DRUG SCREEN FOR MEDICAL PURPOSES ONLY.  IF CONFIRMATION IS NEEDED FOR ANY PURPOSE, NOTIFY LAB WITHIN 5 DAYS.  LOWEST DETECTABLE LIMITS FOR URINE DRUG SCREEN Drug Class                     Cutoff (ng/mL) Amphetamine and metabolites    1000 Barbiturate and metabolites    200 Benzodiazepine                 604 Tricyclics and metabolites     300 Opiates and metabolites        300 Cocaine and metabolites        300 THC                            50 Performed at [facility], [place] [address] [place], [place] [address]   Admission on 10/27/2021, Discharged on 10/27/2021  Component Date Value Ref Range Status   Color, Urine 10/27/2021 YELLOW  YELLOW Final   APPearance 10/27/2021 CLEAR  CLEAR Final   Specific Gravity, Urine 10/27/2021 1.014  1.005 - 1.030 Final   pH 10/27/2021 6.0  5.0 - 8.0 Final   Glucose, UA 10/27/2021 NEGATIVE  NEGATIVE mg/dL Final   Hgb urine dipstick 10/27/2021 NEGATIVE  NEGATIVE Final   Bilirubin Urine 10/27/2021 NEGATIVE  NEGATIVE Final   Ketones, ur 10/27/2021 20 (A)  NEGATIVE mg/dL Final   Protein, ur 10/27/2021 NEGATIVE  NEGATIVE mg/dL Final   Nitrite 10/27/2021 NEGATIVE  NEGATIVE Final   Leukocytes,Ua 10/27/2021 NEGATIVE  NEGATIVE Final   Performed at [facility], [place] [address], [place], [place] [address]   Specimen Description 10/27/2021 URINE, CLEAN CATCH   Final   Special Requests 10/27/2021 NONE   Final   Culture 10/27/2021  (A)   Final                   Value:<10,000 COLONIES/mL INSIGNIFICANT GROWTH Performed at [facility], [place] [address] [place], [place] [address]    Report Status 10/27/2021 10/29/2021 FINAL   Final  Admission on 10/08/2021, Discharged on 10/23/2021  Component Date Value Ref Range Status   TSH 10/09/2021 1.675  0.350 - 4.500 uIU/mL Final   Comment: Performed by a 3rd Generation assay with a functional sensitivity of <=0.01 uIU/mL. Performed at [facility], [place] [address] [place],  [address]    Cholesterol 10/09/2021 172  0 - 200 mg/dL Final   Triglycerides 10/09/2021 131  <150 mg/dL Final   HDL 10/09/2021 50  >40 mg/dL Final   Total CHOL/HDL Ratio 10/09/2021 3.4  RATIO Final   VLDL 10/09/2021 26  0 -  40 mg/dL Final   LDL Cholesterol 10/09/2021 96  0 - 99 mg/dL Final   Comment:        Total Cholesterol/HDL:CHD Risk Coronary Heart Disease Risk Table                     Men   Women  1/2 Average Risk   3.4   3.3  Average Risk       5.0   4.4  2 X Average Risk   9.6   7.1  3 X Average Risk  23.4   11.0        Use the calculated Patient Ratio above and the CHD Risk Table to determine the patient's CHD Risk.        ATP III CLASSIFICATION (LDL):  <100     mg/dL   Optimal  100-129  mg/dL   Near or Above                    Optimal  130-159  mg/dL   Borderline  160-189  mg/dL   High  >190     mg/dL   Very High Performed at [place] [address], [place], [place] [address]    Hgb A1c MFr Bld 10/09/2021 5.0  4.8 - 5.6 % Final   Comment: (NOTE) Pre diabetes:          5.7%-6.4%  Diabetes:              >6.4%  Glycemic control for   <7.0% adults with diabetes    Mean Plasma Glucose 10/09/2021 96.8  mg/dL Final   Performed at [place], [place] [address], [place], [place] [address]  Admission on 10/07/2021, Discharged on 10/08/2021  Component Date Value Ref Range Status   Sodium 10/07/2021 136  135 - 145 mmol/L Final   Potassium 10/07/2021 3.8  3.5 - 5.1 mmol/L Final   Chloride 10/07/2021 103  98 - 111 mmol/L Final   CO2 10/07/2021 22  22 - 32 mmol/L Final   Glucose, Bld 10/07/2021 71  70 - 99 mg/dL Final   Glucose reference range applies only to samples taken after fasting for at least 8 hours.   BUN 10/07/2021 16  6 - 20 mg/dL Final   Creatinine, Ser 10/07/2021 0.90  0.61 - 1.24 mg/dL Final   Calcium 10/07/2021 9.4  8.9 - 10.3 mg/dL Final   Total Protein 10/07/2021 7.3  6.5 - 8.1 g/dL Final   Albumin 10/07/2021 4.2  3.5 - 5.0 g/dL Final   AST 10/07/2021 25  15 - 41 U/L Final   ALT 10/07/2021 17  0 - 44 U/L Final   Alkaline Phosphatase 10/07/2021 48  38 - 126 U/L Final   Total Bilirubin 10/07/2021 1.2  0.3 - 1.2 mg/dL Final   GFR, Estimated 10/07/2021 >60  >60 mL/min Final   Comment: (NOTE) Calculated using the CKD-EPI Creatinine Equation (2021)    Anion gap 10/07/2021 11  5 - 15 Final   Performed at [place] [address] [place], [place] [address]   Alcohol, Ethyl (B) 10/07/2021 <10  <10 mg/dL Final   Comment: (NOTE) Lowest detectable limit for serum alcohol is 10 mg/dL.  For medical purposes only. Performed at [facility], [place] [address] [place], [place] [address]    Salicylate Lvl 96/22/2979 <7.0 (L)  7.0 - 30.0 mg/dL Final   Performed at [facility], [address] [place], [place] [address]   Acetaminophen (Tylenol), Serum 10/07/2021 <10 (L)  10 - 30 ug/mL Final   Comment: (NOTE) Therapeutic concentrations vary significantly. A range of 10-30 ug/mL  may be an effective concentration for many patients. However, some  are best treated at concentrations outside of this range. Acetaminophen concentrations >150 ug/mL at 4 hours after ingestion  and >50 ug/mL at 12 hours after  ingestion are often associated with  toxic reactions.  Performed at [facility], [place] [address] [place], [place] [address]    WBC 10/07/2021 7.4  4.0 - 10.5 K/uL Final   RBC 10/07/2021 4.85  4.22 - 5.81 MIL/uL Final   Hemoglobin 10/07/2021 13.4  13.0 - 17.0 g/dL Final   HCT 10/07/2021 39.4  39.0 - 52.0 % Final   MCV 10/07/2021 81.2  80.0 - 100.0 fL Final   MCH 10/07/2021 27.6  26.0 - 34.0 pg Final   MCHC 10/07/2021 34.0  30.0 - 36.0 g/dL Final   RDW 10/07/2021 13.4  11.5 - 15.5 % Final   Platelets 10/07/2021 299  150 - 400 K/uL Final   nRBC 10/07/2021 0.0  0.0 - 0.2 % Final   Performed at [facility] [address] [place], [place] [address]   Opiates 10/07/2021 NONE DETECTED  NONE DETECTED Final   Cocaine 10/07/2021 POSITIVE (A)  NONE DETECTED Final   Benzodiazepines 10/07/2021 NONE DETECTED  NONE DETECTED Final   Amphetamines 10/07/2021 NONE DETECTED  NONE DETECTED Final   Tetrahydrocannabinol 10/07/2021 NONE DETECTED  NONE DETECTED Final   Barbiturates 10/07/2021 NONE DETECTED  NONE DETECTED Final   Comment: (NOTE) DRUG SCREEN FOR MEDICAL PURPOSES ONLY.  IF CONFIRMATION IS NEEDED FOR ANY PURPOSE, NOTIFY LAB WITHIN 5 DAYS.  LOWEST DETECTABLE LIMITS FOR URINE DRUG SCREEN Drug Class                     Cutoff (ng/mL) Amphetamine and metabolites    1000 Barbiturate and metabolites    200 Benzodiazepine                 621 Tricyclics and metabolites     300 Opiates and metabolites        300 Cocaine and metabolites        300 THC                            50 Performed at [facility], [place] [address] [place], [place] [address]    SARS Coronavirus 2 by RT PCR 10/08/2021 NEGATIVE  NEGATIVE Final   Comment: (NOTE) SARS-CoV-2 target nucleic acids are NOT DETECTED.  The SARS-CoV-2 RNA is generally detectable in upper respiratory specimens during the acute phase of infection. The lowest concentration of SARS-CoV-2 viral copies this assay can detect  is 138 copies/mL. A negative result does not preclude SARS-Cov-2 infection and should not be used as the sole basis for treatment or other patient management decisions. A negative result may occur with  improper specimen collection/handling, submission of specimen other than nasopharyngeal swab, presence of viral mutation(s) within the areas targeted by this assay, and inadequate number of viral copies(<138 copies/mL). A negative result must be combined with clinical observations, patient history, and epidemiological information. The expected result is Negative.  Fact Sheet for Patients:  EntrepreneurPulse.com.au  Fact Sheet for Healthcare Providers:  IncredibleEmployment.be  This test is no  t yet approved or cleared by the [place] and  has been authorized for detection and/or diagnosis of SARS-CoV-2 by FDA under an Emergency Use Authorization (EUA). This EUA will remain  in effect (meaning this test can be used) for the duration of the COVID-19 declaration under Section 564(b)(1) of the Act, 21 U.S.C.section 360bbb-3(b)(1), unless the authorization is terminated  or revoked sooner.       Influenza A by PCR 10/08/2021 NEGATIVE  NEGATIVE Final   Influenza B by PCR 10/08/2021 NEGATIVE  NEGATIVE Final   Comment: (NOTE) The Xpert Xpress SARS-CoV-2/FLU/RSV plus assay is intended as an aid in the diagnosis of influenza from Nasopharyngeal swab specimens and should not be used as a sole basis for treatment. Nasal washings and aspirates are unacceptable for Xpert Xpress SARS-CoV-2/FLU/RSV testing.  Fact Sheet for Patients: EntrepreneurPulse.com.au  Fact Sheet for Healthcare Providers: IncredibleEmployment.be  This test is not yet approved or cleared by the [place] FDA and has been authorized for detection and/or diagnosis of SARS-CoV-2 by FDA under an Emergency Use  Authorization (EUA). This EUA will remain in effect (meaning this test can be used) for the duration of the COVID-19 declaration under Section 564(b)(1) of the Act, 21 U.S.C. section 360bbb-3(b)(1), unless the authorization is terminated or revoked.  Performed at [facility], [place] [address] [place], [place] [address]     Allergies: Patient has no known allergies.  PTA Medications: (Not in a hospital admission)   Medical Decision Making  Patient is a 26 year old male with past psychiatric and medical history as stated above who presents to the [facility] as an involuntary direct admit transfer for continuous assessment from [facility] ED under IVC for SI and worsening AVH, as well as feelings of paranoia (see HPI for details).  Based on patient's current presentation, patient appears to be a potential threat to himself at this time and thus, I agree with the original recommendation for continuous assessment for the patient at this time.    Recommendations  Based on my evaluation the patient does not appear to have an emergency medical condition.  Patient was medically cleared in the emergency department.  Patient will be admitted to [facility] continuous assessment for further crisis stabilization and treatment.  Patient will be reevaluated by the treatment team on 01/29/2022 and disposition to be determined at that time.  Further decisions regarding patient's IVC status to be made at time of 01/29/2022 reevaluation as well.  01/28/2022 ED labs/tests reviewed:  -PCR Flu A&B, COVID: Negative  -UDS: Positive for cocaine  -CBC with differential: Within normal limits  -CMP: BUN slightly elevated at 22 mg/dL.  CMP otherwise unremarkable.  -Ethanol: Less than 10 mg/dL/within normal limits.  -Hemoglobin A1c added onto patient's previous blood draw due to patient's history of being on antipsychotic medications.  Hemoglobin A1c within normal limits at 5.2%.  -EKG: Normal sinus rhythm with sinus  arrhythmia with no acute/concerning findings, no significant changes compared to previous tracings, and QT/QTC of 356/381 ms.  QT/QTC is appropriate for continuation of antipsychotic medications at this time.  Additional previous labs reviewed due to patient's history of taking antipsychotic medications:  -12/22/2021 TSH: Within normal limits at 1.02 UIU/mL  -12/13/2021 lipid panel: Within normal limits  Patient not currently taking any home medications at this time (see HPI for details).  Patient reports that the psychotropic medications that he was on during his December 2022 [facility] inpatient hospitalization (Risperdal, Cogentin, Abilify, propranolol see HPI for dosing details)  were helpful for his psychiatric symptoms and he states that he tolerated these medications well.  Discussed with the patient reinitiating these medications from this previous 11/2021 [facility] admission and patient agreed to this plan.  Will reinitiate the following medications that patient was previously discharged on from previous 11/2021 [facility] admission at the following doses:  -Abilify 5 mg p.o. daily for schizoaffective disorder bipolar type, mood stability  -Risperdal 1 mg p.o. twice daily for schizoaffective disorder, bipolar type  -Cogentin 0.5 mg p.o. twice daily for EPS caused by medications  -Propranolol 10 mg p.o. twice daily for neuroleptic induced akathisia  Additional as needed medications ordered:  -Tylenol 650 mg p.o. every 6 hours as needed for mild pain  -Maalox/Mylanta 30 mL p.o. every 4 hours as needed for indigestion  -Hydroxyzine 25 mg p.o. 3 times daily as needed for anxiety  -Milk of Magnesia 30 mL p.o. daily as needed for mild constipation  -Trazodone 50 mg p.o. at bedtime as needed for sleep  Patient educated on side effect profiles of hydroxyzine and trazodone.   Prescilla Sours, PA-C 01/29/22  2:31 AM

## 2022-01-29 NOTE — ED Notes (Signed)
Pt A&O x 4, presents with suicidal ideations, pt  took some pills he reports, admits to history of SI attempts, would not go into detail.  Pt admits to hearing voices telling  him to hurt others.  Pt calm & cooperative, comfort measures given.  No distress noted.  Monitoring for safety.  Pt is IVC.

## 2022-01-29 NOTE — ED Notes (Signed)
Dispatch called for pt transfer to [facility]

## 2022-01-29 NOTE — Progress Notes (Signed)
Patient ID: Tyler White, male   DOB: Oct 07, 1996, 26 y.o.   MRN: [id number]    Pt alert and oriented during [facility] admission process. Pt denies SI/HI and VH, but endorses AH. Pt oriented to unit and given admission packet. Education, support, reassurance, and encouragement provided, q15 minute safety checks initiated. Pt's belongings in locker # 3, and belongings sheet signed. Pt denies any concerns at this time, and verbally contracts for safety. Pt ambulating on the unit with no issues. Pt remains safe on and off the unit.

## 2022-01-29 NOTE — ED Notes (Signed)
Pt was given hot lunch and juice.

## 2022-01-29 NOTE — ED Provider Notes (Signed)
FBC/OBS ASAP Discharge Summary  Date and Time: 01/29/2022 11:46 AM  Name: Tyler White  MRN:  [id number]   Discharge Diagnoses:  Final diagnoses:  Schizoaffective disorder, bipolar type ([place])  Cocaine use disorder ([place])  Cannabis use disorder    Tyler White, 26 y.o., male patient with psychiatric history of schizoaffective disorder bipolar type, cocaine abuse, cocaine induced mood disorder, cannabis abuse, and akathisia.  Patient admitted to continuous assessment after presenting as a walk in at [facility] under IVC from [facility], ED where patient initially presented with complaints of suicidal ideation but later denied.  Patient was saying seen face to face by this provider, consulted with Dr. Ernie Hew; and chart reviewed on 01/29/22.  On evaluation Tyler White continues to report auditory/visual hallucinations.  Patient denies suicidal ideation but appears to be guarded at and only given minimal information.  Continue to recommend inpatient psychiatric treatment   Total Time spent with patient: 15 minutes  Past Psychiatric History: See above Past Medical History:  Past Medical History:  Diagnosis Date   Hernia, inguinal, right    Inguinal hernia    right   Psychiatric illness    Schizophrenia ([place])    History reviewed. No pertinent surgical history. Family History:  Family History  Problem Relation Age of Onset   Psychiatric Illness Mother    Hypertension Sister    Family Psychiatric History: None reported Social History:  Social History   Substance and Sexual Activity  Alcohol Use Not Currently     Social History   Substance and Sexual Activity  Drug Use Not Currently   Types: Marijuana, Cocaine   Comment: denies currently    Social History   Socioeconomic History   Marital status: Single    Spouse name: Not on file   Number of children: Not on file   Years of education: 10   Highest education level: Not on  file  Occupational History   Not on file  Tobacco Use   Smoking status: Every Day    Packs/day: 0.50    Years: 5.00    Pack years: 2.50    Types: Cigarettes   Smokeless tobacco: Never  Vaping Use   Vaping Use: Never used  Substance and Sexual Activity   Alcohol use: Not Currently   Drug use: Not Currently    Types: Marijuana, Cocaine    Comment: denies currently   Sexual activity: Yes    Birth control/protection: None  Other Topics Concern   Not on file  Social History Narrative   ** Merged History Encounter **       ** Merged History Encounter **       Social Determinants of Health   Financial Resource Strain: Not on file  Food Insecurity: Not on file  Transportation Needs: Not on file  Physical Activity: Not on file  Stress: Not on file  Social Connections: Not on file   SDOH:  SDOH Screenings   Alcohol Screen: Low Risk    Last Alcohol Screening Score (AUDIT): 0  Depression (PHQ2-9): Medium Risk   PHQ-2 Score: 17  Financial Resource Strain: Not on file  Food Insecurity: Not on file  Housing: Not on file  Physical Activity: Not on file  Social Connections: Not on file  Stress: Not on file  Tobacco Use: High Risk   Smoking Tobacco Use: Every Day   Smokeless Tobacco Use: Never   Passive Exposure: Not on file  Transportation Needs: Not on file  Tobacco Cessation:  A prescription for an FDA-approved tobacco cessation medication provided at discharge  Current Medications:  Current Facility-Administered Medications  Medication Dose Route Frequency Provider Last Rate Last Admin   acetaminophen (TYLENOL) tablet 650 mg  650 mg Oral Q6H PRN Prescilla Sours, PA-C       alum & mag hydroxide-simeth (MAALOX/MYLANTA) 200-200-20 MG/5ML suspension 30 mL  30 mL Oral Q4H PRN [name] W, PA-C       ARIPiprazole (ABILIFY) tablet 5 mg  5 mg Oral Daily [name] W, PA-C   5 mg at 01/29/22 8938   benztropine (COGENTIN) tablet 0.5 mg  0.5 mg Oral BID [name] W,  PA-C   0.5 mg at 01/29/22 1017   hydrOXYzine (ATARAX) tablet 25 mg  25 mg Oral TID PRN Prescilla Sours, PA-C       magnesium hydroxide (MILK OF MAGNESIA) suspension 30 mL  30 mL Oral Daily PRN [name] W, PA-C       propranolol (INDERAL) tablet 10 mg  10 mg Oral BID [name] W, PA-C   10 mg at 01/29/22 5102   risperiDONE (RISPERDAL) tablet 1 mg  1 mg Oral BID [name] W, PA-C   1 mg at 01/29/22 5852   traZODone (DESYREL) tablet 50 mg  50 mg Oral QHS PRN Prescilla Sours, PA-C       Current Outpatient Medications  Medication Sig Dispense Refill   ARIPiprazole (ABILIFY) 5 MG tablet Take 1 tablet (5 mg total) by mouth daily. (Patient not taking: Reported on 01/28/2022) 30 tablet 0   benztropine (COGENTIN) 0.5 MG tablet Take 1 tablet (0.5 mg total) by mouth 2 (two) times daily. (Patient not taking: Reported on 01/28/2022) 60 tablet 0   propranolol (INDERAL) 10 MG tablet Take 1 tablet (10 mg total) by mouth 2 (two) times daily. (Patient not taking: Reported on 01/28/2022) 60 tablet 0   risperiDONE (RISPERDAL) 2 MG tablet Take 1 tablet (2 mg total) by mouth 2 (two) times daily. (Patient not taking: Reported on 01/29/2022) 60 tablet 0    PTA Medications: (Not in a hospital admission)   Musculoskeletal  Strength & Muscle Tone: within normal limits Gait & Station: normal Patient leans: N/A  Psychiatric Specialty Exam  Presentation  General Appearance: Appropriate for Environment  Eye Contact:Fair  Speech:Clear and Coherent; Normal Rate  Speech Volume:Decreased  Handedness:Right   Mood and Affect  Mood:Depressed  Affect:Congruent   Thought Process  Thought Processes:Coherent; Goal Directed  Descriptions of Associations:Intact  Orientation:Full (Time, Place and Person)  Thought Content:Paranoid Ideation  Diagnosis of Schizophrenia or Schizoaffective disorder in past: Yes  Duration of Psychotic Symptoms: Greater than six months   Hallucinations:Hallucinations: Auditory;  Visual Description of Auditory Hallucinations: states can't understand what is being said Description of Visual Hallucinations: States he is seeing dark objects  Ideas of Reference:Paranoia  Suicidal Thoughts:Suicidal Thoughts: No  Homicidal Thoughts:Homicidal Thoughts: No   Sensorium  Memory:Immediate Fair; Recent Fair  Judgment:Fair  Insight:Fair; Present   Executive Functions  Concentration:Good  Attention Span:Fair; [place]  Language:Good   Psychomotor Activity  Psychomotor Activity:Psychomotor Activity: Normal   Assets  Assets:Communication Skills; Desire for Improvement; Leisure Time   Sleep  Sleep:Sleep: Fair Number of Hours of Sleep: 5   Nutritional Assessment (For OBS and FBC admissions only) Has the patient had a weight loss or gain of 10 pounds or more in the last 3 months?: No Has the patient had a  decrease in food intake/or appetite?: No Does the patient have dental problems?: No Does the patient have eating habits or behaviors that may be indicators of an eating disorder including binging or inducing vomiting?: No Has the patient recently lost weight without trying?: 0 Has the patient been eating poorly because of a decreased appetite?: 0 Malnutrition Screening Tool Score: 0    Physical Exam  Physical Exam Vitals and nursing note reviewed. Exam conducted with a chaperone present.  Constitutional:      General: He is not in acute distress.    Appearance: Normal appearance. He is not ill-appearing, toxic-appearing or diaphoretic.  Cardiovascular:     Rate and Rhythm: Normal rate.  Pulmonary:     Effort: Pulmonary effort is normal. No respiratory distress.  Musculoskeletal:        General: Normal range of motion.     Cervical back: Normal range of motion.  Skin:    General: Skin is warm and dry.  Neurological:     General: No focal deficit present.     Mental Status: He is alert and oriented to  person, place, and time.     Comments: No tremor noted.   Psychiatric:        Attention and Perception: He perceives auditory and visual hallucinations.        Mood and Affect: Mood is depressed.        Speech: Speech normal.        Behavior: Behavior is not agitated, slowed, aggressive, withdrawn, hyperactive or combative. Behavior is cooperative.        Thought Content: Thought content is paranoid. Thought content is not delusional. Thought content does not include homicidal or suicidal ideation.        Judgment: Judgment is impulsive.     Comments: Affect mood-congruent.    Review of Systems  Constitutional:  Positive for weight loss. Negative for chills, diaphoresis, fever and malaise/fatigue.  HENT:  Negative for congestion.   Respiratory:  Negative for cough and shortness of breath.   Cardiovascular:  Negative for chest pain and palpitations.  Gastrointestinal:  Negative for abdominal pain, constipation, diarrhea, nausea and vomiting.  Musculoskeletal:  Negative for joint pain and myalgias.  Neurological:  Negative for dizziness and headaches.  Psychiatric/Behavioral:  Positive for depression, hallucinations and substance abuse. Negative for memory loss and suicidal ideas. The patient has insomnia.   All other systems reviewed and are negative. Blood pressure 116/70, pulse 62, temperature 98.2 F (36.8 C), temperature source Oral, resp. rate 16, SpO2 100 %. There is no height or weight on file to calculate BMI.    Disposition: Patient recommended for inpatient treatment and has been accepted by [facility] Brentin Shin, NP 01/29/2022, 11:46 AM

## 2022-01-29 NOTE — Progress Notes (Signed)
°   01/29/22 2300  Psych Admission Type (Psych Patients Only)  Admission Status Involuntary  Psychosocial Assessment  Patient Complaints None  Eye Contact Avertive  Facial Expression Flat  Affect Blunted  Speech Soft;Logical/coherent  Interaction Minimal  Motor Activity Other (Comment) (WNL)  Appearance/Hygiene Unremarkable  Behavior Characteristics Cooperative  Mood Suspicious;Anxious;Preoccupied  Thought Process  Coherency WDL  Content WDL  Delusions None reported or observed  Perception WDL  Hallucination None reported or observed  Judgment Impaired  Confusion None  Danger to Self  Current suicidal ideation? Denies  Self-Injurious Behavior No self-injurious ideation or behavior indicators observed or expressed   Agreement Not to Harm Self Yes  Description of Agreement verbally contracts for safety  Danger to Others  Danger to Others None reported or observed

## 2022-01-29 NOTE — ED Notes (Signed)
Pt awakened earlier this am.  Was given cereal.  Currently laying quietly in bed  No distress noted.

## 2022-01-30 ENCOUNTER — Encounter (HOSPITAL_COMMUNITY): Payer: Self-pay

## 2022-01-30 DIAGNOSIS — F250 Schizoaffective disorder, bipolar type: Principal | ICD-10-CM

## 2022-01-30 MED ORDER — NICOTINE 21 MG/24HR TD PT24
21.0000 mg | MEDICATED_PATCH | Freq: Every day | TRANSDERMAL | Status: DC
  Filled 2022-01-30 (×9): qty 1

## 2022-01-30 MED ORDER — TRAZODONE HCL 50 MG PO TABS
50.0000 mg | ORAL_TABLET | Freq: Every evening | ORAL | Status: DC | PRN
  Administered 2022-01-30 – 2022-02-07 (×7): 50 mg via ORAL
  Filled 2022-01-30 (×4): qty 1
  Filled 2022-01-30: qty 7
  Filled 2022-01-30 (×3): qty 1

## 2022-01-30 MED ORDER — ALUM & MAG HYDROXIDE-SIMETH 200-200-20 MG/5ML PO SUSP: 30.0000 mL | ORAL | Status: DC | PRN

## 2022-01-30 MED ORDER — ACETAMINOPHEN 325 MG PO TABS
650.0000 mg | ORAL_TABLET | Freq: Four times a day (QID) | ORAL | Status: DC | PRN
  Administered 2022-02-01 – 2022-02-08 (×2): 650 mg via ORAL
  Filled 2022-01-30 (×2): qty 2

## 2022-01-30 MED ORDER — HYDROXYZINE HCL 25 MG PO TABS
25.0000 mg | ORAL_TABLET | Freq: Three times a day (TID) | ORAL | Status: DC | PRN
  Administered 2022-01-30 – 2022-02-07 (×11): 25 mg via ORAL
  Filled 2022-01-30 (×7): qty 1
  Filled 2022-01-30: qty 10
  Filled 2022-01-30 (×4): qty 1

## 2022-01-30 MED ORDER — MAGNESIUM HYDROXIDE 400 MG/5ML PO SUSP: 30.0000 mL | Freq: Every day | ORAL | Status: DC | PRN

## 2022-01-30 NOTE — Group Note (Signed)
LCSW Group Therapy Note   Group Date: 01/30/2022 Start Time: 1300 End Time: 1400   Type of Therapy and Topic:  Group Therapy: Circle of [place]  Participation Level:  Did not attend   Summary of Patient Progress:  Did not attend    Therapeutic Modalities:   [place], LCSW 01/30/2022  2:22 PM
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
## 2022-01-30 NOTE — BHH Suicide Risk Assessment (Addendum)
Suicide Risk Assessment  Admission Assessment    [facility] Admission Suicide Risk Assessment   Nursing information obtained from:  Patient Demographic factors:  Male, Low socioeconomic status  Current Mental Status: Alert & oriented x 3, Currently experiencing auditory hallucinations.  Loss Factors: NA  Historical Factors: Chronic mental health issues, Cannabis use disorder.  Risk Reduction Factors: Positive therapeutic relationship.  Total Time spent with patient: 20 minutes  Principal Problem: Schizoaffective disorder, bipolar type ([place])  Diagnosis:  Principal Problem:   Schizoaffective disorder, bipolar type ([place]) Active Problems:   Schizoaffective disorder ([place])  Subjective Data: (Per admission evaluation notes): This is one of several psychiatric admissions in this [facility] for this 26 year old AA male with hx of Schizoaffective disorder & cocaine use disorder. He is known in this [facility] from his previous admissions for worsening psychosis. He was discharged from this [facility] last December after mood stabilization treatments. He was discharged with medication & a referral to an outpatient psychiatric clinic for medication management & routine psychiatric care. Tyler White is known to be non-complaint to his treatment regimen & has not always made his outpatient recommended follow-up care. He is admitted to the [facility] this time around with complain of worsening auditory hallucination telling him to hurt himself. His UDS was positive for cocaine. During this evaluation, Tyler White reports,   "A couple of days ago, I was trying to go to sleep, I saw something on the hallway that looks like human, half-man, half-woman. He was trying to talk to me in a whispering tone. I could not make out what he was saying to me. I have not been taking my medicines in 6 months. I felt like I don't need the medicines. But I feel like I need it now. I was on Risperdal, Abilify, Cogentin & Propranolol. When I was taking these  medicines, they kept me off of drugs. I have been using a little bit of crack, I smoked it. I have been smoking crack for two years. The last time I used crack was before I came in to the hospital. I'm slightly depressed, but right now, I'm feeling the side effects of drugs. I'm not feeling suicidal or homicidal. I just need to get back on my medicines".   Continued Clinical Symptoms:  Alcohol Use Disorder Identification Test Final Score (AUDIT): 0 The "Alcohol Use Disorders Identification Test", Guidelines for Use in Primary Care, Second Edition.  World Pharmacologist [facility]). Score between 0-7:  no or low risk or alcohol related problems. Score between 8-15:  moderate risk of alcohol related problems. Score between 16-19:  high risk of alcohol related problems. Score 20 or above:  warrants further diagnostic evaluation for alcohol dependence and treatment.  CLINICAL FACTORS:   Alcohol/Substance Abuse/Dependencies Schizophrenia:   Command hallucinatons Paranoid or undifferentiated type Unstable or Poor Therapeutic Relationship Previous Psychiatric Diagnoses and Treatments   Musculoskeletal: Strength & Muscle Tone: within normal limits Gait & Station: normal Patient leans: N/A  Psychiatric Specialty Exam:  Presentation  General Appearance: Appropriate for Environment  Eye Contact:Fair  Speech:Clear and Coherent; Normal Rate  Speech Volume:Decreased  Handedness:Right  Mood and Affect  Mood:Depressed  Affect:Congruent  Thought Process  Thought Processes:Coherent; Goal Directed  Descriptions of Associations:Intact  Orientation:Full (Time, Place and Person)  Thought Content:Paranoid Ideation  History of Schizophrenia/Schizoaffective disorder:Yes  Duration of Psychotic Symptoms:Greater than six months  Hallucinations:Hallucinations: Auditory; Visual Description of Auditory Hallucinations: states can't understand what is being said Description of Visual  Hallucinations: States he is seeing  dark objects  Ideas of Reference:Paranoia  Suicidal Thoughts:Suicidal Thoughts: No  Homicidal Thoughts:Homicidal Thoughts: No  Sensorium  Memory:Immediate Fair; Recent Fair  Judgment:Fair  Insight:Fair; Present  Executive Functions  Concentration:Good  Attention Span:Fair; [place]  Language:Good  Psychomotor Activity  Psychomotor Activity:Psychomotor Activity: Normal  Assets  Assets:Communication Skills; Desire for Improvement; Leisure Time  Sleep  Sleep:Sleep: Fair Number of Hours of Sleep: 5  Physical Exam: Physical Exam Vitals and nursing note reviewed.  HENT:     Head: Normocephalic.     Nose: Nose normal.     Mouth/Throat:     Pharynx: Oropharynx is clear.  Eyes:     Pupils: Pupils are equal, round, and reactive to light.  Cardiovascular:     Rate and Rhythm: Normal rate.     Pulses: Normal pulses.  Pulmonary:     Effort: Pulmonary effort is normal.  Genitourinary:    Comments: Deferred Musculoskeletal:        General: Normal range of motion.     Cervical back: Normal range of motion.  Skin:    General: Skin is warm and dry.  Neurological:     General: No focal deficit present.     Mental Status: He is alert and oriented to person, place, and time.   Review of Systems  Constitutional:  Negative for chills, diaphoresis and fever.  HENT:  Negative for congestion and sore throat.   Eyes:  Negative for blurred vision.  Respiratory:  Negative for cough, shortness of breath and wheezing.   Cardiovascular:  Negative for chest pain and palpitations.  Gastrointestinal:  Negative for abdominal pain, constipation, diarrhea, heartburn, nausea and vomiting.  Genitourinary:  Negative for dysuria.  Musculoskeletal:  Negative for joint pain and myalgias.  Skin: Negative.   Neurological:  Negative for dizziness, tingling, tremors, sensory change, speech change, focal weakness, seizures,  loss of consciousness, weakness and headaches.  Endo/Heme/Allergies:        Allergies: NKDA  Psychiatric/Behavioral:  Positive for depression, hallucinations and substance abuse (UDS (+) for cocaine). Negative for memory loss and suicidal ideas. The patient is nervous/anxious and has insomnia.   Blood pressure 107/73, pulse 97, temperature (!) 97.1 F (36.2 C), temperature source Oral, resp. rate 16, height 5\' 8"  (1.727 m), weight 69.9 kg, SpO2 100 %. Body mass index is 23.42 kg/m.   COGNITIVE FEATURES THAT CONTRIBUTE TO RISK:  None    SUICIDE RISK:   Mild:  Suicidal ideation of limited frequency, intensity, duration, and specificity.  There are no identifiable plans, no associated intent, mild dysphoria and related symptoms, good self-control (both objective and subjective assessment), few other risk factors, and identifiable protective factors, including available and accessible social support.  PLAN OF CARE: Treatment Plan Summary: Daily contact with patient to assess and evaluate symptoms and progress in treatment and Medication management.    Treatment Plan/Recommendations: 1. Admit for crisis management and stabilization, estimated length of stay 3-5 days.    Diagnoses. Bipolar disorder, bipolar-type.   2. Medication management to reduce current symptoms to base line and improve the patient's overall level of functioning: See [facility] for plan of care. -start Abilify 5 mg po daily for Schizoaffective disorder. -start Risperdal 1 mg po bid for Schizoaffective disorder. -start Benztropine 0.5 mg po bid for prevention of eps. -start Propranolol 10 mg po bid for anxiety.    Agitation protocols. -start Zyprexa zydis 10 mg po Q 8 hrs prn. & -Lorazepam 1 mg po prn x  1 dose.  & Geodon 20 mg po IM x 1 dose.   Other prns. Acetaminophen 650 mg po Q 6 hrs prn for fever/pain.  MOM 30 ml po Q daily prn for constipation. Mylanta 30 ml po Q 4 hrs prn for indigestion.   I certify that  inpatient services furnished can reasonably be expected to improve the patient's condition.   Lindell Spar, NP, pmhnp, fnp-bc 01/30/2022, 4:28 PM  Total Time Spent in Direct Patient Care:  I personally spent 60 minutes on the unit in direct patient care. The direct patient care time included face-to-face time with the patient, reviewing the patient's chart, communicating with other professionals, and coordinating care. Greater than 50% of this time was spent in counseling or coordinating care with the patient regarding goals of hospitalization, psycho-education, and discharge planning needs.  I have independently evaluated the patient during a face-to-face assessment on 01/30/22. I reviewed the patient's chart, and I participated in key portions of the service. I discussed the case with the APP, and I agree with the assessment and plan of care as documented in the APP's note , as addended by me or notated below:  Pt is having command AH to harm himself.  I agree with SRA otherwise.  See my attestation of H&P.   Janine Limbo, MD Psychiatrist

## 2022-01-30 NOTE — BH IP Treatment Plan (Signed)
Interdisciplinary Treatment and Diagnostic Plan Update  01/30/2022 Time of Session: 9:40am  Tyler White MRN: [id number]  Principal Diagnosis: Schizoaffective disorder, bipolar type ([place])  Secondary Diagnoses: Principal Problem:   Schizoaffective disorder, bipolar type ([place])   Current Medications:  Current Facility-Administered Medications  Medication Dose Route Frequency Provider Last Rate Last Admin   ARIPiprazole (ABILIFY) tablet 5 mg  5 mg Oral Daily Lindon Romp A, NP   5 mg at 01/30/22 0827   benztropine (COGENTIN) tablet 0.5 mg  0.5 mg Oral BID Lindon Romp A, NP   0.5 mg at 01/30/22 0827   OLANZapine zydis (ZYPREXA) disintegrating tablet 10 mg  10 mg Oral Q8H PRN Rozetta Nunnery, NP       And   LORazepam (ATIVAN) tablet 1 mg  1 mg Oral PRN Rozetta Nunnery, NP       And   ziprasidone (GEODON) injection 20 mg  20 mg Intramuscular PRN Rozetta Nunnery, NP       propranolol (INDERAL) tablet 10 mg  10 mg Oral BID Lindon Romp A, NP   10 mg at 01/30/22 0827   risperiDONE (RISPERDAL) tablet 1 mg  1 mg Oral BID Lindon Romp A, NP   1 mg at 01/30/22 0827   PTA Medications: Medications Prior to Admission  Medication Sig Dispense Refill Last Dose   ARIPiprazole (ABILIFY) 5 MG tablet Take 1 tablet (5 mg total) by mouth daily. (Patient not taking: Reported on 01/28/2022) 30 tablet 0    benztropine (COGENTIN) 0.5 MG tablet Take 1 tablet (0.5 mg total) by mouth 2 (two) times daily. (Patient not taking: Reported on 01/28/2022) 60 tablet 0    hydrOXYzine (ATARAX) 25 MG tablet Take 1 tablet (25 mg total) by mouth 3 (three) times daily as needed for anxiety. 30 tablet 0    propranolol (INDERAL) 10 MG tablet Take 1 tablet (10 mg total) by mouth 2 (two) times daily. (Patient not taking: Reported on 01/28/2022) 60 tablet 0    risperiDONE (RISPERDAL) 1 MG tablet Take 1 tablet (1 mg total) by mouth 2 (two) times daily.      traZODone (DESYREL) 50 MG tablet Take 1 tablet (50 mg total) by mouth at bedtime  as needed for sleep.       Patient Stressors: Financial difficulties   Medication change or noncompliance    Patient Strengths: Average or above average intelligence  Motivation for treatment/growth   Treatment Modalities: Medication Management, Group therapy, Case management,  1 to 1 session with clinician, Psychoeducation, Recreational therapy.   Physician Treatment Plan for Primary Diagnosis: Schizoaffective disorder, bipolar type ([place]) Long Term Goal(s): Improvement in symptoms so as ready for discharge   Short Term Goals: Ability to identify and develop effective coping behaviors will improve Ability to maintain clinical measurements within normal limits will improve Compliance with prescribed medications will improve Ability to identify triggers associated with substance abuse/mental health issues will improve Ability to identify changes in lifestyle to reduce recurrence of condition will improve Ability to verbalize feelings will improve Ability to disclose and discuss suicidal ideas Ability to demonstrate self-control will improve  Medication Management: Evaluate patient's response, side effects, and tolerance of medication regimen.  Therapeutic Interventions: 1 to 1 sessions, Unit Group sessions and Medication administration.  Evaluation of Outcomes: Not Met  Physician Treatment Plan for Secondary Diagnosis: Principal Problem:   Schizoaffective disorder, bipolar type ([place])  Long Term Goal(s): Improvement in symptoms so as ready for discharge   Short Term Goals:  Ability to identify and develop effective coping behaviors will improve Ability to maintain clinical measurements within normal limits will improve Compliance with prescribed medications will improve Ability to identify triggers associated with substance abuse/mental health issues will improve Ability to identify changes in lifestyle to reduce recurrence of condition will improve Ability to verbalize feelings  will improve Ability to disclose and discuss suicidal ideas Ability to demonstrate self-control will improve     Medication Management: Evaluate patient's response, side effects, and tolerance of medication regimen.  Therapeutic Interventions: 1 to 1 sessions, Unit Group sessions and Medication administration.  Evaluation of Outcomes: Not Met   RN Treatment Plan for Primary Diagnosis: Schizoaffective disorder, bipolar type ([place]) Long Term Goal(s): Knowledge of disease and therapeutic regimen to maintain health will improve  Short Term Goals: Ability to remain free from injury will improve, Ability to participate in decision making will improve, Ability to verbalize feelings will improve, Ability to disclose and discuss suicidal ideas, and Ability to identify and develop effective coping behaviors will improve  Medication Management: RN will administer medications as ordered by provider, will assess and evaluate patient's response and provide education to patient for prescribed medication. RN will report any adverse and/or side effects to prescribing provider.  Therapeutic Interventions: 1 on 1 counseling sessions, Psychoeducation, Medication administration, Evaluate responses to treatment, Monitor vital signs and CBGs as ordered, Perform/monitor CIWA, COWS, AIMS and Fall Risk screenings as ordered, Perform wound care treatments as ordered.  Evaluation of Outcomes: Not Met   LCSW Treatment Plan for Primary Diagnosis: Schizoaffective disorder, bipolar type ([place]) Long Term Goal(s): Safe transition to appropriate next level of care at discharge, Engage patient in therapeutic group addressing interpersonal concerns.  Short Term Goals: Engage patient in aftercare planning with referrals and resources, Increase social support, Increase emotional regulation, Facilitate acceptance of mental health diagnosis and concerns, Identify triggers associated with mental health/substance abuse issues, and  Increase skills for wellness and recovery  Therapeutic Interventions: Assess for all discharge needs, 1 to 1 time with Social worker, Explore available resources and support systems, Assess for adequacy in community support network, Educate family and significant other(s) on suicide prevention, Complete Psychosocial Assessment, Interpersonal group therapy.  Evaluation of Outcomes: Not Met   Progress in Treatment: Attending groups: Yes. Participating in groups: Yes. Taking medication as prescribed: Yes. Toleration medication: Yes. Family/Significant other contact made: Yes, individual(s) contacted:  Brother  Patient understands diagnosis: No. Discussing patient identified problems/goals with staff: Yes. Medical problems stabilized or resolved: Yes. Denies suicidal/homicidal ideation: Yes. Issues/concerns per patient self-inventory: No.   New problem(s) identified: No, Describe:  None   New Short Term/Long Term Goal(s): medication stabilization, elimination of SI thoughts, development of comprehensive mental wellness plan.   Patient Goals: "To get medications and resources"   Discharge Plan or Barriers: Patient recently admitted. CSW will continue to follow and assess for appropriate referrals and possible discharge planning.   Reason for Continuation of Hospitalization: Hallucinations Medication stabilization Suicidal ideation  Estimated Length of Stay: 3 to 5 days    Scribe for Treatment Team: Carney Harder 01/30/2022 2:53 PM

## 2022-01-30 NOTE — H&P (Signed)
Psychiatric Admission Assessment Adult  Patient Identification: Tyler White  MRN:  [id number]  Date of Evaluation:  01/30/2022  Chief Complaint:  Schizoaffective disorder, bipolar type ([place]) [F25.0]  Principal Diagnosis: Schizoaffective disorder, bipolar type ([place])  Diagnosis:  Principal Problem:   Schizoaffective disorder, bipolar type ([place])  History of Present Illness: This is one of several psychiatric admissions in this [facility] for this 26 year old AA male with hx of Schizoaffective disorder & cocaine use disorder. He is known in this [facility] from his previous admissions for worsening psychosis. He was discharged from this [facility] last December after mood stabilization treatments. He was discharged with medication & a referral to an outpatient psychiatric clinic for medication management & routine psychiatric care. Darrick is known to be non-complaint to his treatment regimen & has not always made his outpatient recommended follow-up care. He is admitted to the [facility] this time around with complain of worsening auditory hallucination telling him to hurt himself. His UDS was positive for cocaine. During this evaluation, Ebrima reports,   "A couple of days ago, I was trying to go to sleep, I saw something on the hallway that looks like human, half-man, half-woman. He was trying to talk to me in a whispering tone. I could not make out what he was saying to me. I have not been taking my medicines in 6 months. I felt like I don't need the medicines. But I feel like I need it now. I was on Risperdal, Abilify, Cogentin & Propranolol. When I was taking these medicines, they kept me off of drugs. I have been using a little bit of crack, I smoked it. I have been smoking crack for two years. The last time I used crack was before I came in to the hospital. I'm slightly depressed, but right now, I'm feeling the side effects of drugs. I'm not feeling suicidal or homicidal. I just need to get back on my  medicines".  Associated Signs/Symptoms:  Depression Symptoms:   Patient currently denies any symptoms of depression  Duration of Depression Symptoms: Less than two weeks  (Hypo) Manic Symptoms:  Delusions, Hallucinations,  Anxiety Symptoms:  Excessive Worry,  Psychotic Symptoms:  Delusions, Hallucinations: Auditory Visual  PTSD Symptoms: NA  Total Time spent with patient: 1 hour  Past Psychiatric History: Schizoaffective disorder, bipolar-type.  Is the patient at risk to self? No.  Has the patient been a risk to self in the past 6 months? Yes.    Has the patient been a risk to self within the distant past? Yes.    Is the patient a risk to others? No.  Has the patient been a risk to others in the past 6 months? No.  Has the patient been a risk to others within the distant past? No.   Prior Inpatient Therapy: [facility] x numerous times. Prior Outpatient Therapy: Yes.  Alcohol Screening: 1. How often do you have a drink containing alcohol?: Never 2. How many drinks containing alcohol do you have on a typical day when you are drinking?: 1 or 2 3. How often do you have six or more drinks on one occasion?: Never AUDIT-C Score: 0 9. Have you or someone else been injured as a result of your drinking?: No 10. Has a relative or friend or a doctor or another health worker been concerned about your drinking or suggested you cut down?: No Alcohol Use Disorder Identification Test Final Score (AUDIT): 0  Substance Abuse History in the last 12 months:  Yes.  Consequences of Substance Abuse: Discussed witg patient during this admission evaluation. Medical Consequences:  Liver damage, Possible death by overdose Legal Consequences:  Arrests, jail time, Loss of driving privilege. Family Consequences:  Family discord, divorce and or separation.   Previous Psychotropic Medications: Yes   Psychological Evaluations: No   Past Medical History:  Past Medical History:  Diagnosis Date    Hernia, inguinal, right    Inguinal hernia    right   Psychiatric illness    Schizophrenia ([place])    History reviewed. No pertinent surgical history.  Family History:  Family History  Problem Relation Age of Onset   Psychiatric Illness Mother    Hypertension Sister    Family Psychiatric  History: Mental illness: Mother.  Tobacco Screening: Reports smokes 6 cigarettes daily.  Social History: Single, has 1 child, unemployed, Lives in [place], [place]. Social History   Substance and Sexual Activity  Alcohol Use Not Currently     Social History   Substance and Sexual Activity  Drug Use Not Currently   Types: Marijuana, Cocaine   Comment: denies currently    Additional Social History:  Allergies:  No Known Allergies.   Lab Results:  Results for orders placed or performed during the hospital encounter of 01/28/22 (from the past 48 hour(s))  Resp Panel by RT-PCR (Flu A&B, Covid) Nasopharyngeal Swab     Status: None   Collection Time: 01/28/22  5:19 PM   Specimen: Nasopharyngeal Swab; Nasopharyngeal(NP) swabs in vial transport medium  Result Value Ref Range   SARS Coronavirus 2 by RT PCR NEGATIVE NEGATIVE    Comment: (NOTE) SARS-CoV-2 target nucleic acids are NOT DETECTED.  The SARS-CoV-2 RNA is generally detectable in upper respiratory specimens during the acute phase of infection. The lowest concentration of SARS-CoV-2 viral copies this assay can detect is 138 copies/mL. A negative result does not preclude SARS-Cov-2 infection and should not be used as the sole basis for treatment or other patient management decisions. A negative result may occur with  improper specimen collection/handling, submission of specimen other than nasopharyngeal swab, presence of viral mutation(s) within the areas targeted by this assay, and inadequate number of viral copies(<138 copies/mL). A negative result must be combined with clinical observations, patient history, and  epidemiological information. The expected result is Negative.  Fact Sheet for Patients:  EntrepreneurPulse.com.au  Fact Sheet for Healthcare Providers:  IncredibleEmployment.be  This test is no t yet approved or cleared by the [place] FDA and  has been authorized for detection and/or diagnosis of SARS-CoV-2 by FDA under an Emergency Use Authorization (EUA). This EUA will remain  in effect (meaning this test can be used) for the duration of the COVID-19 declaration under Section 564(b)(1) of the Act, 21 U.S.C.section 360bbb-3(b)(1), unless the authorization is terminated  or revoked sooner.       Influenza A by PCR NEGATIVE NEGATIVE   Influenza B by PCR NEGATIVE NEGATIVE    Comment: (NOTE) The Xpert Xpress SARS-CoV-2/FLU/RSV plus assay is intended as an aid in the diagnosis of influenza from Nasopharyngeal swab specimens and should not be used as a sole basis for treatment. Nasal washings and aspirates are unacceptable for Xpert Xpress SARS-CoV-2/FLU/RSV testing.  Fact Sheet for Patients: EntrepreneurPulse.com.au  Fact Sheet for Healthcare Providers: IncredibleEmployment.be  This test is not yet approved or cleared by the [place] FDA and has been authorized for detection and/or diagnosis of SARS-CoV-2 by FDA under an Emergency Use Authorization (EUA). This EUA will remain in effect (meaning this  test can be used) for the duration of the COVID-19 declaration under Section 564(b)(1) of the Act, 21 U.S.C. section 360bbb-3(b)(1), unless the authorization is terminated or revoked.  Performed at [facility], [place] [address] [place], [place] [address]   Comprehensive metabolic panel     Status: Abnormal   Collection Time: 01/28/22  5:25 PM  Result Value Ref Range   Sodium 138 135 - 145 mmol/L   Potassium 4.1 3.5 - 5.1 mmol/L   Chloride 105 98 - 111 mmol/L   CO2 23 22 - 32 mmol/L    Glucose, Bld 84 70 - 99 mg/dL    Comment: Glucose reference range applies only to samples taken after fasting for at least 8 hours.   BUN 22 (H) 6 - 20 mg/dL   Creatinine, Ser 1.17 0.61 - 1.24 mg/dL   Calcium 9.3 8.9 - 10.3 mg/dL   Total Protein 7.1 6.5 - 8.1 g/dL   Albumin 4.2 3.5 - 5.0 g/dL   AST 34 15 - 41 U/L   ALT 27 0 - 44 U/L   Alkaline Phosphatase 54 38 - 126 U/L   Total Bilirubin 0.7 0.3 - 1.2 mg/dL   GFR, Estimated >60 >60 mL/min    Comment: (NOTE) Calculated using the CKD-EPI Creatinine Equation (2021)    Anion gap 10 5 - 15    Comment: Performed at [facility] [address] [place], [place] [address]  Ethanol     Status: None   Collection Time: 01/28/22  5:25 PM  Result Value Ref Range   Alcohol, Ethyl (B) <10 <10 mg/dL    Comment: (NOTE) Lowest detectable limit for serum alcohol is 10 mg/dL.  For medical purposes only. Performed at [facility], [place] [address], [place], [place] [address]   CBC with Diff     Status: None   Collection Time: 01/28/22  5:25 PM  Result Value Ref Range   WBC 8.1 4.0 - 10.5 K/uL   RBC 5.17 4.22 - 5.81 MIL/uL   Hemoglobin 14.3 13.0 - 17.0 g/dL   HCT 42.1 39.0 - 52.0 %   MCV 81.4 80.0 - 100.0 fL   MCH 27.7 26.0 - 34.0 pg   MCHC 34.0 30.0 - 36.0 g/dL   RDW 13.7 11.5 - 15.5 %   Platelets 262 150 - 400 K/uL   nRBC 0.0 0.0 - 0.2 %   Neutrophils Relative % 62 %   Neutro Abs 5.0 1.7 - 7.7 K/uL   Lymphocytes Relative 30 %   Lymphs Abs 2.4 0.7 - 4.0 K/uL   Monocytes Relative 6 %   Monocytes Absolute 0.5 0.1 - 1.0 K/uL   Eosinophils Relative 1 %   Eosinophils Absolute 0.1 0.0 - 0.5 K/uL   Basophils Relative 1 %   Basophils Absolute 0.1 0.0 - 0.1 K/uL   Immature Granulocytes 0 %   Abs Immature Granulocytes 0.02 0.00 - 0.07 K/uL    Comment: Performed at [facility], [address] [place], [place] [address]  Hemoglobin A1c     Status: None   Collection Time: 01/28/22  5:25 PM  Result Value Ref Range   Hgb A1c  MFr Bld 5.2 4.8 - 5.6 %    Comment: (NOTE) Pre diabetes:          5.7%-6.4%  Diabetes:              >6.4%  Glycemic control for   <7.0% adults with diabetes  Mean Plasma Glucose 102.54 mg/dL    Comment: Performed at [facility] [address] [place], [place] [address]  Urine rapid drug screen (hosp performed)     Status: Abnormal   Collection Time: 01/28/22  5:34 PM  Result Value Ref Range   Opiates NONE DETECTED NONE DETECTED   Cocaine POSITIVE (A) NONE DETECTED   Benzodiazepines NONE DETECTED NONE DETECTED   Amphetamines NONE DETECTED NONE DETECTED   Tetrahydrocannabinol NONE DETECTED NONE DETECTED   Barbiturates NONE DETECTED NONE DETECTED    Comment: (NOTE) DRUG SCREEN FOR MEDICAL PURPOSES ONLY.  IF CONFIRMATION IS NEEDED FOR ANY PURPOSE, NOTIFY LAB WITHIN 5 DAYS.  LOWEST DETECTABLE LIMITS FOR URINE DRUG SCREEN Drug Class                     Cutoff (ng/mL) Amphetamine and metabolites    1000 Barbiturate and metabolites    200 Benzodiazepine                 865 Tricyclics and metabolites     300 Opiates and metabolites        300 Cocaine and metabolites        300 THC                            50 Performed at [facility], [place] [address] [place], [place] [address]    Blood Alcohol level:  Lab Results  Component Value Date   ETH <10 01/28/2022   ETH <10 62/95/2841   Metabolic Disorder Labs:  Lab Results  Component Value Date   HGBA1C 5.2 01/28/2022   MPG 102.54 01/28/2022   MPG 96.8 10/09/2021   Lab Results  Component Value Date   PROLACTIN 32.5 (H) 05/30/2018   Lab Results  Component Value Date   CHOL 161 12/13/2021   TRIG 81 12/13/2021   HDL 75 12/13/2021   CHOLHDL 2.1 12/13/2021   VLDL 16 12/13/2021   LDLCALC 70 12/13/2021   LDLCALC 96 10/09/2021   Current Medications: Current Facility-Administered Medications  Medication Dose Route Frequency Provider Last Rate Last Admin   ARIPiprazole (ABILIFY) tablet 5 mg  5 mg Oral  Daily Lindon Romp A, NP   5 mg at 01/30/22 0827   benztropine (COGENTIN) tablet 0.5 mg  0.5 mg Oral BID Lindon Romp A, NP   0.5 mg at 01/30/22 0827   OLANZapine zydis (ZYPREXA) disintegrating tablet 10 mg  10 mg Oral Q8H PRN Rozetta Nunnery, NP       And   LORazepam (ATIVAN) tablet 1 mg  1 mg Oral PRN Rozetta Nunnery, NP       And   ziprasidone (GEODON) injection 20 mg  20 mg Intramuscular PRN Rozetta Nunnery, NP       propranolol (INDERAL) tablet 10 mg  10 mg Oral BID Lindon Romp A, NP   10 mg at 01/30/22 0827   risperiDONE (RISPERDAL) tablet 1 mg  1 mg Oral BID Lindon Romp A, NP   1 mg at 01/30/22 0827   PTA Medications: Medications Prior to Admission  Medication Sig Dispense Refill Last Dose   ARIPiprazole (ABILIFY) 5 MG tablet Take 1 tablet (5 mg total) by mouth daily. (Patient not taking: Reported on 01/28/2022) 30 tablet 0    benztropine (COGENTIN) 0.5 MG tablet Take 1 tablet (0.5 mg total) by mouth 2 (two) times daily. (Patient not taking: Reported  on 01/28/2022) 60 tablet 0    hydrOXYzine (ATARAX) 25 MG tablet Take 1 tablet (25 mg total) by mouth 3 (three) times daily as needed for anxiety. 30 tablet 0    propranolol (INDERAL) 10 MG tablet Take 1 tablet (10 mg total) by mouth 2 (two) times daily. (Patient not taking: Reported on 01/28/2022) 60 tablet 0    risperiDONE (RISPERDAL) 1 MG tablet Take 1 tablet (1 mg total) by mouth 2 (two) times daily.      traZODone (DESYREL) 50 MG tablet Take 1 tablet (50 mg total) by mouth at bedtime as needed for sleep.      Musculoskeletal: Strength & Muscle Tone: within normal limits Gait & Station: normal Patient leans: N/A  Psychiatric Specialty Exam:  Presentation  General Appearance: Appropriate for Environment  Eye Contact:Fair  Speech:Clear and Coherent; Normal Rate  Speech Volume:Decreased  Handedness:Right  Mood and Affect  Mood:Depressed  Affect:Congruent  Thought Process  Thought Processes:Coherent; Goal  Directed  Duration of Psychotic Symptoms: Greater than six months  Past Diagnosis of Schizophrenia or Psychoactive disorder: Yes  Descriptions of Associations:Intact  Orientation:Full (Time, Place and Person)  Thought Content:Paranoid Ideation  Hallucinations:Hallucinations: Auditory; Visual Description of Auditory Hallucinations: states can't understand what is being said Description of Visual Hallucinations: States he is seeing dark objects  Ideas of Reference:Paranoia  Suicidal Thoughts:Suicidal Thoughts: No  Homicidal Thoughts:Homicidal Thoughts: No  Sensorium  Memory:Immediate Fair; Recent Fair  Judgment:Fair  Insight:Fair; Present  Executive Functions  Concentration:Good  Attention Span:Fair; [place]  Language:Good  Psychomotor Activity  Psychomotor Activity:Psychomotor Activity: Normal  Assets  Assets:Communication Skills; Desire for Improvement; Leisure Time  Sleep  Sleep:Sleep: Fair Number of Hours of Sleep: 5  Physical Exam: Physical Exam Vitals and nursing note reviewed.  HENT:     Head: Normocephalic.     Nose: Nose normal.     Mouth/Throat:     Pharynx: Oropharynx is clear.  Eyes:     Pupils: Pupils are equal, round, and reactive to light.  Cardiovascular:     Rate and Rhythm: Normal rate.     Pulses: Normal pulses.  Pulmonary:     Effort: Pulmonary effort is normal.  Genitourinary:    Comments: Deferred Musculoskeletal:        General: Normal range of motion.     Cervical back: Normal range of motion.  Skin:    General: Skin is warm and dry.  Neurological:     General: No focal deficit present.     Mental Status: He is alert and oriented to person, place, and time.   Review of Systems  Constitutional:  Negative for chills, diaphoresis and fever.  HENT:  Negative for congestion and sore throat.   Eyes:  Negative for blurred vision.  Respiratory:  Negative for cough, shortness of breath and  wheezing.   Cardiovascular:  Negative for chest pain and palpitations.  Gastrointestinal:  Negative for abdominal pain, constipation, diarrhea, heartburn, nausea and vomiting.  Genitourinary:  Negative for dysuria.  Musculoskeletal:  Negative for joint pain and myalgias.  Skin: Negative.   Neurological:  Negative for dizziness, tingling, tremors, sensory change, speech change, focal weakness, seizures, loss of consciousness, weakness and headaches.  Endo/Heme/Allergies:        Allergies: NKDA  Blood pressure 107/73, pulse 97, temperature (!) 97.1 F (36.2 C), temperature source Oral, resp. rate 16, height 5\' 8"  (1.727 m), weight 69.9 kg, SpO2 100 %. Body mass index is 23.42 kg/m.  Treatment Plan Summary: Daily contact with patient to assess and evaluate symptoms and progress in treatment and Medication management.   Treatment Plan/Recommendations: 1. Admit for crisis management and stabilization, estimated length of stay 3-5 days.   Diagnoses. Bipolar disorder, bipolar-type.  2. Medication management to reduce current symptoms to base line and improve the patient's overall level of functioning: See [facility] for plan of care. -Continue Abilify 5 mg po daily for Schizoaffective disorder. -Continue Risperdal 1 mg po bid for Schizoaffective disorder. -Continue Benztropine 0.5 mg po bid for prevention of eps. -Continue Propranolol 10 mg po bid for anxiety.   Agitation protocols. -Continue Zyprexa zydis 10 mg po Q 8 hrs prn. & -Lorazepam 1 mg po prn x 1 dose.  & Geodon 20 mg po IM x 1 dose.  Other prns. Acetaminophen 650 mg po Q 6 hrs prn for fever/pain.  MOM 30 ml po Q daily prn for constipation. Mylanta 30 ml po Q 4 hrs prn for indigestion.  3. Treat health problems as indicated.  4. Develop treatment plan to decrease risk of relapse upon discharge and the need for readmission.  5. Psycho-social education regarding relapse prevention and self care.  6. Health care follow up as needed  for medical problems.  7. Review, reconcile, and reinstate any pertinent home medications for other health issues where appropriate. 8. Call for consults with hospitalist for any additional specialty patient care services as needed.   Observation Level/Precautions:  15 minute checks  Laboratory: Per ED:  Current lab results reviewed.  Psychotherapy: Enrolled in the group milieu.  Medications: See MAR.   Consultations: As needed.   Discharge Concerns: Safety, mood stability.  Estimated LOS: 5-7 days  Other: Admitted to the [address]-hall.   Physician Treatment Plan for Primary Diagnosis: Schizoaffective disorder, bipolar type ([place])  Long Term Goal(s): Improvement in symptoms so as ready for discharge  Short Term Goals: Ability to identify changes in lifestyle to reduce recurrence of condition will improve, Ability to verbalize feelings will improve, Ability to disclose and discuss suicidal ideas, and Ability to demonstrate self-control will improve  Physician Treatment Plan for Secondary Diagnosis: Principal Problem:   Schizoaffective disorder, bipolar type ([place])  Long Term Goal(s): Improvement in symptoms so as ready for discharge  Short Term Goals: Ability to identify and develop effective coping behaviors will improve, Ability to maintain clinical measurements within normal limits will improve, Compliance with prescribed medications will improve, and Ability to identify triggers associated with substance abuse/mental health issues will improve  I certify that inpatient services furnished can reasonably be expected to improve the patient's condition.    Lindell Spar, NP, pmhnp, fnp-bc 2/1/202310:28 AM

## 2022-01-30 NOTE — Progress Notes (Signed)
NP Einar Pheasant ordered Trazodone and Vistaril for pt due to pt not having anything for sleep or anxiety, and was given to pt per [facility] at pt request    01/30/22 2100  Psych Admission Type (Psych Patients Only)  Admission Status Involuntary  Psychosocial Assessment  Patient Complaints Suspiciousness  Eye Contact Brief  Facial Expression Flat  Affect Flat  Speech Logical/coherent  Interaction Minimal  Motor Activity Other (Comment) (WNL)  Appearance/Hygiene Unremarkable  Behavior Characteristics Cooperative  Mood Preoccupied  Thought Process  Coherency WDL  Content WDL  Delusions None reported or observed  Perception WDL  Hallucination None reported or observed  Judgment Impaired  Confusion None  Danger to Self  Current suicidal ideation? Denies  Self-Injurious Behavior No self-injurious ideation or behavior indicators observed or expressed   Agreement Not to Harm Self Yes  Description of Agreement verbally contracts for safety  Danger to Others  Danger to Others None reported or observed

## 2022-01-30 NOTE — Group Note (Signed)
Recreation Therapy Group Note   Group Topic:Team Building  Group Date: 01/30/2022 Start Time: 7373 End Time: 1020 Facilitators: Victorino Sparrow, LRT,CTRS Location: [address] Dayroom   Goal Area(s) Addresses:  Patient will effectively work with peer towards shared goal.  Patient will identify skills used to make activity successful.  Patient will identify how skills used during activity can be applied to reach post d/c goals.   Group Description: [employer]. In teams of 5-6, patients were given 25 small craft pipe cleaners. Using the materials provided, patients were instructed to compete against the opposing team(s) to build the tallest free-standing structure from floor level. The activity was timed; difficulty increased by Probation officer as Pharmacist, hospital continued.  Systematically resources were removed with additional directions for example, placing one arm behind their back, working in silence, and shape stipulations. LRT facilitated post-activity discussion reviewing team processes and necessary communication skills involved in completion. Patients were encouraged to reflect how the skills utilized, or not utilized, in this activity can be incorporated to positively impact support systems post discharge.   Affect/Mood: N/A   Participation Level: Did not attend    Clinical Observations/Individualized Feedback:     Plan: Continue to engage patient in RT group sessions 2-3x/week.   Victorino Sparrow, LRT,CTRS 01/30/2022 11:16 AM

## 2022-01-30 NOTE — Plan of Care (Signed)
°  Problem: Health Behavior/Discharge Planning: Goal: Compliance with treatment plan for underlying cause of condition will improve Outcome: Progressing   Problem: Safety: Goal: Periods of time without injury will increase Outcome: Progressing   Problem: Safety: Goal: Ability to redirect hostility and anger into socially appropriate behaviors will improve Outcome: Progressing   Problem: Safety: Goal: Ability to remain free from injury will improve Outcome: Progressing   Problem: Self-Concept: Goal: Will verbalize positive feelings about self Outcome: Progressing

## 2022-01-30 NOTE — [facility] Suicide Risk Assessment (Addendum)
[place] INPATIENT:  Family/Significant Other Suicide Prevention Education  Suicide Prevention Education:  Contact Attempts: brother Lonzo Candy [phone]),  has been identified by the patient as the family member/significant other with whom the patient will be residing, and identified as the person(s) who will aid the patient in the event of a mental health crisis.  With written consent from the patient, two attempts were made to provide suicide prevention education, prior to and/or following the patient's discharge.  We were unsuccessful in providing suicide prevention education.  A suicide education pamphlet was given to the patient to share with family/significant other.  Date and time of first attempt:01/30/2022 / 11:50am CSW unable to leave voicemail due to mailbox not being set up.  Date and time of second attempt: 02/01/2022  / 2:11pm CSW unable to leave voicemail due to mailbox not being set up.  Illene Labrador Rumbley 01/30/2022, 11:54 AM  Toney Reil, [place] Social Worker [place]

## 2022-01-30 NOTE — Progress Notes (Signed)
Recreation Therapy Notes  INPATIENT RECREATION THERAPY ASSESSMENT  Patient Details Name: Tyler White MRN: [id number] DOB: [birth year] Today's Date: 01/30/2022       Information Obtained From: Patient  Able to Participate in Assessment/Interview: Yes  Patient Presentation: Alert  Reason for Admission (Per Patient): Other (Comments) (Hallucinations; Hearing Whispers)  Patient Stressors: Other (Comment) (Not having anywhere to go)  Coping Skills:   Isolation, Self-Injury, Substance Abuse, Talk, Prayer, Avoidance, Read, Hot Bath/Shower  Leisure Interests (2+):  Exercise - Walking  Frequency of Recreation/Participation: Other (Comment) (Daily)  Awareness of Community Resources:  No  Community Resources:     Current Use:    If no, Barriers?:    Expressed Interest in [place]: No  County of Residence:  [place]  Patient Main Form of Transportation: [employer], building services)  Patient Strengths:  Humor; Personality  Patient Identified Areas of Improvement:  School, Work  Patient Goal for Hospitalization:  "talk to SW, try to get disability appt rescheduled"  Current SI (including self-harm):  No  Current HI:  No  Current AVH: No  Staff Intervention Plan: Group Attendance, Collaborate with Interdisciplinary Treatment Team  Consent to Intern Participation: N/A   Victorino Sparrow, Vickki Muff, [place] 01/30/2022, 12:41 PM

## 2022-01-30 NOTE — Progress Notes (Signed)
°   01/30/22 0500  Sleep  Number of Hours 8.75

## 2022-01-31 DIAGNOSIS — F1510 Other stimulant abuse, uncomplicated: Secondary | ICD-10-CM

## 2022-01-31 MED ORDER — RISPERIDONE 1 MG PO TABS
1.0000 mg | ORAL_TABLET | Freq: Every day | ORAL | Status: DC
  Administered 2022-02-01: 1 mg via ORAL
  Filled 2022-01-31 (×3): qty 1

## 2022-01-31 MED ORDER — RISPERIDONE 1 MG PO TABS
1.5000 mg | ORAL_TABLET | Freq: Every day | ORAL | Status: DC
  Administered 2022-01-31: 1.5 mg via ORAL
  Filled 2022-01-31 (×3): qty 1

## 2022-01-31 NOTE — Progress Notes (Signed)
Pt was encouraged but didn't attend orientation/goals group.

## 2022-01-31 NOTE — [facility] Group Notes (Signed)
Pt didn't attend group. 

## 2022-01-31 NOTE — Progress Notes (Addendum)
Patient ID: Tyler White, male   DOB: 09-Dec-1996, 26 y.o.   MRN: [id number]   Pt observed pacing on the unit and reported being "a little anxious." Atarax 25 mg PRN given.

## 2022-01-31 NOTE — Progress Notes (Signed)
[facility] MD Progress Note  01/31/2022 9:54 PM Tyler White  MRN:  [id number] Subjective:    This is one of several psychiatric admissions in this [facility] for this 26 year old AA male with hx of Schizoaffective disorder & cocaine use disorder. He was admitted for command auditory hallucinations to harm himself and other, and had recently cut himself with a knife.   On exam today, the pt reports that Ah are still present but less demanding since restarting medications upon admission. He reports that mood is still depressed and sleep is up and down. Concentration is poor and there is thought blocking. Appetite is stable.  Pt denies having suicidal thoughts and homicidal thoughts.  Reports feeling more restless since re-starting medications.  Pt agreeable with increasing Risperdal to address psychotic symptoms.  Denies other s/e to current meds.   Principal Problem: Schizoaffective disorder, bipolar type ([place]) Diagnosis: Principal Problem:   Schizoaffective disorder, bipolar type ([place]) Active Problems:   Schizoaffective disorder ([place])  Total Time spent with patient: 15 minutes  Past Psychiatric History: see H&P  Past Medical History:  Past Medical History:  Diagnosis Date   Hernia, inguinal, right    Inguinal hernia    right   Psychiatric illness    Schizophrenia ([place])    History reviewed. No pertinent surgical history. Family History:  Family History  Problem Relation Age of Onset   Psychiatric Illness Mother    Hypertension Sister    Family Psychiatric  History: see H&P Social History:  Social History   Substance and Sexual Activity  Alcohol Use Not Currently     Social History   Substance and Sexual Activity  Drug Use Not Currently   Types: Marijuana, Cocaine   Comment: denies currently    Social History   Socioeconomic History   Marital status: Single    Spouse name: Not on file   Number of children: Not on file   Years of education: 10   Highest education level:  Not on file  Occupational History   Not on file  Tobacco Use   Smoking status: Every Day    Packs/day: 0.50    Years: 5.00    Pack years: 2.50    Types: Cigarettes   Smokeless tobacco: Never  Vaping Use   Vaping Use: Never used  Substance and Sexual Activity   Alcohol use: Not Currently   Drug use: Not Currently    Types: Marijuana, Cocaine    Comment: denies currently   Sexual activity: Yes    Birth control/protection: None  Other Topics Concern   Not on file  Social History Narrative   ** Merged History Encounter **       ** Merged History Encounter **       Social Determinants of Health   Financial Resource Strain: Not on file  Food Insecurity: Not on file  Transportation Needs: Not on file  Physical Activity: Not on file  Stress: Not on file  Social Connections: Not on file   Additional Social History:                         Sleep: Fair  Appetite:  Fair  Current Medications: Current Facility-Administered Medications  Medication Dose Route Frequency Provider Last Rate Last Admin   acetaminophen (TYLENOL) tablet 650 mg  650 mg Oral Q6H PRN Nwoko, Herbert Pun I, NP       alum & mag hydroxide-simeth (MAALOX/MYLANTA) 200-200-20 MG/5ML suspension 30 mL  30  mL Oral Q4H PRN Lindell Spar I, NP       ARIPiprazole (ABILIFY) tablet 5 mg  5 mg Oral Daily Lindon Romp A, NP   5 mg at 01/31/22 0809   benztropine (COGENTIN) tablet 0.5 mg  0.5 mg Oral BID Lindon Romp A, NP   0.5 mg at 01/31/22 1818   hydrOXYzine (ATARAX) tablet 25 mg  25 mg Oral TID PRN Prescilla Sours, PA-C   25 mg at 01/31/22 2100   OLANZapine zydis (ZYPREXA) disintegrating tablet 10 mg  10 mg Oral Q8H PRN Rozetta Nunnery, NP       And   LORazepam (ATIVAN) tablet 1 mg  1 mg Oral PRN Rozetta Nunnery, NP       And   ziprasidone (GEODON) injection 20 mg  20 mg Intramuscular PRN Lindon Romp A, NP       magnesium hydroxide (MILK OF MAGNESIA) suspension 30 mL  30 mL Oral Daily PRN Nwoko, Herbert Pun I, NP        nicotine (NICODERM CQ - dosed in mg/24 hours) patch 21 mg  21 mg Transdermal Q0600 Lindell Spar I, NP       propranolol (INDERAL) tablet 10 mg  10 mg Oral BID Lindon Romp A, NP   10 mg at 01/31/22 1818   [START ON 02/01/2022] risperiDONE (RISPERDAL) tablet 1 mg  1 mg Oral Daily Darnelle Corp, MD       risperiDONE (RISPERDAL) tablet 1.5 mg  1.5 mg Oral QHS Kasen Sako, MD   1.5 mg at 01/31/22 2100   traZODone (DESYREL) tablet 50 mg  50 mg Oral QHS PRN Prescilla Sours, PA-C   50 mg at 01/31/22 2100    Lab Results: No results found for this or any previous visit (from the past 48 hour(s)).  Blood Alcohol level:  Lab Results  Component Value Date   ETH <10 01/28/2022   ETH <10 99/35/7017    Metabolic Disorder Labs: Lab Results  Component Value Date   HGBA1C 5.2 01/28/2022   MPG 102.54 01/28/2022   MPG 96.8 10/09/2021   Lab Results  Component Value Date   PROLACTIN 32.5 (H) 05/30/2018   Lab Results  Component Value Date   CHOL 161 12/13/2021   TRIG 81 12/13/2021   HDL 75 12/13/2021   CHOLHDL 2.1 12/13/2021   VLDL 16 12/13/2021   LDLCALC 70 12/13/2021   LDLCALC 96 10/09/2021    Physical Findings: AIMS: Facial and Oral Movements Muscles of Facial Expression: None, normal Lips and Perioral Area: None, normal Jaw: None, normal Tongue: None, normal,Extremity Movements Upper (arms, wrists, hands, fingers): None, normal Lower (legs, knees, ankles, toes): None, normal, Trunk Movements Neck, shoulders, hips: None, normal, Overall Severity Severity of abnormal movements (highest score from questions above): None, normal Incapacitation due to abnormal movements: None, normal Patient's awareness of abnormal movements (rate only patient's report): No Awareness, Dental Status Current problems with teeth and/or dentures?: No Does patient usually wear dentures?: No  CIWA:    COWS:     Musculoskeletal: Strength & Muscle Tone: within normal limits Gait & Station:  normal Patient leans: N/A  Psychiatric Specialty Exam:  Presentation  General Appearance: Disheveled  Eye Contact:Minimal  Speech:Slow  Speech Volume:Decreased  Handedness:Right   Mood and Affect  Mood:Anxious  Affect:Constricted   Thought Process  Thought Processes:Coherent  Descriptions of Associations:Intact  Orientation:Full (Time, Place and Person)  Thought Content:Logical  History of Schizophrenia/Schizoaffective disorder:Yes  Duration of Psychotic Symptoms:Greater than six  months  Hallucinations:Hallucinations: Auditory  Ideas of Reference:None  Suicidal Thoughts:Suicidal Thoughts: No  Homicidal Thoughts:Homicidal Thoughts: No   Sensorium  Memory:Immediate Fair; Recent Fair; Remote [place]  Insight:Fair   Executive Functions  Concentration:Poor  Attention Span:Poor  [place]  Language:Good   Psychomotor Activity  Psychomotor Activity:Psychomotor Activity: Normal   Assets  Assets:Communication Skills; Desire for Improvement; Leisure Time   Sleep  Sleep:Sleep: Fair    Physical Exam: Physical Exam Vitals reviewed.  Constitutional:      General: He is not in acute distress.    Appearance: He is not ill-appearing or toxic-appearing.  Pulmonary:     Effort: Pulmonary effort is normal.  Neurological:     Mental Status: He is alert.     Motor: No weakness.     Gait: Gait normal.   Review of Systems  Constitutional:  Negative for chills and fever.  Cardiovascular:  Negative for chest pain and palpitations.  Neurological:  Negative for dizziness, tingling, tremors and headaches.  Psychiatric/Behavioral:  Positive for hallucinations and substance abuse. Negative for depression and suicidal ideas. The patient is nervous/anxious.   All other systems reviewed and are negative. Blood pressure 116/77, pulse 75, temperature 97.9 F (36.6 C), temperature source Oral, resp. rate 16, height 5'  8" (1.727 m), weight 69.9 kg, SpO2 100 %. Body mass index is 23.42 kg/m.   Treatment Plan Summary:  ASSESSMENT:  Diagnoses / Active Problems: -schizoaffective disorder, bipolar type   PLAN: Safety and Monitoring:  -- Involuntary admission to inpatient psychiatric unit for safety, stabilization and treatment  -- Daily contact with patient to assess and evaluate symptoms and progress in treatment  -- Patient's case to be discussed in multi-disciplinary team meeting  -- Observation Level : q15 minute checks  -- Vital signs:  q12 hours  -- Precautions: suicide, elopement, and assault  2. Psychiatric Diagnoses and Treatment:    -Continue Abilify 5 mg po daily for Schizoaffective disorder. -Incrase Risperdal  from 1 mg po bid -> to 1 mg in the morning and 1.5 mg qhs - for Schizoaffective disorder. -Continue Benztropine 0.5 mg po bid for prevention of eps. -Continue Propranolol 10 mg po bid for anxiety.   Agitation protocols. -Continue Zyprexa zydis 10 mg po Q 8 hrs prn. & -Lorazepam 1 mg po prn x 1 dose.  & Geodon 20 mg po IM x 1 dose.   Other prns. Acetaminophen 650 mg po Q 6 hrs prn for fever/pain.  MOM 30 ml po Q daily prn for constipation. Mylanta 30 ml po Q 4 hrs prn for indigestion.   3. Treat health problems as indicated.  4. Develop treatment plan to decrease risk of relapse upon discharge and the need for readmission.  5. Psycho-social education regarding relapse prevention and self care.  6. Health care follow up as needed for medical problems.  7. Review, reconcile, and reinstate any pertinent home medications for other health issues where appropriate. 8. Call for consults with hospitalist for any additional specialty patient care services as needed.    4. Discharge Planning:   -- Social work and case management to assist with discharge planning and identification of hospital follow-up needs prior to discharge  -- Estimated LOS: 5-7 days  -- Discharge Concerns:  Need to establish a safety plan; Medication compliance and effectiveness  -- Discharge Goals: Return home with outpatient referrals for mental health follow-up including medication management/psychotherapy   Christoper Allegra, MD 01/31/2022, 9:54 PM  Total  Time Spent in Direct Patient Care:  I personally spent 30 minutes on the unit in direct patient care. The direct patient care time included face-to-face time with the patient, reviewing the patient's chart, communicating with other professionals, and coordinating care. Greater than 50% of this time was spent in counseling or coordinating care with the patient regarding goals of hospitalization, psycho-education, and discharge planning needs.   Janine Limbo, MD Psychiatrist

## 2022-01-31 NOTE — Group Note (Signed)
Recreation Therapy Group Note   Group Topic:Personal Development  Group Date: 01/31/2022 Start Time: 1000 End Time: 1050 Facilitators: Victorino Sparrow, LRT,CTRS Location: [address] Dayroom   Goal Area(s) Addresses:  Patient will use appropriate interactions in play with peers.   Patient will follow directions on first prompt.  Group Description:  Anxiety.  LRT and patients discussed what anxiety is and how it affects each person.  Patients were then given a worksheet to identify triggers, physical symptoms, thoughts and coping skills for anxiety.    Affect/Mood: Depressed   Participation Level: Active   Participation Quality: Independent   Behavior: Appropriate   Speech/Thought Process: Relevant   Insight: Good   Judgement: Good   Modes of Intervention: Music and Worksheet   Patient Response to Interventions:  Engaged   Education Outcome:  Acknowledges education and In group clarification offered    Clinical Observations/Individualized Feedback: Pt was quiet but engaged when prompted. Pt stated he was feeling anxious about where he is going when he leaves here.  Pt stated the music was helping his anxiety.  Pt identified triggers as worrying, depression and lack of sleep.  Physical symptoms were taking pills, thoughts of harming self and thoughts of harming others.  Thoughts pt has are of harming self/others and going to sleep and not waking up.  Pt copes by taking medicine and talking to nurse.    Plan: Continue to engage patient in RT group sessions 2-3x/week.   Victorino Sparrow, LRT,CTRS  01/31/2022 11:44 AM

## 2022-01-31 NOTE — Progress Notes (Signed)
°   01/31/22 0500  Sleep  Number of Hours 7.5

## 2022-01-31 NOTE — Plan of Care (Signed)
°  Problem: Activity: Goal: Interest or engagement in activities will improve Outcome: Progressing   Problem: Coping: Goal: Ability to verbalize frustrations and anger appropriately will improve Outcome: Progressing   Problem: Coping: Goal: Ability to demonstrate self-control will improve Outcome: Progressing   Problem: Safety: Goal: Periods of time without injury will increase Outcome: Progressing   Problem: Activity: Goal: Will verbalize the importance of balancing activity with adequate rest periods Outcome: Progressing

## 2022-01-31 NOTE — [facility] Counselor (Signed)
CSW sent referral to [facility] for ACTT services.   Toney Reil, [place] Worker [employer]

## 2022-01-31 NOTE — Progress Notes (Signed)
Pt was encouraged but didn't attend therapeutic relaxation group. ?

## 2022-02-01 MED ORDER — ZIPRASIDONE MESYLATE 20 MG IM SOLR: 20.0000 mg | INTRAMUSCULAR | Status: DC | PRN

## 2022-02-01 MED ORDER — RISPERIDONE 1 MG PO TABS
1.5000 mg | ORAL_TABLET | Freq: Every day | ORAL | Status: DC
  Administered 2022-02-01: 1.5 mg via ORAL
  Filled 2022-02-01 (×3): qty 1

## 2022-02-01 MED ORDER — RISPERIDONE 1 MG PO TABS
1.5000 mg | ORAL_TABLET | Freq: Every day | ORAL | Status: DC
  Administered 2022-02-02 – 2022-02-03 (×2): 1.5 mg via ORAL
  Filled 2022-02-01 (×5): qty 1

## 2022-02-01 MED ORDER — OLANZAPINE 5 MG PO TBDP
5.0000 mg | ORAL_TABLET | Freq: Three times a day (TID) | ORAL | Status: DC | PRN
  Administered 2022-02-01: 5 mg via ORAL

## 2022-02-01 MED ORDER — OLANZAPINE 5 MG PO TBDP
5.0000 mg | ORAL_TABLET | Freq: Three times a day (TID) | ORAL | Status: DC | PRN
  Filled 2022-02-01: qty 1

## 2022-02-01 MED ORDER — OLANZAPINE 5 MG PO TABS
5.0000 mg | ORAL_TABLET | Freq: Every day | ORAL | Status: DC
  Administered 2022-02-01 – 2022-02-07 (×7): 5 mg via ORAL
  Filled 2022-02-01 (×8): qty 1

## 2022-02-01 MED ORDER — RISPERIDONE 2 MG PO TABS
2.0000 mg | ORAL_TABLET | Freq: Every day | ORAL | Status: DC
  Filled 2022-02-01 (×2): qty 1

## 2022-02-01 MED ORDER — LORAZEPAM 1 MG PO TABS
1.0000 mg | ORAL_TABLET | ORAL | Status: AC | PRN
  Administered 2022-02-02: 1 mg via ORAL
  Filled 2022-02-01: qty 1

## 2022-02-01 NOTE — Progress Notes (Addendum)
[facility] MD Progress Note  02/01/2022 12:18 PM Tyler White  MRN:  [id number] Subjective:    This is one of several psychiatric admissions in this [facility] for this 26 year old AA male with hx of Schizoaffective disorder & cocaine use disorder. He was admitted for command auditory hallucinations to harm himself and other, and had recently cut himself with a knife.   On exam today, the pt reports that AH are louder, more bothersome, and talling him to harm himself. He reports AH are worse when he is trying to concentrate and accomplish a task. He reports VH are unchanged and bothersome.  He reports mood is anxious and depressed. He reports that anxiety level is worse than yesterday. Denies that restlessness is worse. Denies SI and denies HI. Appetite is stable. Concentration is poor, affected by AH. Denies s/e to current psych meds.  Pt agreeable for zyprexa prn now. Pt interviewed about 10-15 minutes later and reports that zyprexa is calming and AH are less; and pt is agreeable to start zyprexa dose at bedtime and to increase risperdal.    Principal Problem: Schizoaffective disorder, bipolar type ([place]) Diagnosis: Principal Problem:   Schizoaffective disorder, bipolar type ([place]) Active Problems:   Schizoaffective disorder ([place])  Total Time spent with patient: 15 minutes  Past Psychiatric History: see H&P  Past Medical History:  Past Medical History:  Diagnosis Date   Hernia, inguinal, right    Inguinal hernia    right   Psychiatric illness    Schizophrenia ([place])    History reviewed. No pertinent surgical history. Family History:  Family History  Problem Relation Age of Onset   Psychiatric Illness Mother    Hypertension Sister    Family Psychiatric  History: see H&P Social History:  Social History   Substance and Sexual Activity  Alcohol Use Not Currently     Social History   Substance and Sexual Activity  Drug Use Not Currently   Types: Marijuana, Cocaine   Comment:  denies currently    Social History   Socioeconomic History   Marital status: Single    Spouse name: Not on file   Number of children: Not on file   Years of education: 10   Highest education level: Not on file  Occupational History   Not on file  Tobacco Use   Smoking status: Every Day    Packs/day: 0.50    Years: 5.00    Pack years: 2.50    Types: Cigarettes   Smokeless tobacco: Never  Vaping Use   Vaping Use: Never used  Substance and Sexual Activity   Alcohol use: Not Currently   Drug use: Not Currently    Types: Marijuana, Cocaine    Comment: denies currently   Sexual activity: Yes    Birth control/protection: None  Other Topics Concern   Not on file  Social History Narrative   ** Merged History Encounter **       ** Merged History Encounter **       Social Determinants of Health   Financial Resource Strain: Not on file  Food Insecurity: Not on file  Transportation Needs: Not on file  Physical Activity: Not on file  Stress: Not on file  Social Connections: Not on file   Additional Social History:                         Sleep: Fair  Appetite:  Fair  Current Medications: Current Facility-Administered Medications  Medication  Dose Route Frequency Provider Last Rate Last Admin   acetaminophen (TYLENOL) tablet 650 mg  650 mg Oral Q6H PRN Lindell Spar I, NP   650 mg at 02/01/22 1137   alum & mag hydroxide-simeth (MAALOX/MYLANTA) 200-200-20 MG/5ML suspension 30 mL  30 mL Oral Q4H PRN Lindell Spar I, NP       ARIPiprazole (ABILIFY) tablet 5 mg  5 mg Oral Daily Lindon Romp A, NP   5 mg at 02/01/22 4765   benztropine (COGENTIN) tablet 0.5 mg  0.5 mg Oral BID Lindon Romp A, NP   0.5 mg at 02/01/22 4650   hydrOXYzine (ATARAX) tablet 25 mg  25 mg Oral TID PRN Prescilla Sours, PA-C   25 mg at 02/01/22 1126   OLANZapine zydis (ZYPREXA) disintegrating tablet 5 mg  5 mg Oral Q8H PRN Jaydin Jalomo, Ovid Curd, MD       And   LORazepam (ATIVAN) tablet 1 mg  1 mg  Oral PRN Theophil Thivierge, Ovid Curd, MD       And   ziprasidone (GEODON) injection 20 mg  20 mg Intramuscular PRN Gwendalyn Mcgonagle, Ovid Curd, MD       magnesium hydroxide (MILK OF MAGNESIA) suspension 30 mL  30 mL Oral Daily PRN Nwoko, Herbert Pun I, NP       nicotine (NICODERM CQ - dosed in mg/24 hours) patch 21 mg  21 mg Transdermal Q0600 Lindell Spar I, NP       OLANZapine (ZYPREXA) tablet 5 mg  5 mg Oral QHS Lasundra Hascall, MD       OLANZapine zydis (ZYPREXA) disintegrating tablet 5 mg  5 mg Oral TID PRN Janine Limbo, MD   5 mg at 02/01/22 1137   propranolol (INDERAL) tablet 10 mg  10 mg Oral BID Lindon Romp A, NP   10 mg at 02/01/22 0835   [START ON 02/02/2022] risperiDONE (RISPERDAL) tablet 1.5 mg  1.5 mg Oral Daily Lucianne Smestad, MD       risperiDONE (RISPERDAL) tablet 1.5 mg  1.5 mg Oral QHS Gerell Fortson, Ovid Curd, MD       traZODone (DESYREL) tablet 50 mg  50 mg Oral QHS PRN Prescilla Sours, PA-C   50 mg at 01/31/22 2100    Lab Results: No results found for this or any previous visit (from the past 48 hour(s)).  Blood Alcohol level:  Lab Results  Component Value Date   ETH <10 01/28/2022   ETH <10 35/46/5681    Metabolic Disorder Labs: Lab Results  Component Value Date   HGBA1C 5.2 01/28/2022   MPG 102.54 01/28/2022   MPG 96.8 10/09/2021   Lab Results  Component Value Date   PROLACTIN 32.5 (H) 05/30/2018   Lab Results  Component Value Date   CHOL 161 12/13/2021   TRIG 81 12/13/2021   HDL 75 12/13/2021   CHOLHDL 2.1 12/13/2021   VLDL 16 12/13/2021   LDLCALC 70 12/13/2021   LDLCALC 96 10/09/2021    Physical Findings: AIMS: Facial and Oral Movements Muscles of Facial Expression: None, normal Lips and Perioral Area: None, normal Jaw: None, normal Tongue: None, normal,Extremity Movements Upper (arms, wrists, hands, fingers): None, normal Lower (legs, knees, ankles, toes): None, normal, Trunk Movements Neck, shoulders, hips: None, normal, Overall Severity Severity of  abnormal movements (highest score from questions above): None, normal Incapacitation due to abnormal movements: None, normal Patient's awareness of abnormal movements (rate only patient's report): No Awareness, Dental Status Current problems with teeth and/or dentures?: No Does patient usually wear dentures?: No  CIWA:  COWS:     Musculoskeletal: Strength & Muscle Tone: within normal limits Gait & Station: normal Patient leans: N/A  Psychiatric Specialty Exam:  Presentation  General Appearance: Disheveled  Eye Contact:Minimal  Speech:Slow  Speech Volume:Decreased  Handedness:Right   Mood and Affect  Mood:Anxious  Affect:Constricted   Thought Process  Thought Processes:Coherent  Descriptions of Associations:Intact  Orientation:Full (Time, Place and Person)  Thought Content:Logical  History of Schizophrenia/Schizoaffective disorder:Yes  Duration of Psychotic Symptoms:Greater than six months  Hallucinations:Hallucinations: Auditory +command AH to harm self  Pt reports VH as well   Ideas of Reference:None  Suicidal Thoughts:Suicidal Thoughts: No  Homicidal Thoughts:Homicidal Thoughts: No   Sensorium  Memory:Immediate Fair; Recent Fair; Remote [place]  Insight:Fair   Executive Functions  Concentration:Poor  Attention Span:Poor  [place]  Language:Good   Psychomotor Activity  Psychomotor Activity:Psychomotor Activity: Normal   Assets  Assets:Communication Skills; Desire for Improvement; Leisure Time   Sleep  Sleep:Sleep: Fair    Physical Exam: Physical Exam Vitals reviewed.  Constitutional:      General: He is not in acute distress.    Appearance: He is not ill-appearing or toxic-appearing.  Pulmonary:     Effort: Pulmonary effort is normal.  Neurological:     Mental Status: He is alert.     Motor: No weakness.     Gait: Gait normal.   Review of Systems  Constitutional:  Negative  for chills and fever.  Cardiovascular:  Negative for chest pain and palpitations.  Neurological:  Negative for dizziness, tingling, tremors and headaches.  Psychiatric/Behavioral:  Positive for hallucinations and substance abuse. Negative for depression and suicidal ideas. The patient is nervous/anxious.   All other systems reviewed and are negative.  Blood pressure 124/70, pulse 67, temperature 97.8 F (36.6 C), temperature source Oral, resp. rate 18, height 5\' 8"  (1.727 m), weight 69.9 kg, SpO2 100 %. Body mass index is 23.42 kg/m.   Treatment Plan Summary:  ASSESSMENT:  Diagnoses / Active Problems: -schizoaffective disorder, bipolar type   PLAN: Safety and Monitoring:  -- Involuntary admission to inpatient psychiatric unit for safety, stabilization and treatment  -- Daily contact with patient to assess and evaluate symptoms and progress in treatment  -- Patient's case to be discussed in multi-disciplinary team meeting  -- Observation Level : q15 minute checks  -- Vital signs:  q12 hours  -- Precautions: suicide, elopement, and assault  2. Psychiatric Diagnoses and Treatment:    -Continue Abilify 5 mg po daily for Schizoaffective disorder. -Incrase Risperdal to 1.5 mg bid - for Schizoaffective disorder. Plan for invega LAI. -Continue Benztropine 0.5 mg po bid for prevention of eps. -Continue Propranolol 10 mg po bid for anxiety.  -Start zyprexa 5 mg tid prn for psychosis (separate PRN order from agitation) -Start zyprexa 5 mg qhs for psychosis   Agitation protocols. -Continue Zyprexa zydis 10 mg po Q 8 hrs prn. & -Lorazepam 1 mg po prn x 1 dose.  & Geodon 20 mg po IM x 1 dose.   Other prns. Acetaminophen 650 mg po Q 6 hrs prn for fever/pain.  MOM 30 ml po Q daily prn for constipation. Mylanta 30 ml po Q 4 hrs prn for indigestion.   3. Treat health problems as indicated.  4. Develop treatment plan to decrease risk of relapse upon discharge and the need for  readmission.  5. Psycho-social education regarding relapse prevention and self care.  6. Health care follow up as needed for medical problems.  7. Review, reconcile, and reinstate any pertinent home medications for other health issues where appropriate. 8. Call for consults with hospitalist for any additional specialty patient care services as needed.    4. Discharge Planning:   -- Social work and case management to assist with discharge planning and identification of hospital follow-up needs prior to discharge  -- Estimated LOS: 5-7 days  -- Discharge Concerns: Need to establish a safety plan; Medication compliance and effectiveness  -- Discharge Goals: Return home with outpatient referrals for mental health follow-up including medication management/psychotherapy   Christoper Allegra, MD 02/01/2022, 12:18 PM  Total Time Spent in Direct Patient Care:  I personally spent 30 minutes on the unit in direct patient care. The direct patient care time included face-to-face time with the patient, reviewing the patient's chart, communicating with other professionals, and coordinating care. Greater than 50% of this time was spent in counseling or coordinating care with the patient regarding goals of hospitalization, psycho-education, and discharge planning needs.   Janine Limbo, MD Psychiatrist   EDIT TO PROGRESS NOTE 02-02-22 8:55AM: WILL STOP ABILIFY TEMPORARILY, UNTIL PT IS STABLE ON RISPERDAL, WITHOUT ZYPREXA. THEN RE-START ABILIFY NEXT WEEK.

## 2022-02-01 NOTE — Progress Notes (Signed)
Adult Psychoeducational Group Note  Date:  02/01/2022 Time:  8:19 PM  Group Topic/Focus:  Wrap-Up Group:   The focus of this group is to help patients review their daily goal of treatment and discuss progress on daily workbooks.  Participation Level:  Did Not Attend  Participation Quality:   Did Not Attend  Affect:   Did Not Attend  Cognitive:   Did Not Attend  Insight: None  Engagement in Group:   Did Not Attend  Modes of Intervention:   Did Not Attend  Additional Comments:  Pt was encouraged to attend wrap up group but did not attend.  Candy Sledge 02/01/2022, 8:19 PM

## 2022-02-01 NOTE — Group Note (Signed)
[facility] LCSW Group Therapy Note  Date/Time: 02/01/2022 at 11am  Type of Therapy and Topic:  Group Therapy:  Who Am I?  Self Esteem, Self-Actualization and Understanding Self.  Participation Level:  Minimal  Description of Group:    In this group patients will be asked to explore values, beliefs, truths, and morals as they relate to personal self.  Patients will be guided to discuss their thoughts, feelings, and behaviors related to what they identify as important to their true self. Patients will process together how values, beliefs and truths are connected to specific choices patients make every day. Each patient will be challenged to identify changes that they are motivated to make in order to improve self-esteem and self-actualization. This group will be process-oriented, with patients participating in exploration of their own experiences as well as giving and receiving support and challenge from other group members.  Therapeutic Goals: Patient will identify false beliefs that currently interfere with their self-esteem.  Patient will identify feelings, thought process, and behaviors related to self and will become aware of the uniqueness of themselves and of others.  Patient will be able to identify and verbalize values, morals, and beliefs as they relate to self. Patient will begin to learn how to build self-esteem/self-awareness by expressing what is important and unique to them personally.  Summary of Patient Progress: Patient came to group but participated minimally.  Patient did not participate in introductions or identifying values.  He left halfway through group.    Shantasia Hunnell, LCSW, LCAS Clincal Social Worker  [facility]              Therapeutic Modalities:   Cognitive Behavioral Therapy Solution Focused Therapy Motivational Interviewing Brief Therapy

## 2022-02-01 NOTE — Progress Notes (Signed)
°   02/01/22 0500  Sleep  Number of Hours 7.25

## 2022-02-01 NOTE — BHH Counselor (Signed)
CSW gave pt the number for the [facility] per his request.   Tyler White, [place] Worker [place]

## 2022-02-01 NOTE — Group Note (Signed)
Recreation Therapy Group Note   Group Topic:Team Building  Group Date: 02/01/2022 Start Time: 1000 End Time: 1030 Facilitators: Victorino Sparrow, LRT,CTRS Location: 500 Hall Dayroom   Goal Area(s) Addresses:  Patient will effectively work with peer towards shared goal.  Patient will identify skills used to make activity successful.  Patient will share challenges and verbalize solution-driven approaches used. Patient will identify how skills used during activity can be used to reach post d/c goals.    Group Description:  [employer]. Patients were provided the following materials: 5 drinking straws, 5 rubber bands, 5 paper clips, 2 index cards and 2 drinking cups. Using the provided materials patients were asked to build a launching mechanism to launch a ping pong ball across the room, approximately 10 feet. Patients were divided into teams of 3-5. Instructions required all materials be incorporated into the device, functionality of items left to the peer group's discretion.   Affect/Mood: N/A   Participation Level: Did not attend    Clinical Observations/Individualized Feedback:     Plan: Continue to engage patient in RT group sessions 2-3x/week.   Victorino Sparrow, LRT,CTRS 02/01/2022 12:01 PM

## 2022-02-01 NOTE — Progress Notes (Signed)
Pt presents with flat affect, logical, soft speech, minimal / guarded on interactions and is ambulatory in milieu with a steady gait. Reports he slept well last night with good appetite. Endorsed +AVH hallucinations when assessed. Per pt "I saw shadows yesterday but I always hear voices. It's louder though". Pt received PRNs Vistaril 25 mg and Zyprexa Zydis 5 mg both PO (see EMAR) for increased anxiety and +AH "The voices getting louder, it's irritating now". Pt exhibit insight int his mental health / substance abuse status this shift. Verbalized importance of being medication compliant during assessment. Per pt "I know my medications are working because I don't crave cocaine & weed when Im taking them". Visible in milieu at intervals during shift for scheduled groups. Pt noted with less pacing in hall when reassessed at 1220. Q 15 minutes safety checks maintained  without outburst or self harm gestures. All medications administered with verbal education and effects monitored. Emotional support provided to pt this shift. Pt encouraged to voice concerns. Pt remains medication compliant this shift without adverse drug reactions. Off unit for meals and returned without issue.

## 2022-02-01 NOTE — B[facility] Counselor (Signed)
Events affecting the discharge plan:  -MD approval  -coordinate ACTT intake Interventions by CSW:  -CSW attempted to complete SPE  -CSW followed up on ACTT referral  Emotional response of the patient/family to the plan of care: -Pt requested the number for the [facility].   Toney Reil, [place] Worker [employer]

## 2022-02-01 NOTE — Progress Notes (Signed)
°   02/01/22 2000  Psych Admission Type (Psych Patients Only)  Admission Status Involuntary  Psychosocial Assessment  Patient Complaints Anxiety  Eye Contact Brief  Facial Expression Flat  Affect Appropriate to circumstance  Speech Logical/coherent  Interaction Minimal;Guarded  Motor Activity Other (Comment) (WNL)  Appearance/Hygiene Unremarkable  Behavior Characteristics Cooperative  Mood Preoccupied  Aggressive Behavior  Effect No apparent injury  Thought Process  Coherency WDL  Content Preoccupation  Delusions None reported or observed  Perception WDL  Hallucination None reported or observed  Judgment Impaired  Confusion None  Danger to Self  Current suicidal ideation? Denies  Self-Injurious Behavior No self-injurious ideation or behavior indicators observed or expressed   Agreement Not to Harm Self Yes  Description of Agreement verbally contracts for safety  Danger to Others  Danger to Others None reported or observed

## 2022-02-02 MED ORDER — RISPERIDONE 2 MG PO TABS
2.0000 mg | ORAL_TABLET | Freq: Every day | ORAL | Status: DC
  Administered 2022-02-02 – 2022-02-07 (×6): 2 mg via ORAL
  Filled 2022-02-02 (×8): qty 1

## 2022-02-02 NOTE — Progress Notes (Signed)
Patient ID: Tyler White, male   DOB: 05-Jan-1996, 26 y.o.   MRN: [id number]   Pt c/o of anxiety. Atarax 25 mg, PRN given.

## 2022-02-02 NOTE — Progress Notes (Addendum)
[facility] MD Progress Note  02/02/2022 4:46 PM Tyler White  MRN:  [id number]  Subjective:  Tyler White reports: "I'm doing alright. I'm just scared of the injection".   Daily Notes, 02/02/2022: Pt with flat affect and depressed mood, attention to personal hygiene and grooming is fair, eye contact is poor, speech is clear & coherent. Thought contents are organized and logical, and pt currently denies SI/HI. He however reports +AVH, and states that the last time he heard voices was earlier in the morning today and "they were saying a lot, but they did not scare me". Pt reports that the last time he had visual hallucinations was yesterday, and states that he saw a dark circle floating around his room. Pt denies paranoia. There is no evidence of delusional thoughts.     Pt reports that his sleep quality last night was good, and that his appetite is good. He denies being in any physical pain, and denies having any side effects to his current medications.  Pt is currently on Risperdal 1.5 mg daily, and goal is to have pt on a LAI Invega. Will increase Risperdal to 2 mg nightly effective tonight (02/02/22). Abilify discontinued for now. Will continue Zyprexa 5 mg nightly.   PHI: This is one of several psychiatric admissions in this [facility] for this 26 year old AA male with hx of Schizoaffective disorder & cocaine use disorder. He was admitted for command auditory hallucinations to harm himself and other, and had recently cut himself with a knife.   Principal Problem: Schizoaffective disorder, bipolar type ([place]) Diagnosis: Principal Problem:   Schizoaffective disorder, bipolar type ([place]) Active Problems:   Schizoaffective disorder ([place])  Total Time spent with patient: 15 minutes  Past Psychiatric History: see H&P  Past Medical History:  Past Medical History:  Diagnosis Date   Hernia, inguinal, right    Inguinal hernia    right   Psychiatric illness    Schizophrenia ([place])    History reviewed. No pertinent  surgical history. Family History:  Family History  Problem Relation Age of Onset   Psychiatric Illness Mother    Hypertension Sister    Family Psychiatric  History: see H&P Social History:  Social History   Substance and Sexual Activity  Alcohol Use Not Currently     Social History   Substance and Sexual Activity  Drug Use Not Currently   Types: Marijuana, Cocaine   Comment: denies currently    Social History   Socioeconomic History   Marital status: Single    Spouse name: Not on file   Number of children: Not on file   Years of education: 10   Highest education level: Not on file  Occupational History   Not on file  Tobacco Use   Smoking status: Every Day    Packs/day: 0.50    Years: 5.00    Pack years: 2.50    Types: Cigarettes   Smokeless tobacco: Never  Vaping Use   Vaping Use: Never used  Substance and Sexual Activity   Alcohol use: Not Currently   Drug use: Not Currently    Types: Marijuana, Cocaine    Comment: denies currently   Sexual activity: Yes    Birth control/protection: None  Other Topics Concern   Not on file  Social History Narrative   ** Merged History Encounter **       ** Merged History Encounter **       Social Determinants of Health   Financial Resource Strain: Not on file  Food Insecurity: Not on file  Transportation Needs: Not on file  Physical Activity: Not on file  Stress: Not on file  Social Connections: Not on file   Additional Social History:     sleep: Fair  Appetite:  Fair  Current Medications: Current Facility-Administered Medications  Medication Dose Route Frequency Provider Last Rate Last Admin   acetaminophen (TYLENOL) tablet 650 mg  650 mg Oral Q6H PRN Lindell Spar I, NP   650 mg at 02/01/22 1137   alum & mag hydroxide-simeth (MAALOX/MYLANTA) 200-200-20 MG/5ML suspension 30 mL  30 mL Oral Q4H PRN Lindell Spar I, NP       benztropine (COGENTIN) tablet 0.5 mg  0.5 mg Oral BID Lindon Romp A, NP   0.5 mg at  02/02/22 1625   hydrOXYzine (ATARAX) tablet 25 mg  25 mg Oral TID PRN Prescilla Sours, PA-C   25 mg at 02/02/22 0810   magnesium hydroxide (MILK OF MAGNESIA) suspension 30 mL  30 mL Oral Daily PRN Lindell Spar I, NP       nicotine (NICODERM CQ - dosed in mg/24 hours) patch 21 mg  21 mg Transdermal Q0600 Lindell Spar I, NP       OLANZapine (ZYPREXA) tablet 5 mg  5 mg Oral QHS Massengill, Nathan, MD   5 mg at 02/01/22 2040   OLANZapine zydis (ZYPREXA) disintegrating tablet 5 mg  5 mg Oral TID PRN Janine Limbo, MD   5 mg at 02/01/22 1137   OLANZapine zydis (ZYPREXA) disintegrating tablet 5 mg  5 mg Oral Q8H PRN Massengill, Ovid Curd, MD       And   ziprasidone (GEODON) injection 20 mg  20 mg Intramuscular PRN Massengill, Ovid Curd, MD       propranolol (INDERAL) tablet 10 mg  10 mg Oral BID Lindon Romp A, NP   10 mg at 02/02/22 8768   risperiDONE (RISPERDAL) tablet 1.5 mg  1.5 mg Oral Daily Massengill, Nathan, MD   1.5 mg at 02/02/22 1157   risperiDONE (RISPERDAL) tablet 1.5 mg  1.5 mg Oral QHS Massengill, Nathan, MD   1.5 mg at 02/01/22 2040   traZODone (DESYREL) tablet 50 mg  50 mg Oral QHS PRN Prescilla Sours, PA-C   50 mg at 02/01/22 2040    Lab Results: No results found for this or any previous visit (from the past 48 hour(s)).  Blood Alcohol level:  Lab Results  Component Value Date   ETH <10 01/28/2022   ETH <10 26/20/3559    Metabolic Disorder Labs: Lab Results  Component Value Date   HGBA1C 5.2 01/28/2022   MPG 102.54 01/28/2022   MPG 96.8 10/09/2021   Lab Results  Component Value Date   PROLACTIN 32.5 (H) 05/30/2018   Lab Results  Component Value Date   CHOL 161 12/13/2021   TRIG 81 12/13/2021   HDL 75 12/13/2021   CHOLHDL 2.1 12/13/2021   VLDL 16 12/13/2021   LDLCALC 70 12/13/2021   LDLCALC 96 10/09/2021   Physical Findings: AIMS: Facial and Oral Movements Muscles of Facial Expression: None, normal Lips and Perioral Area: None, normal Jaw: None, normal Tongue:  None, normal,Extremity Movements Upper (arms, wrists, hands, fingers): None, normal Lower (legs, knees, ankles, toes): None, normal, Trunk Movements Neck, shoulders, hips: None, normal, Overall Severity Severity of abnormal movements (highest score from questions above): None, normal Incapacitation due to abnormal movements: None, normal Patient's awareness of abnormal movements (rate only patient's report): No Awareness, Dental Status Current problems with teeth  and/or dentures?: No Does patient usually wear dentures?: No  CIWA:  0 COWS:  0 AIMS: 0  Musculoskeletal: Strength & Muscle Tone: within normal limits Gait & Station: normal Patient leans: N/A  Psychiatric Specialty Exam:  Presentation  General Appearance: Appropriate for Environment; Fairly Groomed  Eye Contact:Fair  Speech:Clear and Coherent  Speech Volume:Decreased  Handedness:Right  Mood and Affect  Mood:Depressed  Affect:Flat  Thought Process  Thought Processes:Linear  Descriptions of Associations:Intact  Orientation:Full (Time, Place and Person)  Thought Content:Has residual AVH but no delusions noted  History of Schizophrenia/Schizoaffective disorder:Yes  Duration of Psychotic Symptoms:Greater than six months  Hallucinations:Hallucinations: Auditory; Visual  Ideas of Reference:None  Suicidal Thoughts:Suicidal Thoughts: No  Homicidal Thoughts:Homicidal Thoughts: No  Sensorium  Memory:Immediate Good  Judgment:Fair  Insight:Poor  Executive Functions  Concentration:Fair  Attention Span:Fair  [place]  Psychomotor Activity  Psychomotor Activity:Psychomotor Activity: Normal  Assets  Assets:Communication Skills  Physical Exam Vitals reviewed.  Constitutional:      General: He is not in acute distress.    Appearance: He is not ill-appearing or toxic-appearing.  HENT:     Head: Normocephalic.     Nose: Nose normal.  Pulmonary:      Effort: Pulmonary effort is normal.  Musculoskeletal:        General: Normal range of motion.     Cervical back: Normal range of motion. No rigidity.  Skin:    General: Skin is warm.  Neurological:     Mental Status: He is alert and oriented to person, place, and time.     Sensory: No sensory deficit.     Motor: No weakness.     Coordination: Coordination normal.     Gait: Gait normal.  Psychiatric:        Behavior: Behavior normal.        Thought Content: Thought content normal.   Review of Systems  Constitutional:  Negative for chills and fever.  HENT: Negative.    Eyes: Negative.   Respiratory: Negative.  Negative for cough.   Cardiovascular:  Negative for chest pain and palpitations.  Gastrointestinal: Negative.  Negative for heartburn.  Skin: Negative.   Neurological:  Negative for dizziness, tingling, tremors and headaches.  Psychiatric/Behavioral:  Positive for hallucinations and substance abuse. Negative for depression and suicidal ideas. The patient is nervous/anxious. The patient does not have insomnia.   All other systems reviewed and are negative.  Blood pressure 137/77, pulse 62, temperature (!) 97.5 F (36.4 C), temperature source Oral, resp. rate 18, height 5\' 8"  (1.727 m), weight 69.9 kg, SpO2 100 %. Body mass index is 23.42 kg/m.   Treatment Plan Summary:  ASSESSMENT:  Diagnoses / Active Problems: -schizoaffective disorder, bipolar type   PLAN: Safety and Monitoring:  -- Involuntary admission to inpatient psychiatric unit for safety, stabilization and treatment  -- Daily contact with patient to assess and evaluate symptoms and progress in treatment  -- Patient's case to be discussed in multi-disciplinary team meeting  -- Observation Level : q15 minute checks  -- Vital signs:  q12 hours  -- Precautions: suicide, elopement, and assault  2. Psychiatric Diagnoses and Treatment:    -Discontinue Abilify 5 mg po daily for Schizoaffective disorder while on  Risperdal and Zyprexa -Increase Risperdal to 1.5 mg qam and 2mg  qhs - for Schizoaffective disorder. Plan for invega LAI. -Continue Benztropine 0.5 mg po bid for prevention of eps. -Continue Propranolol 10 mg po bid for anxiety.  -Continue zyprexa 5 mg  tid prn for psychosis (separate PRN order from agitation) -Continue zyprexa 5 mg qhs for psychosis   Agitation protocols. -Continue Zyprexa zydis 10 mg po Q 8 hrs prn. & -Lorazepam 1 mg po prn x 1 dose.  & Geodon 20 mg po IM x 1 dose.   Other prns. Acetaminophen 650 mg po Q 6 hrs prn for fever/pain.  MOM 30 ml po Q daily prn for constipation. Mylanta 30 ml po Q 4 hrs prn for indigestion.   3. Treat health problems as indicated.  4. Develop treatment plan to decrease risk of relapse upon discharge and the need for readmission.  5. Psycho-social education regarding relapse prevention and self care.  6. Health care follow up as needed for medical problems.  7. Review, reconcile, and reinstate any pertinent home medications for other health issues where appropriate. 8. Call for consults with hospitalist for any additional specialty patient care services as needed.    4. Discharge Planning:   -- Social work and case management to assist with discharge planning and identification of hospital follow-up needs prior to discharge  -- Estimated LOS: 5-7 days  -- Discharge Concerns: Need to establish a safety plan; Medication compliance and effectiveness  -- Discharge Goals: Return home with outpatient referrals for mental health follow-up including medication management/psychotherapy  Nicholes Rough, NP 02/02/2022, 4:46 PM  Patient ID: Tyler White, male   DOB: April 26, 1996, 26 y.o.   MRN: [id number]

## 2022-02-02 NOTE — Group Note (Signed)
LCSW Group Therapy Note  No therapy group could be held today due to other needs on the unit that were prioritized.  The patient did not go without group, as another licensed group was held by an Therapist, sports.  Selmer Dominion, LCSW 10/20/2021 10:06 AM

## 2022-02-02 NOTE — Progress Notes (Signed)
°   02/02/22 0515  Sleep  Number of Hours 6.5

## 2022-02-02 NOTE — Progress Notes (Signed)
Patient ID: Tyler White, male   DOB: 1996-01-06, 26 y.o.   MRN: [id number]   Pt at med window anxious,fidgety,and shifting his weight from foot-to-foot. Pt given Ativan 1 mg, PRN.

## 2022-02-02 NOTE — Plan of Care (Addendum)
Pt is ambulatory with no issues on the unit. Pt denies SI/HI and denies any AVH today. Pt took his medications and denied any pain. Pt's goal today is to "take all my meds and go to group." Pt remains safe on and off the unit.   Problem: Coping: Goal: Ability to verbalize frustrations and anger appropriately will improve Outcome: Progressing   Problem: Coping: Goal: Ability to demonstrate self-control will improve Outcome: Progressing   Problem: Health Behavior/Discharge Planning: Goal: Compliance with treatment plan for underlying cause of condition will improve Outcome: Progressing   Problem: Safety: Goal: Periods of time without injury will increase Outcome: Progressing   Problem: Coping: Goal: Coping ability will improve Outcome: Progressing   Problem: Coping: Goal: Will verbalize feelings Outcome: Progressing

## 2022-02-02 NOTE — Progress Notes (Signed)
Adult Psychoeducational Group Note  Date:  02/02/2022 Time:  8:43 PM  Group Topic/Focus:  Wrap-Up Group:   The focus of this group is to help patients review their daily goal of treatment and discuss progress on daily workbooks.  Participation Level:  Minimal  Participation Quality:  Appropriate  Affect:  Appropriate  Cognitive:  Appropriate  Insight: Appropriate  Engagement in Group:  Limited  Modes of Intervention:  Discussion  Additional Comments:  Pt stated his goal for today was to focus on his treatment plan. Pt stated he accomplished his goal today. Pt stated he did not talked with his doctor but was able to speak with his social worker about his care today. Pt rated his overall day a 10. Pt stated he made no calls today. Pt stated he felt better about himself today. Pt stated he was able to attend all meals. Pt stated he took all medications provided today. Pt stated he attend all groups held today. Pt stated his appetite was pretty good today. Pt rated sleep last night was pretty good. Pt stated the goal tonight was to get some rest. Pt stated he had no physical pain tonight. Pt deny visual hallucinations and auditory issues tonight. Pt denies thoughts of harming himself or others. Pt stated he would alert staff if anything changed  Candy Sledge 02/02/2022, 8:43 PM

## 2022-02-03 MED ORDER — RISPERIDONE 0.5 MG PO TABS
0.5000 mg | ORAL_TABLET | Freq: Once | ORAL | Status: AC
  Administered 2022-02-03: 0.5 mg via ORAL
  Filled 2022-02-03: qty 1

## 2022-02-03 MED ORDER — RISPERIDONE 2 MG PO TABS
2.0000 mg | ORAL_TABLET | Freq: Every day | ORAL | Status: DC
  Administered 2022-02-04 – 2022-02-05 (×2): 2 mg via ORAL
  Filled 2022-02-03 (×5): qty 1

## 2022-02-03 NOTE — Progress Notes (Signed)
Adult Psychoeducational Group Note  Date:  02/03/2022 Time:  8:19 PM  Group Topic/Focus:  Wrap-Up Group:   The focus of this group is to help patients review their daily goal of treatment and discuss progress on daily workbooks.  Participation Level:  Active  Participation Quality:  Appropriate  Affect:  Appropriate  Cognitive:  Appropriate  Insight: Appropriate  Engagement in Group:  Engaged  Modes of Intervention:  Discussion  Additional Comments:  Pt stated his goal for today was to focus on his treatment plan, sleep and just stay in his room. Pt stated he accomplished his goals today. Pt stated he  talked with his doctor and was able to speak with his social worker about his care today. Pt rated his overall day a 10. Pt stated he made no calls today. Pt stated he felt better about himself today. Pt stated he was able to attend all meals. Pt stated he took all medications provided today. Pt stated he attend all groups held today. Pt stated his appetite was pretty good today. Pt rated sleep last night was pretty good. Pt stated the goal tonight was to get some rest. Pt stated he had no physical pain tonight. Pt deny visual hallucinations and auditory issues tonight. Pt denies thoughts of harming himself or others. Pt stated he would alert staff if anything changed    Candy Sledge 02/03/2022, 8:19 PM

## 2022-02-03 NOTE — Progress Notes (Signed)
D: Pt alert and oriented. Pt rates depression 0/10, hopelessness 0/10, and anxiety 0/10.  Pt reports energy level as good and concentration as being good.  Pt denies experiencing any pain at this time. Pt denies experiencing any SI/HI, or AVH at this time.   A: Scheduled medications administered to pt, per MD orders. Support and encouragement provided. Frequent verbal contact made. Routine safety checks conducted q15 minutes.   R: No adverse drug reactions noted. Pt verbally contracts for safety at this time. Pt complaint with medications and treatment plan. Pt interacts well with others on the unit. Pt remains safe at this time. Will continue to monitor.

## 2022-02-03 NOTE — Progress Notes (Signed)
°   02/03/22 2050  Psych Admission Type (Psych Patients Only)  Admission Status Involuntary  Psychosocial Assessment  Patient Complaints None  Eye Contact Fair;Brief  Facial Expression Flat;Blank  Affect Flat  Speech Logical/coherent  Interaction Minimal;Guarded;No initiation  Motor Activity Other (Comment) (WDL)  Appearance/Hygiene Unremarkable  Behavior Characteristics Cooperative;Guarded  Mood Pleasant;Sullen  Thought Process  Coherency Blocking  Content WDL  Delusions None reported or observed  Perception WDL  Hallucination None reported or observed  Judgment Limited  Confusion None  Danger to Self  Current suicidal ideation? Denies  Self-Injurious Behavior No self-injurious ideation or behavior indicators observed or expressed   Agreement Not to Harm Self Yes  Description of Agreement Verbal Contract  Danger to Others  Danger to Others None reported or observed

## 2022-02-03 NOTE — Progress Notes (Addendum)
[facility] MD Progress Note  02/03/2022 2:52 PM Tyler White  MRN:  [id number]  Subjective:  Tyler White reports: "I am okay. I just need to know when is discharge date."  Daily Notes, 02/03/2022: Pt with flat affect and depressed mood, attention to personal hygiene and grooming is fair, eye contact remains poor, speech is clear & coherent. Pt with some delayed responses. Pt currently denies SI/HI. Pt reports that his auditory hallucinations have decreased in intensity. He states "I still hear the voices, but I can deal with hit". Pt reports that the voices tell him not to go to sleep, and tell him that they want to help him. Pt reports that the last time that he heard voices was yesterday, and the last time that he saw something was yesterday when he saw dark circles in his room. Pt denies paranoia. There is no evidence of delusional thoughts.     Pt reports that his sleep quality last night was good, and that his appetite is fair. He denies being in any physical pain, and denies having any side effects to his current medications.  The goal is to have pt on a LAI Invega. Risperdal increased yesterday to 2 mg nightly. AM dose of Risperdal has also been increased to 2 mg in the mornings. Abilify was discontinued on 2/4. Will continue Zyprexa 5 mg nightly.   PHI: This is one of several psychiatric admissions in this [facility] for this 26 year old AA male with hx of Schizoaffective disorder & cocaine use disorder. He was admitted for command auditory hallucinations to harm himself and other, and had recently cut himself with a knife.   Principal Problem: Schizoaffective disorder, bipolar type ([place]) Diagnosis: Principal Problem:   Schizoaffective disorder, bipolar type ([place]) Active Problems:   Schizoaffective disorder ([place])  Total Time spent with patient: 15 minutes  Past Psychiatric History: see H&P  Past Medical History:  Past Medical History:  Diagnosis Date   Hernia, inguinal, right    Inguinal hernia     right   Psychiatric illness    Schizophrenia ([place])    History reviewed. No pertinent surgical history. Family History:  Family History  Problem Relation Age of Onset   Psychiatric Illness Mother    Hypertension Sister    Family Psychiatric  History: see H&P Social History:  Social History   Substance and Sexual Activity  Alcohol Use Not Currently     Social History   Substance and Sexual Activity  Drug Use Not Currently   Types: Marijuana, Cocaine   Comment: denies currently    Social History   Socioeconomic History   Marital status: Single    Spouse name: Not on file   Number of children: Not on file   Years of education: 10   Highest education level: Not on file  Occupational History   Not on file  Tobacco Use   Smoking status: Every Day    Packs/day: 0.50    Years: 5.00    Pack years: 2.50    Types: Cigarettes   Smokeless tobacco: Never  Vaping Use   Vaping Use: Never used  Substance and Sexual Activity   Alcohol use: Not Currently   Drug use: Not Currently    Types: Marijuana, Cocaine    Comment: denies currently   Sexual activity: Yes    Birth control/protection: None  Other Topics Concern   Not on file  Social History Narrative   ** Merged History Encounter **       **  Merged History Encounter **       Social Determinants of Health   Financial Resource Strain: Not on file  Food Insecurity: Not on file  Transportation Needs: Not on file  Physical Activity: Not on file  Stress: Not on file  Social Connections: Not on file   Additional Social History:     sleep: Fair  Appetite:  Fair  Current Medications: Current Facility-Administered Medications  Medication Dose Route Frequency Provider Last Rate Last Admin   acetaminophen (TYLENOL) tablet 650 mg  650 mg Oral Q6H PRN Lindell Spar I, NP   650 mg at 02/01/22 1137   alum & mag hydroxide-simeth (MAALOX/MYLANTA) 200-200-20 MG/5ML suspension 30 mL  30 mL Oral Q4H PRN Lindell Spar I, NP        benztropine (COGENTIN) tablet 0.5 mg  0.5 mg Oral BID Lindon Romp A, NP   0.5 mg at 02/03/22 2248   hydrOXYzine (ATARAX) tablet 25 mg  25 mg Oral TID PRN Prescilla Sours, PA-C   25 mg at 02/02/22 0810   magnesium hydroxide (MILK OF MAGNESIA) suspension 30 mL  30 mL Oral Daily PRN Lindell Spar I, NP       nicotine (NICODERM CQ - dosed in mg/24 hours) patch 21 mg  21 mg Transdermal Q0600 Lindell Spar I, NP       OLANZapine (ZYPREXA) tablet 5 mg  5 mg Oral QHS Massengill, Ovid Curd, MD   5 mg at 02/02/22 2051   OLANZapine zydis (ZYPREXA) disintegrating tablet 5 mg  5 mg Oral TID PRN Janine Limbo, MD   5 mg at 02/01/22 1137   OLANZapine zydis (ZYPREXA) disintegrating tablet 5 mg  5 mg Oral Q8H PRN Massengill, Ovid Curd, MD       And   ziprasidone (GEODON) injection 20 mg  20 mg Intramuscular PRN Massengill, Ovid Curd, MD       propranolol (INDERAL) tablet 10 mg  10 mg Oral BID Lindon Romp A, NP   10 mg at 02/03/22 2500   risperiDONE (RISPERDAL) tablet 0.5 mg  0.5 mg Oral Once Nicholes Rough, NP       risperiDONE (RISPERDAL) tablet 2 mg  2 mg Oral QHS Nkwenti, Doris, NP   2 mg at 02/02/22 2051   [START ON 02/04/2022] risperiDONE (RISPERDAL) tablet 2 mg  2 mg Oral Daily Nicholes Rough, NP       traZODone (DESYREL) tablet 50 mg  50 mg Oral QHS PRN [name] W, PA-C   50 mg at 02/01/22 2040    Lab Results: No results found for this or any previous visit (from the past 48 hour(s)).  Blood Alcohol level:  Lab Results  Component Value Date   ETH <10 01/28/2022   ETH <10 37/03/8888    Metabolic Disorder Labs: Lab Results  Component Value Date   HGBA1C 5.2 01/28/2022   MPG 102.54 01/28/2022   MPG 96.8 10/09/2021   Lab Results  Component Value Date   PROLACTIN 32.5 (H) 05/30/2018   Lab Results  Component Value Date   CHOL 161 12/13/2021   TRIG 81 12/13/2021   HDL 75 12/13/2021   CHOLHDL 2.1 12/13/2021   VLDL 16 12/13/2021   LDLCALC 70 12/13/2021   LDLCALC 96 10/09/2021   Physical  Findings: AIMS: Facial and Oral Movements Muscles of Facial Expression: None, normal Lips and Perioral Area: None, normal Jaw: None, normal Tongue: None, normal,Extremity Movements Upper (arms, wrists, hands, fingers): None, normal Lower (legs, knees, ankles, toes): None, normal, Trunk Movements  Neck, shoulders, hips: None, normal, Overall Severity Severity of abnormal movements (highest score from questions above): None, normal Incapacitation due to abnormal movements: None, normal Patient's awareness of abnormal movements (rate only patient's report): No Awareness, Dental Status Current problems with teeth and/or dentures?: No Does patient usually wear dentures?: No  CIWA:  0 COWS:  0 AIMS: 0  Musculoskeletal: Strength & Muscle Tone: within normal limits Gait & Station: normal Patient leans: N/A  Psychiatric Specialty Exam:  Presentation  General Appearance: Appropriate for Environment  Eye Contact:Fair  Speech:Clear and Coherent  Speech Volume:Normal  Handedness:Right  Mood and Affect  Mood: Aloof  Affect:guarded  Thought Process  Thought Processes:Linear but ruminative about discharge planning  Descriptions of Associations:Intact  Orientation:Full (Time, Place and Person)  Thought Content: Is not grossly responding to internal/external stimuli on exam but remains guarded and reports residual AVH; no delusions, ideas of reference or first rank symptoms endorsed today.Denies SI or HI  History of Schizophrenia/Schizoaffective disorder:Yes  Duration of Psychotic Symptoms:Greater than six months  Hallucinations:Hallucinations: Auditory; Visual Description of Auditory Hallucinations: random voices saying various things Description of Visual Hallucinations: circles  Ideas of Reference:None  Suicidal Thoughts:Suicidal Thoughts: No  Homicidal Thoughts:Homicidal Thoughts: No  Sensorium  Memory:Immediate Good; Recent  Good  Judgment:Fair  Insight:Shallow  Executive Functions  Concentration:Fair  Attention Span:Fair  Sisters  Psychomotor Activity  Psychomotor Activity:Psychomotor Activity: Normal  Assets  Assets:Communication Skills; Desire for Improvement  Sleep  Total time unrecorded  Physical Exam Vitals reviewed.  Constitutional:      General: He is not in acute distress.    Appearance: He is not ill-appearing or toxic-appearing.  HENT:     Head: Normocephalic.     Nose: Nose normal.  Pulmonary:     Effort: Pulmonary effort is normal.  Musculoskeletal:        General: Normal range of motion.     Cervical back: Normal range of motion. No rigidity.  Skin:    General: Skin is warm.  Neurological:     Mental Status: He is alert and oriented to person, place, and time.     Sensory: No sensory deficit.     Motor: No weakness.     Coordination: Coordination normal.     Gait: Gait normal.  Psychiatric:        Behavior: Behavior normal.        Thought Content: Thought content normal.   Review of Systems  Constitutional:  Negative for chills and fever.  HENT: Negative.    Eyes: Negative.   Respiratory: Negative.  Negative for cough.   Cardiovascular:  Negative for chest pain and palpitations.  Gastrointestinal: Negative.  Negative for heartburn.  Skin: Negative.   Neurological:  Negative for dizziness, tingling, tremors and headaches.  Psychiatric/Behavioral:  Positive for hallucinations and substance abuse. Negative for depression and suicidal ideas. The patient is nervous/anxious. The patient does not have insomnia.   All other systems reviewed and are negative.  Blood pressure 137/77, pulse 62, temperature (!) 97.5 F (36.4 C), temperature source Oral, resp. rate 18, height 5\' 8"  (1.727 m), weight 69.9 kg, SpO2 100 %. Body mass index is 23.42 kg/m.   Treatment Plan Summary:  ASSESSMENT:  Diagnoses / Active  Problems: -schizoaffective disorder, bipolar type   PLAN: Safety and Monitoring:  -- Involuntary admission to inpatient psychiatric unit for safety, stabilization and treatment  -- Daily contact with patient to assess and evaluate symptoms and progress in treatment  --  Patient's case to be discussed in multi-disciplinary team meeting  -- Observation Level : q15 minute checks  -- Vital signs:  q12 hours  -- Precautions: suicide, elopement, and assault  2. Psychiatric Diagnoses and Treatment:    -Discontinued Abilify 5 mg po daily for Schizoaffective disorder while on Risperdal and Zyprexa -Increase Risperdal to 2 mg qam and 2mg  qhs - for Schizoaffective disorder. Plan for invega LAI. -Continue Benztropine 0.5 mg po bid for prevention of eps. -Continue Propranolol 10 mg po bid for anxiety.  -Continue zyprexa 5 mg tid prn for psychosis (separate PRN order from agitation) -Continue zyprexa 5 mg qhs for psychosis   Agitation protocols. -Continue Zyprexa zydis 10 mg po Q 8 hrs prn. & -Lorazepam 1 mg po prn x 1 dose.  & Geodon 20 mg po IM x 1 dose.   Other prns. Acetaminophen 650 mg po Q 6 hrs prn for fever/pain.  MOM 30 ml po Q daily prn for constipation. Mylanta 30 ml po Q 4 hrs prn for indigestion.   3. Treat health problems as indicated.  4. Develop treatment plan to decrease risk of relapse upon discharge and the need for readmission.  5. Psycho-social education regarding relapse prevention and self care.  6. Health care follow up as needed for medical problems.  7. Review, reconcile, and reinstate any pertinent home medications for other health issues where appropriate. 8. Call for consults with hospitalist for any additional specialty patient care services as needed.    4. Discharge Planning:   -- Social work and case management to assist with discharge planning and identification of hospital follow-up needs prior to discharge  -- Estimated LOS: 5-7 days  -- Discharge  Concerns: Need to establish a safety plan; Medication compliance and effectiveness  -- Discharge Goals: Return home with outpatient referrals for mental health follow-up including medication management/psychotherapy  Nicholes Rough, NP 02/03/2022, 2:52 PM  Patient ID: Tyler White, male   DOB: 02/08/1996, 26 y.o.   MRN: [id number]

## 2022-02-03 NOTE — Group Note (Signed)
°  [facility] LCSW Group Therapy Note  Date/Time:  02/03/2022 10:00AM-11:00AM  Type of Therapy and Topic:  Group Therapy:  Ways to Love Myself and Take Care of Myself  Participation Level:  Did Not Attend   Description of Group This process group started with group leader playing a song entitled "Love Me More" to facilitate a discussion about the need to love and respect ourselves and prioritize taking care of ourselves, especially after hospital discharge.   There was a discussion about the need for self-love, and patients listed ways in which they are demonstrating self-love while in the hospital.  Patients were then asked to share how they plan to take care of themselves in a better manner when they get home from the hospital.  Group members shared ideas about making changes when they return home so that they can stay well and in recovery.    Therapeutic Goals Patient will listen and be able to relate to a song about prioritizing themselves through self-love Patient will participate in generating ideas about healthy self-care options when they return to the community Patients will be supportive of one another and receive said support from others   Summary of Patient Progress:  The patient was invited to group but did not attend.   Therapeutic Modalities Activity Motivational Interviewing Processing   Selmer Dominion, LCSW 02/03/2022, 12:00pm   My

## 2022-02-03 NOTE — [facility] Group Notes (Signed)
[place] Group Notes:  (Nursing/MHT/Case Management/Adjunct)  Date:  02/03/2022  Time:  12:08 PM  Type of Therapy:   Orientation/Goals group  Participation Level:  Minimal  Participation Quality:  Appropriate  Affect:  Appropriate  Cognitive:  Appropriate  Insight:  Appropriate  Engagement in Group:  Engaged and Improving  Modes of Intervention:  Discussion, Education, and Orientation  Summary of Progress/Problems: Pt goal for today is to sleep and just stay in his room.  Kristyn Obyrne J Alias Villagran 02/03/2022, 12:08 PM

## 2022-02-04 ENCOUNTER — Encounter (HOSPITAL_COMMUNITY): Payer: Self-pay

## 2022-02-04 MED ORDER — PROPRANOLOL HCL 20 MG PO TABS
20.0000 mg | ORAL_TABLET | Freq: Two times a day (BID) | ORAL | Status: DC
  Administered 2022-02-04 – 2022-02-08 (×7): 20 mg via ORAL
  Filled 2022-02-04 (×10): qty 1

## 2022-02-04 NOTE — Progress Notes (Signed)
[facility] MD Progress Note  02/04/2022 2:44 PM Alexandra Posadas  MRN:  [id number]  Subjective:  Newell Coral reports: "I'm okay. trying to get home. It may be they will send me to a shelter."  Daily Notes, 02/04/2022: Pt with flat affect and depressed mood, attention to personal hygiene and grooming is fair, eye contact is fair, speech is clear & coherent. Pt with some delayed responses. Pt currently denies SI/HI. Pt reported to the attending Physician that his auditory hallucinations have decreased in intensity, and stated to this writer that he no longer has AVH.  Pt denies paranoia. There is no evidence of delusional thoughts.     Pt reports that his sleep quality last night was "okay", and clarified that it was fair. He reports that his appetite is fair. He denies being in any physical pain, and denies having any side effects to his current medications.  The goal is to give pt the [facility] tomorrow. Risperdal is currently at 2 mg BID. Abilify was discontinued on 2/4. Pt currently on Zyprexa 5 mg nightly as well. Today, pt is observed to be restless, and repeatedly taps his feet to the floor. Unsure if this is akathisia or anxiety. Propranolol increased to 20 mg BID to manage restlessness/anxiety. Pt verbalized readiness for discharge today, and was educated that Invega LAI is planned to be administered tomorrow, and it would be necessary that he is monitored for a few more days for any possible side effects prior to discharge. Pt reported not having housing, and reported being open to going to a shelter. He reports that he has a younger brother in [place], but that he cannot stay there. He reports also that his father lives in [place], and that he can only visit there. He also states that his mother lives in [place]. Discharge planning is ongoing with social work.  PHI: This is one of several psychiatric admissions in this [facility] for this 26 year old AA male with hx of Schizoaffective disorder & cocaine use  disorder. He was admitted for command auditory hallucinations to harm himself and other, and had recently cut himself with a knife.   Principal Problem: Schizoaffective disorder, bipolar type ([place]) Diagnosis: Principal Problem:   Schizoaffective disorder, bipolar type ([place]) Active Problems:   Schizoaffective disorder ([place])  Total Time spent with patient: 15 minutes  Past Psychiatric History: see H&P  Past Medical History:  Past Medical History:  Diagnosis Date   Hernia, inguinal, right    Inguinal hernia    right   Psychiatric illness    Schizophrenia ([place])    History reviewed. No pertinent surgical history. Family History:  Family History  Problem Relation Age of Onset   Psychiatric Illness Mother    Hypertension Sister    Family Psychiatric  History: see H&P Social History:  Social History   Substance and Sexual Activity  Alcohol Use Not Currently     Social History   Substance and Sexual Activity  Drug Use Not Currently   Types: Marijuana, Cocaine   Comment: denies currently    Social History   Socioeconomic History   Marital status: Single    Spouse name: Not on file   Number of children: Not on file   Years of education: 10   Highest education level: Not on file  Occupational History   Not on file  Tobacco Use   Smoking status: Every Day    Packs/day: 0.50    Years: 5.00    Pack years: 2.50  Types: Cigarettes   Smokeless tobacco: Never  Vaping Use   Vaping Use: Never used  Substance and Sexual Activity   Alcohol use: Not Currently   Drug use: Not Currently    Types: Marijuana, Cocaine    Comment: denies currently   Sexual activity: Yes    Birth control/protection: None  Other Topics Concern   Not on file  Social History Narrative   ** Merged History Encounter **       ** Merged History Encounter **       Social Determinants of Radio broadcast assistant Strain: Not on file  Food Insecurity: Not on file  Transportation Needs: Not  on file  Physical Activity: Not on file  Stress: Not on file  Social Connections: Not on file   Additional Social History:     sleep: Fair  Appetite:  Fair  Current Medications: Current Facility-Administered Medications  Medication Dose Route Frequency Provider Last Rate Last Admin   acetaminophen (TYLENOL) tablet 650 mg  650 mg Oral Q6H PRN Lindell Spar I, NP   650 mg at 02/01/22 1137   alum & mag hydroxide-simeth (MAALOX/MYLANTA) 200-200-20 MG/5ML suspension 30 mL  30 mL Oral Q4H PRN Lindell Spar I, NP       benztropine (COGENTIN) tablet 0.5 mg  0.5 mg Oral BID Lindon Romp A, NP   0.5 mg at 02/04/22 0934   hydrOXYzine (ATARAX) tablet 25 mg  25 mg Oral TID PRN Prescilla Sours, PA-C   25 mg at 02/02/22 0810   magnesium hydroxide (MILK OF MAGNESIA) suspension 30 mL  30 mL Oral Daily PRN Nwoko, Herbert Pun I, NP       nicotine (NICODERM CQ - dosed in mg/24 hours) patch 21 mg  21 mg Transdermal Q0600 Lindell Spar I, NP       OLANZapine (ZYPREXA) tablet 5 mg  5 mg Oral QHS Massengill, Nathan, MD   5 mg at 02/03/22 2050   OLANZapine zydis (ZYPREXA) disintegrating tablet 5 mg  5 mg Oral TID PRN Janine Limbo, MD   5 mg at 02/01/22 1137   OLANZapine zydis (ZYPREXA) disintegrating tablet 5 mg  5 mg Oral Q8H PRN Massengill, Ovid Curd, MD       And   ziprasidone (GEODON) injection 20 mg  20 mg Intramuscular PRN Massengill, Ovid Curd, MD       propranolol (INDERAL) tablet 20 mg  20 mg Oral BID Massengill, Nathan, MD       risperiDONE (RISPERDAL) tablet 2 mg  2 mg Oral QHS Zahraa Bhargava, NP   2 mg at 02/03/22 2050   risperiDONE (RISPERDAL) tablet 2 mg  2 mg Oral Daily Coretha Creswell, NP   2 mg at 02/04/22 0934   traZODone (DESYREL) tablet 50 mg  50 mg Oral QHS PRN Prescilla Sours, PA-C   50 mg at 02/01/22 2040    Lab Results: No results found for this or any previous visit (from the past 48 hour(s)).  Blood Alcohol level:  Lab Results  Component Value Date   ETH <10 01/28/2022   ETH <10 48/54/6270     Metabolic Disorder Labs: Lab Results  Component Value Date   HGBA1C 5.2 01/28/2022   MPG 102.54 01/28/2022   MPG 96.8 10/09/2021   Lab Results  Component Value Date   PROLACTIN 32.5 (H) 05/30/2018   Lab Results  Component Value Date   CHOL 161 12/13/2021   TRIG 81 12/13/2021   HDL 75 12/13/2021   CHOLHDL 2.1  12/13/2021   VLDL 16 12/13/2021   LDLCALC 70 12/13/2021   LDLCALC 96 10/09/2021   Physical Findings: AIMS: Facial and Oral Movements Muscles of Facial Expression: None, normal Lips and Perioral Area: None, normal Jaw: None, normal Tongue: None, normal,Extremity Movements Upper (arms, wrists, hands, fingers): None, normal Lower (legs, knees, ankles, toes): None, normal, Trunk Movements Neck, shoulders, hips: None, normal, Overall Severity Severity of abnormal movements (highest score from questions above): None, normal Incapacitation due to abnormal movements: None, normal Patient's awareness of abnormal movements (rate only patient's report): No Awareness, Dental Status Current problems with teeth and/or dentures?: No Does patient usually wear dentures?: No  CIWA:  0 COWS:  0 AIMS: 0  Musculoskeletal: Strength & Muscle Tone: within normal limits Gait & Station: normal Patient leans: N/A  Psychiatric Specialty Exam:  Presentation  General Appearance: Appropriate for Environment; Fairly Groomed  Eye Contact:Fair  Speech:Clear and Coherent  Speech Volume:Normal  Handedness:Right  Mood and Affect  Mood: Aloof  Affect:guarded  Thought Process  Thought Processes:Linear but ruminative about discharge planning  Descriptions of Associations:Intact  Orientation:Full (Time, Place and Person)  Thought Content: Is not grossly responding to internal/external stimuli on exam but remains guarded and reports residual AVH; no delusions, ideas of reference or first rank symptoms endorsed today.Denies SI or HI  History of Schizophrenia/Schizoaffective  disorder:Yes  Duration of Psychotic Symptoms:Greater than six months  Hallucinations:Hallucinations: Auditory; Visual Description of Auditory Hallucinations: random voices saying various things Description of Visual Hallucinations: circles  Ideas of Reference:None  Suicidal Thoughts:Suicidal Thoughts: No  Homicidal Thoughts:Homicidal Thoughts: No  Sensorium  Memory:Immediate Good; Recent Good  Judgment:Fair  Insight:Shallow  Executive Functions  Concentration:Poor  Attention Span:Fair  [place]  Psychomotor Activity  Psychomotor Activity:Psychomotor Activity: Restlessness  Assets  Assets:Communication Skills  Sleep  Total time unrecorded  Physical Exam Vitals reviewed.  Constitutional:      General: He is not in acute distress.    Appearance: He is not ill-appearing or toxic-appearing.  HENT:     Head: Normocephalic.     Nose: Nose normal.  Pulmonary:     Effort: Pulmonary effort is normal.  Musculoskeletal:        General: Normal range of motion.     Cervical back: Normal range of motion. No rigidity.  Skin:    General: Skin is warm.  Neurological:     Mental Status: He is alert and oriented to person, place, and time.     Sensory: No sensory deficit.     Motor: No weakness.     Coordination: Coordination normal.     Gait: Gait normal.  Psychiatric:        Behavior: Behavior normal.        Thought Content: Thought content normal.   Review of Systems  Constitutional:  Negative for chills and fever.  HENT: Negative.    Eyes: Negative.   Respiratory: Negative.  Negative for cough.   Cardiovascular:  Negative for chest pain and palpitations.  Gastrointestinal: Negative.  Negative for heartburn.  Skin: Negative.   Neurological:  Negative for dizziness, tingling, tremors and headaches.  Psychiatric/Behavioral:  Positive for hallucinations and substance abuse. Negative for depression and suicidal ideas. The  patient is nervous/anxious. The patient does not have insomnia.   All other systems reviewed and are negative.  Blood pressure 102/89, pulse 88, temperature 97.9 F (36.6 C), temperature source Oral, resp. rate 16, height 5\' 8"  (1.727 m), weight 69.9 kg, SpO2 100 %.  Body mass index is 23.42 kg/m.   Treatment Plan Summary:  ASSESSMENT:  Diagnoses / Active Problems: -schizoaffective disorder, bipolar type   PLAN: Safety and Monitoring:  -- Involuntary admission to inpatient psychiatric unit for safety, stabilization and treatment  -- Daily contact with patient to assess and evaluate symptoms and progress in treatment  -- Patient's case to be discussed in multi-disciplinary team meeting  -- Observation Level : q15 minute checks  -- Vital signs:  q12 hours  -- Precautions: suicide, elopement, and assault  2. Psychiatric Diagnoses and Treatment:    -Discontinued Abilify 5 mg po daily for Schizoaffective disorder while on Risperdal and Zyprexa -Continue Risperdal to 2 mg qam and 2mg  qhs - for Schizoaffective disorder. Plan for invega LAI tomorrow. -Continue Benztropine 0.5 mg po bid for prevention of eps. -Increase Propranolol 20 mg po bid for anxiety/restlessness.  -Continue zyprexa 5 mg tid prn for psychosis (separate PRN order from agitation) -Continue zyprexa 5 mg qhs for psychosis   Agitation protocols. -Continue Zyprexa zydis 10 mg po Q 8 hrs prn. & -Lorazepam 1 mg po prn x 1 dose.  & Geodon 20 mg po IM x 1 dose.   Other prns. Acetaminophen 650 mg po Q 6 hrs prn for fever/pain.  MOM 30 ml po Q daily prn for constipation. Mylanta 30 ml po Q 4 hrs prn for indigestion.   3. Treat health problems as indicated.  4. Develop treatment plan to decrease risk of relapse upon discharge and the need for readmission.  5. Psycho-social education regarding relapse prevention and self care.  6. Health care follow up as needed for medical problems.  7. Review, reconcile, and reinstate  any pertinent home medications for other health issues where appropriate. 8. Call for consults with hospitalist for any additional specialty patient care services as needed.    4. Discharge Planning:   -- Social work and case management to assist with discharge planning and identification of hospital follow-up needs prior to discharge  -- Estimated LOS: 5-7 days  -- Discharge Concerns: Need to establish a safety plan; Medication compliance and effectiveness  -- Discharge Goals: Return home with outpatient referrals for mental health follow-up including medication management/psychotherapy  Nicholes Rough, NP 02/04/2022, 2:44 PM  Patient ID: Ezrah Panning, male   DOB: 02/15/1996, 26 y.o.   MRN: [id number]

## 2022-02-04 NOTE — BH IP Treatment Plan (Signed)
Interdisciplinary Treatment and Diagnostic Plan Update  02/04/2022 Time of Session: update Tyler White MRN: [id number]  Principal Diagnosis: Schizoaffective disorder, bipolar type [facility])  Secondary Diagnoses: Principal Problem:   Schizoaffective disorder, bipolar type ([place]) Active Problems:   Schizoaffective disorder ([place])   Current Medications:  Current Facility-Administered Medications  Medication Dose Route Frequency Provider Last Rate Last Admin   acetaminophen (TYLENOL) tablet 650 mg  650 mg Oral Q6H PRN Lindell Spar I, NP   650 mg at 02/01/22 1137   alum & mag hydroxide-simeth (MAALOX/MYLANTA) 200-200-20 MG/5ML suspension 30 mL  30 mL Oral Q4H PRN Lindell Spar I, NP       benztropine (COGENTIN) tablet 0.5 mg  0.5 mg Oral BID Lindon Romp A, NP   0.5 mg at 02/04/22 3244   hydrOXYzine (ATARAX) tablet 25 mg  25 mg Oral TID PRN Prescilla Sours, PA-C   25 mg at 02/02/22 0810   magnesium hydroxide (MILK OF MAGNESIA) suspension 30 mL  30 mL Oral Daily PRN Lindell Spar I, NP       nicotine (NICODERM CQ - dosed in mg/24 hours) patch 21 mg  21 mg Transdermal Q0600 Lindell Spar I, NP       OLANZapine (ZYPREXA) tablet 5 mg  5 mg Oral QHS Massengill, Ovid Curd, MD   5 mg at 02/03/22 2050   OLANZapine zydis (ZYPREXA) disintegrating tablet 5 mg  5 mg Oral TID PRN Janine Limbo, MD   5 mg at 02/01/22 1137   OLANZapine zydis (ZYPREXA) disintegrating tablet 5 mg  5 mg Oral Q8H PRN Massengill, Ovid Curd, MD       And   ziprasidone (GEODON) injection 20 mg  20 mg Intramuscular PRN Massengill, Ovid Curd, MD       propranolol (INDERAL) tablet 10 mg  10 mg Oral BID Lindon Romp A, NP   10 mg at 02/04/22 0934   risperiDONE (RISPERDAL) tablet 2 mg  2 mg Oral QHS Nkwenti, Doris, NP   2 mg at 02/03/22 2050   risperiDONE (RISPERDAL) tablet 2 mg  2 mg Oral Daily Nicholes Rough, NP   2 mg at 02/04/22 0934   traZODone (DESYREL) tablet 50 mg  50 mg Oral QHS PRN Prescilla Sours, PA-C   50 mg at 02/01/22 2040   PTA  Medications: Medications Prior to Admission  Medication Sig Dispense Refill Last Dose   ARIPiprazole (ABILIFY) 5 MG tablet Take 1 tablet (5 mg total) by mouth daily. (Patient not taking: Reported on 01/28/2022) 30 tablet 0    benztropine (COGENTIN) 0.5 MG tablet Take 1 tablet (0.5 mg total) by mouth 2 (two) times daily. (Patient not taking: Reported on 01/28/2022) 60 tablet 0    hydrOXYzine (ATARAX) 25 MG tablet Take 1 tablet (25 mg total) by mouth 3 (three) times daily as needed for anxiety. 30 tablet 0    propranolol (INDERAL) 10 MG tablet Take 1 tablet (10 mg total) by mouth 2 (two) times daily. (Patient not taking: Reported on 01/28/2022) 60 tablet 0    risperiDONE (RISPERDAL) 1 MG tablet Take 1 tablet (1 mg total) by mouth 2 (two) times daily.      traZODone (DESYREL) 50 MG tablet Take 1 tablet (50 mg total) by mouth at bedtime as needed for sleep.       Patient Stressors: Financial difficulties   Medication change or noncompliance    Patient Strengths: Average or above average intelligence  Motivation for treatment/growth   Treatment Modalities: Medication Management, Group therapy, Case management,  1 to 1 session with clinician, Psychoeducation, Recreational therapy.   Physician Treatment Plan for Primary Diagnosis: Schizoaffective disorder, bipolar type ([place]) Long Term Goal(s): Improvement in symptoms so as ready for discharge   Short Term Goals: Ability to identify and develop effective coping behaviors will improve Ability to maintain clinical measurements within normal limits will improve Compliance with prescribed medications will improve Ability to identify triggers associated with substance abuse/mental health issues will improve Ability to identify changes in lifestyle to reduce recurrence of condition will improve Ability to verbalize feelings will improve Ability to disclose and discuss suicidal ideas Ability to demonstrate self-control will improve  Medication  Management: Evaluate patient's response, side effects, and tolerance of medication regimen.  Therapeutic Interventions: 1 to 1 sessions, Unit Group sessions and Medication administration.  Evaluation of Outcomes: Progressing  Physician Treatment Plan for Secondary Diagnosis: Principal Problem:   Schizoaffective disorder, bipolar type ([place]) Active Problems:   Schizoaffective disorder ([place])  Long Term Goal(s): Improvement in symptoms so as ready for discharge   Short Term Goals: Ability to identify and develop effective coping behaviors will improve Ability to maintain clinical measurements within normal limits will improve Compliance with prescribed medications will improve Ability to identify triggers associated with substance abuse/mental health issues will improve Ability to identify changes in lifestyle to reduce recurrence of condition will improve Ability to verbalize feelings will improve Ability to disclose and discuss suicidal ideas Ability to demonstrate self-control will improve     Medication Management: Evaluate patient's response, side effects, and tolerance of medication regimen.  Therapeutic Interventions: 1 to 1 sessions, Unit Group sessions and Medication administration.  Evaluation of Outcomes: Progressing   RN Treatment Plan for Primary Diagnosis: Schizoaffective disorder, bipolar type ([place]) Long Term Goal(s): Knowledge of disease and therapeutic regimen to maintain health will improve  Short Term Goals: Ability to remain free from injury will improve, Ability to verbalize frustration and anger appropriately will improve, Ability to demonstrate self-control, Ability to participate in decision making will improve, Ability to verbalize feelings will improve, Ability to disclose and discuss suicidal ideas, Ability to identify and develop effective coping behaviors will improve, and Compliance with prescribed medications will improve  Medication Management: RN will  administer medications as ordered by provider, will assess and evaluate patient's response and provide education to patient for prescribed medication. RN will report any adverse and/or side effects to prescribing provider.  Therapeutic Interventions: 1 on 1 counseling sessions, Psychoeducation, Medication administration, Evaluate responses to treatment, Monitor vital signs and CBGs as ordered, Perform/monitor CIWA, COWS, AIMS and Fall Risk screenings as ordered, Perform wound care treatments as ordered.  Evaluation of Outcomes: Progressing   LCSW Treatment Plan for Primary Diagnosis: Schizoaffective disorder, bipolar type ([place]) Long Term Goal(s): Safe transition to appropriate next level of care at discharge, Engage patient in therapeutic group addressing interpersonal concerns.  Short Term Goals: Engage patient in aftercare planning with referrals and resources, Increase social support, Increase ability to appropriately verbalize feelings, Increase emotional regulation, Facilitate acceptance of mental health diagnosis and concerns, Facilitate patient progression through stages of change regarding substance use diagnoses and concerns, Identify triggers associated with mental health/substance abuse issues, and Increase skills for wellness and recovery  Therapeutic Interventions: Assess for all discharge needs, 1 to 1 time with Social worker, Explore available resources and support systems, Assess for adequacy in community support network, Educate family and significant other(s) on suicide prevention, Complete Psychosocial Assessment, Interpersonal group therapy.  Evaluation of Outcomes: Progressing   Progress in  Treatment: Attending groups: Yes. Participating in groups: No. Taking medication as prescribed: Yes. Toleration medication: Yes. Family/Significant other contact made: No, will contact:  brother Lonzo Candy [phone])- 1st attempt 01/30/22; attempt x2 02/01/22 Patient understands  diagnosis: Yes. Discussing patient identified problems/goals with staff: Yes. Medical problems stabilized or resolved: Yes. Denies suicidal/homicidal ideation: Yes. Issues/concerns per patient self-inventory: No.   New problem(s) identified: No, Describe:  n/a  New Short Term/Long Term Goal(s): update treatment team  Patient Goals:  detox, medication management for mood stabilization; elimination of SI thoughts; development of comprehensive mental wellness/sobriety plan     Discharge Plan or Barriers: Patient continues to need stabilization on meds, patient continues to be depressed with flat affect  Reason for Continuation of Hospitalization: Depression Medication stabilization Suicidal ideation, hallucinations, anxiety  Estimated Length of Stay: 1 to 2 days   Scribe for Treatment Team: Zachery Conch, LCSW 02/04/2022 10:20 AM

## 2022-02-04 NOTE — BHH Group Notes (Signed)
Spirituality group facilitated by Chaplain Katy Shaquasha Gerstel, BCC.   Group Description: Group focused on topic of hope. Patients participated in facilitated discussion around topic, connecting with one another around experiences and definitions for hope. Group members engaged with visual explorer photos, reflecting on what hope looks like for them today. Group engaged in discussion around how their definitions of hope are present today in hospital.   Modalities: Psycho-social ed, Adlerian, Narrative, MI   Patient Progress: Did not attend.  

## 2022-02-04 NOTE — Group Note (Signed)
Type of Therapy and Topic:  Group Therapy:  Stress Management   Participation Level:  Did Not Attend    Description of Group:  Patients in this group were introduced to the idea of stress and encouraged to discuss negative and positive ways to manage stress. Patients discussed specific stressors that they have in their life right now and the physical signs and symptoms associated with that stress.  Patient encouraged to come up with positive changes to assist with the stress upon discharge in order to prevent future hospitalizations.   They also worked as a group on developing a specific plan for several patients to deal with stressors through [place], psychoeducation and self care techniques   Therapeutic Goals:               1)  To discuss the positive and negative impacts of stress             2)  identify signs and symptoms of stress             3)  generate ideas for stress management             4)  offer mutual support to others regarding stress management             5)  Developing plans for ways to manage specific stressors upon discharge               Summary of Patient Progress:  Did not attend   Therapeutic Modalities:   Motivational Interviewing Brief Solution-Focused Therapy   Giara Mcgaughey Tolar, LCSW, [facility]

## 2022-02-04 NOTE — Progress Notes (Signed)
Pt is guarded on approach but is pleasant and cooperative.  Pt took medications without incident.  Pt says that he is getting LAI tomorrow.  Pt was more conversational with pt's after dinner.

## 2022-02-04 NOTE — Group Note (Signed)
Recreation Therapy Group Note   Group Topic:Communication  Group Date: 02/04/2022 Start Time: 1000 End Time: 1038 Facilitators: Victorino Sparrow, LRT,CTRS Location: [address] Dayroom   Goal Area(s) Addresses:  Patient will effectively listen to complete activity.  Patient will identify communication skills used to make activity successful.  Patient will identify how skills used during activity can be used to reach post d/c goals.      Group Description:  Geometric Drawings.  Three volunteers from the peer group will be shown an abstract picture with a particular arrangement of geometrical shapes.  Each round, one 'speaker' will describe the pattern, as accurately as possible without revealing the image to the group.  The remaining group members will listen and draw the picture to reflect how it is described to them. Patients with the role of 'listener' cannot ask clarifying questions but, may request that the speaker repeat a direction. Once the drawings are complete, the presenter will show the rest of the group the picture and compare how close each person came to drawing the picture. LRT will facilitate a post-activity discussion regarding effective communication and the importance of planning, listening, and asking for clarification in daily interactions with others.   Affect/Mood: N/A   Participation Level: Did not attend    Clinical Observations/Individualized Feedback:      Plan: Continue to engage patient in RT group sessions 2-3x/week.   Victorino Sparrow, LRT,CTRS 02/04/2022 11:58 AM

## 2022-02-04 NOTE — Progress Notes (Signed)
Adult Psychoeducational Group Note  Date:  02/04/2022 Time:  9:58 PM  Group Topic/Focus:  Wrap-Up Group:   The focus of this group is to help patients review their daily goal of treatment and discuss progress on daily workbooks.  Participation Level:  Minimal  Participation Quality:  Appropriate  Affect:  Appropriate  Cognitive:  Appropriate  Insight: Appropriate  Engagement in Group:  Limited  Modes of Intervention:  Discussion  Additional Comments:   Pt stated his goal for today was to focus on his treatment plan. Pt stated he accomplished his goal today. Pt stated he  talked with his doctor and was able to speak with his social worker about his care today. Pt rated his overall day a 10. Pt stated he made no calls today. Pt stated he felt better about himself today. Pt stated he was able to attend all meals. Pt stated he took all medications provided today. Pt stated he attend all groups held today. Pt stated his appetite was pretty good today. Pt rated sleep last night was pretty good. Pt stated the goal tonight was to get some rest. Pt stated he had no physical pain tonight. Pt deny visual hallucinations and auditory issues tonight. Pt denies thoughts of harming himself or others. Pt stated he would alert staff if anything changed  Candy Sledge 02/04/2022, 9:58 PM

## 2022-02-04 NOTE — [facility] Group Notes (Signed)
The focus of this group is to help patients establish daily goals to achieve during treatment and discuss how the patient can incorporate goal setting into their daily lives to aide in recovery.  Tyler White goal for today is to take all his medicine like the doctor wants him to do.

## 2022-02-04 NOTE — Progress Notes (Signed)
°   02/04/22 2200  Psych Admission Type (Psych Patients Only)  Admission Status Voluntary  Psychosocial Assessment  Patient Complaints Restlessness  Eye Contact Brief  Facial Expression Flat  Affect Flat  Speech Logical/coherent  Interaction Minimal;Guarded;No initiation  Motor Activity Other (Comment) (WDL)  Appearance/Hygiene Unremarkable  Behavior Characteristics Cooperative  Mood Anxious  Aggressive Behavior  Effect No apparent injury  Thought Process  Coherency Blocking  Content WDL  Delusions None reported or observed  Perception WDL  Hallucination None reported or observed  Judgment Limited  Confusion None  Danger to Self  Current suicidal ideation? Denies  Self-Injurious Behavior No self-injurious ideation or behavior indicators observed or expressed   Agreement Not to Harm Self Yes  Description of Agreement Verbal Contract  Danger to Others  Danger to Others None reported or observed

## 2022-02-05 MED ORDER — PALIPERIDONE PALMITATE ER 234 MG/1.5ML IM SUSY
234.0000 mg | PREFILLED_SYRINGE | Freq: Once | INTRAMUSCULAR | Status: AC
  Administered 2022-02-05: 234 mg via INTRAMUSCULAR

## 2022-02-05 MED ORDER — OLANZAPINE 2.5 MG PO TABS
2.5000 mg | ORAL_TABLET | Freq: Every day | ORAL | Status: DC
  Administered 2022-02-05 – 2022-02-08 (×4): 2.5 mg via ORAL
  Filled 2022-02-05 (×6): qty 1

## 2022-02-05 MED ORDER — PALIPERIDONE PALMITATE ER 234 MG/1.5ML IM SUSY
234.0000 mg | PREFILLED_SYRINGE | Freq: Once | INTRAMUSCULAR | Status: DC
  Filled 2022-02-05: qty 1.5

## 2022-02-05 NOTE — Progress Notes (Signed)
[facility] MD Progress Note  02/05/2022 12:37 PM Tyler White  MRN:  [id number] Subjective:    This is one of several psychiatric admissions in this [facility] for this 26 year old AA male with hx of Schizoaffective disorder & cocaine use disorder. He was admitted for command auditory hallucinations to harm himself and other, and had recently cut himself with a knife.   Recommendations made by the psychiatry team yesterday: -Continue Risperdal to 2 mg qam and 2mg  qhs - for Schizoaffective disorder. Plan for invega LAI tomorrow. -Continue Benztropine 0.5 mg po bid for prevention of eps. -Increase Propranolol 20 mg po bid for anxiety/restlessness.  -Continue zyprexa 5 mg tid prn for psychosis (separate PRN order from agitation) -Continue zyprexa 5 mg qhs for psychosis   On exam today the patient is less restless than yesterday. Per nurse, pt is responding to internal stimuli more and c/o AH.  To me, the pt c/o AH that are more distressing compared to yesterday. Denies CAH. Mood is withdawn. Affect is flat. Negative symptoms have worsened since stopping abilify, but AH have improved since starting zyprexa. Pt reports that anxiety and feeling of restlessness is less compared to yesterday. Reports sleep is stable. Appetite is stable.  Denies SI. Denies HI.  Pt agreeable with invega Lai to be administered today (he had invega lai 2 hospitalizations ago and tolerated without problem).  We discussed adding small dose of zyprexa in the morning for anxiety and AH, and pt is agreeable.  He otherwise denies s/e to current psych meds.     Principal Problem: Schizoaffective disorder, bipolar type ([place]) Diagnosis: Principal Problem:   Schizoaffective disorder, bipolar type ([place]) Active Problems:   Schizoaffective disorder ([place])  Total Time spent with patient: 20 minutes  Past Psychiatric History: Schizoaffective disorder, bipolar-type. H/o mult psych hosp. H/o med non compliance.   Past Medical History:   Past Medical History:  Diagnosis Date   Hernia, inguinal, right    Inguinal hernia    right   Psychiatric illness    Schizophrenia ([place])    History reviewed. No pertinent surgical history. Family History:  Family History  Problem Relation Age of Onset   Psychiatric Illness Mother    Hypertension Sister    Family Psychiatric  History: see H&P Social History:  Social History   Substance and Sexual Activity  Alcohol Use Not Currently     Social History   Substance and Sexual Activity  Drug Use Not Currently   Types: Marijuana, Cocaine   Comment: denies currently    Social History   Socioeconomic History   Marital status: Single    Spouse name: Not on file   Number of children: Not on file   Years of education: 10   Highest education level: Not on file  Occupational History   Not on file  Tobacco Use   Smoking status: Every Day    Packs/day: 0.50    Years: 5.00    Pack years: 2.50    Types: Cigarettes   Smokeless tobacco: Never  Vaping Use   Vaping Use: Never used  Substance and Sexual Activity   Alcohol use: Not Currently   Drug use: Not Currently    Types: Marijuana, Cocaine    Comment: denies currently   Sexual activity: Yes    Birth control/protection: None  Other Topics Concern   Not on file  Social History Narrative   ** Merged History Encounter **       ** Merged History Encounter **  Social Determinants of Health   Financial Resource Strain: Not on file  Food Insecurity: Not on file  Transportation Needs: Not on file  Physical Activity: Not on file  Stress: Not on file  Social Connections: Not on file   Additional Social History:                         Sleep: Fair  Appetite:  Fair  Current Medications: Current Facility-Administered Medications  Medication Dose Route Frequency Provider Last Rate Last Admin   acetaminophen (TYLENOL) tablet 650 mg  650 mg Oral Q6H PRN Lindell Spar I, NP   650 mg at 02/01/22 1137    alum & mag hydroxide-simeth (MAALOX/MYLANTA) 200-200-20 MG/5ML suspension 30 mL  30 mL Oral Q4H PRN Lindell Spar I, NP       benztropine (COGENTIN) tablet 0.5 mg  0.5 mg Oral BID Lindon Romp A, NP   0.5 mg at 02/05/22 7867   hydrOXYzine (ATARAX) tablet 25 mg  25 mg Oral TID PRN Prescilla Sours, PA-C   25 mg at 02/04/22 2043   magnesium hydroxide (MILK OF MAGNESIA) suspension 30 mL  30 mL Oral Daily PRN Lindell Spar I, NP       nicotine (NICODERM CQ - dosed in mg/24 hours) patch 21 mg  21 mg Transdermal Q0600 Lindell Spar I, NP       OLANZapine (ZYPREXA) tablet 2.5 mg  2.5 mg Oral Daily Onya Eutsler, MD       OLANZapine (ZYPREXA) tablet 5 mg  5 mg Oral QHS Ellysa Parrack, Ovid Curd, MD   5 mg at 02/04/22 2043   OLANZapine zydis (ZYPREXA) disintegrating tablet 5 mg  5 mg Oral TID PRN Janine Limbo, MD   5 mg at 02/01/22 1137   OLANZapine zydis (ZYPREXA) disintegrating tablet 5 mg  5 mg Oral Q8H PRN Divon Krabill, Ovid Curd, MD       And   ziprasidone (GEODON) injection 20 mg  20 mg Intramuscular PRN Monti Jilek, Ovid Curd, MD       paliperidone (INVEGA SUSTENNA) injection 234 mg  234 mg Intramuscular Once Laquinton Bihm, Ovid Curd, MD       propranolol (INDERAL) tablet 20 mg  20 mg Oral BID Ines Rebel, Ovid Curd, MD   20 mg at 02/05/22 6720   risperiDONE (RISPERDAL) tablet 2 mg  2 mg Oral QHS Nkwenti, Doris, NP   2 mg at 02/04/22 2043   traZODone (DESYREL) tablet 50 mg  50 mg Oral QHS PRN Prescilla Sours, PA-C   50 mg at 02/04/22 2043    Lab Results: No results found for this or any previous visit (from the past 48 hour(s)).  Blood Alcohol level:  Lab Results  Component Value Date   ETH <10 01/28/2022   ETH <10 94/70/9628    Metabolic Disorder Labs: Lab Results  Component Value Date   HGBA1C 5.2 01/28/2022   MPG 102.54 01/28/2022   MPG 96.8 10/09/2021   Lab Results  Component Value Date   PROLACTIN 32.5 (H) 05/30/2018   Lab Results  Component Value Date   CHOL 161 12/13/2021   TRIG 81 12/13/2021    HDL 75 12/13/2021   CHOLHDL 2.1 12/13/2021   VLDL 16 12/13/2021   LDLCALC 70 12/13/2021   LDLCALC 96 10/09/2021    Physical Findings: AIMS: Facial and Oral Movements Muscles of Facial Expression: None, normal Lips and Perioral Area: None, normal Jaw: None, normal Tongue: None, normal,Extremity Movements Upper (arms, wrists, hands, fingers): None, normal Lower (  legs, knees, ankles, toes): None, normal, Trunk Movements Neck, shoulders, hips: None, normal, Overall Severity Severity of abnormal movements (highest score from questions above): None, normal Incapacitation due to abnormal movements: None, normal Patient's awareness of abnormal movements (rate only patient's report): No Awareness, Dental Status Current problems with teeth and/or dentures?: No Does patient usually wear dentures?: No  CIWA:    COWS:     Musculoskeletal: Strength & Muscle Tone: within normal limits Gait & Station: normal Patient leans: N/A  Psychiatric Specialty Exam:  Presentation  General Appearance: Casual (laying in bed)  Eye Contact:Minimal  Speech:Slow  Speech Volume:Normal  Handedness:Right   Mood and Affect  Mood:Anxious  Affect:Constricted   Thought Process  Thought Processes:Goal Directed  Descriptions of Associations:Intact  Orientation:Full (Time, Place and Person)  Thought Content:Logical  History of Schizophrenia/Schizoaffective disorder:Yes  Duration of Psychotic Symptoms:Greater than six months  Hallucinations:Hallucinations: Auditory Description of Auditory Hallucinations: whispers. denies CAH  Ideas of Reference:None  Suicidal Thoughts:Suicidal Thoughts: No  Homicidal Thoughts:Homicidal Thoughts: No   Sensorium  Memory:Immediate Fair; Recent Fair; Remote Good  Judgment:Fair  Insight:Fair   Executive Functions  Concentration:Poor  Attention Span:Fair  [place]   Psychomotor Activity   Psychomotor Activity:Psychomotor Activity: Increased; Extrapyramidal Side Effects (EPS); Restlessness Extrapyramidal Side Effects (EPS): Akathisia   Assets  Assets:Communication Skills   Sleep  Sleep:Sleep: Fair    Physical Exam: Physical Exam Vitals reviewed.  Constitutional:      General: He is not in acute distress.    Appearance: He is normal weight. He is not toxic-appearing.  Pulmonary:     Effort: Pulmonary effort is normal.  Neurological:     Mental Status: He is alert.     Motor: No weakness.     Gait: Gait normal.   Review of Systems  Constitutional:  Negative for chills and fever.  Cardiovascular:  Negative for chest pain and palpitations.  Neurological:        Restless  Psychiatric/Behavioral:  Positive for hallucinations. The patient is nervous/anxious.   Blood pressure (!) 112/98, pulse 92, temperature (!) 97.3 F (36.3 C), temperature source Oral, resp. rate 16, height 5\' 8"  (1.727 m), weight 69.9 kg, SpO2 96 %. Body mass index is 23.42 kg/m.   Treatment Plan Summary:  ASSESSMENT:   Diagnoses / Active Problems: -schizoaffective disorder, bipolar type    PLAN: Safety and Monitoring:             -- Involuntary admission to inpatient psychiatric unit for safety, stabilization and treatment             -- Daily contact with patient to assess and evaluate symptoms and progress in treatment             -- Patient's case to be discussed in multi-disciplinary team meeting             -- Observation Level : q15 minute checks             -- Vital signs:  q12 hours             -- Precautions: suicide, elopement, and assault   2. Psychiatric Diagnoses and Treatment:               -Order invega sustenna LAI 234 mg IM once today (02-05-22) -Decrease Risperdal from 2 mg bid -> to 2 mg qhs  for 7 days, then stop (02-12-22) as pt is receiving invega sustenna LAI today.  -Continue Benztropine 0.5 mg  po bid for prevention of eps. -Continue propranolol 20 mg po bid  for anxiety/restlessness.  -Continue zyprexa 5 mg tid prn for psychosis (separate PRN order from agitation) -INCREASE  zyprexa from 5 mg qhs -> to 2.g mg in the morning and 5 mg at bedtime - for psychosis. Pt's positive symptoms responded well to switching from abilify to zyprexa, but his negative symptoms have worsened since this stopping abilify. We will first maximize treatmetn of positive symptoms, then evaluate switch to abilify or not. Additionally, the pt is sensitive to akathesia, improved since yesterday, which could be worse of abilfy vs zyprexa.     Agitation protocols. -Continue Zyprexa zydis 10 mg po Q 8 hrs prn. & -Lorazepam 1 mg po prn x 1 dose.  & Geodon 20 mg po IM x 1 dose.   Other prns. Acetaminophen 650 mg po Q 6 hrs prn for fever/pain.  MOM 30 ml po Q daily prn for constipation. Mylanta 30 ml po Q 4 hrs prn for indigestion.   3. Treat health problems as indicated.  4. Develop treatment plan to decrease risk of relapse upon discharge and the need for readmission.  5. Psycho-social education regarding relapse prevention and self care.  6. Health care follow up as needed for medical problems.  7. Review, reconcile, and reinstate any pertinent home medications for other health issues where appropriate. 8. Call for consults with hospitalist for any additional specialty patient care services as needed.    4. Discharge Planning:              -- Social work and case management to assist with discharge planning and identification of hospital follow-up needs prior to discharge             -- Estimated LOS: 5-7 days             -- Discharge Concerns: Need to establish a safety plan; Medication compliance and effectiveness             -- Discharge Goals: Return home with outpatient referrals for mental health follow-up including medication management/psychotherapy  Christoper Allegra, MD 02/05/2022, 12:37 PM  Total Time Spent in Direct Patient Care:  I personally spent 30  minutes on the unit in direct patient care. The direct patient care time included face-to-face time with the patient, reviewing the patient's chart, communicating with other professionals, and coordinating care. Greater than 50% of this time was spent in counseling or coordinating care with the patient regarding goals of hospitalization, psycho-education, and discharge planning needs.   Janine Limbo, MD Psychiatrist

## 2022-02-05 NOTE — Plan of Care (Signed)
°  Problem: Activity: Goal: Interest or engagement in activities will improve Outcome: Progressing   Problem: Coping: Goal: Ability to verbalize frustrations and anger appropriately will improve Outcome: Progressing   Problem: Coping: Goal: Ability to demonstrate self-control will improve Outcome: Progressing   Problem: Safety: Goal: Periods of time without injury will increase Outcome: Progressing   

## 2022-02-05 NOTE — Group Note (Signed)
Recreation Therapy Group Note   Group Topic:Healthy Support Systems  Group Date: 02/05/2022 Start Time: 1000 End Time: 1020 Facilitators: Victorino Sparrow, LRT,CTRS Location: [address] Dayroom   Goal Area(s) Addresses:  Patient will identify members of their support system. Patient will acknowledge benefit of healthy supports in daily life.   Group Description: Social Support.  LRT led a discussion on the different ways support systems offer social support (emotional, tangible, informational and social).  Patients would then identify their three biggest supporters, how they help them and what barriers the patient from using the help their supports offer   Affect/Mood: N/A   Participation Level: Did not attend    Clinical Observations/Individualized Feedback:     Plan: Continue to engage patient in RT group sessions 2-3x/week.   Victorino Sparrow, Glennis Brink 02/05/2022 12:47 PM

## 2022-02-05 NOTE — Progress Notes (Signed)
Adult Psychoeducational Group Note  Date:  02/05/2022 Time:  11:26 PM  Group Topic/Focus:  Wrap-Up Group:   The focus of this group is to help patients review their daily goal of treatment and discuss progress on daily workbooks.  Participation Level:  Minimal  Participation Quality:  Appropriate  Affect:  Appropriate  Cognitive:  Appropriate  Insight: Appropriate  Engagement in Group:  Engaged  Modes of Intervention:  Discussion  Additional Comments:   Pt stated his goal for today was to focus on his treatment plan. Pt stated he accomplished his goal today. Pt stated he  talked with his doctor and was able to speak with his social worker about his care today. Pt rated his overall day a 10. Pt stated he made no calls today. Pt stated he felt better about himself today. Pt stated he was able to attend all meals. Pt stated he took all medications provided today.  Pt stated his appetite was pretty good today. Pt rated sleep last night was pretty good. Pt stated the goal tonight was to get some rest. Pt stated he had no physical pain tonight. Pt deny visual hallucinations and auditory issues tonight. Pt denies thoughts of harming himself or others. Pt stated he would alert staff if anything changed  Candy Sledge 02/05/2022, 11:26 PM

## 2022-02-05 NOTE — Progress Notes (Signed)
°   02/05/22 2100  Psych Admission Type (Psych Patients Only)  Admission Status Voluntary  Psychosocial Assessment  Patient Complaints Worrying  Eye Contact Brief  Facial Expression Flat  Affect Flat  Speech Logical/coherent  Interaction Minimal;Guarded;No initiation  Motor Activity Other (Comment) (WDL)  Appearance/Hygiene Unremarkable  Behavior Characteristics Cooperative  Mood Pleasant;Preoccupied  Aggressive Behavior  Effect No apparent injury  Thought Process  Coherency Blocking  Content WDL  Delusions None reported or observed  Perception WDL  Hallucination None reported or observed  Judgment Limited  Confusion None  Danger to Self  Current suicidal ideation? Denies  Self-Injurious Behavior No self-injurious ideation or behavior indicators observed or expressed   Agreement Not to Harm Self Yes  Description of Agreement Verbal Contract  Danger to Others  Danger to Others None reported or observed

## 2022-02-05 NOTE — Progress Notes (Signed)
°   02/05/22 1200  Psych Admission Type (Psych Patients Only)  Admission Status Voluntary  Psychosocial Assessment  Patient Complaints Worrying  Eye Contact Brief  Facial Expression Flat  Affect Flat  Speech Logical/coherent  Interaction Minimal;Guarded;No initiation  Motor Activity Other (Comment) (WDL)  Appearance/Hygiene Unremarkable  Behavior Characteristics Cooperative  Mood Preoccupied  Aggressive Behavior  Effect No apparent injury  Thought Process  Coherency Blocking  Content WDL  Delusions None reported or observed  Perception WDL  Hallucination None reported or observed  Judgment Limited  Confusion None  Danger to Self  Current suicidal ideation? Denies  Self-Injurious Behavior No self-injurious ideation or behavior indicators observed or expressed   Agreement Not to Harm Self Yes  Description of Agreement Verbal Contract  Danger to Others  Danger to Others None reported or observed

## 2022-02-05 NOTE — Progress Notes (Signed)
°   02/05/22 0530  Sleep  Number of Hours 6

## 2022-02-06 NOTE — Group Note (Signed)
LCSW Group Therapy Note   Group Date: 02/06/2022 Start Time: 1300 End Time: 1400    Type of Therapy and Topic:  Group Therapy:  Strengths Exploration   Participation Level: Did Not Attend  Description of Group: This group allows individuals to explore their strengths, learn to use strengths in new ways to improve well-being. Strengths-based interventions involve identifying strengths, understanding how they are used, and learning new ways to apply them. Individuals will identify their strengths, and then explore their roles in different areas of life (relationships, professional life, and personal fulfillment). Individuals will think about ways in which they currently use their strengths, along with new ways they could begin using them.    Therapeutic Goals Patient will verbalize two of their strengths Patient will identify how their strengths are currently used Patient will identify two new ways to apply their strengths  Patients will create a plan to apply their strengths in their daily lives     Summary of Patient Progress:  Did not attend     Therapeutic Modalities Cognitive Behavioral Therapy Motivational Interviewing   Darletta Moll MSW, [facility]

## 2022-02-06 NOTE — Progress Notes (Signed)
°   02/06/22 2000  Psych Admission Type (Psych Patients Only)  Admission Status Voluntary  Psychosocial Assessment  Patient Complaints Worrying  Eye Contact Brief  Facial Expression Flat;Worried  Affect Flat  Speech Logical/coherent  Interaction Minimal;Guarded;Cautious  Motor Activity Other (Comment) (WDL)  Appearance/Hygiene Unremarkable  Behavior Characteristics Cooperative  Mood Preoccupied  Aggressive Behavior  Effect No apparent injury  Thought Process  Coherency Concrete thinking  Content WDL  Delusions None reported or observed  Perception WDL  Hallucination None reported or observed  Judgment Limited  Confusion None  Danger to Self  Current suicidal ideation? Denies  Self-Injurious Behavior No self-injurious ideation or behavior indicators observed or expressed   Agreement Not to Harm Self Yes  Description of Agreement Verbal Contract  Danger to Others  Danger to Others None reported or observed

## 2022-02-06 NOTE — [facility] Group Notes (Signed)
Pt did not attend wrap up group this evening. Pt was in bed sleeping.  

## 2022-02-06 NOTE — Progress Notes (Signed)
[facility] MD Progress Note  02/06/2022 10:08 AM Tyler White  MRN:  [id number]  Subjective: Tyler White reports, "My mood is balanced. I'm feeling less restless & I'm doing a lot better". Daily notes 02-06-22: Vista Deck is seen, chart reviewed. The chart findings dicussed with the treatment. He is seen in his room while sitting on his bed. He presents alert, oriented & aware of situation. His affect is good & his making a good eye contact. He feels he doing much better than when first admitted to the hospital. Etter Sjogren received his [place] sustenna shot yesterday. He has been referred to the Act team & an appointment for an interview with the Act Team is being arranged by the social worker. The plan is, if all go well with the interview, Tyler White will be discharged tomorrow. He says he has been given a name/information of a group home in [place] to call for placement. He says he will call this group home later this afternoon for bed availability. Tyler White says getting an act team will help him maintain mood stability as it has helped him in the past. He also states that he is going to stay off drugs because he realized now that it messed with his mind, although he was thinking drugs helped ease his mind when was stressed. He concluded by saying that part of his stressor is not being able to see his 62 year old daughter that he has not seen in 3 years. He currently denies any SIHI, AVH, delusional thoughts or paranoia. He is taking & tolerating his treatment regimen. Will continue as already in progress.  Reason for admission: This is one of several psychiatric admissions in this [facility] for this 26 year old AA male with hx of Schizoaffective disorder & cocaine use disorder. He was admitted for command auditory hallucinations to harm himself and other, and had recently cut himself with a knife.   Recommendations made by the psychiatry team yesterday: -Continue Risperdal to 2 mg qam and 2mg  qhs - for Schizoaffective  disorder. Plan for invega LAI tomorrow. -Continue Benztropine 0.5 mg po bid for prevention of eps. -Continue Propranolol 20 mg po bid for anxiety/restlessness.  -Continue zyprexa 5 mg tid prn for psychosis (separate PRN order from agitation) -Continue zyprexa 5 mg qhs for psychosis   Per previous daily notes: On exam today the patient is less restless than yesterday. Per nurse, pt is responding to internal stimuli more and c/o AH.  To me, the pt c/o AH that are more distressing compared to yesterday. Denies CAH. Mood is withdawn. Affect is flat. Negative symptoms have worsened since stopping abilify, but AH have improved since starting zyprexa. Pt reports that anxiety and feeling of restlessness is less compared to yesterday. Reports sleep is stable. Appetite is stable.  Denies SI. Denies HI.  Pt agreeable with invega Lai to be administered today (he had invega lai 2 hospitalizations ago and tolerated without problem).  We discussed adding small dose of zyprexa in the morning for anxiety and AH, and pt is agreeable.  He otherwise denies s/e to current psych meds.   Principal Problem: Schizoaffective disorder, bipolar type ([place]) Diagnosis: Principal Problem:   Schizoaffective disorder, bipolar type ([place]) Active Problems:   Schizoaffective disorder ([place])  Total Time spent with patient: 15 minutes  Past Psychiatric History: Schizoaffective disorder, bipolar-type. H/o mult psych hosp. H/o med non compliance.   Past Medical History:  Past Medical History:  Diagnosis Date   Hernia, inguinal, right    Inguinal hernia  right   Psychiatric illness    Schizophrenia ([place])    History reviewed. No pertinent surgical history.  Family History:  Family History  Problem Relation Age of Onset   Psychiatric Illness Mother    Hypertension Sister    Family Psychiatric  History: See H&P  Social History:  Social History   Substance and Sexual Activity  Alcohol Use Not Currently     Social  History   Substance and Sexual Activity  Drug Use Not Currently   Types: Marijuana, Cocaine   Comment: denies currently    Social History   Socioeconomic History   Marital status: Single    Spouse name: Not on file   Number of children: Not on file   Years of education: 10   Highest education level: Not on file  Occupational History   Not on file  Tobacco Use   Smoking status: Every Day    Packs/day: 0.50    Years: 5.00    Pack years: 2.50    Types: Cigarettes   Smokeless tobacco: Never  Vaping Use   Vaping Use: Never used  Substance and Sexual Activity   Alcohol use: Not Currently   Drug use: Not Currently    Types: Marijuana, Cocaine    Comment: denies currently   Sexual activity: Yes    Birth control/protection: None  Other Topics Concern   Not on file  Social History Narrative   ** Merged History Encounter **       ** Merged History Encounter **       Social Determinants of Health   Financial Resource Strain: Not on file  Food Insecurity: Not on file  Transportation Needs: Not on file  Physical Activity: Not on file  Stress: Not on file  Social Connections: Not on file   Additional Social History:   Sleep: Fair  Appetite:  Fair  Current Medications: Current Facility-Administered Medications  Medication Dose Route Frequency Provider Last Rate Last Admin   acetaminophen (TYLENOL) tablet 650 mg  650 mg Oral Q6H PRN Lindell Spar I, NP   650 mg at 02/01/22 1137   alum & mag hydroxide-simeth (MAALOX/MYLANTA) 200-200-20 MG/5ML suspension 30 mL  30 mL Oral Q4H PRN Lindell Spar I, NP       benztropine (COGENTIN) tablet 0.5 mg  0.5 mg Oral BID Lindon Romp A, NP   0.5 mg at 02/06/22 1002   hydrOXYzine (ATARAX) tablet 25 mg  25 mg Oral TID PRN Prescilla Sours, PA-C   25 mg at 02/05/22 2048   magnesium hydroxide (MILK OF MAGNESIA) suspension 30 mL  30 mL Oral Daily PRN Lindell Spar I, NP       OLANZapine (ZYPREXA) tablet 2.5 mg  2.5 mg Oral Daily Massengill,  Nathan, MD   2.5 mg at 02/06/22 1003   OLANZapine (ZYPREXA) tablet 5 mg  5 mg Oral QHS Massengill, Nathan, MD   5 mg at 02/05/22 2049   OLANZapine zydis (ZYPREXA) disintegrating tablet 5 mg  5 mg Oral TID PRN Janine Limbo, MD   5 mg at 02/01/22 1137   OLANZapine zydis (ZYPREXA) disintegrating tablet 5 mg  5 mg Oral Q8H PRN Massengill, Ovid Curd, MD       And   ziprasidone (GEODON) injection 20 mg  20 mg Intramuscular PRN Massengill, Ovid Curd, MD       propranolol (INDERAL) tablet 20 mg  20 mg Oral BID Massengill, Ovid Curd, MD   20 mg at 02/06/22 1002   risperiDONE (RISPERDAL) tablet  2 mg  2 mg Oral QHS Nkwenti, Doris, NP   2 mg at 02/05/22 2049   traZODone (DESYREL) tablet 50 mg  50 mg Oral QHS PRN Prescilla Sours, PA-C   50 mg at 02/05/22 2048   Lab Results: No results found for this or any previous visit (from the past 48 hour(s)).  Blood Alcohol level:  Lab Results  Component Value Date   ETH <10 01/28/2022   ETH <10 95/28/4132   Metabolic Disorder Labs: Lab Results  Component Value Date   HGBA1C 5.2 01/28/2022   MPG 102.54 01/28/2022   MPG 96.8 10/09/2021   Lab Results  Component Value Date   PROLACTIN 32.5 (H) 05/30/2018   Lab Results  Component Value Date   CHOL 161 12/13/2021   TRIG 81 12/13/2021   HDL 75 12/13/2021   CHOLHDL 2.1 12/13/2021   VLDL 16 12/13/2021   LDLCALC 70 12/13/2021   LDLCALC 96 10/09/2021   Physical Findings: AIMS: Facial and Oral Movements Muscles of Facial Expression: None, normal Lips and Perioral Area: None, normal Jaw: None, normal Tongue: None, normal,Extremity Movements Upper (arms, wrists, hands, fingers): None, normal Lower (legs, knees, ankles, toes): None, normal, Trunk Movements Neck, shoulders, hips: None, normal, Overall Severity Severity of abnormal movements (highest score from questions above): None, normal Incapacitation due to abnormal movements: None, normal Patient's awareness of abnormal movements (rate only patient's  report): No Awareness, Dental Status Current problems with teeth and/or dentures?: No Does patient usually wear dentures?: No  CIWA:    COWS:     Musculoskeletal: Strength & Muscle Tone: within normal limits Gait & Station: normal Patient leans: N/A  Psychiatric Specialty Exam:  Presentation  General Appearance: Casual (laying in bed)  Eye Contact:Minimal  Speech:Slow  Speech Volume:Normal  Handedness:Right   Mood and Affect  Mood:Anxious  Affect:Constricted  Thought Process  Thought Processes:Goal Directed  Descriptions of Associations:Intact  Orientation:Full (Time, Place and Person)  Thought Content:Logical  History of Schizophrenia/Schizoaffective disorder:Yes  Duration of Psychotic Symptoms:Greater than six months  Hallucinations:Hallucinations: Auditory Description of Auditory Hallucinations: whispers. denies CAH  Ideas of Reference:None  Suicidal Thoughts:Suicidal Thoughts: No  Homicidal Thoughts:Homicidal Thoughts: No  Sensorium  Memory:Immediate Fair; Recent Fair; Remote Good  Judgment:Fair  Insight:Fair  Executive Functions  Concentration:Poor  Attention Span:Fair  [place]  Psychomotor Activity  Psychomotor Activity:Psychomotor Activity: Increased; Extrapyramidal Side Effects (EPS); Restlessness Extrapyramidal Side Effects (EPS): Akathisia  Assets  Assets:Communication Skills   Sleep  Sleep:Sleep: Fair  Physical Exam: Physical Exam Vitals reviewed.  Constitutional:      General: He is not in acute distress.    Appearance: He is normal weight. He is not toxic-appearing.  Pulmonary:     Effort: Pulmonary effort is normal.  Neurological:     Mental Status: He is alert.     Motor: No weakness.     Gait: Gait normal.   Review of Systems  Constitutional:  Negative for chills and fever.  Cardiovascular:  Negative for chest pain and palpitations.  Neurological:         Restless  Psychiatric/Behavioral:  Positive for hallucinations. The patient is nervous/anxious.   Blood pressure 115/72, pulse 94, temperature 98.9 F (37.2 C), temperature source Oral, resp. rate 16, height 5\' 8"  (1.727 m), weight 69.9 kg, SpO2 100 %. Body mass index is 23.42 kg/m.   Treatment Plan Summary:  ASSESSMENT:   Diagnoses / Active Problems: -Schizoaffective disorder, bipolar type    PLAN: Safety  and Monitoring:             -- Involuntary admission to inpatient psychiatric unit for safety, stabilization and treatment             -- Daily contact with patient to assess and evaluate symptoms and progress in treatment             -- Patient's case to be discussed in multi-disciplinary team meeting             -- Observation Level : q15 minute checks             -- Vital signs:  q12 hours             -- Precautions: suicide, elopement, and assault   2. Psychiatric Diagnoses and Treatment:               -Administered invega sustenna LAI 234 mg IM once today (02-05-22) -Continue Risperdal 2 mg qhs  for 7 days, then stop (02-12-22) as pt is receiving invega sustenna LAI today.  -Continue Benztropine 0.5 mg po bid for prevention of eps. -Continue propranolol 20 mg po bid for anxiety/restlessness.  -Continue zyprexa 5 mg tid prn for psychosis (separate PRN order from agitation) -Continue zyprexa 2.5 mg in the morning & 5 mg at bedtime - for psychosis. Pt's positive symptoms responded well to switching from abilify to zyprexa, but his negative symptoms have worsened since this stopping abilify. We will first maximize treatmetn of positive symptoms, then evaluate switch to abilify or not. Additionally, the pt is sensitive to akathesia, improved since yesterday, which could be worse of abilfy vs zyprexa.   Agitation protocols. -Continue Zyprexa zydis 10 mg po Q 8 hrs prn. & -Lorazepam 1 mg po prn x 1 dose.  & -Geodon 20 mg po IM x 1 dose.   Other prns. Acetaminophen 650 mg po Q 6  hrs prn for fever/pain.  MOM 30 ml po Q daily prn for constipation. Mylanta 30 ml po Q 4 hrs prn for indigestion.   3. Treat health problems as indicated.  4. Develop treatment plan to decrease risk of relapse upon discharge and the need for readmission.  5. Psycho-social education regarding relapse prevention and self care.  6. Health care follow up as needed for medical problems.  7. Review, reconcile, and reinstate any pertinent home medications for other health issues where appropriate. 8. Call for consults with hospitalist for any additional specialty patient care services as needed.    4. Discharge Planning:              -- Social work and case management to assist with discharge planning and identification of hospital follow-up needs prior to discharge             -- Estimated LOS: 5-7 days             -- Discharge Concerns: Need to establish a safety plan; Medication compliance and effectiveness             -- Discharge Goals: Return home with outpatient referrals for mental health follow-up including medication management/psychotherapy  Lindell Spar, NP, pmhnp, fnp-bc 02/06/2022, 10:08 AM Patient ID: Tyler White, male   DOB: 1996-06-18, 26 y.o.   MRN: [id number]

## 2022-02-06 NOTE — [facility] Group Notes (Signed)
A poem was read by poritia nelson in which patients were asked to identify negative behavioral patterns. Pt did not attend.

## 2022-02-06 NOTE — Group Note (Signed)
Recreation Therapy Group Note   Group Topic:Problem Solving  Group Date: 02/06/2022 Start Time: 1006 End Time: 1040 Facilitators: Victorino Sparrow, LRT,CTRS Location: [address] Dayroom   Goal Area(s) Addresses:  Patient will effectively work with peer towards shared goal.  Patient will identify factors that guided their decision making.  Patient will pro-socially communicate ideas during group session.   Group Description:  Patients were given a scenario that they were going to be stranded on a deserted [place] for several months before being rescued. Writer tasked them with making a list of 15 things they would choose to bring with them for "survival". The list of items was prioritized most important to least. Each patient would come up with their own list, then work together to create a new list of 15 items while in a group of 3-5 peers. LRT discussed each person's list and how it differed from others. The debrief included discussion of priorities, good decisions versus bad decisions, and how it is important to think before acting so we can make the best decision possible. LRT tied the concept of effective communication among group members to patient's support systems outside of the hospital and its benefit post discharge.   Affect/Mood: N/A   Participation Level: Did not attend    Clinical Observations/Individualized Feedback:      Plan: Continue to engage patient in RT group sessions 2-3x/week.   Victorino Sparrow, LRT,CTRS 02/06/2022 11:15 AM

## 2022-02-06 NOTE — Progress Notes (Signed)
°   02/06/22 0530  Sleep  Number of Hours 6.75

## 2022-02-07 DIAGNOSIS — F1590 Other stimulant use, unspecified, uncomplicated: Secondary | ICD-10-CM

## 2022-02-07 NOTE — Progress Notes (Signed)
[facility] MD Progress Note  02/07/2022 3:31 PM Tyler White  MRN:  [id number]  Subjective: Tyler White reports, "My mood is good. I feel ready to go. I want to be discharged today. I don't think that the Act Team people will show up. I also did not call the group home in [place], [place] because I know they may not have any beds available any way. I would be going to the [facility] after discharge. I have been there in the past". Daily notes 02-07-22: Tyler White is seen, chart reviewed. The chart findings dicussed with the treatment. He is seen in his room while sitting on his bed. He presents alert, oriented & aware of situation. His affect is good & is making a good eye contact. He feels he doing much better than when first admitted to the hospital. Etter Sjogren received his Lorayne Bender sustenna shot 2 days ago. He has been referred to the Act team & an appointment for an interview with the Act Team is being arranged by the social worker. The plan is, if all go well with the interview, Tyler White will be discharged tomorrow. However, he says he did not call this group home in [place], [place] that he was referred to, knowing they may not have any bed available for him any way. Tyler White says he feels the Act team staff will not come here to interview him & he is not optimistic that they will. As a result, he has asked to be discharged today to go to the [facility]. He maintained that he is going to stay off drugs because he realized now that it messed with his mind, although he was thinking drugs helped ease his mind when was stressed. He currently denies any SIHI, AVH, delusional thoughts or paranoia. He is taking & tolerating his treatment regimen. The treatment were able to convince [facility] to stay through tomorrow to give the Act Team staff time to interview him today. Will continue treatment plan as already in progress.  Reason for admission: This is one of several psychiatric admissions in this [facility] for this 26 year old AA male with hx of  Schizoaffective disorder & cocaine use disorder. He was admitted for command auditory hallucinations to harm himself and other, and had recently cut himself with a knife.   Recommendations made by the psychiatry team yesterday: -Continue Risperdal to 2 mg qam and 2mg  qhs - for Schizoaffective disorder. Plan for invega LAI tomorrow. -Continue Benztropine 0.5 mg po bid for prevention of eps. -Continue Propranolol 20 mg po bid for anxiety/restlessness.  -Continue zyprexa 5 mg tid prn for psychosis (separate PRN order from agitation) -Continue zyprexa 5 mg qhs for psychosis   Per previous daily notes: On exam today the patient is less restless than yesterday. Per nurse, pt is responding to internal stimuli more and c/o AH.  To me, the pt c/o AH that are more distressing compared to yesterday. Denies CAH. Mood is withdawn. Affect is flat. Negative symptoms have worsened since stopping abilify, but AH have improved since starting zyprexa. Pt reports that anxiety and feeling of restlessness is less compared to yesterday. Reports sleep is stable. Appetite is stable.  Denies SI. Denies HI.  Pt agreeable with invega Lai to be administered today (he had invega lai 2 hospitalizations ago and tolerated without problem).  We discussed adding small dose of zyprexa in the morning for anxiety and AH, and pt is agreeable.  He otherwise denies s/e to current psych meds.   Principal Problem: Schizoaffective disorder,  bipolar type ([place]) Diagnosis: Principal Problem:   Schizoaffective disorder, bipolar type ([place]) Active Problems:   Schizoaffective disorder ([place])  Total Time spent with patient: 15 minutes  Past Psychiatric History: Schizoaffective disorder, bipolar-type. H/o mult psych hosp. H/o med non compliance.   Past Medical History:  Past Medical History:  Diagnosis Date   Hernia, inguinal, right    Inguinal hernia    right   Psychiatric illness    Schizophrenia ([place])    History reviewed. No  pertinent surgical history.  Family History:  Family History  Problem Relation Age of Onset   Psychiatric Illness Mother    Hypertension Sister    Family Psychiatric  History: See H&P  Social History:  Social History   Substance and Sexual Activity  Alcohol Use Not Currently     Social History   Substance and Sexual Activity  Drug Use Not Currently   Types: Marijuana, Cocaine   Comment: denies currently    Social History   Socioeconomic History   Marital status: Single    Spouse name: Not on file   Number of children: Not on file   Years of education: 10   Highest education level: Not on file  Occupational History   Not on file  Tobacco Use   Smoking status: Every Day    Packs/day: 0.50    Years: 5.00    Pack years: 2.50    Types: Cigarettes   Smokeless tobacco: Never  Vaping Use   Vaping Use: Never used  Substance and Sexual Activity   Alcohol use: Not Currently   Drug use: Not Currently    Types: Marijuana, Cocaine    Comment: denies currently   Sexual activity: Yes    Birth control/protection: None  Other Topics Concern   Not on file  Social History Narrative   ** Merged History Encounter **       ** Merged History Encounter **       Social Determinants of Health   Financial Resource Strain: Not on file  Food Insecurity: Not on file  Transportation Needs: Not on file  Physical Activity: Not on file  Stress: Not on file  Social Connections: Not on file   Additional Social History:   Sleep: Fair  Appetite:  Fair  Current Medications: Current Facility-Administered Medications  Medication Dose Route Frequency Provider Last Rate Last Admin   acetaminophen (TYLENOL) tablet 650 mg  650 mg Oral Q6H PRN Lindell Spar I, NP   650 mg at 02/01/22 1137   alum & mag hydroxide-simeth (MAALOX/MYLANTA) 200-200-20 MG/5ML suspension 30 mL  30 mL Oral Q4H PRN Lindell Spar I, NP       benztropine (COGENTIN) tablet 0.5 mg  0.5 mg Oral BID Lindon Romp A, NP    0.5 mg at 02/07/22 5027   hydrOXYzine (ATARAX) tablet 25 mg  25 mg Oral TID PRN Prescilla Sours, PA-C   25 mg at 02/06/22 2057   magnesium hydroxide (MILK OF MAGNESIA) suspension 30 mL  30 mL Oral Daily PRN Lindell Spar I, NP       OLANZapine (ZYPREXA) tablet 2.5 mg  2.5 mg Oral Daily Massengill, Nathan, MD   2.5 mg at 02/07/22 0838   OLANZapine (ZYPREXA) tablet 5 mg  5 mg Oral QHS Massengill, Nathan, MD   5 mg at 02/06/22 2058   OLANZapine zydis (ZYPREXA) disintegrating tablet 5 mg  5 mg Oral TID PRN Janine Limbo, MD   5 mg at 02/01/22 1137   OLANZapine  zydis (ZYPREXA) disintegrating tablet 5 mg  5 mg Oral Q8H PRN Massengill, Nathan, MD       And   ziprasidone (GEODON) injection 20 mg  20 mg Intramuscular PRN Massengill, Ovid Curd, MD       propranolol (INDERAL) tablet 20 mg  20 mg Oral BID Massengill, Ovid Curd, MD   20 mg at 02/07/22 2694   risperiDONE (RISPERDAL) tablet 2 mg  2 mg Oral QHS Nkwenti, Doris, NP   2 mg at 02/06/22 2057   traZODone (DESYREL) tablet 50 mg  50 mg Oral QHS PRN Prescilla Sours, PA-C   50 mg at 02/06/22 2057   Lab Results: No results found for this or any previous visit (from the past 48 hour(s)).  Blood Alcohol level:  Lab Results  Component Value Date   ETH <10 01/28/2022   ETH <10 85/46/2703   Metabolic Disorder Labs: Lab Results  Component Value Date   HGBA1C 5.2 01/28/2022   MPG 102.54 01/28/2022   MPG 96.8 10/09/2021   Lab Results  Component Value Date   PROLACTIN 32.5 (H) 05/30/2018   Lab Results  Component Value Date   CHOL 161 12/13/2021   TRIG 81 12/13/2021   HDL 75 12/13/2021   CHOLHDL 2.1 12/13/2021   VLDL 16 12/13/2021   LDLCALC 70 12/13/2021   LDLCALC 96 10/09/2021   Physical Findings: AIMS: Facial and Oral Movements Muscles of Facial Expression: None, normal Lips and Perioral Area: None, normal Jaw: None, normal Tongue: None, normal,Extremity Movements Upper (arms, wrists, hands, fingers): None, normal Lower (legs, knees,  ankles, toes): None, normal, Trunk Movements Neck, shoulders, hips: None, normal, Overall Severity Severity of abnormal movements (highest score from questions above): None, normal Incapacitation due to abnormal movements: None, normal Patient's awareness of abnormal movements (rate only patient's report): No Awareness, Dental Status Current problems with teeth and/or dentures?: No Does patient usually wear dentures?: No  CIWA:    COWS:     Musculoskeletal: Strength & Muscle Tone: within normal limits Gait & Station: normal Patient leans: N/A  Psychiatric Specialty Exam:  Presentation  General Appearance: Appropriate for Environment; Casual; Fairly Groomed  Eye Contact:Good  Speech:Clear and Coherent; Normal Rate  Speech Volume:Normal  Handedness:Right   Mood and Affect  Mood:Euthymic  Affect:Appropriate; Congruent  Thought Process  Thought Processes:Coherent; Goal Directed; Linear  Descriptions of Associations:Intact  Orientation:Full (Time, Place and Person)  Thought Content:Logical  History of Schizophrenia/Schizoaffective disorder:Yes  Duration of Psychotic Symptoms:Greater than six months  Hallucinations:Hallucinations: None Description of Command Hallucinations: NA Description of Auditory Hallucinations: NA Description of Visual Hallucinations: NA  Ideas of Reference:None  Suicidal Thoughts:Suicidal Thoughts: No SI Active Intent and/or Plan: Without Intent; Without Plan; Without Means to Carry Out; Without Access to Means  Homicidal Thoughts:Homicidal Thoughts: No  Sensorium  Memory:Immediate Good; Recent Good; Remote Good  Judgment:Fair  Insight:Fair  Executive Functions  Concentration:Good  Attention Span:Good  Recall:Good  Fund of Knowledge:Fair  Language:Good  Psychomotor Activity  Psychomotor Activity:Psychomotor Activity: Normal Extrapyramidal Side Effects (EPS): -- (NA) AIMS Completed?: No  Assets  Assets:Communication  Skills; Desire for Improvement; Resilience; Physical Health  Sleep  Sleep:Sleep: Good Number of Hours of Sleep: 6.75  Physical Exam: Physical Exam Vitals reviewed.  Constitutional:      General: He is not in acute distress.    Appearance: He is normal weight. He is not toxic-appearing.  Pulmonary:     Effort: Pulmonary effort is normal.  Neurological:     Mental Status: He is alert.  Motor: No weakness.     Gait: Gait normal.   Review of Systems  Constitutional:  Negative for chills and fever.  Cardiovascular:  Negative for chest pain and palpitations.  Neurological:        Restless  Psychiatric/Behavioral:  Positive for hallucinations. The patient is nervous/anxious.   Blood pressure 128/78, pulse 87, temperature 97.8 F (36.6 C), temperature source Oral, resp. rate 16, height 5\' 8"  (1.727 m), weight 69.9 kg, SpO2 100 %. Body mass index is 23.42 kg/m.   Treatment Plan Summary:  ASSESSMENT:   Diagnoses / Active Problems: -Schizoaffective disorder, bipolar type    PLAN: Safety and Monitoring:             -- Involuntary admission to inpatient psychiatric unit for safety, stabilization and treatment             -- Daily contact with patient to assess and evaluate symptoms and progress in treatment             -- Patient's case to be discussed in multi-disciplinary team meeting             -- Observation Level : q15 minute checks             -- Vital signs:  q12 hours             -- Precautions: suicide, elopement, and assault   2. Psychiatric Diagnoses and Treatment:               -Administered invega sustenna LAI 234 mg IM once today (02-05-22) -Continue Risperdal 2 mg qhs  for 7 days, then stop (02-12-22) as pt is receiving invega sustenna LAI today.  -Continue Benztropine 0.5 mg po bid for prevention of eps. -Continue propranolol 20 mg po bid for anxiety/restlessness.  -Continue zyprexa 5 mg tid prn for psychosis (separate PRN order from agitation) -Continue  zyprexa 2.5 mg in the morning & 5 mg at bedtime - for psychosis. Pt's positive symptoms responded well to switching from abilify to zyprexa, but his negative symptoms have worsened since this stopping abilify. We will first maximize treatmetn of positive symptoms, then evaluate switch to abilify or not. Additionally, the pt is sensitive to akathesia, improved since yesterday, which could be worse of abilfy vs zyprexa.   Agitation protocols. -Continue Zyprexa zydis 10 mg po Q 8 hrs prn. & -Lorazepam 1 mg po prn x 1 dose.  & -Geodon 20 mg po IM x 1 dose.   Other prns. Acetaminophen 650 mg po Q 6 hrs prn for fever/pain.  MOM 30 ml po Q daily prn for constipation. Mylanta 30 ml po Q 4 hrs prn for indigestion.   3. Treat health problems as indicated.  4. Develop treatment plan to decrease risk of relapse upon discharge and the need for readmission.  5. Psycho-social education regarding relapse prevention and self care.  6. Health care follow up as needed for medical problems.  7. Review, reconcile, and reinstate any pertinent home medications for other health issues where appropriate. 8. Call for consults with hospitalist for any additional specialty patient care services as needed.    4. Discharge Planning:              -- Social work and case management to assist with discharge planning and identification of hospital follow-up needs prior to discharge             -- Estimated LOS: 5-7 days             --  Discharge Concerns: Need to establish a safety plan; Medication compliance and effectiveness             -- Discharge Goals: Return home with outpatient referrals for mental health follow-up including medication management/psychotherapy  Lindell Spar, NP, pmhnp, fnp-bc 02/07/2022, 3:31 PM Patient ID: Tyler White, male   DOB: 1996-10-15, 26 y.o.   MRN: [id number] Patient ID: Tyler White, male   DOB: Mar 29, 1996, 26 y.o.   MRN: [id number]

## 2022-02-07 NOTE — Progress Notes (Signed)
°   02/07/22 1100  Psych Admission Type (Psych Patients Only)  Admission Status Voluntary  Psychosocial Assessment  Patient Complaints Worrying  Eye Contact Brief  Facial Expression Flat;Worried  Affect Flat  Speech Logical/coherent  Interaction Minimal;Guarded;Cautious  Motor Activity Other (Comment) (WDL)  Appearance/Hygiene Unremarkable  Behavior Characteristics Cooperative  Mood Preoccupied  Aggressive Behavior  Effect No apparent injury  Thought Process  Coherency Concrete thinking  Content WDL  Delusions None reported or observed  Perception WDL  Hallucination None reported or observed  Judgment Limited  Confusion None  Danger to Self  Current suicidal ideation? Denies  Self-Injurious Behavior No self-injurious ideation or behavior indicators observed or expressed   Agreement Not to Harm Self Yes  Description of Agreement Verbal Contract  Danger to Others  Danger to Others None reported or observed

## 2022-02-07 NOTE — Progress Notes (Signed)
°   02/07/22 2000  Psych Admission Type (Psych Patients Only)  Admission Status Voluntary  Psychosocial Assessment  Patient Complaints Suspiciousness  Eye Contact Brief  Facial Expression Flat;Worried  Affect Flat  Speech Logical/coherent  Interaction Minimal;Guarded;Cautious  Motor Activity Other (Comment) (WDL)  Appearance/Hygiene Unremarkable  Behavior Characteristics Cooperative  Mood Preoccupied  Aggressive Behavior  Effect No apparent injury  Thought Process  Coherency Concrete thinking  Content WDL  Delusions None reported or observed  Perception WDL  Hallucination None reported or observed  Judgment Limited  Confusion None  Danger to Self  Current suicidal ideation? Denies  Self-Injurious Behavior No self-injurious ideation or behavior indicators observed or expressed   Agreement Not to Harm Self Yes  Description of Agreement Verbal Contract  Danger to Others  Danger to Others None reported or observed

## 2022-02-07 NOTE — Group Note (Signed)
Recreation Therapy Group Note   Group Topic:Health and Wellness  Group Date: 02/07/2022 Start Time: 1000 End Time: 1025 Facilitators: Victorino Sparrow, LRT,CTRS Location: [address] Dayroom   Goal Area(s) Addresses:  Patient will define components of whole wellness. Patient will verbalize benefit of whole wellness.  Group Description:  Exercise.  LRT discussed the importance of exercise with patients.  LRT led patients in a series of stretches to loosen the muscles.  Patients then took turns leading the group in exercises of their choosing.  LRT explained the exercise will consist of at least 30 minutes and encouraged to get water and take breaks if needed.   Affect/Mood: Flat   Participation Level: None   Participation Quality: None   Behavior: Reserved   Speech/Thought Process: None   Insight: None   Judgement: None   Modes of Intervention: Exercise   Patient Response to Interventions:  Uninterested   Education Outcome:  Acknowledges education and In group clarification offered    Clinical Observations/Individualized Feedback: Pt came in for about 2 minutes and then left.    Plan: Continue to engage patient in RT group sessions 2-3x/week.   Victorino Sparrow, LRT,CTRS  02/07/2022 11:22 AM

## 2022-02-07 NOTE — Group Note (Signed)
Date:  02/07/2022 Time:  4:09 PM  Group Topic/Focus:  Emotional Education:   The focus of this group is to discuss what feelings/emotions are, and how they are experienced.    Participation Level:  Did Not Attend   Tyler White 02/07/2022, 4:09 PM

## 2022-02-07 NOTE — BHH Counselor (Signed)
CSW provided this patient with homeless resources. Patient plans to discharge to the [facility].       Darletta Moll MSW, LCSW Clincal Social Worker  [facility]

## 2022-02-07 NOTE — BHH Suicide Risk Assessment (Addendum)
[facility] Discharge Suicide Risk Assessment   Principal Problem: Schizoaffective disorder, bipolar type [facility]) Discharge Diagnoses: Principal Problem:   Schizoaffective disorder, bipolar type ([place]) Active Problems:   Schizoaffective disorder ([place])   Total Time spent with patient: 20 minutes  This is one of several psychiatric admissions in this [facility] for this 26 year old AA male with hx of Schizoaffective disorder & cocaine use disorder. He is admitted to the [facility] this time around with complain of worsening auditory hallucination telling him to hurt himself. His UDS was positive for cocaine.   During the patient's hospitalization, patient had extensive initial psychiatric evaluation, and follow-up psychiatric evaluations every day.  Psychiatric diagnoses provided upon initial assessment:  Schizoaffective disorder, bipolar type   Diagnosis list was revised to reflect: Schizoaffective disorder, bipolar type  Stimulant use disorder, severe   Patient's psychiatric medications were adjusted on admission:  -start Abilify 5 mg po daily for Schizoaffective disorder. -start Risperdal 1 mg po bid for Schizoaffective disorder. -start Benztropine 0.5 mg po bid for prevention of eps. -start Propranolol 10 mg po bid for anxiety.   During the hospitalization, other adjustments were made to the patient's psychiatric medication regimen:  -risperdal was increased to 4 mg once daily, then Delrae Sawyers was administered on 02-05-22, at which time the risperdal dose was decreased to 2 mg qhs for 7 days then stop (stop on 02-12-22) -Invega sustenna was administered on 02-05-22. Next dose of loading regimen is due on 02-13-22. (At which time risperdal should be stopped) -Abilify was dc due to ineffectiveness and zyprexa was started -zyprexa was started and increased to 2.5 mg qam and 5 mg qhs  -propranolol was increased to 20 mg bid for akathesia and anxiety    Gradually, patient started adjusting to milieu.   Patient's  care was discussed during the interdisciplinary team meeting every day during the hospitalization.  The patient reported some akathesia, which responded to increasing dose of propranolol. He otherwise denied having side effects to prescribed psychiatric medication.  The patient reports their target psychiatric symptoms of psychosis, command AH, SI, and HI, all responded well to the psychiatric medications, and the patient reports overall benefit other psychiatric hospitalization. Supportive psychotherapy was provided to the patient. The patient also participated in regular group therapy while admitted.   Labs were reviewed with the patient, and abnormal results were discussed with the patient.  The patient denied having suicidal thoughts more than 48 hours prior to discharge.  Patient denies having homicidal thoughts.  Patient denies having auditory hallucinations.  Patient denies any visual hallucinations.  Patient denies having paranoid thoughts.  The patient is able to verbalize their individual safety plan to this provider.  It is recommended to the patient to continue psychiatric medications as prescribed, after discharge from the hospital.    It is recommended to the patient to follow up with your outpatient psychiatric provider and PCP.  Discussed with the patient, the impact of alcohol, drugs, tobacco have been there overall psychiatric and medical wellbeing, and total abstinence from substance use was recommended the patient.     Musculoskeletal: Strength & Muscle Tone: within normal limits Gait & Station: normal Patient leans: N/A  Psychiatric Specialty Exam  Presentation  General Appearance: Appropriate for Environment; Casual; Fairly Groomed  Eye Contact:Good  Speech:Clear and Coherent; Normal Rate  Speech Volume:Normal  Handedness:Right   Mood and Affect  Mood:Euthymic  Duration of Depression Symptoms: Less than two weeks  Affect:Appropriate; Congruent;  Constricted   Thought Process  Thought Processes:Linear  Descriptions of Associations:Intact  Orientation:Full (Time, Place and Person)  Thought Content:Logical  History of Schizophrenia/Schizoaffective disorder:Yes  Duration of Psychotic Symptoms:Greater than six months  Hallucinations:Hallucinations: None Description of Command Hallucinations: NA Description of Auditory Hallucinations: NA Description of Visual Hallucinations: NA  Ideas of Reference:None  Suicidal Thoughts:Suicidal Thoughts: No SI Active Intent and/or Plan: Without Intent; Without Plan; Without Means to Carry Out; Without Access to Means  Homicidal Thoughts:Homicidal Thoughts: No   Sensorium  Memory:Immediate Good; Recent Good; Remote Good  Judgment:Fair  Insight:Fair   Executive Functions  Concentration:Fair  Attention Span:Fair  [place]  Language:Good   Psychomotor Activity  Psychomotor Activity:Psychomotor Activity: Restlessness Extrapyramidal Side Effects (EPS): -- (NA) AIMS Completed?: No   Assets  Assets:Communication Skills; Desire for Improvement; Resilience; Physical Health   Sleep  Sleep:Sleep: Good Number of Hours of Sleep: 6.75   Physical Exam: Physical Exam Vitals reviewed.  Constitutional:      General: He is not in acute distress.    Appearance: He is normal weight. He is not toxic-appearing.  Pulmonary:     Effort: Pulmonary effort is normal. No respiratory distress.  Neurological:     Mental Status: He is alert.     Motor: No weakness.     Gait: Gait normal.  Psychiatric:        Mood and Affect: Mood normal.        Behavior: Behavior normal.        Thought Content: Thought content normal.        Judgment: Judgment normal.   Review of Systems  Constitutional:  Negative for chills and fever.  Cardiovascular:  Negative for chest pain and palpitations.  Neurological:  Negative for dizziness, tingling, tremors and headaches.   Psychiatric/Behavioral:  Negative for depression, hallucinations, memory loss, substance abuse and suicidal ideas. The patient is not nervous/anxious and does not have insomnia.   Blood pressure 124/78, pulse 81, temperature 97.8 F (36.6 C), temperature source Oral, resp. rate 16, height 5\' 8"  (1.727 m), weight 69.9 kg, SpO2 100 %. Body mass index is 23.42 kg/m.  Mental Status Per Nursing Assessment::   On Admission:     Demographic factors:  Male, Low socioeconomic status   Loss Factors: NA   Historical Factors: Chronic mental health issues, Cannabis use disorder.   Risk Reduction Factors: Positive therapeutic relationship.  Continued Clinical Symptoms:  Schizoaffective disorder, bipolar type - psychosis has resolved. AH have resolved. Denies SI. Denies HI. Mood is stable. Sleep is stable.    Cognitive Features That Contribute To Risk:  None    Suicide Risk:  Mild:  There are no identifiable suicide plans, no associated intent, mild dysphoria and related symptoms, good self-control (both objective and subjective assessment), few other risk factors, and identifiable protective factors, including available and accessible social support.   [place]. Go to.   Specialty: Behavioral Health Why: Please go to this provider for therapy and medication management services during walk in hours:  Monday through Wednesday from 7:45 am to 11:00 am.  Services are provided on a first come, first served basis. Contact information: [place] Follow up.   Why: A referral for ACTT services has been made on your behalf. Contact information: [place] [address] [phone]  Plan Of Care/Follow-up recommendations:   Activity: as tolerated  Diet: heart healthy  Other: -Follow-up with your outpatient  psychiatric provider / ACT team -instructions on appointment date, time, and address (location) are provided to you in discharge paperwork.  -Take your psychiatric medications as prescribed at discharge - instructions are provided to you in the discharge paperwork -YOU ARE BEING GIVEN CONTACT INFORMATION FOR AN ACT TEAM ([facility] COMMUNITY TREATMENT TEAM) - PLEASE CALL THIS TEAM ASAP AFTER YOU ARE DISCHARGE TO SET UP A TIME FOR AN INTAKE APPOINTMENT.  -YOU ARE BEING GIVEN 7 DAYS OF MEDICATIONS AND ALSO PAPER PRESCRIPTIONS FOR 30 DAYS OF MEDICATION, THAT YOU CAN HAVE FILLED AT ANY PHARMACY   -Follow-up with outpatient primary care doctor and other specialists -for management of chronic medical disease, including: none  -Testing: Follow-up with outpatient provider for abnormal lab results from this admission: none  -Recommend abstinence from alcohol, tobacco, and other illicit drug use at discharge.   -If your psychiatric symptoms recur, worsen, or if you have side effects to your psychiatric medications, call your outpatient psychiatric provider, [phone], [phone] or go to the nearest emergency department.  -If suicidal thoughts recur, call your outpatient psychiatric provider, 911, [phone] or go to the nearest emergency department.   Christoper Allegra, MD 02/07/2022, 6:22 PM

## 2022-02-08 ENCOUNTER — Encounter (HOSPITAL_COMMUNITY): Payer: Self-pay

## 2022-02-08 ENCOUNTER — Other Ambulatory Visit (HOSPITAL_COMMUNITY): Payer: Self-pay

## 2022-02-08 MED ORDER — OLANZAPINE 5 MG PO TABS
5.0000 mg | ORAL_TABLET | Freq: Every day | ORAL | 0 refills | Status: DC
  Filled 2022-02-08: qty 30, 30d supply, fill #0

## 2022-02-08 MED ORDER — BENZTROPINE MESYLATE 0.5 MG PO TABS
0.5000 mg | ORAL_TABLET | Freq: Two times a day (BID) | ORAL | 0 refills | Status: DC
  Filled 2022-02-08: qty 60, 30d supply, fill #0

## 2022-02-08 MED ORDER — HYDROXYZINE HCL 25 MG PO TABS
25.0000 mg | ORAL_TABLET | Freq: Three times a day (TID) | ORAL | 0 refills | Status: DC | PRN
  Filled 2022-02-08: qty 75, 25d supply, fill #0

## 2022-02-08 MED ORDER — PROPRANOLOL HCL 20 MG PO TABS
20.0000 mg | ORAL_TABLET | Freq: Two times a day (BID) | ORAL | 0 refills | Status: DC
Start: 2022-02-08 — End: 2022-04-05
  Filled 2022-02-08: qty 60, 30d supply, fill #0

## 2022-02-08 MED ORDER — PALIPERIDONE PALMITATE ER 156 MG/ML IM SUSY
156.0000 mg | PREFILLED_SYRINGE | Freq: Once | INTRAMUSCULAR | 0 refills | Status: DC
  Filled 2022-02-08: qty 1, 1d supply, fill #0

## 2022-02-08 MED ORDER — PALIPERIDONE PALMITATE ER 156 MG/ML IM SUSY: 156.0000 mg | PREFILLED_SYRINGE | Freq: Once | INTRAMUSCULAR | Status: DC

## 2022-02-08 MED ORDER — PALIPERIDONE PALMITATE ER 234 MG/1.5ML IM SUSY
234.0000 mg | PREFILLED_SYRINGE | Freq: Once | INTRAMUSCULAR | 0 refills | Status: DC
  Filled 2022-02-08: qty 1.5, 1d supply, fill #0

## 2022-02-08 MED ORDER — OLANZAPINE 2.5 MG PO TABS
2.5000 mg | ORAL_TABLET | Freq: Every day | ORAL | 0 refills | Status: DC
  Filled 2022-02-08: qty 30, 30d supply, fill #0

## 2022-02-08 MED ORDER — PALIPERIDONE PALMITATE ER 234 MG/1.5ML IM SUSY: 234.0000 mg | PREFILLED_SYRINGE | Freq: Once | INTRAMUSCULAR | Status: DC

## 2022-02-08 MED ORDER — TRAZODONE HCL 50 MG PO TABS: 50.0000 mg | ORAL_TABLET | Freq: Every evening | ORAL | 0 refills | Status: DC | PRN

## 2022-02-08 MED ORDER — RISPERIDONE 2 MG PO TABS
2.0000 mg | ORAL_TABLET | Freq: Every day | ORAL | 0 refills | Status: DC
  Filled 2022-02-08: qty 5, 5d supply, fill #0

## 2022-02-08 MED ORDER — RISPERIDONE 2 MG PO TABS
2.0000 mg | ORAL_TABLET | Freq: Every day | ORAL | 0 refills | Status: DC
  Filled 2022-02-08: qty 30, 30d supply, fill #0

## 2022-02-08 NOTE — BH IP Treatment Plan (Unsigned)
Interdisciplinary Treatment and Diagnostic Plan Update  02/08/2022 Mister Krahenbuhl MRN: [id number]  Principal Diagnosis: Schizoaffective disorder, bipolar type [facility])  Secondary Diagnoses: Principal Problem:   Schizoaffective disorder, bipolar type ([place]) Active Problems:   Stimulant use disorder   Current Medications:  Current Facility-Administered Medications  Medication Dose Route Frequency Provider Last Rate Last Admin   acetaminophen (TYLENOL) tablet 650 mg  650 mg Oral Q6H PRN Lindell Spar I, NP   650 mg at 02/08/22 0120   alum & mag hydroxide-simeth (MAALOX/MYLANTA) 200-200-20 MG/5ML suspension 30 mL  30 mL Oral Q4H PRN Lindell Spar I, NP       benztropine (COGENTIN) tablet 0.5 mg  0.5 mg Oral BID Lindon Romp A, NP   0.5 mg at 02/08/22 5027   hydrOXYzine (ATARAX) tablet 25 mg  25 mg Oral TID PRN Prescilla Sours, PA-C   25 mg at 02/07/22 2048   magnesium hydroxide (MILK OF MAGNESIA) suspension 30 mL  30 mL Oral Daily PRN Lindell Spar I, NP       OLANZapine (ZYPREXA) tablet 2.5 mg  2.5 mg Oral Daily Massengill, Nathan, MD   2.5 mg at 02/08/22 0829   OLANZapine (ZYPREXA) tablet 5 mg  5 mg Oral QHS Massengill, Ovid Curd, MD   5 mg at 02/07/22 2047   OLANZapine zydis (ZYPREXA) disintegrating tablet 5 mg  5 mg Oral TID PRN Janine Limbo, MD   5 mg at 02/01/22 1137   OLANZapine zydis (ZYPREXA) disintegrating tablet 5 mg  5 mg Oral Q8H PRN Massengill, Ovid Curd, MD       And   ziprasidone (GEODON) injection 20 mg  20 mg Intramuscular PRN Massengill, Ovid Curd, MD       Derrill Memo ON 02/13/2022] paliperidone (INVEGA SUSTENNA) injection 156 mg  156 mg Intramuscular Once Lindell Spar I, NP       [START ON 03/13/2022] paliperidone (INVEGA SUSTENNA) injection 234 mg  234 mg Intramuscular Once Lindell Spar I, NP       propranolol (INDERAL) tablet 20 mg  20 mg Oral BID Massengill, Ovid Curd, MD   20 mg at 02/08/22 7412   risperiDONE (RISPERDAL) tablet 2 mg  2 mg Oral QHS Nkwenti, Doris, NP   2 mg at 02/07/22  2048   traZODone (DESYREL) tablet 50 mg  50 mg Oral QHS PRN Prescilla Sours, PA-C   50 mg at 02/07/22 2048   Current Outpatient Medications  Medication Sig Dispense Refill   benztropine (COGENTIN) 0.5 MG tablet Take 1 tablet (0.5 mg total) by mouth 2 (two) times daily. For prevention of drug induced tremors 60 tablet 0   hydrOXYzine (ATARAX) 25 MG tablet Take 1 tablet (25 mg total) by mouth 3 (three) times daily as needed for anxiety. 75 tablet 0   [START ON 02/09/2022] OLANZapine (ZYPREXA) 2.5 MG tablet Take 1 tablet (2.5 mg total) by mouth daily. For agitation/mood control 30 tablet 0   OLANZapine (ZYPREXA) 5 MG tablet Take 1 tablet (5 mg total) by mouth at bedtime. For mood control 30 tablet 0   [START ON 02/13/2022] paliperidone (INVEGA SUSTENNA) 156 MG/ML SUSY injection Inject 1 mL (156 mg total) into the muscle once for 1 dose. (Due on 02-13-22): For mood control 1 mL 0   [START ON 03/13/2022] paliperidone (INVEGA SUSTENNA) 234 MG/1.5ML SUSY injection Inject 234 mg into the muscle once for 1 dose. (Due on 03-13-22): For mood control 1.5 mL 0   propranolol (INDERAL) 20 MG tablet Take 1 tablet (20 mg total) by mouth  2 (two) times daily. For tremors 60 tablet 0   risperiDONE (RISPERDAL) 2 MG tablet Take 1 tablet (2 mg total) by mouth at bedtime. For mood control 5 tablet 0   traZODone (DESYREL) 50 MG tablet Take 1 tablet (50 mg total) by mouth at bedtime as needed for sleep. 30 tablet 0   PTA Medications: No medications prior to admission.    Patient Stressors: Financial difficulties   Medication change or noncompliance    Patient Strengths: Average or above average intelligence  Motivation for treatment/growth   Treatment Modalities: Medication Management, Group therapy, Case management,  1 to 1 session with clinician, Psychoeducation, Recreational therapy.   Physician Treatment Plan for Primary Diagnosis: Schizoaffective disorder, bipolar type ([place]) Long Term Goal(s): Improvement in  symptoms so as ready for discharge   Short Term Goals: Ability to identify and develop effective coping behaviors will improve Ability to maintain clinical measurements within normal limits will improve Compliance with prescribed medications will improve Ability to identify triggers associated with substance abuse/mental health issues will improve Ability to identify changes in lifestyle to reduce recurrence of condition will improve Ability to verbalize feelings will improve Ability to disclose and discuss suicidal ideas Ability to demonstrate self-control will improve  Medication Management: Evaluate patient's response, side effects, and tolerance of medication regimen.  Therapeutic Interventions: 1 to 1 sessions, Unit Group sessions and Medication administration.  Evaluation of Outcomes: Adequate for Discharge  Physician Treatment Plan for Secondary Diagnosis: Principal Problem:   Schizoaffective disorder, bipolar type ([place]) Active Problems:   Stimulant use disorder  Long Term Goal(s): Improvement in symptoms so as ready for discharge   Short Term Goals: Ability to identify and develop effective coping behaviors will improve Ability to maintain clinical measurements within normal limits will improve Compliance with prescribed medications will improve Ability to identify triggers associated with substance abuse/mental health issues will improve Ability to identify changes in lifestyle to reduce recurrence of condition will improve Ability to verbalize feelings will improve Ability to disclose and discuss suicidal ideas Ability to demonstrate self-control will improve     Medication Management: Evaluate patient's response, side effects, and tolerance of medication regimen.  Therapeutic Interventions: 1 to 1 sessions, Unit Group sessions and Medication administration.  Evaluation of Outcomes: Adequate for Discharge   RN Treatment Plan for Primary Diagnosis: Schizoaffective  disorder, bipolar type ([place]) Long Term Goal(s): Knowledge of disease and therapeutic regimen to maintain health will improve  Short Term Goals: Ability to demonstrate self-control, Ability to participate in decision making will improve, and Ability to verbalize feelings will improve  Medication Management: RN will administer medications as ordered by provider, will assess and evaluate patient's response and provide education to patient for prescribed medication. RN will report any adverse and/or side effects to prescribing provider.  Therapeutic Interventions: 1 on 1 counseling sessions, Psychoeducation, Medication administration, Evaluate responses to treatment, Monitor vital signs and CBGs as ordered, Perform/monitor CIWA, COWS, AIMS and Fall Risk screenings as ordered, Perform wound care treatments as ordered.  Evaluation of Outcomes: Adequate for Discharge   LCSW Treatment Plan for Primary Diagnosis: Schizoaffective disorder, bipolar type ([place]) Long Term Goal(s): Safe transition to appropriate next level of care at discharge, Engage patient in therapeutic group addressing interpersonal concerns.  Short Term Goals: Engage patient in aftercare planning with referrals and resources, Facilitate patient progression through stages of change regarding substance use diagnoses and concerns, and Identify triggers associated with mental health/substance abuse issues  Therapeutic Interventions: Assess for all discharge needs,  1 to 1 time with Education officer, museum, Explore available resources and support systems, Assess for adequacy in community support network, Educate family and significant other(s) on suicide prevention, Complete Psychosocial Assessment, Interpersonal group therapy.  Evaluation of Outcomes: Adequate for Discharge   Progress in Treatment: Attending groups: Yes. Participating in groups: Yes. Taking medication as prescribed: Yes. Toleration medication: Yes. Family/Significant other  contact made: Yes, individual(s) contacted:  2 attempts made to contact brother Patient understands diagnosis: Yes. and No. Discussing patient identified problems/goals with staff: Yes. Medical problems stabilized or resolved: Yes. Denies suicidal/homicidal ideation: Yes. Issues/concerns per patient self-inventory: No. Other: None  New problem(s) identified: No, Describe:  None  New Short Term/Long Term Goal(s): medication stabilization, elimination of SI thoughts, development of comprehensive mental wellness plan.   Patient Goals:  "To get medications and resources"  Discharge Plan or Barriers: Pt will f/u with [facility] for services  Reason for Continuation of Hospitalization: Medication stabilization  Estimated Length of Stay: 3-5 days   Scribe for Treatment Team: Eliott Nine 02/08/2022 2:32 PM

## 2022-02-08 NOTE — Discharge Summary (Signed)
Physician Discharge Summary Note  Patient:  Tyler White is an 26 y.o., male MRN:  [id number] DOB:  Jun 06, 1996 Patient phone:  [phone] (home)  Patient address:   [place] [address],  Total Time spent with patient:  Greater than 30 minutes  Date of Admission:  01/29/2022 Date of Discharge:  02-08-22  Reason for Admission: Worsening auditory hallucination telling him to hurt himself  Principal Problem: Schizoaffective disorder, bipolar type [facility]) Discharge Diagnoses: Principal Problem:   Schizoaffective disorder, bipolar type ([place]) Active Problems:   Stimulant use disorder  Past Psychiatric History: Schizoaffective disorder, Stimulant use disorder.  Past Medical History:  Past Medical History:  Diagnosis Date   Hernia, inguinal, right    Inguinal hernia    right   Psychiatric illness    Schizophrenia ([place])    History reviewed. No pertinent surgical history. Family History:  Family History  Problem Relation Age of Onset   Psychiatric Illness Mother    Hypertension Sister    Family Psychiatric  History: See H&P  Social History:  Social History   Substance and Sexual Activity  Alcohol Use Not Currently     Social History   Substance and Sexual Activity  Drug Use Not Currently   Types: Marijuana, Cocaine   Comment: denies currently    Social History   Socioeconomic History   Marital status: Single    Spouse name: Not on file   Number of children: Not on file   Years of education: 10   Highest education level: Not on file  Occupational History   Not on file  Tobacco Use   Smoking status: Every Day    Packs/day: 0.50    Years: 5.00    Pack years: 2.50    Types: Cigarettes   Smokeless tobacco: Never  Vaping Use   Vaping Use: Never used  Substance and Sexual Activity   Alcohol use: Not Currently   Drug use: Not Currently    Types: Marijuana, Cocaine    Comment: denies currently   Sexual activity: Yes    Birth control/protection:  None  Other Topics Concern   Not on file  Social History Narrative   ** Merged History Encounter **       ** Merged History Encounter **       Social Determinants of Health   Financial Resource Strain: Not on file  Food Insecurity: Not on file  Transportation Needs: Not on file  Physical Activity: Not on file  Stress: Not on file  Social Connections: Not on file   Hospital Course: (Per admission evaluation notes): 26 year old AA male with hx of Schizoaffective disorder & cocaine use disorder. He is known in this [facility] from his previous admissions for worsening psychosis. He was discharged from this [facility] last December after mood stabilization treatments. He was discharged with medication & a referral to an outpatient psychiatric clinic for medication management & routine psychiatric care. Tyler White is known to be non-complaint to his treatment regimen & has not always made his outpatient recommended follow-up care. He is admitted to the [facility] this time around with complain of worsening auditory hallucination telling him to hurt himself. His UDS was positive for cocaine. During this evaluation, Tyler White reports,  "A couple of days ago, I was trying to go to sleep, I saw something on the hallway that looks like human, half-man, half-woman. He was trying to talk to me in a whispering tone. I could not make out what he was saying to me.  I have not been taking my medicines in 6 months. I felt like I don't need the medicines. But I feel like I need it now. I was on Risperdal, Abilify, Cogentin & Propranolol. When I was taking these medicines, they kept me off of drugs. I have been using a little bit of crack, I smoked it. I have been smoking crack for two years. The last time I used crack was before I came in to the hospital. I'm slightly depressed, but right now, I'm feeling the side effects of drugs. I'm not feeling suicidal or homicidal. I just need to get back on my medicines".   Upon the decision to  discharge Tyler White today, he was seen & evaluated by his treatment team for mental health stability. The current laboratory findings were reviewed. The nurses notes & vital signs were reviewed as well. All are stable. At this present time, there are no current mental health or medical issues that should prevent this discharge at this time. Patient is being discharged to continue mental health care & medication management as noted below.   This is one of several psychiatric admissions/discharge summaries from this [facility] for this 26 year old AA male with hx of chronic mental illness, cannabis/cocaine use disorders & multiple psychiatric admissions. He is known in this [facility] for worsening symptoms of Schizophrenia/schizoaffective disorder. Tyler White has been tried on multiple psychotropic medications for his symptoms & it appeared his symptoms has not been able to improve or stabilize & yet, he is known to be non-compliant to his treatment regimen pre-disposing him to relapses/recurrent of symptoms & frequent hospitalizations. And again, the issues with substance use & homelessness has directly/indirectly hindered his ability to maintain mood stability. He was brought to the [facility] this time around for evaluation & treatment for worsening auditory hallucination telling him to hurt himself.  After evaluation of his presenting symptoms this time around & as other admission times, Tyler White was recommended for mood stabilization treatments by his treatment. The medication regimen for his presenting symptoms were discussed & with his consent initiated. He received, stabilized & was discharged on the medications as listed below on his discharge medication lists. He was also enrolled & participated in the group counseling sessions being offered & held on this unit. He learned coping skills. He presented on this admission, no other chronic medical conditions that required treatment & monitoring. He tolerated his treatment regimen  without any adverse effects or reactions reported.  And because of the chronic nature of his psychiatric symptoms, their resistance to treatments in the past & hx of medication non-compliance, Mckinnley was treated, stabilized & discharged on multiple antipsychotic medications (See the discharge medication lists below). This is because he has not been able to achieve symptoms control under an antipsychotic monotherapy. However, the combination of multiple antipsychotic medications seem effective in stabilizing his symptoms warranting this discharge. It will benefit Siraj to continue on these combination antipsychotic therapies as recommended till his symptoms completely subside. And when this happens, he can then be titrated down to an antipsychotic monotherapy to help decrease the chance for development of metabolic syndrome usually associated with use of multiple antipsychotic therapies. This has to be done within the proper evaluation, judgement & discretion of his outpatient provider.  During the course of this hospitalization, the 15-minute checks were adequate to ensure Aden's safety.  Patient did not display any dangerous, violent or suicidal behavior on the unit. He interacted with patients & staff appropriately. He participated appropriately in  the group sessions/therapies. His medications were addressed & adjusted to meet his needs. He was recommended for outpatient follow-up care & medication management including ACT team referral & recommendation upon discharge to assure his continuity of care.  At the time of discharge, patient is not reporting any acute suicidal/homicidal ideations. He currently denies any new issues or concerns. Education and supportive counseling provided throughout his hospital stay & upon discharge.  Today upon his discharge evaluation with the attending psychiatrist, Yostin presents mentally & medically stable. He denies any other specific concerns. He is sleeping well.  His appetite is good. He denies other physical complaints. He denies AH/VH, delusional thoughts or paranoia. He feels that his medications have been helpful & is in agreement to continue his current treatment regimen as recommended. He was able to engage in safety planning including plan to return to [facility] or contact emergency services if he feels unable to maintain his own safety or the safety of others. Pt had no further questions, comments, or concerns. He was given all necessary/contact information for his ACT TEAM to call to schedule an appointment for intake. He left [facility] with all personal belongings in no apparent distress. Transportation as arranged by the Education officer, museum.     Physical Findings: AIMS: Facial and Oral Movements Muscles of Facial Expression: None, normal Lips and Perioral Area: None, normal Jaw: None, normal Tongue: None, normal,Extremity Movements Upper (arms, wrists, hands, fingers): None, normal Lower (legs, knees, ankles, toes): None, normal, Trunk Movements Neck, shoulders, hips: None, normal, Overall Severity Severity of abnormal movements (highest score from questions above): None, normal Incapacitation due to abnormal movements: None, normal Patient's awareness of abnormal movements (rate only patient's report): No Awareness, Dental Status Current problems with teeth and/or dentures?: No Does patient usually wear dentures?: No  CIWA:    COWS:     Musculoskeletal: Strength & Muscle Tone: within normal limits Gait & Station: normal Patient leans: N/A  Psychiatric Specialty Exam:  Presentation  General Appearance: Appropriate for Environment; Casual; Fairly Groomed  Eye Contact:Good  Speech:Clear and Coherent; Normal Rate  Speech Volume:Normal  Handedness:Right   Mood and Affect  Mood:Euthymic  Affect:Appropriate; Congruent; Constricted  Thought Process  Thought Processes:Linear  Descriptions of Associations:Intact  Orientation:Full (Time, Place  and Person)  Thought Content:Logical  History of Schizophrenia/Schizoaffective disorder:Yes  Duration of Psychotic Symptoms:Greater than six months  Hallucinations:Hallucinations: None Description of Command Hallucinations: NA Description of Auditory Hallucinations: NA Description of Visual Hallucinations: NA  Ideas of Reference:None  Suicidal Thoughts:Suicidal Thoughts: No SI Active Intent and/or Plan: Without Intent; Without Plan; Without Means to Carry Out; Without Access to Means  Homicidal Thoughts:Homicidal Thoughts: No  Sensorium  Memory:Immediate Good; Recent Good; Remote Good  Judgment:Fair  Insight:Fair  Executive Functions  Concentration:Fair  Attention Span:Fair  [place]  Language:Good  Psychomotor Activity  Psychomotor Activity:Psychomotor Activity: Restlessness Extrapyramidal Side Effects (EPS): -- (NA) AIMS Completed?: No  Assets  Assets:Communication Skills; Desire for Improvement; Resilience; Physical Health  Sleep  Sleep:Sleep: Good Number of Hours of Sleep: 6.75  Physical Exam: Physical Exam Vitals and nursing note reviewed.  HENT:     Head: Normocephalic.     Nose: Nose normal.     Mouth/Throat:     Pharynx: Oropharynx is clear.  Eyes:     Pupils: Pupils are equal, round, and reactive to light.  Neck:     Comments: Deferred Cardiovascular:     Rate and Rhythm: Normal rate.     Pulses: Normal  pulses.  Pulmonary:     Effort: Pulmonary effort is normal.  Musculoskeletal:        General: Normal range of motion.     Cervical back: Normal range of motion.  Skin:    General: Skin is warm and dry.  Neurological:     General: No focal deficit present.     Mental Status: He is alert and oriented to person, place, and time. Mental status is at baseline.   Review of Systems  Constitutional:  Negative for chills, diaphoresis and fever.  HENT:  Negative for congestion and sore throat.   Eyes:  Negative  for blurred vision.  Respiratory:  Negative for cough, shortness of breath and wheezing.   Cardiovascular:  Negative for chest pain and palpitations.  Gastrointestinal:  Negative for abdominal pain, constipation, diarrhea, heartburn, nausea and vomiting.  Genitourinary:  Negative for dysuria.  Musculoskeletal:  Negative for joint pain and myalgias.  Skin: Negative.   Neurological:  Negative for dizziness, tingling, tremors, sensory change, speech change, focal weakness, seizures, loss of consciousness, weakness and headaches.  Endo/Heme/Allergies:        Allergies: NKDA  Psychiatric/Behavioral:  Positive for hallucinations (Hx of psychosis (Stable on medication).) and substance abuse (Hx. cocaine & THC use.). Negative for depression, memory loss and suicidal ideas. The patient has insomnia (Hx of (Stable on medication).). The patient is not nervous/anxious (Hx of (stable on medication)).   Blood pressure 120/77, pulse 94, temperature 97.6 F (36.4 C), temperature source Oral, resp. rate 16, height 5\' 8"  (1.727 m), weight 69.9 kg, SpO2 100 %. Body mass index is 23.42 kg/m.   Social History   Tobacco Use  Smoking Status Every Day   Packs/day: 0.50   Years: 5.00   Pack years: 2.50   Types: Cigarettes  Smokeless Tobacco Never   Tobacco Cessation:  N/A, patient does not currently use tobacco products  Blood Alcohol level:  Lab Results  Component Value Date   ETH <10 01/28/2022   ETH <10 38/88/2800    Metabolic Disorder Labs:  Lab Results  Component Value Date   HGBA1C 5.2 01/28/2022   MPG 102.54 01/28/2022   MPG 96.8 10/09/2021   Lab Results  Component Value Date   PROLACTIN 32.5 (H) 05/30/2018   Lab Results  Component Value Date   CHOL 161 12/13/2021   TRIG 81 12/13/2021   HDL 75 12/13/2021   CHOLHDL 2.1 12/13/2021   VLDL 16 12/13/2021   LDLCALC 70 12/13/2021   [place] 96 10/09/2021   See Psychiatric Specialty Exam and Suicide Risk Assessment completed by Attending  Physician prior to discharge.  Discharge destination:  Other:  [facility]  Is patient on multiple antipsychotic therapies at discharge:  Yes,   Do you recommend tapering to monotherapy for antipsychotics?  Yes   Has Patient had three or more failed trials of antipsychotic monotherapy by history:  Yes,   Antipsychotic medications that previously failed include:   1.  Invega, ., 2.  Abilify., and 3.  Olanzapine.  Recommended Plan for Multiple Antipsychotic Therapies: Taper to monotherapy as described:   Per the the outpatient psychiatric provider when his symptoms improve or stabilize. This is to prevent metabolic syndrome usually associated with use of multiple antipsychotic medications.  Allergies as of 02/08/2022   No Known Allergies      Medication List     STOP taking these medications    ARIPiprazole 5 MG tablet Commonly known as: ABILIFY       TAKE  these medications      Indication  benztropine 0.5 MG tablet Commonly known as: COGENTIN Take 1 tablet (0.5 mg total) by mouth 2 (two) times daily. For prevention of drug induced tremors What changed: additional instructions  Indication: Extrapyramidal Reaction caused by Medications   hydrOXYzine 25 MG tablet Commonly known as: ATARAX Take 1 tablet (25 mg total) by mouth 3 (three) times daily as needed for anxiety.  Indication: Feeling Anxious   OLANZapine 5 MG tablet Commonly known as: ZYPREXA Take 1 tablet (5 mg total) by mouth at bedtime. For mood control  Indication: Psychosis   OLANZapine 2.5 MG tablet Commonly known as: ZYPREXA Take 1 tablet (2.5 mg total) by mouth daily. For agitation/mood control Start taking on: February 09, 2022  Indication: Agitation/psychosis   paliperidone 156 MG/ML Susy injection Commonly known as: INVEGA SUSTENNA Inject 1 mL (156 mg total) into the muscle once for 1 dose. (Due on 02-13-22): For mood control Start taking on: February 13, 2022  Indication: Schizoaffective Disorder    paliperidone 234 MG/1.5ML Susy injection Commonly known as: INVEGA SUSTENNA Inject 234 mg into the muscle once for 1 dose. (Due on 03-13-22): For mood control Start taking on: March 13, 2022  Indication: Schizoaffective Disorder   propranolol 20 MG tablet Commonly known as: INDERAL Take 1 tablet (20 mg total) by mouth 2 (two) times daily. For tremors What changed:  medication strength how much to take additional instructions  Indication: Feeling Anxious, Neuroleptic-Induced Akathisia   risperiDONE 2 MG tablet Commonly known as: RISPERDAL Take 1 tablet (2 mg total) by mouth at bedtime. For mood control What changed:  medication strength how much to take when to take this additional instructions  Indication: Schizoaffective   traZODone 50 MG tablet Commonly known as: DESYREL Take 1 tablet (50 mg total) by mouth at bedtime as needed for sleep.  Indication: [place]. Go to.   Specialty: Behavioral Health Why: Please go to this provider for therapy and medication management services during walk in hours:  Monday through Wednesday from 7:45 am to 11:00 am.  Services are provided on a first come, first served basis. Contact information: [place] Follow up.   Why: A referral for ACTT services has been made on your behalf. Please call Lars Masson [phone]) to schedule a date and time for intake. Contact information: [place] [address] [phone]                Follow-up recommendations: Activity:  As tolerated Diet: As recommended by your primary care doctor. Keep all scheduled follow-up appointments as recommended.   Comments:  Comments: Patient is recommended to follow-up care on an outpatient basis as noted above. Prescriptions sent to pt's pharmacy of choice at  discharge.   Patient agreeable to plan.   Given opportunity to ask questions.   Appears to feel comfortable with discharge denies any current suicidal or homicidal thought. Patient is also instructed prior to discharge to: Take all medications as prescribed by his/her mental healthcare provider. Report any adverse effects and or reactions from the medicines to his/her outpatient provider promptly. Patient has been instructed & cautioned: To not engage in alcohol and or illegal drug use while on prescription medicines. In the event of worsening symptoms, patient is instructed to call the  crisis hotline, 911 and or go to the nearest ED for appropriate evaluation and treatment of symptoms. To follow-up with his/her primary care provider for your other medical issues, concerns and or health care needs.   Signed: Lindell Spar, NP, pmhnp, fnp-bc 02/08/2022, 11:29 AM

## 2022-02-08 NOTE — Progress Notes (Signed)
°  [facility] Adult Case Management Discharge Plan :  Will you be returning to the same living situation after discharge:  No. Will be discharged to the [facility] At discharge, do you have transportation home?: No. Uber/Lyft to be arranged Do you have the ability to pay for your medications: Yes,  has insurance  Release of information consent forms completed and in the chart;  Patient's signature needed at discharge.  Patient to Follow up at:  [facility]. Go to.   Specialty: Behavioral Health Why: Please go to this provider for therapy and medication management services during walk in hours:  Monday through Wednesday from 7:45 am to 11:00 am.  Services are provided on a first come, first served basis. Contact information: [place] Follow up.   Why: A referral for ACTT services has been made on your behalf. Please call Lars Masson [phone]) to schedule a date and time for intake. Contact information: [place] [address] [phone]                 Next level of care provider has access to [place] and Suicide Prevention discussed: Yes,  with patient     Has patient been referred to the Quitline?: Patient refused referral  Patient has been referred for addiction treatment: Pt. refused referral  Vassie Moselle, LCSW 02/08/2022, 9:47 AM

## 2022-02-08 NOTE — Progress Notes (Signed)
Pt discharged to lobby. Pt was stable and appreciative at that time. All papers, samples and prescriptions were given and valuables returned. Verbal understanding expressed. Denies SI/HI and A/VH. Pt given opportunity to express concerns and ask questions.  

## 2022-02-08 NOTE — Plan of Care (Signed)
Patient showed minimal engagement with peers at completion of recreation therapy group sessions.    Victorino Sparrow, LRT,CTRS

## 2022-02-08 NOTE — Progress Notes (Signed)
°   02/08/22 0515  Sleep  Number of Hours 2.5

## 2022-02-08 NOTE — Group Note (Signed)
LCSW Group Therapy Note  Group Date: 02/08/2022 Start Time: 1100 End Time: 1200   Type of Therapy and Topic:  Group Therapy - Healthy vs Unhealthy Coping Skills  Participation Level:  Did Not Attend   Description of Group The focus of this group was to determine what unhealthy coping techniques typically are used by group members and what healthy coping techniques would be helpful in coping with various problems. Patients were guided in becoming aware of the differences between healthy and unhealthy coping techniques. Patients were asked to identify 2-3 healthy coping skills they would like to learn to use more effectively.  Therapeutic Goals Patients learned that coping is what human beings do all day long to deal with various situations in their lives Patients defined and discussed healthy vs unhealthy coping techniques Patients identified their preferred coping techniques and identified whether these were healthy or unhealthy Patients determined 2-3 healthy coping skills they would like to become more familiar with and use more often. Patients provided support and ideas to each other   Summary of Patient Progress:  Did not attend   Therapeutic Modalities Cognitive [employer], LCSW 02/08/2022  11:41 AM

## 2022-02-08 NOTE — Progress Notes (Signed)
Recreation Therapy Notes  INPATIENT RECREATION TR PLAN  Patient Details Name: Tyler White MRN: [id number] DOB: 1996/05/16 Today's Date: 02/08/2022  Rec Therapy Plan Is patient appropriate for Therapeutic Recreation?: Yes Treatment times per week: about 3 days Estimated Length of Stay: 5-7 days TR Treatment/Interventions: Group participation (Comment)  Discharge Criteria Pt will be discharged from therapy if:: Discharged Treatment plan/goals/alternatives discussed and agreed upon by:: Patient/family  Discharge Summary Short term goals set: See patient care plan Short term goals met: Adequate for discharge Progress toward goals comments: Groups attended Which groups?: Wellness, Other (Comment) (Archivist) Reason goals not met: Patient needed to open up more. Therapeutic equipment acquired: N/A Reason patient discharged from therapy: Discharge from hospital Pt/family agrees with progress & goals achieved: Yes Date patient discharged from therapy: 02/08/22   Victorino Sparrow, Vickki Muff, Samiha Denapoli A 02/08/2022, 12:54 PM

## 2022-02-08 NOTE — Progress Notes (Signed)
Pt was encouraged but didn't attend orientation/goals group.

## 2022-03-14 ENCOUNTER — Ambulatory Visit (HOSPITAL_COMMUNITY)
Admission: EM | Admit: 2022-03-14 | Discharge: 2022-03-15 | Disposition: A | Discharge status: Home / Self Care | Payer: Medicaid Other | Source: Home / Self Care

## 2022-03-14 ENCOUNTER — Encounter (HOSPITAL_COMMUNITY): Payer: Self-pay | Admitting: Nurse Practitioner

## 2022-03-14 ENCOUNTER — Other Ambulatory Visit: Payer: Self-pay

## 2022-03-14 ENCOUNTER — Emergency Department (HOSPITAL_COMMUNITY)
Admission: EM | Admit: 2022-03-14 | Discharge: 2022-03-14 | Disposition: A | Discharge status: Other Acute Inpatient Hospital | Payer: Medicaid Other | Attending: Emergency Medicine | Admitting: Emergency Medicine

## 2022-03-14 ENCOUNTER — Encounter (HOSPITAL_COMMUNITY): Payer: Self-pay

## 2022-03-14 ENCOUNTER — Emergency Department (HOSPITAL_COMMUNITY): Payer: Medicaid Other

## 2022-03-14 DIAGNOSIS — R5383 Other fatigue: Secondary | ICD-10-CM | POA: Diagnosis not present

## 2022-03-14 DIAGNOSIS — R45851 Suicidal ideations: Secondary | ICD-10-CM | POA: Insufficient documentation

## 2022-03-14 DIAGNOSIS — R079 Chest pain, unspecified: Secondary | ICD-10-CM | POA: Diagnosis not present

## 2022-03-14 DIAGNOSIS — F1010 Alcohol abuse, uncomplicated: Secondary | ICD-10-CM

## 2022-03-14 DIAGNOSIS — F250 Schizoaffective disorder, bipolar type: Secondary | ICD-10-CM | POA: Insufficient documentation

## 2022-03-14 DIAGNOSIS — F1490 Cocaine use, unspecified, uncomplicated: Secondary | ICD-10-CM | POA: Insufficient documentation

## 2022-03-14 DIAGNOSIS — F1510 Other stimulant abuse, uncomplicated: Secondary | ICD-10-CM

## 2022-03-14 DIAGNOSIS — F1290 Cannabis use, unspecified, uncomplicated: Secondary | ICD-10-CM

## 2022-03-14 DIAGNOSIS — R443 Hallucinations, unspecified: Secondary | ICD-10-CM

## 2022-03-14 DIAGNOSIS — R45850 Homicidal ideations: Secondary | ICD-10-CM | POA: Diagnosis not present

## 2022-03-14 DIAGNOSIS — F1410 Cocaine abuse, uncomplicated: Secondary | ICD-10-CM

## 2022-03-14 DIAGNOSIS — R441 Visual hallucinations: Secondary | ICD-10-CM | POA: Diagnosis present

## 2022-03-14 DIAGNOSIS — Z79899 Other long term (current) drug therapy: Secondary | ICD-10-CM | POA: Diagnosis not present

## 2022-03-14 DIAGNOSIS — R0602 Shortness of breath: Secondary | ICD-10-CM | POA: Insufficient documentation

## 2022-03-14 DIAGNOSIS — F1590 Other stimulant use, unspecified, uncomplicated: Secondary | ICD-10-CM | POA: Insufficient documentation

## 2022-03-14 DIAGNOSIS — Z20822 Contact with and (suspected) exposure to covid-19: Secondary | ICD-10-CM | POA: Insufficient documentation

## 2022-03-14 LAB — RAPID URINE DRUG SCREEN, HOSP PERFORMED
Amphetamines: POSITIVE — AB
Barbiturates: NOT DETECTED
Benzodiazepines: NOT DETECTED
Cocaine: POSITIVE — AB
Opiates: NOT DETECTED
Tetrahydrocannabinol: POSITIVE — AB

## 2022-03-14 LAB — COMPREHENSIVE METABOLIC PANEL
ALT: 20 U/L (ref 0–44)
AST: 28 U/L (ref 15–41)
Albumin: 3.7 g/dL (ref 3.5–5.0)
Alkaline Phosphatase: 48 U/L (ref 38–126)
Anion gap: 10 (ref 5–15)
BUN: 11 mg/dL (ref 6–20)
CO2: 27 mmol/L (ref 22–32)
Calcium: 9.1 mg/dL (ref 8.9–10.3)
Chloride: 102 mmol/L (ref 98–111)
Creatinine, Ser: 0.86 mg/dL (ref 0.61–1.24)
GFR, Estimated: >60 mL/min (ref >60)
Glucose, Bld: 112 mg/dL — ABNORMAL HIGH (ref 70–99)
Potassium: 3.6 mmol/L (ref 3.5–5.1)
Sodium: 139 mmol/L (ref 135–145)
Total Bilirubin: 0.6 mg/dL (ref 0.3–1.2)
Total Protein: 6.4 g/dL — ABNORMAL LOW (ref 6.5–8.1)

## 2022-03-14 LAB — CBC WITH DIFFERENTIAL/PLATELET
Abs Immature Granulocytes: 0.03 10*3/uL (ref 0.00–0.07)
Basophils Absolute: 0.1 10*3/uL (ref 0.0–0.1)
Basophils Relative: 1 %
Eosinophils Absolute: 0.2 10*3/uL (ref 0.0–0.5)
Eosinophils Relative: 3 %
HCT: 41.3 % (ref 39.0–52.0)
Hemoglobin: 14 g/dL (ref 13.0–17.0)
Immature Granulocytes: 0 %
Lymphocytes Relative: 30 %
Lymphs Abs: 2.4 10*3/uL (ref 0.7–4.0)
MCH: 27.5 pg (ref 26.0–34.0)
MCHC: 33.9 g/dL (ref 30.0–36.0)
MCV: 81.1 fL (ref 80.0–100.0)
Monocytes Absolute: 0.5 10*3/uL (ref 0.1–1.0)
Monocytes Relative: 7 %
Neutro Abs: 4.7 10*3/uL (ref 1.7–7.7)
Neutrophils Relative %: 59 %
Platelets: 293 10*3/uL (ref 150–400)
RBC: 5.09 MIL/uL (ref 4.22–5.81)
RDW: 15.5 % (ref 11.5–15.5)
WBC: 7.9 10*3/uL (ref 4.0–10.5)
nRBC: 0 % (ref 0.0–0.2)

## 2022-03-14 LAB — RESP PANEL BY RT-PCR (FLU A&B, COVID) ARPGX2
Influenza A by PCR: NEGATIVE
Influenza B by PCR: NEGATIVE
SARS Coronavirus 2 by RT PCR: NEGATIVE

## 2022-03-14 LAB — ACETAMINOPHEN LEVEL: Acetaminophen (Tylenol), Serum: <10 ug/mL — ABNORMAL LOW (ref 10–30)

## 2022-03-14 LAB — SALICYLATE LEVEL: Salicylate Lvl: <7 mg/dL — ABNORMAL LOW (ref 7.0–30.0)

## 2022-03-14 LAB — TROPONIN I (HIGH SENSITIVITY): Troponin I (High Sensitivity): 6 ng/L (ref <18)

## 2022-03-14 MED ORDER — OLANZAPINE 5 MG PO TABS
5.0000 mg | ORAL_TABLET | Freq: Every day | ORAL | Status: DC
  Administered 2022-03-14: 5 mg via ORAL
  Filled 2022-03-14: qty 1

## 2022-03-14 MED ORDER — OLANZAPINE 2.5 MG PO TABS
2.5000 mg | ORAL_TABLET | Freq: Every day | ORAL | Status: DC
  Administered 2022-03-14: 2.5 mg via ORAL
  Filled 2022-03-14: qty 1

## 2022-03-14 MED ORDER — LORAZEPAM 1 MG PO TABS: 1.0000 mg | ORAL_TABLET | Freq: Four times a day (QID) | ORAL | Status: DC | PRN

## 2022-03-14 MED ORDER — TRAZODONE HCL 50 MG PO TABS: 50.0000 mg | ORAL_TABLET | Freq: Every evening | ORAL | Status: DC | PRN

## 2022-03-14 MED ORDER — RISPERIDONE 2 MG PO TABS
2.0000 mg | ORAL_TABLET | Freq: Every day | ORAL | Status: DC
  Administered 2022-03-14: 2 mg via ORAL
  Filled 2022-03-14: qty 1

## 2022-03-14 MED ORDER — HYDROXYZINE HCL 25 MG PO TABS: 25.0000 mg | ORAL_TABLET | Freq: Four times a day (QID) | ORAL | Status: DC | PRN

## 2022-03-14 MED ORDER — ACETAMINOPHEN 325 MG PO TABS: 650.0000 mg | ORAL_TABLET | Freq: Four times a day (QID) | ORAL | Status: DC | PRN

## 2022-03-14 MED ORDER — ONDANSETRON 4 MG PO TBDP: 4.0000 mg | ORAL_TABLET | Freq: Four times a day (QID) | ORAL | Status: DC | PRN

## 2022-03-14 MED ORDER — LOPERAMIDE HCL 2 MG PO CAPS: 2.0000 mg | ORAL_CAPSULE | ORAL | Status: DC | PRN

## 2022-03-14 MED ORDER — MAGNESIUM HYDROXIDE 400 MG/5ML PO SUSP: 30.0000 mL | Freq: Every day | ORAL | Status: DC | PRN

## 2022-03-14 MED ORDER — BENZTROPINE MESYLATE 0.5 MG PO TABS
0.5000 mg | ORAL_TABLET | Freq: Two times a day (BID) | ORAL | Status: DC
  Administered 2022-03-14 (×2): 0.5 mg via ORAL
  Filled 2022-03-14 (×2): qty 1

## 2022-03-14 MED ORDER — HYDROXYZINE HCL 25 MG PO TABS: 25.0000 mg | ORAL_TABLET | Freq: Three times a day (TID) | ORAL | Status: DC | PRN

## 2022-03-14 MED ORDER — PROPRANOLOL HCL 20 MG PO TABS
20.0000 mg | ORAL_TABLET | Freq: Two times a day (BID) | ORAL | Status: DC
  Administered 2022-03-14 (×2): 20 mg via ORAL
  Filled 2022-03-14 (×2): qty 1

## 2022-03-14 MED ORDER — ADULT MULTIVITAMIN W/MINERALS CH
1.0000 | ORAL_TABLET | Freq: Every day | ORAL | Status: DC
  Administered 2022-03-15: 1 via ORAL
  Filled 2022-03-14: qty 1

## 2022-03-14 MED ORDER — OLANZAPINE 2.5 MG PO TABS
2.5000 mg | ORAL_TABLET | Freq: Every day | ORAL | Status: DC
  Administered 2022-03-15: 2.5 mg via ORAL
  Filled 2022-03-14: qty 1

## 2022-03-14 MED ORDER — THIAMINE HCL 100 MG PO TABS
100.0000 mg | ORAL_TABLET | Freq: Every day | ORAL | Status: DC
  Administered 2022-03-15: 100 mg via ORAL
  Filled 2022-03-14: qty 1

## 2022-03-14 MED ORDER — ALUM & MAG HYDROXIDE-SIMETH 200-200-20 MG/5ML PO SUSP: 30.0000 mL | ORAL | Status: DC | PRN

## 2022-03-14 MED ORDER — PROPRANOLOL HCL 20 MG PO TABS
20.0000 mg | ORAL_TABLET | Freq: Two times a day (BID) | ORAL | Status: DC
  Administered 2022-03-14: 20 mg via ORAL
  Filled 2022-03-14 (×2): qty 1

## 2022-03-14 NOTE — ED Provider Notes (Signed)
Behavioral Health Admission H&P ?(FBC & OBS) ? ?Date: 03/15/22 ?Patient Name: Tyler White ?MRN: [id number] ?Chief Complaint:  ?Chief Complaint  ?Patient presents with  ? Suicidal  ?   ? ?Diagnoses:  ?Final diagnoses:  ?Schizoaffective disorder, bipolar type ([place])  ?Alcohol abuse  ?Marijuana use  ?Cocaine use  ?Methamphetamine use ([place])  ? ? ?HPI: Tyler White is a 26 y.o. male with a history of schizoaffective disorder-bipolar type, alcohol use, marijuana use, cocaine use, and methamphetamine use who presented to [facility] due to AVH, cocaine use, and SI. He was recommended for transfer to [facility] for continuous assessment.  ? ?On evaluation, patient is alert and oriented x4.  He is cooperative.  Eye contact is fair.  Speech is clear and coherent.  His responses are brief.  He reports his mood is depressed.  His affect is blunt.  He appears restless.  His thought processes coherent.  His thought content is logical.  He denies auditory hallucinations.  He reports intermittent visual hallucinations of shadows.  No indication that he is responding to internal stimuli.  He denies paranoia.  No delusions elicited during this assessment.  He denies current suicidal ideations.  States that he has suicidal ideations at times.  He denies homicidal ideations.  He reports daily use of marijuana.  He reports using alcohol every other day.  He does not provide a response when asked about quantity.  He reports cocaine use every other day.  Reports that he recently started using methamphetamines.  UDS in the ED positive for cocaine, amphetamines, and THC.  BAL was not performed. ? ?Patient was inpatient at [facility] 01/29/2022 to 02/08/2022.  He was discharged on olanzapine 5 mg nightly, olanzapine 2-1/2 mg daily, propanolol 20 mg twice daily, trazodone 50 mg nightly as needed, hydroxyzine 25 mg 3 times daily, benztropine 0.5 mg twice daily.  He was started on paliperidone LAI and was to receive paliperidone 234 mg injection on March 13, 2022.  Patient states that he has not taken any medications since discharge and states that he has not received the second paliperidone injection. ? ?Patient was scheduled to follow up with [facility] for ACTT intake assessment. Patient reports that he did not follow up.  ? ?Patient was also inpatient at [facility] 11/2021, 09/2021, 03/2021,10/2020, 04/2018, and 10/2017.  He has several inpatient admissions to other facilities. ? ?PHQ 2-9:  ?Tyler White ED from 03/11/2021 in [place]  ?Thoughts that you would be better off dead, or of hurting yourself in some way Several days  ?PHQ-9 Total Score 17  ? ?  ?  ?Tyler White ED from 03/14/2022 in [facility] ?Most recent reading at 03/15/2022  1:38 AM ED from 03/14/2022 in [place] ?Most recent reading at 03/14/2022 11:09 AM Admission (Discharged) from 01/29/2022 in [place] [facility] ?Most recent reading at 01/29/2022  2:53 PM  ?C-SSRS RISK CATEGORY Moderate Risk Moderate Risk High Risk  ? ?  ?  ? ?Total Time spent with patient: 15 minutes ? ?Musculoskeletal  ?Strength & Muscle Tone: within normal limits ?Gait & Station: normal ?Patient leans: N/A ? ?Psychiatric Specialty Exam  ?Presentation ?General Appearance: Appropriate for Environment; Casual ? ?Eye Contact:Fair ? ?Speech:Clear and Coherent; Normal Rate ? ?Speech Volume:Normal ? ?Handedness:Right ? ? ?Mood and Affect  ?Mood:Depressed ? ?Affect:Blunt ? ? ?Thought Process  ?Thought Processes:Coherent ? ?Descriptions of Associations:Intact ? ?Orientation:Full (Time, Place and  Person) ? ?Thought Content:Logical ? Diagnosis of Schizophrenia or Schizoaffective disorder in past: Yes ? Duration of Psychotic Symptoms: Greater than six months ? ?Hallucinations:Hallucinations: Visual ?Description of Visual Hallucinations: shadows ? ?Ideas of Reference:None ? ?Suicidal  Thoughts:Suicidal Thoughts: No ? ?Homicidal Thoughts:Homicidal Thoughts: No ? ? ?Sensorium  ?Memory:Immediate Good; Recent Good; Remote Good ? ?Judgment:Fair ? ?Insight:Fair ? ? ?Executive Functions  ?Concentration:Fair ? ?Attention Span:Fair ? ?Recall:Good ? ?[place]ore ? ?Language:Good ? ? ?Psychomotor Activity  ?Psychomotor Activity:Psychomotor Activity: Normal ? ? ?Assets  ?Assets:Desire for Improvement; Physical Health ? ? ?Sleep  ?Sleep:Sleep: Good ? ? ?Nutritional Assessment (For OBS and FBC admissions only) ?Has the patient had a weight loss or gain of 10 pounds or more in the last 3 months?: No ?Has the patient had a decrease in food intake/or appetite?: No ?Does the patient have dental problems?: No ?Does the patient have eating habits or behaviors that may be indicators of an eating disorder including binging or inducing vomiting?: No ?Has the patient recently lost weight without trying?: 0 ?Has the patient been eating poorly because of a decreased appetite?: 0 ?Malnutrition Screening Tool Score: 0 ? ? ? ?Physical Exam ?Constitutional:   ?   General: He is not in acute distress. ?   Appearance: He is not ill-appearing, toxic-appearing or diaphoretic.  ?HENT:  ?   Head: Normocephalic.  ?   Right Ear: External ear normal.  ?   Left Ear: External ear normal.  ?Eyes:  ?   Pupils: Pupils are equal, round, and reactive to light.  ?Cardiovascular:  ?   Rate and Rhythm: Normal rate.  ?Pulmonary:  ?   Effort: Pulmonary effort is normal. No respiratory distress.  ?Musculoskeletal:     ?   General: Normal range of motion.  ?Skin: ?   General: Skin is warm and dry.  ?Neurological:  ?   Mental Status: He is alert and oriented to person, place, and time.  ?Psychiatric:     ?   Mood and Affect: Mood is depressed.     ?   Speech: Speech normal.     ?   Behavior: Behavior is cooperative.     ?   Thought Content: Thought content is not paranoid or delusional. Thought content does not include homicidal or  suicidal ideation. Thought content does not include suicidal plan.  ? ?Review of Systems  ?Constitutional:  Negative for chills, diaphoresis, fever, malaise/fatigue and weight loss.  ?HENT:  Negative for congestion.   ?Respiratory:  Negative for cough and shortness of breath.   ?Cardiovascular:  Negative for chest pain and palpitations.  ?Gastrointestinal:  Negative for diarrhea, nausea and vomiting.  ?Neurological:  Negative for dizziness and seizures.  ?Psychiatric/Behavioral:  Positive for depression, hallucinations, substance abuse and suicidal ideas. Negative for memory loss. The patient is nervous/anxious and has insomnia.   ?All other systems reviewed and are negative. ? ?Blood pressure 121/75, pulse 70, temperature 98.9 ?F (37.2 ?C), temperature source Oral, resp. rate 18, SpO2 100 %. There is no height or weight on file to calculate BMI. ? ?Past Psychiatric History: Patient was inpatient at [facility] 01/29/2022 to 02/08/2022.  He was discharged on olanzapine 5 mg nightly, olanzapine 2-1/2 mg daily, propanolol 20 mg twice daily, trazodone 50 mg nightly as needed, hydroxyzine 25 mg 3 times daily, benztropine 0.5 mg twice daily.  He was started on paliperidone LAI and was to receive paliperidone 234 mg injection on March 13, 2022.   ? ?Patient was  also inpatient at [place] 11/2021, 09/2021, 03/2021,10/2020, 04/2018, and 10/2017.  He has several inpatient admissions to other facilities. ? ?Is the patient at risk to self? No  ?Has the patient been a risk to self in the past 6 months? Yes .    ?Has the patient been a risk to self within the distant past? No   ?Is the patient a risk to others? No   ?Has the patient been a risk to others in the past 6 months? No   ?Has the patient been a risk to others within the distant past? No  ? ?Past Medical History:  ?Past Medical History:  ?Diagnosis Date  ? Hernia, inguinal, right   ? Inguinal hernia   ? right  ? Psychiatric illness   ? Schizophrenia ([place])   ? History reviewed.  No pertinent surgical history. ? ?Family History:  ?Family History  ?Problem Relation Age of Onset  ? Psychiatric Illness Mother   ? Hypertension Sister   ? ? ?Social History:  ?Social History  ? ?Socioeconom

## 2022-03-14 NOTE — ED Provider Notes (Signed)
?[facility] ?Provider Note ? ? ?CSN: [id number] ?Arrival date & time: 03/14/22  0602 ? ?  ? ?History ? ?Chief Complaint  ?Patient presents with  ? Hallucinations  ? ? ?Tyler White is a 26 y.o. male. ? ?Tyler White is a 26 year-old-male with a pertinent history of substance induced mood disorder, polysubstance abuse, and suicidal ideation who presents to the ED for concerns of visual and auditory hallucination after cocaine use. Patient used "$7 dollars of cocaine" this morning around 1:00 AM and subsequently experienced chest pain, shortness of breath, and auditory/visual hallucinations. Chest pain has subsided but patient endorses continued shortness of breath and hallucinations. He also admits to suicidal ideation with no plan and homicidal ideation with no specific person or plan. Patient endorses fatigue but denies any fall, trauma, headache, chest pain, cough, abdominal pain, N/V/D. He states that he has not taken his anti-psychotic medications at all.  No other acute medical complaints. ? ?The history is provided by the patient and medical records.  ? ?  ? ?Home Medications ?Prior to Admission medications   ?Medication Sig Start Date End Date Taking? Authorizing Provider  ?benztropine (COGENTIN) 0.5 MG tablet Take 1 tablet (0.5 mg total) by mouth 2 (two) times daily. For prevention of drug induced tremors 02/08/22   Lindell Spar I, NP  ?hydrOXYzine (ATARAX) 25 MG tablet Take 1 tablet (25 mg total) by mouth 3 (three) times daily as needed for anxiety. 02/08/22   Lindell Spar I, NP  ?OLANZapine (ZYPREXA) 2.5 MG tablet Take 1 tablet (2.5 mg total) by mouth daily. For agitation/mood control 02/09/22   Lindell Spar I, NP  ?OLANZapine (ZYPREXA) 5 MG tablet Take 1 tablet (5 mg total) by mouth at bedtime. For mood control 02/08/22   Lindell Spar I, NP  ?paliperidone (INVEGA SUSTENNA) 156 MG/ML SUSY injection Inject 1 mL (156 mg total) into the muscle once for 1 dose. (Due on  02-13-22): For mood control 02/13/22 02/13/22  Lindell Spar I, NP  ?paliperidone (INVEGA SUSTENNA) 234 MG/1.5ML SUSY injection Inject 234 mg into the muscle once for 1 dose. (Due on 03-13-22): For mood control 03/13/22 03/13/22  Lindell Spar I, NP  ?propranolol (INDERAL) 20 MG tablet Take 1 tablet (20 mg total) by mouth 2 (two) times daily. For tremors 02/08/22   Lindell Spar I, NP  ?risperiDONE (RISPERDAL) 2 MG tablet Take 1 tablet (2 mg total) by mouth at bedtime. For mood control 02/08/22   Lindell Spar I, NP  ?traZODone (DESYREL) 50 MG tablet Take 1 tablet (50 mg total) by mouth at bedtime as needed for sleep. 02/08/22   Lindell Spar I, NP  ?gabapentin (NEURONTIN) 400 MG capsule Take 1 capsule (400 mg total) by mouth 3 (three) times daily. ?Patient not taking: Reported on 06/09/2021 04/30/21 06/10/21  Ethelene Hal, NP  ?   ? ?Allergies    ?Patient has no known allergies.   ? ?Review of Systems   ?Review of Systems  ?Constitutional:  Negative for chills and fever.  ?HENT: Negative.    ?Respiratory:  Positive for shortness of breath. Negative for cough.   ?Cardiovascular:  Positive for chest pain.  ?Gastrointestinal:  Negative for abdominal pain, nausea and vomiting.  ?Genitourinary:  Negative for dysuria.  ?Musculoskeletal:  Negative for arthralgias and myalgias.  ?Skin:  Negative for color change, rash and wound.  ?Neurological:  Negative for dizziness, syncope and light-headedness.  ?Psychiatric/Behavioral:  Positive for hallucinations and suicidal ideas.   ?All other  systems reviewed and are negative. ? ?Physical Exam ?Updated Vital Signs ?BP 135/70 (BP Location: Right Arm)   Pulse 69   Temp 98.3 ?F (36.8 ?C) (Oral)   Resp 17   SpO2 100%  ?Physical Exam ?Vitals and nursing note reviewed.  ?Constitutional:   ?   General: He is not in acute distress. ?   Appearance: Normal appearance. He is well-developed. He is not ill-appearing or diaphoretic.  ?HENT:  ?   Head: Normocephalic and atraumatic.  ?Eyes:  ?    General:     ?   Right eye: No discharge.     ?   Left eye: No discharge.  ?Cardiovascular:  ?   Rate and Rhythm: Normal rate and regular rhythm.  ?   Pulses: Normal pulses.  ?   Heart sounds: Normal heart sounds.  ?Pulmonary:  ?   Effort: Pulmonary effort is normal. No respiratory distress.  ?   Breath sounds: Normal breath sounds. No wheezing or rales.  ?   Comments: Respirations equal and unlabored, patient able to speak in full sentences, lungs clear to auscultation bilaterally  ?Abdominal:  ?   General: Bowel sounds are normal. There is no distension.  ?   Palpations: Abdomen is soft. There is no mass.  ?   Tenderness: There is no abdominal tenderness. There is no guarding.  ?   Comments: Abdomen soft, nondistended, nontender to palpation in all quadrants without guarding or peritoneal signs  ?Musculoskeletal:     ?   General: No deformity.  ?   Cervical back: Neck supple.  ?Skin: ?   General: Skin is warm and dry.  ?   Capillary Refill: Capillary refill takes less than 2 seconds.  ?Neurological:  ?   Mental Status: He is alert and oriented to person, place, and time.  ?   Coordination: Coordination normal.  ?   Comments: Speech is clear, able to follow commands ?Moves extremities without ataxia, coordination intact  ?Psychiatric:     ?   Attention and Perception: He perceives auditory and visual hallucinations.     ?   Mood and Affect: Mood is depressed. Affect is blunt.     ?   Speech: He is noncommunicative.     ?   Behavior: Behavior is withdrawn. Behavior is cooperative.     ?   Thought Content: Thought content includes homicidal and suicidal ideation. Thought content does not include homicidal or suicidal plan.     ?   Judgment: Judgment is impulsive.  ?   Comments: Reports auditory and visual hallucinations but is not actively responding to internal stimuli, SI and HI without specific plan.  ? ? ?ED Results / Procedures / Treatments   ?Labs ?(all labs ordered are listed, but only abnormal results are  displayed) ?Labs Reviewed  ?COMPREHENSIVE METABOLIC PANEL - Abnormal; Notable for the following components:  ?    Result Value  ? Glucose, Bld 112 (*)   ? Total Protein 6.4 (*)   ? All other components within normal limits  ?ACETAMINOPHEN LEVEL - Abnormal; Notable for the following components:  ? Acetaminophen (Tylenol), Serum <10 (*)   ? All other components within normal limits  ?SALICYLATE LEVEL - Abnormal; Notable for the following components:  ? Salicylate Lvl <9.2 (*)   ? All other components within normal limits  ?RAPID URINE DRUG SCREEN, HOSP PERFORMED - Abnormal; Notable for the following components:  ? Cocaine POSITIVE (*)   ? Amphetamines POSITIVE (*)   ?  Tetrahydrocannabinol POSITIVE (*)   ? All other components within normal limits  ?RESP PANEL BY RT-PCR (FLU A&B, COVID) ARPGX2  ?CBC WITH DIFFERENTIAL/PLATELET  ?TROPONIN I (HIGH SENSITIVITY)  ? ? ?EKG ?None ? ?Radiology ?DG Chest 2 View ? ?Result Date: 03/14/2022 ?CLINICAL DATA:  Chest pain. EXAM: CHEST - 2 VIEW COMPARISON:  11/24/2013 FINDINGS: Lungs are clear. Heart and mediastinum are within normal limits. Few densities in the left lower chest probably represent overlying structures. The trachea is midline. Negative for a pneumothorax. Lucency extending through the anterior aspect of the left third rib is indeterminate. Otherwise, the bony thorax is intact. IMPRESSION: 1. No acute cardiopulmonary disease. 2. Indeterminate lucency involving the anterior left third rib. Fracture is thought to be unlikely at this site but recommend clinical correlation in this area. Electronically Signed   By: Markus Daft M.D.   On: 03/14/2022 07:43   ? ?Procedures ?Procedures  ? ? ?Medications Ordered in ED ?Medications - No data to display ? ?ED Course/ Medical Decision Making/ A&P ?  ?                        ?Medical Decision Making ? ?This patient presents to the ED for concern of chest pain, shortness of breath and hallucinations after using cocaine as well as  suicidal and homicidal ideations, this involves an extensive number of treatment options, and is a complaint that carries with it a high risk of complications and morbidity.  The differential diagnosis includes acut

## 2022-03-14 NOTE — ED Notes (Signed)
Pt doing TTS assessment  ?

## 2022-03-14 NOTE — ED Notes (Signed)
Pt refused EKG.

## 2022-03-14 NOTE — ED Notes (Signed)
Pt changed into purple scrubs 

## 2022-03-14 NOTE — ED Notes (Signed)
Patient transported to X-ray 

## 2022-03-14 NOTE — ED Notes (Signed)
Pt care taken, resting, no complaints at this time 

## 2022-03-14 NOTE — BH Assessment (Addendum)
Comprehensive Clinical Assessment (CCA) Note ? ?03/14/2022 ?Tyler White ?[id number] ? ?Chief Complaint:  ?Chief Complaint  ?Patient presents with  ? Hallucinations  ? 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 ?[place] ED from 03/14/2022 in [place] ?Most recent reading at 03/14/2022 11:09 AM Admission (Discharged) from 01/29/2022 in [place] [facility] ?Most recent reading at 01/29/2022  2:53 PM ED from 01/29/2022 in [facility] ?Most recent reading at 01/29/2022  2:25 AM  ?C-SSRS RISK CATEGORY Moderate Risk High Risk High Risk  ? ?  ? ? ? ?The patient demonstrates the following risk factors for suicide: Chronic risk factors for suicide include: psychiatric disorder of Schizoaffective disorder, bipolar type, substance use disorder, and previous suicide attempts overdosing . Acute risk factors for suicide include: unemployment and social withdrawal/isolation. Protective factors for this patient include: positive social support, positive therapeutic relationship, responsibility to others (children, family), coping skills, and hope for the future. Considering these factors, the overall suicide risk at this point appears to be moderate. Patient is not appropriate for outpatient follow up. ? ? ?Disposition ?Tyler Lot NP, recommends; restarted medication, continued observation and stabilization and to be reassessed by psychiatry at the [facility]. Disposition discuss with Barrister's clerk.  RN to discuss disposition with EDP. ? ?Tyler White is a 26 year old single male who presents voluntarily to [facility] via [place] and unaccompanied.  Pt reports SI without a plan, "if I don't go to [facility], I am going to hurt myself or someone else".  Pt denies HI. Pt reports that he is hearing voices ,  "the voices are inside and they are asking me for money".  Pt reports paranoia, "I feel that the police is trying to get me".  Pt acknowledged the following symptoms, isolation, fatigue, anxious, racing thoughts, reckless, and fatigue.  Pt reports that he is sleeping three to four hours during the day. Pt reports that he is eating once a day.  Pt says he has been drinking beer; also reports that he used $7.00 worth of Crack Cocaine. Pt UDS is positive for cannabis and cocaine.  Pt admits to smoking ten cigarettes daily. ? ?Pt unable to identify a primary stressor.  Pt reports that he live alone, unable to identify place or location. Pt reports that he is unemployed; also, reports that no longer connected to his ACT Team Shirlee Limerick). Pt unable to identify a support person.  Pt reports family history of substance used; also, reports family history of mental illness. Pt denies any history of abuse or trauma.  Pt denies any current legal problems.  Pt denies any guns or weapons in his possession. ? ?Pt says he is not currently receiving outpatient therapy; also, reports that he is not currently taking prescribed medication. Pt reports one previous inpatient psychiatric hospitalization in February 2023. ? ?Pt is dressed in scrubs, alert, oriented x 4 with slow and soft speech.  Pt presents restless motor behavior.  Eye contact is fleeting.  Pt's mood is irritable, and affect is anxious. Thought process is coherent.  Pt's insight is poor and judgement is impaired.  There is no indication Pt is currently responding to internal stimuli or experiencing delusional thought content.  Pt was cooperative throughout assessment. ? ? ? ? ? ?CCA Screening, Triage and Referral (STR) ? ?Patient Reported Information ?How did you hear about [place]? -- (  [facility]) ? ?What Is the Reason for Your Visit/Call Today? SI, Hallucinations ? ?How Long Has This Been Causing You Problems? <Week ? ?What Do You Feel Would Help You the Most Today? Alcohol or Drug  Use Treatment; Treatment for Depression or other mood problem ? ? ?Have You Recently Had Any Thoughts About Hurting Yourself? Yes ? ?Are You Planning to Commit Suicide/Harm Yourself At This time? No ? ? ?Have you Recently Had Thoughts About [place]? Yes ? ?Are You Planning to Harm Someone at This Time? No ? ?Explanation: No data recorded ? ?Have You Used Any Alcohol or Drugs in the Past 24 Hours? Yes ? ?How Long Ago Did You Use Drugs or Alcohol? No data recorded ?What Did You Use and How Much? Beer-2, Crack Cocaine- $7.00 ? ? ?Do You Currently Have a Therapist/Psychiatrist? No ? ?Name of Therapist/Psychiatrist: [facility] ? ? ?Have You Been Recently Discharged From Any Office Practice or Programs? No ? ?Explanation of Discharge From Practice/Program: No data recorded ? ?  ?CCA Screening Triage Referral Assessment ?Type of Contact: Tele-Assessment ? ?Telemedicine Service Delivery: Telemedicine service delivery: This service was provided via telemedicine using a 2-way, interactive audio and video technology ? ?Is this Initial or Reassessment? Initial Assessment ? ?Date Telepsych consult ordered in [facility]:  03/14/22 ? ?Time Telepsych consult ordered in [facility]:  1718 ? ?Location of Assessment: [place] ED ? ?Provider Location: [facility] Services ? ? ?Collateral Involvement: No Collateral involved. ? ? ?Does Patient Have a Stage manager Guardian? No data recorded ?Name and Contact of Legal Guardian: No data recorded ?If Minor and Not Living with Parent(s), Who has Custody? n/a ? ?Is CPS involved or ever been involved? Never ? ?Is APS involved or ever been involved? Never ? ? ?Patient Determined To Be At Risk for Harm To Self or Others Based on Review of Patient Reported Information or Presenting Complaint? Yes, for Self-Harm ? ?Method: No data recorded ?Availability of Means: No data recorded ?Intent: No data recorded ?Notification Required: No data recorded ?Additional Information for Danger to Others  Potential: No data recorded ?Additional Comments for Danger to Others Potential: No data recorded ?Are There Guns or Other Weapons in [place]? No data recorded ?Types of Guns/Weapons: No data recorded ?Are These Weapons Safely Secured?                            No data recorded ?Who Could Verify You Are Able To Have These Secured: No data recorded ?Do You Have any Outstanding Charges, Pending Court Dates, Parole/Probation? No data recorded ?Contacted To Inform of Risk of Harm To Self or Others: Law Enforcement ? ? ? ?Does Patient Present under Involuntary Commitment? No ? ?IVC Papers Initial File Date: No data recorded ? ?[place] of Residence: [place] Arg[place] ? ? ?Patient Currently Receiving the Following Services: ACTT (Assertive Community Treatment) ? ? ?Determination of Need: Urgent (48 hours) ? ? ?Options For Referral: [facility]; Facility-Based Crisis ? ? ? ? ?CCA Biopsychosocial ?Patient Reported Schizophrenia/Schizoaffective Diagnosis in Past: Yes ? ? ?Strengths: pt denied ? ? ?Mental Health Symptoms ?Depression:   ?Sleep (too much or little); Fatigue; Hopelessness ?  ?Duration of Depressive symptoms:  ?Duration of Depressive Symptoms: Less than two weeks ?  ?Mania:   ?None ?  ?Anxiety:    ?Sleep ?  ?Psychosis:   ?Hallucinations ?  ?Duration of Psychotic symptoms:  ?Duration of Psychotic Symptoms: Greater than six months ?  ?  Trauma:   ?None ?  ?Obsessions:   ?None ?  ?Compulsions:   ?None ?  ?Inattention:   ?None ?  ?Hyperactivity/Impulsivity:   ?None ?  ?Oppositional/Defiant Behaviors:   ?None ?  ?Emotional Irregularity:   ?None ?  ?Other Mood/Personality Symptoms:   ?Pt was flat and distracted during TTS consult. ?  ? ?Mental Status Exam ?Appearance and self-care  ?Stature:   ?Average ?  ?Weight:   ?Average weight ?  ?Clothing:   ?-- (pt was in hospital scrubs.) ?  ?Grooming:   ?Normal ?  ?Cosmetic use:   ?None ?  ?Posture/gait:   ?Rigid ?  ?Motor activity:   ?Restless ?  ?Sensorium  ?Attention:   ?Confused;  Unaware ?  ?Concentration:   ?Scattered ?  ?Orientation:   ?Object; Person; Place; Situation ?  ?Recall/memory:   ?Normal ?  ?Affect and Mood  ?Affect:   ?Flat ?  ?Mood:   ?Irritable; Depressed ?  ?Relating

## 2022-03-14 NOTE — ED Triage Notes (Addendum)
Pt arrived via [facility] for cc of auditory and visual hallucinations post cocaine use around 1:00 this morning. Pt reported using "7 dollars worth" of cocaine, but believes to may have not been just cocaine. Pt calm and cooperative enroute.  ? ?EMS Vitals  ?BP 140/84 ?HR 80 ?SPO2 100% ?RR 16 ? ?

## 2022-03-14 NOTE — ED Notes (Signed)
Pt is calm and agreeable to med administration. Pt is sleeping throughout the day and remains quiet and to hisself.  ?

## 2022-03-14 NOTE — ED Notes (Signed)
ED Provider at bedside. 

## 2022-03-15 ENCOUNTER — Other Ambulatory Visit (HOSPITAL_COMMUNITY): Payer: Self-pay

## 2022-03-15 DIAGNOSIS — Z20822 Contact with and (suspected) exposure to covid-19: Secondary | ICD-10-CM | POA: Diagnosis not present

## 2022-03-15 DIAGNOSIS — R45851 Suicidal ideations: Secondary | ICD-10-CM | POA: Diagnosis not present

## 2022-03-15 DIAGNOSIS — F1410 Cocaine abuse, uncomplicated: Secondary | ICD-10-CM | POA: Diagnosis not present

## 2022-03-15 DIAGNOSIS — R45850 Homicidal ideations: Secondary | ICD-10-CM | POA: Diagnosis not present

## 2022-03-15 MED ORDER — RISPERIDONE 2 MG PO TABS: 2.0000 mg | ORAL_TABLET | Freq: Every day | ORAL | 0 refills | Status: DC

## 2022-03-15 MED ORDER — BENZTROPINE MESYLATE 0.5 MG PO TABS: 0.5000 mg | ORAL_TABLET | Freq: Two times a day (BID) | ORAL | 0 refills | Status: DC

## 2022-03-15 MED ORDER — RISPERIDONE 2 MG PO TABS
2.0000 mg | ORAL_TABLET | Freq: Every day | ORAL | 0 refills | Status: DC
  Filled 2022-03-15: qty 30, 30d supply, fill #0

## 2022-03-15 MED ORDER — BENZTROPINE MESYLATE 0.5 MG PO TABS
0.5000 mg | ORAL_TABLET | Freq: Two times a day (BID) | ORAL | Status: DC
  Administered 2022-03-15: 0.5 mg via ORAL
  Filled 2022-03-15: qty 1

## 2022-03-15 MED ORDER — BENZTROPINE MESYLATE 0.5 MG PO TABS
0.5000 mg | ORAL_TABLET | Freq: Two times a day (BID) | ORAL | 0 refills | Status: DC
  Filled 2022-03-15: qty 60, 30d supply, fill #0

## 2022-03-15 MED ORDER — TRAZODONE HCL 50 MG PO TABS: 50.0000 mg | ORAL_TABLET | Freq: Every evening | ORAL | 0 refills | Status: DC | PRN

## 2022-03-15 MED ORDER — TRAZODONE HCL 50 MG PO TABS
50.0000 mg | ORAL_TABLET | Freq: Every evening | ORAL | 0 refills | Status: DC | PRN
  Filled 2022-03-15: qty 30, 30d supply, fill #0

## 2022-03-15 NOTE — ED Notes (Signed)
Pt is currently sleeping, no distress noted, environmental check complete, will continue to monitor patient for safety. ? ?

## 2022-03-15 NOTE — ED Notes (Signed)
Pt was given oatmeal and juice for breakfast. ?

## 2022-03-15 NOTE — ED Notes (Signed)
Pt resting quietly.  Denies SI and AVH at this time.  Reports he is having homicidal thoughts but would not elaborate towards who.  When asked what was causing these thoughts pt stated he was angry.  No pain or discomfort reported/ observed.  Breathing is even and unlabored.  Will continue to monitor for safety. ?

## 2022-03-15 NOTE — ED Notes (Signed)
Pt discharged with  AVS.  AVS reviewed prior to discharge.  Pt alert, oriented, and ambulatory.  Safety maintained.  °

## 2022-03-15 NOTE — ED Notes (Signed)
PT has been brought on unit familiarized with unit. PT is eating a sandwich, chips, cheese and drinking cranberry juice ?

## 2022-03-15 NOTE — Discharge Instructions (Signed)

## 2022-03-15 NOTE — ED Provider Notes (Signed)
FBC/OBS ASAP Discharge Summary ? ?Date and Time: 03/15/2022 11:35 AM  ?Name: Tyler White  ?MRN:  [id number]  ? ?Discharge Diagnoses:  ?Final diagnoses:  ?Schizoaffective disorder, bipolar type ([place])  ?Alcohol abuse  ?Marijuana use  ?Cocaine use  ?Methamphetamine use ([place])  ? ? ?Subjective: Patient states "I just needed to get my medicine, my risperidone, I do not want to go inpatient."  Patient verbalizes readiness to discharge home. ?Patient is reassessed, face-to-face, by nurse practitioner.  He is alert and oriented, pleasant and cooperative during assessment. ? ?Vere has been diagnosed with schizoaffective disorder.  He reports he ran out of his risperidone several weeks ago.  He reports this has been the most effective medication to treat his mood.  He is not followed by outpatient psychiatry.  Medication refills provided by primary care provider.  Patient meets with primary care provider at [facility].  Patient reports he is homeless but has been residing in an abandoned home.  He is currently working with [facility] to see for more permanent housing. ?Patient endorses history of multiple inpatient psychiatric hospitalizations. ? ?Patient denies suicidal and homicidal ideations currently.  He contracts verbally for safety with this Probation officer. ?He denies auditory visual hallucinations currently.  There is no evidence of delusional thought content and no indication that patient is responding to internal stimuli. ? ?Patient is not forthcoming surrounding alcohol and substance use.  He states "I do not have a problem."  Patient is not interested in substance use treatment resources at this time. ? ?Patient offered support and encouragement.  He declines any person to contact for collateral information at this time. ? ?Stay Summary: HPI from 03/15/2022 at 2240pm: Tyler White is a 26 y.o. male with a history of schizoaffective disorder-bipolar type, alcohol use, marijuana use, cocaine use, and methamphetamine use who  presented to [facility] due to AVH, cocaine use, and SI. He was recommended for transfer to [facility] for continuous assessment.  ?  ?On evaluation, patient is alert and oriented x4.  He is cooperative.  Eye contact is fair.  Speech is clear and coherent.  His responses are brief.  He reports his mood is depressed.  His affect is blunt.  He appears restless.  His thought processes coherent.  His thought content is logical.  He denies auditory hallucinations.  He reports intermittent visual hallucinations of shadows.  No indication that he is responding to internal stimuli.  He denies paranoia.  No delusions elicited during this assessment.  He denies current suicidal ideations.  States that he has suicidal ideations at times.  He denies homicidal ideations.  He reports daily use of marijuana.  He reports using alcohol every other day.  He does not provide a response when asked about quantity.  He reports cocaine use every other day.  Reports that he recently started using methamphetamines.  UDS in the ED positive for cocaine, amphetamines, and THC.  BAL was not performed. ?  ?Patient was inpatient at [facility] 01/29/2022 to 02/08/2022.  He was discharged on olanzapine 5 mg nightly, olanzapine 2-1/2 mg daily, propanolol 20 mg twice daily, trazodone 50 mg nightly as needed, hydroxyzine 25 mg 3 times daily, benztropine 0.5 mg twice daily.  He was started on paliperidone LAI and was to receive paliperidone 234 mg injection on March 13, 2022.  Patient states that he has not taken any medications since discharge and states that he has not received the second paliperidone injection. ?  ?Patient was scheduled to follow up with [facility] for ACTT intake assessment. Patient reports that he did not follow up.  ?  ?Patient was also inpatient at [facility] 11/2021, 09/2021, 03/2021,10/2020, 04/2018, and 10/2017.  He has several inpatient admissions to other facilities. ? ? ?Total Time spent with patient: 20 minutes ? ?Past  Psychiatric History: Schizoaffective disorder, cannabis abuse, schizophrenia, cocaine abuse, polysubstance abuse, stimulant use disorder ?Past Medical History:  ?Past Medical History:  ?Diagnosis Date  ? Hernia, inguinal, right   ? Inguinal hernia   ? right  ? Psychiatric illness   ? Schizophrenia ([place])   ? History reviewed. No pertinent surgical history. ?Family History:  ?Family History  ?Problem Relation Age of Onset  ? Psychiatric Illness Mother   ? Hypertension Sister   ? ?Family Psychiatric History: None reported ?Social History:  ?Social History  ? ?Substance and Sexual Activity  ?Alcohol Use Not Currently  ?   ?Social History  ? ?Substance and Sexual Activity  ?Drug Use Not Currently  ? Types: Marijuana, Cocaine  ? Comment: denies currently  ?  ?Social History  ? ?Socioeconomic History  ? Marital status: Single  ?  Spouse name: Not on file  ? Number of children: Not on file  ? Years of education: 10  ? Highest education level: Not on file  ?Occupational History  ? Not on file  ?Tobacco Use  ? Smoking status: Every Day  ?  Packs/day: 0.50  ?  Years: 5.00  ?  Pack years: 2.50  ?  Types: Cigarettes  ? Smokeless tobacco: Never  ?Vaping Use  ? Vaping Use: Never used  ?Substance and Sexual Activity  ? Alcohol use: Not Currently  ? Drug use: Not Currently  ?  Types: Marijuana, Cocaine  ?  Comment: denies currently  ? Sexual activity: Yes  ?  Birth control/protection: None  ?Other Topics Concern  ? Not on file  ?Social History Narrative  ? ** Merged History Encounter **  ?    ? ** Merged History Encounter **  ?    ? ?Social Determinants of Health  ? ?Financial Resource Strain: Not on file  ?Food Insecurity: Not on file  ?Transportation Needs: Not on file  ?Physical Activity: Not on file  ?Stress: Not on file  ?Social Connections: Not on file  ? ?SDOH:  ?SDOH Screenings  ? ?Alcohol Screen: Low Risk   ? Last Alcohol Screening Score (AUDIT): 0  ?Depression (PHQ2-9): Not on file  ?Financial Resource Strain: Not on file   ?Food Insecurity: Not on file  ?Housing: Not on file  ?Physical Activity: Not on file  ?Social Connections: Not on file  ?Stress: Not on file  ?Tobacco Use: High Risk  ? Smoking Tobacco Use: Every Day  ? Smokeless Tobacco Use: Never  ? Passive Exposure: Not on file  ?Transportation Needs: Not on file  ? ? ?Tobacco Cessation:  A prescription for an FDA-approved tobacco cessation medication was offered at discharge and the patient refused ? ?Current Medications:  ?Current Facility-Administered Medications  ?Medication Dose Route Frequency Provider Last Rate Last Admin  ? acetaminophen (TYLENOL) tablet 650 mg  650 mg Oral Q6H PRN Rozetta Nunnery, NP      ? alum & mag hydroxide-simeth (MAALOX/MYLANTA) 200-200-20 MG/5ML suspension 30 mL  30 mL Oral Q4H PRN Lindon Romp A, NP      ? benztropine (COGENTIN) tablet 0.5 mg  0.5 mg Oral BID Lindon Romp A, NP   0.5 mg at 03/15/22 0915  ? hydrOXYzine (ATARAX)  tablet 25 mg  25 mg Oral Q6H PRN Lindon Romp A, NP      ? loperamide (IMODIUM) capsule 2-4 mg  2-4 mg Oral PRN Rozetta Nunnery, NP      ? LORazepam (ATIVAN) tablet 1 mg  1 mg Oral Q6H PRN Lindon Romp A, NP      ? magnesium hydroxide (MILK OF MAGNESIA) suspension 30 mL  30 mL Oral Daily PRN Rozetta Nunnery, NP      ? multivitamin with minerals tablet 1 tablet  1 tablet Oral Daily Lindon Romp A, NP   1 tablet at 03/15/22 0915  ? OLANZapine (ZYPREXA) tablet 2.5 mg  2.5 mg Oral Daily Lindon Romp A, NP   2.5 mg at 03/15/22 0914  ? OLANZapine (ZYPREXA) tablet 5 mg  5 mg Oral QHS Lindon Romp A, NP   5 mg at 03/14/22 2333  ? ondansetron (ZOFRAN-ODT) disintegrating tablet 4 mg  4 mg Oral Q6H PRN Lindon Romp A, NP      ? propranolol (INDERAL) tablet 20 mg  20 mg Oral BID Lindon Romp A, NP   20 mg at 03/14/22 2330  ? thiamine tablet 100 mg  100 mg Oral Daily Lindon Romp A, NP   100 mg at 03/15/22 7793  ? traZODone (DESYREL) tablet 50 mg  50 mg Oral QHS PRN Rozetta Nunnery, NP      ? ?Current Outpatient Medications  ?Medication  Sig Dispense Refill  ? benztropine (COGENTIN) 0.5 MG tablet Take 1 tablet (0.5 mg total) by mouth 2 (two) times daily. 60 tablet 0  ? paliperidone (INVEGA SUSTENNA) 234 MG/1.5ML SUSY injection Inject 234 mg

## 2022-04-04 ENCOUNTER — Ambulatory Visit (HOSPITAL_COMMUNITY)
Admission: EM | Admit: 2022-04-04 | Discharge: 2022-04-05 | Disposition: A | Discharge status: Home / Self Care | Payer: Medicaid Other | Attending: Psychiatry | Admitting: Psychiatry

## 2022-04-04 DIAGNOSIS — F250 Schizoaffective disorder, bipolar type: Secondary | ICD-10-CM | POA: Diagnosis present

## 2022-04-04 DIAGNOSIS — Z91148 Patient's other noncompliance with medication regimen for other reason: Secondary | ICD-10-CM

## 2022-04-04 DIAGNOSIS — Z5900 Homelessness unspecified: Secondary | ICD-10-CM

## 2022-04-04 DIAGNOSIS — Z765 Malingerer [conscious simulation]: Secondary | ICD-10-CM

## 2022-04-04 DIAGNOSIS — F1910 Other psychoactive substance abuse, uncomplicated: Secondary | ICD-10-CM | POA: Diagnosis present

## 2022-04-04 DIAGNOSIS — Z20822 Contact with and (suspected) exposure to covid-19: Secondary | ICD-10-CM | POA: Insufficient documentation

## 2022-04-04 DIAGNOSIS — R45851 Suicidal ideations: Secondary | ICD-10-CM

## 2022-04-04 MED ORDER — ALUM & MAG HYDROXIDE-SIMETH 200-200-20 MG/5ML PO SUSP: 30.0000 mL | ORAL | Status: DC | PRN

## 2022-04-04 MED ORDER — ACETAMINOPHEN 325 MG PO TABS: 650.0000 mg | ORAL_TABLET | Freq: Four times a day (QID) | ORAL | Status: DC | PRN

## 2022-04-04 MED ORDER — HYDROXYZINE HCL 25 MG PO TABS: 25.0000 mg | ORAL_TABLET | Freq: Three times a day (TID) | ORAL | Status: DC | PRN

## 2022-04-04 MED ORDER — RISPERIDONE 2 MG PO TABS
2.0000 mg | ORAL_TABLET | Freq: Every day | ORAL | Status: DC
  Administered 2022-04-05: 2 mg via ORAL
  Filled 2022-04-04: qty 1

## 2022-04-04 MED ORDER — TRAZODONE HCL 50 MG PO TABS
50.0000 mg | ORAL_TABLET | Freq: Every evening | ORAL | Status: DC | PRN
Start: 2022-04-04 — End: 2022-04-05

## 2022-04-04 MED ORDER — MAGNESIUM HYDROXIDE 400 MG/5ML PO SUSP: 30.0000 mL | Freq: Every day | ORAL | Status: DC | PRN

## 2022-04-04 MED ORDER — BENZTROPINE MESYLATE 0.5 MG PO TABS
0.5000 mg | ORAL_TABLET | Freq: Two times a day (BID) | ORAL | Status: DC
  Administered 2022-04-05 (×2): 0.5 mg via ORAL
  Filled 2022-04-04 (×2): qty 1

## 2022-04-04 MED ORDER — PROPRANOLOL HCL 20 MG PO TABS
20.0000 mg | ORAL_TABLET | Freq: Two times a day (BID) | ORAL | Status: DC
  Administered 2022-04-05 (×2): 20 mg via ORAL
  Filled 2022-04-04 (×2): qty 1

## 2022-04-04 NOTE — ED Provider Notes (Signed)
Behavioral Health Admission H&P ?(FBC & OBS) ? ?Date: 04/04/22 ?Patient Name: Tyler White ?MRN: [id number] ?Chief Complaint: No chief complaint on file. ?   ? ?Diagnoses:  ?Final diagnoses:  ?Malingerer  ?Suicidal ideation  ?Noncompliance with medications  ?Homelessness  ?Substance abuse ([place])  ? ? ?HPI: Tyler White 26 y.o male present to [facility] c/o suicidal ideation,  per the patient he is homeless,  has not had his medication since January,  he normally get his prescription at [facility].  Pt denies having a psychiatrist or counselor.   ? ? ?Observation of pt, he is alert and oriented x 4,  speech low,  affect flat mininal to no eye contact.  Appearance unkept.  Pt report he try to commit suicidal by cutting is hand,  upon assessment patient has two small scratch to him upper hand.  When asked if this was an attempt to commit suicide or an attempt to get admitted pt recant his story and say he just wanted get admitted,  he has not where to go.  Per the patient is has not taken his medication since January.  He reported he got a prescription but left it on the bus in [place].  Per the patient his last alcohol intake was 5 days ago,  he had 1-2 beers.  ? ?Pt current medication includes; Cogentin 0.5 mg BID,  Invega sustenna '234mg'$ /1.39m susy,  Inderal 20 mg BID, Risperidone 2 mg qhs,  trazodone ? ?See reorder list below ? ?Recommend inpatient observation  ?PHQ 2-9:  ?Flowsheet Row ED from 03/11/2021 in [facility] ?Thoughts that you would be better off dead, or of hurting yourself in some way Several days  ?PHQ-9 Total Score 17  ? ?  ?  [facility]?Most recent reading at 03/15/2022  1:38 AM ED from 03/14/2022 in [facility]?Most recent reading at 03/14/2022 11:09 AM Admission (Discharged) from 01/29/2022 in [id number] ?Most recent reading at  01/29/2022  2:53 PM  ?C-SSRS RISK CATEGORY Moderate Risk Moderate Risk High Risk  ? ?  ?  ? ?Total Time spent with patient: 20 minutes ? ?Musculoskeletal  ?Strength & Muscle Tone: within normal limits ?Gait & Station: normal ?Patient leans: N/A ? ?Psychiatric Specialty Exam  ?Presentation ?General Appearance: Disheveled ? ?Eye Contact:Absent ? ?Speech:Slow ? ?Speech Volume:Decreased ? ?Handedness:Ambidextrous ? ? ?Mood and Affect  ?Mood:Dysphoric ? ?Affect:Constricted; Flat; Restricted ? ? ?Thought Process  ?Thought Processes:Linear ? ?Descriptions of Associations:Tangential ? ?Orientation:Full (Time, Place and Person) ? ?Thought Content:Illogical ? Diagnosis of Schizophrenia or Schizoaffective disorder in past: Yes ? Duration of Psychotic Symptoms: Greater than six months ? ?Hallucinations:Hallucinations: Auditory; Visual ?Description of Auditory Hallucinations: hear voices tell him to take his meds ?Description of Visual Hallucinations: shadow ? ?Ideas of Reference:None ? ?Suicidal Thoughts:Suicidal Thoughts: Yes, Passive ?SI Passive Intent and/or Plan: Without Intent ? ?Homicidal Thoughts:Homicidal Thoughts: No ? ? ?Sensorium  ?Memory:Immediate Good; Recent Fair ? ?Judgment:Poor ? ?Insight:Poor ? ? ?Executive Functions  ?Concentration:Fair ? ?Attention Span:Poor ? ?Recall:Fair ? ?FCicero? ?Language:Fair ? ? ?Psychomotor Activity  ?Psychomotor Activity:No data recorded ? ?Assets  ?Assets:Communication Skills ? ? ?Sleep  ?Sleep:Sleep: Poor ? ? ?Nutritional Assessment (For OBS and FBC admissions only) ?Has the patient had a weight loss or gain of 10 pounds or more in the last 3 months?: No ?Has the patient had a decrease in food  intake/or appetite?: No ?Does the patient have dental problems?: No ?Does the patient have eating habits or behaviors that may be indicators of an eating disorder including binging or inducing vomiting?: No ?Has the patient recently lost weight without trying?:  0 ? ? ? ?Physical Exam ?HENT:  ?   Head: Normocephalic.  ?   Nose: Nose normal.  ?Cardiovascular:  ?   Rate and Rhythm: Normal rate.  ?Pulmonary:  ?   Effort: Pulmonary effort is normal.  ?Musculoskeletal:     ?   General: Normal range of motion.  ?   Cervical back: Normal range of motion.  ?Skin: ?   Findings: Bruising present.  ?Neurological:  ?   Mental Status: He is alert.  ?Psychiatric:     ?   Mood and Affect: Mood normal.     ?   Behavior: Behavior normal.     ?   Thought Content: Thought content normal.     ?   Judgment: Judgment normal.  ? ?Review of Systems  ?Constitutional: Negative.   ?HENT: Negative.    ?Eyes: Negative.   ?Respiratory: Negative.    ?Cardiovascular: Negative.   ?Gastrointestinal: Negative.   ?Genitourinary: Negative.   ?Musculoskeletal: Negative.   ?Skin: Negative.   ?Neurological: Negative.   ?Endo/Heme/Allergies: Negative.   ?Psychiatric/Behavioral:  Positive for depression, hallucinations, substance abuse and suicidal ideas. The patient has insomnia.   ? ?Blood pressure 129/88, pulse 86, temperature 97.7 ?F (36.5 ?C), temperature source Oral, resp. rate 18, SpO2 99 %. There is no height or weight on file to calculate BMI. ? ?Past Psychiatric History: Schizoaffective,  substance abuse,  SI  ? ?Is the patient at risk to self? Yes  ?Has the patient been a risk to self in the past 6 months? Yes .    ?Has the patient been a risk to self within the distant past? Yes   ?Is the patient a risk to others? No   ?Has the patient been a risk to others in the past 6 months? No   ?Has the patient been a risk to others within the distant past? No  ? ?Past Medical History:  ?Past Medical History:  ?Diagnosis Date  ?? Hernia, inguinal, right   ?? Inguinal hernia   ? right  ?? Psychiatric illness   ?? Schizophrenia ([place])   ? No past surgical history on file. ? ?Family History:  ?Family History  ?Problem Relation Age of Onset  ?? Psychiatric Illness Mother   ?? Hypertension Sister   ? ? ?Social History:   ?Social History  ? ?Socioeconomic History  ?? Marital status: Single  ?  Spouse name: Not on file  ?? Number of children: Not on file  ?? Years of education: 10  ?? Highest education level: Not on file  ?Occupational History  ?? Not on file  ?Tobacco Use  ?? Smoking status: Every Day  ?  Packs/day: 0.50  ?  Years: 5.00  ?  Pack years: 2.50  ?  Types: Cigarettes  ?? Smokeless tobacco: Never  ?Vaping Use  ?? Vaping Use: Never used  ?Substance and Sexual Activity  ?? Alcohol use: Not Currently  ?? Drug use: Not Currently  ?  Types: Marijuana, Cocaine  ?  Comment: denies currently  ?? Sexual activity: Yes  ?  Birth control/protection: None  ?Other Topics Concern  ?? Not on file  ?Social History Narrative  ? ** Merged History Encounter **  ?    ? ** Merged  History Encounter **  ?    ? ?Social Determinants of Health  ? ?Financial Resource Strain: Not on file  ?Food Insecurity: Not on file  ?Transportation Needs: Not on file  ?Physical Activity: Not on file  ?Stress: Not on file  ?Social Connections: Not on file  ?Intimate Partner Violence: Not on file  ? ? ?SDOH:  ?SDOH Screenings  ? ?Alcohol Screen: Low Risk   ?? Last Alcohol Screening Score (AUDIT): 0  ?Depression (PHQ2-9): Not on file  ?Financial Resource Strain: Not on file  ?Food Insecurity: Not on file  ?Housing: Not on file  ?Physical Activity: Not on file  ?Social Connections: Not on file  ?Stress: Not on file  ?Tobacco Use: High Risk  ?? Smoking Tobacco Use: Every Day  ?? Smokeless Tobacco Use: Never  ?? Passive Exposure: Not on file  ?Transportation Needs: Not on file  ? ? ?Last Labs:  ?Admission on 03/14/2022, Discharged on 03/14/2022  ?Component Date Value Ref Range Status  ?? Sodium 03/14/2022 139  135 - 145 mmol/L Final  ?? Potassium 03/14/2022 3.6  3.5 - 5.1 mmol/L Final  ?? Chloride 03/14/2022 102  98 - 111 mmol/L Final  ?? CO2 03/14/2022 27  22 - 32 mmol/L Final  ?? Glucose, Bld 03/14/2022 112 (H)  70 - 99 mg/dL Final  ? Glucose reference range applies  only to samples taken after fasting for at least 8 hours.  ?? BUN 03/14/2022 11  6 - 20 mg/dL Final  ?? Creatinine, Ser 03/14/2022 0.86  0.61 - 1.24 mg/dL Final  ?? Calcium 03/14/2022 9.1  8.9 - 10.3 mg/dL Final  ??

## 2022-04-05 ENCOUNTER — Encounter (HOSPITAL_COMMUNITY): Payer: Self-pay | Admitting: Registered Nurse

## 2022-04-05 DIAGNOSIS — Z91148 Patient's other noncompliance with medication regimen for other reason: Secondary | ICD-10-CM

## 2022-04-05 DIAGNOSIS — Z765 Malingerer [conscious simulation]: Secondary | ICD-10-CM

## 2022-04-05 DIAGNOSIS — R45851 Suicidal ideations: Secondary | ICD-10-CM

## 2022-04-05 LAB — CBC WITH DIFFERENTIAL/PLATELET
Abs Immature Granulocytes: 0.01 10*3/uL (ref 0.00–0.07)
Basophils Absolute: 0.1 10*3/uL (ref 0.0–0.1)
Basophils Relative: 1 %
Eosinophils Absolute: 0.2 10*3/uL (ref 0.0–0.5)
Eosinophils Relative: 4 %
HCT: 44.3 % (ref 39.0–52.0)
Hemoglobin: 14.9 g/dL (ref 13.0–17.0)
Immature Granulocytes: 0 %
Lymphocytes Relative: 33 %
Lymphs Abs: 2.2 10*3/uL (ref 0.7–4.0)
MCH: 27.5 pg (ref 26.0–34.0)
MCHC: 33.6 g/dL (ref 30.0–36.0)
MCV: 81.9 fL (ref 80.0–100.0)
Monocytes Absolute: 0.6 10*3/uL (ref 0.1–1.0)
Monocytes Relative: 10 %
Neutro Abs: 3.5 10*3/uL (ref 1.7–7.7)
Neutrophils Relative %: 52 %
Platelets: 328 10*3/uL (ref 150–400)
RBC: 5.41 MIL/uL (ref 4.22–5.81)
RDW: 14.5 % (ref 11.5–15.5)
WBC: 6.6 10*3/uL (ref 4.0–10.5)
nRBC: 0 % (ref 0.0–0.2)

## 2022-04-05 LAB — POCT URINE DRUG SCREEN - MANUAL ENTRY (I-SCREEN)
POC Amphetamine UR: POSITIVE — AB
POC Buprenorphine (BUP): NOT DETECTED
POC Cocaine UR: POSITIVE — AB
POC Marijuana UR: POSITIVE — AB
POC Methadone UR: NOT DETECTED
POC Methamphetamine UR: POSITIVE — AB
POC Morphine: NOT DETECTED
POC Oxazepam (BZO): NOT DETECTED
POC Oxycodone UR: NOT DETECTED
POC Secobarbital (BAR): NOT DETECTED

## 2022-04-05 LAB — COMPREHENSIVE METABOLIC PANEL
ALT: 23 U/L (ref 0–44)
AST: 33 U/L (ref 15–41)
Albumin: 4 g/dL (ref 3.5–5.0)
Alkaline Phosphatase: 49 U/L (ref 38–126)
Anion gap: 6 (ref 5–15)
BUN: 25 mg/dL — ABNORMAL HIGH (ref 6–20)
CO2: 29 mmol/L (ref 22–32)
Calcium: 9.5 mg/dL (ref 8.9–10.3)
Chloride: 105 mmol/L (ref 98–111)
Creatinine, Ser: 1.14 mg/dL (ref 0.61–1.24)
GFR, Estimated: >60 mL/min (ref >60)
Glucose, Bld: 60 mg/dL — ABNORMAL LOW (ref 70–99)
Potassium: 3.5 mmol/L (ref 3.5–5.1)
Sodium: 140 mmol/L (ref 135–145)
Total Bilirubin: 0.9 mg/dL (ref 0.3–1.2)
Total Protein: 7.1 g/dL (ref 6.5–8.1)

## 2022-04-05 LAB — LIPID PANEL
Cholesterol: 202 mg/dL — ABNORMAL HIGH (ref 0–200)
HDL: 78 mg/dL (ref >40)
LDL Cholesterol: 113 mg/dL — ABNORMAL HIGH (ref 0–99)
Total CHOL/HDL Ratio: 2.6 RATIO
Triglycerides: 53 mg/dL (ref <150)
VLDL: 11 mg/dL (ref 0–40)

## 2022-04-05 LAB — HEMOGLOBIN A1C
Hgb A1c MFr Bld: 5 % (ref 4.8–5.6)
Mean Plasma Glucose: 96.8 mg/dL

## 2022-04-05 LAB — RESP PANEL BY RT-PCR (FLU A&B, COVID) ARPGX2
Influenza A by PCR: NEGATIVE
Influenza B by PCR: NEGATIVE
SARS Coronavirus 2 by RT PCR: NEGATIVE

## 2022-04-05 LAB — POC SARS CORONAVIRUS 2 AG -  ED: SARS Coronavirus 2 Ag: NEGATIVE

## 2022-04-05 MED ORDER — TRAZODONE HCL 50 MG PO TABS: 50.0000 mg | ORAL_TABLET | Freq: Every evening | ORAL | 0 refills | Status: DC | PRN

## 2022-04-05 MED ORDER — PALIPERIDONE PALMITATE ER 234 MG/1.5ML IM SUSY
234.0000 mg | PREFILLED_SYRINGE | Freq: Once | INTRAMUSCULAR | Status: AC
  Administered 2022-04-05: 234 mg via INTRAMUSCULAR

## 2022-04-05 MED ORDER — PROPRANOLOL HCL 20 MG PO TABS
20.0000 mg | ORAL_TABLET | Freq: Two times a day (BID) | ORAL | 0 refills | Status: DC
Start: 2022-04-05 — End: 2022-04-16

## 2022-04-05 MED ORDER — RISPERIDONE 2 MG PO TABS: 2.0000 mg | ORAL_TABLET | Freq: Every day | ORAL | 0 refills | Status: DC

## 2022-04-05 MED ORDER — PALIPERIDONE PALMITATE ER 234 MG/1.5ML IM SUSY: 234.0000 mg | PREFILLED_SYRINGE | Freq: Once | INTRAMUSCULAR | 0 refills | Status: DC

## 2022-04-05 MED ORDER — BENZTROPINE MESYLATE 0.5 MG PO TABS: 0.5000 mg | ORAL_TABLET | Freq: Two times a day (BID) | ORAL | 0 refills | Status: DC
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
## 2022-04-05 NOTE — ED Notes (Signed)
Pt given back all his belongings along with bus pass and AVS.  Pt was also given Rx's as written.   Escorted to lobby with no questions or problems noted.    ?

## 2022-04-05 NOTE — Discharge Instructions (Addendum)
? ? ?  Walk in hours for medication management ?Monday, Wednesday, Thursday, and Friday from 8:00 AM to 11:00 AM ?Recommend arriving by by 7:30 AM.  It is first come first serve.   ? ?Walk in hours for therapy intake ?Monday and Wednesday only 8:00 AM to 11:00 AM Encouraged to arrive by 7:30 AM.  It is first come first serve  ? ?[facility]:  Your are scheduled for you next [place] injection 05/02/2022 at 2:30.  You will need to pick up your injection at pharmacy and bring it with you.   ?

## 2022-04-05 NOTE — ED Notes (Signed)
Pt resting quietly. Will continue to monitor.

## 2022-04-05 NOTE — Progress Notes (Signed)
Pt says he has "some" SI.  No HI.  Hearing voices telling him to take his meds.  Pt has been off his meds for 2 months.  He is homeless and wants to be admitted. ?

## 2022-04-05 NOTE — ED Notes (Signed)
Pt resting quietly.  Breathing even and unlabored.  Pt in view of nursing station and no distress noted.  ?

## 2022-04-05 NOTE — ED Notes (Signed)
Patient is calm and cooperative on unit. Patient requested breakfast and able to follow directives from staff. Patient is in assigned space continuing to rest. Patient is safe on unit with continued monitoring. ?

## 2022-04-05 NOTE — ED Notes (Signed)
Pt sleeping  no distress noted positive rise and fall of chest and skin color appropriate for ethnicity. ?

## 2022-04-05 NOTE — ED Notes (Signed)
Pt awake and alert. He has been sitting up in bed at times. Given snack.  Pt was assessed by provider.   ?

## 2022-04-05 NOTE — ED Notes (Signed)
Patient given snack. Patient took shower. Patient responding properly to staff ans milieu. Patient safe on unit with continued monitoring. ?

## 2022-04-05 NOTE — ED Provider Notes (Signed)
FBC/OBS ASAP Discharge Summary ? ?Date and Time: 04/05/2022 10:37 AM  ?Name: Tyler White  ?MRN:  [id number]  ? ?Discharge Diagnoses:  ?Final diagnoses:  ?Malingerer  ?Suicidal ideation  ?Noncompliance with medications  ?Homelessness  ?Substance abuse ([place])  ?Schizoaffective disorder, bipolar type ([place])  ? ? ?Subjective: Tyler White 26 y.o., male patient presented to [facility] as a walk in with complaints of suicidal ideation, auditory hallucinations, voices telling him to take his medications.   ? ?Tyler White, 26 y.o., male patient seen face to face by this provider, consulted with Dr. Ernie Hew; and chart reviewed on 04/05/22.  On evaluation Tyler White reports he is feeling better this morning.  States he hasn't heard any voices since yesterday. Reports he slept well last night and denies suicidal/self-harm/homicidal ideation, psychosis, and paranoia.  Patient reports his last [place] injection was given during his last visit 03/13/2022 at [facility]. States he has not been taking other medications and hasn't set up outpatient psychiatric services.  Discussed given next dose of [place] and setting an appointment for Shot clint at [facility]  Also informed if he planned to stay in [place] and receive services he would need to change his medicaid to [facility] or he would have to receive services in [place].  Understanding voice.  Given Invega Sustenna 234 mg IM and appointment with [place] set for 05/02/2022 at 2:30 PM ?During evaluation [name] is sitting up in bed in no acute distress.  He is alert, oriented x 4, calm, cooperative and attentive.  His mood is depressed with congruent affect.  He has normal speech, and behavior.  Objectively there is no evidence of psychosis/mania or delusional thinking.  Patient is able to converse coherently, goal directed thoughts, no distractibility, or pre-occupation.  He denies suicidal/self-harm/homicidal ideation,  psychosis, and paranoia.  Patient answered question appropriately.    ? ?Stay Summary: Tyler White was admitted for Schizoaffective disorder, bipolar type [facility]), crisis management, safety, and stabilization.  Medical problems were identified and treated as needed.  Home medications were restarted, adjusted, or new medications added as needed or appropriate.  Medications treated with during admission are as follows. ? benztropine  0.5 mg Oral BID  ? paliperidone  234 mg Intramuscular Once  ? propranolol  20 mg Oral BID  ? risperiDONE  2 mg Oral QHS  ? ? ?Labs ordered for review: ?Lab Orders    ?     Resp Panel by RT-PCR (Flu A&B, Covid) Nasopharyngeal Swab    ?     CBC with Differential/Platelet    ?     Comprehensive metabolic panel    ?     Hemoglobin A1c    ?     Lipid panel    ?     POCT Urine Drug Screen - ([facility])    ?     POC SARS Coronavirus 2 Ag-ED - Nasal Swab    ? ?Improvement was monitored by observation and Tyler White 's daily verbal report emotional status and symptom reduction along with clinical staff report. ? ?Tyler White was evaluated by the treatment team for stability and plans for continued recovery upon discharge. Tyler White 's motivation was an integral factor for scheduling further treatment. Employment, transportation, health status, family support, and any pending legal issues, and community services (shelter) were also considered during stay. He was offered further treatment options upon discharge including but not limited to Residential, Intensive Outpatient, and Outpatient treatment.   ?  Tyler White will follow up with ? ? ?Discharge Instructions   ? ?  ? ? ? ?Walk in hours for medication management ?Monday, Wednesday, Thursday, and Friday from 8:00 AM to 11:00 AM ?Recommend arriving by by 7:30 AM.  It is first come first serve.   ? ?Walk in hours for therapy intake ?Monday and Wednesday only 8:00 AM to 11:00 AM Encouraged to arrive by 7:30 AM.  It is first come  first serve  ? ?Shot Clinic:  Your are scheduled for you next [place] injection 05/02/2022 at 2:30.  You will need to pick up your injection at pharmacy and bring it with you.   ? ? ? ? Follow-up Information   ? ? [facility] On 05/02/2022.   ?Specialty: Urgent Care ?Why: At 2:30 PM.  You will need to pick up your Kirt Boys injection from pharmacy and bring it with you.   You will also need to change your Medicaid to [facility] if you contine services at [facility] or you will need to receive injection in [facility]. ?Contact information: ?[address] ?[place] ?[phone] ? ?  ?  ? ? Schedule an appointment as soon as possible for a visit  with [place].   ?Why: Call to set an appointment to establish primary care services ?Contact information: ?[address] ?[place] [phone] ?[phone] ? ?  ?  ? ?  ?  ? ?  ? ? ?Upon completion of this admission Tyler White was both mentally and medically stable for discharge denying suicidal/homicidal ideation, auditory/visual/tactile hallucinations, delusional thoughts, and paranoia.   ? ?Total Time spent with patient: 30 minutes ? ?Past Psychiatric History: See below ?Past Medical History:  ?Past Medical History:  ?Diagnosis Date  ? Hernia, inguinal, right   ? Inguinal hernia   ? right  ? Psychiatric illness   ? Schizophrenia ([place])   ? History reviewed. No pertinent surgical history. ?Family History:  ?Family History  ?Problem Relation Age of Onset  ? Psychiatric Illness Mother   ? Hypertension Sister   ? ?Family Psychiatric History: None reported ?Social History:  ?Social History  ? ?Substance and Sexual Activity  ?Alcohol Use Not Currently  ?   ?Social History  ? ?Substance and Sexual Activity  ?Drug Use Not Currently  ? Types: Marijuana, Cocaine  ? Comment: denies currently  ?  ?Social History  ? ?Socioeconomic History  ? Marital  status: Single  ?  Spouse name: Not on file  ? Number of children: Not on file  ? Years of education: 10  ? Highest education level: Not on file  ?Occupational History  ? Not on file  ?Tobacco Use  ? Smoking status: Every Day  ?  Packs/day: 0.50  ?  Years: 5.00  ?  Pack years: 2.50  ?  Types: Cigarettes  ? Smokeless tobacco: Never  ?Vaping Use  ? Vaping Use: Never used  ?Substance and Sexual Activity  ? Alcohol use: Not Currently  ? Drug use: Not Currently  ?  Types: Marijuana, Cocaine  ?  Comment: denies currently  ? Sexual activity: Yes  ?  Birth control/protection: None  ?Other Topics Concern  ? Not on file  ?Social History Narrative  ? ** Merged History Encounter **  ?    ? ** Merged History Encounter **  ?    ? ?Social Determinants of Health  ? ?Financial  Resource Strain: Not on file  ?Food Insecurity: Not on file  ?Transportation Needs: Not on file  ?Physical Activity: Not on file  ?Stress: Not on file  ?Social Connections: Not on file  ? ?SDOH:  ?SDOH Screenings  ? ?Alcohol Screen: Low Risk   ? Last Alcohol Screening Score (AUDIT): 0  ?Depression (PHQ2-9): Not on file  ?Financial Resource Strain: Not on file  ?Food Insecurity: Not on file  ?Housing: Not on file  ?Physical Activity: Not on file  ?Social Connections: Not on file  ?Stress: Not on file  ?Tobacco Use: High Risk  ? Smoking Tobacco Use: Every Day  ? Smokeless Tobacco Use: Never  ? Passive Exposure: Not on file  ?Transportation Needs: Not on file  ? ? ?Tobacco Cessation:  A prescription for an FDA-approved tobacco cessation medication was offered at discharge and the patient refused ? ?Current Medications:  ?Current Facility-Administered Medications  ?Medication Dose Route Frequency Provider Last Rate Last Admin  ? acetaminophen (TYLENOL) tablet 650 mg  650 mg Oral Q6H PRN Evette Georges, NP      ? alum & mag hydroxide-simeth (MAALOX/MYLANTA) 200-200-20 MG/5ML suspension 30 mL  30 mL Oral Q4H PRN Evette Georges, NP      ? benztropine (COGENTIN) tablet  0.5 mg  0.5 mg Oral BID Evette Georges, NP   0.5 mg at 04/05/22 3007  ? hydrOXYzine (ATARAX) tablet 25 mg  25 mg Oral TID PRN Evette Georges, NP      ? magnesium hydroxide (MILK OF MAGNESIA) suspension 30 mL  30

## 2022-04-06 ENCOUNTER — Emergency Department (HOSPITAL_COMMUNITY)
Admission: EM | Admit: 2022-04-06 | Discharge: 2022-04-06 | Disposition: A | Discharge status: Home / Self Care | Payer: Medicaid Other | Attending: Emergency Medicine | Admitting: Emergency Medicine

## 2022-04-06 ENCOUNTER — Other Ambulatory Visit: Payer: Self-pay

## 2022-04-06 DIAGNOSIS — R0602 Shortness of breath: Secondary | ICD-10-CM | POA: Diagnosis not present

## 2022-04-06 NOTE — Discharge Instructions (Signed)
Follow up with your doctor

## 2022-04-06 NOTE — ED Provider Notes (Signed)
?[place] ?Provider Note ? ? ?CSN: [id number] ?Arrival date & time: 04/06/22  0130 ? ?  ? ?History ? ?Chief Complaint  ?Patient presents with  ? Shortness of Breath  ? ? ?Tyler White is a 26 y.o. male. ? ?The history is provided by the patient and medical records.  ?Shortness of Breath ? ?26 year old male presenting to the ED with reported shortness of breath.  He denies any chest pain, cough, fever, or chills.  He has no history of asthma or seasonal allergies.  He has not used any medications for his symptoms. No sick contacts. He is pacing the halls asking for a sandwich. ? ?Home Medications ?Prior to Admission medications   ?Medication Sig Start Date End Date Taking? Authorizing Provider  ?benztropine (COGENTIN) 0.5 MG tablet Take 1 tablet (0.5 mg total) by mouth 2 (two) times daily. 04/05/22   Rankin, Shuvon B, NP  ?paliperidone (INVEGA SUSTENNA) 234 MG/1.5ML SUSY injection Inject 234 mg into the muscle once for 1 dose. 04/05/22 04/05/22  Rankin, Shuvon B, NP  ?propranolol (INDERAL) 20 MG tablet Take 1 tablet (20 mg total) by mouth 2 (two) times daily. 04/05/22   Rankin, Shuvon B, NP  ?risperiDONE (RISPERDAL) 2 MG tablet Take 1 tablet (2 mg total) by mouth at bedtime. 04/05/22   Rankin, Shuvon B, NP  ?traZODone (DESYREL) 50 MG tablet Take 1 tablet (50 mg total) by mouth at bedtime as needed for sleep. 04/05/22   Rankin, Shuvon B, NP  ?gabapentin (NEURONTIN) 400 MG capsule Take 1 capsule (400 mg total) by mouth 3 (three) times daily. ?Patient not taking: Reported on 06/09/2021 04/30/21 06/10/21  Ethelene Hal, NP  ?   ? ?Allergies    ?Patient has no known allergies.   ? ?Review of Systems   ?Review of Systems  ?Respiratory:  Positive for shortness of breath.   ?All other systems reviewed and are negative. ? ?Physical Exam ?Updated Vital Signs ?BP 127/77   Pulse 67   Temp 97.7 ?F (36.5 ?C) (Oral)   Resp 16   Ht '5\' 8"'$  (1.727 m)   Wt 69.9 kg   SpO2 97%   BMI 23.43 kg/m?   ?Physical Exam ?Vitals and nursing note reviewed.  ?Constitutional:   ?   Appearance: He is well-developed.  ?HENT:  ?   Head: Normocephalic and atraumatic.  ?Eyes:  ?   Conjunctiva/sclera: Conjunctivae normal.  ?   Pupils: Pupils are equal, round, and reactive to light.  ?Cardiovascular:  ?   Rate and Rhythm: Normal rate and regular rhythm.  ?   Heart sounds: Normal heart sounds.  ?Pulmonary:  ?   Effort: Pulmonary effort is normal.  ?   Breath sounds: Normal breath sounds. No wheezing or rhonchi.  ?   Comments: Lungs CTAB ?Abdominal:  ?   General: Bowel sounds are normal.  ?   Palpations: Abdomen is soft.  ?Musculoskeletal:     ?   General: Normal range of motion.  ?   Cervical back: Normal range of motion.  ?Skin: ?   General: Skin is warm and dry.  ?Neurological:  ?   Mental Status: He is alert and oriented to person, place, and time.  ? ? ?ED Results / Procedures / Treatments   ?Labs ?(all labs ordered are listed, but only abnormal results are displayed) ?Labs Reviewed - No data to display ? ?EKG ?None ? ?Radiology ?No results found. ? ?Procedures ?Procedures  ? ? ?Medications Ordered in  ED ?Medications - No data to display ? ?ED Course/ Medical Decision Making/ A&P ?  ?                        ?Medical Decision Making ? ?26 year old male presenting to the ED with reported shortness of breath.  Very vague in his symptoms, denies chest pain, cough, fever, or chills.  Denies any sick contacts.  No history of asthma.  He is afebrile, nontoxic.  Lungs are clear on exam.  He is pacing around in the hallway asking for sandwich.  At this point, does not appear to have any acute emergent condition.  Feel he is stable for discharge.  Can follow-up with PCP.  Return here for new concerns. ? ? ?Final Clinical Impression(s) / ED Diagnoses ?Final diagnoses:  ?None  ? ? ?Rx / DC Orders ?ED Discharge Orders   ? ? None  ? ?  ? ? ?  ?Larene Pickett, PA-C ?04/06/22 0251 ? ?  ?Merrily Pew, MD ?04/06/22 0319 ? ?

## 2022-04-06 NOTE — ED Triage Notes (Signed)
Homeless pt arrive EMS for increase SOB seen yesterday for same. ?

## 2022-04-10 ENCOUNTER — Emergency Department (HOSPITAL_COMMUNITY)
Admission: EM | Admit: 2022-04-10 | Discharge: 2022-04-10 | Disposition: A | Discharge status: Home / Self Care | Payer: Medicaid Other | Attending: Emergency Medicine | Admitting: Emergency Medicine

## 2022-04-10 ENCOUNTER — Encounter (HOSPITAL_COMMUNITY): Payer: Self-pay | Admitting: *Deleted

## 2022-04-10 DIAGNOSIS — M25571 Pain in right ankle and joints of right foot: Secondary | ICD-10-CM | POA: Insufficient documentation

## 2022-04-10 DIAGNOSIS — Z5321 Procedure and treatment not carried out due to patient leaving prior to being seen by health care provider: Secondary | ICD-10-CM | POA: Diagnosis not present

## 2022-04-10 NOTE — ED Triage Notes (Signed)
Pt arrived by ems for right ankle pain and swelling x 3 days, unsure of any injury.  ?

## 2022-04-10 NOTE — ED Notes (Signed)
Xray called pt, no response ?

## 2022-04-10 NOTE — ED Provider Triage Note (Signed)
Emergency Medicine Provider Triage Evaluation Note ? ?Tyler White , a 26 y.o. male  was evaluated in triage.  Pt complains of ankle pain. ? ?Review of Systems  ?Positive: R ankle pain ?Negative: Fever, numbness ? ?Physical Exam  ?BP 124/83 (BP Location: Left Arm)   Pulse 76   Temp 98.3 ?F (36.8 ?C) (Oral)   Resp 16   SpO2 99%  ?Gen:   Awake, no distress   ?Resp:  Normal effort  ?MSK:   Moves extremities without difficulty  ?Other:   ? ?Medical Decision Making  ?Medically screening exam initiated at 1:57 PM.  Appropriate orders placed.  Tyler White was informed that the remainder of the evaluation will be completed by another provider, this initial triage assessment does not replace that evaluation, and the importance of remaining in the ED until their evaluation is complete. ? ?Report pain and swelling to R ankle since this AM, unsure if there was any injury.  No hx of gout.  No calf pain ?  ?Tyler Moras, PA-C ?04/10/22 1357 ? ?

## 2022-04-10 NOTE — ED Notes (Signed)
No response x3 or to last call ?

## 2022-04-16 ENCOUNTER — Ambulatory Visit (HOSPITAL_COMMUNITY)
Admission: EM | Admit: 2022-04-16 | Discharge: 2022-04-16 | Disposition: A | Discharge status: Home / Self Care | Payer: Medicaid Other | Attending: Psychiatry | Admitting: Psychiatry

## 2022-04-16 ENCOUNTER — Other Ambulatory Visit: Payer: Self-pay

## 2022-04-16 DIAGNOSIS — R45851 Suicidal ideations: Secondary | ICD-10-CM | POA: Insufficient documentation

## 2022-04-16 DIAGNOSIS — Z79899 Other long term (current) drug therapy: Secondary | ICD-10-CM | POA: Insufficient documentation

## 2022-04-16 DIAGNOSIS — Z20822 Contact with and (suspected) exposure to covid-19: Secondary | ICD-10-CM | POA: Insufficient documentation

## 2022-04-16 DIAGNOSIS — F32A Depression, unspecified: Secondary | ICD-10-CM | POA: Insufficient documentation

## 2022-04-16 DIAGNOSIS — F1910 Other psychoactive substance abuse, uncomplicated: Secondary | ICD-10-CM | POA: Diagnosis not present

## 2022-04-16 DIAGNOSIS — F250 Schizoaffective disorder, bipolar type: Secondary | ICD-10-CM

## 2022-04-16 DIAGNOSIS — Z5900 Homelessness unspecified: Secondary | ICD-10-CM | POA: Insufficient documentation

## 2022-04-16 DIAGNOSIS — F17210 Nicotine dependence, cigarettes, uncomplicated: Secondary | ICD-10-CM | POA: Insufficient documentation

## 2022-04-16 LAB — HEMOGLOBIN A1C
Hgb A1c MFr Bld: 5.2 % (ref 4.8–5.6)
Mean Plasma Glucose: 102.54 mg/dL

## 2022-04-16 LAB — CBC WITH DIFFERENTIAL/PLATELET
Abs Immature Granulocytes: 0.02 10*3/uL (ref 0.00–0.07)
Basophils Absolute: 0.1 10*3/uL (ref 0.0–0.1)
Basophils Relative: 1 %
Eosinophils Absolute: 0.2 10*3/uL (ref 0.0–0.5)
Eosinophils Relative: 2 %
HCT: 42 % (ref 39.0–52.0)
Hemoglobin: 14.3 g/dL (ref 13.0–17.0)
Immature Granulocytes: 0 %
Lymphocytes Relative: 22 %
Lymphs Abs: 2.2 10*3/uL (ref 0.7–4.0)
MCH: 28.1 pg (ref 26.0–34.0)
MCHC: 34 g/dL (ref 30.0–36.0)
MCV: 82.5 fL (ref 80.0–100.0)
Monocytes Absolute: 0.6 10*3/uL (ref 0.1–1.0)
Monocytes Relative: 6 %
Neutro Abs: 7 10*3/uL (ref 1.7–7.7)
Neutrophils Relative %: 69 %
Platelets: 308 10*3/uL (ref 150–400)
RBC: 5.09 MIL/uL (ref 4.22–5.81)
RDW: 14.1 % (ref 11.5–15.5)
WBC: 10 10*3/uL (ref 4.0–10.5)
nRBC: 0 % (ref 0.0–0.2)

## 2022-04-16 LAB — COMPREHENSIVE METABOLIC PANEL
ALT: 22 U/L (ref 0–44)
AST: 29 U/L (ref 15–41)
Albumin: 4.1 g/dL (ref 3.5–5.0)
Alkaline Phosphatase: 53 U/L (ref 38–126)
Anion gap: 10 (ref 5–15)
BUN: 10 mg/dL (ref 6–20)
CO2: 28 mmol/L (ref 22–32)
Calcium: 9.4 mg/dL (ref 8.9–10.3)
Chloride: 97 mmol/L — ABNORMAL LOW (ref 98–111)
Creatinine, Ser: 0.87 mg/dL (ref 0.61–1.24)
GFR, Estimated: >60 mL/min (ref >60)
Glucose, Bld: 79 mg/dL (ref 70–99)
Potassium: 3.5 mmol/L (ref 3.5–5.1)
Sodium: 135 mmol/L (ref 135–145)
Total Bilirubin: 0.6 mg/dL (ref 0.3–1.2)
Total Protein: 7.2 g/dL (ref 6.5–8.1)

## 2022-04-16 LAB — POCT URINE DRUG SCREEN - MANUAL ENTRY (I-SCREEN)
POC Amphetamine UR: NOT DETECTED
POC Buprenorphine (BUP): NOT DETECTED
POC Cocaine UR: POSITIVE — AB
POC Marijuana UR: NOT DETECTED
POC Methadone UR: NOT DETECTED
POC Methamphetamine UR: NOT DETECTED
POC Morphine: NOT DETECTED
POC Oxazepam (BZO): NOT DETECTED
POC Oxycodone UR: NOT DETECTED
POC Secobarbital (BAR): NOT DETECTED

## 2022-04-16 LAB — TSH: TSH: 1.509 u[IU]/mL (ref 0.350–4.500)

## 2022-04-16 LAB — LIPID PANEL
Cholesterol: 190 mg/dL (ref 0–200)
HDL: 90 mg/dL (ref >40)
LDL Cholesterol: 92 mg/dL (ref 0–99)
Total CHOL/HDL Ratio: 2.1 RATIO
Triglycerides: 40 mg/dL (ref <150)
VLDL: 8 mg/dL (ref 0–40)

## 2022-04-16 LAB — RESP PANEL BY RT-PCR (FLU A&B, COVID) ARPGX2
Influenza A by PCR: NEGATIVE
Influenza B by PCR: NEGATIVE
SARS Coronavirus 2 by RT PCR: NEGATIVE

## 2022-04-16 LAB — POC SARS CORONAVIRUS 2 AG: SARSCOV2ONAVIRUS 2 AG: NEGATIVE

## 2022-04-16 MED ORDER — ALUM & MAG HYDROXIDE-SIMETH 200-200-20 MG/5ML PO SUSP: 30.0000 mL | ORAL | Status: DC | PRN

## 2022-04-16 MED ORDER — MAGNESIUM HYDROXIDE 400 MG/5ML PO SUSP
30.0000 mL | Freq: Every day | ORAL | Status: DC | PRN
Start: 2022-04-16 — End: 2022-04-16

## 2022-04-16 MED ORDER — RISPERIDONE 2 MG PO TABS: 2.0000 mg | ORAL_TABLET | Freq: Every day | ORAL | Status: DC

## 2022-04-16 MED ORDER — PROPRANOLOL HCL 20 MG PO TABS
20.0000 mg | ORAL_TABLET | Freq: Two times a day (BID) | ORAL | 0 refills | Status: DC
Start: 2022-04-16 — End: 2022-05-03

## 2022-04-16 MED ORDER — BENZTROPINE MESYLATE 0.5 MG PO TABS
0.5000 mg | ORAL_TABLET | Freq: Two times a day (BID) | ORAL | Status: DC
  Administered 2022-04-16: 0.5 mg via ORAL
  Filled 2022-04-16: qty 1

## 2022-04-16 MED ORDER — TRAZODONE HCL 50 MG PO TABS: 50.0000 mg | ORAL_TABLET | Freq: Every evening | ORAL | Status: DC | PRN

## 2022-04-16 MED ORDER — RISPERIDONE 2 MG PO TABS: 2.0000 mg | ORAL_TABLET | Freq: Every day | ORAL | 0 refills | Status: DC

## 2022-04-16 MED ORDER — BENZTROPINE MESYLATE 0.5 MG PO TABS: 0.5000 mg | ORAL_TABLET | Freq: Two times a day (BID) | ORAL | 0 refills | Status: DC

## 2022-04-16 MED ORDER — PALIPERIDONE PALMITATE ER 234 MG/1.5ML IM SUSY: 234.0000 mg | PREFILLED_SYRINGE | Freq: Once | INTRAMUSCULAR | 0 refills | Status: AC

## 2022-04-16 MED ORDER — ACETAMINOPHEN 325 MG PO TABS: 650.0000 mg | ORAL_TABLET | Freq: Four times a day (QID) | ORAL | Status: DC | PRN

## 2022-04-16 MED ORDER — TRAZODONE HCL 50 MG PO TABS: 50.0000 mg | ORAL_TABLET | Freq: Every evening | ORAL | 0 refills | Status: DC | PRN

## 2022-04-16 MED ORDER — PROPRANOLOL HCL 20 MG PO TABS
20.0000 mg | ORAL_TABLET | Freq: Two times a day (BID) | ORAL | Status: DC
  Administered 2022-04-16: 20 mg via ORAL
  Filled 2022-04-16: qty 1

## 2022-04-16 NOTE — BH Assessment (Signed)
[place] Assessment Progress Note ?  ?Per Tyler So, NP, this pt does not require psychiatric hospitalization at this time.  Pt is psychiatrically cleared.  Discharge instructions include referral information for [facility], including a previously scheduled appointment for a long acting injectable medication on Friday 05/02/2022 at 14:30, and a recommendation for him to come during walk-in hours to start receiving therapy..  Additionally, information about area supportive services for the homeless has been included in discharge instructions.  Tyler White has been notified. ? ?Tyler Mullet, MA ?Triage Specialist ?[phone] ? ?

## 2022-04-16 NOTE — ED Provider Notes (Signed)
FBC/OBS ASAP Discharge Summary ? ?Date and Time: 04/16/2022 9:10 PM  ?Name: Tyler White  ?MRN:  [id number]  ? ?Discharge Diagnoses:  ?Final diagnoses:  ?Homelessness  ?Substance abuse, daily use ([place])  ?Suicidal ideation  ? ?Subjective:  ? ?Per chart review, pt is a 26 y/o male w/ hx of polysubstance abuse; schizoaffective disorder, bipolar type vs schizophrenia; malingering. Per chart review, in the past 6 months, pt has had 11 ED visits. Pt has had multiple visits to this facility this year, including on 04/04/22, 03/14/22, 01/29/22.  ? ?Pt is assessed face to face by nurse practitioner. Endorses euthymic mood. Denies feeling sad, down, anxious, euphoric, other mood disturbances. Denies current SI/VI/HI. Denies current AVH, paranoia, delusions. States he presented to this facility after experiencing AH telling him to "smoke crack". Denies AH telling him to hurt himself or others. Denies VH. Reports use of crack/cocaine daily, last use yesterday PM. Reports use of alcohol every other day, 1 beer/occasion, last use yesterday. Reports smoking "couple" of cigarettes daily, last use yesterday. Denies use of methamphetamines, amphetamines, heroin, fentanyl, other substances. Reports he is currently homeless. Denies access to a firearm. Discussed w/ pt substance use tx resources. Precontemplation stage of change. Pt states he is not interested in substance use tx at this time. Pt is agreeable to having substance use tx resources provided to him in case he changes his mind. Pt states he does not need rx for medications at this time. States that he has enough medications. Pt counseled on the importance of taking his medications as rx'd and not missing doses. Discussed possible decompensation if not taking medications as prescribed. Pt was reminded of his appointment on 05/02/22 2:30PM at [facility]. Pt verbalized understanding. Safety planning w/ pt completed, pt should call 911/EMS or  go to the nearest emergency room if condition worsens.  ? ?Stay Summary: Pt is a 26 y/o male presenting to [facility] on 04/16/22. Pt reassessed by nurse practitioner today. Pt denies SI/VI/HI, AVH, paranoia, delusions. Pt does not meet inpatient criteria at this time. Pt discharged with outpatient resources.  ? ?Total Time spent with patient: 15 minutes ? ?Past Psychiatric History:  ?Past Medical History:  ?Past Medical History:  ?Diagnosis Date  ? Hernia, inguinal, right   ? Inguinal hernia   ? right  ? Psychiatric illness   ? Schizophrenia ([place])   ? No past surgical history on file. ?Family History:  ?Family History  ?Problem Relation Age of Onset  ? Psychiatric Illness Mother   ? Hypertension Sister   ? ?Family Psychiatric History: Pt denies knowledge of family psychiatric hx ?Social History:  ?Social History  ? ?Substance and Sexual Activity  ?Alcohol Use Not Currently  ?   ?Social History  ? ?Substance and Sexual Activity  ?Drug Use Not Currently  ? Types: Marijuana, Cocaine  ? Comment: denies currently  ?  ?Social History  ? ?Socioeconomic History  ? Marital status: Single  ?  Spouse name: Not on file  ? Number of children: Not on file  ? Years of education: 10  ? Highest education level: Not on file  ?Occupational History  ? Not on file  ?Tobacco Use  ? Smoking status: Every Day  ?  Packs/day: 0.50  ?  Years: 5.00  ?  Pack years: 2.50  ?  Types: Cigarettes  ? Smokeless tobacco: Never  ?Vaping Use  ? Vaping Use: Never used  ?Substance and Sexual Activity  ? Alcohol use: Not  Currently  ? Drug use: Not Currently  ?  Types: Marijuana, Cocaine  ?  Comment: denies currently  ? Sexual activity: Yes  ?  Birth control/protection: None  ?Other Topics Concern  ? Not on file  ?Social History Narrative  ? ** Merged History Encounter **  ?    ? ** Merged History Encounter **  ?    ? ?Social Determinants of Health  ? ?Financial Resource Strain: Not on file  ?Food Insecurity: Not on file  ?Transportation Needs: Not on file   ?Physical Activity: Not on file  ?Stress: Not on file  ?Social Connections: Not on file  ? ?SDOH:  ?SDOH Screenings  ? ?Alcohol Screen: Low Risk   ? Last Alcohol Screening Score (AUDIT): 0  ?Depression (PHQ2-9): Not on file  ?Financial Resource Strain: Not on file  ?Food Insecurity: Not on file  ?Housing: Not on file  ?Physical Activity: Not on file  ?Social Connections: Not on file  ?Stress: Not on file  ?Tobacco Use: High Risk  ? Smoking Tobacco Use: Every Day  ? Smokeless Tobacco Use: Never  ? Passive Exposure: Not on file  ?Transportation Needs: Not on file  ? ? ?Tobacco Cessation:  A prescription for an FDA-approved tobacco cessation medication was offered at discharge and the patient refused ? ?Current Medications:  ?No current facility-administered medications for this encounter.  ? ?Current Outpatient Medications  ?Medication Sig Dispense Refill  ? benztropine (COGENTIN) 0.5 MG tablet Take 1 tablet (0.5 mg total) by mouth 2 (two) times daily. 60 tablet 0  ? paliperidone (INVEGA SUSTENNA) 234 MG/1.5ML SUSY injection Inject 234 mg into the muscle once for 1 dose. 1.5 mL 0  ? propranolol (INDERAL) 20 MG tablet Take 1 tablet (20 mg total) by mouth 2 (two) times daily. 30 tablet 0  ? risperiDONE (RISPERDAL) 2 MG tablet Take 1 tablet (2 mg total) by mouth at bedtime. 15 tablet 0  ? traZODone (DESYREL) 50 MG tablet Take 1 tablet (50 mg total) by mouth at bedtime as needed for sleep. 15 tablet 0  ? ? ?PTA Medications: (Not in a hospital admission) ? ? ?Musculoskeletal  ?Strength & Muscle Tone: within normal limits ?Gait & Station: normal ?Patient leans: N/A ? ?Psychiatric Specialty Exam  ?Presentation  ?General Appearance: Appropriate for Environment; Casual ? ?Eye Contact:Fair ? ?Speech:Clear and Coherent; Normal Rate ? ?Speech Volume:Normal ? ?Handedness: ? ?Mood and Affect  ?Mood:Euthymic ? ?Affect:Constricted ? ?Thought Process  ?Thought Processes:Coherent; Goal Directed ? ?Descriptions of  Associations:Intact ? ?Orientation:Full (Time, Place and Person) ? ?Thought Content:Logical ? Diagnosis of Schizophrenia or Schizoaffective disorder in past: No ? Duration of Psychotic Symptoms: Greater than six months ? ? Hallucinations:Hallucinations: None ? ?Ideas of Reference:None ? ?Suicidal Thoughts:Suicidal Thoughts: No ? ?Homicidal Thoughts:Homicidal Thoughts: No ? ?Sensorium  ?Memory:Immediate Fair; Recent Fair; Remote Fair ? ?Judgment:Intact ? ?Insight:Fair ? ?Executive Functions  ?Concentration:Fair ? ?Attention Span:Fair ? ?Recall:Fair ? ?[place] ? ?Language:Fair ? ?Psychomotor Activity  ?Psychomotor Activity:Psychomotor Activity: Normal ? ?Assets  ?Assets:Communication Skills; Desire for Improvement ? ?Sleep  ?Sleep:Sleep: Fair ? ?Nutritional Assessment (For OBS and FBC admissions only) ?Has the patient had a weight loss or gain of 10 pounds or more in the last 3 months?: No ?Has the patient had a decrease in food intake/or appetite?: No ?Does the patient have dental problems?: No ?Does the patient have eating habits or behaviors that may be indicators of an eating disorder including binging or inducing vomiting?: No ?Has the patient recently  lost weight without trying?: 0 ?Has the patient been eating poorly because of a decreased appetite?: 0 ?Malnutrition Screening Tool Score: 0 ? ? ?Physical Exam  ?Physical Exam ?Constitutional:   ?   Appearance: Normal appearance.  ?HENT:  ?   Head: Normocephalic and atraumatic.  ?Cardiovascular:  ?   Rate and Rhythm: Normal rate.  ?Pulmonary:  ?   Effort: Pulmonary effort is normal.  ?Neurological:  ?   Mental Status: He is alert and oriented to person, place, and time.  ?Psychiatric:     ?   Attention and Perception: Attention and perception normal.     ?   Mood and Affect: Mood normal.     ?   Speech: Speech normal.     ?   Behavior: Behavior normal. Behavior is cooperative.     ?   Thought Content: Thought content normal.     ?   Cognition and  Memory: Cognition and memory normal.     ?   Judgment: Judgment normal.  ? ?Review of Systems  ?Constitutional: Negative.   ?HENT: Negative.    ?Eyes: Negative.   ?Respiratory: Negative.    ?Cardiovascular: Negative.   ?[employer]

## 2022-04-16 NOTE — ED Notes (Signed)
Patient was discharged by the provider to home. Patient was given a bus pass for transportation. Patient was given his AVS with community resources.  ?

## 2022-04-16 NOTE — ED Provider Notes (Signed)
[facility] Continuous Assessment Admission H&P ? ?Date: 04/16/22 ?Patient Name: Tyler White ?MRN: [id number] ?Chief Complaint:  ?Chief Complaint  ?Patient presents with  ? Suicidal  ? Addiction Problem  ?   ? ?Diagnoses:  ?Final diagnoses:  ?Homelessness  ?Substance abuse, daily use ([place])  ?Suicidal ideation  ? ? ?HPI: Tyler White, 26 y.o male present to [facility] c/o of suicidal ideation  and cocaine use.   Pt report he has been using cocaine daily,  last used yesterday.  Pt has a history of Schizoaffective disorder ?Current medication includes; risperdal 2 mg at bedtime,  cogentin 0.5 mg twice daily,  Inderal 20 mg twice daily, Invega sus 234 mg/1.h ml inj ? ?Observation of patient,  he is alert and oriented,  speech clear,  maintain minimal eye contact. Pt report SI with no plans,  per the patient he is homeless,  continue to use Cocaine on a daily basis.  Want to stop using,  pt is requesting an inpatient admission where he can get off using.  Pt report he feel like hurting other but gave vague answers,  has no immediate plans.  Pt was seen [facility] [place] ten day ago,  is frequent visitor to [facility]  When asked about him taking is medication patient stat he took them the last time he was here,  per the patient he has no one to talk to him and encourage him.   ? ?Recommend inpatient observation with possibility getting patient to a inpatient unit ? ?PHQ 2-9:  ?Flowsheet Row ED from 03/11/2021 in [place]  ?Thoughts that you would be better off dead, or of hurting yourself in some way Several days  ?PHQ-9 Total Score 17  ? ?  ?  ?[place] ED from 04/16/2022 in [facility] ED from 04/10/2022 in [place] ED from 04/06/2022 in [place]  ?C-SSRS RISK CATEGORY Low Risk Error: Question 6 not populated Error: Q3, 4, or 5 should not be populated when Q2 is No  ? ?  ?   ? ?Total Time spent with patient: 20 minutes ? ?Musculoskeletal  ?Strength & Muscle Tone: within normal limits ?Gait & Station: normal ?Patient leans: N/A ? ?Psychiatric Specialty Exam  ?Presentation ?General Appearance: Casual ? ?Eye Contact:Fair ? ?Speech:Clear and Coherent ? ?Speech Volume:Normal ? ?Handedness:Ambidextrous ? ? ?Mood and Affect  ?Mood:Depressed; Hopeless ? ?Affect:Constricted; Flat ? ? ?Thought Process  ?Thought Processes:Coherent ? ?Descriptions of Associations:Circumstantial ? ?Orientation:Full (Time, Place and Person) ? ?Thought Content:Rumination ? Diagnosis of Schizophrenia or Schizoaffective disorder in past: Yes ? Duration of Psychotic Symptoms: Greater than six months ? ?Hallucinations:Hallucinations: None ? ?Ideas of Reference:None ? ?Suicidal Thoughts:Suicidal Thoughts: Yes, Passive ?SI Passive Intent and/or Plan: Without Intent ? ?Homicidal Thoughts:Homicidal Thoughts: No ? ? ?Sensorium  ?Memory:Immediate Fair ? ?Judgment:Poor ? ?Insight:Poor ? ? ?Executive Functions  ?Concentration:Poor ? ?Attention Span:Poor ? ?Recall:Poor ? ?Fund of Knowledge:Poor ? ?Language:Poor ? ? ?Psychomotor Activity  ?Psychomotor Activity:Psychomotor Activity: Normal ? ? ?Assets  ?Assets:Communication Skills; Desire for Improvement; Physical Health ? ? ?Sleep  ?Sleep:Sleep: Fair ? ? ?Nutritional Assessment (For OBS and FBC admissions only) ?Has the patient had a weight loss or gain of 10 pounds or more in the last 3 months?: No ?Has the patient had a decrease in food intake/or appetite?: No ?Does the patient have dental problems?: No ?Does the patient have eating habits or behaviors that may be indicators of  an eating disorder including binging or inducing vomiting?: No ?Has the patient recently lost weight without trying?: 0 ?Has the patient been eating poorly because of a decreased appetite?: 0 ?Malnutrition Screening Tool Score: 0 ? ? ? ?Physical Exam ?HENT:  ?   Head: Normocephalic.  ?   Nose: Nose  normal.  ?Cardiovascular:  ?   Rate and Rhythm: Normal rate.  ?Pulmonary:  ?   Effort: Pulmonary effort is normal.  ?Musculoskeletal:     ?   General: Normal range of motion.  ?   Cervical back: Normal range of motion.  ?Skin: ?   General: Skin is warm.  ?Neurological:  ?   General: No focal deficit present.  ?   Mental Status: He is alert.  ?Psychiatric:     ?   Mood and Affect: Mood normal.     ?   Behavior: Behavior normal.     ?   Thought Content: Thought content normal.     ?   Judgment: Judgment normal.  ? ?Review of Systems  ?Constitutional: Negative.   ?HENT: Negative.    ?Eyes: Negative.   ?Respiratory: Negative.    ?Cardiovascular: Negative.   ?Gastrointestinal: Negative.   ?Genitourinary: Negative.   ?Musculoskeletal: Negative.   ?Skin: Negative.   ?Neurological: Negative.   ?Endo/Heme/Allergies: Negative.   ?Psychiatric/Behavioral:  Positive for depression, hallucinations, substance abuse and suicidal ideas. The patient is nervous/anxious.   ? ?Blood pressure (!) 143/90, pulse 65, temperature 98.2 ?F (36.8 ?C), temperature source Oral, resp. rate 18, SpO2 100 %. There is no height or weight on file to calculate BMI. ? ?Past Psychiatric History: Schizoaffective,  SI  ? ?Is the patient at risk to self? Yes  ?Has the patient been a risk to self in the past 6 months? Yes .    ?Has the patient been a risk to self within the distant past? Yes   ?Is the patient a risk to others? Yes   ?Has the patient been a risk to others in the past 6 months? No   ?Has the patient been a risk to others within the distant past? No  ? ?Past Medical History:  ?Past Medical History:  ?Diagnosis Date  ? Hernia, inguinal, right   ? Inguinal hernia   ? right  ? Psychiatric illness   ? Schizophrenia ([place])   ? No past surgical history on file. ? ?Family History:  ?Family History  ?Problem Relation Age of Onset  ? Psychiatric Illness Mother   ? Hypertension Sister   ? ? ?Social History:  ?Social History  ? ?Socioeconomic History  ?  Marital status: Single  ?  Spouse name: Not on file  ? Number of children: Not on file  ? Years of education: 10  ? Highest education level: Not on file  ?Occupational History  ? Not on file  ?Tobacco Use  ? Smoking status: Every Day  ?  Packs/day: 0.50  ?  Years: 5.00  ?  Pack years: 2.50  ?  Types: Cigarettes  ? Smokeless tobacco: Never  ?Vaping Use  ? Vaping Use: Never used  ?Substance and Sexual Activity  ? Alcohol use: Not Currently  ? Drug use: Not Currently  ?  Types: Marijuana, Cocaine  ?  Comment: denies currently  ? Sexual activity: Yes  ?  Birth control/protection: None  ?Other Topics Concern  ? Not on file  ?Social History Narrative  ? ** Merged History Encounter **  ?    ? **  Merged History Encounter **  ?    ? ?Social Determinants of Health  ? ?Financial Resource Strain: Not on file  ?Food Insecurity: Not on file  ?Transportation Needs: Not on file  ?Physical Activity: Not on file  ?Stress: Not on file  ?Social Connections: Not on file  ?Intimate Partner Violence: Not on file  ? ? ?SDOH:  ?SDOH Screenings  ? ?Alcohol Screen: Low Risk   ? Last Alcohol Screening Score (AUDIT): 0  ?Depression (PHQ2-9): Not on file  ?Financial Resource Strain: Not on file  ?Food Insecurity: Not on file  ?Housing: Not on file  ?Physical Activity: Not on file  ?Social Connections: Not on file  ?Stress: Not on file  ?Tobacco Use: High Risk  ? Smoking Tobacco Use: Every Day  ? Smokeless Tobacco Use: Never  ? Passive Exposure: Not on file  ?Transportation Needs: Not on file  ? ? ?Last Labs:  ?Admission on 04/04/2022, Discharged on 04/05/2022  ?Component Date Value Ref Range Status  ? SARS Coronavirus 2 by RT PCR 04/04/2022 NEGATIVE  NEGATIVE Final  ? Comment: (NOTE) ?SARS-CoV-2 target nucleic acids are NOT DETECTED. ? ?The SARS-CoV-2 RNA is generally detectable in upper respiratory ?specimens during the acute phase of infection. The lowest ?concentration of SARS-CoV-2 viral copies this assay can detect is ?138 copies/mL. A  negative result does not preclude SARS-Cov-2 ?infection and should not be used as the sole basis for treatment or ?other patient management decisions. A negative result may occur with  ?improper specimen collection/han

## 2022-04-16 NOTE — Progress Notes (Signed)
?   04/16/22 0437  ?[place] Triage Screening (Walk-ins at [facility] only)  ?How Did You Hear About [place]? Self  ?What Is the Reason for Your Visit/Call Today? Pt has a diagnosis of schizoaffective disorder, bipolar type and a history of using cocaine. He reports his auditory and visual hallucinations have become not intense recently but he cannot describe what he is experiencing. He reports current suicidal ideation with no specific plan. He also reports vague homicidal ideation with no victim, plan or intent. He says he is smoking $20-30 worth of crack daily. He is homeless with no support.  ?How Long Has This Been Causing You Problems? > than 6 months  ?Have You Recently Had Any Thoughts About Hurting Yourself? Yes  ?How long ago did you have thoughts about hurting yourself? Today  ?Are You Planning to Commit Suicide/Harm Yourself At This time? No  ?Have you Recently Had Thoughts About [place]? Yes  ?How long ago did you have thoughts of harming others? Today  ?Are You Planning To Harm Someone At This Time? No  ?Are you currently experiencing any auditory, visual or other hallucinations? Yes  ?Please explain the hallucinations you are currently experiencing: Pt reports hearing voices and seeing things. He cannot provide descriptions.  ?Have You Used Any Alcohol or Drugs in the Past 24 Hours? Yes  ?How long ago did you use Drugs or Alcohol? Today  ?What Did You Use and How Much? $20 worth of crack  ?Do you have any current medical co-morbidities that require immediate attention? No  ?Clinician description of patient physical appearance/behavior: Pt is casually dressed, alert and oriented x4. Pt speaks in a mumbled tone, at low volume and slow pace. Motor behavior appears normal. Eye contact is avoidant and Pt looks down. Pt's mood is depressed and affect is blunted. Thought process is coherent and relevant. He is cooperative.  ?What Do You Feel Would Help You the Most Today? Treatment for Depression or other mood  problem;Medication(s)  ?If access to [facility] was not available, would you have sought care in the Emergency Department? Yes  ?Determination of Need Urgent (48 hours)  ?Options For Referral [facility];Medication Management;Outpatient Therapy  ? ? ?

## 2022-04-16 NOTE — ED Notes (Signed)
Patient is being discharging by the provider. Patient denies SI/HI. Patient will be provided AVS with resources.  ?

## 2022-04-16 NOTE — Discharge Instructions (Addendum)
For your behavioral health needs you are advised to continue treatment with [facility].  You have an appointment for a long acting injectable medication on Friday, May 02, 2022 at 2:30 pm.  You are advised to keep this appointment.  You may also benefit from seeing a therapist, a service that [facility] also offers.  Plan to come during walk-in hours to start seeing a therapist.  Walk-in hours are  Monday and Wednesday from 8:00 am - 11:00 am for therapy.  Walk-in patients are seen on a first come, first served basis, so try to arrive as early as possible for the best chance of being seen the same day: ? ?     [facility] ?     [address]     [place], [place] [address] ?     [phone] ? ?For your shelter needs, contact the following service providers: ? ?     [facility] (operated by [facility]) ?     [address] ?     [place], [place] [address] ?     [phone] ? ?     [facility] ?     [place], [place] [address] ?     [phone] ? ?For day shelter and other supportive services for the homeless, contact the [facility]): ? ?     [facility] ?     [address]     [place], [place] [address] ?     [phone] ? ?For transitional housing, Secondary school teacher.  They provide longer term housing than a shelter, but there is an application process: ? ?     [facility] ?     [facility] [place] ?     [address] ?     [place], [place] [address] ?     [phone] ? ?

## 2022-04-16 NOTE — ED Notes (Signed)
Pt A&O x 4, presents with passive suicidal ideations, no plan noted.  Pt also experiencing auditory and visual hallucinations.  Admits to crack use and drinking beers.  Pt is  homeless.  Pt calm & cooperative, no distress noted.  Monitoring for safety. ?

## 2022-04-16 NOTE — ED Notes (Signed)
Patient denies SI/HI at this time. Patient was given breakfast and his medications. Patient took a shower, street clothes were washed, and linen changed. Patient is resting, he has been calm and cooperative.  ?

## 2022-04-16 NOTE — BH Assessment (Signed)
Comprehensive Clinical Assessment (CCA) Note ? ?04/16/2022 ?Tyler White ?[id number] ? ?DISPOSITION: Gave clinical report to Evette Georges, NP who accepted Pt to [facility] for continuous assessment. ? ?The patient demonstrates the following risk factors for suicide: Chronic risk factors for suicide include: psychiatric disorder of schizoaffective disorder, bipolar type, substance use disorder, and previous self-harm by cutting . Acute risk factors for suicide include: unemployment, social withdrawal/isolation, loss (financial, interpersonal, professional), and recent discharge from inpatient psychiatry. Protective factors for this patient include: hope for the future. Considering these factors, the overall suicide risk at this point appears to be low. Patient is appropriate for outpatient follow up. ? ?[place] ED from 04/16/2022 in [facility] ED from 04/10/2022 in [place] ED from 04/06/2022 in [place]  ?C-SSRS RISK CATEGORY Low Risk Error: Question 6 not populated Error: Q3, 4, or 5 should not be populated when Q2 is No  ? ?  ? ?Pt is a 26 year old single male who presents unaccompanied to [facility] requesting inpatient treatment. He is a poor historian and gives brief responses to questions. Pt has a diagnosis of schizoaffective disorder, bipolar type and a history of cocaine use. He says he has not taken psychiatric medications since he left [facility] 04/05/2022. He says he is experiencing auditory hallucinations of voices and visual hallucinations that he cannot describe. He describes his mood as depressed and reports suicidal ideation with no specific plan. He has a history of cutting himself. He endorses vague thoughts of harming others with bo identified victim, plan, or intent. He reports sleeping 3-4 hours per night. Pt reports smoking $20-30 worth of crack daily and drinking 2-1 beers. He denies other  substance use. ? ?Pt has a history of chronic homelessness and acknowledges he currently has no place to stay. He is unemployed. He cannot identify any family or friends who are supportive. He denies legal problems. He denies access to firearms.  ? ?He was inpatient at [facility] 01/29/2022 to 02/08/2022 and was also discharged from continuous assessment at [facility] 03/31/2022. He has been psychiatrically hospitalized several times at [facility] and other area psychiatric facilities. He was scheduled to follow up with [facility] for ACTT intake assessment. Patient reports that he did not follow up. ? ?Pt is casually dressed, alert and oriented x4. Pt speaks in a mumbled tone, at low volume and slow pace. Motor behavior appears normal. Eye contact is avoidant and Pt looks down. Pt's mood is depressed and affect is blunted. Thought process is coherent and relevant. He is cooperative. ? ?Chief Complaint:  ?Chief Complaint  ?Patient presents with  ? Suicidal  ? Addiction Problem  ? ?Visit Diagnosis:  ?F25.0 Schizoaffective disorder, Bipolar type ?F14.20 Cocaine use disorder, Severe ? ? ?CCA Screening, Triage and Referral (STR) ? ?Patient Reported Information ?How did you hear about [place]? Self ? ?What Is the Reason for Your Visit/Call Today? Pt has a diagnosis of schizoaffective disorder, bipolar type and a history of using cocaine. He reports his auditory and visual hallucinations have become not intense recently but he cannot describe what he is experiencing. He reports current suicidal ideation with no specific plan. He also reports vague homicidal ideation with no victim, plan or intent. He says he is smoking $20-30 worth of crack daily. He is homeless with no support. ? ?How Long Has This Been Causing You Problems? > than 6 months ? ?What Do You Feel Would Help You the  Most Today? Treatment for Depression or other mood problem; Medication(s) ? ? ?Have You Recently Had Any Thoughts About Hurting Yourself?  Yes ? ?Are You Planning to Commit Suicide/Harm Yourself At This time? No ? ? ?Have you Recently Had Thoughts About [place]? Yes ? ?Are You Planning to Harm Someone at This Time? No ? ?Explanation: No data recorded ? ?Have You Used Any Alcohol or Drugs in the Past 24 Hours? Yes ? ?How Long Ago Did You Use Drugs or Alcohol? No data recorded ?What Did You Use and How Much? $20 worth of crack ? ? ?Do You Currently Have a Therapist/Psychiatrist? No ? ?Name of Therapist/Psychiatrist: [facility] ? ? ?Have You Been Recently Discharged From Any Office Practice or Programs? Yes ? ?Explanation of Discharge From Practice/Program: Discharged from [facility] 03/31/2022 ? ? ?  ?CCA Screening Triage Referral Assessment ?Type of Contact: Face-to-Face ? ?Telemedicine Service Delivery:   ?Is this Initial or Reassessment? Initial Assessment ? ?Date Telepsych consult ordered in [facility]:  03/14/22 ? ?Time Telepsych consult ordered in [facility]:  1718 ? ?Location of Assessment: [facility] Assessment Services ? ?Provider Location: [facility] Assessment Services ? ? ?Collateral Involvement: Medical record ? ? ?Does Patient Have a Stage manager Guardian? No data recorded ?Name and Contact of Legal Guardian: No data recorded ?If Minor and Not Living with Parent(s), Who has Custody? NA ? ?Is CPS involved or ever been involved? Never ? ?Is APS involved or ever been involved? Never ? ? ?Patient Determined To Be At Risk for Harm To Self or Others Based on Review of Patient Reported Information or Presenting Complaint? Yes, for Self-Harm ? ?Method: No data recorded ?Availability of Means: No data recorded ?Intent: No data recorded ?Notification Required: No data recorded ?Additional Information for Danger to Others Potential: No data recorded ?Additional Comments for Danger to Others Potential: No data recorded ?Are There Guns or Other Weapons in [place]? No data recorded ?Types of Guns/Weapons: No data recorded ?Are These Weapons Safely Secured?                             No data recorded ?Who Could Verify You Are Able To Have These Secured: No data recorded ?Do You Have any Outstanding Charges, Pending Court Dates, Parole/Probation? No data recorded ?Contacted To Inform of Risk of Harm To Self or Others: Law Enforcement ? ? ? ?Does Patient Present under Involuntary Commitment? No ? ?IVC Papers Initial File Date: No data recorded ? ?[place] of Residence: Kathleen Argue ? ? ?Patient Currently Receiving the Following Services: Not Receiving Services ? ? ?Determination of Need: Urgent (48 hours) ? ? ?Options For Referral: [facility]; Medication Management; Outpatient Therapy ? ? ? ? ?CCA Biopsychosocial ?Patient Reported Schizophrenia/Schizoaffective Diagnosis in Past: Yes ? ? ?Strengths: Pt states he is willing to participate in treatment. ? ? ?Mental Health Symptoms ?Depression:   ?Change in energy/activity; Difficulty Concentrating; Fatigue; Hopelessness; Sleep (too much or little); Irritability ?  ?Duration of Depressive symptoms:  ?Duration of Depressive Symptoms: Greater than two weeks ?  ?Mania:   ?Change in energy/activity; Irritability; Racing thoughts ?  ?Anxiety:    ?Tension; Sleep; Fatigue ?  ?Psychosis:   ?Hallucinations ?  ?Duration of Psychotic symptoms:  ?Duration of Psychotic Symptoms: Greater than six months ?  ?Trauma:   ?None ?  ?Obsessions:   ?None ?  ?Compulsions:   ?None ?  ?Inattention:   ?N/A ?  ?Hyperactivity/Impulsivity:   ?  N/A ?  ?Oppositional/Defiant Behaviors:   ?N/A ?  ?Emotional Irregularity:   ?Recurrent suicidal behaviors/gestures/threats ?  ?Other Mood/Personality Symptoms:   ?None ?  ? ?Mental Status Exam ?Appearance and self-care  ?Stature:   ?Average ?  ?Weight:   ?Average weight ?  ?Clothing:   ?Disheveled ?  ?Grooming:   ?Normal ?  ?Cosmetic use:   ?None ?  ?Posture/gait:   ?Normal ?  ?Motor activity:   ?Not Remarkable ?  ?Sensorium  ?Attention:   ?Distractible ?  ?Concentration:   ?Variable ?  ?Orientation:   ?X5 ?   ?Recall/memory:   ?Normal ?  ?Affect and Mood  ?Affect:   ?Blunted ?  ?Mood:   ?Depressed ?  ?Relating  ?Eye contact:   ?Fleeting; Avoided ?  ?Facial expression:   ?Depressed ?  ?Attitude toward examiner:   ?Cooperative

## 2022-04-17 ENCOUNTER — Emergency Department (HOSPITAL_COMMUNITY): Payer: Medicaid Other

## 2022-04-17 ENCOUNTER — Emergency Department (HOSPITAL_COMMUNITY)
Admission: EM | Admit: 2022-04-17 | Discharge: 2022-04-17 | Disposition: A | Discharge status: Home / Self Care | Payer: Medicaid Other | Attending: Emergency Medicine | Admitting: Emergency Medicine

## 2022-04-17 DIAGNOSIS — R4182 Altered mental status, unspecified: Secondary | ICD-10-CM | POA: Insufficient documentation

## 2022-04-17 DIAGNOSIS — F1910 Other psychoactive substance abuse, uncomplicated: Secondary | ICD-10-CM | POA: Diagnosis not present

## 2022-04-17 LAB — CBC WITH DIFFERENTIAL/PLATELET
Abs Immature Granulocytes: 0.27 10*3/uL — ABNORMAL HIGH (ref 0.00–0.07)
Basophils Absolute: 0.1 10*3/uL (ref 0.0–0.1)
Basophils Relative: 0 %
Eosinophils Absolute: 0.1 10*3/uL (ref 0.0–0.5)
Eosinophils Relative: 0 %
HCT: 49 % (ref 39.0–52.0)
Hemoglobin: 15.7 g/dL (ref 13.0–17.0)
Immature Granulocytes: 1 %
Lymphocytes Relative: 5 %
Lymphs Abs: 1.4 10*3/uL (ref 0.7–4.0)
MCH: 27.6 pg (ref 26.0–34.0)
MCHC: 32 g/dL (ref 30.0–36.0)
MCV: 86.1 fL (ref 80.0–100.0)
Monocytes Absolute: 1.6 10*3/uL — ABNORMAL HIGH (ref 0.1–1.0)
Monocytes Relative: 6 %
Neutro Abs: 23.4 10*3/uL — ABNORMAL HIGH (ref 1.7–7.7)
Neutrophils Relative %: 88 %
Platelets: 143 10*3/uL — ABNORMAL LOW (ref 150–400)
RBC: 5.69 MIL/uL (ref 4.22–5.81)
RDW: 14.4 % (ref 11.5–15.5)
WBC: 26.8 10*3/uL — ABNORMAL HIGH (ref 4.0–10.5)
nRBC: 0 % (ref 0.0–0.2)

## 2022-04-17 LAB — COMPREHENSIVE METABOLIC PANEL
ALT: 27 U/L (ref 0–44)
AST: 40 U/L (ref 15–41)
Albumin: 4.5 g/dL (ref 3.5–5.0)
Alkaline Phosphatase: 63 U/L (ref 38–126)
Anion gap: 17 — ABNORMAL HIGH (ref 5–15)
BUN: 24 mg/dL — ABNORMAL HIGH (ref 6–20)
CO2: 20 mmol/L — ABNORMAL LOW (ref 22–32)
Calcium: 9.5 mg/dL (ref 8.9–10.3)
Chloride: 102 mmol/L (ref 98–111)
Creatinine, Ser: 1.43 mg/dL — ABNORMAL HIGH (ref 0.61–1.24)
GFR, Estimated: >60 mL/min (ref >60)
Glucose, Bld: 198 mg/dL — ABNORMAL HIGH (ref 70–99)
Potassium: 4.8 mmol/L (ref 3.5–5.1)
Sodium: 139 mmol/L (ref 135–145)
Total Bilirubin: 0.6 mg/dL (ref 0.3–1.2)
Total Protein: 7.8 g/dL (ref 6.5–8.1)

## 2022-04-17 LAB — I-STAT VENOUS BLOOD GAS, ED
Acid-base deficit: 2 mmol/L (ref 0.0–2.0)
Bicarbonate: 26.7 mmol/L (ref 20.0–28.0)
Calcium, Ion: 1.05 mmol/L — ABNORMAL LOW (ref 1.15–1.40)
HCT: 50 % (ref 39.0–52.0)
Hemoglobin: 17 g/dL (ref 13.0–17.0)
O2 Saturation: 100 %
Potassium: 5 mmol/L (ref 3.5–5.1)
Sodium: 136 mmol/L (ref 135–145)
TCO2: 28 mmol/L (ref 22–32)
pCO2, Ven: 58.3 mmHg (ref 44–60)
pH, Ven: 7.269 (ref 7.25–7.43)
pO2, Ven: 204 mmHg — ABNORMAL HIGH (ref 32–45)

## 2022-04-17 LAB — I-STAT CHEM 8, ED
BUN: 32 mg/dL — ABNORMAL HIGH (ref 6–20)
Calcium, Ion: 1 mmol/L — ABNORMAL LOW (ref 1.15–1.40)
Chloride: 102 mmol/L (ref 98–111)
Creatinine, Ser: 1.2 mg/dL (ref 0.61–1.24)
Glucose, Bld: 197 mg/dL — ABNORMAL HIGH (ref 70–99)
HCT: 51 % (ref 39.0–52.0)
Hemoglobin: 17.3 g/dL — ABNORMAL HIGH (ref 13.0–17.0)
Potassium: 5 mmol/L (ref 3.5–5.1)
Sodium: 137 mmol/L (ref 135–145)
TCO2: 29 mmol/L (ref 22–32)

## 2022-04-17 LAB — CK: Total CK: 343 U/L (ref 49–397)

## 2022-04-17 LAB — ETHANOL: Alcohol, Ethyl (B): <10 mg/dL (ref <10)

## 2022-04-17 LAB — SALICYLATE LEVEL: Salicylate Lvl: <7 mg/dL — ABNORMAL LOW (ref 7.0–30.0)

## 2022-04-17 LAB — ACETAMINOPHEN LEVEL: Acetaminophen (Tylenol), Serum: <10 ug/mL — ABNORMAL LOW (ref 10–30)

## 2022-04-17 MED ORDER — DIAZEPAM 5 MG/ML IJ SOLN
5.0000 mg | Freq: Once | INTRAMUSCULAR | Status: AC
  Administered 2022-04-17: 5 mg via INTRAVENOUS
  Filled 2022-04-17: qty 2

## 2022-04-17 MED ORDER — LACTATED RINGERS IV SOLN: INTRAVENOUS | Status: DC

## 2022-04-17 MED ORDER — LACTATED RINGERS IV BOLUS
1000.0000 mL | Freq: Once | INTRAVENOUS | Status: AC
Start: 2022-04-17 — End: 2022-04-17
  Administered 2022-04-17: 1000 mL via INTRAVENOUS

## 2022-04-17 MED ORDER — LACTATED RINGERS IV BOLUS
1000.0000 mL | Freq: Once | INTRAVENOUS | Status: AC
  Administered 2022-04-17: 1000 mL via INTRAVENOUS

## 2022-04-17 NOTE — ED Notes (Signed)
Pt pulled out his IV, walked out the room to tell this RN that he needs a sandwich and coke. Pt refused to give urine sample. MD made aware. Pt A&O X4, sitting on the bed, eating food at this moment.  ?

## 2022-04-17 NOTE — ED Provider Triage Note (Signed)
Emergency Medicine Provider Triage Evaluation Note ? ?Tyler White , a 26 y.o. male  was evaluated in triage.  Patient is profusely vomiting on wall and floor.  When asked what he may have taken he will not answer.  Per EMS he was found on the side of the road and told them he took some meth however denies other use.  He was Narcananed and became responsive ? ?Review of Systems  ?Positive: AMS ?Negative: AMS ? ?Physical Exam  ?BP (!) 156/102   Pulse (!) 102   Temp 97.9 ?F (36.6 ?C) (Oral)   Resp 18   SpO2 100%  ?Gen:   Awake, no distress   ?Resp:  Normal effort  ?MSK:   Moves extremities without difficulty  ?Other:  Not responding to questions, profusely vomiting ? ?Medical Decision Making  ?Medically screening exam initiated at 1:59 PM.  Appropriate orders placed.  Tyler White was informed that the remainder of the evaluation will be completed by another provider, this initial triage assessment does not replace that evaluation, and the importance of remaining in the ED until their evaluation is complete. ? ? ?  ?Rhae Hammock, PA-C ?04/17/22 1412 ? ?

## 2022-04-17 NOTE — ED Provider Notes (Signed)
?[facility] ?Provider Note ? ? ?CSN: [id number] ?Arrival date & time: 04/17/22  1354 ? ?  ? ?History ? ?Chief Complaint  ?Patient presents with  ? Drug Overdose  ? ? ?Tyler White is a 26 y.o. male. ? ? ?Drug Overdose ?Patient presents for altered mental status.  He was reportedly found laying on the side of the road.  Bystanders report that he was there since early this morning.  He reportedly told EMS that he used some methamphetamines but denied any other coingestions.  He was given Narcan in the field with improvement in responsiveness.  He arrived in ER triage and was found to have profuse vomiting.  Since his arrival in the ED, he has not been responding to questioning. ? ?  ? ?Home Medications ?Prior to Admission medications   ?Medication Sig Start Date End Date Taking? Authorizing Provider  ?benztropine (COGENTIN) 0.5 MG tablet Take 1 tablet (0.5 mg total) by mouth 2 (two) times daily. 04/16/22   Tharon Aquas, NP  ?paliperidone (INVEGA SUSTENNA) 234 MG/1.5ML SUSY injection Inject 234 mg into the muscle once for 1 dose. 04/16/22 04/16/22  Tharon Aquas, NP  ?propranolol (INDERAL) 20 MG tablet Take 1 tablet (20 mg total) by mouth 2 (two) times daily. 04/16/22   Tharon Aquas, NP  ?risperiDONE (RISPERDAL) 2 MG tablet Take 1 tablet (2 mg total) by mouth at bedtime. 04/16/22   Tharon Aquas, NP  ?traZODone (DESYREL) 50 MG tablet Take 1 tablet (50 mg total) by mouth at bedtime as needed for sleep. 04/16/22   Tharon Aquas, NP  ?gabapentin (NEURONTIN) 400 MG capsule Take 1 capsule (400 mg total) by mouth 3 (three) times daily. ?Patient not taking: Reported on 06/09/2021 04/30/21 06/10/21  Ethelene Hal, NP  ?   ? ?Allergies    ?Patient has no known allergies.   ? ?Review of Systems   ?Review of Systems  ?Unable to perform ROS: Mental status change  ? ?Physical Exam ?Updated Vital Signs ?BP 116/74   Pulse 73   Temp 97.8 ?F (36.6 ?C) (Oral)    Resp (!) 7   SpO2 92%  ?Physical Exam ?Vitals and nursing note reviewed.  ?Constitutional:   ?   General: He is not in acute distress. ?   Appearance: He is well-developed and normal weight. He is not toxic-appearing or diaphoretic.  ?HENT:  ?   Head: Normocephalic and atraumatic.  ?   Right Ear: External ear normal.  ?   Left Ear: External ear normal.  ?   Nose: Nose normal.  ?   Mouth/Throat:  ?   Mouth: Mucous membranes are moist.  ?Eyes:  ?   General: No scleral icterus. ?   Conjunctiva/sclera: Conjunctivae normal.  ?Cardiovascular:  ?   Rate and Rhythm: Normal rate and regular rhythm.  ?   Heart sounds: No murmur heard. ?Pulmonary:  ?   Effort: Pulmonary effort is normal. No respiratory distress.  ?   Breath sounds: Normal breath sounds. No wheezing or rales.  ?Abdominal:  ?   General: Abdomen is flat. There is no distension.  ?   Palpations: Abdomen is soft.  ?   Tenderness: There is no abdominal tenderness. There is no right CVA tenderness or left CVA tenderness.  ?Genitourinary: ?   Comments: Pants are soiled with urine, suggestive of possible seizure ?Musculoskeletal:     ?   General: No swelling, tenderness or deformity. Normal range of motion.  ?  Cervical back: Neck supple. No rigidity.  ?   Right lower leg: No edema.  ?   Left lower leg: No edema.  ?Skin: ?   General: Skin is warm and dry.  ?   Capillary Refill: Capillary refill takes less than 2 seconds.  ?   Coloration: Skin is not jaundiced or pale.  ?Neurological:  ?   General: No focal deficit present.  ?   Mental Status: He is alert and oriented to person, place, and time.  ?   Cranial Nerves: No cranial nerve deficit.  ?   Sensory: No sensory deficit.  ?   Motor: No weakness.  ?   Coordination: Coordination normal.  ?Psychiatric:     ?   Mood and Affect: Mood normal.     ?   Behavior: Behavior normal.     ?   Thought Content: Thought content normal.     ?   Judgment: Judgment normal.  ? ? ?ED Results / Procedures / Treatments   ?Labs ?(all labs  ordered are listed, but only abnormal results are displayed) ?Labs Reviewed  ?COMPREHENSIVE METABOLIC PANEL - Abnormal; Notable for the following components:  ?    Result Value  ? CO2 20 (*)   ? Glucose, Bld 198 (*)   ? BUN 24 (*)   ? Creatinine, Ser 1.43 (*)   ? Anion gap 17 (*)   ? All other components within normal limits  ?CBC WITH DIFFERENTIAL/PLATELET - Abnormal; Notable for the following components:  ? WBC 26.8 (*)   ? Platelets 143 (*)   ? Neutro Abs 23.4 (*)   ? Monocytes Absolute 1.6 (*)   ? Abs Immature Granulocytes 0.27 (*)   ? All other components within normal limits  ?SALICYLATE LEVEL - Abnormal; Notable for the following components:  ? Salicylate Lvl <7.0 (*)   ? All other components within normal limits  ?ACETAMINOPHEN LEVEL - Abnormal; Notable for the following components:  ? Acetaminophen (Tylenol), Serum <10 (*)   ? All other components within normal limits  ?I-STAT CHEM 8, ED - Abnormal; Notable for the following components:  ? BUN 32 (*)   ? Glucose, Bld 197 (*)   ? Calcium, Ion 1.00 (*)   ? Hemoglobin 17.3 (*)   ? All other components within normal limits  ?I-STAT VENOUS BLOOD GAS, ED - Abnormal; Notable for the following components:  ? pO2, Ven 204 (*)   ? Calcium, Ion 1.05 (*)   ? All other components within normal limits  ?ETHANOL  ?CK  ?URINALYSIS, ROUTINE W REFLEX MICROSCOPIC  ?LACTIC ACID, PLASMA  ?RAPID URINE DRUG SCREEN, HOSP PERFORMED  ? ? ?EKG ?EKG Interpretation ? ?Date/Time:  Wednesday April 17 2022 15:46:25 EDT ?Ventricular Rate:  74 ?PR Interval:  142 ?QRS Duration: 84 ?QT Interval:  360 ?QTC Calculation: 399 ?R Axis:   95 ?Text Interpretation: Sinus rhythm with marked sinus arrhythmia Rightward axis Borderline ECG When compared with ECG of 16-Apr-2022 06:23, PREVIOUS ECG IS PRESENT Confirmed by Lennice Sites [phone]) on 04/17/2022 3:52:48 PM ? ?Radiology ?CT HEAD WO CONTRAST ? ?Result Date: 04/17/2022 ?CLINICAL DATA:  Altered mental status. EXAM: CT HEAD WITHOUT CONTRAST TECHNIQUE:  Contiguous axial images were obtained from the base of the skull through the vertex without intravenous contrast. RADIATION DOSE REDUCTION: This exam was performed according to the departmental dose-optimization program which includes automated exposure control, adjustment of the mA and/or kV according to patient size and/or use of iterative reconstruction technique. COMPARISON:  June 10, 2017 FINDINGS: Brain: No evidence of acute infarction, hemorrhage, hydrocephalus, extra-axial collection or mass lesion/mass effect. Vascular: No hyperdense vessel or unexpected calcification. Skull: Normal. Negative for fracture or focal lesion. Sinuses/Orbits: There is marked severity left maxillary sinus mucosal thickening. Other: None. IMPRESSION: 1. No acute intracranial abnormality. 2. Marked severity left maxillary sinus disease. Electronically Signed   By: Virgina Norfolk M.D.   On: 04/17/2022 16:25  ? ?DG Chest Port 1 View ? ?Result Date: 04/17/2022 ?CLINICAL DATA:  Unresponsive altered mental status EXAM: PORTABLE CHEST 1 VIEW COMPARISON:  03/14/2022. FINDINGS: Cardiac and mediastinal contours are within normal limits given AP technique. No focal pulmonary opacity. No pleural effusion or pneumothorax. No acute osseous abnormality. IMPRESSION: No acute cardiopulmonary process. Electronically Signed   By: Merilyn Baba M.D.   On: 04/17/2022 14:41   ? ?Procedures ?Procedures  ? ? ?Medications Ordered in ED ?Medications  ?lactated ringers infusion ( Intravenous New Bag/Given 04/17/22 1705)  ?lactated ringers bolus 1,000 mL (1,000 mLs Intravenous New Bag/Given 04/17/22 1522)  ?diazepam (VALIUM) injection 5 mg (5 mg Intravenous Given 04/17/22 1443)  ?lactated ringers bolus 1,000 mL (1,000 mLs Intravenous New Bag/Given 04/17/22 1545)  ? ? ?ED Course/ Medical Decision Making/ A&P ?  ?                        ?Medical Decision Making ?Amount and/or Complexity of Data Reviewed ?Labs: ordered. ?Radiology: ordered. ? ?Risk ?Prescription  drug management. ? ? ?This patient presents to the ED for concern of altered mental status, this involves an extensive number of treatment options, and is a complaint that carries with it a high risk of c

## 2022-04-17 NOTE — ED Notes (Signed)
Pt ambulated w/ steady gait.

## 2022-04-17 NOTE — ED Notes (Signed)
Pt refused to communicate with staff.  ?

## 2022-04-17 NOTE — ED Notes (Signed)
RN made are of PT's blood pressure and that he's laying on his side. ?

## 2022-04-17 NOTE — ED Triage Notes (Signed)
Pt BIB [facility] from the side of the road. Pt was found unresponsive on the side of the road. Fire arrived and gave '2mg'$  of narcan.  ?

## 2022-04-17 NOTE — ED Provider Notes (Signed)
Patient handed off to me.  Patient was found on the side of the street in the heat throwing up.  Admitted to taking meth.  Per chart review he was seen in urgent care yesterday admitted to cocaine use and meth use.  Patient was given Narcan in the field and became responsive.  Work-up thus far is fairly unremarkable.  He has a white count of 26 but no fever, normal vitals.  Likely stress reaction from vomiting and drug use and being in the heat.  CK however is normal.  Creatinine is 1.4.  No other significant electrolyte abnormalities.  Ethanol level is normal.  Chest x-ray without evidence of infection.  Patient has been given 2 L of IV fluids.  Awaiting head CT, urinalysis/drug screen, metabolization of polysubstances.  Will reevaluate. ? ?Overall work-up is unremarkable.  Patient back to his baseline.  Denies suicidal homicidal ideation.  Discharged in good condition. ? ?This chart was dictated using voice recognition software.  Despite best efforts to proofread,  errors can occur which can change the documentation meaning.  ?  Lennice Sites, DO ?04/17/22 2222 ? ?

## 2022-04-17 NOTE — ED Notes (Signed)
Patient transported to CT 

## 2022-04-28 ENCOUNTER — Emergency Department (HOSPITAL_COMMUNITY)
Admission: EM | Admit: 2022-04-28 | Discharge: 2022-04-28 | Disposition: A | Discharge status: Other Acute Inpatient Hospital | Payer: Medicaid Other | Attending: Emergency Medicine | Admitting: Emergency Medicine

## 2022-04-28 ENCOUNTER — Other Ambulatory Visit: Payer: Self-pay

## 2022-04-28 ENCOUNTER — Encounter (HOSPITAL_COMMUNITY): Payer: Self-pay | Admitting: Emergency Medicine

## 2022-04-28 ENCOUNTER — Ambulatory Visit (HOSPITAL_COMMUNITY)
Admission: EM | Admit: 2022-04-28 | Discharge: 2022-04-29 | Disposition: A | Discharge status: Home / Self Care | Payer: Medicaid Other | Source: Home / Self Care

## 2022-04-28 DIAGNOSIS — Z046 Encounter for general psychiatric examination, requested by authority: Secondary | ICD-10-CM | POA: Diagnosis present

## 2022-04-28 DIAGNOSIS — F1210 Cannabis abuse, uncomplicated: Secondary | ICD-10-CM | POA: Insufficient documentation

## 2022-04-28 DIAGNOSIS — Y903 Blood alcohol level of 60-79 mg/100 ml: Secondary | ICD-10-CM | POA: Insufficient documentation

## 2022-04-28 DIAGNOSIS — F17200 Nicotine dependence, unspecified, uncomplicated: Secondary | ICD-10-CM | POA: Insufficient documentation

## 2022-04-28 DIAGNOSIS — F250 Schizoaffective disorder, bipolar type: Secondary | ICD-10-CM

## 2022-04-28 DIAGNOSIS — R45851 Suicidal ideations: Secondary | ICD-10-CM | POA: Insufficient documentation

## 2022-04-28 DIAGNOSIS — F1410 Cocaine abuse, uncomplicated: Secondary | ICD-10-CM | POA: Insufficient documentation

## 2022-04-28 DIAGNOSIS — F1914 Other psychoactive substance abuse with psychoactive substance-induced mood disorder: Secondary | ICD-10-CM | POA: Diagnosis not present

## 2022-04-28 DIAGNOSIS — Z5900 Homelessness unspecified: Secondary | ICD-10-CM | POA: Insufficient documentation

## 2022-04-28 DIAGNOSIS — Z20822 Contact with and (suspected) exposure to covid-19: Secondary | ICD-10-CM | POA: Diagnosis not present

## 2022-04-28 LAB — CBC
HCT: 37.3 % — ABNORMAL LOW (ref 39.0–52.0)
Hemoglobin: 12.5 g/dL — ABNORMAL LOW (ref 13.0–17.0)
MCH: 28.4 pg (ref 26.0–34.0)
MCHC: 33.5 g/dL (ref 30.0–36.0)
MCV: 84.8 fL (ref 80.0–100.0)
Platelets: 288 10*3/uL (ref 150–400)
RBC: 4.4 MIL/uL (ref 4.22–5.81)
RDW: 14 % (ref 11.5–15.5)
WBC: 10.8 10*3/uL — ABNORMAL HIGH (ref 4.0–10.5)
nRBC: 0 % (ref 0.0–0.2)

## 2022-04-28 LAB — RAPID URINE DRUG SCREEN, HOSP PERFORMED
Amphetamines: NOT DETECTED
Barbiturates: NOT DETECTED
Benzodiazepines: NOT DETECTED
Cocaine: POSITIVE — AB
Opiates: NOT DETECTED
Tetrahydrocannabinol: POSITIVE — AB

## 2022-04-28 LAB — COMPREHENSIVE METABOLIC PANEL
ALT: 17 U/L (ref 0–44)
AST: 27 U/L (ref 15–41)
Albumin: 3.7 g/dL (ref 3.5–5.0)
Alkaline Phosphatase: 50 U/L (ref 38–126)
Anion gap: 7 (ref 5–15)
BUN: 14 mg/dL (ref 6–20)
CO2: 22 mmol/L (ref 22–32)
Calcium: 8.7 mg/dL — ABNORMAL LOW (ref 8.9–10.3)
Chloride: 108 mmol/L (ref 98–111)
Creatinine, Ser: 0.88 mg/dL (ref 0.61–1.24)
GFR, Estimated: >60 mL/min (ref >60)
Glucose, Bld: 93 mg/dL (ref 70–99)
Potassium: 3.5 mmol/L (ref 3.5–5.1)
Sodium: 137 mmol/L (ref 135–145)
Total Bilirubin: 0.4 mg/dL (ref 0.3–1.2)
Total Protein: 6.3 g/dL — ABNORMAL LOW (ref 6.5–8.1)

## 2022-04-28 LAB — RESP PANEL BY RT-PCR (FLU A&B, COVID) ARPGX2
Influenza A by PCR: NEGATIVE
Influenza B by PCR: NEGATIVE
SARS Coronavirus 2 by RT PCR: NEGATIVE

## 2022-04-28 LAB — SALICYLATE LEVEL: Salicylate Lvl: <7 mg/dL — ABNORMAL LOW (ref 7.0–30.0)

## 2022-04-28 LAB — ACETAMINOPHEN LEVEL: Acetaminophen (Tylenol), Serum: <10 ug/mL — ABNORMAL LOW (ref 10–30)

## 2022-04-28 LAB — ETHANOL: Alcohol, Ethyl (B): 71 mg/dL — ABNORMAL HIGH (ref <10)

## 2022-04-28 MED ORDER — ALUM & MAG HYDROXIDE-SIMETH 200-200-20 MG/5ML PO SUSP: 30.0000 mL | ORAL | Status: DC | PRN

## 2022-04-28 MED ORDER — TRAZODONE HCL 50 MG PO TABS: 50.0000 mg | ORAL_TABLET | Freq: Every evening | ORAL | Status: DC | PRN

## 2022-04-28 MED ORDER — HYDROXYZINE HCL 25 MG PO TABS: 25.0000 mg | ORAL_TABLET | Freq: Three times a day (TID) | ORAL | Status: DC | PRN

## 2022-04-28 MED ORDER — ACETAMINOPHEN 325 MG PO TABS: 650.0000 mg | ORAL_TABLET | Freq: Four times a day (QID) | ORAL | Status: DC | PRN

## 2022-04-28 MED ORDER — MAGNESIUM HYDROXIDE 400 MG/5ML PO SUSP: 30.0000 mL | Freq: Every day | ORAL | Status: DC | PRN

## 2022-04-28 NOTE — ED Provider Notes (Signed)
[facility] Continuous Assessment Admission H&P ? ?Date: 04/28/22 ?Patient Name: Tyler White ?MRN: [id number] ?Chief Complaint: No chief complaint on file. ?   ? ?Diagnoses:  ?Final diagnoses:  ?Schizoaffective disorder, bipolar type ([place])  ? ? ?HPI: Dayden Viverette is a 26 years old male with psychiatric history of schizoaffective disorder, bipolar type, polysubstance abuse, and malingering.  Patient was evaluated earlier today at [facility] ED for suicidal thoughts.  He was not recommended for continuous assessment and was therefore transferred to [facility].  ? ?Zachrey Deutscher was evaluated face-to-face upon his arrival to [facility]. His chart was reviewed by this NP and information in TTS assessment was validated with patient.  He is alert and oriented x4.  His speech is clear and coherent.  She maintained good eye contact during interaction.  His mood is euthymic with congruent affect.  Objectively he did not appear to be responding to internal/external stimuli or experiencing any delusional thought content.  At this time he is denying suicidal ideation, homicidal ideation, paranoia, and hallucination. ?He admits to drinking 1 alcoholic beverage (1 can of beer) once per week and smoking $20 worth of crack cocaine once per week.  He says he last used crack cocaine 3 days ago.   ?He denies feeling depressed however he is endorsing restlessness and irritability.  ? ?PHQ 2-9:  ?[place] ED from 03/11/2021 in [place]  ?Thoughts that you would be better off dead, or of hurting yourself in some way Several days  ?PHQ-9 Total Score 17  ? ?  ?  ?[place] ED from 04/28/2022 in [place] ED from 04/17/2022 in [place] ED from 04/16/2022 in [facility]  ?C-SSRS RISK CATEGORY High Risk Error: Q3, 4, or 5 should not be populated when Q2 is No Low Risk  ? ?  ?  ? ?Total Time  spent with patient: 15 minutes ? ?Musculoskeletal  ?Strength & Muscle Tone: within normal limits ?Gait & Station: normal ?Patient leans: Right ? ?Psychiatric Specialty Exam  ?Presentation ?General Appearance: Appropriate for Environment ? ?Eye Contact:Good ? ?Speech:Clear and Coherent ? ?Speech Volume:Normal ? ?Handedness:Right ? ? ?Mood and Affect  ?Mood:Euthymic ? ?Affect:Congruent ? ? ?Thought Process  ?Thought Processes:Coherent ? ?Descriptions of Associations:Intact ? ?Orientation:Full (Time, Place and Person) ? ?Thought Content:WDL ? Diagnosis of Schizophrenia or Schizoaffective disorder in past: Yes ? Duration of Psychotic Symptoms: Greater than six months ? ?Hallucinations:Hallucinations: None ? ?Ideas of Reference:None ? ?Suicidal Thoughts:Suicidal Thoughts: No ? ?Homicidal Thoughts:Homicidal Thoughts: No ? ? ?Sensorium  ?Memory:Immediate Good; Recent Good; Remote Fair ? ?Judgment:Fair ? ?Insight:Good ? ? ?Executive Functions  ?Concentration:Good ? ?Attention Span:Good ? ?Recall:Fair ? ?[employer] of [place] ? ?Language:Good ? ? ?Psychomotor Activity  ?Psychomotor Activity:Psychomotor Activity: Normal ? ? ?Assets  ?Assets:Communication Skills; Desire for Improvement; Physical Health ? ? ?Sleep  ?Sleep:Sleep: Fair ?Number of Hours of Sleep: 6 ? ? ?Nutritional Assessment (For OBS and FBC admissions only) ?Has the patient had a weight loss or gain of 10 pounds or more in the last 3 months?: No ?Has the patient had a decrease in food intake/or appetite?: No ?Does the patient have dental problems?: No ?Does the patient have eating habits or behaviors that may be indicators of an eating disorder including binging or inducing vomiting?: No ?Has the patient recently lost weight without trying?: 0 ?Has the patient been eating poorly because of a decreased appetite?: 0 ?Malnutrition Screening Tool  Score: 0 ? ? ? ?Physical Exam ?Vitals and nursing note reviewed.  ?Constitutional:   ?   General: He is not in acute  distress. ?   Appearance: He is well-developed.  ?HENT:  ?   Head: Normocephalic and atraumatic.  ?Eyes:  ?   Conjunctiva/sclera: Conjunctivae normal.  ?Cardiovascular:  ?   Rate and Rhythm: Normal rate.  ?Pulmonary:  ?   Effort: Pulmonary effort is normal. No respiratory distress.  ?Abdominal:  ?   Palpations: Abdomen is soft.  ?   Tenderness: There is no abdominal tenderness.  ?Musculoskeletal:     ?   General: No swelling.  ?   Cervical back: Normal range of motion.  ?Skin: ?   General: Skin is warm and dry.  ?   Capillary Refill: Capillary refill takes less than 2 seconds.  ?Neurological:  ?   Mental Status: He is alert and oriented to person, place, and time.  ?Psychiatric:     ?   Attention and Perception: Attention and perception normal.     ?   Mood and Affect: Mood and affect normal.     ?   Speech: Speech normal.     ?   Behavior: Behavior normal. Behavior is cooperative.     ?   Thought Content: Thought content normal.     ?   Cognition and Memory: Cognition normal.  ? ?Review of Systems  ?Constitutional: Negative.   ?HENT: Negative.    ?Eyes: Negative.   ?Respiratory: Negative.    ?Cardiovascular: Negative.   ?Gastrointestinal: Negative.   ?Genitourinary: Negative.   ?Musculoskeletal: Negative.   ?Skin: Negative.   ?Neurological: Negative.   ?Endo/Heme/Allergies: Negative.   ?Psychiatric/Behavioral:  Positive for substance abuse.   ? ?Blood pressure 139/65, pulse 91, temperature 98.2 ?F (36.8 ?C), temperature source Oral, resp. rate 18, SpO2 100 %. There is no height or weight on file to calculate BMI. ? ?Past Psychiatric History:   ? ?Is the patient at risk to self? No  ?Has the patient been a risk to self in the past 6 months? Yes .    ?Has the patient been a risk to self within the distant past? Yes   ?Is the patient a risk to others? No   ?Has the patient been a risk to others in the past 6 months? No   ?Has the patient been a risk to others within the distant past? No  ? ?Past Medical History:   ?Past Medical History:  ?Diagnosis Date  ? Hernia, inguinal, right   ? Inguinal hernia   ? right  ? Psychiatric illness   ? Schizophrenia ([place])   ? No past surgical history on file. ? ?Family History:  ?Family History  ?Problem Relation Age of Onset  ? Psychiatric Illness Mother   ? Hypertension Sister   ? ? ?Social History:  ?Social History  ? ?Socioeconomic History  ? Marital status: Single  ?  Spouse name: Not on file  ? Number of children: Not on file  ? Years of education: 10  ? Highest education level: Not on file  ?Occupational History  ? Not on file  ?Tobacco Use  ? Smoking status: Every Day  ?  Packs/day: 0.50  ?  Years: 5.00  ?  Pack years: 2.50  ?  Types: Cigarettes  ? Smokeless tobacco: Never  ?Vaping Use  ? Vaping Use: Never used  ?Substance and Sexual Activity  ? Alcohol use: Not Currently  ? Drug use:  Not Currently  ?  Types: Marijuana, Cocaine  ?  Comment: denies currently  ? Sexual activity: Yes  ?  Birth control/protection: None  ?Other Topics Concern  ? Not on file  ?Social History Narrative  ? ** Merged History Encounter **  ?    ? ** Merged History Encounter **  ?    ? ?Social Determinants of Health  ? ?Financial Resource Strain: Not on file  ?Food Insecurity: Not on file  ?Transportation Needs: Not on file  ?Physical Activity: Not on file  ?Stress: Not on file  ?Social Connections: Not on file  ?Intimate Partner Violence: Not on file  ? ? ?SDOH:  ?SDOH Screenings  ? ?Alcohol Screen: Low Risk   ? Last Alcohol Screening Score (AUDIT): 0  ?Depression (PHQ2-9): Not on file  ?Financial Resource Strain: Not on file  ?Food Insecurity: Not on file  ?Housing: Not on file  ?Physical Activity: Not on file  ?Social Connections: Not on file  ?Stress: Not on file  ?Tobacco Use: High Risk  ? Smoking Tobacco Use: Every Day  ? Smokeless Tobacco Use: Never  ? Passive Exposure: Not on file  ?Transportation Needs: Not on file  ? ? ?Last Labs:  ?Admission on 04/28/2022, Discharged on 04/28/2022  ?Component Date  Value Ref Range Status  ? Sodium 04/28/2022 137  135 - 145 mmol/L Final  ? Potassium 04/28/2022 3.5  3.5 - 5.1 mmol/L Final  ? Chloride 04/28/2022 108  98 - 111 mmol/L Final  ? CO2 04/28/2022 22  22 - 32 mmol/L

## 2022-04-28 NOTE — ED Notes (Signed)
Pt has been brought on the unit, food has been given, pt has eaten a chicken parmesan dinner, a sandwich, and drank juice. Pt is now in the bed lying quietly with closed, no distress noted, will continue to monitor patient for safety. ?

## 2022-04-28 NOTE — BH Assessment (Signed)
@  6, Requested patient's nurse Blima Singer, RN) to place the TTS machine in patient's room for the initial TTS assessment.  ?

## 2022-04-28 NOTE — ED Triage Notes (Addendum)
Pt to triage via [place] from [place].  Auditory hallucinations since last night with suicidal and homicidal ideations.  States he normally gets a shot of [place] once a month and he feels it is time for another one. Denies suicidal plan. ? ?Breakfast ordered. ? ?Pt changed into burgundy scrubs, belongings bagged and labeled. Belongings in [place].  Security called to wand pt. ?

## 2022-04-28 NOTE — ED Notes (Signed)
Patient transported by safe transport to [facility] ? ?

## 2022-04-28 NOTE — ED Notes (Signed)
Patient on TTS 

## 2022-04-28 NOTE — BH Assessment (Addendum)
Comprehensive Clinical Assessment (CCA) Note ? ?04/28/2022 ?Tyler White ?[id number] ? ?Disposition: TTS completed. Discussed clinical information with the [facility] Provider Darrol Angel, RN). Patient is accepted to [facility] for continuous obs. Nursing requested to please provide information for report and a time the patient can arrive. Patient's nurse and EDP provided disposition updates.  ? ?[place] ED from 04/28/2022 in [place] [facility] from 04/17/2022 in [place] [facility] from 04/16/2022 in [facility]  ?C-SSRS RISK CATEGORY High Risk Error: Q3, 4, or 5 should not be populated when Q2 is No Low Risk  ? ?  ? ?Chief Complaint:  ?Chief Complaint  ?Patient presents with  ? Suicidal  ? Psychiatric Evaluation  ? ?Visit Diagnosis: Schizoaffective disorder, bipolar type, Substance Induced Mood Disorder; Substance Use Disorder ? ? ?Tyler White is a 26 year old single male presenting to [facility], voluntarily. Pt has a diagnosis of schizoaffective disorder, bipolar type and a history of cocaine use States that a friend called EMS, after patient reported to that friend that he was experiencing suicidal thoughts.  ?He is a poor historian and gives brief responses to questions ? ?Clinician met with patient via teleassessment. Patient continues to endorse suicidal ideations. States that he has experienced intermittent suicidal thoughts for the past year. Denies current plan/intent. Denies prior hx of suicide. He describes his mood as depressed with the associated symptoms that include loss of interest in usual pleasures, angry/irritable, guilt, and insomnia. Appetite is poor and patient reports weight loss. However, unable to provide the amount of his weight loss.   ? ?He has a hx of self injurious behaviors that include cutting himself. Last occurrence of cutting himself was last night. He cut his right arm with a piece of  glass. Stressors include: ?Restlessness?. Also, Pt has a history of chronic homelessness and acknowledges he currently has no place to stay. He is unemployed. He cannot identify any family or friends who are supportive.  ? ?Patient with current homicidal ideations. States that he has felt homicidal since being homeless.  However, when asked to elaborate he provides no answers. He does not disclose a victim or intent/plan. No further explanation given regarding his homicidal ideations, patient provides vague responses. He acknowledges a hx of assaultive and/or aggressive behaviors. States that he was aggressive ?last night around 4am this morning?. States that he was aggressive toward other people but fails to elaborate or explain any details. He denies legal problems. He denies access to firearms. Denies AVH's. Denies paranoia.  ? ?Patient reports a hx of crack cocaine. He starting using at the age of 26 yrs old. Frequency of use is daily.  Pt reports smoking $20-30 worth of crack daily. Last use was 3 days ago and he used ?1 dime?. Patient also reports alcohol use. Age of first use is 26 yrs old. He drinks alcohol 1x per week, 2-3 beers. Last use was yesterday and he drank 1 can of bud light.  ? ?He says he has not taken psychiatric medications since his injection given to him during a admission to [facility] 03/31/2022. He was inpatient at [place] 01/29/2022 to 02/08/2022 and was also discharged from continuous assessment at [facility] 03/31/2022. He has been psychiatrically hospitalized several times at [facility] and other area psychiatric facilities. He was scheduled to follow up with [facility] for ACTT intake assessment. Patient reports that he did not follow up ?because I forgot?.  ? ?Patient is  single with 6 child (85-year-old daughter). Currently unemployed. Homeless for the past 2 years.  Highest level of education is 11th grade. No hx of abuse-sexual, physical, emotional, and/or verbal abuse.   ? ?Pt is casually dressed, alert and oriented x4. Pt speaks in a mumbled tone, at low volume and slow pace. Motor behavior appears normal. Eye contact is avoidant and Pt looks down. Pt's mood is depressed and affect is blunted. Thought process is coherent and relevant. He is cooperative. ? ?Patient requesting admission to [facility]:  ?I want to get back to [facility], stay for a few days, get my monthly injection?,  ?I want to get my weight back up because they feed you good over at [facility], they have good food?.   ? ? ?CCA Screening, Triage and Referral (STR) ? ?Patient Reported Information ?How did you hear about [place]? Self ? ?What Is the Reason for Your Visit/Call Today?  ?Tyler White is a 26 year old single male presenting to [facility], voluntarily. Pt has a diagnosis of schizoaffective disorder, bipolar type and a history of cocaine use States that a friend called EMS, after patient reported to that friend that he was experiencing suicidal thoughts.   He is a poor historian and gives brief responses to questions    Clinician met with patient via teleassessment. Patient continues to endorse suicidal ideations. States that he has experienced intermittent suicidal thoughts for the past year. Denies current plan/intent. Denies prior hx of suicide. He describes his mood as depressed with the associated symptoms that include loss of interest in usual pleasures, angry/irritable, guilt, and insomnia. Appetite is poor and patient reports weight loss. However, unable to provide the amount of his weight loss. He has a hx of self injurious behaviors that include cutting himself. Last occurrence of cutting himself was last night. He cut his right arm with a piece of glass. Stressors: ?Restlessness?. Also, Pt has a history of chronic homelessness and acknowledges he currently has no place to stay. He is unemployed. He cannot identify any family or friends who are supportive.  Patient with current homicidal ideations. States that he has felt  homicidal since being homeless. However, when asked to elaborate he provides no answers. He does not disclose a victim or intent/plan. No further explanation given regarding his homicidal ideations, patient provides vague responses. He acknowledges a hx of assaultive and/or aggressive behaviors. States that he was aggressive ?last night around 4am?. States that he was aggressive toward other people but fails to elaborate or explain any details. He denies legal problems. He denies access to firearms. Denies AVH?s. Denies paranoia.  Patient reports a hx of crack cocaine. He starting using at the age of 26 yrs old.  Pt reports smoking $20-30 worth of crack daily. Patient reports alcohol use. He says he has not taken psychiatric medications since his injection given to him during a admission to the [facility] 03/31/2022. He was inpatient at [facility] 01/29/2022 to 02/08/2022 and was also discharged from continuous assessment at [facility] 03/31/2022. He has been psychiatrically hospitalized several times at [facility] and other area psychiatric facilities. He was scheduled to follow up with [facility] for ACTT intake assessment. Patient reports that he did not follow up ?because I forgot?. ? ?How Long Has This Been Causing You Problems? > than 6 months ? ?What Do You Feel Would Help You the Most Today? Treatment for Depression or other mood problem; Medication(s); Stress Management ? ? ?Have You Recently Had Any Thoughts About Hurting Yourself?  Yes ? ?Are You Planning to Commit Suicide/Harm Yourself At This time? No ? ? ?Have you Recently Had Thoughts About Tyler White? Yes ? ?Are You Planning to Harm Someone at This Time? No ? ?Explanation: No data recorded ? ?Have You Used Any Alcohol or Drugs in the Past 24 Hours? Yes ? ?How Long Ago Did You Use Drugs or Alcohol? No data recorded ?What Did You Use and How Much? Patient reports a hx of crack cocaine. He starting using at the age of 26 yrs old. Frequency  of use is daily.  Pt reports smoking $20-30 worth of crack daily. Last use was 3 days ago and he used ?1 dime?.    Patient reports alcohol use. Age of first use is 26 yrs old. He drinks alcohol 1x per week,

## 2022-04-28 NOTE — ED Provider Notes (Signed)
?[facility] ?Provider Note ? ? ?CSN: [id number] ?Arrival date & time: 04/28/22  8676 ? ?  ? ?History ? ?Chief Complaint  ?Patient presents with  ? Suicidal  ? ? ?Tyler White is a 26 y.o. male. ? ?26 year old male with history of schizophrenia brought in by EMS with complaints of auditory and visual hallucinations with suicidal ideation without plan, denies homicidal ideation.  Patient states that he is due for his Lorayne Bender shot on May 4.  He denies any complaints otherwise.  Reports drinking a beer last night, denies any drug use. ?Per EMS report, auditory hallucinations since last night with suicidal and homicidal ideation. ? ? ?  ? ?Home Medications ?Prior to Admission medications   ?Medication Sig Start Date End Date Taking? Authorizing Provider  ?benztropine (COGENTIN) 0.5 MG tablet Take 1 tablet (0.5 mg total) by mouth 2 (two) times daily. 04/16/22   Tharon Aquas, NP  ?paliperidone (INVEGA SUSTENNA) 234 MG/1.5ML SUSY injection Inject 234 mg into the muscle once for 1 dose. 04/16/22 04/16/22  Tharon Aquas, NP  ?propranolol (INDERAL) 20 MG tablet Take 1 tablet (20 mg total) by mouth 2 (two) times daily. 04/16/22   Tharon Aquas, NP  ?risperiDONE (RISPERDAL) 2 MG tablet Take 1 tablet (2 mg total) by mouth at bedtime. 04/16/22   Tharon Aquas, NP  ?traZODone (DESYREL) 50 MG tablet Take 1 tablet (50 mg total) by mouth at bedtime as needed for sleep. 04/16/22   Tharon Aquas, NP  ?gabapentin (NEURONTIN) 400 MG capsule Take 1 capsule (400 mg total) by mouth 3 (three) times daily. ?Patient not taking: Reported on 06/09/2021 04/30/21 06/10/21  Ethelene Hal, NP  ?   ? ?Allergies    ?Patient has no known allergies.   ? ?Review of Systems   ?Review of Systems ?Negative except as per HPI ?Physical Exam ?Updated Vital Signs ?BP 130/82 (BP Location: Right Arm)   Pulse 68   Temp 98 ?F (36.7 ?C) (Oral)   Resp 18   SpO2 100%  ?Physical Exam ?Vitals and  nursing note reviewed.  ?Constitutional:   ?   General: He is not in acute distress. ?   Appearance: He is well-developed. He is not diaphoretic.  ?HENT:  ?   Head: Normocephalic and atraumatic.  ?   Nose: Nose normal.  ?   Mouth/Throat:  ?   Mouth: Mucous membranes are moist.  ?Eyes:  ?   Pupils: Pupils are equal, round, and reactive to light.  ?Cardiovascular:  ?   Rate and Rhythm: Normal rate and regular rhythm.  ?   Heart sounds: Normal heart sounds.  ?Pulmonary:  ?   Effort: Pulmonary effort is normal.  ?   Breath sounds: Normal breath sounds.  ?Musculoskeletal:  ?   Cervical back: Neck supple.  ?Skin: ?   General: Skin is warm and dry.  ?   Findings: No erythema or rash.  ?Neurological:  ?   Mental Status: He is alert and oriented to person, place, and time.  ?Psychiatric:     ?   Mood and Affect: Affect is flat.     ?   Behavior: Behavior is slowed and withdrawn. Behavior is cooperative.     ?   Thought Content: Thought content includes suicidal ideation. Thought content does not include homicidal ideation. Thought content does not include suicidal plan.  ? ? ?ED Results / Procedures / Treatments   ?Labs ?(all labs ordered are listed,  but only abnormal results are displayed) ?Labs Reviewed  ?COMPREHENSIVE METABOLIC PANEL - Abnormal; Notable for the following components:  ?    Result Value  ? Calcium 8.7 (*)   ? Total Protein 6.3 (*)   ? All other components within normal limits  ?ETHANOL - Abnormal; Notable for the following components:  ? Alcohol, Ethyl (B) 71 (*)   ? All other components within normal limits  ?SALICYLATE LEVEL - Abnormal; Notable for the following components:  ? Salicylate Lvl <2.6 (*)   ? All other components within normal limits  ?ACETAMINOPHEN LEVEL - Abnormal; Notable for the following components:  ? Acetaminophen (Tylenol), Serum <10 (*)   ? All other components within normal limits  ?CBC - Abnormal; Notable for the following components:  ? WBC 10.8 (*)   ? Hemoglobin 12.5 (*)   ? HCT  37.3 (*)   ? All other components within normal limits  ?RAPID URINE DRUG SCREEN, HOSP PERFORMED - Abnormal; Notable for the following components:  ? Cocaine POSITIVE (*)   ? Tetrahydrocannabinol POSITIVE (*)   ? All other components within normal limits  ?RESP PANEL BY RT-PCR (FLU A&B, COVID) ARPGX2  ? ? ?EKG ?None ? ?Radiology ?No results found. ? ?Procedures ?Procedures  ? ? ?Medications Ordered in ED ?Medications - No data to display ? ?ED Course/ Medical Decision Making/ A&P ?  ?                        ?Medical Decision Making ?Amount and/or Complexity of Data Reviewed ?Labs: ordered. ? ?Medically cleared for behavioral health evaluation and disposition. ?26 year old male presents with complaint of auditory and visual hallucinations with suicidal ideation without plan.  Denies drug use, is positive for cocaine and marijuana.  Does report last had beer this morning with an alcohol level in the 70s.  Labs are otherwise reassuring.  Patient is medically cleared for behavioral health evaluation and disposition. ? ? ? ? ? ? ? ?Final Clinical Impression(s) / ED Diagnoses ?Final diagnoses:  ?None  ? ? ?Rx / DC Orders ?ED Discharge Orders   ? ? None  ? ?  ? ? ?  ?Tacy Learn, PA-C ?04/28/22 1508 ? ?  ?Lucrezia Starch, MD ?04/29/22 2030 ? ?

## 2022-04-29 MED ORDER — PALIPERIDONE PALMITATE ER 234 MG/1.5ML IM SUSY
234.0000 mg | PREFILLED_SYRINGE | Freq: Once | INTRAMUSCULAR | Status: AC
  Administered 2022-04-29: 234 mg via INTRAMUSCULAR
  Filled 2022-04-29: qty 1.5

## 2022-04-29 NOTE — ED Notes (Signed)
Pt is currently sleeping, no distress noted, environmental check complete, will continue to monitor patient for safety. ? ?

## 2022-04-29 NOTE — ED Notes (Signed)
Patient was given breakfast.  

## 2022-04-29 NOTE — ED Notes (Signed)
Pt assessed by provider.  Invega IM shot obtained from Pharmacy and administered.  Pt given belongings back and pt escorted off unit with staff.  ?

## 2022-04-29 NOTE — Discharge Instructions (Signed)
Take all medications as prescribed. Keep all follow-up appointments as scheduled.  Do not consume alcohol or use illegal drugs while on prescription medications. Report any adverse effects from your medications to your primary care provider promptly.  In the event of recurrent symptoms or worsening symptoms, call 911, a crisis hotline, or go to the nearest emergency department for evaluation.   

## 2022-04-29 NOTE — ED Notes (Signed)
Pt awake and alert presents with flat affect and sad mood.  Denies SI, HI or AVH.  Is asking to be discharged.  ?

## 2022-04-29 NOTE — ED Provider Notes (Signed)
FBC/OBS ASAP Discharge Summary ? ?Date and Time: 04/29/2022 10:47 AM  ?Name: Tyler White  ?MRN:  [id number]  ? ?Discharge Diagnoses:  ?Final diagnoses:  ?Schizoaffective disorder, bipolar type ([place])  ? ? ?Subjective:  Tyler White was seen and evaluated face-to-face.  Patient is well-known to this service.  Patient was held for overnight observation monitor for safety.  He is requesting discharge denying suicidal or homicidal ideations.  ? ? Discussed medication management and we will make long-acting injectable available prior to discharge. Chart reviwed  Paliperidon 234 mg is due 5/4. Denies auditory visual hallucinations. ? ?Per admission assessment note:  "Tyler White is a 26 years old male with psychiatric history of schizoaffective disorder, bipolar type, polysubstance abuse, and malingering.  Patient was evaluated earlier today at [facility] ED for suicidal thoughts.  He was not recommended for continuous assessment and was therefore transferred to [facility]." ? ?Stay Summary:  ? ?Total Time spent with patient: 15 minutes ? ?Past Psychiatric History:  ?Past Medical History:  ?Past Medical History:  ?Diagnosis Date  ? Hernia, inguinal, right   ? Inguinal hernia   ? right  ? Psychiatric illness   ? Schizophrenia ([place])   ? No past surgical history on file. ?Family History:  ?Family History  ?Problem Relation Age of Onset  ? Psychiatric Illness Mother   ? Hypertension Sister   ? ?Family Psychiatric History:  ?Social History:  ?Social History  ? ?Substance and Sexual Activity  ?Alcohol Use Not Currently  ?   ?Social History  ? ?Substance and Sexual Activity  ?Drug Use Not Currently  ? Types: Marijuana, Cocaine  ? Comment: denies currently  ?  ?Social History  ? ?Socioeconomic History  ? Marital status: Single  ?  Spouse name: Not on file  ? Number of children: Not on file  ? Years of education: 10  ? Highest education level: Not on file  ?Occupational History  ? Not on file  ?Tobacco Use  ? Smoking status: Every  Day  ?  Packs/day: 0.50  ?  Years: 5.00  ?  Pack years: 2.50  ?  Types: Cigarettes  ? Smokeless tobacco: Never  ?Vaping Use  ? Vaping Use: Never used  ?Substance and Sexual Activity  ? Alcohol use: Not Currently  ? Drug use: Not Currently  ?  Types: Marijuana, Cocaine  ?  Comment: denies currently  ? Sexual activity: Yes  ?  Birth control/protection: None  ?Other Topics Concern  ? Not on file  ?Social History Narrative  ? ** Merged History Encounter **  ?    ? ** Merged History Encounter **  ?    ? ?Social Determinants of Health  ? ?Financial Resource Strain: Not on file  ?Food Insecurity: Not on file  ?Transportation Needs: Not on file  ?Physical Activity: Not on file  ?Stress: Not on file  ?Social Connections: Not on file  ? ?SDOH:  ?SDOH Screenings  ? ?Alcohol Screen: Low Risk   ? Last Alcohol Screening Score (AUDIT): 0  ?Depression (PHQ2-9): Not on file  ?Financial Resource Strain: Not on file  ?Food Insecurity: Not on file  ?Housing: Not on file  ?Physical Activity: Not on file  ?Social Connections: Not on file  ?Stress: Not on file  ?Tobacco Use: High Risk  ? Smoking Tobacco Use: Every Day  ? Smokeless Tobacco Use: Never  ? Passive Exposure: Not on file  ?Transportation Needs: Not on file  ? ? ?Tobacco Cessation:  N/A, patient does not  currently use tobacco products ? ?Current Medications:  ?Current Facility-Administered Medications  ?Medication Dose Route Frequency Provider Last Rate Last Admin  ? acetaminophen (TYLENOL) tablet 650 mg  650 mg Oral Q6H PRN Ajibola, Ene A, NP      ? alum & mag hydroxide-simeth (MAALOX/MYLANTA) 200-200-20 MG/5ML suspension 30 mL  30 mL Oral Q4H PRN Ajibola, Ene A, NP      ? hydrOXYzine (ATARAX) tablet 25 mg  25 mg Oral TID PRN Ajibola, Ene A, NP      ? magnesium hydroxide (MILK OF MAGNESIA) suspension 30 mL  30 mL Oral Daily PRN Ajibola, Ene A, NP      ? paliperidone (INVEGA SUSTENNA) injection 234 mg  234 mg Intramuscular Once D[place]ill [place], NP      ? traZODone (DESYREL)  tablet 50 mg  50 mg Oral QHS PRN Ajibola, Ene A, NP      ? ?Current Outpatient Medications  ?Medication Sig Dispense Refill  ? benztropine (COGENTIN) 0.5 MG tablet Take 1 tablet (0.5 mg total) by mouth 2 (two) times daily. 60 tablet 0  ? paliperidone (INVEGA SUSTENNA) 234 MG/1.5ML SUSY injection Inject 234 mg into the muscle once for 1 dose. 1.5 mL 0  ? propranolol (INDERAL) 20 MG tablet Take 1 tablet (20 mg total) by mouth 2 (two) times daily. 30 tablet 0  ? risperiDONE (RISPERDAL) 2 MG tablet Take 1 tablet (2 mg total) by mouth at bedtime. 15 tablet 0  ? traZODone (DESYREL) 50 MG tablet Take 1 tablet (50 mg total) by mouth at bedtime as needed for sleep. 15 tablet 0  ? ? ?PTA Medications: (Not in a hospital admission) ? ? ?Musculoskeletal  ?Strength & Muscle Tone: within normal limits ?Gait & Station: normal ?Patient leans: N/A ? ?Psychiatric Specialty Exam  ?Presentation  ?General Appearance: Appropriate for Environment ? ?Eye Contact:Good ? ?Speech:Clear and Coherent ? ?Speech Volume:Normal ? ?Handedness:Right ? ? ?Mood and Affect  ?Mood:Euthymic ? ?Affect:Congruent ? ? ?Thought Process  ?Thought Processes:Coherent ? ?Descriptions of Associations:Intact ? ?Orientation:Full (Time, Place and Person) ? ?Thought Content:WDL ? Diagnosis of Schizophrenia or Schizoaffective disorder in past: Yes ? Duration of Psychotic Symptoms: Greater than six months ? ? Hallucinations:Hallucinations: None ? ?Ideas of Reference:None ? ?Suicidal Thoughts:Suicidal Thoughts: No ? ?Homicidal Thoughts:Homicidal Thoughts: No ? ? ?Sensorium  ?Memory:Immediate Good; Recent Good; Remote Fair ? ?Judgment:Fair ? ?Insight:Good ? ? ?Executive Functions  ?Concentration:Good ? ?Attention Span:Good ? ?Recall:Fair ? ?[employer] [place] ? ?Language:Good ? ? ?Psychomotor Activity  ?Psychomotor Activity:Psychomotor Activity: Normal ? ? ?Assets  ?Assets:Communication Skills; Desire for Improvement; Physical Health ? ? ?Sleep  ?Sleep:Sleep:  Fair ?Number of Hours of Sleep: 6 ? ? ?Nutritional Assessment (For OBS and FBC admissions only) ?Has the patient had a weight loss or gain of 10 pounds or more in the last 3 months?: No ?Has the patient had a decrease in food intake/or appetite?: No ?Does the patient have dental problems?: No ?Does the patient have eating habits or behaviors that may be indicators of an eating disorder including binging or inducing vomiting?: No ?Has the patient recently lost weight without trying?: 0 ?Has the patient been eating poorly because of a decreased appetite?: 0 ?Malnutrition Screening Tool Score: 0 ? ? ? ?Physical Exam  ?Physical Exam ?Vitals reviewed.  ?HENT:  ?   Head: Normocephalic.  ?Cardiovascular:  ?   Rate and Rhythm: Normal rate.  ?Neurological:  ?   Mental Status: He is oriented to person, place, and time.  ?  Psychiatric:     ?   Attention and Perception: Attention and perception normal.     ?   Mood and Affect: Mood normal.     ?   Speech: Speech normal.     ?   Behavior: Behavior normal. Behavior is cooperative.     ?   Thought Content: Thought content normal. Thought content does not include suicidal ideation.     ?   Cognition and Memory: Cognition normal.  ? ?Review of Systems  ?Constitutional: Negative.   ?Cardiovascular: Negative.   ?Genitourinary: Negative.   ?Psychiatric/Behavioral:  Positive for depression and substance abuse. Negative for suicidal ideas. The patient is nervous/anxious.   ?Blood pressure 132/78, pulse 71, temperature 98.2 ?F (36.8 ?C), temperature source Oral, resp. rate 18, SpO2 99 %. There is no height or weight on file to calculate BMI. ? ?Demographic Factors:  ?Male and Low socioeconomic status ? ?Loss Factors: ?Financial problems/change in socioeconomic status ? ?Historical Factors: ?Family history of mental illness or substance abuse ? ?Risk Reduction Factors:   ?Positive therapeutic relationship and Positive coping skills or problem solving skills ? ?Continued Clinical Symptoms:   ?Depression:   Comorbid alcohol abuse/dependence ?Impulsivity ?Schizophrenia:   Paranoid or undifferentiated type ? ?Cognitive Features That Contribute To Risk:  ?Closed-mindedness   ? ?Suicide Risk:  ?Minimal: No i

## 2022-04-30 ENCOUNTER — Telehealth (HOSPITAL_COMMUNITY): Payer: Self-pay | Admitting: Pediatrics

## 2022-04-30 NOTE — BH Assessment (Signed)
Care Management - [place] Follow Up Discharges  ? ?Writer attempted to make contact with patient today and was unsuccessful.  Phone just rang, no voicemail  ? ?Per chart review, patient is homeless.  Patient was provided with outpatient substance absue resources and homeless resources.  ?

## 2022-05-01 ENCOUNTER — Encounter (HOSPITAL_COMMUNITY): Payer: Self-pay

## 2022-05-01 ENCOUNTER — Emergency Department (HOSPITAL_COMMUNITY)
Admission: EM | Admit: 2022-05-01 | Discharge: 2022-05-01 | Disposition: A | Discharge status: Home / Self Care | Payer: Medicaid Other | Attending: Emergency Medicine | Admitting: Emergency Medicine

## 2022-05-01 DIAGNOSIS — R45851 Suicidal ideations: Secondary | ICD-10-CM | POA: Insufficient documentation

## 2022-05-01 DIAGNOSIS — F1490 Cocaine use, unspecified, uncomplicated: Secondary | ICD-10-CM | POA: Insufficient documentation

## 2022-05-01 DIAGNOSIS — F1410 Cocaine abuse, uncomplicated: Secondary | ICD-10-CM | POA: Diagnosis not present

## 2022-05-01 DIAGNOSIS — Z20822 Contact with and (suspected) exposure to covid-19: Secondary | ICD-10-CM | POA: Diagnosis not present

## 2022-05-01 DIAGNOSIS — F1090 Alcohol use, unspecified, uncomplicated: Secondary | ICD-10-CM | POA: Insufficient documentation

## 2022-05-01 LAB — CBC
HCT: 39.1 % (ref 39.0–52.0)
Hemoglobin: 13.3 g/dL (ref 13.0–17.0)
MCH: 28.4 pg (ref 26.0–34.0)
MCHC: 34 g/dL (ref 30.0–36.0)
MCV: 83.5 fL (ref 80.0–100.0)
Platelets: 302 10*3/uL (ref 150–400)
RBC: 4.68 MIL/uL (ref 4.22–5.81)
RDW: 14.1 % (ref 11.5–15.5)
WBC: 8.8 10*3/uL (ref 4.0–10.5)
nRBC: 0 % (ref 0.0–0.2)

## 2022-05-01 LAB — BASIC METABOLIC PANEL
Anion gap: 10 (ref 5–15)
BUN: 12 mg/dL (ref 6–20)
CO2: 22 mmol/L (ref 22–32)
Calcium: 8.8 mg/dL — ABNORMAL LOW (ref 8.9–10.3)
Chloride: 107 mmol/L (ref 98–111)
Creatinine, Ser: 0.85 mg/dL (ref 0.61–1.24)
GFR, Estimated: >60 mL/min (ref >60)
Glucose, Bld: 82 mg/dL (ref 70–99)
Potassium: 3.3 mmol/L — ABNORMAL LOW (ref 3.5–5.1)
Sodium: 139 mmol/L (ref 135–145)

## 2022-05-01 LAB — SALICYLATE LEVEL: Salicylate Lvl: <7 mg/dL — ABNORMAL LOW (ref 7.0–30.0)

## 2022-05-01 LAB — ETHANOL: Alcohol, Ethyl (B): 47 mg/dL — ABNORMAL HIGH (ref <10)

## 2022-05-01 LAB — RESP PANEL BY RT-PCR (FLU A&B, COVID) ARPGX2
Influenza A by PCR: NEGATIVE
Influenza B by PCR: NEGATIVE
SARS Coronavirus 2 by RT PCR: NEGATIVE

## 2022-05-01 NOTE — ED Notes (Signed)
Patient woke up and ate breakfast. Now back to lying in bed with covers over his head. ?

## 2022-05-01 NOTE — ED Notes (Signed)
Pt placed in triage area to wait until room for tx. Pt changed into burgundy scrubs and all items placed in a pt belongings bag.  ?

## 2022-05-01 NOTE — BH Assessment (Signed)
[facility] Assessment Progress Note ?  ?Per Shuvon Rankin, NP, this pt does not require psychiatric hospitalization at this time.  Pt is psychiatrically cleared.  Pt is a current client of [facility].  Discharge instructions include previously scheduled appointments at the injectable clinic on Thursday, 05/30/2022 at 13:30 and with Alvera Singh, LCSW on Wednesay, 07/03/2022 at 13:00.  Rehabilitation resources for substance use disorders have also been included in AVS.  EDP Blanchie Dessert, MD and pt's nurse, Blima Singer, have been notified. ? ?Jalene Mullet, MA ?Triage Specialist ?[phone] ? ?

## 2022-05-01 NOTE — ED Provider Triage Note (Signed)
Emergency Medicine Provider Triage Evaluation Note ? ?Tyler White , a 26 y.o. male  was evaluated in triage.  Patient with history of schizoaffective disorder presents today for evaluation of suicidal ideation.  Patient states he received his Invega shot yesterday.  Denies HI, or AVH.  Patient is in agreement to wait psychiatric evaluation. ? ?Review of Systems  ?Positive: As above ?Negative: As above ? ?Physical Exam  ?BP 125/86 (BP Location: Right Arm)   Pulse 72   Temp 98 ?F (36.7 ?C) (Oral)   Resp 16   Ht '5\' 8"'$  (1.727 m)   Wt 70.3 kg   SpO2 100%   BMI 23.57 kg/m?  ?Gen:   Awake, no distress   ?Resp:  Normal effort  ?MSK:   Moves extremities without difficulty  ?Other:  Patient has a flat affect, and is slow to respond. ? ?Medical Decision Making  ?Medically screening exam initiated at 6:00 AM.  Appropriate orders placed.  Tyler White was informed that the remainder of the evaluation will be completed by another provider, this initial triage assessment does not replace that evaluation, and the importance of remaining in the ED until their evaluation is complete. ? ? ?  ?Evlyn Courier, PA-C ?05/01/22 1194 ? ?

## 2022-05-01 NOTE — Discharge Instructions (Signed)
For your mental health needs you are advised to continue treatment with [facility].  You are scheduled for appointments at [facility] on Thursday, May 30, 2022 at 1:30 pm and with Alvera Singh, LCSW for therapy on Wednesday, July 03, 2022 at 1:00 pm: ?     [facility] ?     [address]     [place], [place] [address] ?     [phone] ? ?To help you maintain a sober lifestyle, a substance use disorder treatment program may be beneficial to you.  Contact one of the following providers at your earliest opportunity to ask about enrolling their program: ? ?RESIDENTIAL PROGRAMS: ? ?     [facility] ?     [address] ?     [place], [place] [address] ?     [phone] ? ?     [facility] ?     [address]     [place], [place] [address] ?     [phone] ? ?

## 2022-05-01 NOTE — ED Notes (Signed)
Patient to hall 68, MD at bedside ?

## 2022-05-01 NOTE — Consult Note (Addendum)
Telepsych Consultation  ? ?Reason for Consult: Suicidal ideation ?Referring Physician:  Tonye Pearson, PA-C ?Location of Patient: [facility] ED ?Location of Provider: Other: [facility] ? ?Patient Identification: Tyler White ?MRN:  [id number] ?Principal Diagnosis: <principal problem not specified> ?Diagnosis:  Active Problems: ?  * No active hospital problems. * ? ? ?Total Time spent with patient: 30 minutes ? ?Subjective:   ?Tyler White is a 26 y.o. male patient admitted to [facility] ED after presenting via EMS with complaints of not feeling well and suicidal ideation. ? ?HPI:  Tyler White, 26 y.o., male patient seen via tele health by this provider, consulted with Dr. Hampton Abbot; and chart reviewed on 05/01/22.  On evaluation Tyler White reports he came back to the hospital last night because he was not feeling well.  Reports he has been drinking alcohol and had done crack cocaine.  Reports the alcohol was making him feel sick.  Patient reports that he just got his Invega injection yesterday and has had no adverse reaction to it.  Patient reports that he is feeling pretty good this morning and he denies suicidal/self-harm/homicidal ideations, auditory/visual hallucinations, and paranoia.  Patient reports that he is aware that he has an appointment tomorrow for his injection but will not need since the injection was given yesterday.  Patient reports he is homeless and has no support.  States he has a brother that lives in [place].  Patient also reports that he is aware of the interactive resource center.  Patient informed we will reschedule his appointments for his long-acting injectable and intake at [facility].  Patient also voiced he was interested in substance use services. ?During evaluation Tyler White is laying in bed in no acute distress.  He is alert, oriented x 4, calm and cooperative.  His mood is dysphoric with congruent affect.  He does not appear to be  responding to internal/external stimuli or delusional thoughts.  Patient denies suicidal/self-harm/homicidal ideation, psychosis, and paranoia.  Patient answered question appropriately. ? ? ?Past Psychiatric History: See below ? ?Risk to Self: Denies ?Risk to Others: Denies ?Prior Inpatient Therapy: Yes ?Prior Outpatient Therapy: Yes ? ?Past Medical History:  ?Past Medical History:  ?Diagnosis Date  ? Hernia, inguinal, right   ? Inguinal hernia   ? right  ? Psychiatric illness   ? Schizophrenia ([place])   ? History reviewed. No pertinent surgical history. ?Family History:  ?Family History  ?Problem Relation Age of Onset  ? Psychiatric Illness Mother   ? Hypertension Sister   ? ?Family Psychiatric  History: None reported ?Social History:  ?Social History  ? ?Substance and Sexual Activity  ?Alcohol Use Not Currently  ?   ?Social History  ? ?Substance and Sexual Activity  ?Drug Use Not Currently  ? Types: Marijuana, Cocaine  ? Comment: denies currently  ?  ?Social History  ? ?Socioeconomic History  ? Marital status: Single  ?  Spouse name: Not on file  ? Number of children: Not on file  ? Years of education: 10  ? Highest education level: Not on file  ?Occupational History  ? Not on file  ?Tobacco Use  ? Smoking status: Every Day  ?  Packs/day: 0.50  ?  Years: 5.00  ?  Pack years: 2.50  ?  Types: Cigarettes  ? Smokeless tobacco: Never  ?Vaping Use  ? Vaping Use: Never used  ?Substance and Sexual Activity  ? Alcohol use: Not Currently  ? Drug use: Not Currently  ?  Types: Marijuana, Cocaine  ?  Comment: denies currently  ? Sexual activity: Yes  ?  Birth control/protection: None  ?Other Topics Concern  ? Not on file  ?Social History Narrative  ? ** Merged History Encounter **  ?    ? ** Merged History Encounter **  ?    ? ?Social Determinants of Health  ? ?Financial Resource Strain: Not on file  ?Food Insecurity: Not on file  ?Transportation Needs: Not on file  ?Physical Activity: Not on file  ?Stress: Not on file  ?Social  Connections: Not on file  ? ?Additional Social History: ?  ? ?Allergies:  No Known Allergies ? ?Labs:  ?Results for orders placed or performed during the hospital encounter of 05/01/22 (from the past 48 hour(s))  ?CBC     Status: None  ? Collection Time: 05/01/22  6:16 AM  ?Result Value Ref Range  ? WBC 8.8 4.0 - 10.5 K/uL  ? RBC 4.68 4.22 - 5.81 MIL/uL  ? Hemoglobin 13.3 13.0 - 17.0 g/dL  ? HCT 39.1 39.0 - 52.0 %  ? MCV 83.5 80.0 - 100.0 fL  ? MCH 28.4 26.0 - 34.0 pg  ? MCHC 34.0 30.0 - 36.0 g/dL  ? RDW 14.1 11.5 - 15.5 %  ? Platelets 302 150 - 400 K/uL  ? nRBC 0.0 0.0 - 0.2 %  ?  Comment: Performed at [facility], [place] [address] [place], [place] [address]  ?Basic metabolic panel     Status: Abnormal  ? Collection Time: 05/01/22  6:16 AM  ?Result Value Ref Range  ? Sodium 139 135 - 145 mmol/L  ? Potassium 3.3 (L) 3.5 - 5.1 mmol/L  ? Chloride 107 98 - 111 mmol/L  ? CO2 22 22 - 32 mmol/L  ? Glucose, Bld 82 70 - 99 mg/dL  ?  Comment: Glucose reference range applies only to samples taken after fasting for at least 8 hours.  ? BUN 12 6 - 20 mg/dL  ? Creatinine, Ser 0.85 0.61 - 1.24 mg/dL  ? Calcium 8.8 (L) 8.9 - 10.3 mg/dL  ? GFR, Estimated >60 >60 mL/min  ?  Comment: (NOTE) ?Calculated using the CKD-EPI Creatinine Equation (2021) ?  ? Anion gap 10 5 - 15  ?  Comment: Performed at [facility], [place] [address], [place], [place] [address]  ?Salicylate level     Status: Abnormal  ? Collection Time: 05/01/22  6:16 AM  ?Result Value Ref Range  ? Salicylate Lvl <7.1 (L) 7.0 - 30.0 mg/dL  ?  Comment: Performed at [facility], [place] [address] [place], [place] [address]  ?Ethanol     Status: Abnormal  ? Collection Time: 05/01/22  6:16 AM  ?Result Value Ref Range  ? Alcohol, Ethyl (B) 47 (H) <10 mg/dL  ?  Comment: (NOTE) ?Lowest detectable limit for serum alcohol is 10 mg/dL. ? ?For medical purposes only. ?Performed at [facility], [place] [address], [place], [place] ?[address] ?  ?Resp Panel by RT-PCR  (Flu A&B, Covid) Nasopharyngeal Swab     Status: None  ? Collection Time: 05/01/22  9:34 AM  ? Specimen: Nasopharyngeal Swab; Nasopharyngeal(NP) swabs in vial transport medium  ?Result Value Ref Range  ? SARS Coronavirus 2 by RT PCR NEGATIVE NEGATIVE  ?  Comment: (NOTE) ?SARS-CoV-2 target nucleic acids are NOT DETECTED. ? ?The SARS-CoV-2 RNA is generally detectable in upper respiratory ?specimens during the acute phase of infection. The lowest ?concentration  of SARS-CoV-2 viral copies this assay can detect is ?138 copies/mL. A negative result does not preclude SARS-Cov-2 ?infection and should not be used as the sole basis for treatment or ?other patient management decisions. A negative result may occur with  ?improper specimen collection/handling, submission of specimen other ?than nasopharyngeal swab, presence of viral mutation(s) within the ?areas targeted by this assay, and inadequate number of viral ?copies(<138 copies/mL). A negative result must be combined with ?clinical observations, patient history, and epidemiological ?information. The expected result is Negative. ? ?Fact Sheet for Patients:  ?EntrepreneurPulse.com.au ? ?Fact Sheet for Healthcare Providers:  ?IncredibleEmployment.be ? ?This test is no t yet approved or cleared by the [place] FDA and  ?has been authorized for detection and/or diagnosis of SARS-CoV-2 by ?FDA under an Emergency Use Authorization (EUA). This EUA will remain  ?in effect (meaning this test can be used) for the duration of the ?COVID-19 declaration under Section 564(b)(1) of the Act, 21 ?U.S.C.section 360bbb-3(b)(1), unless the authorization is terminated  ?or revoked sooner.  ? ? ?  ? Influenza A by PCR NEGATIVE NEGATIVE  ? Influenza B by PCR NEGATIVE NEGATIVE  ?  Comment: (NOTE) ?The Xpert Xpress SARS-CoV-2/FLU/RSV plus assay is intended as an aid ?in the diagnosis of influenza from Nasopharyngeal swab specimens and ?should not be used as  a sole basis for treatment. Nasal washings and ?aspirates are unacceptable for Xpert Xpress SARS-CoV-2/FLU/RSV ?testing. ? ?Fact Sheet for Patients: ?EntrepreneurPulse.com.au ? ?Fact Sheet for

## 2022-05-01 NOTE — ED Notes (Signed)
Patient on TTS 

## 2022-05-01 NOTE — ED Provider Notes (Addendum)
?[facility] ?Provider Note ? ? ?CSN: [id number] ?Arrival date & time: 05/01/22  0511 ? ?  ? ?History ? ?Chief Complaint  ?Patient presents with  ? Suicidal  ? Drug Problem  ? ? ?Tyler White is a 26 y.o. male with history of schizophrenia brought into the ED via EMS for evaluation of suicidal ideation without plan.  Patient presented to [employer] station yesterday complaining of "not feeling well" and feeling suicidal.  Patient endorses crack cocaine usage and alcohol last night.  Currently still feels suicidal but denies plan, no homicidal intent.  Patient takes [place] and Risperdal though he notes that he has not taken his medications in approximately 1 month.  Admits to visual and auditory hallucinations.  Patient was seen here in the emergency department 3 days ago for similar complaints and was admitted and discharged home the next day. ? ?Patient denies all physical complaints. ? ? ?Drug Problem ? ? ?  ? ?Home Medications ?Prior to Admission medications   ?Medication Sig Start Date End Date Taking? Authorizing Provider  ?benztropine (COGENTIN) 0.5 MG tablet Take 1 tablet (0.5 mg total) by mouth 2 (two) times daily. ?Patient not taking: Reported on 05/01/2022 04/16/22   Tharon Aquas, NP  ?paliperidone (INVEGA SUSTENNA) 234 MG/1.5ML SUSY injection Inject 234 mg into the muscle once for 1 dose. ?Patient not taking: Reported on 05/01/2022 04/16/22 04/29/22  Tharon Aquas, NP  ?propranolol (INDERAL) 20 MG tablet Take 1 tablet (20 mg total) by mouth 2 (two) times daily. ?Patient not taking: Reported on 05/01/2022 04/16/22   Tharon Aquas, NP  ?risperiDONE (RISPERDAL) 2 MG tablet Take 1 tablet (2 mg total) by mouth at bedtime. ?Patient not taking: Reported on 05/01/2022 04/16/22   Tharon Aquas, NP  ?traZODone (DESYREL) 50 MG tablet Take 1 tablet (50 mg total) by mouth at bedtime as needed for sleep. ?Patient not taking: Reported on 05/01/2022 04/16/22   Tharon Aquas,  NP  ?gabapentin (NEURONTIN) 400 MG capsule Take 1 capsule (400 mg total) by mouth 3 (three) times daily. ?Patient not taking: Reported on 06/09/2021 04/30/21 06/10/21  Ethelene Hal, NP  ?   ? ?Allergies    ?Patient has no known allergies.   ? ?Review of Systems   ?Review of Systems ? ?Physical Exam ?Updated Vital Signs ?BP 125/86 (BP Location: Right Arm)   Pulse 72   Temp 98 ?F (36.7 ?C) (Oral)   Resp 16   Ht '5\' 8"'$  (1.727 m)   Wt 70.3 kg   SpO2 100%   BMI 23.57 kg/m?  ?Physical Exam ?Vitals and nursing note reviewed.  ?Constitutional:   ?   General: He is not in acute distress. ?   Appearance: He is not ill-appearing.  ?HENT:  ?   Head: Atraumatic.  ?Eyes:  ?   Conjunctiva/sclera: Conjunctivae normal.  ?Cardiovascular:  ?   Rate and Rhythm: Normal rate and regular rhythm.  ?   Pulses: Normal pulses.  ?   Heart sounds: No murmur heard. ?Pulmonary:  ?   Effort: Pulmonary effort is normal. No respiratory distress.  ?   Breath sounds: Normal breath sounds.  ?Abdominal:  ?   General: Abdomen is flat. There is no distension.  ?   Palpations: Abdomen is soft.  ?   Tenderness: There is no abdominal tenderness.  ?Musculoskeletal:     ?   General: Normal range of motion.  ?   Cervical back: Normal range of motion.  ?  Skin: ?   General: Skin is warm and dry.  ?   Capillary Refill: Capillary refill takes less than 2 seconds.  ?Neurological:  ?   General: No focal deficit present.  ?   Mental Status: He is alert.  ?Psychiatric:     ?   Mood and Affect: Mood normal. Affect is flat.     ?   Speech: Speech is delayed.     ?   Behavior: Behavior is withdrawn.     ?   Thought Content: Thought content is not paranoid. Thought content includes suicidal ideation. Thought content does not include homicidal ideation. Thought content does not include suicidal plan.  ?   Comments: Not responding to internal stimuli  ? ? ?ED Results / Procedures / Treatments   ?Labs ?(all labs ordered are listed, but only abnormal results are  displayed) ?Labs Reviewed  ?BASIC METABOLIC PANEL - Abnormal; Notable for the following components:  ?    Result Value  ? Potassium 3.3 (*)   ? Calcium 8.8 (*)   ? All other components within normal limits  ?SALICYLATE LEVEL - Abnormal; Notable for the following components:  ? Salicylate Lvl <1.0 (*)   ? All other components within normal limits  ?ETHANOL - Abnormal; Notable for the following components:  ? Alcohol, Ethyl (B) 47 (*)   ? All other components within normal limits  ?RESP PANEL BY RT-PCR (FLU A&B, COVID) ARPGX2  ?CBC  ?RAPID URINE DRUG SCREEN, HOSP PERFORMED  ? ? ?EKG ?None ? ?Radiology ?No results found. ? ?Procedures ?Procedures  ? ? ?Medications Ordered in ED ?Medications - No data to display ? ?ED Course/ Medical Decision Making/ A&P ?  ?                        ?Medical Decision Making ? ?26 year old male in no acute distress, nontoxic-appearing presents to the ED for evaluation of suicidal ideation.  Patient is withdrawn with flat affect.  Not responding to internal stimuli.  Physical exam benign, vitals are normal.  No acute findings.  TTS consult placed to determine behavioral health disposition and was evaluated by Jalene Mullet who clears patient psychiatrically. Resources for rehab provided in AVS and pt was d/c home in stable condition. ? ?Final Clinical Impression(s) / ED Diagnoses ?Final diagnoses:  ?Suicidal thoughts  ? ? ?Rx / DC Orders ?ED Discharge Orders   ? ? None  ? ?  ? ? ?  ?Tonye Pearson, [place] ?05/01/22 0712 ? ?  ?Tonye Pearson, [place] ?05/01/22 1145 ? ?  ?Blanchie Dessert, MD ?05/01/22 1342 ? ?

## 2022-05-01 NOTE — ED Triage Notes (Signed)
Pt arrives via [facility] after presenting to [employer] station c/o "not feeling well and suicidal". Pt denies suicide plan. Pt endorses alcohol and crack cocaine usage earlier tonight. Pt withdrawn during triage ?

## 2022-05-01 NOTE — ED Notes (Signed)
Went to discharge patient and unable to locate him. He was being discharged with outside resources. Unable to go over discharge instructions due to him leaving.  ?

## 2022-05-01 NOTE — ED Notes (Signed)
Pt belongings inventoried, placed in purple zone, cabinet 7 ?

## 2022-05-02 ENCOUNTER — Ambulatory Visit (HOSPITAL_COMMUNITY): Payer: Medicaid Other

## 2022-05-03 ENCOUNTER — Ambulatory Visit (HOSPITAL_COMMUNITY)
Admission: EM | Admit: 2022-05-03 | Discharge: 2022-05-03 | Disposition: A | Discharge status: Home / Self Care | Payer: Medicaid Other | Attending: Registered Nurse | Admitting: Registered Nurse

## 2022-05-03 DIAGNOSIS — F250 Schizoaffective disorder, bipolar type: Secondary | ICD-10-CM | POA: Insufficient documentation

## 2022-05-03 DIAGNOSIS — R45851 Suicidal ideations: Secondary | ICD-10-CM | POA: Insufficient documentation

## 2022-05-03 DIAGNOSIS — Z5900 Homelessness unspecified: Secondary | ICD-10-CM | POA: Insufficient documentation

## 2022-05-03 DIAGNOSIS — F1994 Other psychoactive substance use, unspecified with psychoactive substance-induced mood disorder: Secondary | ICD-10-CM | POA: Diagnosis present

## 2022-05-03 DIAGNOSIS — Z765 Malingerer [conscious simulation]: Secondary | ICD-10-CM | POA: Insufficient documentation

## 2022-05-03 DIAGNOSIS — F1910 Other psychoactive substance abuse, uncomplicated: Secondary | ICD-10-CM | POA: Insufficient documentation

## 2022-05-03 NOTE — ED Provider Notes (Signed)
Behavioral Health Urgent Care Medical Screening Exam ? ?Patient Name: Josia Cueva ?MRN: [id number] ?Date of Evaluation: 05/03/22 ?Chief Complaint:   ?Diagnosis:  ?Final diagnoses:  ?Homelessness  ?Malingering  ?Polysubstance abuse ([place])  ?Schizoaffective disorder, bipolar type ([facility])  ? ? ?History of Present illness: Dmarion Perfect is a 26 y.o. male patient presented to [facility] as a walk in with complaints of suicidal ideation with no plan, homicidal ideation with no plan, and asking for food and to got to [facility] ? ?[name], 74 y.o., male patient seen face to face by this provider, consulted with Dr. Ernie Hew; and chart reviewed on 05/03/22.  On evaluation Traevon Meiring reports he is just ready for discharge.  Patient denies suicidal ideation, psychosis, and paranoia.  Patient states that he did not make it to his appointment at [facility] yesterday.  "I didn't go.  I missed my appointment."  Patient informed that his appointment had been rescheduled since he had gotten his Invega injection while he was in the emergency room.  Patient also admits that he is aware of the [employer].  Informed he needed to go to get assistance with shelter and also offered showers and telephone use.  Patient states he would go today but needed a bus pas.   ?During evaluation Jantz Main is found laying on bench sleeping.  There is no noted distress.  Once he is awakened he states he is ready for discharge.  He is alert/oriented x 4; calm/cooperative; and mood congruent with affect.  He is speaking in a clear tone at decreased volume, and normal pace; with good fair contact.  Hi thought process is coherent and relevant; There is no indication that he is currently responding to internal/external stimuli or experiencing delusional thought content; and he has denied suicidal/self-harm/homicidal ideation, psychosis, and paranoia.  Patient has remained calm throughout  assessment and has answered questions appropriately.   ? ? ?This Probation officer sat with patient and went over discharge instruction.  Patient was given a bus pass to get to [facility] and food.   ? ? ? ?Psychiatric Specialty Exam ? ?Presentation  ?General Appearance:Casual; Disheveled (Odorous) ? ?Eye Contact:Good ? ?Speech:Clear and Coherent ? ?Speech Volume:Decreased ? ?Handedness:Right ? ? ?Mood and Affect  ?Mood:Dysphoric ? ?Affect:Congruent ? ? ?Thought Process  ?Thought Processes:Coherent ? ?Descriptions of Associations:Intact ? ?Orientation:Full (Time, Place and Person) ? ?Thought Content:WDL; Logical ? Diagnosis of Schizophrenia or Schizoaffective disorder in past: Yes ? Duration of Psychotic Symptoms: Greater than six months ? Hallucinations:None ?NA ?Denies voices at this time states the last was yesterday ?Denies at this time ? ?Ideas of Reference:None ? ?Suicidal Thoughts:No ?Without Intent; Without Plan; Without Means to Carry Out; Without Access to Means ?Without Intent ? ?Homicidal Thoughts:No ? ? ?Sensorium  ?Memory:Immediate Good; Recent Good; Remote Fair ? ?Judgment:Fair ? ?Insight:Fair; Shallow ? ? ?Executive Functions  ?Concentration:Good ? ?Attention Span:Good ? ?Recall:Good ? ?Fund of [employer] ? ?Language:Good ? ? ?Psychomotor Activity  ?Psychomotor Activity:Normal ?-- (NA) ?No ? ? ?Assets  ?Assets:Communication Skills; Desire for Improvement ? ? ?Sleep  ?Sleep:Good ? ?Number of hours: 6 ? ? ?Nutritional Assessment (For OBS and FBC admissions only) ?Has the patient had a weight loss or gain of 10 pounds or more in the last 3 months?: No ?Has the patient had a decrease in food intake/or appetite?: No ?Does the patient have dental problems?: No ?Does the patient have eating habits or behaviors that may be indicators of an eating disorder including  binging or inducing vomiting?: No ?Has the patient recently lost weight without trying?: 0 ?Has the patient been eating poorly because of a decreased  appetite?: 0 ?Malnutrition Screening Tool Score: 0 ? ? ? ?Physical Exam: ?Physical Exam ?Vitals and nursing note reviewed. Exam conducted with a chaperone present.  ?Constitutional:   ?   General: He is not in acute distress. ?   Appearance: Normal appearance. He is not ill-appearing.  ?   Comments: Still wearing hospital scrubs and body odor  ?Cardiovascular:  ?   Rate and Rhythm: Normal rate.  ?Pulmonary:  ?   Effort: Pulmonary effort is normal.  ?Musculoskeletal:     ?   General: Normal range of motion.  ?   Cervical back: Normal range of motion.  ?Skin: ?   General: Skin is warm and dry.  ?Neurological:  ?   Mental Status: He is alert and oriented to person, place, and time.  ?Psychiatric:     ?   Attention and Perception: Attention and perception normal. He does not perceive auditory or visual hallucinations.     ?   Mood and Affect: Affect normal. Mood is depressed.     ?   Speech: Speech normal.     ?   Behavior: Behavior normal. Behavior is cooperative.     ?   Thought Content: Thought content is not paranoid or delusional. Thought content does not include homicidal or suicidal ideation.     ?   Cognition and Memory: Cognition and memory normal.     ?   Judgment: Judgment is impulsive.  ? ?Review of Systems  ?Constitutional: Negative.   ?HENT: Negative.    ?Eyes: Negative.   ?Respiratory: Negative.    ?Cardiovascular: Negative.   ?Gastrointestinal: Negative.   ?Genitourinary: Negative.   ?Musculoskeletal: Negative.   ?Skin: Negative.   ?Neurological: Negative.   ?Endo/Heme/Allergies: Negative.   ?Psychiatric/Behavioral:  Positive for depression and substance abuse. Negative for hallucinations and suicidal ideas. The patient is not nervous/anxious and does not have insomnia.   ?     At presentation patient asking for food.  At time of assessment patient states that he is ready to be discharged. ? ?Reports that he did not make it to his appointment yesterday and was informed that appointment had been  rescheduled since he had already gotten his Invega injection.  ?Blood pressure 121/86, pulse 71, temperature 98.4 ?F (36.9 ?C), temperature source Oral, resp. rate 16, SpO2 100 %. There is no height or weight on file to calculate BMI. ? ?Musculoskeletal: ?Strength & Muscle Tone: within normal limits ?Gait & Station: normal ?Patient leans: N/A ? ? ?[facility] MSE Discharge Disposition for Follow up and Recommendations: ?Based on my evaluation the patient does not appear to have an emergency medical condition and can be discharged with resources and follow up care in outpatient services for Medication Management, Substance Abuse Intensive Outpatient Program, and Individual Therapy ? ? ? ?Discharge Instructions   ? ?  ?For your mental health needs you are advised to continue treatment with [facility].  You are scheduled for appointments at the [facility] on Thursday, May 30, 2022, at 1:30 pm and with Alvera Singh, LCSW for therapy on Wednesday, July 03, 2022 at 1:00 pm:  [facility],  [place], [place] [address].  Phone:   [phone] ?  ?To help you maintain a sober lifestyle, a substance use disorder treatment program may be beneficial to  you.  Contact one of the following providers at your earliest opportunity to ask about enrolling their program: ?  ?RESIDENTIAL PROGRAMS: ?  ?     [facility] ?     [address] ?     [place], [place] [address] ?     [phone] ?  ?     [facility] ?     [place]     [place], [place] [address] ?     [phone] ? ? ? ?Substance Abuse Treatment Programs ? ?Intensive Outpatient Programs ?[place]     ?[place]      ?[place], [place]                   ?[phone]      ? ?[facility] ?[address] ?[place], [place] ?[phone] ? ?[facility]     ?(Inpatient and outpatient)     ?[address]           ?[phone]   ? ?[place] ?[phone] (Suboxone and Methadone) ? ?[address], [place] [address]      ?[phone]      ? ?[address] ?[place], [place] ?[place] ? ?[facility] (

## 2022-05-03 NOTE — Discharge Instructions (Addendum)
For your mental health needs you are advised to continue treatment with [facility].  You are scheduled for appointments at the [facility] on Thursday, May 30, 2022, at 1:30 pm and with Alvera Singh, LCSW for therapy on Wednesday, July 03, 2022 at 1:00 pm:  [facility],  [place], [place] [address].  Phone:   [phone] ?  ?To help you maintain a sober lifestyle, a substance use disorder treatment program may be beneficial to you.  Contact one of the following providers at your earliest opportunity to ask about enrolling their program: ?  ?RESIDENTIAL PROGRAMS: ?  ?     [facility] ?     [address] ?     [place], [place] [address] ?     [phone] ?  ?     [facility]idential Treatment Services ?     [place]     [place], [place] [address] ?     [phone] ? ? ? ?Substance Abuse Treatment Programs ? ?Intensive Outpatient Programs ?[place]     ?[place]      ?[place], [place]                   ?[phone]      ? ?[facility] ?[address] ?[place], [place] ?[phone] ? ?[facility]     ?(Inpatient and outpatient)     ?[address]           ?[phone]   ? ?[place] ?[phone] (Suboxone and Methadone) ? ?[address], [place] [address]      ?[phone]      ? ?[address] ?[place], [place] ?[place] ? ?[facility] (Outpatient/Inpatient, Chemical)    ?(insurance only) [phone]      ?       ?[facility] (Groups & Residential) ?[place], [place] ?[phone] ? ?   ?[facility]     ?[place], [place]      ?[phone]      ? ?[facility] (for caregivers and family) ?[address] ?[place], [place] ?[phone] ? ? ? ? ? ?Residential Treatment Programs ?[place]      ?[address] [place], [place] [address]  ?[phone]  [phone]      ? ?[facility] ?[address], [place] [address] ?[phone] ? ?[facility] [place]        ?[phone]      ? ?[facility] ?[phone] ? ?[facility] ([facility]             ?[place]                                         ?[place], [place]                                                ?[phone] or [phone]                              ? ?[facility] ?[address] ?[place], [address] ?[phone] ? ?[facility]    ?[place], [place]     ?[phone]      ? ?[facility] ?[place], [place] ?[phone] ? ?[place]   ?[place]     ?[place], [place] [address]     ?[phone]      ?Admissions: 8am-3pm M-F ? ?[facility] ([facility]) ?[address] ?[place], [place] ?[phone] ? ?[facility]: Residential Program (90 Days)   ?[place], [place]      ?[phone] or [phone]    ? ?[facility]: [place], [place] ?(Walk in Hours over the weekend or by referral) ? ?[place] ?[place], [place], [place] [address] ?[phone] ? ?Crisis Mobile: [facility]:  [phone] (for crisis response 24 hours a day) ?[place] Hotline:      [phone] ?Outpatient Psychiatry and Counseling ? ?[facility]: Mobile Crisis Management 24 hours:  [phone] ? ?[facility] of the [employer] sliding scale fee and walk in schedule: M-F 8am-12pm/1pm-3pm ?[place]  ?[place], [place] [address] ?[phone] ? ?[facility] ?[place] ?[place], [place] [address] ?[phone] ? ?[facility] (Former[facility])- new patient walk-in appointments available Monday - Friday 8am -3pm.          ?[address] ?[place], [place] [address] ?[phone] or crisis line- [phone] ? ?[facility]  [facility]/ Intensive Outpatient Therapy Program ?[address] ?[place], [place] [address] ?[phone] ? ?[place]                  ?Crisis Services      ?[phone]      ?[address]     ?[place], [place] [address]                ? ?[place]   ?[facility] ?[phone] ?[address] ?[place], [place] [address] ? ? ?[facility]          ?[address],  ?[place], [place] [address]       ?[phone] ? ?[facility] ?[address] ?[place], [place] [address] ?[phone] ? ?[facility]    ?[address]    ?[place], [place] [address]     ?[phone]      ? ?Sheralyn Boatman, MD     ?[address]     ?[place] [address]     ?[phone]     ?  ?[place] ?[place] [address] ? ?[facility]     ?[address]     ?[place], [place]      ?[phone]      ? ?[place] ?Marlou Sa, MD ?[address] ?[place], [place] [address] ?[phone] ? ?[facility]     ?[address] [phone]     ?[place], [place] [address]     ?[phone]      ? ?[facility] ?[place], [place] [address] ?[phone] ?Resources for Temporary Residential Assistance/Crisis Centers ? ?DAY CENTERS ?[facility]) ?M-F 8am-3pm   ?[address] [place],  [address]   [phone] ?Services include: laundry, barbering, support groups, case management, phone  &  computer access, showers, AA/NA mtgs, mental health/substance abuse nurse, job skills class, disability information, VA assistance, spiritual classes, etc.  ? ?[facility] ? ?[employer]     ?[place]   ?[address] [place]     ?[phone]       ?       ?[facility] (women and children)       ?[place], [place]  [address] ?[phone] ?[email or web address] for application and process ?Application Required ? ?[facility]   ?[address]    ?[place] [address]     ?[phone]       ?             ?[place] ?[place] [address] ?[place], [place] [address] ?[phone] ?[phone]([email or web address] application appt.) ?Application Required ? ?[facility] (women only)    ?[place]     ?[place], [place] [address]     ?[phone]      ?Intake starts 6pm daily ?Need valid ID, SSC, & Police report ?[place] ?[address] ?[place], [place] ?[phone] ?Application Required ? ?[employer] (men only)     ?[place]      [place], [place]     ?[phone]      ? ?[facility] ?(Pregnant women only) ?[place], [place] ?[phone] ? ?[facility]      ?[address]      [place], [place] [address]     ?[phone]      ?       ?[facility] ?[place], [place] ?[phone] ?90 day commitment/SA/Application process ? ?[facility](men only)     ?[place]     ?[place], [place]     ?[phone]       ?Check-in at 7pm     ?       ?[facility] [place] ?[address] ?[place], [place] [address] ?[phone] ?Men/Women/Women and Children must be there by 7 pm ? ?[facility] ?[place], [place] ?[phone]                ? ? ?[place] ?[place], [place] [address] ? ?Hours ?Monday - Friday: ?Services: 8:00AM - 3:00PM ?Offices: 8:00AM - 5:00PM ? ?The [facility] helps people reconnect ?This is a safe place to rest, take care of basic needs and access the services and community that make all the difference. Our guests come to the [facility] to take a class, do laundry, meet with a case manager or to get their mail. Sometimes they just need to sit in our dayroom and enjoy a conversation.  Here you will find  everything from shower facilities to a computer lab, a mail room, classrooms and meeting spaces.  The [facility] helps people reconnect with their own lives and with the community at large. ? ?A caring community setting ?One of the most exciting aspects of the [facility] i

## 2022-05-03 NOTE — Progress Notes (Signed)
Pt reports SI but no plan or intention.  Hearing voices.  Some HI but with no plan or intention.  Pt denies visual hallucinations.  Pt is homeless and asks about getting food.  Pt wants to go to [facility].  Pt is routine.   ?

## 2022-05-04 ENCOUNTER — Emergency Department (HOSPITAL_COMMUNITY)
Admission: EM | Admit: 2022-05-04 | Discharge: 2022-05-04 | Disposition: A | Discharge status: Home / Self Care | Payer: Medicaid Other | Attending: Emergency Medicine | Admitting: Emergency Medicine

## 2022-05-04 ENCOUNTER — Emergency Department (HOSPITAL_COMMUNITY): Payer: Medicaid Other

## 2022-05-04 ENCOUNTER — Other Ambulatory Visit: Payer: Self-pay

## 2022-05-04 ENCOUNTER — Encounter (HOSPITAL_COMMUNITY): Payer: Self-pay

## 2022-05-04 DIAGNOSIS — Z5900 Homelessness unspecified: Secondary | ICD-10-CM | POA: Diagnosis not present

## 2022-05-04 DIAGNOSIS — R7309 Other abnormal glucose: Secondary | ICD-10-CM | POA: Insufficient documentation

## 2022-05-04 DIAGNOSIS — R079 Chest pain, unspecified: Secondary | ICD-10-CM | POA: Insufficient documentation

## 2022-05-04 LAB — BASIC METABOLIC PANEL
Anion gap: 6 (ref 5–15)
BUN: 17 mg/dL (ref 6–20)
CO2: 27 mmol/L (ref 22–32)
Calcium: 9.2 mg/dL (ref 8.9–10.3)
Chloride: 109 mmol/L (ref 98–111)
Creatinine, Ser: 1.22 mg/dL (ref 0.61–1.24)
GFR, Estimated: >60 mL/min (ref >60)
Glucose, Bld: 66 mg/dL — ABNORMAL LOW (ref 70–99)
Potassium: 3.9 mmol/L (ref 3.5–5.1)
Sodium: 142 mmol/L (ref 135–145)

## 2022-05-04 LAB — CBG MONITORING, ED: Glucose-Capillary: 143 mg/dL — ABNORMAL HIGH (ref 70–99)

## 2022-05-04 LAB — CBC
HCT: 40.6 % (ref 39.0–52.0)
Hemoglobin: 13.5 g/dL (ref 13.0–17.0)
MCH: 28.4 pg (ref 26.0–34.0)
MCHC: 33.3 g/dL (ref 30.0–36.0)
MCV: 85.5 fL (ref 80.0–100.0)
Platelets: 325 10*3/uL (ref 150–400)
RBC: 4.75 MIL/uL (ref 4.22–5.81)
RDW: 14 % (ref 11.5–15.5)
WBC: 10 10*3/uL (ref 4.0–10.5)
nRBC: 0 % (ref 0.0–0.2)

## 2022-05-04 LAB — TROPONIN I (HIGH SENSITIVITY)
Troponin I (High Sensitivity): 4 ng/L (ref <18)
Troponin I (High Sensitivity): 4 ng/L (ref <18)

## 2022-05-04 NOTE — Care Management (Signed)
Patient is requesting a PART bus pass to get to [place]. We do not have the availability of these bus passes. He already has a local bus pass. He was seen here early AM hours. Checking with social work to see if there is any petty cash for [employer].  ?

## 2022-05-04 NOTE — ED Provider Notes (Signed)
?[facility] ?Provider Note ? ? ?CSN: [id number] ?Arrival date & time: 05/04/22  0107 ? ?  ? ?History ? ?Chief Complaint  ?Patient presents with  ? Chest Pain  ? ? ?Tyler White is a 26 y.o. male. ? ?Pt reported chest pain at triage.  Pt currently denies chest pain.  Pt states he does not feel well.  Pt covers his head with sheet while I try to talk to him.  ?Pt has a history of schizophrenia  and substance abuse.  Pt denies any substance abuse today.  He denies current suicide thought or plan.  No homicidal thoughts  ? ? ?Chest Pain ? ?  ? ?Home Medications ?Prior to Admission medications   ?Medication Sig Start Date End Date Taking? Authorizing Provider  ?paliperidone (INVEGA SUSTENNA) 234 MG/1.5ML SUSY injection Inject 234 mg into the muscle once for 1 dose. 04/16/22 05/03/22  Tharon Aquas, NP  ?gabapentin (NEURONTIN) 400 MG capsule Take 1 capsule (400 mg total) by mouth 3 (three) times daily. ?Patient not taking: Reported on 06/09/2021 04/30/21 06/10/21  Ethelene Hal, NP  ?   ? ?Allergies    ?Patient has no known allergies.   ? ?Review of Systems   ?Review of Systems  ?Cardiovascular:  Positive for chest pain.  ?All other systems reviewed and are negative. ? ?Physical Exam ?Updated Vital Signs ?BP 127/73 (BP Location: Left Arm)   Pulse 67   Temp 98.2 ?F (36.8 ?C) (Oral)   Resp 18   Ht '5\' 8"'$  (1.727 m)   Wt 70 kg   SpO2 100%   BMI 23.46 kg/m?  ?Physical Exam ?Vitals and nursing note reviewed.  ?Constitutional:   ?   General: He is not in acute distress. ?   Appearance: He is well-developed.  ?   Comments: sleepy  ?HENT:  ?   Head: Normocephalic and atraumatic.  ?Eyes:  ?   Conjunctiva/sclera: Conjunctivae normal.  ?Cardiovascular:  ?   Rate and Rhythm: Normal rate and regular rhythm.  ?   Heart sounds: No murmur heard. ?Pulmonary:  ?   Effort: Pulmonary effort is normal. No respiratory distress.  ?   Breath sounds: Normal breath sounds.  ?Abdominal:  ?    Palpations: Abdomen is soft.  ?   Tenderness: There is no abdominal tenderness.  ?Musculoskeletal:     ?   General: No swelling.  ?   Cervical back: Neck supple.  ?Skin: ?   General: Skin is warm and dry.  ?   Capillary Refill: Capillary refill takes less than 2 seconds.  ?Neurological:  ?   Mental Status: He is alert.  ?Psychiatric:     ?   Mood and Affect: Mood normal.  ? ? ?ED Results / Procedures / Treatments   ?Labs ?(all labs ordered are listed, but only abnormal results are displayed) ?Labs Reviewed  ?BASIC METABOLIC PANEL - Abnormal; Notable for the following components:  ?    Result Value  ? Glucose, Bld 66 (*)   ? All other components within normal limits  ?CBG MONITORING, ED - Abnormal; Notable for the following components:  ? Glucose-Capillary 143 (*)   ? All other components within normal limits  ?CBC  ?TROPONIN I (HIGH SENSITIVITY)  ?TROPONIN I (HIGH SENSITIVITY)  ? ? ?EKG ?EKG Interpretation ? ?Date/Time:  Saturday May 04 2022 05:52:05 EDT ?Ventricular Rate:  56 ?PR Interval:  146 ?QRS Duration: 88 ?QT Interval:  382 ?QTC Calculation: 368 ?R Axis:  92 ?Text Interpretation: Sinus bradycardia with sinus arrhythmia Rightward axis Borderline ECG When compared with ECG of 04-May-2022 01:12, PREVIOUS ECG IS PRESENT No significant change from earlier tracing Confirmed by Octaviano Glow [phone]) on 05/04/2022 8:08:57 AM ? ?Radiology ?DG Chest 2 View ? ?Result Date: 05/04/2022 ?CLINICAL DATA:  Chest pain. EXAM: CHEST - 2 VIEW COMPARISON:  Chest x-ray 04/17/2022. FINDINGS: The heart size and mediastinal contours are within normal limits. Both lungs are clear. The visualized skeletal structures are unremarkable. IMPRESSION: No active cardiopulmonary disease. Electronically Signed   By: Ronney Asters M.D.   On: 05/04/2022 02:09   ? ?Procedures ?Procedures  ? ? ?Medications Ordered in ED ?Medications - No data to display ? ?ED Course/ Medical Decision Making/ A&P ?  ?                        ?Medical Decision  Making ?Amount and/or Complexity of Data Reviewed ?Labs: ordered. ?Radiology: ordered. ? ? ?MDM:  EKG and chest xray no acute changes  Pt observed.  Pt more awake.  Pt denies concerns at discharge time  ? ? ? ? ? ? ? ?Final Clinical Impression(s) / ED Diagnoses ?Final diagnoses:  ?Nonspecific chest pain  ?Homeless  ? ? ?Rx / DC Orders ?ED Discharge Orders   ? ? None  ? ?  ? ?An After Visit Summary was printed and given to the patient.  ?  ?Fransico Meadow, PA-C ?05/04/22 1317 ? ?  ?Wyvonnia Dusky, MD ?05/04/22 1559 ? ?

## 2022-05-04 NOTE — ED Triage Notes (Signed)
Pt here with chest pain. Uncooperative during triage. Chest pain started today and is let sided. Pt denies SI/HI ?

## 2022-05-09 ENCOUNTER — Ambulatory Visit (HOSPITAL_COMMUNITY)
Admission: EM | Admit: 2022-05-09 | Discharge: 2022-05-09 | Disposition: A | Discharge status: Home / Self Care | Payer: Medicaid Other | Attending: Behavioral Health | Admitting: Behavioral Health

## 2022-05-09 DIAGNOSIS — F1911 Other psychoactive substance abuse, in remission: Secondary | ICD-10-CM | POA: Insufficient documentation

## 2022-05-09 DIAGNOSIS — Z8659 Personal history of other mental and behavioral disorders: Secondary | ICD-10-CM | POA: Insufficient documentation

## 2022-05-09 DIAGNOSIS — R443 Hallucinations, unspecified: Secondary | ICD-10-CM | POA: Insufficient documentation

## 2022-05-09 DIAGNOSIS — Z5900 Homelessness unspecified: Secondary | ICD-10-CM | POA: Insufficient documentation

## 2022-05-09 DIAGNOSIS — Z765 Malingerer [conscious simulation]: Secondary | ICD-10-CM | POA: Insufficient documentation

## 2022-05-09 NOTE — BH Assessment (Signed)
Tyler White is a 26 year old male who presents voluntarily to [facility], via [employer] and unaccompanied.  Pt reports he has a history of schizoaffective disorder.  Pt reports self harm with a plan to cut himself.  Pt reports hearing voices, "the voices tell me not to hurt anyone else".  Pt admits to prior MH diagnosis or prescribed medication for symptom management.  MSE signed by patient. ?

## 2022-05-09 NOTE — ED Notes (Signed)
Patient is alert and oriented X 4, denies SI, HI and AVH , pain level 0/10 at time of discharge. Patient received [employer] as well as a Visual merchandiser. All belongings from [facility] locker returned to patient. ?

## 2022-05-09 NOTE — ED Provider Notes (Signed)
Behavioral Health Urgent Care Medical Screening Exam ? ?Patient Name: Tyler White ?MRN: [id number] ?Date of Evaluation: 05/09/22 ?Diagnosis:  ?Final diagnoses:  ?Hallucination  ?Malingering  ? ? ?History of Present illness: Tyler White is a 26 y.o. male patient who presents to the [facility] voluntary as a walk -in accompanied by [employer].  ? ?Patient seen and evaluated face-to-face by this provider, and chart reviewed. Patient has a psychiatric history significant of schizoaffective disorder, bipolar type, polysubstance abuse, cocaine use, and malingering. Patient has been seen numerous times across the behavioral health service line for similar presentations. ? ?On evaluation, patient is alert and oriented x4. His thought process is logical and goal oriented. His speech is clear and coherent. His mood is dysphoric and affect is congruent. He is calm and cooperative. ? ?Patient states that he has been hearing voices and seeing stuff since he was 26 years old. He states that the hallucinations are constant. He states that he is unable to make out what the voices are saying. He states that he sees dark circles. AVH appears to be chronic in nature as the patient does not report new worsening symptoms. There is no objective evidence that the patient is currently responding to internal or external stimuli,  or exhibiting delusional or paranoia thought content.  ? ?Patient denies suicidal and homicidal ideations. He reports fair sleep, on average 4 to 5 hours. He reports a fair appetite. He states that he has been homeless for the past 10 months because he fell out with a lot of his family. He states, "I need a few days to rest." Patient was informed that the hospital is not designed for housing and is treatment focused. Patient then states, "what if I have suicidal thoughts because I don't have anywhere to go." Patient was advised that his suicidal ideations are contingent on housing. I discussed with the patient  resources for shelter. Patient states that he does not want to go to a shelter, and that he wants to go to behavioral health hospital.  ? ?Patient educated on community resources in the community for shelter. Patient informed of upcoming appointment at there [facility] outpatient for his next Inveg injection.  ? ? ?Upon detailed history, the patient has various encounters with similar presentations and lack of compliance with psychiatric evaluation, treatment regimen and follow up care. Patient's suicidality is conditioned on obtaining housing/shelter  and the patient appears to have secondary gain due to homelessness. Patient endorses suicidal ideations to obtain housing.  ? ? ?Psychiatric Specialty Exam ? ?Presentation  ?General Appearance:Disheveled ? ?Eye Contact:Minimal ? ?Speech:Clear and Coherent ? ?Speech Volume:Normal ? ?Handedness:Right ? ? ?Mood and Affect  ?Mood:Dysphoric ? ?Affect:Congruent ? ?Thought Process  ?Thought Processes:Coherent; Goal Directed ? ?Descriptions of Associations:Intact ? ?Orientation:Full (Time, Place and Person) ? ?Thought Content:Logical ? Diagnosis of Schizophrenia or Schizoaffective disorder in past: Yes ? Duration of Psychotic Symptoms: Greater than six months ? Hallucinations:Auditory; Visual ?NA ?Denies voices at this time states the last was yesterday ?Denies at this time ? ?Ideas of Reference:None ? ?Suicidal Thoughts:No ?Without Intent; Without Plan; Without Means to Carry Out; Without Access to Means ?Without Intent ? ?Homicidal Thoughts:No ? ? ?Sensorium  ?Memory:Immediate Fair; Remote Fair; Recent Fair ? ?Judgment:Fair ? ?Insight:Fair ? ? ?Executive Functions  ?Concentration:Fair ? ?Attention Span:Fair ? ?Recall:Fair ? ?Brandon ? ?Language:Fair ? ? ?Psychomotor Activity  ?Psychomotor Activity:Normal ?-- (NA) ?No ? ? ?Assets  ?Assets:Communication Skills; Desire for Improvement; Physical Health; Leisure Time ? ? ?Sleep  ?  Sleep:Fair ? ?Number of hours:  5 ? ?Physical Exam: ?Physical Exam ?Constitutional:   ?   Appearance: Normal appearance.  ?HENT:  ?   Head: Normocephalic.  ?   Nose: Nose normal.  ?Eyes:  ?   Conjunctiva/sclera: Conjunctivae normal.  ?Cardiovascular:  ?   Rate and Rhythm: Normal rate.  ?Pulmonary:  ?   Effort: Pulmonary effort is normal.  ?Musculoskeletal:     ?   General: Normal range of motion.  ?   Cervical back: Normal range of motion.  ?Neurological:  ?   Mental Status: He is alert and oriented to person, place, and time.  ? ?Review of Systems  ?Constitutional: Negative.   ?HENT: Negative.    ?Eyes: Negative.   ?Respiratory: Negative.    ?Cardiovascular: Negative.   ?Gastrointestinal: Negative.   ?Genitourinary: Negative.   ?Musculoskeletal: Negative.   ?Skin: Negative.   ?Neurological: Negative.   ?Endo/Heme/Allergies: Negative.   ?Blood pressure 121/90, pulse 60, temperature 97.8 ?F (36.6 ?C), temperature source Oral, resp. rate 17, SpO2 100 %. There is no height or weight on file to calculate BMI. ? ?Musculoskeletal: ?Strength & Muscle Tone: within normal limits ?Gait & Station: normal ?Patient leans: N/A ? ? ?[facility] MSE Discharge Disposition for Follow up and Recommendations: ?Based on my evaluation the patient does not appear to have an emergency medical condition and can be discharged with resources and follow up care in outpatient services for Medication Management and Individual Therapy ? ?Discharge recommendations:  ?Patient is to take medications as prescribed. ? ?You are scheduled for appointments at the [facility] on Thursday, May 30, 2022, at 1:30 pm and with Alvera Singh, LCSW for therapy on Wednesday, July 03, 2022 at 1:00 pm:  [facility],  [place], [place] [address].  Phone: [phone] ? ?Therapy: We recommend that patient participate in individual therapy to address mental health concerns. ? ?Medications: The parent/guardian is to contact a medical professional and/or  outpatient provider to address any new side effects that develop. Parent/guardian should update outpatient providers of any new medications and/or medication changes.  ? ?Atypical antipsychotics: If you are prescribed an atypical antipsychotic, it is recommended that your height, weight, BMI, blood pressure, fasting lipid panel, and fasting blood sugar be monitored by your outpatient providers. ? ?Safety:  ?The patient should abstain from use of illicit substances/drugs and abuse of any medications. ?If symptoms worsen or do not continue to improve or if the patient becomes actively suicidal or homicidal then it is recommended that the patient return to the closest hospital emergency department, the [facility], or call 911 for further evaluation and treatment. ?National Suicide Prevention Lifeline 1-800-SUICIDE or (519)622-7324. ? ?About 988 ?988 offers 24/7 access to trained crisis counselors who can help people experiencing mental health-related distress. People can call or text 988 or chat 988lifeline.org for themselves or if they are worried about a loved one who may need crisis support.  ? ?Marissa Calamity, NP ?05/09/2022, 7:49 AM ? ?

## 2022-05-09 NOTE — Discharge Instructions (Signed)
Discharge recommendations:  ?Patient is to take medications as prescribed. ?You are scheduled for appointments at the [facility] on Thursday, May 30, 2022, at 1:30 pm and with Alvera Singh, LCSW for therapy on Wednesday, July 03, 2022 at 1:00 pm:  [facility],  [place], [place] [address].  Phone: [phone] ? ?Therapy: We recommend that patient participate in individual therapy to address mental health concerns. ? ?Medications: The parent/guardian is to contact a medical professional and/or outpatient provider to address any new side effects that develop. Parent/guardian should update outpatient providers of any new medications and/or medication changes.  ? ?Atypical antipsychotics: If you are prescribed an atypical antipsychotic, it is recommended that your height, weight, BMI, blood pressure, fasting lipid panel, and fasting blood sugar be monitored by your outpatient providers. ? ?Safety:  ?The patient should abstain from use of illicit substances/drugs and abuse of any medications. ?If symptoms worsen or do not continue to improve or if the patient becomes actively suicidal or homicidal then it is recommended that the patient return to the closest hospital emergency department, the [facility], or call 911 for further evaluation and treatment. ?National Suicide Prevention Lifeline 1-800-SUICIDE or (240)244-7026. ? ?About 988 ?988 offers 24/7 access to trained crisis counselors who can help people experiencing mental health-related distress. People can call or text 988 or chat 988lifeline.org for themselves or if they are worried about a loved one who may need crisis support.  ? ? ?  ?

## 2022-05-14 ENCOUNTER — Encounter (HOSPITAL_COMMUNITY): Payer: Self-pay

## 2022-05-14 ENCOUNTER — Emergency Department (HOSPITAL_COMMUNITY): Payer: Medicaid Other

## 2022-05-14 ENCOUNTER — Emergency Department (HOSPITAL_COMMUNITY)
Admission: EM | Admit: 2022-05-14 | Discharge: 2022-05-14 | Disposition: A | Discharge status: Home / Self Care | Payer: Medicaid Other | Attending: Emergency Medicine | Admitting: Emergency Medicine

## 2022-05-14 DIAGNOSIS — M79672 Pain in left foot: Secondary | ICD-10-CM | POA: Diagnosis not present

## 2022-05-14 DIAGNOSIS — M79671 Pain in right foot: Secondary | ICD-10-CM | POA: Diagnosis present

## 2022-05-14 DIAGNOSIS — R440 Auditory hallucinations: Secondary | ICD-10-CM | POA: Diagnosis not present

## 2022-05-14 DIAGNOSIS — R45851 Suicidal ideations: Secondary | ICD-10-CM | POA: Diagnosis not present

## 2022-05-14 DIAGNOSIS — F209 Schizophrenia, unspecified: Secondary | ICD-10-CM

## 2022-05-14 DIAGNOSIS — R55 Syncope and collapse: Secondary | ICD-10-CM | POA: Insufficient documentation

## 2022-05-14 LAB — TROPONIN I (HIGH SENSITIVITY): Troponin I (High Sensitivity): 4 ng/L (ref <18)

## 2022-05-14 LAB — URINALYSIS, ROUTINE W REFLEX MICROSCOPIC
Bacteria, UA: NONE SEEN
Bilirubin Urine: NEGATIVE
Glucose, UA: NEGATIVE mg/dL
Hgb urine dipstick: NEGATIVE
Ketones, ur: NEGATIVE mg/dL
Nitrite: NEGATIVE
Protein, ur: NEGATIVE mg/dL
Specific Gravity, Urine: 1.029 (ref 1.005–1.030)
WBC, UA: >50 WBC/hpf — ABNORMAL HIGH (ref 0–5)
pH: 6 (ref 5.0–8.0)

## 2022-05-14 LAB — BASIC METABOLIC PANEL
Anion gap: 9 (ref 5–15)
BUN: 27 mg/dL — ABNORMAL HIGH (ref 6–20)
CO2: 23 mmol/L (ref 22–32)
Calcium: 9 mg/dL (ref 8.9–10.3)
Chloride: 107 mmol/L (ref 98–111)
Creatinine, Ser: 0.99 mg/dL (ref 0.61–1.24)
GFR, Estimated: >60 mL/min (ref >60)
Glucose, Bld: 76 mg/dL (ref 70–99)
Potassium: 3.8 mmol/L (ref 3.5–5.1)
Sodium: 139 mmol/L (ref 135–145)

## 2022-05-14 LAB — RAPID URINE DRUG SCREEN, HOSP PERFORMED
Amphetamines: NOT DETECTED
Barbiturates: NOT DETECTED
Benzodiazepines: NOT DETECTED
Cocaine: POSITIVE — AB
Opiates: NOT DETECTED
Tetrahydrocannabinol: POSITIVE — AB

## 2022-05-14 LAB — CBC
HCT: 38 % — ABNORMAL LOW (ref 39.0–52.0)
Hemoglobin: 13 g/dL (ref 13.0–17.0)
MCH: 28.9 pg (ref 26.0–34.0)
MCHC: 34.2 g/dL (ref 30.0–36.0)
MCV: 84.4 fL (ref 80.0–100.0)
Platelets: 285 10*3/uL (ref 150–400)
RBC: 4.5 MIL/uL (ref 4.22–5.81)
RDW: 13.8 % (ref 11.5–15.5)
WBC: 7.7 10*3/uL (ref 4.0–10.5)
nRBC: 0 % (ref 0.0–0.2)

## 2022-05-14 LAB — CBG MONITORING, ED: Glucose-Capillary: 107 mg/dL — ABNORMAL HIGH (ref 70–99)

## 2022-05-14 LAB — SALICYLATE LEVEL: Salicylate Lvl: <7 mg/dL — ABNORMAL LOW (ref 7.0–30.0)

## 2022-05-14 LAB — ACETAMINOPHEN LEVEL: Acetaminophen (Tylenol), Serum: <10 ug/mL — ABNORMAL LOW (ref 10–30)

## 2022-05-14 NOTE — Discharge Instructions (Addendum)
You came to the emergency department today to be evaluated after having an episode of passing out.  Your physical exam and lab results were reassuring.  This may have been due to dehydration, please ensure you stay well-hydrated moving forward. ? ?You also complained of pain to both of your feet.  The x-rays obtained did not show any broken bones or dislocations.  Please take Tylenol and ibuprofen as indicated below to help with your pain.  Please take Ibuprofen (Advil, motrin) and Tylenol (acetaminophen) to relieve your pain.   ? ?You may take up to 600 MG (3 pills) of normal strength ibuprofen every 8 hours as needed.   ?You make take tylenol, up to 1,000 mg (two extra strength pills) every 8 hours as needed.  ? ?It is safe to take ibuprofen and tylenol at the same time as they work differently.  ? Do not take more than 3,000 mg tylenol in a 24 hour period (not more than one dose every 8 hours.  Please check all medication labels as many medications such as pain and cold medications may contain tylenol.  Do not drink alcohol while taking these medications.  Do not take other NSAID'S while taking ibuprofen (such as aleve or naproxen).  Please take ibuprofen with food to decrease stomach upset. ? ?Please go to behavioral health urgent care for any emergent psychiatric needs.  Other than that please follow-up with psychiatric providers listed on resource paperwork. ? ?Get help right away if: ?You faint. ?You hit your head or are injured after fainting. ?You have any of these symptoms that may indicate trouble with your heart: ?Fast or irregular heartbeats (palpitations). ?Unusual pain in your chest, abdomen, or back. ?Shortness of breath. ?You have a seizure. ?You have a severe headache. ?You are confused. ?You have vision problems. ?You have severe weakness or trouble walking. ?You are bleeding from your mouth or rectum, or you have black or tarry stool. ?

## 2022-05-14 NOTE — ED Notes (Signed)
I provided reinforced discharge education based off of discharge instructions. Pt acknowledged and understood my education. Pt had no further questions/concerns for provider/myself.  °

## 2022-05-14 NOTE — ED Provider Notes (Signed)
?[facility] DEPT ?Provider Note ? ? ?CSN: [id number] ?Arrival date & time: 05/14/22  1449 ? ?  ? ?History ? ?Chief Complaint  ?Patient presents with  ? Near Syncope  ? Psychiatric Evaluation  ? ? ?Tyler White is a 26 y.o. male with history of schizophrenia.  Presents to the emergency department with a chief complaint of syncopal episode, bilateral foot pain, and auditory hallucinations. ? ?Patient reports that approximate 15 minutes prior to arrival in the emergency department he was laying on the ground outside when he started to feel lightheaded, had tunnel vision, and had a syncopal episode.  Patient is unsure how long he was unconscious for.  Patient reports no confusion upon regaining consciousness.  Denies any preceding chest pain, shortness of breath, sudden onset of headache.  Denies any chest pain, shortness of breath, or headache after his syncopal episode.  Patient reports that he was laying down and did not fall or injure himself with a syncopal episode. ? ?Patient also complains of bilateral foot pain.  Patient states that this has been present for multiple weeks.  Patient states that last month he had a syncopal episode where he fell and believes that is when his feet started hurting.  Pain is worse with touch and movement.  Patient has not tried any modalities to alleviate his pain. ? ?Patient also endorses auditory hallucinations.  States that he hears multiple voices.  Patient states "they asked specific questions about why I am not on medications."  Patient denies any visual hallucinations.  He does endorse suicidal and homicidal ideations.  Patient denies any active plan to act on these thoughts. ? ?Patient denies any illicit drug or alcohol use. ? ? ? ? ?Near Syncope ?Pertinent negatives include no chest pain, no abdominal pain, no headaches and no shortness of breath.  ? ?  ? ?Home Medications ?Prior to Admission medications   ?Medication Sig Start Date End Date  Taking? Authorizing Provider  ?paliperidone (INVEGA SUSTENNA) 234 MG/1.5ML SUSY injection Inject 234 mg into the muscle once for 1 dose. 04/16/22 05/03/22  Tharon Aquas, NP  ?gabapentin (NEURONTIN) 400 MG capsule Take 1 capsule (400 mg total) by mouth 3 (three) times daily. ?Patient not taking: Reported on 06/09/2021 04/30/21 06/10/21  Ethelene Hal, NP  ?   ? ?Allergies    ?Patient has no known allergies.   ? ?Review of Systems   ?Review of Systems  ?Constitutional:  Negative for chills and fever.  ?Eyes:  Negative for visual disturbance.  ?Respiratory:  Negative for shortness of breath.   ?Cardiovascular:  Positive for near-syncope. Negative for chest pain, palpitations and leg swelling.  ?Gastrointestinal:  Negative for abdominal pain, nausea and vomiting.  ?Genitourinary:  Negative for difficulty urinating, dysuria, frequency, hematuria and urgency.  ?Musculoskeletal:  Positive for myalgias. Negative for back pain and neck pain.  ?Skin:  Negative for color change, pallor, rash and wound.  ?Neurological:  Positive for syncope and light-headedness. Negative for dizziness, tremors, seizures, facial asymmetry, speech difficulty, weakness, numbness and headaches.  ?Psychiatric/Behavioral:  Positive for hallucinations and suicidal ideas. Negative for confusion.   ? ?Physical Exam ?Updated Vital Signs ?BP 135/70 (BP Location: Left Arm)   Pulse 77   Temp 98.1 ?F (36.7 ?C) (Oral)   Resp 18   SpO2 96%  ?Physical Exam ?Vitals and nursing note reviewed.  ?Constitutional:   ?   General: He is not in acute distress. ?   Appearance: He is not ill-appearing, toxic-appearing  or diaphoretic.  ?HENT:  ?   Head: Normocephalic.  ?Eyes:  ?   General: No scleral icterus.    ?   Right eye: No discharge.     ?   Left eye: No discharge.  ?Cardiovascular:  ?   Rate and Rhythm: Normal rate.  ?   Pulses:     ?     Radial pulses are 2+ on the right side and 2+ on the left side.  ?     Dorsalis pedis pulses are 2+ on the right  side and 2+ on the left side.  ?   Heart sounds: Normal heart sounds, S1 normal and S2 normal. No murmur heard. ?Pulmonary:  ?   Effort: Pulmonary effort is normal. No tachypnea, bradypnea or respiratory distress.  ?   Breath sounds: Normal breath sounds. No stridor.  ?Abdominal:  ?   General: Abdomen is flat. Bowel sounds are normal. There is no distension. There are no signs of injury.  ?   Palpations: Abdomen is soft. There is no mass or pulsatile mass.  ?   Tenderness: There is no abdominal tenderness. There is no guarding or rebound.  ?Musculoskeletal:  ?   Right lower leg: Normal.  ?   Left lower leg: Normal.  ?   Right ankle: No swelling, deformity, ecchymosis or lacerations. No tenderness. Normal range of motion.  ?   Left ankle: No swelling, deformity, ecchymosis or lacerations. No tenderness. Normal range of motion.  ?   Right foot: Normal range of motion and normal capillary refill. No swelling, deformity, laceration, tenderness, bony tenderness or crepitus. Normal pulse.  ?   Left foot: Normal range of motion and normal capillary refill. No swelling, deformity, laceration, tenderness, bony tenderness or crepitus. Normal pulse.  ?Feet:  ?   Right foot:  ?   Skin integrity: Callus and dry skin present. No ulcer, blister, skin breakdown, erythema, warmth or fissure.  ?   Toenail Condition: Right toenails are abnormally thick.  ?   Left foot:  ?   Skin integrity: Callus and dry skin present. No ulcer, blister, skin breakdown, erythema, warmth or fissure.  ?   Toenail Condition: Left toenails are abnormally thick.  ?Skin: ?   General: Skin is warm and dry.  ?Neurological:  ?   General: No focal deficit present.  ?   Mental Status: He is alert and oriented to person, place, and time.  ?   GCS: GCS eye subscore is 4. GCS verbal subscore is 5. GCS motor subscore is 6.  ?   Comments: No facial asymmetry or dysarthria.  Patient moves all limbs equally without difficulty.  Able to stand and ambulate without  difficulty.  ?Psychiatric:     ?   Attention and Perception: He is attentive. He perceives auditory hallucinations. He does not perceive visual hallucinations.     ?   Behavior: Behavior is cooperative.     ?   Thought Content: Thought content is not paranoid or delusional. Thought content includes homicidal and suicidal ideation. Thought content does not include homicidal or suicidal plan.  ? ? ?ED Results / Procedures / Treatments   ?Labs ?(all labs ordered are listed, but only abnormal results are displayed) ?Labs Reviewed  ?BASIC METABOLIC PANEL - Abnormal; Notable for the following components:  ?    Result Value  ? BUN 27 (*)   ? All other components within normal limits  ?CBC - Abnormal; Notable for the following components:  ?  HCT 38.0 (*)   ? All other components within normal limits  ?ACETAMINOPHEN LEVEL - Abnormal; Notable for the following components:  ? Acetaminophen (Tylenol), Serum <10 (*)   ? All other components within normal limits  ?SALICYLATE LEVEL - Abnormal; Notable for the following components:  ? Salicylate Lvl <0.2 (*)   ? All other components within normal limits  ?CBG MONITORING, ED - Abnormal; Notable for the following components:  ? Glucose-Capillary 107 (*)   ? All other components within normal limits  ?URINALYSIS, ROUTINE W REFLEX MICROSCOPIC  ?RAPID URINE DRUG SCREEN, HOSP PERFORMED  ?TROPONIN I (HIGH SENSITIVITY)  ? ? ?EKG ?EKG Interpretation ? ?Date/Time:  Tuesday May 14 2022 15:01:15 EDT ?Ventricular Rate:  73 ?PR Interval:  149 ?QRS Duration: 83 ?QT Interval:  371 ?QTC Calculation: 409 ?R Axis:   79 ?Text Interpretation: Sinus rhythm ST elev, probable normal early repol pattern Confirmed by Dene Gentry [phone]) on 05/14/2022 4:46:21 PM ? ?Radiology ?DG Foot Complete Left ? ?Result Date: 05/14/2022 ?CLINICAL DATA:  Foot pain, swelling EXAM: RIGHT FOOT COMPLETE - 3+ VIEW; LEFT FOOT - COMPLETE 3+ VIEW COMPARISON:  06/10/2017, 04/12/2021 FINDINGS: Left foot: Frontal, oblique, and  lateral views are obtained. No acute fracture, subluxation, or dislocation. Joint spaces are well preserved. Soft tissues are unremarkable. Right foot: Frontal, oblique, lateral views are obtained. No fracture, sublux

## 2022-05-14 NOTE — ED Triage Notes (Addendum)
Pt arrived via [facility] from off of [address]  ? ?C/o near syncope and foot pain.  ? ?Reports near syncope episode about 1 hr ago. Denies LOC.  ? ?Reports bilateral feet swelling and pain.  ? ?Pt reports he feels uneasy and requesting to go to [facility] across the street for out patient behavioral support.  ? ?Denies SI or HI to this nurse.  ? ?Per ems does not seem like he has been on his medications.  ? ?Hx: schizophrenia   ? ?VS: ?BP-130/80 ?P-86 ?98% RA ?Cbg-124 ?

## 2022-05-20 ENCOUNTER — Emergency Department (HOSPITAL_COMMUNITY)
Admission: EM | Admit: 2022-05-20 | Discharge: 2022-05-21 | Disposition: A | Discharge status: Home / Self Care | Payer: Medicaid Other | Attending: Emergency Medicine | Admitting: Emergency Medicine

## 2022-05-20 ENCOUNTER — Other Ambulatory Visit: Payer: Self-pay

## 2022-05-20 DIAGNOSIS — F1910 Other psychoactive substance abuse, uncomplicated: Secondary | ICD-10-CM | POA: Diagnosis not present

## 2022-05-20 DIAGNOSIS — R7309 Other abnormal glucose: Secondary | ICD-10-CM | POA: Insufficient documentation

## 2022-05-20 DIAGNOSIS — T50901A Poisoning by unspecified drugs, medicaments and biological substances, accidental (unintentional), initial encounter: Secondary | ICD-10-CM | POA: Diagnosis present

## 2022-05-20 LAB — CBC WITH DIFFERENTIAL/PLATELET
Abs Immature Granulocytes: 0.03 10*3/uL (ref 0.00–0.07)
Basophils Absolute: 0.1 10*3/uL (ref 0.0–0.1)
Basophils Relative: 1 %
Eosinophils Absolute: 0.1 10*3/uL (ref 0.0–0.5)
Eosinophils Relative: 1 %
HCT: 40.1 % (ref 39.0–52.0)
Hemoglobin: 13.4 g/dL (ref 13.0–17.0)
Immature Granulocytes: 0 %
Lymphocytes Relative: 19 %
Lymphs Abs: 2.1 10*3/uL (ref 0.7–4.0)
MCH: 28 pg (ref 26.0–34.0)
MCHC: 33.4 g/dL (ref 30.0–36.0)
MCV: 83.7 fL (ref 80.0–100.0)
Monocytes Absolute: 0.8 10*3/uL (ref 0.1–1.0)
Monocytes Relative: 8 %
Neutro Abs: 7.7 10*3/uL (ref 1.7–7.7)
Neutrophils Relative %: 71 %
Platelets: 299 10*3/uL (ref 150–400)
RBC: 4.79 MIL/uL (ref 4.22–5.81)
RDW: 13.6 % (ref 11.5–15.5)
WBC: 10.8 10*3/uL — ABNORMAL HIGH (ref 4.0–10.5)
nRBC: 0 % (ref 0.0–0.2)

## 2022-05-20 LAB — COMPREHENSIVE METABOLIC PANEL
ALT: 25 U/L (ref 0–44)
AST: 40 U/L (ref 15–41)
Albumin: 4.1 g/dL (ref 3.5–5.0)
Alkaline Phosphatase: 45 U/L (ref 38–126)
Anion gap: 9 (ref 5–15)
BUN: 19 mg/dL (ref 6–20)
CO2: 21 mmol/L — ABNORMAL LOW (ref 22–32)
Calcium: 9 mg/dL (ref 8.9–10.3)
Chloride: 104 mmol/L (ref 98–111)
Creatinine, Ser: 1.18 mg/dL (ref 0.61–1.24)
GFR, Estimated: >60 mL/min (ref >60)
Glucose, Bld: 213 mg/dL — ABNORMAL HIGH (ref 70–99)
Potassium: 3.6 mmol/L (ref 3.5–5.1)
Sodium: 134 mmol/L — ABNORMAL LOW (ref 135–145)
Total Bilirubin: 0.7 mg/dL (ref 0.3–1.2)
Total Protein: 6.8 g/dL (ref 6.5–8.1)

## 2022-05-20 LAB — ACETAMINOPHEN LEVEL: Acetaminophen (Tylenol), Serum: <10 ug/mL — ABNORMAL LOW (ref 10–30)

## 2022-05-20 LAB — SALICYLATE LEVEL: Salicylate Lvl: <7 mg/dL — ABNORMAL LOW (ref 7.0–30.0)

## 2022-05-20 LAB — ETHANOL: Alcohol, Ethyl (B): <10 mg/dL (ref <10)

## 2022-05-20 MED ORDER — ONDANSETRON HCL 4 MG/2ML IJ SOLN
4.0000 mg | Freq: Once | INTRAMUSCULAR | Status: AC
  Administered 2022-05-20: 4 mg via INTRAVENOUS
  Filled 2022-05-20: qty 2

## 2022-05-20 NOTE — ED Notes (Signed)
Pt sleeping but arousal to voice at this time - pt notified of need for urine sample - urinal at bedside

## 2022-05-20 NOTE — ED Notes (Signed)
Pt with 3-4 episodes of vomiting - PA aware and meds given.  Pt requesting something to drink at this time - pt given sprite at this time

## 2022-05-20 NOTE — ED Triage Notes (Signed)
Pt BIB EMS. Pt found unconscious behind dumpster tonight. Given 1 total of Narcan PTA. Admits to using several drugs tonight including heroin, percocet, and marijuana. Pt ambulatory to room and alert but lethargic at this time.

## 2022-05-20 NOTE — ED Provider Notes (Signed)
[facility] EMERGENCY DEPARTMENT Provider Note   CSN: [id number] Arrival date & time: 05/20/22  2204     History {Add pertinent medical, surgical, social history, OB history to HPI:1} Chief Complaint  Patient presents with   Drug Overdose    1 of narcan given PTA     Tyler White is a 26 y.o. male.  The history is provided by the patient and medical records.  Drug Overdose  26 year old male with history of schizophrenia, polysubstance abuse, presenting to the ED after drug overdose.  He was found near a dumpster behind a gas station by bystander who called EMS as he was unresponsive.  He was given total of 1 mg Narcan with good response.  Admits to using heroin, Percocet, and marijuana tonight.  When asked if he intentionally overdosed, patient stares at the wall and will not answer.  He is ambulatory in the ED upon arrival.  Home Medications Prior to Admission medications   Medication Sig Start Date End Date Taking? Authorizing Provider  paliperidone (INVEGA SUSTENNA) 234 MG/1.5ML SUSY injection Inject 234 mg into the muscle once for 1 dose. 04/16/22 05/03/22  Tharon Aquas, NP  gabapentin (NEURONTIN) 400 MG capsule Take 1 capsule (400 mg total) by mouth 3 (three) times daily. Patient not taking: Reported on 06/09/2021 04/30/21 06/10/21  Ethelene Hal, NP      Allergies    Patient has no known allergies.    Review of Systems   Review of Systems  Psychiatric/Behavioral:         OD  All other systems reviewed and are negative.  Physical Exam Updated Vital Signs BP (!) 146/92   Pulse 84   Temp 97.8 F (36.6 C) (Oral)   Resp 11   Ht '5\' 8"'$  (1.727 m)   Wt 54.4 kg   SpO2 98%   BMI 18.25 kg/m   Physical Exam Vitals and nursing note reviewed.  Constitutional:      Appearance: He is well-developed.     Comments: Disheveled appearing, clothing is dirty  HENT:     Head: Normocephalic and atraumatic.  Eyes:     Conjunctiva/sclera:  Conjunctivae normal.     Pupils: Pupils are equal, round, and reactive to light.  Cardiovascular:     Rate and Rhythm: Normal rate and regular rhythm.     Heart sounds: Normal heart sounds.  Pulmonary:     Effort: Pulmonary effort is normal.     Breath sounds: Normal breath sounds.  Abdominal:     General: Bowel sounds are normal.     Palpations: Abdomen is soft.  Musculoskeletal:        General: Normal range of motion.     Cervical back: Normal range of motion.  Skin:    General: Skin is warm and dry.  Neurological:     Mental Status: He is alert and oriented to person, place, and time.     Comments: Awake, alert, ambulatory to exam room with EMS  Psychiatric:     Comments: When asked about SI he stares blankly at the wall and will not answer    ED Results / Procedures / Treatments   Labs (all labs ordered are listed, but only abnormal results are displayed) Labs Reviewed  CBC WITH DIFFERENTIAL/PLATELET  COMPREHENSIVE METABOLIC PANEL  ETHANOL  RAPID URINE DRUG SCREEN, HOSP PERFORMED  SALICYLATE LEVEL  ACETAMINOPHEN LEVEL    EKG None  Radiology No results found.  Procedures Procedures  {Document cardiac monitor,  telemetry assessment procedure when appropriate:1}  Medications Ordered in ED Medications - No data to display  ED Course/ Medical Decision Making/ A&P                           Medical Decision Making Amount and/or Complexity of Data Reviewed Labs: ordered.   ***  {Document critical care time when appropriate:1} {Document review of labs and clinical decision tools ie heart score, Chads2Vasc2 etc:1}  {Document your independent review of radiology images, and any outside records:1} {Document your discussion with family members, caretakers, and with consultants:1} {Document social determinants of health affecting pt's care:1} {Document your decision making why or why not admission, treatments were needed:1} Final Clinical Impression(s) / ED  Diagnoses Final diagnoses:  None    Rx / DC Orders ED Discharge Orders     None

## 2022-05-21 ENCOUNTER — Encounter (HOSPITAL_COMMUNITY): Payer: Self-pay | Admitting: Emergency Medicine

## 2022-05-21 ENCOUNTER — Emergency Department (HOSPITAL_COMMUNITY)
Admission: EM | Admit: 2022-05-21 | Discharge: 2022-05-21 | Disposition: A | Discharge status: Home / Self Care | Payer: Medicaid Other | Source: Home / Self Care | Attending: Emergency Medicine | Admitting: Emergency Medicine

## 2022-05-21 DIAGNOSIS — F1910 Other psychoactive substance abuse, uncomplicated: Secondary | ICD-10-CM | POA: Insufficient documentation

## 2022-05-21 DIAGNOSIS — R7309 Other abnormal glucose: Secondary | ICD-10-CM | POA: Insufficient documentation

## 2022-05-21 LAB — CBG MONITORING, ED: Glucose-Capillary: 105 mg/dL — ABNORMAL HIGH (ref 70–99)

## 2022-05-21 MED ORDER — NALOXONE HCL 4 MG/0.1ML NA LIQD
1.0000 | Freq: Once | NASAL | Status: AC
  Administered 2022-05-21: 1 via NASAL
  Filled 2022-05-21: qty 4

## 2022-05-21 NOTE — ED Notes (Signed)
Reviewed discharge instructions with patient. Follow-up care and narcan kit reviewed. Patient verbalized understanding. Patient A&Ox4, VSS, and ambulatory with steady gait upon discharge.

## 2022-05-21 NOTE — ED Provider Notes (Addendum)
[facility] EMERGENCY DEPARTMENT Provider Note   CSN: [id number] Arrival date & time: 05/21/22  9371     History  Chief Complaint  Patient presents with   Dizzy    Duquan Gillooly is a 26 y.o. male.  Patient is a 26 year old male who presents with dizziness.  He was seen in this emergency department last night after a drug overdose.  He admitted to using heroin, Percocet and marijuana.  He responded to Narcan.  He was observed and discharged early this morning.  He said that as he was walking to the bus stop, he felt dizzy and lightheaded.  He feels like he needs a sandwich or something to eat.  He also like to sleep for a little while.  He denies ingesting any other substances.  No chest pain or shortness of breath.  No fevers or other recent illnesses.  No nausea vomiting or diarrhea.      Home Medications Prior to Admission medications   Medication Sig Start Date End Date Taking? Authorizing Provider  paliperidone (INVEGA SUSTENNA) 234 MG/1.5ML SUSY injection Inject 234 mg into the muscle once for 1 dose. 04/16/22 05/03/22  Tharon Aquas, NP  gabapentin (NEURONTIN) 400 MG capsule Take 1 capsule (400 mg total) by mouth 3 (three) times daily. Patient not taking: Reported on 06/09/2021 04/30/21 06/10/21  Ethelene Hal, NP      Allergies    Patient has no known allergies.    Review of Systems   Review of Systems  Constitutional:  Positive for fatigue. Negative for chills, diaphoresis and fever.  HENT:  Negative for congestion, rhinorrhea and sneezing.   Eyes: Negative.   Respiratory:  Negative for cough, chest tightness and shortness of breath.   Cardiovascular:  Negative for chest pain and leg swelling.  Gastrointestinal:  Negative for abdominal pain, blood in stool, diarrhea, nausea and vomiting.  Genitourinary:  Negative for difficulty urinating, flank pain, frequency and hematuria.  Musculoskeletal:  Negative for arthralgias and back pain.   Skin:  Negative for rash.  Neurological:  Positive for light-headedness. Negative for dizziness, speech difficulty, weakness, numbness and headaches.   Physical Exam Updated Vital Signs BP (!) 125/54 (BP Location: Right Arm)   Pulse 95   Temp 98.3 F (36.8 C) (Oral)   Resp 15   Ht '5\' 8"'$  (1.727 m)   Wt 54.4 kg   SpO2 98%   BMI 18.25 kg/m  Physical Exam Constitutional:      Appearance: He is well-developed.  HENT:     Head: Normocephalic and atraumatic.  Eyes:     Pupils: Pupils are equal, round, and reactive to light.     Comments: No nystagmus  Cardiovascular:     Rate and Rhythm: Normal rate and regular rhythm.     Heart sounds: Normal heart sounds.  Pulmonary:     Effort: Pulmonary effort is normal. No respiratory distress.     Breath sounds: Normal breath sounds. No wheezing or rales.  Chest:     Chest wall: No tenderness.  Abdominal:     General: Bowel sounds are normal.     Palpations: Abdomen is soft.     Tenderness: There is no abdominal tenderness. There is no guarding or rebound.  Musculoskeletal:        General: Normal range of motion.     Cervical back: Normal range of motion and neck supple.  Lymphadenopathy:     Cervical: No cervical adenopathy.  Skin:  General: Skin is warm and dry.     Findings: No rash.  Neurological:     General: No focal deficit present.     Mental Status: He is alert and oriented to person, place, and time.    ED Results / Procedures / Treatments   Labs (all labs ordered are listed, but only abnormal results are displayed) Labs Reviewed  CBG MONITORING, ED - Abnormal; Notable for the following components:      Result Value   Glucose-Capillary 105 (*)    All other components within normal limits    EKG None  Radiology No results found.  Procedures Procedures    Medications Ordered in ED Medications  naloxone (NARCAN) nasal spray 4 mg/0.1 mL (has no administration in time range)    ED Course/ Medical  Decision Making/ A&P                           Medical Decision Making Risk Prescription drug management.   Patient is a 26 year old who presents with some lightheadedness.  He had a recent drug overdose and was discharged early this morning.  I reviewed his labs.  He had CBC which was unremarkable.  His chemistry did show an elevated glucose but otherwise nonconcerning.  Repeat CBG this morning shows it has normalized.  He does not have any signs of ACS.  No vomiting or signs of dehydration.  He was monitored in the ED for period of time and given something to eat.  He slept for a while.  He says he is feeling better and was discharged in good condition.  At this point he does not meet criteria for inpatient treatment.  He was discharged home in good condition.  He was dispensed a Narcan unit.  He was given information about following up with [facility] for substance abuse treatment.  Return precautions were given.  Final Clinical Impression(s) / ED Diagnoses Final diagnoses:  Substance abuse [facility])    Rx / [place] Orders ED Discharge Orders     None         Malvin Johns, MD 05/21/22 4235    Malvin Johns, MD 05/21/22 [phone]

## 2022-05-21 NOTE — ED Notes (Signed)
Patient verbalizes understanding of discharge instructions. Opportunity for questioning and answers were provided. Armband removed by staff, pt discharged from ED pt ambulatory to lobby

## 2022-05-21 NOTE — Discharge Instructions (Signed)
I have attached resources if you would like help with substance abuse. Follow-up with your primary care doctor. Return here for new concerns.

## 2022-05-21 NOTE — ED Triage Notes (Signed)
Patient reports feeling dizzy while at the bus stop this morning . Ambulatory/respirations unlabored . Denies pain , seen here this evening and was discharged.

## 2022-05-21 NOTE — ED Notes (Signed)
Pt attempted to urinate. Was given water and and bag lunch per request

## 2022-05-23 ENCOUNTER — Encounter (HOSPITAL_COMMUNITY): Payer: Self-pay | Admitting: Emergency Medicine

## 2022-05-23 ENCOUNTER — Emergency Department (HOSPITAL_COMMUNITY)
Admission: EM | Admit: 2022-05-23 | Discharge: 2022-05-23 | Disposition: A | Discharge status: Home / Self Care | Payer: Medicaid Other | Attending: Emergency Medicine | Admitting: Emergency Medicine

## 2022-05-23 DIAGNOSIS — Z765 Malingerer [conscious simulation]: Secondary | ICD-10-CM | POA: Insufficient documentation

## 2022-05-23 DIAGNOSIS — R519 Headache, unspecified: Secondary | ICD-10-CM | POA: Diagnosis present

## 2022-05-23 MED ORDER — ACETAMINOPHEN 325 MG PO TABS
650.0000 mg | ORAL_TABLET | Freq: Four times a day (QID) | ORAL | Status: DC | PRN
  Administered 2022-05-23: 650 mg via ORAL
  Filled 2022-05-23: qty 2

## 2022-05-23 NOTE — ED Notes (Signed)
Pt given a drink and crackers for fluid/PO challenge

## 2022-05-23 NOTE — ED Provider Notes (Signed)
[facility] EMERGENCY DEPARTMENT Provider Note   CSN: [id number] Arrival date & time: 05/23/22  0207     History  Chief Complaint  Patient presents with   Headache    Dedric Ethington is a 26 y.o. male.  HPI    This is a 26 year old male with a history of homelessness, food instability, schizoaffective disorder who presents with reported headache.  Patient states that he has had a headache today.  He reports that he has not had much sleep in the last several days.  He initially called EMS and per EMS report he was "requesting a blanket."  He was recently seen and evaluated for potential overdose.  At that time he endorsed heroin use.  He subsequently returned to the emergency room requesting food and a place to sleep.  He states he currently does not have anywhere to go and is homeless.  Patient denies any weakness, numbness, tingling.  He denies drug use tonight. Home Medications Prior to Admission medications   Medication Sig Start Date End Date Taking? Authorizing Provider  paliperidone (INVEGA SUSTENNA) 234 MG/1.5ML SUSY injection Inject 234 mg into the muscle once for 1 dose. 04/16/22 05/03/22  Tharon Aquas, NP  gabapentin (NEURONTIN) 400 MG capsule Take 1 capsule (400 mg total) by mouth 3 (three) times daily. Patient not taking: Reported on 06/09/2021 04/30/21 06/10/21  Ethelene Hal, NP      Allergies    Patient has no known allergies.    Review of Systems   Review of Systems  Neurological:  Positive for headaches.  All other systems reviewed and are negative.  Physical Exam Updated Vital Signs BP 127/73   Pulse (!) 59   Temp 97.7 F (36.5 C) (Oral)   Resp 16   Ht 1.727 m ('5\' 8"'$ )   Wt 55 kg   SpO2 100%   BMI 18.44 kg/m  Physical Exam Vitals and nursing note reviewed.  Constitutional:      Appearance: He is well-developed.     Comments: Disheveled appearing  HENT:     Head: Normocephalic and atraumatic.  Eyes:     Pupils: Pupils  are equal, round, and reactive to light.     Comments: Pupils 4 mm reactive bilateral   Cardiovascular:     Rate and Rhythm: Normal rate and regular rhythm.  Pulmonary:     Effort: Pulmonary effort is normal. No respiratory distress.  Abdominal:     Palpations: Abdomen is soft.     Tenderness: There is no rebound.  Musculoskeletal:     Cervical back: Neck supple.  Lymphadenopathy:     Cervical: No cervical adenopathy.  Skin:    General: Skin is warm and dry.  Neurological:     Mental Status: He is alert and oriented to person, place, and time.    ED Results / Procedures / Treatments   Labs (all labs ordered are listed, but only abnormal results are displayed) Labs Reviewed - No data to display  EKG None  Radiology No results found.  Procedures Procedures    Medications Ordered in ED Medications  acetaminophen (TYLENOL) tablet 650 mg (650 mg Oral Given 05/23/22 0251)    ED Course/ Medical Decision Making/ A&P                           Medical Decision Making Risk OTC drugs.   This patient presents to the ED for concern of headache, this involves  an extensive number of treatment options, and is a complaint that carries with it a high risk of complications and morbidity.  I considered the following differential and admission for this acute, potentially life threatening condition.  The differential diagnosis includes tension headache, malingering  MDM:    This is a 26 year old male who presents with reported headache.  Initially requested a blanket from EMS.  He is known to be homeless.  He has been in the emergency room greater than 10 times in the last 6 weeks.  This includes 2 times on Sunday night and Monday morning.  Reports headache but then falls back asleep.  He appears to be at his baseline.  I highly suspect malingering as patient has a poor social situation.  Patient was allowed to rest and given food.  He was given Tylenol for his headache.  Do not feel he  requires any further work-up.  Doubt acute medical emergency.  (Labs, imaging, consults)  Labs: I Ordered, and personally interpreted labs.  The pertinent results include: None  Imaging Studies ordered: I ordered imaging studies including none I independently visualized and interpreted imaging. I agree with the radiologist interpretation  Additional history obtained from review.  External records from outside source obtained and reviewed including evaluations  Cardiac Monitoring: The patient was maintained on a cardiac monitor.  I personally viewed and interpreted the cardiac monitored which showed an underlying rhythm of: Sinus rhythm  Reevaluation: After the interventions noted above, I reevaluated the patient and found that they have :stayed the same  Social Determinants of Health: Homeless, poor social support  Disposition: Discharge  Co morbidities that complicate the patient evaluation  Past Medical History:  Diagnosis Date   Hernia, inguinal, right    Inguinal hernia    right   Psychiatric illness    Schizophrenia ([place])      Medicines Meds ordered this encounter  Medications   acetaminophen (TYLENOL) tablet 650 mg    I have reviewed the patients home medicines and have made adjustments as needed  Problem List / ED Course: Problem List Items Addressed This Visit       Other   Malingering - Primary   Other Visit Diagnoses     Nonintractable headache, unspecified chronicity pattern, unspecified headache type       Relevant Medications   acetaminophen (TYLENOL) tablet 650 mg                   Final Clinical Impression(s) / ED Diagnoses Final diagnoses:  Malingering  Nonintractable headache, unspecified chronicity pattern, unspecified headache type    Rx / DC Orders ED Discharge Orders     None         Merryl Hacker, MD 05/23/22 [phone]

## 2022-05-23 NOTE — ED Notes (Signed)
E-signature pad unavailable at time of pt discharge. This RN discussed discharge materials with pt and answered all pt questions. Pt stated understanding of discharge material. ? ?

## 2022-05-23 NOTE — ED Triage Notes (Addendum)
[place] [facility] [place]  Patient initially told EMS he needed transport because he was cold, and then told them that he had a headache.  Patient ambulatory with a steady gait into triage with a blanket wrapped around him. During triage assessment, patient falls asleep in between questions. Patient states, "I'm just tired."

## 2022-05-29 ENCOUNTER — Telehealth (HOSPITAL_COMMUNITY): Payer: Self-pay | Admitting: Pediatrics

## 2022-05-29 NOTE — BH Assessment (Signed)
Care Management - [facility] Follow Up Discharges  ° °Writer attempted to make contact with patient today and was unsuccessful.  Writer left a HIPPA compliant voice message.  ° °Per chart review, patient was provided with outpatient resources. ° °

## 2022-05-30 ENCOUNTER — Ambulatory Visit (HOSPITAL_COMMUNITY): Payer: Medicaid Other

## 2022-05-30 ENCOUNTER — Other Ambulatory Visit: Payer: Self-pay

## 2022-05-30 ENCOUNTER — Ambulatory Visit (HOSPITAL_COMMUNITY)
Admission: EM | Admit: 2022-05-30 | Discharge: 2022-05-30 | Disposition: A | Discharge status: Home / Self Care | Payer: Medicaid Other | Attending: Psychiatry | Admitting: Psychiatry

## 2022-05-30 DIAGNOSIS — F259 Schizoaffective disorder, unspecified: Secondary | ICD-10-CM

## 2022-05-30 DIAGNOSIS — Z91148 Patient's other noncompliance with medication regimen for other reason: Secondary | ICD-10-CM

## 2022-05-30 DIAGNOSIS — Z20822 Contact with and (suspected) exposure to covid-19: Secondary | ICD-10-CM | POA: Insufficient documentation

## 2022-05-30 DIAGNOSIS — R45851 Suicidal ideations: Secondary | ICD-10-CM

## 2022-05-30 LAB — COMPREHENSIVE METABOLIC PANEL
ALT: 25 U/L (ref 0–44)
AST: 36 U/L (ref 15–41)
Albumin: 4.1 g/dL (ref 3.5–5.0)
Alkaline Phosphatase: 46 U/L (ref 38–126)
Anion gap: 7 (ref 5–15)
BUN: 13 mg/dL (ref 6–20)
CO2: 26 mmol/L (ref 22–32)
Calcium: 9.4 mg/dL (ref 8.9–10.3)
Chloride: 106 mmol/L (ref 98–111)
Creatinine, Ser: 1 mg/dL (ref 0.61–1.24)
GFR, Estimated: >60 mL/min (ref >60)
Glucose, Bld: 98 mg/dL (ref 70–99)
Potassium: 3.6 mmol/L (ref 3.5–5.1)
Sodium: 139 mmol/L (ref 135–145)
Total Bilirubin: 0.7 mg/dL (ref 0.3–1.2)
Total Protein: 7 g/dL (ref 6.5–8.1)

## 2022-05-30 LAB — CBC WITH DIFFERENTIAL/PLATELET
Abs Immature Granulocytes: 0.01 10*3/uL (ref 0.00–0.07)
Basophils Absolute: 0.1 10*3/uL (ref 0.0–0.1)
Basophils Relative: 1 %
Eosinophils Absolute: 0.2 10*3/uL (ref 0.0–0.5)
Eosinophils Relative: 2 %
HCT: 40.9 % (ref 39.0–52.0)
Hemoglobin: 13.7 g/dL (ref 13.0–17.0)
Immature Granulocytes: 0 %
Lymphocytes Relative: 25 %
Lymphs Abs: 1.9 10*3/uL (ref 0.7–4.0)
MCH: 28 pg (ref 26.0–34.0)
MCHC: 33.5 g/dL (ref 30.0–36.0)
MCV: 83.5 fL (ref 80.0–100.0)
Monocytes Absolute: 0.5 10*3/uL (ref 0.1–1.0)
Monocytes Relative: 6 %
Neutro Abs: 5.2 10*3/uL (ref 1.7–7.7)
Neutrophils Relative %: 66 %
Platelets: 313 10*3/uL (ref 150–400)
RBC: 4.9 MIL/uL (ref 4.22–5.81)
RDW: 13.7 % (ref 11.5–15.5)
WBC: 7.8 10*3/uL (ref 4.0–10.5)
nRBC: 0 % (ref 0.0–0.2)

## 2022-05-30 LAB — POCT URINE DRUG SCREEN - MANUAL ENTRY (I-SCREEN)
POC Amphetamine UR: POSITIVE — AB
POC Buprenorphine (BUP): NOT DETECTED
POC Cocaine UR: POSITIVE — AB
POC Marijuana UR: POSITIVE — AB
POC Methadone UR: NOT DETECTED
POC Methamphetamine UR: POSITIVE — AB
POC Morphine: NOT DETECTED
POC Oxazepam (BZO): NOT DETECTED
POC Oxycodone UR: NOT DETECTED
POC Secobarbital (BAR): NOT DETECTED

## 2022-05-30 LAB — LIPID PANEL
Cholesterol: 188 mg/dL (ref 0–200)
HDL: 88 mg/dL (ref >40)
LDL Cholesterol: 90 mg/dL (ref 0–99)
Total CHOL/HDL Ratio: 2.1 RATIO
Triglycerides: 48 mg/dL (ref <150)
VLDL: 10 mg/dL (ref 0–40)

## 2022-05-30 LAB — TSH: TSH: 0.753 u[IU]/mL (ref 0.350–4.500)

## 2022-05-30 LAB — HEMOGLOBIN A1C
Hgb A1c MFr Bld: 5.2 % (ref 4.8–5.6)
Mean Plasma Glucose: 102.54 mg/dL

## 2022-05-30 LAB — RESP PANEL BY RT-PCR (FLU A&B, COVID) ARPGX2
Influenza A by PCR: NEGATIVE
Influenza B by PCR: NEGATIVE
SARS Coronavirus 2 by RT PCR: NEGATIVE

## 2022-05-30 LAB — POC SARS CORONAVIRUS 2 AG: SARSCOV2ONAVIRUS 2 AG: NEGATIVE

## 2022-05-30 MED ORDER — ALUM & MAG HYDROXIDE-SIMETH 200-200-20 MG/5ML PO SUSP: 30.0000 mL | ORAL | Status: DC | PRN

## 2022-05-30 MED ORDER — TRAZODONE HCL 50 MG PO TABS: 50.0000 mg | ORAL_TABLET | Freq: Every evening | ORAL | Status: DC | PRN

## 2022-05-30 MED ORDER — ACETAMINOPHEN 325 MG PO TABS: 650.0000 mg | ORAL_TABLET | Freq: Four times a day (QID) | ORAL | Status: DC | PRN

## 2022-05-30 MED ORDER — MAGNESIUM HYDROXIDE 400 MG/5ML PO SUSP: 30.0000 mL | Freq: Every day | ORAL | Status: DC | PRN

## 2022-05-30 NOTE — ED Notes (Signed)
Breakfast provided.

## 2022-05-30 NOTE — ED Provider Notes (Signed)
[facility] Continuous Assessment Admission H&P  Date: 05/30/22 Patient Name: Tyler White MRN: [id number] Chief Complaint:  Chief Complaint  Patient presents with   Suicidal      Diagnoses:  Final diagnoses:  Suicidal ideation  Hx of medication noncompliance  Schizoaffective disorder, unspecified type [facility])    HPI: Tyler White,  26 y.o male,  with a history of schizoaffective disorder,, presented to [facility] complaining of suicidal thoughts to OD on street drugs.  Per the patient he needs someone to talk to.  Patient reported he last used marijuana today, last drank alcohol yesterday.  Observation of patient, he is alert and oriented x4 speech is clear, mood is depressed and anxious affect congruent with mood.  Reports he has suicidal thoughts with plan to OD on street drugs.  Patient denies HI and paranoia.  Patient report auditory hallucination by saying he hears voices of people talking.  Per the patient he has not taken any of his scheduled psych medication, patient stated that they took him off his p.o mediications.  Patient denies access to guns Or any other form of weapon.  Recommend overnight observation  PHQ 2-9:  [place] [facility] from 03/11/2021 in [place]  Thoughts that you would be better off dead, or of hurting yourself in some way Several days  PHQ-9 Total Score 17       [place] [facility] from 05/30/2022 in [facility] from 05/23/2022 in [place] [facility] from 05/21/2022 in [place] High Risk No Risk No Risk        Total Time spent with patient: 20 minutes  Musculoskeletal  Strength & Muscle Tone: within normal limits Gait & Station: normal Patient leans: N/A  Psychiatric Specialty Exam  Presentation General Appearance: Casual  Eye Contact:Fair  Speech:Clear and Coherent  Speech  Volume:Normal  Handedness:Ambidextrous   Mood and Affect  Mood:Anxious; Depressed  Affect:Appropriate   Thought Process  Thought Processes:Coherent  Descriptions of Associations:Circumstantial  Orientation:Full (Time, Place and Person)  Thought Content:WDL  Diagnosis of Schizophrenia or Schizoaffective disorder in past: Yes  Duration of Psychotic Symptoms: Greater than six months  Hallucinations:Hallucinations: Auditory Description of Auditory Hallucinations: hear voices  Ideas of Reference:None  Suicidal Thoughts:Suicidal Thoughts: Yes, Passive SI Passive Intent and/or Plan: Without Intent  Homicidal Thoughts:Homicidal Thoughts: No   Sensorium  Memory:Immediate Fair  Judgment:Poor  Insight:Fair   Executive Functions  Concentration:Poor  Attention Span:Fair  [place]   Psychomotor Activity  Psychomotor Activity:Psychomotor Activity: Normal   Assets  Assets:Desire for Improvement   Sleep  Sleep:Sleep: Fair   Nutritional Assessment (For OBS and FBC admissions only) Has the patient had a weight loss or gain of 10 pounds or more in the last 3 months?: No Has the patient had a decrease in food intake/or appetite?: No Does the patient have dental problems?: No Does the patient have eating habits or behaviors that may be indicators of an eating disorder including binging or inducing vomiting?: No Has the patient recently lost weight without trying?: 0 Has the patient been eating poorly because of a decreased appetite?: 0 Malnutrition Screening Tool Score: 0    Physical Exam HENT:     Head: Normocephalic.     Nose: Nose normal.  Cardiovascular:     Rate and Rhythm: Normal rate.  Pulmonary:     Effort: Pulmonary effort is  normal.  Musculoskeletal:        General: Normal range of motion.     Cervical back: Normal range of motion.  Skin:    General: Skin is warm.  Neurological:     General: No  focal deficit present.     Mental Status: He is alert.  Psychiatric:        Mood and Affect: Mood normal.        Behavior: Behavior normal.        Thought Content: Thought content normal.        Judgment: Judgment normal.   Review of Systems  Constitutional: Negative.   HENT: Negative.    Eyes: Negative.   Respiratory: Negative.    Cardiovascular: Negative.   Gastrointestinal: Negative.   Genitourinary: Negative.   Musculoskeletal: Negative.   Skin: Negative.   Neurological: Negative.   Endo/Heme/Allergies: Negative.   Psychiatric/Behavioral:  Positive for substance abuse and suicidal ideas. The patient is nervous/anxious.    Blood pressure 132/82, pulse 79, temperature 98.2 F (36.8 C), temperature source Oral, resp. rate 18, SpO2 99 %. There is no height or weight on file to calculate BMI.  Past Psychiatric History: schizoaffective dx, medication noncomplance   Is the patient at risk to self? Yes  Has the patient been a risk to self in the past 6 months? Yes .    Has the patient been a risk to self within the distant past? Yes   Is the patient a risk to others? No   Has the patient been a risk to others in the past 6 months? No   Has the patient been a risk to others within the distant past? No   Past Medical History:  Past Medical History:  Diagnosis Date   Hernia, inguinal, right    Inguinal hernia    right   Psychiatric illness    Schizophrenia ([place])    No past surgical history on file.  Family History:  Family History  Problem Relation Age of Onset   Psychiatric Illness Mother    Hypertension Sister     Social History:  Social History   Socioeconomic History   Marital status: Single    Spouse name: Not on file   Number of children: Not on file   Years of education: 10   Highest education level: Not on file  Occupational History   Not on file  Tobacco Use   Smoking status: Every Day    Packs/day: 0.50    Years: 5.00    Pack years: 2.50    Types:  Cigarettes   Smokeless tobacco: Never  Vaping Use   Vaping Use: Never used  Substance and Sexual Activity   Alcohol use: Not Currently   Drug use: Not Currently    Types: Marijuana, Cocaine    Comment: denies currently   Sexual activity: Yes    Birth control/protection: None  Other Topics Concern   Not on file  Social History Narrative   ** Merged History Encounter **       ** Merged History Encounter **       Social Determinants of Health   Financial Resource Strain: Not on file  Food Insecurity: Not on file  Transportation Needs: Not on file  Physical Activity: Not on file  Stress: Not on file  Social Connections: Not on file  Intimate Partner Violence: Not on file    SDOH:  SDOH Screenings   Alcohol Screen: Low Risk    Last Alcohol  Screening Score (AUDIT): 0  Depression (PHQ2-9): Not on file  Financial Resource Strain: Not on file  Food Insecurity: Not on file  Housing: Not on file  Physical Activity: Not on file  Social Connections: Not on file  Stress: Not on file  Tobacco Use: High Risk   Smoking Tobacco Use: Every Day   Smokeless Tobacco Use: Never   Passive Exposure: Not on file  Transportation Needs: Not on file    Last Labs:  Admission on 05/30/2022  Component Date Value Ref Range Status   SARS Coronavirus 2 by RT PCR 05/30/2022 NEGATIVE  NEGATIVE Final   Comment: (NOTE) SARS-CoV-2 target nucleic acids are NOT DETECTED.  The SARS-CoV-2 RNA is generally detectable in upper respiratory specimens during the acute phase of infection. The lowest concentration of SARS-CoV-2 viral copies this assay can detect is 138 copies/mL. A negative result does not preclude SARS-Cov-2 infection and should not be used as the sole basis for treatment or other patient management decisions. A negative result may occur with  improper specimen collection/handling, submission of specimen other than nasopharyngeal swab, presence of viral mutation(s) within the areas  targeted by this assay, and inadequate number of viral copies(<138 copies/mL). A negative result must be combined with clinical observations, patient history, and epidemiological information. The expected result is Negative.  Fact Sheet for Patients:  EntrepreneurPulse.com.au  Fact Sheet for Healthcare Providers:  IncredibleEmployment.be  This test is no                          t yet approved or cleared by the [place] FDA and  has been authorized for detection and/or diagnosis of SARS-CoV-2 by FDA under an Emergency Use Authorization (EUA). This EUA will remain  in effect (meaning this test can be used) for the duration of the COVID-19 declaration under Section 564(b)(1) of the Act, 21 U.S.C.section 360bbb-3(b)(1), unless the authorization is terminated  or revoked sooner.       Influenza A by PCR 05/30/2022 NEGATIVE  NEGATIVE Final   Influenza B by PCR 05/30/2022 NEGATIVE  NEGATIVE Final   Comment: (NOTE) The Xpert Xpress SARS-CoV-2/FLU/RSV plus assay is intended as an aid in the diagnosis of influenza from Nasopharyngeal swab specimens and should not be used as a sole basis for treatment. Nasal washings and aspirates are unacceptable for Xpert Xpress SARS-CoV-2/FLU/RSV testing.  Fact Sheet for Patients: EntrepreneurPulse.com.au  Fact Sheet for Healthcare Providers: IncredibleEmployment.be  This test is not yet approved or cleared by the [place] FDA and has been authorized for detection and/or diagnosis of SARS-CoV-2 by FDA under an Emergency Use Authorization (EUA). This EUA will remain in effect (meaning this test can be used) for the duration of the COVID-19 declaration under Section 564(b)(1) of the Act, 21 U.S.C. section 360bbb-3(b)(1), unless the authorization is terminated or revoked.  Performed at [facility], [place] [address], [place], [place] [address]    WBC 05/30/2022  7.8  4.0 - 10.5 K/uL Final   RBC 05/30/2022 4.90  4.22 - 5.81 MIL/uL Final   Hemoglobin 05/30/2022 13.7  13.0 - 17.0 g/dL Final   HCT 05/30/2022 40.9  39.0 - 52.0 % Final   MCV 05/30/2022 83.5  80.0 - 100.0 fL Final   MCH 05/30/2022 28.0  26.0 - 34.0 pg Final   MCHC 05/30/2022 33.5  30.0 - 36.0 g/dL Final   RDW 05/30/2022 13.7  11.5 - 15.5 % Final   Platelets 05/30/2022 313  150 - 400 K/uL Final   nRBC 05/30/2022 0.0  0.0 - 0.2 % Final   Neutrophils Relative % 05/30/2022 66  % Final   Neutro Abs 05/30/2022 5.2  1.7 - 7.7 K/uL Final   Lymphocytes Relative 05/30/2022 25  % Final   Lymphs Abs 05/30/2022 1.9  0.7 - 4.0 K/uL Final   Monocytes Relative 05/30/2022 6  % Final   Monocytes Absolute 05/30/2022 0.5  0.1 - 1.0 K/uL Final   Eosinophils Relative 05/30/2022 2  % Final   Eosinophils Absolute 05/30/2022 0.2  0.0 - 0.5 K/uL Final   Basophils Relative 05/30/2022 1  % Final   Basophils Absolute 05/30/2022 0.1  0.0 - 0.1 K/uL Final   Immature Granulocytes 05/30/2022 0  % Final   Abs Immature Granulocytes 05/30/2022 0.01  0.00 - 0.07 K/uL Final   Performed at [facility], [place] [address], [place], [place] [address]   Sodium 05/30/2022 139  135 - 145 mmol/L Final   Potassium 05/30/2022 3.6  3.5 - 5.1 mmol/L Final   Chloride 05/30/2022 106  98 - 111 mmol/L Final   CO2 05/30/2022 26  22 - 32 mmol/L Final   Glucose, Bld 05/30/2022 98  70 - 99 mg/dL Final   Glucose reference range applies only to samples taken after fasting for at least 8 hours.   BUN 05/30/2022 13  6 - 20 mg/dL Final   Creatinine, Ser 05/30/2022 1.00  0.61 - 1.24 mg/dL Final   Calcium 05/30/2022 9.4  8.9 - 10.3 mg/dL Final   Total Protein 05/30/2022 7.0  6.5 - 8.1 g/dL Final   Albumin 05/30/2022 4.1  3.5 - 5.0 g/dL Final   AST 05/30/2022 36  15 - 41 U/L Final   ALT 05/30/2022 25  0 - 44 U/L Final   Alkaline Phosphatase 05/30/2022 46  38 - 126 U/L Final   Total Bilirubin 05/30/2022 0.7  0.3 - 1.2 mg/dL Final   GFR,  Estimated 05/30/2022 >60  >60 mL/min Final   Comment: (NOTE) Calculated using the CKD-EPI Creatinine Equation (2021)    Anion gap 05/30/2022 7  5 - 15 Final   Performed at [place] [address], [place], [place] [address]   Hgb A1c MFr Bld 05/30/2022 5.2  4.8 - 5.6 % Final   Comment: (NOTE) Pre diabetes:          5.7%-6.4%  Diabetes:              >6.4%  Glycemic control for   <7.0% adults with diabetes    Mean Plasma Glucose 05/30/2022 102.54  mg/dL Final   Performed at [facility], [place] [address], [place], [place] [address]   POC Amphetamine UR 05/30/2022 Positive (A)  NONE DETECTED (Cut Off Level 1000 ng/mL) Final   POC Secobarbital (BAR) 05/30/2022 None Detected  NONE DETECTED (Cut Off Level 300 ng/mL) Final   POC Buprenorphine (BUP) 05/30/2022 None Detected  NONE DETECTED (Cut Off Level 10 ng/mL) Final   POC Oxazepam (BZO) 05/30/2022 None Detected  NONE DETECTED (Cut Off Level 300 ng/mL) Final   POC Cocaine UR 05/30/2022 Positive (A)  NONE DETECTED (Cut Off Level 300 ng/mL) Final   POC Methamphetamine UR 05/30/2022 Positive (A)  NONE DETECTED (Cut Off Level 1000 ng/mL) Final   POC Morphine 05/30/2022 None Detected  NONE DETECTED (Cut Off Level 300 ng/mL) Final   POC Methadone UR 05/30/2022 None Detected  NONE DETECTED (Cut Off Level 300 ng/mL)  Final   POC Oxycodone UR 05/30/2022 None Detected  NONE DETECTED (Cut Off Level 100 ng/mL) Final   POC Marijuana UR 05/30/2022 Positive (A)  NONE DETECTED (Cut Off Level 50 ng/mL) Final   SARSCOV2ONAVIRUS 2 AG 05/30/2022 NEGATIVE  NEGATIVE Final   Comment: (NOTE) SARS-CoV-2 antigen NOT DETECTED.   Negative results are presumptive.  Negative results do not preclude SARS-CoV-2 infection and should not be used as the sole basis for treatment or other patient management decisions, including infection  control decisions, particularly in the presence of clinical signs and  symptoms consistent with COVID-19, or in those who  have been in contact with the virus.  Negative results must be combined with clinical observations, patient history, and epidemiological information. The expected result is Negative.  Fact Sheet for Patients: HandmadeRecipes.com.cy  Fact Sheet for Healthcare Providers: FuneralLife.at  This test is not yet approved or cleared by the [place] FDA and  has been authorized for detection and/or diagnosis of SARS-CoV-2 by FDA under an Emergency Use Authorization (EUA).  This EUA will remain in effect (meaning this test can be used) for the duration of  the COV                          ID-19 declaration under Section 564(b)(1) of the Act, 21 U.S.C. section 360bbb-3(b)(1), unless the authorization is terminated or revoked sooner.    Admission on 05/21/2022, Discharged on 05/21/2022  Component Date Value Ref Range Status   Glucose-Capillary 05/21/2022 105 (H)  70 - 99 mg/dL Final   Glucose reference range applies only to samples taken after fasting for at least 8 hours.  Admission on 05/20/2022, Discharged on 05/21/2022  Component Date Value Ref Range Status   WBC 05/20/2022 10.8 (H)  4.0 - 10.5 K/uL Final   RBC 05/20/2022 4.79  4.22 - 5.81 MIL/uL Final   Hemoglobin 05/20/2022 13.4  13.0 - 17.0 g/dL Final   HCT 05/20/2022 40.1  39.0 - 52.0 % Final   MCV 05/20/2022 83.7  80.0 - 100.0 fL Final   MCH 05/20/2022 28.0  26.0 - 34.0 pg Final   MCHC 05/20/2022 33.4  30.0 - 36.0 g/dL Final   RDW 05/20/2022 13.6  11.5 - 15.5 % Final   Platelets 05/20/2022 299  150 - 400 K/uL Final   nRBC 05/20/2022 0.0  0.0 - 0.2 % Final   Neutrophils Relative % 05/20/2022 71  % Final   Neutro Abs 05/20/2022 7.7  1.7 - 7.7 K/uL Final   Lymphocytes Relative 05/20/2022 19  % Final   Lymphs Abs 05/20/2022 2.1  0.7 - 4.0 K/uL Final   Monocytes Relative 05/20/2022 8  % Final   Monocytes Absolute 05/20/2022 0.8  0.1 - 1.0 K/uL Final   Eosinophils Relative 05/20/2022  1  % Final   Eosinophils Absolute 05/20/2022 0.1  0.0 - 0.5 K/uL Final   Basophils Relative 05/20/2022 1  % Final   Basophils Absolute 05/20/2022 0.1  0.0 - 0.1 K/uL Final   Immature Granulocytes 05/20/2022 0  % Final   Abs Immature Granulocytes 05/20/2022 0.03  0.00 - 0.07 K/uL Final   Performed at [facility], [place] [address], [place], [place] [address]   Sodium 05/20/2022 134 (L)  135 - 145 mmol/L Final   Potassium 05/20/2022 3.6  3.5 - 5.1 mmol/L Final   Chloride 05/20/2022 104  98 - 111 mmol/L Final   CO2 05/20/2022 21 (L)  22 -  32 mmol/L Final   Glucose, Bld 05/20/2022 213 (H)  70 - 99 mg/dL Final   Glucose reference range applies only to samples taken after fasting for at least 8 hours.   BUN 05/20/2022 19  6 - 20 mg/dL Final   Creatinine, Ser 05/20/2022 1.18  0.61 - 1.24 mg/dL Final   Calcium 05/20/2022 9.0  8.9 - 10.3 mg/dL Final   Total Protein 05/20/2022 6.8  6.5 - 8.1 g/dL Final   Albumin 05/20/2022 4.1  3.5 - 5.0 g/dL Final   AST 05/20/2022 40  15 - 41 U/L Final   ALT 05/20/2022 25  0 - 44 U/L Final   Alkaline Phosphatase 05/20/2022 45  38 - 126 U/L Final   Total Bilirubin 05/20/2022 0.7  0.3 - 1.2 mg/dL Final   GFR, Estimated 05/20/2022 >60  >60 mL/min Final   Comment: (NOTE) Calculated using the CKD-EPI Creatinine Equation (2021)    Anion gap 05/20/2022 9  5 - 15 Final   Performed at [place] [address] [place], [place] [address]   Alcohol, Ethyl (B) 05/20/2022 <10  <10 mg/dL Final   Comment: (NOTE) Lowest detectable limit for serum alcohol is 10 mg/dL.  For medical purposes only. Performed at [facility], [place] [address] [place], [place] [address]    Salicylate Lvl 26/37/8588 <7.0 (L)  7.0 - 30.0 mg/dL Final   Performed at [place] [address], [place], [place] [address]   Acetaminophen (Tylenol), Serum 05/20/2022 <10 (L)  10 - 30 ug/mL Final   Comment: (NOTE) Therapeutic concentrations vary significantly. A range of  10-30 ug/mL  may be an effective concentration for many patients. However, some  are best treated at concentrations outside of this range. Acetaminophen concentrations >150 ug/mL at 4 hours after ingestion  and >50 ug/mL at 12 hours after ingestion are often associated with  toxic reactions.  Performed at [facility], [place] [address], [place], [place] [address]   Admission on 05/14/2022, Discharged on 05/14/2022  Component Date Value Ref Range Status   Sodium 05/14/2022 139  135 - 145 mmol/L Final   Potassium 05/14/2022 3.8  3.5 - 5.1 mmol/L Final   Chloride 05/14/2022 107  98 - 111 mmol/L Final   CO2 05/14/2022 23  22 - 32 mmol/L Final   Glucose, Bld 05/14/2022 76  70 - 99 mg/dL Final   Glucose reference range applies only to samples taken after fasting for at least 8 hours.   BUN 05/14/2022 27 (H)  6 - 20 mg/dL Final   Creatinine, Ser 05/14/2022 0.99  0.61 - 1.24 mg/dL Final   Calcium 05/14/2022 9.0  8.9 - 10.3 mg/dL Final   GFR, Estimated 05/14/2022 >60  >60 mL/min Final   Comment: (NOTE) Calculated using the CKD-EPI Creatinine Equation (2021)    Anion gap 05/14/2022 9  5 - 15 Final   Performed at [facility], [place] [address] [place], [place] [address]   WBC 05/14/2022 7.7  4.0 - 10.5 K/uL Final   RBC 05/14/2022 4.50  4.22 - 5.81 MIL/uL Final   Hemoglobin 05/14/2022 13.0  13.0 - 17.0 g/dL Final   HCT 05/14/2022 38.0 (L)  39.0 - 52.0 % Final   MCV 05/14/2022 84.4  80.0 - 100.0 fL Final   MCH 05/14/2022 28.9  26.0 - 34.0 pg Final   MCHC 05/14/2022 34.2  30.0 - 36.0 g/dL Final   RDW 05/14/2022 13.8  11.5 -  15.5 % Final   Platelets 05/14/2022 285  150 - 400 K/uL Final   nRBC 05/14/2022 0.0  0.0 - 0.2 % Final   Performed at [facility], [place] [address], [place], [place] [address]   Color, Urine 05/14/2022 YELLOW  YELLOW Final   APPearance 05/14/2022 HAZY (A)  CLEAR Final   Specific Gravity, Urine 05/14/2022 1.029  1.005 - 1.030 Final    pH 05/14/2022 6.0  5.0 - 8.0 Final   Glucose, UA 05/14/2022 NEGATIVE  NEGATIVE mg/dL Final   Hgb urine dipstick 05/14/2022 NEGATIVE  NEGATIVE Final   Bilirubin Urine 05/14/2022 NEGATIVE  NEGATIVE Final   Ketones, ur 05/14/2022 NEGATIVE  NEGATIVE mg/dL Final   Protein, ur 05/14/2022 NEGATIVE  NEGATIVE mg/dL Final   Nitrite 05/14/2022 NEGATIVE  NEGATIVE Final   Leukocytes,Ua 05/14/2022 MODERATE (A)  NEGATIVE Final   RBC / HPF 05/14/2022 6-10  0 - 5 RBC/hpf Final   WBC, UA 05/14/2022 >50 (H)  0 - 5 WBC/hpf Final   Bacteria, UA 05/14/2022 NONE SEEN  NONE SEEN Final   Squamous Epithelial / LPF 05/14/2022 0-5  0 - 5 Final   Mucus 05/14/2022 PRESENT   Final   Performed at [facility], [place] [address], [place], [place] [address]   Glucose-Capillary 05/14/2022 107 (H)  70 - 99 mg/dL Final   Glucose reference range applies only to samples taken after fasting for at least 8 hours.   Troponin I (High Sensitivity) 05/14/2022 4  <18 ng/L Final   Comment: (NOTE) Elevated high sensitivity troponin I (hsTnI) values and significant  changes across serial measurements may suggest ACS but many other  chronic and acute conditions are known to elevate hsTnI results.  Refer to the "Links" section for chest pain algorithms and additional  guidance. Performed at [facility], [place] [address], [place], [place] [address]    Acetaminophen (Tylenol), Serum 05/14/2022 <10 (L)  10 - 30 ug/mL Final   Performed at [facility], [place] [address] [place], [place] [address]   Salicylate Lvl 84/69/6295 <7.0 (L)  7.0 - 30.0 mg/dL Final   Performed at [place] [address] [place], [place] [address]   Opiates 05/14/2022 NONE DETECTED  NONE DETECTED Final   Cocaine 05/14/2022 POSITIVE (A)  NONE DETECTED Final   Benzodiazepines 05/14/2022 NONE DETECTED  NONE DETECTED Final   Amphetamines 05/14/2022 NONE DETECTED  NONE DETECTED Final    Tetrahydrocannabinol 05/14/2022 POSITIVE (A)  NONE DETECTED Final   Barbiturates 05/14/2022 NONE DETECTED  NONE DETECTED Final   Comment: (NOTE) DRUG SCREEN FOR MEDICAL PURPOSES ONLY.  IF CONFIRMATION IS NEEDED FOR ANY PURPOSE, NOTIFY LAB WITHIN 5 DAYS.  LOWEST DETECTABLE LIMITS FOR URINE DRUG SCREEN Drug Class                     Cutoff (ng/mL) Amphetamine and metabolites    1000 Barbiturate and metabolites    200 Benzodiazepine                 244 Tricyclics and metabolites     300 Opiates and metabolites        300 Cocaine and metabolites        300 THC                            50 Performed at [facility], [place] [address] [place], [place]  [address]   Admission on 05/04/2022, Discharged on 05/04/2022  Component Date Value Ref Range Status   Sodium 05/04/2022 142  135 - 145 mmol/L Final   Potassium 05/04/2022 3.9  3.5 - 5.1 mmol/L Final   Chloride 05/04/2022 109  98 - 111 mmol/L Final   CO2 05/04/2022 27  22 - 32 mmol/L Final   Glucose, Bld 05/04/2022 66 (L)  70 - 99 mg/dL Final   Glucose reference range applies only to samples taken after fasting for at least 8 hours.   BUN 05/04/2022 17  6 - 20 mg/dL Final   Creatinine, Ser 05/04/2022 1.22  0.61 - 1.24 mg/dL Final   Calcium 05/04/2022 9.2  8.9 - 10.3 mg/dL Final   GFR, Estimated 05/04/2022 >60  >60 mL/min Final   Comment: (NOTE) Calculated using the CKD-EPI Creatinine Equation (2021)    Anion gap 05/04/2022 6  5 - 15 Final   Performed at [facility], [place] [address], [place], [place] [address]   WBC 05/04/2022 10.0  4.0 - 10.5 K/uL Final   RBC 05/04/2022 4.75  4.22 - 5.81 MIL/uL Final   Hemoglobin 05/04/2022 13.5  13.0 - 17.0 g/dL Final   HCT 05/04/2022 40.6  39.0 - 52.0 % Final   MCV 05/04/2022 85.5  80.0 - 100.0 fL Final   MCH 05/04/2022 28.4  26.0 - 34.0 pg Final   MCHC 05/04/2022 33.3  30.0 - 36.0 g/dL Final   RDW 05/04/2022 14.0  11.5 - 15.5 % Final   Platelets 05/04/2022 325  150 -  400 K/uL Final   nRBC 05/04/2022 0.0  0.0 - 0.2 % Final   Performed at [place] [address] [place], [place] [address]   Troponin I (High Sensitivity) 05/04/2022 4  <18 ng/L Final   Comment: (NOTE) Elevated high sensitivity troponin I (hsTnI) values and significant  changes across serial measurements may suggest ACS but many other  chronic and acute conditions are known to elevate hsTnI results.  Refer to the "Links" section for chest pain algorithms and additional  guidance. Performed at [facility], [place] [address], [place], [place] [address]    Troponin I (High Sensitivity) 05/04/2022 4  <18 ng/L Final   Comment: (NOTE) Elevated high sensitivity troponin I (hsTnI) values and significant  changes across serial measurements may suggest ACS but many other  chronic and acute conditions are known to elevate hsTnI results.  Refer to the "Links" section for chest pain algorithms and additional  guidance. Performed at [facility], [place] [address] [place], [place] [address]    Glucose-Capillary 05/04/2022 143 (H)  70 - 99 mg/dL Final   Glucose reference range applies only to samples taken after fasting for at least 8 hours.  Admission on 05/01/2022, Discharged on 05/01/2022  Component Date Value Ref Range Status   WBC 05/01/2022 8.8  4.0 - 10.5 K/uL Final   RBC 05/01/2022 4.68  4.22 - 5.81 MIL/uL Final   Hemoglobin 05/01/2022 13.3  13.0 - 17.0 g/dL Final   HCT 05/01/2022 39.1  39.0 - 52.0 % Final   MCV 05/01/2022 83.5  80.0 - 100.0 fL Final   MCH 05/01/2022 28.4  26.0 - 34.0 pg Final   MCHC 05/01/2022 34.0  30.0 - 36.0 g/dL Final   RDW 05/01/2022 14.1  11.5 - 15.5 % Final   Platelets 05/01/2022 302  150 - 400 K/uL Final   nRBC 05/01/2022 0.0  0.0 - 0.2 %  Final   Performed at [facility], [place] [address] [place], [place] [address]   Sodium 05/01/2022 139  135 - 145 mmol/L Final   Potassium 05/01/2022 3.3 (L)  3.5 - 5.1 mmol/L Final   Chloride  05/01/2022 107  98 - 111 mmol/L Final   CO2 05/01/2022 22  22 - 32 mmol/L Final   Glucose, Bld 05/01/2022 82  70 - 99 mg/dL Final   Glucose reference range applies only to samples taken after fasting for at least 8 hours.   BUN 05/01/2022 12  6 - 20 mg/dL Final   Creatinine, Ser 05/01/2022 0.85  0.61 - 1.24 mg/dL Final   Calcium 05/01/2022 8.8 (L)  8.9 - 10.3 mg/dL Final   GFR, Estimated 05/01/2022 >60  >60 mL/min Final   Comment: (NOTE) Calculated using the CKD-EPI Creatinine Equation (2021)    Anion gap 05/01/2022 10  5 - 15 Final   Performed at [facility], [place] [address] [name], [place] [address]   Salicylate Lvl 54/27/0623 <7.0 (L)  7.0 - 30.0 mg/dL Final   Performed at [facility] [address], [place], [place] [address]   Alcohol, Ethyl (B) 05/01/2022 47 (H)  <10 mg/dL Final   Comment: (NOTE) Lowest detectable limit for serum alcohol is 10 mg/dL.  For medical purposes only. Performed at [facility], [place] [address], [place], [place] [address]    SARS Coronavirus 2 by RT PCR 05/01/2022 NEGATIVE  NEGATIVE Final   Comment: (NOTE) SARS-CoV-2 target nucleic acids are NOT DETECTED.  The SARS-CoV-2 RNA is generally detectable in upper respiratory specimens during the acute phase of infection. The lowest concentration of SARS-CoV-2 viral copies this assay can detect is 138 copies/mL. A negative result does not preclude SARS-Cov-2 infection and should not be used as the sole basis for treatment or other patient management decisions. A negative result may occur with  improper specimen collection/handling, submission of specimen other than nasopharyngeal swab, presence of viral mutation(s) within the areas targeted by this assay, and inadequate number of viral copies(<138 copies/mL). A negative result must be combined with clinical observations, patient history, and epidemiological information. The expected result is Negative.  Fact Sheet for Patients:   EntrepreneurPulse.com.au  Fact Sheet for Healthcare Providers:  IncredibleEmployment.be  This test is no                          t yet approved or cleared by the [place] FDA and  has been authorized for detection and/or diagnosis of SARS-CoV-2 by FDA under an Emergency Use Authorization (EUA). This EUA will remain  in effect (meaning this test can be used) for the duration of the COVID-19 declaration under Section 564(b)(1) of the Act, 21 U.S.C.section 360bbb-3(b)(1), unless the authorization is terminated  or revoked sooner.       Influenza A by PCR 05/01/2022 NEGATIVE  NEGATIVE Final   Influenza B by PCR 05/01/2022 NEGATIVE  NEGATIVE Final   Comment: (NOTE) The Xpert Xpress SARS-CoV-2/FLU/RSV plus assay is intended as an aid in the diagnosis of influenza from Nasopharyngeal swab specimens and should not be used as a sole basis for treatment. Nasal washings and aspirates are unacceptable for Xpert Xpress SARS-CoV-2/FLU/RSV testing.  Fact Sheet for Patients: EntrepreneurPulse.com.au  Fact Sheet for Healthcare Providers: IncredibleEmployment.be  This test is not yet approved or cleared by the [place] FDA and has been authorized for detection and/or diagnosis of  SARS-CoV-2 by FDA under an Emergency Use Authorization (EUA). This EUA will remain in effect (meaning this test can be used) for the duration of the COVID-19 declaration under Section 564(b)(1) of the Act, 21 U.S.C. section 360bbb-3(b)(1), unless the authorization is terminated or revoked.  Performed at [facility], [place] [address], [place], [place] [address]   Admission on 04/28/2022, Discharged on 04/28/2022  Component Date Value Ref Range Status   Sodium 04/28/2022 137  135 - 145 mmol/L Final   Potassium 04/28/2022 3.5  3.5 - 5.1 mmol/L Final   Chloride 04/28/2022 108  98 - 111 mmol/L Final   CO2 04/28/2022 22  22 - 32  mmol/L Final   Glucose, Bld 04/28/2022 93  70 - 99 mg/dL Final   Glucose reference range applies only to samples taken after fasting for at least 8 hours.   BUN 04/28/2022 14  6 - 20 mg/dL Final   Creatinine, Ser 04/28/2022 0.88  0.61 - 1.24 mg/dL Final   Calcium 04/28/2022 8.7 (L)  8.9 - 10.3 mg/dL Final   Total Protein 04/28/2022 6.3 (L)  6.5 - 8.1 g/dL Final   Albumin 04/28/2022 3.7  3.5 - 5.0 g/dL Final   AST 04/28/2022 27  15 - 41 U/L Final   ALT 04/28/2022 17  0 - 44 U/L Final   Alkaline Phosphatase 04/28/2022 50  38 - 126 U/L Final   Total Bilirubin 04/28/2022 0.4  0.3 - 1.2 mg/dL Final   GFR, Estimated 04/28/2022 >60  >60 mL/min Final   Comment: (NOTE) Calculated using the CKD-EPI Creatinine Equation (2021)    Anion gap 04/28/2022 7  5 - 15 Final   Performed at [facility] [address], [place], [place] [address]   Alcohol, Ethyl (B) 04/28/2022 71 (H)  <10 mg/dL Final   Comment: (NOTE) Lowest detectable limit for serum alcohol is 10 mg/dL.  For medical purposes only. Performed at [facility], [place] [address] [place], [place] [address]    Salicylate Lvl 94/49/6759 <7.0 (L)  7.0 - 30.0 mg/dL Final   Performed at [facility] [address] [place], [place] [address]   Acetaminophen (Tylenol), Serum 04/28/2022 <10 (L)  10 - 30 ug/mL Final   Comment: (NOTE) Therapeutic concentrations vary significantly. A range of 10-30 ug/mL  may be an effective concentration for many patients. However, some  are best treated at concentrations outside of this range. Acetaminophen concentrations >150 ug/mL at 4 hours after ingestion  and >50 ug/mL at 12 hours after ingestion are often associated with  toxic reactions.  Performed at [facility], [place] [address], [place], [place] [address]    WBC 04/28/2022 10.8 (H)  4.0 - 10.5 K/uL Final   RBC 04/28/2022 4.40  4.22 - 5.81 MIL/uL Final   Hemoglobin 04/28/2022 12.5 (L)  13.0 - 17.0 g/dL Final   HCT 04/28/2022  37.3 (L)  39.0 - 52.0 % Final   MCV 04/28/2022 84.8  80.0 - 100.0 fL Final   MCH 04/28/2022 28.4  26.0 - 34.0 pg Final   MCHC 04/28/2022 33.5  30.0 - 36.0 g/dL Final   RDW 04/28/2022 14.0  11.5 - 15.5 % Final   Platelets 04/28/2022 288  150 - 400 K/uL Final   nRBC 04/28/2022 0.0  0.0 - 0.2 % Final   Performed at [facility] [address] [place],  [address]   Opiates 04/28/2022 NONE DETECTED  NONE DETECTED Final  Cocaine 04/28/2022 POSITIVE (A)  NONE DETECTED Final   Benzodiazepines 04/28/2022 NONE DETECTED  NONE DETECTED Final   Amphetamines 04/28/2022 NONE DETECTED  NONE DETECTED Final   Tetrahydrocannabinol 04/28/2022 POSITIVE (A)  NONE DETECTED Final   Barbiturates 04/28/2022 NONE DETECTED  NONE DETECTED Final   Comment: (NOTE) DRUG SCREEN FOR MEDICAL PURPOSES ONLY.  IF CONFIRMATION IS NEEDED FOR ANY PURPOSE, NOTIFY LAB WITHIN 5 DAYS.  LOWEST DETECTABLE LIMITS FOR URINE DRUG SCREEN Drug Class                     Cutoff (ng/mL) Amphetamine and metabolites    1000 Barbiturate and metabolites    200 Benzodiazepine                 892 Tricyclics and metabolites     300 Opiates and metabolites        300 Cocaine and metabolites        300 THC                            50 Performed at [facility], [place] [address] [place], [place] [address]    SARS Coronavirus 2 by RT PCR 04/28/2022 NEGATIVE  NEGATIVE Final   Comment: (NOTE) SARS-CoV-2 target nucleic acids are NOT DETECTED.  The SARS-CoV-2 RNA is generally detectable in upper respiratory specimens during the acute phase of infection. The lowest concentration of SARS-CoV-2 viral copies this assay can detect is 138 copies/mL. A negative result does not preclude SARS-Cov-2 infection and should not be used as the sole basis for treatment or other patient management decisions. A negative result may occur with  improper specimen collection/handling, submission of specimen other than nasopharyngeal swab,  presence of viral mutation(s) within the areas targeted by this assay, and inadequate number of viral copies(<138 copies/mL). A negative result must be combined with clinical observations, patient history, and epidemiological information. The expected result is Negative.  Fact Sheet for Patients:  EntrepreneurPulse.com.au  Fact Sheet for Healthcare Providers:  IncredibleEmployment.be  This test is no                          t yet approved or cleared by the [place] FDA and  has been authorized for detection and/or diagnosis of SARS-CoV-2 by FDA under an Emergency Use Authorization (EUA). This EUA will remain  in effect (meaning this test can be used) for the duration of the COVID-19 declaration under Section 564(b)(1) of the Act, 21 U.S.C.section 360bbb-3(b)(1), unless the authorization is terminated  or revoked sooner.       Influenza A by PCR 04/28/2022 NEGATIVE  NEGATIVE Final   Influenza B by PCR 04/28/2022 NEGATIVE  NEGATIVE Final   Comment: (NOTE) The Xpert Xpress SARS-CoV-2/FLU/RSV plus assay is intended as an aid in the diagnosis of influenza from Nasopharyngeal swab specimens and should not be used as a sole basis for treatment. Nasal washings and aspirates are unacceptable for Xpert Xpress SARS-CoV-2/FLU/RSV testing.  Fact Sheet for Patients: EntrepreneurPulse.com.au  Fact Sheet for Healthcare Providers: IncredibleEmployment.be  This test is not yet approved or cleared by the [place] FDA and has been authorized for detection and/or diagnosis of SARS-CoV-2 by FDA under an Emergency Use Authorization (EUA). This EUA will remain in effect (meaning this test can be used) for the duration of the COVID-19 declaration under Section 564(b)(1) of the Act, 21 U.S.C. section 360bbb-3(b)(1),  unless the authorization is terminated or revoked.  Performed at [facility], [place] [address], [place], [place] [address]   Admission on 04/17/2022, Discharged on 04/17/2022  Component Date Value Ref Range Status   Sodium 04/17/2022 139  135 - 145 mmol/L Final   Potassium 04/17/2022 4.8  3.5 - 5.1 mmol/L Final   Chloride 04/17/2022 102  98 - 111 mmol/L Final   CO2 04/17/2022 20 (L)  22 - 32 mmol/L Final   Glucose, Bld 04/17/2022 198 (H)  70 - 99 mg/dL Final   Glucose reference range applies only to samples taken after fasting for at least 8 hours.   BUN 04/17/2022 24 (H)  6 - 20 mg/dL Final   Creatinine, Ser 04/17/2022 1.43 (H)  0.61 - 1.24 mg/dL Final   Calcium 04/17/2022 9.5  8.9 - 10.3 mg/dL Final   Total Protein 04/17/2022 7.8  6.5 - 8.1 g/dL Final   Albumin 04/17/2022 4.5  3.5 - 5.0 g/dL Final   AST 04/17/2022 40  15 - 41 U/L Final   ALT 04/17/2022 27  0 - 44 U/L Final   Alkaline Phosphatase 04/17/2022 63  38 - 126 U/L Final   Total Bilirubin 04/17/2022 0.6  0.3 - 1.2 mg/dL Final   GFR, Estimated 04/17/2022 >60  >60 mL/min Final   Comment: (NOTE) Calculated using the CKD-EPI Creatinine Equation (2021)    Anion gap 04/17/2022 17 (H)  5 - 15 Final   Performed at [facility] [address] [place], [place] [address]   WBC 04/17/2022 26.8 (H)  4.0 - 10.5 K/uL Final   RBC 04/17/2022 5.69  4.22 - 5.81 MIL/uL Final   Hemoglobin 04/17/2022 15.7  13.0 - 17.0 g/dL Final   HCT 04/17/2022 49.0  39.0 - 52.0 % Final   MCV 04/17/2022 86.1  80.0 - 100.0 fL Final   MCH 04/17/2022 27.6  26.0 - 34.0 pg Final   MCHC 04/17/2022 32.0  30.0 - 36.0 g/dL Final   RDW 04/17/2022 14.4  11.5 - 15.5 % Final   Platelets 04/17/2022 143 (L)  150 - 400 K/uL Final   REPEATED TO VERIFY   nRBC 04/17/2022 0.0  0.0 - 0.2 % Final   Neutrophils Relative % 04/17/2022 88  % Final   Neutro Abs 04/17/2022 23.4 (H)  1.7 - 7.7 K/uL Final   Lymphocytes Relative 04/17/2022 5  % Final   Lymphs Abs 04/17/2022 1.4  0.7 - 4.0 K/uL Final   Monocytes Relative 04/17/2022 6  % Final   Monocytes Absolute  04/17/2022 1.6 (H)  0.1 - 1.0 K/uL Final   Eosinophils Relative 04/17/2022 0  % Final   Eosinophils Absolute 04/17/2022 0.1  0.0 - 0.5 K/uL Final   Basophils Relative 04/17/2022 0  % Final   Basophils Absolute 04/17/2022 0.1  0.0 - 0.1 K/uL Final   Immature Granulocytes 04/17/2022 1  % Final   Abs Immature Granulocytes 04/17/2022 0.27 (H)  0.00 - 0.07 K/uL Final   Performed at [facility], [place] [address], [place], [place] [address]   Sodium 04/17/2022 137  135 - 145 mmol/L Final   Potassium 04/17/2022 5.0  3.5 - 5.1 mmol/L Final   Chloride 04/17/2022 102  98 - 111 mmol/L Final   BUN 04/17/2022 32 (H)  6 - 20 mg/dL Final   Creatinine, Ser 04/17/2022 1.20  0.61 - 1.24 mg/dL Final   Glucose, Bld 04/17/2022 197 (H)  70 - 99 mg/dL Final  Glucose reference range applies only to samples taken after fasting for at least 8 hours.   Calcium, Ion 04/17/2022 1.00 (L)  1.15 - 1.40 mmol/L Final   TCO2 04/17/2022 29  22 - 32 mmol/L Final   Hemoglobin 04/17/2022 17.3 (H)  13.0 - 17.0 g/dL Final   HCT 04/17/2022 51.0  39.0 - 52.0 % Final   pH, Ven 04/17/2022 7.269  7.25 - 7.43 Final   pCO2, Ven 04/17/2022 58.3  44 - 60 mmHg Final   pO2, Ven 04/17/2022 204 (H)  32 - 45 mmHg Final   Bicarbonate 04/17/2022 26.7  20.0 - 28.0 mmol/L Final   TCO2 04/17/2022 28  22 - 32 mmol/L Final   O2 Saturation 04/17/2022 100  % Final   Acid-base deficit 04/17/2022 2.0  0.0 - 2.0 mmol/L Final   Sodium 04/17/2022 136  135 - 145 mmol/L Final   Potassium 04/17/2022 5.0  3.5 - 5.1 mmol/L Final   Calcium, Ion 04/17/2022 1.05 (L)  1.15 - 1.40 mmol/L Final   HCT 04/17/2022 50.0  39.0 - 52.0 % Final   Hemoglobin 04/17/2022 17.0  13.0 - 17.0 g/dL Final   Sample type 04/17/2022 VENOUS   Final   Alcohol, Ethyl (B) 04/17/2022 <10  <10 mg/dL Final   Comment: (NOTE) Lowest detectable limit for serum alcohol is 10 mg/dL.  For medical purposes only. Performed at [facility], [place] [address] [place],  [place] [address]    Salicylate Lvl 76/16/0737 <7.0 (L)  7.0 - 30.0 mg/dL Final   Performed at [facility] [address], [place], [place] [address]   Acetaminophen (Tylenol), Serum 04/17/2022 <10 (L)  10 - 30 ug/mL Final   Comment: (NOTE) Therapeutic concentrations vary significantly. A range of 10-30 ug/mL  may be an effective concentration for many patients. However, some  are best treated at concentrations outside of this range. Acetaminophen concentrations >150 ug/mL at 4 hours after ingestion  and >50 ug/mL at 12 hours after ingestion are often associated with  toxic reactions.  Performed at [facility], [place] [address] [place], [place] [address]    Total CK 04/17/2022 343  49 - 397 U/L Final   Performed at [facility] [address], [place], [place] [address]  Admission on 04/16/2022, Discharged on 04/16/2022  Component Date Value Ref Range Status   SARS Coronavirus 2 by RT PCR 04/16/2022 NEGATIVE  NEGATIVE Final   Comment: (NOTE) SARS-CoV-2 target nucleic acids are NOT DETECTED.  The SARS-CoV-2 RNA is generally detectable in upper respiratory specimens during the acute phase of infection. The lowest concentration of SARS-CoV-2 viral copies this assay can detect is 138 copies/mL. A negative result does not preclude SARS-Cov-2 infection and should not be used as the sole basis for treatment or other patient management decisions. A negative result may occur with  improper specimen collection/handling, submission of specimen other than nasopharyngeal swab, presence of viral mutation(s) within the areas targeted by this assay, and inadequate number of viral copies(<138 copies/mL). A negative result must be combined with clinical observations, patient history, and epidemiological information. The expected result is Negative.  Fact Sheet for Patients:  EntrepreneurPulse.com.au  Fact Sheet for Healthcare Providers:   IncredibleEmployment.be  This test is no                          t yet approved or cleared by the [place] FDA and  has  been authorized for detection and/or diagnosis of SARS-CoV-2 by FDA under an Emergency Use Authorization (EUA). This EUA will remain  in effect (meaning this test can be used) for the duration of the COVID-19 declaration under Section 564(b)(1) of the Act, 21 U.S.C.section 360bbb-3(b)(1), unless the authorization is terminated  or revoked sooner.       Influenza A by PCR 04/16/2022 NEGATIVE  NEGATIVE Final   Influenza B by PCR 04/16/2022 NEGATIVE  NEGATIVE Final   Comment: (NOTE) The Xpert Xpress SARS-CoV-2/FLU/RSV plus assay is intended as an aid in the diagnosis of influenza from Nasopharyngeal swab specimens and should not be used as a sole basis for treatment. Nasal washings and aspirates are unacceptable for Xpert Xpress SARS-CoV-2/FLU/RSV testing.  Fact Sheet for Patients: EntrepreneurPulse.com.au  Fact Sheet for Healthcare Providers: IncredibleEmployment.be  This test is not yet approved or cleared by the [place] FDA and has been authorized for detection and/or diagnosis of SARS-CoV-2 by FDA under an Emergency Use Authorization (EUA). This EUA will remain in effect (meaning this test can be used) for the duration of the COVID-19 declaration under Section 564(b)(1) of the Act, 21 U.S.C. section 360bbb-3(b)(1), unless the authorization is terminated or revoked.  Performed at [facility], [place] [address] [place], [place] [address]    WBC 04/16/2022 10.0  4.0 - 10.5 K/uL Final   RBC 04/16/2022 5.09  4.22 - 5.81 MIL/uL Final   Hemoglobin 04/16/2022 14.3  13.0 - 17.0 g/dL Final   HCT 04/16/2022 42.0  39.0 - 52.0 % Final   MCV 04/16/2022 82.5  80.0 - 100.0 fL Final   MCH 04/16/2022 28.1  26.0 - 34.0 pg Final   MCHC 04/16/2022 34.0  30.0 - 36.0 g/dL Final   RDW 04/16/2022 14.1   11.5 - 15.5 % Final   Platelets 04/16/2022 308  150 - 400 K/uL Final   nRBC 04/16/2022 0.0  0.0 - 0.2 % Final   Neutrophils Relative % 04/16/2022 69  % Final   Neutro Abs 04/16/2022 7.0  1.7 - 7.7 K/uL Final   Lymphocytes Relative 04/16/2022 22  % Final   Lymphs Abs 04/16/2022 2.2  0.7 - 4.0 K/uL Final   Monocytes Relative 04/16/2022 6  % Final   Monocytes Absolute 04/16/2022 0.6  0.1 - 1.0 K/uL Final   Eosinophils Relative 04/16/2022 2  % Final   Eosinophils Absolute 04/16/2022 0.2  0.0 - 0.5 K/uL Final   Basophils Relative 04/16/2022 1  % Final   Basophils Absolute 04/16/2022 0.1  0.0 - 0.1 K/uL Final   Immature Granulocytes 04/16/2022 0  % Final   Abs Immature Granulocytes 04/16/2022 0.02  0.00 - 0.07 K/uL Final   Performed at [facility], [place] [address] [place], [place] [address]   Sodium 04/16/2022 135  135 - 145 mmol/L Final   Potassium 04/16/2022 3.5  3.5 - 5.1 mmol/L Final   Chloride 04/16/2022 97 (L)  98 - 111 mmol/L Final   CO2 04/16/2022 28  22 - 32 mmol/L Final   Glucose, Bld 04/16/2022 79  70 - 99 mg/dL Final   Glucose reference range applies only to samples taken after fasting for at least 8 hours.   BUN 04/16/2022 10  6 - 20 mg/dL Final   Creatinine, Ser 04/16/2022 0.87  0.61 - 1.24 mg/dL Final   Calcium 04/16/2022 9.4  8.9 - 10.3 mg/dL Final   Total Protein 04/16/2022 7.2  6.5 - 8.1 g/dL Final   Albumin 04/16/2022  4.1  3.5 - 5.0 g/dL Final   AST 04/16/2022 29  15 - 41 U/L Final   ALT 04/16/2022 22  0 - 44 U/L Final   Alkaline Phosphatase 04/16/2022 53  38 - 126 U/L Final   Total Bilirubin 04/16/2022 0.6  0.3 - 1.2 mg/dL Final   GFR, Estimated 04/16/2022 >60  >60 mL/min Final   Comment: (NOTE) Calculated using the CKD-EPI Creatinine Equation (2021)    Anion gap 04/16/2022 10  5 - 15 Final   Performed at [facility] [address] [place], [place] [address]   Hgb A1c MFr Bld 04/16/2022 5.2  4.8 - 5.6 % Final   Comment: (NOTE) Pre diabetes:           5.7%-6.4%  Diabetes:              >6.4%  Glycemic control for   <7.0% adults with diabetes    Mean Plasma Glucose 04/16/2022 102.54  mg/dL Final   Performed at [facility], [place] [address] [place], [place] [address]   Cholesterol 04/16/2022 190  0 - 200 mg/dL Final   Triglycerides 04/16/2022 40  <150 mg/dL Final   HDL 04/16/2022 90  >40 mg/dL Final   Total CHOL/HDL Ratio 04/16/2022 2.1  RATIO Final   VLDL 04/16/2022 8  0 - 40 mg/dL Final   LDL Cholesterol 04/16/2022 92  0 - 99 mg/dL Final   Comment:        Total Cholesterol/HDL:CHD Risk Coronary Heart Disease Risk Table                     Men   Women  1/2 Average Risk   3.4   3.3  Average Risk       5.0   4.4  2 X Average Risk   9.6   7.1  3 X Average Risk  23.4   11.0        Use the calculated Patient Ratio above and the CHD Risk Table to determine the patient's CHD Risk.        ATP III CLASSIFICATION (LDL):  <100     mg/dL   Optimal  100-129  mg/dL   Near or Above                    Optimal  130-159  mg/dL   Borderline  160-189  mg/dL   High  >190     mg/dL   Very High Performed at [facility] [address], [place], [place] [address]    TSH 04/16/2022 1.509  0.350 - 4.500 uIU/mL Final   Comment: Performed by a 3rd Generation assay with a functional sensitivity of <=0.01 uIU/mL. Performed at [facility], [place] [address], [place], [place] [address]    POC Amphetamine UR 04/16/2022 None Detected  NONE DETECTED (Cut Off Level 1000 ng/mL) Final   POC Secobarbital (BAR) 04/16/2022 None Detected  NONE DETECTED (Cut Off Level 300 ng/mL) Final   POC Buprenorphine (BUP) 04/16/2022 None Detected  NONE DETECTED (Cut Off Level 10 ng/mL) Final   POC Oxazepam (BZO) 04/16/2022 None Detected  NONE DETECTED (Cut Off Level 300 ng/mL) Final   POC Cocaine UR 04/16/2022 Positive (A)  NONE DETECTED (Cut Off Level 300 ng/mL) Final   POC Methamphetamine UR 04/16/2022 None Detected  NONE DETECTED (Cut Off Level 1000  ng/mL) Final   POC Morphine 04/16/2022 None Detected  NONE DETECTED (Cut  Off Level 300 ng/mL) Final   POC Oxycodone UR 04/16/2022 None Detected  NONE DETECTED (Cut Off Level 100 ng/mL) Final   POC Methadone UR 04/16/2022 None Detected  NONE DETECTED (Cut Off Level 300 ng/mL) Final   POC Marijuana UR 04/16/2022 None Detected  NONE DETECTED (Cut Off Level 50 ng/mL) Final   SARSCOV2ONAVIRUS 2 AG 04/16/2022 NEGATIVE  NEGATIVE Final   Comment: (NOTE) SARS-CoV-2 antigen NOT DETECTED.   Negative results are presumptive.  Negative results do not preclude SARS-CoV-2 infection and should not be used as the sole basis for treatment or other patient management decisions, including infection  control decisions, particularly in the presence of clinical signs and  symptoms consistent with COVID-19, or in those who have been in contact with the virus.  Negative results must be combined with clinical observations, patient history, and epidemiological information. The expected result is Negative.  Fact Sheet for Patients: HandmadeRecipes.com.cy  Fact Sheet for Healthcare Providers: FuneralLife.at  This test is not yet approved or cleared by the [place] FDA and  has been authorized for detection and/or diagnosis of SARS-CoV-2 by FDA under an Emergency Use Authorization (EUA).  This EUA will remain in effect (meaning this test can be used) for the duration of  the COV                          ID-19 declaration under Section 564(b)(1) of the Act, 21 U.S.C. section 360bbb-3(b)(1), unless the authorization is terminated or revoked sooner.    Admission on 04/04/2022, Discharged on 04/05/2022  Component Date Value Ref Range Status   SARS Coronavirus 2 by RT PCR 04/04/2022 NEGATIVE  NEGATIVE Final   Comment: (NOTE) SARS-CoV-2 target nucleic acids are NOT DETECTED.  The SARS-CoV-2 RNA is generally detectable in upper respiratory specimens during the  acute phase of infection. The lowest concentration of SARS-CoV-2 viral copies this assay can detect is 138 copies/mL. A negative result does not preclude SARS-Cov-2 infection and should not be used as the sole basis for treatment or other patient management decisions. A negative result may occur with  improper specimen collection/handling, submission of specimen other than nasopharyngeal swab, presence of viral mutation(s) within the areas targeted by this assay, and inadequate number of viral copies(<138 copies/mL). A negative result must be combined with clinical observations, patient history, and epidemiological information. The expected result is Negative.  Fact Sheet for Patients:  EntrepreneurPulse.com.au  Fact Sheet for Healthcare Providers:  IncredibleEmployment.be  This test is no                          t yet approved or cleared by the [place] FDA and  has been authorized for detection and/or diagnosis of SARS-CoV-2 by FDA under an Emergency Use Authorization (EUA). This EUA will remain  in effect (meaning this test can be used) for the duration of the COVID-19 declaration under Section 564(b)(1) of the Act, 21 U.S.C.section 360bbb-3(b)(1), unless the authorization is terminated  or revoked sooner.       Influenza A by PCR 04/04/2022 NEGATIVE  NEGATIVE Final   Influenza B by PCR 04/04/2022 NEGATIVE  NEGATIVE Final   Comment: (NOTE) The Xpert Xpress SARS-CoV-2/FLU/RSV plus assay is intended as an aid in the diagnosis of influenza from Nasopharyngeal swab specimens and should not be used as a sole basis for treatment. Nasal washings and aspirates are unacceptable for Xpert Xpress SARS-CoV-2/FLU/RSV testing.  Fact Sheet  for Patients: EntrepreneurPulse.com.au  Fact Sheet for Healthcare Providers: IncredibleEmployment.be  This test is not yet approved or cleared by the [place] FDA  and has been authorized for detection and/or diagnosis of SARS-CoV-2 by FDA under an Emergency Use Authorization (EUA). This EUA will remain in effect (meaning this test can be used) for the duration of the COVID-19 declaration under Section 564(b)(1) of the Act, 21 U.S.C. section 360bbb-3(b)(1), unless the authorization is terminated or revoked.  Performed at [facility], [place] [address] [place], [place] [address]    WBC 04/04/2022 6.6  4.0 - 10.5 K/uL Final   RBC 04/04/2022 5.41  4.22 - 5.81 MIL/uL Final   Hemoglobin 04/04/2022 14.9  13.0 - 17.0 g/dL Final   HCT 04/04/2022 44.3  39.0 - 52.0 % Final   MCV 04/04/2022 81.9  80.0 - 100.0 fL Final   MCH 04/04/2022 27.5  26.0 - 34.0 pg Final   MCHC 04/04/2022 33.6  30.0 - 36.0 g/dL Final   RDW 04/04/2022 14.5  11.5 - 15.5 % Final   Platelets 04/04/2022 328  150 - 400 K/uL Final   nRBC 04/04/2022 0.0  0.0 - 0.2 % Final   Neutrophils Relative % 04/04/2022 52  % Final   Neutro Abs 04/04/2022 3.5  1.7 - 7.7 K/uL Final   Lymphocytes Relative 04/04/2022 33  % Final   Lymphs Abs 04/04/2022 2.2  0.7 - 4.0 K/uL Final   Monocytes Relative 04/04/2022 10  % Final   Monocytes Absolute 04/04/2022 0.6  0.1 - 1.0 K/uL Final   Eosinophils Relative 04/04/2022 4  % Final   Eosinophils Absolute 04/04/2022 0.2  0.0 - 0.5 K/uL Final   Basophils Relative 04/04/2022 1  % Final   Basophils Absolute 04/04/2022 0.1  0.0 - 0.1 K/uL Final   Immature Granulocytes 04/04/2022 0  % Final   Abs Immature Granulocytes 04/04/2022 0.01  0.00 - 0.07 K/uL Final   Performed at [facility], [place] [address], [place], [place] [address]   Sodium 04/04/2022 140  135 - 145 mmol/L Final   Potassium 04/04/2022 3.5  3.5 - 5.1 mmol/L Final   Chloride 04/04/2022 105  98 - 111 mmol/L Final   CO2 04/04/2022 29  22 - 32 mmol/L Final   Glucose, Bld 04/04/2022 60 (L)  70 - 99 mg/dL Final   Glucose reference range applies only to samples taken after fasting for at least 8  hours.   BUN 04/04/2022 25 (H)  6 - 20 mg/dL Final   Creatinine, Ser 04/04/2022 1.14  0.61 - 1.24 mg/dL Final   Calcium 04/04/2022 9.5  8.9 - 10.3 mg/dL Final   Total Protein 04/04/2022 7.1  6.5 - 8.1 g/dL Final   Albumin 04/04/2022 4.0  3.5 - 5.0 g/dL Final   AST 04/04/2022 33  15 - 41 U/L Final   ALT 04/04/2022 23  0 - 44 U/L Final   Alkaline Phosphatase 04/04/2022 49  38 - 126 U/L Final   Total Bilirubin 04/04/2022 0.9  0.3 - 1.2 mg/dL Final   GFR, Estimated 04/04/2022 >60  >60 mL/min Final   Comment: (NOTE) Calculated using the CKD-EPI Creatinine Equation (2021)    Anion gap 04/04/2022 6  5 - 15 Final   Performed at [place] [address], [place], [place] [address]   Hgb A1c MFr Bld 04/04/2022 5.0  4.8 - 5.6 % Final   Comment: (NOTE) Pre diabetes:  5.7%-6.4%  Diabetes:              >6.4%  Glycemic control for   <7.0% adults with diabetes    Mean Plasma Glucose 04/04/2022 96.8  mg/dL Final   Performed at [facility] [address], [place], [place] [address]   POC Amphetamine UR 04/05/2022 Positive (A)  NONE DETECTED (Cut Off Level 1000 ng/mL) Preliminary   POC Secobarbital (BAR) 04/05/2022 None Detected  NONE DETECTED (Cut Off Level 300 ng/mL) Preliminary   POC Buprenorphine (BUP) 04/05/2022 None Detected  NONE DETECTED (Cut Off Level 10 ng/mL) Preliminary   POC Oxazepam (BZO) 04/05/2022 None Detected  NONE DETECTED (Cut Off Level 300 ng/mL) Preliminary   POC Cocaine UR 04/05/2022 Positive (A)  NONE DETECTED (Cut Off Level 300 ng/mL) Preliminary   POC Methamphetamine UR 04/05/2022 Positive (A)  NONE DETECTED (Cut Off Level 1000 ng/mL) Preliminary   POC Morphine 04/05/2022 None Detected  NONE DETECTED (Cut Off Level 300 ng/mL) Preliminary   POC Oxycodone UR 04/05/2022 None Detected  NONE DETECTED (Cut Off Level 100 ng/mL) Preliminary   POC Methadone UR 04/05/2022 None Detected  NONE DETECTED (Cut Off Level 300 ng/mL) Preliminary   POC Marijuana UR  04/05/2022 Positive (A)  NONE DETECTED (Cut Off Level 50 ng/mL) Preliminary   SARS Coronavirus 2 Ag 04/04/2022 Negative  Negative Preliminary   Cholesterol 04/04/2022 202 (H)  0 - 200 mg/dL Final   Triglycerides 04/04/2022 53  <150 mg/dL Final   HDL 04/04/2022 78  >40 mg/dL Final   Total CHOL/HDL Ratio 04/04/2022 2.6  RATIO Final   VLDL 04/04/2022 11  0 - 40 mg/dL Final   LDL Cholesterol 04/04/2022 113 (H)  0 - 99 mg/dL Final   Comment:        Total Cholesterol/HDL:CHD Risk Coronary Heart Disease Risk Table                     Men   Women  1/2 Average Risk   3.4   3.3  Average Risk       5.0   4.4  2 X Average Risk   9.6   7.1  3 X Average Risk  23.4   11.0        Use the calculated Patient Ratio above and the CHD Risk Table to determine the patient's CHD Risk.        ATP III CLASSIFICATION (LDL):  <100     mg/dL   Optimal  100-129  mg/dL   Near or Above                    Optimal  130-159  mg/dL   Borderline  160-189  mg/dL   High  >190     mg/dL   Very High Performed at [facility] [address], [place], [place] [address]   There may be more visits with results that are not included.    Allergies: Patient has no known allergies.  PTA Medications: (Not in a hospital admission)   Medical Decision Making  Inpatient observation  Lab Orders         Resp Panel by RT-PCR (Flu A&B, Covid) Anterior Nasal Swab         CBC with Differential/Platelet         Comprehensive metabolic panel         Hemoglobin A1c         Lipid panel  TSH         POCT Urine Drug Screen - (I-Screen)         POC SARS Coronavirus 2 Ag     Meds ordered this encounter  Medications   acetaminophen (TYLENOL) tablet 650 mg   alum & mag hydroxide-simeth (MAALOX/MYLANTA) 200-200-20 MG/5ML suspension 30 mL   magnesium hydroxide (MILK OF MAGNESIA) suspension 30 mL   traZODone (DESYREL) tablet 50 mg     Meds ordered this encounter  Medications   acetaminophen (TYLENOL) tablet 650 mg    alum & mag hydroxide-simeth (MAALOX/MYLANTA) 200-200-20 MG/5ML suspension 30 mL   magnesium hydroxide (MILK OF MAGNESIA) suspension 30 mL   traZODone (DESYREL) tablet 50 mg    Lab Orders         Resp Panel by RT-PCR (Flu A&B, Covid) Anterior Nasal Swab         CBC with Differential/Platelet         Comprehensive metabolic panel         Hemoglobin A1c         Lipid panel         TSH         POCT Urine Drug Screen - (I-Screen)         POC SARS Coronavirus 2 Ag        Recommendations  Based on my evaluation the patient does not appear to have an emergency medical condition.  Evette Georges, NP 05/30/22  5:39 AM

## 2022-05-30 NOTE — ED Provider Notes (Signed)
FBC/OBS ASAP Discharge Summary  Date and Time: 05/30/2022 9:30 AM  Name: Tyler White  MRN:  [id number]   Discharge Diagnoses:  Final diagnoses:  Suicidal ideation  Hx of medication noncompliance  Schizoaffective disorder, unspecified type ([place])   Subjective:   Pt assessed face to face by nurse practitioner today.  Per chart review, pt has had 22 ED visits in the past 6 months. Prior to his presentation to [facility] earlier today, he was last seen at [facility] on 05/23/22 twice.  Pt states "I'm not suicidal today but I'm homicidal". Pt denies identified individuals. States he has plan that involves a gun. When asked about intent, pt denies intent. Per note from earlier today, pt denied access to a gun. At present, he also denies he has access to a gun. Pt questioned further about this. Pt reports, "I just come here to get admitted." Pt asked reasons for wanting to be admitted, he states "I'm trying to get benefits". Pt denies SI/VI/HI, plan or intent.   He reports AH, states he does not know what they are saying. States AH have been present since he was 26 y/o. He is not currently distressed by [facility]. He denies current VH. He denies paranoia.  He reports use of crack/cocaine, "almost every day", estimates $100 of use/day. He states he gets money from his friends and family. He states he is interested in reducing use.   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   Pt reminded of his [place] appointment today. Pt states he does not want to get his LAI. Discussed risks/benefits of medication, recommended pt attend his appointment and speak w/ his provider. Pt agrees w/ recommendation. Discussed pt should not make medication changes w/o speaking w/ his provider. Pt verbalized understanding.  Pt verbalizes readiness to discharge and contracts to safety. Safety planning completed and pt agrees to call 911/EMS or go to the nearest  emergency room for worsening condition. He agrees to follow up with outpatient resources. He requests breakfast before discharge.  Pt is a&ox3, in no acute distress, non-toxic appearing. He appears disheveled. Eye contact is minimal. Speech is clear and coherent, w/ nml rate and volume. Reported mood is depressed. Affect is flat. TP is coherent, goal directed, linear. Description of associations is intact. TC is logical. There is no evidence of internal preoccupation, agitation, aggression or distractibility. No delusions elicited. Pt is calm, cooperative.  Stay Summary:  Pt is a 26 y/o male w/ hx of schizophrenia presenting to [facility] on 05/30/22 w/ SI and plan to OD on street drugs. On reassessment, pt denies SI/VI/HI, plan or intent. Outpatient resources provided. Pt discharged.  Total Time spent with patient: 20 minutes  Past Psychiatric History: Per chart review, hx of schizophrenia Past Medical History:  Past Medical History:  Diagnosis Date   Hernia, inguinal, right    Inguinal hernia    right   Psychiatric illness    Schizophrenia ([place])    No past surgical history on file. Family History:  Family History  Problem Relation Age of Onset   Psychiatric Illness Mother    Hypertension Sister    Family Psychiatric History: Pt denies knowledge of family psychiatric hx Social History:  Social History   Substance and Sexual Activity  Alcohol Use Not Currently     Social History   Substance and Sexual Activity  Drug Use Not Currently   Types: Marijuana, Cocaine  Comment: denies currently    Social History   Socioeconomic History   Marital status: Single    Spouse name: Not on file   Number of children: Not on file   Years of education: 10   Highest education level: Not on file  Occupational History   Not on file  Tobacco Use   Smoking status: Every Day    Packs/day: 0.50    Years: 5.00    Pack years: 2.50    Types: Cigarettes   Smokeless tobacco: Never  Vaping Use    Vaping Use: Never used  Substance and Sexual Activity   Alcohol use: Not Currently   Drug use: Not Currently    Types: Marijuana, Cocaine    Comment: denies currently   Sexual activity: Yes    Birth control/protection: None  Other Topics Concern   Not on file  Social History Narrative   ** Merged History Encounter **       ** Merged History Encounter **       Social Determinants of Health   Financial Resource Strain: Not on file  Food Insecurity: Not on file  Transportation Needs: Not on file  Physical Activity: Not on file  Stress: Not on file  Social Connections: Not on file   SDOH:  SDOH Screenings   Alcohol Screen: Low Risk    Last Alcohol Screening Score (AUDIT): 0  Depression (PHQ2-9): Not on file  Financial Resource Strain: Not on file  Food Insecurity: Not on file  Housing: Not on file  Physical Activity: Not on file  Social Connections: Not on file  Stress: Not on file  Tobacco Use: High Risk   Smoking Tobacco Use: Every Day   Smokeless Tobacco Use: Never   Passive Exposure: Not on file  Transportation Needs: Not on file    Tobacco Cessation:  N/A, patient does not currently use tobacco products  Current Medications:  Current Facility-Administered Medications  Medication Dose Route Frequency Provider Last Rate Last Admin   acetaminophen (TYLENOL) tablet 650 mg  650 mg Oral Q6H PRN Evette Georges, NP       alum & mag hydroxide-simeth (MAALOX/MYLANTA) 200-200-20 MG/5ML suspension 30 mL  30 mL Oral Q4H PRN Evette Georges, NP       magnesium hydroxide (MILK OF MAGNESIA) suspension 30 mL  30 mL Oral Daily PRN Evette Georges, NP       traZODone (DESYREL) tablet 50 mg  50 mg Oral QHS PRN Evette Georges, NP       Current Outpatient Medications  Medication Sig Dispense Refill   paliperidone (INVEGA SUSTENNA) 234 MG/1.5ML SUSY injection Inject 234 mg into the muscle once for 1 dose. 1.5 mL 0    PTA Medications: (Not in a hospital  admission)   Musculoskeletal  Strength & Muscle Tone: within normal limits Gait & Station: normal Patient leans: N/A  Psychiatric Specialty Exam  Presentation  General Appearance: Disheveled  Eye Contact:Minimal  Speech:Clear and Coherent; Normal Rate  Speech Volume:Normal  Handedness:  Mood and Affect  Mood:Depressed  Affect:Flat  Thought Process  Thought Processes:Coherent; Goal Directed; Linear  Descriptions of Associations:Intact  Orientation:Full (Time, Place and Person)  Thought Content:Logical  Diagnosis of Schizophrenia or Schizoaffective disorder in past: Yes  Duration of Psychotic Symptoms: Greater than six months   Hallucinations:Hallucinations: Auditory Description of Auditory Hallucinations: hears voices, states he does not know what they are saying  Ideas of Reference:None  Suicidal Thoughts:Suicidal Thoughts: No  Homicidal Thoughts:Homicidal Thoughts: No  Sensorium  Memory:Immediate Good  Judgment:Intact  Insight:Shallow  Executive Functions  Concentration:Fair  Attention Span:Fair  [place]  Psychomotor Activity  Psychomotor Activity:Psychomotor Activity: Normal  Assets  Assets:Desire for Improvement; Armed forces logistics/support/administrative officer; Financial Resources/Insurance  Sleep  Sleep:Sleep: Fair  Nutritional Assessment (For OBS and FBC admissions only) Has the patient had a weight loss or gain of 10 pounds or more in the last 3 months?: No Has the patient had a decrease in food intake/or appetite?: No Does the patient have dental problems?: No Does the patient have eating habits or behaviors that may be indicators of an eating disorder including binging or inducing vomiting?: No Has the patient recently lost weight without trying?: 0 Has the patient been eating poorly because of a decreased appetite?: 0 Malnutrition Screening Tool Score: 0   Physical Exam  Physical Exam Cardiovascular:      Rate and Rhythm: Normal rate.  Pulmonary:     Effort: Pulmonary effort is normal.  Neurological:     Mental Status: He is alert and oriented to person, place, and time.  Psychiatric:        Attention and Perception: Attention normal. He perceives auditory hallucinations.        Mood and Affect: Mood is depressed. Affect is flat.        Speech: Speech normal.        Behavior: Behavior normal. Behavior is cooperative.   Review of Systems  Constitutional:  Negative for chills and fever.  Respiratory:  Negative for shortness of breath.   Cardiovascular:  Negative for chest pain and palpitations.  Gastrointestinal:  Negative for abdominal pain.  Psychiatric/Behavioral:  Positive for depression, hallucinations and substance abuse.   Blood pressure 119/69, pulse 72, temperature 97.6 F (36.4 C), temperature source Oral, resp. rate 20, SpO2 99 %. There is no height or weight on file to calculate BMI.  Demographic Factors:  Male, Adolescent or young adult, Low socioeconomic status, Living alone, and Unemployed  Loss Factors: NA  Historical Factors: NA  Risk Reduction Factors:   NA  Continued Clinical Symptoms:  Previous Psychiatric Diagnoses and Treatments  Cognitive Features That Contribute To Risk:  None    Suicide Risk:  Mild:  Suicidal ideation of limited frequency, intensity, duration, and specificity.  There are no identifiable plans, no associated intent, mild dysphoria and related symptoms, good self-control (both objective and subjective assessment), few other risk factors, and identifiable protective factors, including available and accessible social support.  Plan Of Care/Follow-up recommendations:  Follow up with outpatient resources provided  Disposition:  Discharge  Tharon Aquas, NP 05/30/2022, 9:30 AM

## 2022-05-30 NOTE — ED Notes (Signed)
Pt A&O x 4, presents with suicidal ideations, plan to overdose on street drugs. Reports SI x 1 mos. Denies HI ,Visual & auditory hallucinations noted.  Monitoring for safety.

## 2022-05-30 NOTE — ED Notes (Signed)
Discharge instructions provided and Pt stated understanding. Pt alert, orient and ambulatory prior to d/c from facility. Personal belongings returned from locker number 11. Pt escorted to the front lobby to d/c from facility. Pt refused to go upstairs for injection. Safety maintained.
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
## 2022-05-30 NOTE — Progress Notes (Signed)
   05/30/22 0143  [place] Triage Screening (Walk-ins at [facility] only)  How Did You Hear About [place]? Self  What Is the Reason for Your Visit/Call Today? Tyler White is a 26 year old male presenting as a voluntary walk-in to [facility] due to [facility] with plan to overdose on "street drugs". Patient denied HI. Patient reported onset of SI was approx 1 month ago. Patient reported main stressors include "being homeless and living on the street", financial and no support system. Patient reported last time taking his injection shot was 04/2022. Patient reported visual hallucinations of "just me" and auditory hallucinations of "hearing people", no additional information given. Patient reported worsening depressive symptoms. Patient was cooperative during assessment.  How Long Has This Been Causing You Problems? 1 wk - 1 month  Have You Recently Had Any Thoughts About Hurting Yourself? Yes  How long ago did you have thoughts about hurting yourself? today  Are You Planning to [place] At This time? Yes  Have you Recently Had Thoughts About Hurting Someone [name]? No  Are You Planning To Harm Someone At This Time? No  Are you currently experiencing any auditory, visual or other hallucinations? Yes  Please explain the hallucinations you are currently experiencing: "hearing people", "just me"  Have You Used Any Alcohol or Drugs in the Past 24 Hours? No  How long ago did you use Drugs or Alcohol? denied  What Did You Use and How Much? denied  Do you have any current medical co-morbidities that require immediate attention? No  Clinician description of patient physical appearance/behavior: appropriate / guarded  What Do You Feel Would Help You the Most Today? Treatment for Depression or other mood problem;Financial Resources;Housing Assistance;Food Assistance  If access to [facility] was not available, would you have sought care in the Emergency Department? Yes   Determination of Need Urgent (48 hours)  Options For Referral Outpatient Therapy;Medication Management

## 2022-05-30 NOTE — ED Notes (Signed)
Pt currently resting on pull out chair/bed with his eyes closed. No s&s of distress observed. Safety maintained and will continue to monitor.

## 2022-05-30 NOTE — ED Notes (Signed)
Pt requested another muffin and more cereal. Explained to Pt that we only give out one muffin and two cereals at breakfast. Banana also given.

## 2022-05-30 NOTE — Discharge Instructions (Addendum)
Homeless Shelter List:  Regulatory affairs officer ([place]) [place], [place] Phone: [phone]  [facility] [address], [place], [place] [address] Phone: [phone]  [facility] (Women only) [address]Beryl Meager [place], [place] [address] Phone: [place]. [place], [place] [address] Phone: [phone]  [place]: [phone]. [address] [place], [place] [address] Phone: [phone]  [facility] [address] [place], [place] [address] (Check in at 6:00PM for placement at a local shelter) Phone: [phone]

## 2022-06-04 ENCOUNTER — Encounter (HOSPITAL_COMMUNITY): Payer: Self-pay

## 2022-06-04 ENCOUNTER — Emergency Department (HOSPITAL_COMMUNITY)
Admission: EM | Admit: 2022-06-04 | Discharge: 2022-06-05 | Disposition: A | Discharge status: Home / Self Care | Payer: Medicaid Other | Attending: Emergency Medicine | Admitting: Emergency Medicine

## 2022-06-04 ENCOUNTER — Other Ambulatory Visit: Payer: Self-pay

## 2022-06-04 DIAGNOSIS — T50901A Poisoning by unspecified drugs, medicaments and biological substances, accidental (unintentional), initial encounter: Secondary | ICD-10-CM | POA: Diagnosis present

## 2022-06-04 DIAGNOSIS — X58XXXA Exposure to other specified factors, initial encounter: Secondary | ICD-10-CM | POA: Diagnosis not present

## 2022-06-04 DIAGNOSIS — T40411A Poisoning by fentanyl or fentanyl analogs, accidental (unintentional), initial encounter: Secondary | ICD-10-CM | POA: Diagnosis not present

## 2022-06-04 NOTE — ED Notes (Signed)
Patient removed self from the monitor and started walking back and forth in the room. Pt walked out to desk and asked for a drink - cola provided.

## 2022-06-04 NOTE — Discharge Instructions (Addendum)
Substance Abuse Treatment Programs ° °Intensive Outpatient Programs °[facility]     °[address]      °[place], [place]                   °[phone]      ° °[facility] °[address] °[place], [place] °336-379-7146 ° °[facility]     °(Inpatient and outpatient)     °[address]           °336-832-9800   ° °[facility] °336-288-1484 (Suboxone and Methadone) ° °[address]      °[place], [place] [address]      °336-882-2125      ° °[address] °[place], [place] °852-3033 ° °[facility] (Outpatient/Inpatient, Chemical)    °(insurance only) [phone]      °       °[facility] (Groups & Residential) °[place], [place] °336-389-1413 ° °   °[facility]     °[address]     °[place], [place]      °336-389-1413      ° °[facility] (for caregivers and family) °[address] °[place], [place] °336-299-4655 ° ° ° ° ° °Residential Treatment Programs °[facility]      °[address], [place], [place] [address]  °([phone]) [phone]      ° °[facility] °[address] [place], [place] [address] °[phone] ° °[facility]        °336-248-8914      ° °[facility] °1-800-659-3381 ° °[facility]             °[address]                                         °[place], [place]                                                °877-615-2722 or 336-784-9470                              ° °[facility] [place] °[address] °[place], [address] °1.877.941.8954 ° °[facility]    °[address]      °[place], [place]     °[phone]      ° °[facility] °[address] °[place], [place] °336-285-9073 ° °[facility]   °[address]     °[place], [place] [address]     °336-899-1550      °Admissions: 8am-3pm M-F ° °[facility] °[address] °[place],  [place] °336-227-7417 ° °[facility]: Residential Program (90 Days)   °[place], [place]      °336-725-8389 or 800-758-6077    ° °[facility] °[place], [place] °(Walk in Hours over the weekend or by referral) ° °[facility] °[address], [place], [place] [address] °(336) [phone] ° °Crisis Mobile: [facility]:  1-877-626-1772 (for crisis response 24 hours a day) °[facility] Hotline:      1-800-256-2452 °Outpatient Psychiatry and Counseling ° °[facility]: Mobile Crisis   Management 24 hours:  1-877-626-1772 ° °[facility] sliding scale fee and walk in schedule: M-F 8am-12pm/1pm-3pm °[address]  °[place], [place] [address] °[phone] ° °[facility] °[address] °[place], [place] [address] °336-703-9650 ° °[facility] ([facility])- new patient walk-in appointments available Monday - Friday 8am -3pm.          °[address] °[place], [place] [address] °336-676-6840 or crisis line- 336-676-6905 ° °[facility] Intensive Outpatient Therapy Program °[address] °[place], [place] [address] °336-832-9804 ° °[facility]                  °Crisis Services      °336.641.4993      °[address]     °[place], [place] [address]                ° °[facility]   °[facility] °[phone] °[address] °[place], [place] [address] ° ° °[facility]          °[address]  °[place], [place] [address]       °(336) [phone] ° °[facility] °[address] °[place], [place] [address] °336-292-1510 ° °[facility]    °[address]    °[place], [place] [address]     °336-632-3505      ° °Parish McKinney, [place]     °[address]     °[place] [address]     °336-282-1251     °  °[facility] °[address] °[place] [address] ° °[facility]     °[address]     °[place], [place]      °336-542-2076      ° °[facility] °Shamsher Ahluwalia, MD °[address] °[place], [place] [address] °336-420-9558 ° °[facility]     °[address]     °[place], [place] [address]     °336-274-1237      ° °[facility] °[address] °[place], [place] [address] °336-854-4450 °Resources for Temporary Residential Assistance/Crisis Centers ° °DAY CENTERS °[facility] °M-F 8am-3pm   °[address] [place], [place] [address]   336-332-0824 °Services include: laundry, barbering, support groups, case management, phone  & computer access, showers, AA/NA mtgs, mental health/substance abuse nurse, job skills class, disability information, VA assistance, spiritual classes, etc.  ° °HOMELESS SHELTERS ° °[facility]     °[facility]   °[address], [place]     °336.271.5959       °       °[facility] (women and children)       °[address] °[place], [place] [address] °336-275-0820 °[email or web address] for application and process °Application Required ° °[facility]   °[address]    °[place] [address]     °336.886.4922       °             °[facility] °[address] °, [place] [address] °336.273.5572 °336-235-0363(schedule application appt.) °Application Required ° °[facility] (women only)    °[address]     °[place],  [address]     °336-884-1039      °  Intake starts 6pm daily °Need valid ID, SSC, & Police report °[facility] [place] °[address] °[place], [place] °336-881-5420 °Application Required ° °[facility] (men only)     °[address]      °[place], [place]     °[phone]      ° °[facility] °(Pregnant women only) °[address] °[place], [place] °336-275-0206 ° °[facility]      °[address]      °[place], [place] [address]     °336-722-9951      °       °[facility] °[address] °[place], [place] °336-723-1848 °90 day commitment/SA/Application process ° °[facility](men only)     °[address]     °[place], [place]     °336-748-1962       °Check-in at 7pm     °       °[facility] °[address] °[place], [place] [address] °336-248-6684 °Men/Women/Women and Children must be there by 7 pm ° °[facility] °[place],  °336-722-8721                ° °

## 2022-06-04 NOTE — ED Provider Notes (Signed)
[facility] DEPT Provider Note   CSN: [id number] Arrival date & time: 06/04/22  2045     History  Chief Complaint  Patient presents with   Drug Overdose    accidental    Tyler White is a 26 y.o. male.  HPI     26 year old male comes in with chief complaint of overdose. Patient found in his friend's home by EMS after accidental overdose.  Patient indicates that he was trying to smoke crack cocaine.  He suspects it was laced with fentanyl.  Per EMS, patient's friend called EMS as patient was unresponsive and not breathing.  They assisted patient's ventilation with BVM and gave him 4 mg of intranasal Narcan.  Patient became responsive with Narcan.  He has had vomiting.  Patient received IM Zofran as well, and now patient states that he does not have nausea anymore.  He admits to similar episode with crack use in the past.  Patient denies any suicidal thoughts.  Home Medications Prior to Admission medications   Medication Sig Start Date End Date Taking? Authorizing Provider  paliperidone (INVEGA SUSTENNA) 234 MG/1.5ML SUSY injection Inject 234 mg into the muscle once for 1 dose. 04/16/22 05/03/22  Tharon Aquas, NP  gabapentin (NEURONTIN) 400 MG capsule Take 1 capsule (400 mg total) by mouth 3 (three) times daily. Patient not taking: Reported on 06/09/2021 04/30/21 06/10/21  Ethelene Hal, NP      Allergies    Patient has no known allergies.    Review of Systems   Review of Systems  All other systems reviewed and are negative.  Physical Exam Updated Vital Signs BP 129/70 (BP Location: Right Arm)   Pulse (!) 53   Temp 98 F (36.7 C) (Oral)   Resp 18   Ht '5\' 8"'$  (1.727 m)   Wt 55 kg   SpO2 100%   BMI 18.44 kg/m  Physical Exam Vitals and nursing note reviewed.  Constitutional:      Appearance: He is well-developed.  HENT:     Head: Atraumatic.  Eyes:     Extraocular Movements: Extraocular movements intact.     Pupils:  Pupils are equal, round, and reactive to light.  Cardiovascular:     Rate and Rhythm: Normal rate.  Pulmonary:     Effort: Pulmonary effort is normal.  Musculoskeletal:     Cervical back: Neck supple.  Skin:    General: Skin is warm.  Neurological:     Mental Status: He is alert and oriented to person, place, and time.    ED Results / Procedures / Treatments   Labs (all labs ordered are listed, but only abnormal results are displayed) Labs Reviewed - No data to display  EKG EKG Interpretation  Date/Time:  Tuesday June 04 2022 20:56:56 EDT Ventricular Rate:  71 PR Interval:  143 QRS Duration: 77 QT Interval:  367 QTC Calculation: 399 R Axis:   132 Text Interpretation: Right and left arm electrode reversal, interpretation assumes no reversal Sinus or ectopic atrial rhythm Right axis deviation Abnormal T, consider ischemia, lateral leads No significant change since last tracing Confirmed by Varney Biles [id number]) on 06/04/2022 10:38:22 PM  Radiology No results found.  Procedures Procedures    Medications Ordered in ED Medications - No data to display  ED Course/ Medical Decision Making/ A&P Clinical Course as of 06/04/22 2340  Tue Jun 04, 2022  2339 Patient reassessed.  Doing well.  [AN]  2340 Patient is stable for discharge, return  precautions discussed.  Be heart and other outpatient resources provided. [AN]    Clinical Course User Index [AN] Varney Biles, MD                           Medical Decision Making  26 year old male comes in with chief complaint of accidental overdose.  Patient states that he was trying to use crack cocaine, when he became unresponsive.  His friend called EMS.  Per EMS note, patient was unresponsive and required ventilation support.  He received intranasal Narcan to which patient responded immediately.  Currently patient is feeling better.  He does not have any nausea anymore.  He denies any chest pain, shortness of breath.  He  denies any abdominal pain.  He denies any SI, HI.  Patient is apprehensive about any work-up in the ED. I do not think any blood work-up is indicated.  His O2 sats are 100% right now, no focal rhonchi or rales appreciated.  X-ray considered, but I do not think it will be very helpful in this setting.  Patient to be monitored on cardiac telemetry for now.  Final Clinical Impression(s) / ED Diagnoses Final diagnoses:  Accidental fentanyl overdose, initial encounter [facility])    Rx / DC Orders ED Discharge Orders     None         Varney Biles, MD 06/04/22 2340

## 2022-06-04 NOTE — ED Triage Notes (Signed)
Pt from friends house by EMS c/o accidental OD. PD/EMS were called out for OD. Pt was found unresponsive and not breathing. Respirations assisted with BVM. Pt received 4 mg IN narcan and was responsive by EMS arrival. Pt vomited after he woke up. Pt received '4mg'$  IM zofran due to refusing IV placement. States nausea is improved upon arrival. Pt A&Ox4, VSS. Pt admits to smoking fentanyl tonight - states that he has a hx of occasional crack use as well.

## 2022-06-05 ENCOUNTER — Other Ambulatory Visit (HOSPITAL_COMMUNITY)
Admission: EM | Admit: 2022-06-05 | Discharge: 2022-06-07 | Disposition: A | Discharge status: Home / Self Care | Payer: Medicaid Other | Attending: Psychiatry | Admitting: Psychiatry

## 2022-06-05 ENCOUNTER — Encounter (HOSPITAL_COMMUNITY): Payer: Self-pay | Admitting: Registered Nurse

## 2022-06-05 DIAGNOSIS — F1914 Other psychoactive substance abuse with psychoactive substance-induced mood disorder: Secondary | ICD-10-CM | POA: Insufficient documentation

## 2022-06-05 DIAGNOSIS — Z5900 Homelessness unspecified: Secondary | ICD-10-CM | POA: Diagnosis not present

## 2022-06-05 DIAGNOSIS — F1414 Cocaine abuse with cocaine-induced mood disorder: Secondary | ICD-10-CM | POA: Diagnosis present

## 2022-06-05 DIAGNOSIS — F1910 Other psychoactive substance abuse, uncomplicated: Secondary | ICD-10-CM | POA: Diagnosis present

## 2022-06-05 DIAGNOSIS — F250 Schizoaffective disorder, bipolar type: Secondary | ICD-10-CM

## 2022-06-05 DIAGNOSIS — Z20822 Contact with and (suspected) exposure to covid-19: Secondary | ICD-10-CM | POA: Diagnosis not present

## 2022-06-05 DIAGNOSIS — F1994 Other psychoactive substance use, unspecified with psychoactive substance-induced mood disorder: Secondary | ICD-10-CM

## 2022-06-05 LAB — POCT URINE DRUG SCREEN - MANUAL ENTRY (I-SCREEN)
POC Amphetamine UR: NOT DETECTED
POC Buprenorphine (BUP): NOT DETECTED
POC Cocaine UR: POSITIVE — AB
POC Marijuana UR: POSITIVE — AB
POC Methadone UR: NOT DETECTED
POC Methamphetamine UR: NOT DETECTED
POC Morphine: NOT DETECTED
POC Oxazepam (BZO): NOT DETECTED
POC Oxycodone UR: POSITIVE — AB
POC Secobarbital (BAR): NOT DETECTED

## 2022-06-05 LAB — URINALYSIS, ROUTINE W REFLEX MICROSCOPIC
Bilirubin Urine: NEGATIVE
Glucose, UA: NEGATIVE mg/dL
Hgb urine dipstick: NEGATIVE
Ketones, ur: NEGATIVE mg/dL
Nitrite: NEGATIVE
Protein, ur: NEGATIVE mg/dL
Specific Gravity, Urine: 1.012 (ref 1.005–1.030)
pH: 6 (ref 5.0–8.0)

## 2022-06-05 LAB — COMPREHENSIVE METABOLIC PANEL
ALT: 28 U/L (ref 0–44)
AST: 41 U/L (ref 15–41)
Albumin: 3.8 g/dL (ref 3.5–5.0)
Alkaline Phosphatase: 48 U/L (ref 38–126)
Anion gap: 9 (ref 5–15)
BUN: 15 mg/dL (ref 6–20)
CO2: 27 mmol/L (ref 22–32)
Calcium: 9.6 mg/dL (ref 8.9–10.3)
Chloride: 100 mmol/L (ref 98–111)
Creatinine, Ser: 1.01 mg/dL (ref 0.61–1.24)
GFR, Estimated: >60 mL/min (ref >60)
Glucose, Bld: 90 mg/dL (ref 70–99)
Potassium: 3.6 mmol/L (ref 3.5–5.1)
Sodium: 136 mmol/L (ref 135–145)
Total Bilirubin: 0.5 mg/dL (ref 0.3–1.2)
Total Protein: 6.3 g/dL — ABNORMAL LOW (ref 6.5–8.1)

## 2022-06-05 LAB — CBC WITH DIFFERENTIAL/PLATELET
Abs Immature Granulocytes: 0.02 10*3/uL (ref 0.00–0.07)
Basophils Absolute: 0.1 10*3/uL (ref 0.0–0.1)
Basophils Relative: 1 %
Eosinophils Absolute: 0.2 10*3/uL (ref 0.0–0.5)
Eosinophils Relative: 2 %
HCT: 42.3 % (ref 39.0–52.0)
Hemoglobin: 14.5 g/dL (ref 13.0–17.0)
Immature Granulocytes: 0 %
Lymphocytes Relative: 23 %
Lymphs Abs: 2.2 10*3/uL (ref 0.7–4.0)
MCH: 28.5 pg (ref 26.0–34.0)
MCHC: 34.3 g/dL (ref 30.0–36.0)
MCV: 83.3 fL (ref 80.0–100.0)
Monocytes Absolute: 0.7 10*3/uL (ref 0.1–1.0)
Monocytes Relative: 7 %
Neutro Abs: 6.5 10*3/uL (ref 1.7–7.7)
Neutrophils Relative %: 67 %
Platelets: 323 10*3/uL (ref 150–400)
RBC: 5.08 MIL/uL (ref 4.22–5.81)
RDW: 13.6 % (ref 11.5–15.5)
WBC: 9.6 10*3/uL (ref 4.0–10.5)
nRBC: 0 % (ref 0.0–0.2)

## 2022-06-05 LAB — ETHANOL: Alcohol, Ethyl (B): <10 mg/dL (ref <10)

## 2022-06-05 LAB — RESP PANEL BY RT-PCR (FLU A&B, COVID) ARPGX2
Influenza A by PCR: NEGATIVE
Influenza B by PCR: NEGATIVE
SARS Coronavirus 2 by RT PCR: NEGATIVE

## 2022-06-05 MED ORDER — PALIPERIDONE PALMITATE ER 234 MG/1.5ML IM SUSY
234.0000 mg | PREFILLED_SYRINGE | Freq: Once | INTRAMUSCULAR | Status: AC
  Administered 2022-06-05: 234 mg via INTRAMUSCULAR

## 2022-06-05 MED ORDER — MAGNESIUM HYDROXIDE 400 MG/5ML PO SUSP: 30.0000 mL | Freq: Every day | ORAL | Status: DC | PRN

## 2022-06-05 MED ORDER — ALUM & MAG HYDROXIDE-SIMETH 200-200-20 MG/5ML PO SUSP: 30.0000 mL | ORAL | Status: DC | PRN

## 2022-06-05 MED ORDER — TRAZODONE HCL 50 MG PO TABS: 50.0000 mg | ORAL_TABLET | Freq: Every evening | ORAL | Status: DC | PRN

## 2022-06-05 MED ORDER — ACETAMINOPHEN 325 MG PO TABS: 650.0000 mg | ORAL_TABLET | Freq: Four times a day (QID) | ORAL | Status: DC | PRN

## 2022-06-05 MED ORDER — HYDROXYZINE HCL 25 MG PO TABS
25.0000 mg | ORAL_TABLET | Freq: Three times a day (TID) | ORAL | Status: DC | PRN
  Administered 2022-06-06: 25 mg via ORAL
  Filled 2022-06-05: qty 1

## 2022-06-05 NOTE — ED Provider Notes (Signed)
Facility Based Crisis Admission H&P  Date: 06/05/22 Patient Name: Tyler White MRN: [id number] Chief Complaint:  Chief Complaint  Patient presents with   Addiction Problem      Diagnoses:  Final diagnoses:  Polysubstance abuse ([place])  Substance induced mood disorder ([place])  Homelessness    HPI: Tyler White, 26 y.o., male patient presents to [facility] voluntarily as walk in requesting services for substance use rehab.  Patient discharged from ED on   Patient seen face to face by this provider, consulted with Dr. Ernie Hew; and chart reviewed on 06/05/22.  On evaluation Tyler White reports he came to urgent care because he really wants to get off of drugs and wants to go to rehab.  States he was in the hospital yesterday related to someone mixing fentanyl with cocaine.  He denies suicidal/self-harm/homicidal ideation, psychosis, and paranoia.  Report he is homeless and has no support.  Patient states that he hasn't been compliant with medications and hasn't followed with resources for outpatient psychiatric services.  He does agree to take his [place] injection today.   During evaluation Tyler White is alert/oriented x 4; calm/cooperative; and mood is congruent with affect.  He does not appear to be responding to internal/external stimuli or delusional thoughts.  Patient denies suicidal/self-harm/homicidal ideation, psychosis, and paranoia.  Patient answered question appropriately.   Patient admitted to facility base crisis unit.    PHQ 2-9:  [facility] ED from 06/05/2022 in [facility] ED from 03/11/2021 in [facility]  Thoughts that you would be better off dead, or of hurting yourself in some way Not at all Several days  PHQ-9 Total Score 4 17       Flowsheet Row ED from 06/04/2022 in [facility] DEPT ED from 05/30/2022 in [facility] ED  from 05/23/2022 in [facility] Risk High Risk No Risk        Total Time spent with patient: 30 minutes  Musculoskeletal  Strength & Muscle Tone: within normal limits Gait & Station: normal Patient leans: N/A  Psychiatric Specialty Exam  Presentation General Appearance: Disheveled  Eye Contact:Good  Speech:Clear and Coherent; Normal Rate  Speech Volume:Normal  Handedness:Right   Mood and Affect  Mood:Dysphoric  Affect:Congruent   Thought Process  Thought Processes:Coherent; Goal Directed  Descriptions of Associations:Intact  Orientation:Full (Time, Place and Person)  Thought Content:Logical  Diagnosis of Schizophrenia or Schizoaffective disorder in past: Yes  Duration of Psychotic Symptoms: Greater than six months  Hallucinations:Hallucinations: None  Ideas of Reference:None  Suicidal Thoughts:Suicidal Thoughts: No  Homicidal Thoughts:Homicidal Thoughts: No   Sensorium  Memory:Immediate Good; Recent Good; Remote Good  Judgment:Intact  Insight:Fair; Present   Executive Functions  Concentration:Good  Attention Span:Good  Tyler White of Knowledge:Good  Language:Good   Psychomotor Activity  Psychomotor Activity:Psychomotor Activity: Normal   Assets  Assets:Communication Skills; Desire for Improvement; Resilience   Sleep  Sleep:Sleep: Good   Nutritional Assessment (For OBS and FBC admissions only) Has the patient had a weight loss or gain of 10 pounds or more in the last 3 months?: No Has the patient had a decrease in food intake/or appetite?: No Does the patient have dental problems?: No Does the patient have eating habits or behaviors that may be indicators of an eating disorder including binging or inducing vomiting?: No Has the patient recently lost weight without trying?: 0 Has the patient  been eating poorly because of a decreased appetite?: 0 Malnutrition  Screening Tool Score: 0    Physical Exam Vitals and nursing note reviewed. Exam conducted with a chaperone present.  Constitutional:      General: He is not in acute distress.    Appearance: Normal appearance. He is not ill-appearing.  Cardiovascular:     Rate and Rhythm: Normal rate.  Pulmonary:     Effort: Pulmonary effort is normal.  Musculoskeletal:        General: Normal range of motion.     Cervical back: Normal range of motion.  Skin:    General: Skin is warm and dry.  Neurological:     Mental Status: He is alert and oriented to person, place, and time.   ROS  Blood pressure 126/78, pulse 61, temperature 98 F (36.7 C), temperature source Oral, resp. rate 17, SpO2 99 %. There is no height or weight on file to calculate BMI.  Past Psychiatric History: See below   Is the patient at risk to self? No  Has the patient been a risk to self in the past 6 months? No .    Has the patient been a risk to self within the distant past? No   Is the patient a risk to others? No   Has the patient been a risk to others in the past 6 months? No   Has the patient been a risk to others within the distant past? No   Past Medical History:  Past Medical History:  Diagnosis Date   Hernia, inguinal, right    Inguinal hernia    right   Psychiatric illness    Schizophrenia ([place])    History reviewed. No pertinent surgical history.  Family History:  Family History  Problem Relation Age of Onset   Psychiatric Illness Mother    Hypertension Sister     Social History:  Social History   Socioeconomic History   Marital status: Single    Spouse name: Not on file   Number of children: Not on file   Years of education: 10   Highest education level: Not on file  Occupational History   Not on file  Tobacco Use   Smoking status: Some Days    Packs/day: 0.50    Years: 5.00    Pack years: 2.50    Types: Cigarettes   Smokeless tobacco: Never  Vaping Use   Vaping Use: Never used   Substance and Sexual Activity   Alcohol use: Not Currently   Drug use: Not Currently    Types: Marijuana, Cocaine    Comment: crack/cocaine occasionally   Sexual activity: Yes    Birth control/protection: None  Other Topics Concern   Not on file  Social History Narrative   ** Merged History Encounter **       ** Merged History Encounter **       Social Determinants of Health   Financial Resource Strain: Not on file  Food Insecurity: Not on file  Transportation Needs: Not on file  Physical Activity: Not on file  Stress: Not on file  Social Connections: Not on file  Intimate Partner Violence: Not on file    SDOH:  SDOH Screenings   Alcohol Screen: Low Risk    Last Alcohol Screening Score (AUDIT): 0  Depression (PHQ2-9): Low Risk    PHQ-2 Score: 4  Financial Resource Strain: Not on file  Food Insecurity: Not on file  Housing: Not on file  Physical Activity: Not  on file  Social Connections: Not on file  Stress: Not on file  Tobacco Use: High Risk   Smoking Tobacco Use: Some Days   Smokeless Tobacco Use: Never   Passive Exposure: Not on file  Transportation Needs: Not on file    Last Labs:  Admission on 05/30/2022, Discharged on 05/30/2022  Component Date Value Ref Range Status   SARS Coronavirus 2 by RT PCR 05/30/2022 NEGATIVE  NEGATIVE Final   Comment: (NOTE) SARS-CoV-2 target nucleic acids are NOT DETECTED.  The SARS-CoV-2 RNA is generally detectable in upper respiratory specimens during the acute phase of infection. The lowest concentration of SARS-CoV-2 viral copies this assay can detect is 138 copies/mL. A negative result does not preclude SARS-Cov-2 infection and should not be used as the sole basis for treatment or other patient management decisions. A negative result may occur with  improper specimen collection/handling, submission of specimen other than nasopharyngeal swab, presence of viral mutation(s) within the areas targeted by this assay, and  inadequate number of viral copies(<138 copies/mL). A negative result must be combined with clinical observations, patient history, and epidemiological information. The expected result is Negative.  Fact Sheet for Patients:  EntrepreneurPulse.com.au  Fact Sheet for Healthcare Providers:  IncredibleEmployment.be  This test is no                          t yet approved or cleared by the [place] FDA and  has been authorized for detection and/or diagnosis of SARS-CoV-2 by FDA under an Emergency Use Authorization (EUA). This EUA will remain  in effect (meaning this test can be used) for the duration of the COVID-19 declaration under Section 564(b)(1) of the Act, 21 U.S.C.section 360bbb-3(b)(1), unless the authorization is terminated  or revoked sooner.       Influenza A by PCR 05/30/2022 NEGATIVE  NEGATIVE Final   Influenza B by PCR 05/30/2022 NEGATIVE  NEGATIVE Final   Comment: (NOTE) The Xpert Xpress SARS-CoV-2/FLU/RSV plus assay is intended as an aid in the diagnosis of influenza from Nasopharyngeal swab specimens and should not be used as a sole basis for treatment. Nasal washings and aspirates are unacceptable for Xpert Xpress SARS-CoV-2/FLU/RSV testing.  Fact Sheet for Patients: EntrepreneurPulse.com.au  Fact Sheet for Healthcare Providers: IncredibleEmployment.be  This test is not yet approved or cleared by the [place] FDA and has been authorized for detection and/or diagnosis of SARS-CoV-2 by FDA under an Emergency Use Authorization (EUA). This EUA will remain in effect (meaning this test can be used) for the duration of the COVID-19 declaration under Section 564(b)(1) of the Act, 21 U.S.C. section 360bbb-3(b)(1), unless the authorization is terminated or revoked.  Performed at [facility], [place] [address], [place], [place] [address]    WBC 05/30/2022 7.8  4.0 - 10.5 K/uL Final    RBC 05/30/2022 4.90  4.22 - 5.81 MIL/uL Final   Hemoglobin 05/30/2022 13.7  13.0 - 17.0 g/dL Final   HCT 05/30/2022 40.9  39.0 - 52.0 % Final   MCV 05/30/2022 83.5  80.0 - 100.0 fL Final   MCH 05/30/2022 28.0  26.0 - 34.0 pg Final   MCHC 05/30/2022 33.5  30.0 - 36.0 g/dL Final   RDW 05/30/2022 13.7  11.5 - 15.5 % Final   Platelets 05/30/2022 313  150 - 400 K/uL Final   nRBC 05/30/2022 0.0  0.0 - 0.2 % Final   Neutrophils Relative % 05/30/2022 66  % Final  Neutro Abs 05/30/2022 5.2  1.7 - 7.7 K/uL Final   Lymphocytes Relative 05/30/2022 25  % Final   Lymphs Abs 05/30/2022 1.9  0.7 - 4.0 K/uL Final   Monocytes Relative 05/30/2022 6  % Final   Monocytes Absolute 05/30/2022 0.5  0.1 - 1.0 K/uL Final   Eosinophils Relative 05/30/2022 2  % Final   Eosinophils Absolute 05/30/2022 0.2  0.0 - 0.5 K/uL Final   Basophils Relative 05/30/2022 1  % Final   Basophils Absolute 05/30/2022 0.1  0.0 - 0.1 K/uL Final   Immature Granulocytes 05/30/2022 0  % Final   Abs Immature Granulocytes 05/30/2022 0.01  0.00 - 0.07 K/uL Final   Performed at [facility] [address] [place], [place] [address]   Sodium 05/30/2022 139  135 - 145 mmol/L Final   Potassium 05/30/2022 3.6  3.5 - 5.1 mmol/L Final   Chloride 05/30/2022 106  98 - 111 mmol/L Final   CO2 05/30/2022 26  22 - 32 mmol/L Final   Glucose, Bld 05/30/2022 98  70 - 99 mg/dL Final   Glucose reference range applies only to samples taken after fasting for at least 8 hours.   BUN 05/30/2022 13  6 - 20 mg/dL Final   Creatinine, Ser 05/30/2022 1.00  0.61 - 1.24 mg/dL Final   Calcium 05/30/2022 9.4  8.9 - 10.3 mg/dL Final   Total Protein 05/30/2022 7.0  6.5 - 8.1 g/dL Final   Albumin 05/30/2022 4.1  3.5 - 5.0 g/dL Final   AST 05/30/2022 36  15 - 41 U/L Final   ALT 05/30/2022 25  0 - 44 U/L Final   Alkaline Phosphatase 05/30/2022 46  38 - 126 U/L Final   Total Bilirubin 05/30/2022 0.7  0.3 - 1.2 mg/dL Final   GFR, Estimated 05/30/2022 >60  >60  mL/min Final   Comment: (NOTE) Calculated using the CKD-EPI Creatinine Equation (2021)    Anion gap 05/30/2022 7  5 - 15 Final   Performed at [facility] [address] [place], [place] [address]   Hgb A1c MFr Bld 05/30/2022 5.2  4.8 - 5.6 % Final   Comment: (NOTE) Pre diabetes:          5.7%-6.4%  Diabetes:              >6.4%  Glycemic control for   <7.0% adults with diabetes    Mean Plasma Glucose 05/30/2022 102.54  mg/dL Final   Performed at [facility], [place] [address] [place], [place] [address]   Cholesterol 05/30/2022 188  0 - 200 mg/dL Final   Triglycerides 05/30/2022 48  <150 mg/dL Final   HDL 05/30/2022 88  >40 mg/dL Final   Total CHOL/HDL Ratio 05/30/2022 2.1  RATIO Final   VLDL 05/30/2022 10  0 - 40 mg/dL Final   LDL Cholesterol 05/30/2022 90  0 - 99 mg/dL Final   Comment:        Total Cholesterol/HDL:CHD Risk Coronary Heart Disease Risk Table                     Men   Women  1/2 Average Risk   3.4   3.3  Average Risk       5.0   4.4  2 X Average Risk   9.6   7.1  3 X Average Risk  23.4   11.0        Use the calculated Patient Ratio above and  the CHD Risk Table to determine the patient's CHD Risk.        ATP III CLASSIFICATION (LDL):  <100     mg/dL   Optimal  100-129  mg/dL   Near or Above                    Optimal  130-159  mg/dL   Borderline  160-189  mg/dL   High  >190     mg/dL   Very High Performed at [facility] [address], [place], [place] [address]    TSH 05/30/2022 0.753  0.350 - 4.500 uIU/mL Final   Comment: Performed by a 3rd Generation assay with a functional sensitivity of <=0.01 uIU/mL. Performed at [facility], [place] [address] [place], [place] [address]    POC Amphetamine UR 05/30/2022 Positive (A)  NONE DETECTED (Cut Off Level 1000 ng/mL) Final   POC Secobarbital (BAR) 05/30/2022 None Detected  NONE DETECTED (Cut Off Level 300 ng/mL) Final   POC Buprenorphine (BUP) 05/30/2022 None Detected  NONE  DETECTED (Cut Off Level 10 ng/mL) Final   POC Oxazepam (BZO) 05/30/2022 None Detected  NONE DETECTED (Cut Off Level 300 ng/mL) Final   POC Cocaine UR 05/30/2022 Positive (A)  NONE DETECTED (Cut Off Level 300 ng/mL) Final   POC Methamphetamine UR 05/30/2022 Positive (A)  NONE DETECTED (Cut Off Level 1000 ng/mL) Final   POC Morphine 05/30/2022 None Detected  NONE DETECTED (Cut Off Level 300 ng/mL) Final   POC Methadone UR 05/30/2022 None Detected  NONE DETECTED (Cut Off Level 300 ng/mL) Final   POC Oxycodone UR 05/30/2022 None Detected  NONE DETECTED (Cut Off Level 100 ng/mL) Final   POC Marijuana UR 05/30/2022 Positive (A)  NONE DETECTED (Cut Off Level 50 ng/mL) Final   SARSCOV2ONAVIRUS 2 AG 05/30/2022 NEGATIVE  NEGATIVE Final   Comment: (NOTE) SARS-CoV-2 antigen NOT DETECTED.   Negative results are presumptive.  Negative results do not preclude SARS-CoV-2 infection and should not be used as the sole basis for treatment or other patient management decisions, including infection  control decisions, particularly in the presence of clinical signs and  symptoms consistent with COVID-19, or in those who have been in contact with the virus.  Negative results must be combined with clinical observations, patient history, and epidemiological information. The expected result is Negative.  Fact Sheet for Patients: HandmadeRecipes.com.cy  Fact Sheet for Healthcare Providers: FuneralLife.at  This test is not yet approved or cleared by the [place] FDA and  has been authorized for detection and/or diagnosis of SARS-CoV-2 by FDA under an Emergency Use Authorization (EUA).  This EUA will remain in effect (meaning this test can be used) for the duration of  the COV                          ID-19 declaration under Section 564(b)(1) of the Act, 21 U.S.C. section 360bbb-3(b)(1), unless the authorization is terminated or revoked sooner.    Admission on  05/21/2022, Discharged on 05/21/2022  Component Date Value Ref Range Status   Glucose-Capillary 05/21/2022 105 (H)  70 - 99 mg/dL Final   Glucose reference range applies only to samples taken after fasting for at least 8 hours.  Admission on 05/20/2022, Discharged on 05/21/2022  Component Date Value Ref Range Status   WBC 05/20/2022 10.8 (H)  4.0 - 10.5 K/uL Final   RBC 05/20/2022 4.79  4.22 - 5.81 MIL/uL Final  Hemoglobin 05/20/2022 13.4  13.0 - 17.0 g/dL Final   HCT 05/20/2022 40.1  39.0 - 52.0 % Final   MCV 05/20/2022 83.7  80.0 - 100.0 fL Final   MCH 05/20/2022 28.0  26.0 - 34.0 pg Final   MCHC 05/20/2022 33.4  30.0 - 36.0 g/dL Final   RDW 05/20/2022 13.6  11.5 - 15.5 % Final   Platelets 05/20/2022 299  150 - 400 K/uL Final   nRBC 05/20/2022 0.0  0.0 - 0.2 % Final   Neutrophils Relative % 05/20/2022 71  % Final   Neutro Abs 05/20/2022 7.7  1.7 - 7.7 K/uL Final   Lymphocytes Relative 05/20/2022 19  % Final   Lymphs Abs 05/20/2022 2.1  0.7 - 4.0 K/uL Final   Monocytes Relative 05/20/2022 8  % Final   Monocytes Absolute 05/20/2022 0.8  0.1 - 1.0 K/uL Final   Eosinophils Relative 05/20/2022 1  % Final   Eosinophils Absolute 05/20/2022 0.1  0.0 - 0.5 K/uL Final   Basophils Relative 05/20/2022 1  % Final   Basophils Absolute 05/20/2022 0.1  0.0 - 0.1 K/uL Final   Immature Granulocytes 05/20/2022 0  % Final   Abs Immature Granulocytes 05/20/2022 0.03  0.00 - 0.07 K/uL Final   Performed at [facility], [place] [address], [place], [place] [address]   Sodium 05/20/2022 134 (L)  135 - 145 mmol/L Final   Potassium 05/20/2022 3.6  3.5 - 5.1 mmol/L Final   Chloride 05/20/2022 104  98 - 111 mmol/L Final   CO2 05/20/2022 21 (L)  22 - 32 mmol/L Final   Glucose, Bld 05/20/2022 213 (H)  70 - 99 mg/dL Final   Glucose reference range applies only to samples taken after fasting for at least 8 hours.   BUN 05/20/2022 19  6 - 20 mg/dL Final   Creatinine, Ser 05/20/2022 1.18  0.61 - 1.24 mg/dL  Final   Calcium 05/20/2022 9.0  8.9 - 10.3 mg/dL Final   Total Protein 05/20/2022 6.8  6.5 - 8.1 g/dL Final   Albumin 05/20/2022 4.1  3.5 - 5.0 g/dL Final   AST 05/20/2022 40  15 - 41 U/L Final   ALT 05/20/2022 25  0 - 44 U/L Final   Alkaline Phosphatase 05/20/2022 45  38 - 126 U/L Final   Total Bilirubin 05/20/2022 0.7  0.3 - 1.2 mg/dL Final   GFR, Estimated 05/20/2022 >60  >60 mL/min Final   Comment: (NOTE) Calculated using the CKD-EPI Creatinine Equation (2021)    Anion gap 05/20/2022 9  5 - 15 Final   Performed at [facility] [address] [place], [place] [address]   Alcohol, Ethyl (B) 05/20/2022 <10  <10 mg/dL Final   Comment: (NOTE) Lowest detectable limit for serum alcohol is 10 mg/dL.  For medical purposes only. Performed at [facility], [place] [address], [place], [place] [address]    Salicylate Lvl 91/47/8295 <7.0 (L)  7.0 - 30.0 mg/dL Final   Performed at [facility] [address], [place], [place] [address]   Acetaminophen (Tylenol), Serum 05/20/2022 <10 (L)  10 - 30 ug/mL Final   Comment: (NOTE) Therapeutic concentrations vary significantly. A range of 10-30 ug/mL  may be an effective concentration for many patients. However, some  are best treated at concentrations outside of this range. Acetaminophen concentrations >150 ug/mL at 4 hours after ingestion  and >50 ug/mL at 12 hours after ingestion are often associated with  toxic  reactions.  Performed at [facility], [place] [address] [place], [place] [address]   Admission on 05/14/2022, Discharged on 05/14/2022  Component Date Value Ref Range Status   Sodium 05/14/2022 139  135 - 145 mmol/L Final   Potassium 05/14/2022 3.8  3.5 - 5.1 mmol/L Final   Chloride 05/14/2022 107  98 - 111 mmol/L Final   CO2 05/14/2022 23  22 - 32 mmol/L Final   Glucose, Bld 05/14/2022 76  70 - 99 mg/dL Final   Glucose reference range applies only to samples taken after fasting for at least 8 hours.   BUN  05/14/2022 27 (H)  6 - 20 mg/dL Final   Creatinine, Ser 05/14/2022 0.99  0.61 - 1.24 mg/dL Final   Calcium 05/14/2022 9.0  8.9 - 10.3 mg/dL Final   GFR, Estimated 05/14/2022 >60  >60 mL/min Final   Comment: (NOTE) Calculated using the CKD-EPI Creatinine Equation (2021)    Anion gap 05/14/2022 9  5 - 15 Final   Performed at [facility], [place] [address] [place], [place] [address]   WBC 05/14/2022 7.7  4.0 - 10.5 K/uL Final   RBC 05/14/2022 4.50  4.22 - 5.81 MIL/uL Final   Hemoglobin 05/14/2022 13.0  13.0 - 17.0 g/dL Final   HCT 05/14/2022 38.0 (L)  39.0 - 52.0 % Final   MCV 05/14/2022 84.4  80.0 - 100.0 fL Final   MCH 05/14/2022 28.9  26.0 - 34.0 pg Final   MCHC 05/14/2022 34.2  30.0 - 36.0 g/dL Final   RDW 05/14/2022 13.8  11.5 - 15.5 % Final   Platelets 05/14/2022 285  150 - 400 K/uL Final   nRBC 05/14/2022 0.0  0.0 - 0.2 % Final   Performed at [facility], [place] [address] [place], [place] [address]   Color, Urine 05/14/2022 YELLOW  YELLOW Final   APPearance 05/14/2022 HAZY (A)  CLEAR Final   Specific Gravity, Urine 05/14/2022 1.029  1.005 - 1.030 Final   pH 05/14/2022 6.0  5.0 - 8.0 Final   Glucose, UA 05/14/2022 NEGATIVE  NEGATIVE mg/dL Final   Hgb urine dipstick 05/14/2022 NEGATIVE  NEGATIVE Final   Bilirubin Urine 05/14/2022 NEGATIVE  NEGATIVE Final   Ketones, ur 05/14/2022 NEGATIVE  NEGATIVE mg/dL Final   Protein, ur 05/14/2022 NEGATIVE  NEGATIVE mg/dL Final   Nitrite 05/14/2022 NEGATIVE  NEGATIVE Final   Leukocytes,Ua 05/14/2022 MODERATE (A)  NEGATIVE Final   RBC / HPF 05/14/2022 6-10  0 - 5 RBC/hpf Final   WBC, UA 05/14/2022 >50 (H)  0 - 5 WBC/hpf Final   Bacteria, UA 05/14/2022 NONE SEEN  NONE SEEN Final   Squamous Epithelial / LPF 05/14/2022 0-5  0 - 5 Final   Mucus 05/14/2022 PRESENT   Final   Performed at [facility], [place] [address], [place], [place] [address]   Glucose-Capillary 05/14/2022 107 (H)  70 - 99  mg/dL Final   Glucose reference range applies only to samples taken after fasting for at least 8 hours.   Troponin I (High Sensitivity) 05/14/2022 4  <18 ng/L Final   Comment: (NOTE) Elevated high sensitivity troponin I (hsTnI) values and significant  changes across serial measurements may suggest ACS but many other  chronic and acute conditions are known to elevate hsTnI results.  Refer to the "Links" section for chest pain algorithms and additional  guidance. Performed at [facility], [place] [address], [place],  [address]    Acetaminophen (  Tylenol), Serum 05/14/2022 <10 (L)  10 - 30 ug/mL Final   Performed at [facility], [place] [address] [place], [place] [address]   Salicylate Lvl 30/08/2329 <7.0 (L)  7.0 - 30.0 mg/dL Final   Performed at [facility] [address] [place], [place] [address]   Opiates 05/14/2022 NONE DETECTED  NONE DETECTED Final   Cocaine 05/14/2022 POSITIVE (A)  NONE DETECTED Final   Benzodiazepines 05/14/2022 NONE DETECTED  NONE DETECTED Final   Amphetamines 05/14/2022 NONE DETECTED  NONE DETECTED Final   Tetrahydrocannabinol 05/14/2022 POSITIVE (A)  NONE DETECTED Final   Barbiturates 05/14/2022 NONE DETECTED  NONE DETECTED Final   Comment: (NOTE) DRUG SCREEN FOR MEDICAL PURPOSES ONLY.  IF CONFIRMATION IS NEEDED FOR ANY PURPOSE, NOTIFY LAB WITHIN 5 DAYS.  LOWEST DETECTABLE LIMITS FOR URINE DRUG SCREEN Drug Class                     Cutoff (ng/mL) Amphetamine and metabolites    1000 Barbiturate and metabolites    200 Benzodiazepine                 633 Tricyclics and metabolites     300 Opiates and metabolites        300 Cocaine and metabolites        300 THC                            50 Performed at [facility], [place] [address] [place], [place] [address]   Admission on 05/04/2022, Discharged on 05/04/2022  Component Date Value Ref Range Status   Sodium 05/04/2022 142   135 - 145 mmol/L Final   Potassium 05/04/2022 3.9  3.5 - 5.1 mmol/L Final   Chloride 05/04/2022 109  98 - 111 mmol/L Final   CO2 05/04/2022 27  22 - 32 mmol/L Final   Glucose, Bld 05/04/2022 66 (L)  70 - 99 mg/dL Final   Glucose reference range applies only to samples taken after fasting for at least 8 hours.   BUN 05/04/2022 17  6 - 20 mg/dL Final   Creatinine, Ser 05/04/2022 1.22  0.61 - 1.24 mg/dL Final   Calcium 05/04/2022 9.2  8.9 - 10.3 mg/dL Final   GFR, Estimated 05/04/2022 >60  >60 mL/min Final   Comment: (NOTE) Calculated using the CKD-EPI Creatinine Equation (2021)    Anion gap 05/04/2022 6  5 - 15 Final   Performed at [facility], [place] [address], [place], [place] [address]   WBC 05/04/2022 10.0  4.0 - 10.5 K/uL Final   RBC 05/04/2022 4.75  4.22 - 5.81 MIL/uL Final   Hemoglobin 05/04/2022 13.5  13.0 - 17.0 g/dL Final   HCT 05/04/2022 40.6  39.0 - 52.0 % Final   MCV 05/04/2022 85.5  80.0 - 100.0 fL Final   MCH 05/04/2022 28.4  26.0 - 34.0 pg Final   MCHC 05/04/2022 33.3  30.0 - 36.0 g/dL Final   RDW 05/04/2022 14.0  11.5 - 15.5 % Final   Platelets 05/04/2022 325  150 - 400 K/uL Final   nRBC 05/04/2022 0.0  0.0 - 0.2 % Final   Performed at [facility] [address], [place], [place] [address]   Troponin I (High Sensitivity) 05/04/2022 4  <18 ng/L Final   Comment: (NOTE) Elevated high sensitivity troponin I (hsTnI) values and significant  changes across serial measurements  may suggest ACS but many other  chronic and acute conditions are known to elevate hsTnI results.  Refer to the "Links" section for chest pain algorithms and additional  guidance. Performed at [facility], [place] [address], [place], [place] [address]    Troponin I (High Sensitivity) 05/04/2022 4  <18 ng/L Final   Comment: (NOTE) Elevated high sensitivity troponin I (hsTnI) values and significant  changes across serial measurements may suggest ACS but many other  chronic and acute  conditions are known to elevate hsTnI results.  Refer to the "Links" section for chest pain algorithms and additional  guidance. Performed at [facility], [place] [address] [place], [place] [address]    Glucose-Capillary 05/04/2022 143 (H)  70 - 99 mg/dL Final   Glucose reference range applies only to samples taken after fasting for at least 8 hours.  Admission on 05/01/2022, Discharged on 05/01/2022  Component Date Value Ref Range Status   WBC 05/01/2022 8.8  4.0 - 10.5 K/uL Final   RBC 05/01/2022 4.68  4.22 - 5.81 MIL/uL Final   Hemoglobin 05/01/2022 13.3  13.0 - 17.0 g/dL Final   HCT 05/01/2022 39.1  39.0 - 52.0 % Final   MCV 05/01/2022 83.5  80.0 - 100.0 fL Final   MCH 05/01/2022 28.4  26.0 - 34.0 pg Final   MCHC 05/01/2022 34.0  30.0 - 36.0 g/dL Final   RDW 05/01/2022 14.1  11.5 - 15.5 % Final   Platelets 05/01/2022 302  150 - 400 K/uL Final   nRBC 05/01/2022 0.0  0.0 - 0.2 % Final   Performed at [facility], [place] [address], [place], [place] [address]   Sodium 05/01/2022 139  135 - 145 mmol/L Final   Potassium 05/01/2022 3.3 (L)  3.5 - 5.1 mmol/L Final   Chloride 05/01/2022 107  98 - 111 mmol/L Final   CO2 05/01/2022 22  22 - 32 mmol/L Final   Glucose, Bld 05/01/2022 82  70 - 99 mg/dL Final   Glucose reference range applies only to samples taken after fasting for at least 8 hours.   BUN 05/01/2022 12  6 - 20 mg/dL Final   Creatinine, Ser 05/01/2022 0.85  0.61 - 1.24 mg/dL Final   Calcium 05/01/2022 8.8 (L)  8.9 - 10.3 mg/dL Final   GFR, Estimated 05/01/2022 >60  >60 mL/min Final   Comment: (NOTE) Calculated using the CKD-EPI Creatinine Equation (2021)    Anion gap 05/01/2022 10  5 - 15 Final   Performed at [facility], [place] [address], [place], [place] [address]   Salicylate Lvl 10/23/8526 <7.0 (L)  7.0 - 30.0 mg/dL Final   Performed at [place] [address] [place], [place] [address]   Alcohol, Ethyl (B) 05/01/2022 47 (H)  <10 mg/dL Final    Comment: (NOTE) Lowest detectable limit for serum alcohol is 10 mg/dL.  For medical purposes only. Performed at [facility], [place] [address] [place], [place] [address]    SARS Coronavirus 2 by RT PCR 05/01/2022 NEGATIVE  NEGATIVE Final   Comment: (NOTE) SARS-CoV-2 target nucleic acids are NOT DETECTED.  The SARS-CoV-2 RNA is generally detectable in upper respiratory specimens during the acute phase of infection. The lowest concentration of SARS-CoV-2 viral copies this assay can detect is 138 copies/mL. A negative result does not preclude SARS-Cov-2 infection and should not be used as the sole basis for treatment or other patient management decisions. A negative  result may occur with  improper specimen collection/handling, submission of specimen other than nasopharyngeal swab, presence of viral mutation(s) within the areas targeted by this assay, and inadequate number of viral copies(<138 copies/mL). A negative result must be combined with clinical observations, patient history, and epidemiological information. The expected result is Negative.  Fact Sheet for Patients:  EntrepreneurPulse.com.au  Fact Sheet for Healthcare Providers:  IncredibleEmployment.be  This test is no                          t yet approved or cleared by the [place] FDA and  has been authorized for detection and/or diagnosis of SARS-CoV-2 by FDA under an Emergency Use Authorization (EUA). This EUA will remain  in effect (meaning this test can be used) for the duration of the COVID-19 declaration under Section 564(b)(1) of the Act, 21 U.S.C.section 360bbb-3(b)(1), unless the authorization is terminated  or revoked sooner.       Influenza A by PCR 05/01/2022 NEGATIVE  NEGATIVE Final   Influenza B by PCR 05/01/2022 NEGATIVE  NEGATIVE Final   Comment: (NOTE) The Xpert Xpress SARS-CoV-2/FLU/RSV plus assay is intended as an aid in the diagnosis of influenza  from Nasopharyngeal swab specimens and should not be used as a sole basis for treatment. Nasal washings and aspirates are unacceptable for Xpert Xpress SARS-CoV-2/FLU/RSV testing.  Fact Sheet for Patients: EntrepreneurPulse.com.au  Fact Sheet for Healthcare Providers: IncredibleEmployment.be  This test is not yet approved or cleared by the [place] FDA and has been authorized for detection and/or diagnosis of SARS-CoV-2 by FDA under an Emergency Use Authorization (EUA). This EUA will remain in effect (meaning this test can be used) for the duration of the COVID-19 declaration under Section 564(b)(1) of the Act, 21 U.S.C. section 360bbb-3(b)(1), unless the authorization is terminated or revoked.  Performed at [facility], [place] [address] [place] [address], [place], [place] [address]   Admission on 04/28/2022, Discharged on 04/28/2022  Component Date Value Ref Range Status   Sodium 04/28/2022 137  135 - 145 mmol/L Final   Potassium 04/28/2022 3.5  3.5 - 5.1 mmol/L Final   Chloride 04/28/2022 108  98 - 111 mmol/L Final   CO2 04/28/2022 22  22 - 32 mmol/L Final   Glucose, Bld 04/28/2022 93  70 - 99 mg/dL Final   Glucose reference range applies only to samples taken after fasting for at least 8 hours.   BUN 04/28/2022 14  6 - 20 mg/dL Final   Creatinine, Ser 04/28/2022 0.88  0.61 - 1.24 mg/dL Final   Calcium 04/28/2022 8.7 (L)  8.9 - 10.3 mg/dL Final   Total Protein 04/28/2022 6.3 (L)  6.5 - 8.1 g/dL Final   Albumin 04/28/2022 3.7  3.5 - 5.0 g/dL Final   AST 04/28/2022 27  15 - 41 U/L Final   ALT 04/28/2022 17  0 - 44 U/L Final   Alkaline Phosphatase 04/28/2022 50  38 - 126 U/L Final   Total Bilirubin 04/28/2022 0.4  0.3 - 1.2 mg/dL Final   GFR, Estimated 04/28/2022 >60  >60 mL/min Final   Comment: (NOTE) Calculated using the CKD-EPI Creatinine Equation (2021)    Anion gap 04/28/2022 7  5 - 15 Final   Performed at [place] [address], [place], [place] [address]   Alcohol, Ethyl (B) 04/28/2022 71 (H)  <10 mg/dL Final   Comment: (NOTE) Lowest detectable limit for serum alcohol is 10 mg/dL.  For medical purposes only. Performed at [facility], [place] [address], [place], [place] [address]    Salicylate Lvl 84/16/6063 <7.0 (L)  7.0 - 30.0 mg/dL Final   Performed at [facility] [address], [place], [place] [address]   Acetaminophen (Tylenol), Serum 04/28/2022 <10 (L)  10 - 30 ug/mL Final   Comment: (NOTE) Therapeutic concentrations vary significantly. A range of 10-30 ug/mL  may be an effective concentration for many patients. However, some  are best treated at concentrations outside of this range. Acetaminophen concentrations >150 ug/mL at 4 hours after ingestion  and >50 ug/mL at 12 hours after ingestion are often associated with  toxic reactions.  Performed at [facility], [place] [address] [place], [place] [address]    WBC 04/28/2022 10.8 (H)  4.0 - 10.5 K/uL Final   RBC 04/28/2022 4.40  4.22 - 5.81 MIL/uL Final   Hemoglobin 04/28/2022 12.5 (L)  13.0 - 17.0 g/dL Final   HCT 04/28/2022 37.3 (L)  39.0 - 52.0 % Final   MCV 04/28/2022 84.8  80.0 - 100.0 fL Final   MCH 04/28/2022 28.4  26.0 - 34.0 pg Final   MCHC 04/28/2022 33.5  30.0 - 36.0 g/dL Final   RDW 04/28/2022 14.0  11.5 - 15.5 % Final   Platelets 04/28/2022 288  150 - 400 K/uL Final   nRBC 04/28/2022 0.0  0.0 - 0.2 % Final   Performed at [facility] [address] [place], [place] [address]   Opiates 04/28/2022 NONE DETECTED  NONE DETECTED Final   Cocaine 04/28/2022 POSITIVE (A)  NONE DETECTED Final   Benzodiazepines 04/28/2022 NONE DETECTED  NONE DETECTED Final   Amphetamines 04/28/2022 NONE DETECTED  NONE DETECTED Final   Tetrahydrocannabinol 04/28/2022 POSITIVE (A)  NONE DETECTED Final   Barbiturates 04/28/2022 NONE DETECTED  NONE DETECTED Final   Comment: (NOTE) DRUG SCREEN FOR MEDICAL PURPOSES ONLY.  IF CONFIRMATION  IS NEEDED FOR ANY PURPOSE, NOTIFY LAB WITHIN 5 DAYS.  LOWEST DETECTABLE LIMITS FOR URINE DRUG SCREEN Drug Class                     Cutoff (ng/mL) Amphetamine and metabolites    1000 Barbiturate and metabolites    200 Benzodiazepine                 202 Tricyclics and metabolites     300 Opiates and metabolites        300 Cocaine and metabolites        300 THC                            50 Performed at [facility], [place] [address] [place], [place] [address]    SARS Coronavirus 2 by RT PCR 04/28/2022 NEGATIVE  NEGATIVE Final   Comment: (NOTE) SARS-CoV-2 target nucleic acids are NOT DETECTED.  The SARS-CoV-2 RNA is generally detectable in upper respiratory specimens during the acute phase of infection. The lowest concentration of SARS-CoV-2 viral copies this assay can detect is 138 copies/mL. A negative result does not preclude SARS-Cov-2 infection and should not be used as the sole basis for treatment or other patient management decisions. A negative result may occur with  improper specimen collection/handling, submission of specimen other than nasopharyngeal swab, presence of viral mutation(s) within the areas targeted by this assay, and inadequate number of viral copies(<138 copies/mL). A negative result  must be combined with clinical observations, patient history, and epidemiological information. The expected result is Negative.  Fact Sheet for Patients:  EntrepreneurPulse.com.au  Fact Sheet for Healthcare Providers:  IncredibleEmployment.be  This test is no                          t yet approved or cleared by the [place] FDA and  has been authorized for detection and/or diagnosis of SARS-CoV-2 by FDA under an Emergency Use Authorization (EUA). This EUA will remain  in effect (meaning this test can be used) for the duration of the COVID-19 declaration under Section 564(b)(1) of the Act, 21 U.S.C.section 360bbb-3(b)(1),  unless the authorization is terminated  or revoked sooner.       Influenza A by PCR 04/28/2022 NEGATIVE  NEGATIVE Final   Influenza B by PCR 04/28/2022 NEGATIVE  NEGATIVE Final   Comment: (NOTE) The Xpert Xpress SARS-CoV-2/FLU/RSV plus assay is intended as an aid in the diagnosis of influenza from Nasopharyngeal swab specimens and should not be used as a sole basis for treatment. Nasal washings and aspirates are unacceptable for Xpert Xpress SARS-CoV-2/FLU/RSV testing.  Fact Sheet for Patients: EntrepreneurPulse.com.au  Fact Sheet for Healthcare Providers: IncredibleEmployment.be  This test is not yet approved or cleared by the [place] FDA and has been authorized for detection and/or diagnosis of SARS-CoV-2 by FDA under an Emergency Use Authorization (EUA). This EUA will remain in effect (meaning this test can be used) for the duration of the COVID-19 declaration under Section 564(b)(1) of the Act, 21 U.S.C. section 360bbb-3(b)(1), unless the authorization is terminated or revoked.  Performed at [facility], [place] [address] [place], [place] [address]   Admission on 04/17/2022, Discharged on 04/17/2022  Component Date Value Ref Range Status   Sodium 04/17/2022 139  135 - 145 mmol/L Final   Potassium 04/17/2022 4.8  3.5 - 5.1 mmol/L Final   Chloride 04/17/2022 102  98 - 111 mmol/L Final   CO2 04/17/2022 20 (L)  22 - 32 mmol/L Final   Glucose, Bld 04/17/2022 198 (H)  70 - 99 mg/dL Final   Glucose reference range applies only to samples taken after fasting for at least 8 hours.   BUN 04/17/2022 24 (H)  6 - 20 mg/dL Final   Creatinine, Ser 04/17/2022 1.43 (H)  0.61 - 1.24 mg/dL Final   Calcium 04/17/2022 9.5  8.9 - 10.3 mg/dL Final   Total Protein 04/17/2022 7.8  6.5 - 8.1 g/dL Final   Albumin 04/17/2022 4.5  3.5 - 5.0 g/dL Final   AST 04/17/2022 40  15 - 41 U/L Final   ALT 04/17/2022 27  0 - 44 U/L Final   Alkaline Phosphatase  04/17/2022 63  38 - 126 U/L Final   Total Bilirubin 04/17/2022 0.6  0.3 - 1.2 mg/dL Final   GFR, Estimated 04/17/2022 >60  >60 mL/min Final   Comment: (NOTE) Calculated using the CKD-EPI Creatinine Equation (2021)    Anion gap 04/17/2022 17 (H)  5 - 15 Final   Performed at [facility] [address] [place], [place] [address]   WBC 04/17/2022 26.8 (H)  4.0 - 10.5 K/uL Final   RBC 04/17/2022 5.69  4.22 - 5.81 MIL/uL Final   Hemoglobin 04/17/2022 15.7  13.0 - 17.0 g/dL Final   HCT 04/17/2022 49.0  39.0 - 52.0 % Final   MCV 04/17/2022 86.1  80.0 - 100.0 fL Final   MCH  04/17/2022 27.6  26.0 - 34.0 pg Final   MCHC 04/17/2022 32.0  30.0 - 36.0 g/dL Final   RDW 04/17/2022 14.4  11.5 - 15.5 % Final   Platelets 04/17/2022 143 (L)  150 - 400 K/uL Final   REPEATED TO VERIFY   nRBC 04/17/2022 0.0  0.0 - 0.2 % Final   Neutrophils Relative % 04/17/2022 88  % Final   Neutro Abs 04/17/2022 23.4 (H)  1.7 - 7.7 K/uL Final   Lymphocytes Relative 04/17/2022 5  % Final   Lymphs Abs 04/17/2022 1.4  0.7 - 4.0 K/uL Final   Monocytes Relative 04/17/2022 6  % Final   Monocytes Absolute 04/17/2022 1.6 (H)  0.1 - 1.0 K/uL Final   Eosinophils Relative 04/17/2022 0  % Final   Eosinophils Absolute 04/17/2022 0.1  0.0 - 0.5 K/uL Final   Basophils Relative 04/17/2022 0  % Final   Basophils Absolute 04/17/2022 0.1  0.0 - 0.1 K/uL Final   Immature Granulocytes 04/17/2022 1  % Final   Abs Immature Granulocytes 04/17/2022 0.27 (H)  0.00 - 0.07 K/uL Final   Performed at [facility], [place] [address], [place], [place] [address]   Sodium 04/17/2022 137  135 - 145 mmol/L Final   Potassium 04/17/2022 5.0  3.5 - 5.1 mmol/L Final   Chloride 04/17/2022 102  98 - 111 mmol/L Final   BUN 04/17/2022 32 (H)  6 - 20 mg/dL Final   Creatinine, Ser 04/17/2022 1.20  0.61 - 1.24 mg/dL Final   Glucose, Bld 04/17/2022 197 (H)  70 - 99 mg/dL Final   Glucose reference range applies only to samples taken after fasting for at  least 8 hours.   Calcium, Ion 04/17/2022 1.00 (L)  1.15 - 1.40 mmol/L Final   TCO2 04/17/2022 29  22 - 32 mmol/L Final   Hemoglobin 04/17/2022 17.3 (H)  13.0 - 17.0 g/dL Final   HCT 04/17/2022 51.0  39.0 - 52.0 % Final   pH, Ven 04/17/2022 7.269  7.25 - 7.43 Final   pCO2, Ven 04/17/2022 58.3  44 - 60 mmHg Final   pO2, Ven 04/17/2022 204 (H)  32 - 45 mmHg Final   Bicarbonate 04/17/2022 26.7  20.0 - 28.0 mmol/L Final   TCO2 04/17/2022 28  22 - 32 mmol/L Final   O2 Saturation 04/17/2022 100  % Final   Acid-base deficit 04/17/2022 2.0  0.0 - 2.0 mmol/L Final   Sodium 04/17/2022 136  135 - 145 mmol/L Final   Potassium 04/17/2022 5.0  3.5 - 5.1 mmol/L Final   Calcium, Ion 04/17/2022 1.05 (L)  1.15 - 1.40 mmol/L Final   HCT 04/17/2022 50.0  39.0 - 52.0 % Final   Hemoglobin 04/17/2022 17.0  13.0 - 17.0 g/dL Final   Sample type 04/17/2022 VENOUS   Final   Alcohol, Ethyl (B) 04/17/2022 <10  <10 mg/dL Final   Comment: (NOTE) Lowest detectable limit for serum alcohol is 10 mg/dL.  For medical purposes only. Performed at [facility], [place] [address] [place], [place] [address]    Salicylate Lvl 22/01/5426 <7.0 (L)  7.0 - 30.0 mg/dL Final   Performed at [facility] [address], [place], [place] [address]   Acetaminophen (Tylenol), Serum 04/17/2022 <10 (L)  10 - 30 ug/mL Final   Comment: (NOTE) Therapeutic concentrations vary significantly. A range of 10-30 ug/mL  may be an effective concentration for many patients. However, some  are best treated at concentrations outside  of this range. Acetaminophen concentrations >150 ug/mL at 4 hours after ingestion  and >50 ug/mL at 12 hours after ingestion are often associated with  toxic reactions.  Performed at [facility], [place] [address], [place], [place] [address]    Total CK 04/17/2022 343  49 - 397 U/L Final   Performed at [facility] [address], [place], [place] [address]  Admission on 04/16/2022, Discharged  on 04/16/2022  Component Date Value Ref Range Status   SARS Coronavirus 2 by RT PCR 04/16/2022 NEGATIVE  NEGATIVE Final   Comment: (NOTE) SARS-CoV-2 target nucleic acids are NOT DETECTED.  The SARS-CoV-2 RNA is generally detectable in upper respiratory specimens during the acute phase of infection. The lowest concentration of SARS-CoV-2 viral copies this assay can detect is 138 copies/mL. A negative result does not preclude SARS-Cov-2 infection and should not be used as the sole basis for treatment or other patient management decisions. A negative result may occur with  improper specimen collection/handling, submission of specimen other than nasopharyngeal swab, presence of viral mutation(s) within the areas targeted by this assay, and inadequate number of viral copies(<138 copies/mL). A negative result must be combined with clinical observations, patient history, and epidemiological information. The expected result is Negative.  Fact Sheet for Patients:  EntrepreneurPulse.com.au  Fact Sheet for Healthcare Providers:  IncredibleEmployment.be  This test is no                          t yet approved or cleared by the [place] FDA and  has been authorized for detection and/or diagnosis of SARS-CoV-2 by FDA under an Emergency Use Authorization (EUA). This EUA will remain  in effect (meaning this test can be used) for the duration of the COVID-19 declaration under Section 564(b)(1) of the Act, 21 U.S.C.section 360bbb-3(b)(1), unless the authorization is terminated  or revoked sooner.       Influenza A by PCR 04/16/2022 NEGATIVE  NEGATIVE Final   Influenza B by PCR 04/16/2022 NEGATIVE  NEGATIVE Final   Comment: (NOTE) The Xpert Xpress SARS-CoV-2/FLU/RSV plus assay is intended as an aid in the diagnosis of influenza from Nasopharyngeal swab specimens and should not be used as a sole basis for treatment. Nasal washings and aspirates are  unacceptable for Xpert Xpress SARS-CoV-2/FLU/RSV testing.  Fact Sheet for Patients: EntrepreneurPulse.com.au  Fact Sheet for Healthcare Providers: IncredibleEmployment.be  This test is not yet approved or cleared by the [place] FDA and has been authorized for detection and/or diagnosis of SARS-CoV-2 by FDA under an Emergency Use Authorization (EUA). This EUA will remain in effect (meaning this test can be used) for the duration of the COVID-19 declaration under Section 564(b)(1) of the Act, 21 U.S.C. section 360bbb-3(b)(1), unless the authorization is terminated or revoked.  Performed at [facility], [place] [address], [place], [place] [address]    WBC 04/16/2022 10.0  4.0 - 10.5 K/uL Final   RBC 04/16/2022 5.09  4.22 - 5.81 MIL/uL Final   Hemoglobin 04/16/2022 14.3  13.0 - 17.0 g/dL Final   HCT 04/16/2022 42.0  39.0 - 52.0 % Final   MCV 04/16/2022 82.5  80.0 - 100.0 fL Final   MCH 04/16/2022 28.1  26.0 - 34.0 pg Final   MCHC 04/16/2022 34.0  30.0 - 36.0 g/dL Final   RDW 04/16/2022 14.1  11.5 - 15.5 % Final   Platelets 04/16/2022 308  150 - 400 K/uL Final  nRBC 04/16/2022 0.0  0.0 - 0.2 % Final   Neutrophils Relative % 04/16/2022 69  % Final   Neutro Abs 04/16/2022 7.0  1.7 - 7.7 K/uL Final   Lymphocytes Relative 04/16/2022 22  % Final   Lymphs Abs 04/16/2022 2.2  0.7 - 4.0 K/uL Final   Monocytes Relative 04/16/2022 6  % Final   Monocytes Absolute 04/16/2022 0.6  0.1 - 1.0 K/uL Final   Eosinophils Relative 04/16/2022 2  % Final   Eosinophils Absolute 04/16/2022 0.2  0.0 - 0.5 K/uL Final   Basophils Relative 04/16/2022 1  % Final   Basophils Absolute 04/16/2022 0.1  0.0 - 0.1 K/uL Final   Immature Granulocytes 04/16/2022 0  % Final   Abs Immature Granulocytes 04/16/2022 0.02  0.00 - 0.07 K/uL Final   Performed at [facility], [place] [address], [place], [place] [address]   Sodium 04/16/2022 135  135 - 145 mmol/L Final    Potassium 04/16/2022 3.5  3.5 - 5.1 mmol/L Final   Chloride 04/16/2022 97 (L)  98 - 111 mmol/L Final   CO2 04/16/2022 28  22 - 32 mmol/L Final   Glucose, Bld 04/16/2022 79  70 - 99 mg/dL Final   Glucose reference range applies only to samples taken after fasting for at least 8 hours.   BUN 04/16/2022 10  6 - 20 mg/dL Final   Creatinine, Ser 04/16/2022 0.87  0.61 - 1.24 mg/dL Final   Calcium 04/16/2022 9.4  8.9 - 10.3 mg/dL Final   Total Protein 04/16/2022 7.2  6.5 - 8.1 g/dL Final   Albumin 04/16/2022 4.1  3.5 - 5.0 g/dL Final   AST 04/16/2022 29  15 - 41 U/L Final   ALT 04/16/2022 22  0 - 44 U/L Final   Alkaline Phosphatase 04/16/2022 53  38 - 126 U/L Final   Total Bilirubin 04/16/2022 0.6  0.3 - 1.2 mg/dL Final   GFR, Estimated 04/16/2022 >60  >60 mL/min Final   Comment: (NOTE) Calculated using the CKD-EPI Creatinine Equation (2021)    Anion gap 04/16/2022 10  5 - 15 Final   Performed at [facility] [address] [place], [place] [address]   Hgb A1c MFr Bld 04/16/2022 5.2  4.8 - 5.6 % Final   Comment: (NOTE) Pre diabetes:          5.7%-6.4%  Diabetes:              >6.4%  Glycemic control for   <7.0% adults with diabetes    Mean Plasma Glucose 04/16/2022 102.54  mg/dL Final   Performed at [facility], [place] [address] [place], [place] [address]   Cholesterol 04/16/2022 190  0 - 200 mg/dL Final   Triglycerides 04/16/2022 40  <150 mg/dL Final   HDL 04/16/2022 90  >40 mg/dL Final   Total CHOL/HDL Ratio 04/16/2022 2.1  RATIO Final   VLDL 04/16/2022 8  0 - 40 mg/dL Final   LDL Cholesterol 04/16/2022 92  0 - 99 mg/dL Final   Comment:        Total Cholesterol/HDL:CHD Risk Coronary Heart Disease Risk Table                     Men   Women  1/2 Average Risk   3.4   3.3  Average Risk       5.0   4.4  2 X Average Risk   9.6   7.1  3  X Average Risk  23.4   11.0        Use the calculated Patient Ratio above and the CHD Risk Table to determine the patient's CHD  Risk.        ATP III CLASSIFICATION (LDL):  <100     mg/dL   Optimal  100-129  mg/dL   Near or Above                    Optimal  130-159  mg/dL   Borderline  160-189  mg/dL   High  >190     mg/dL   Very High Performed at [facility] [address], [place], [place] [address]    TSH 04/16/2022 1.509  0.350 - 4.500 uIU/mL Final   Comment: Performed by a 3rd Generation assay with a functional sensitivity of <=0.01 uIU/mL. Performed at [facility], [place] [address] [place], [place] [address]    POC Amphetamine UR 04/16/2022 None Detected  NONE DETECTED (Cut Off Level 1000 ng/mL) Final   POC Secobarbital (BAR) 04/16/2022 None Detected  NONE DETECTED (Cut Off Level 300 ng/mL) Final   POC Buprenorphine (BUP) 04/16/2022 None Detected  NONE DETECTED (Cut Off Level 10 ng/mL) Final   POC Oxazepam (BZO) 04/16/2022 None Detected  NONE DETECTED (Cut Off Level 300 ng/mL) Final   POC Cocaine UR 04/16/2022 Positive (A)  NONE DETECTED (Cut Off Level 300 ng/mL) Final   POC Methamphetamine UR 04/16/2022 None Detected  NONE DETECTED (Cut Off Level 1000 ng/mL) Final   POC Morphine 04/16/2022 None Detected  NONE DETECTED (Cut Off Level 300 ng/mL) Final   POC Oxycodone UR 04/16/2022 None Detected  NONE DETECTED (Cut Off Level 100 ng/mL) Final   POC Methadone UR 04/16/2022 None Detected  NONE DETECTED (Cut Off Level 300 ng/mL) Final   POC Marijuana UR 04/16/2022 None Detected  NONE DETECTED (Cut Off Level 50 ng/mL) Final   SARSCOV2ONAVIRUS 2 AG 04/16/2022 NEGATIVE  NEGATIVE Final   Comment: (NOTE) SARS-CoV-2 antigen NOT DETECTED.   Negative results are presumptive.  Negative results do not preclude SARS-CoV-2 infection and should not be used as the sole basis for treatment or other patient management decisions, including infection  control decisions, particularly in the presence of clinical signs and  symptoms consistent with COVID-19, or in those who have been in contact with the virus.   Negative results must be combined with clinical observations, patient history, and epidemiological information. The expected result is Negative.  Fact Sheet for Patients: HandmadeRecipes.com.cy  Fact Sheet for Healthcare Providers: FuneralLife.at  This test is not yet approved or cleared by the [place] FDA and  has been authorized for detection and/or diagnosis of SARS-CoV-2 by FDA under an Emergency Use Authorization (EUA).  This EUA will remain in effect (meaning this test can be used) for the duration of  the COV                          ID-19 declaration under Section 564(b)(1) of the Act, 21 U.S.C. section 360bbb-3(b)(1), unless the authorization is terminated or revoked sooner.    Admission on 04/04/2022, Discharged on 04/05/2022  Component Date Value Ref Range Status   SARS Coronavirus 2 by RT PCR 04/04/2022 NEGATIVE  NEGATIVE Final   Comment: (NOTE) SARS-CoV-2 target nucleic acids are NOT DETECTED.  The SARS-CoV-2 RNA is generally detectable in upper respiratory specimens during the acute phase of infection. The lowest concentration  of SARS-CoV-2 viral copies this assay can detect is 138 copies/mL. A negative result does not preclude SARS-Cov-2 infection and should not be used as the sole basis for treatment or other patient management decisions. A negative result may occur with  improper specimen collection/handling, submission of specimen other than nasopharyngeal swab, presence of viral mutation(s) within the areas targeted by this assay, and inadequate number of viral copies(<138 copies/mL). A negative result must be combined with clinical observations, patient history, and epidemiological information. The expected result is Negative.  Fact Sheet for Patients:  EntrepreneurPulse.com.au  Fact Sheet for Healthcare Providers:  IncredibleEmployment.be  This test is no                           t yet approved or cleared by the [place] FDA and  has been authorized for detection and/or diagnosis of SARS-CoV-2 by FDA under an Emergency Use Authorization (EUA). This EUA will remain  in effect (meaning this test can be used) for the duration of the COVID-19 declaration under Section 564(b)(1) of the Act, 21 U.S.C.section 360bbb-3(b)(1), unless the authorization is terminated  or revoked sooner.       Influenza A by PCR 04/04/2022 NEGATIVE  NEGATIVE Final   Influenza B by PCR 04/04/2022 NEGATIVE  NEGATIVE Final   Comment: (NOTE) The Xpert Xpress SARS-CoV-2/FLU/RSV plus assay is intended as an aid in the diagnosis of influenza from Nasopharyngeal swab specimens and should not be used as a sole basis for treatment. Nasal washings and aspirates are unacceptable for Xpert Xpress SARS-CoV-2/FLU/RSV testing.  Fact Sheet for Patients: EntrepreneurPulse.com.au  Fact Sheet for Healthcare Providers: IncredibleEmployment.be  This test is not yet approved or cleared by the [place] FDA and has been authorized for detection and/or diagnosis of SARS-CoV-2 by FDA under an Emergency Use Authorization (EUA). This EUA will remain in effect (meaning this test can be used) for the duration of the COVID-19 declaration under Section 564(b)(1) of the Act, 21 U.S.C. section 360bbb-3(b)(1), unless the authorization is terminated or revoked.  Performed at [facility], [place] [address], [place], [place] [address]    WBC 04/04/2022 6.6  4.0 - 10.5 K/uL Final   RBC 04/04/2022 5.41  4.22 - 5.81 MIL/uL Final   Hemoglobin 04/04/2022 14.9  13.0 - 17.0 g/dL Final   HCT 04/04/2022 44.3  39.0 - 52.0 % Final   MCV 04/04/2022 81.9  80.0 - 100.0 fL Final   MCH 04/04/2022 27.5  26.0 - 34.0 pg Final   MCHC 04/04/2022 33.6  30.0 - 36.0 g/dL Final   RDW 04/04/2022 14.5  11.5 - 15.5 % Final   Platelets 04/04/2022 328  150 - 400 K/uL Final   nRBC  04/04/2022 0.0  0.0 - 0.2 % Final   Neutrophils Relative % 04/04/2022 52  % Final   Neutro Abs 04/04/2022 3.5  1.7 - 7.7 K/uL Final   Lymphocytes Relative 04/04/2022 33  % Final   Lymphs Abs 04/04/2022 2.2  0.7 - 4.0 K/uL Final   Monocytes Relative 04/04/2022 10  % Final   Monocytes Absolute 04/04/2022 0.6  0.1 - 1.0 K/uL Final   Eosinophils Relative 04/04/2022 4  % Final   Eosinophils Absolute 04/04/2022 0.2  0.0 - 0.5 K/uL Final   Basophils Relative 04/04/2022 1  % Final   Basophils Absolute 04/04/2022 0.1  0.0 - 0.1 K/uL Final   Immature Granulocytes 04/04/2022 0  % Final   Abs Immature  Granulocytes 04/04/2022 0.01  0.00 - 0.07 K/uL Final   Performed at [facility] [address] [place], [place] [address]   Sodium 04/04/2022 140  135 - 145 mmol/L Final   Potassium 04/04/2022 3.5  3.5 - 5.1 mmol/L Final   Chloride 04/04/2022 105  98 - 111 mmol/L Final   CO2 04/04/2022 29  22 - 32 mmol/L Final   Glucose, Bld 04/04/2022 60 (L)  70 - 99 mg/dL Final   Glucose reference range applies only to samples taken after fasting for at least 8 hours.   BUN 04/04/2022 25 (H)  6 - 20 mg/dL Final   Creatinine, Ser 04/04/2022 1.14  0.61 - 1.24 mg/dL Final   Calcium 04/04/2022 9.5  8.9 - 10.3 mg/dL Final   Total Protein 04/04/2022 7.1  6.5 - 8.1 g/dL Final   Albumin 04/04/2022 4.0  3.5 - 5.0 g/dL Final   AST 04/04/2022 33  15 - 41 U/L Final   ALT 04/04/2022 23  0 - 44 U/L Final   Alkaline Phosphatase 04/04/2022 49  38 - 126 U/L Final   Total Bilirubin 04/04/2022 0.9  0.3 - 1.2 mg/dL Final   GFR, Estimated 04/04/2022 >60  >60 mL/min Final   Comment: (NOTE) Calculated using the CKD-EPI Creatinine Equation (2021)    Anion gap 04/04/2022 6  5 - 15 Final   Performed at [facility] [address], [place], [place] [address]   Hgb A1c MFr Bld 04/04/2022 5.0  4.8 - 5.6 % Final   Comment: (NOTE) Pre diabetes:          5.7%-6.4%  Diabetes:              >6.4%  Glycemic control for    <7.0% adults with diabetes    Mean Plasma Glucose 04/04/2022 96.8  mg/dL Final   Performed at [facility] [address] [place],  [address]   POC Amphetamine UR 04/05/2022 Positive (A)  NONE DETECTED (Cut Off Level 1000 ng/mL) Preliminary   POC Secobarbital (BAR) 04/05/2022 None Detected  NONE DETECTED (Cut Off Level 300 ng/mL) Preliminary   POC Buprenorphine (BUP) 04/05/2022 None Detected  NONE DETECTED (Cut Off Level 10 ng/mL) Preliminary   POC Oxazepam (BZO) 04/05/2022 None Detected  NONE DETECTED (Cut Off Level 300 ng/mL) Preliminary   POC Cocaine UR 04/05/2022 Positive (A)  NONE DETECTED (Cut Off Level 300 ng/mL) Preliminary   POC Methamphetamine UR 04/05/2022 Positive (A)  NONE DETECTED (Cut Off Level 1000 ng/mL) Preliminary   POC Morphine 04/05/2022 None Detected  NONE DETECTED (Cut Off Level 300 ng/mL) Preliminary   POC Oxycodone UR 04/05/2022 None Detected  NONE DETECTED (Cut Off Level 100 ng/mL) Preliminary   POC Methadone UR 04/05/2022 None Detected  NONE DETECTED (Cut Off Level 300 ng/mL) Preliminary   POC Marijuana UR 04/05/2022 Positive (A)  NONE DETECTED (Cut Off Level 50 ng/mL) Preliminary   SARS Coronavirus 2 Ag 04/04/2022 Negative  Negative Preliminary   Cholesterol 04/04/2022 202 (H)  0 - 200 mg/dL Final   Triglycerides 04/04/2022 53  <150 mg/dL Final   HDL 04/04/2022 78  >40 mg/dL Final   Total CHOL/HDL Ratio 04/04/2022 2.6  RATIO Final   VLDL 04/04/2022 11  0 - 40 mg/dL Final   LDL Cholesterol 04/04/2022 113 (H)  0 - 99 mg/dL Final   Comment:        Total Cholesterol/HDL:CHD Risk Coronary Heart Disease Risk Table  Men   Women  1/2 Average Risk   3.4   3.3  Average Risk       5.0   4.4  2 X Average Risk   9.6   7.1  3 X Average Risk  23.4   11.0        Use the calculated Patient Ratio above and the CHD Risk Table to determine the patient's CHD Risk.        ATP III CLASSIFICATION (LDL):  <100     mg/dL   Optimal  100-129   mg/dL   Near or Above                    Optimal  130-159  mg/dL   Borderline  160-189  mg/dL   High  >190     mg/dL   Very High Performed at [facility] [address], [place], [place] [address]   There may be more visits with results that are not included.    Allergies: Patient has no known allergies.  PTA Medications: (Not in a hospital admission)   Long Term Goals: Improvement in symptoms so as ready for discharge  Short Term Goals: Patient will verbalize feelings in meetings with treatment team members., Patient will attend at least of 50% of the groups daily., Pt will complete the PHQ9 on admission, day 3 and discharge., Patient will participate in completing the [place] q shift., Patient will remain free of seclusion/restraint/assault incidents 24 hours prior to discharge., and Patient will take medications as prescribed daily.  Medical Decision Making  Mearle Drew [facility] [place] [facility] under the service of Emorie Mcfate B, NP for Substance induced mood disorder [facility]), crisis management, and stabilization. Routine labs ordered, which include  Lab Orders         Resp Panel by RT-PCR (Flu A&B, Covid) Anterior Nasal Swab         CBC with Differential/Platelet         Comprehensive metabolic panel         Ethanol         Urinalysis, Routine w reflex microscopic Urine, Clean Catch         POCT Urine Drug Screen - (I-Screen)    Medication Management: Medications started Meds ordered this encounter  Medications   acetaminophen (TYLENOL) tablet 650 mg   alum & mag hydroxide-simeth (MAALOX/MYLANTA) 200-200-20 MG/5ML suspension 30 mL   magnesium hydroxide (MILK OF MAGNESIA) suspension 30 mL   hydrOXYzine (ATARAX) tablet 25 mg   traZODone (DESYREL) tablet 50 mg   paliperidone (INVEGA SUSTENNA) injection 234 mg    Will maintain observation checks every 15 minutes for  safety Psychosocial education regarding relapse prevention and self-care; social and communication  Social work will consult with family for collateral information and discuss discharge and follow up plan.     Recommendations  Based on my evaluation the patient appears to have an emergency medical condition for which I recommend the patient be transferred to the emergency department for further evaluation.  Melvern Ramone, NP 06/05/22  10:19 AM

## 2022-06-05 NOTE — ED Notes (Signed)
Patient given meal. Patient resting safely on unit with continued monitoring.

## 2022-06-05 NOTE — ED Notes (Signed)
[facility] has been brought to [facility] talking with provider.

## 2022-06-05 NOTE — ED Notes (Signed)
Patient admitted to [facility] from [facility] due to accidental overdose on fentynl.  Patients drug of choice is cocaine.  Patient is difficult to engage, appears sedated and has poor eye contact.  Patient initially reported he wanted substance abuse treatment however once on unit he is wavering on the follow up treatment.  When patient arrived on unit he was malodorous and was directed to take a shower.  Patient went into the bathroom turned the shower on but did not shower himself.  Patient remains malodorous.  He was given a snack, oriented to unit and shown to his room where he remains at present.  Patient appears to be "crashing" from cocaine use.  Patient had a plan to overdose on street drugs however once on unit he has denies active suicidal ideation or plan.  Encouraged patient to seek staff if overwhelmed.  Will monitor and provide safe supportive environment.

## 2022-06-05 NOTE — ED Notes (Signed)
Pt asleep in bed. Respirations even and unlabored. Will continue to monitor for safety. ?

## 2022-06-05 NOTE — ED Notes (Signed)
Transferred to [facility]

## 2022-06-05 NOTE — Discharge Instructions (Addendum)
You have an appointment with the following for your injectible:  27 June 2022 @ 10:00 am  [facility] [address] [place], [place], [address] [phone] phone   In case of an urgent crisis, you may contact the Mobile Crisis Unit with [facility] at [phone].   Please maintain any appointments you already have for mental health and primary care.

## 2022-06-05 NOTE — ED Notes (Signed)
Patient calm and sleeping in bedroom

## 2022-06-05 NOTE — ED Notes (Signed)
Patient calm in bedroom sleeping

## 2022-06-05 NOTE — ED Notes (Signed)
Pt resting in bed. A&O x4, calm and cooperative. Denies current SI/HI/AVH. Denies any current needs. No signs of acute distress noted. Will continue to monitor for safety.

## 2022-06-05 NOTE — BH Assessment (Signed)
Pt had an accidental overdose and treated at [facility] on 06/04/22. Pt was discharged and referred to [facility] four outpt services. Pt reports he slept in the lobby at [facility] after discharged, caught a bus here today and is looking for inpt treatment to address his substance use. Pt denies SI, HI, AVH and has not used any drugs since yesterday.

## 2022-06-06 DIAGNOSIS — F1914 Other psychoactive substance abuse with psychoactive substance-induced mood disorder: Secondary | ICD-10-CM | POA: Diagnosis not present

## 2022-06-06 DIAGNOSIS — Z20822 Contact with and (suspected) exposure to covid-19: Secondary | ICD-10-CM | POA: Diagnosis not present

## 2022-06-06 DIAGNOSIS — Z5900 Homelessness unspecified: Secondary | ICD-10-CM | POA: Diagnosis not present

## 2022-06-06 NOTE — Progress Notes (Signed)
Received Tyler White this AM asleep in his bed, he woke up on his own and received breakfast. He said he feels ok and ready to go home. He denied all of the psychiatric symptoms this morning including feeling suicidal. He returned to his room after breakfast.

## 2022-06-06 NOTE — ED Notes (Signed)
Pt has his mattress on the floor blocking door, but pt is visible

## 2022-06-06 NOTE — ED Notes (Signed)
Pt A &O x4 resting at present, no distress noted.  Pt calm & cooperative.  Denies SI.  Monitoring for safety.

## 2022-06-06 NOTE — ED Notes (Signed)
Pt sleeping with Mattress on the floor at present. No distress noted.  Respirations even & unlabored.  Monitoring for safety.

## 2022-06-06 NOTE — ED Provider Notes (Signed)
Behavioral Health Progress Note  Date and Time: 06/06/2022 2:02 PM Name: Tyler White MRN:  [id number]  Subjective:   26 year old male with schizoaffective disorder, h/o malingering polysubstance abuse who presented to the [facility] behavioral health urgent care on 06/05/2022 requesting assistance with substance use treatment after unintentionally overdosing on fentanyl on 06/04/2022.  Patient was admitted to the [facility] for crisis stabilization. UDS + cocaine, oxycodone, marijuana; EtOH negative.  Patient seen and chart reviewed-patient has been appropriate with staff and peers on the unit.  Patient interviewed this morning, patient remains malodorous.  Patient denies SI/HI/AVH.  He reports his mood is "good".  He denies issues with sleep or appetite.  He denies all physical symptoms.  Discussed residential rehab with patient at length-patient declines at this time and indicates that he would like resources for outpatient substance use treatment.  Discussed with patient that since he has been here his mood has improved and he has been restarted on his LAI and that if he is not interested in residential substance use treatment the plan will be to discharge tomorrow.  Patient is in agreement with plan and verbalizes understanding.  Discussed with patient that prior to discharge an appointment will be made for him in the [facility] clinic so that he may continue receiving his shots.  Patient verbalized understanding and was in agreement.  Diagnosis:  Final diagnoses:  Polysubstance abuse ([place])  Substance induced mood disorder ([facility])  Homelessness    Total Time spent with patient: 15 minutes  Past Psychiatric History: schizoaffective disorder, malingering, SIPD, polysubstance abuse Past Medical History:  Past Medical History:  Diagnosis Date   Hernia, inguinal, right    Inguinal hernia    right   Psychiatric illness    Schizophrenia ([place])    History reviewed. No pertinent surgical history. Family  History:  Family History  Problem Relation Age of Onset   Psychiatric Illness Mother    Hypertension Sister    Family Psychiatric  History:  Mothet with "mental illness" per chart Social History:  Social History   Substance and Sexual Activity  Alcohol Use Not Currently     Social History   Substance and Sexual Activity  Drug Use Not Currently   Types: Marijuana, Cocaine   Comment: crack/cocaine occasionally    Social History   Socioeconomic History   Marital status: Single    Spouse name: Not on file   Number of children: Not on file   Years of education: 10   Highest education level: Not on file  Occupational History   Not on file  Tobacco Use   Smoking status: Some Days    Packs/day: 0.50    Years: 5.00    Total pack years: 2.50    Types: Cigarettes   Smokeless tobacco: Never  Vaping Use   Vaping Use: Never used  Substance and Sexual Activity   Alcohol use: Not Currently   Drug use: Not Currently    Types: Marijuana, Cocaine    Comment: crack/cocaine occasionally   Sexual activity: Yes    Birth control/protection: None  Other Topics Concern   Not on file  Social History Narrative   ** Merged History Encounter **       ** Merged History Encounter **       Social Determinants of Health   Financial Resource Strain: Not on file  Food Insecurity: Not on file  Transportation Needs: Not on file  Physical Activity: Not on file  Stress: Not on file  Social  Connections: Not on file   SDOH:  SDOH Screenings   Alcohol Screen: Low Risk  (01/29/2022)   Alcohol Screen    Last Alcohol Screening Score (AUDIT): 0  Depression (PHQ2-9): Low Risk  (06/05/2022)   Depression (PHQ2-9)    PHQ-2 Score: 4  Financial Resource Strain: Not on file  Food Insecurity: Not on file  Housing: Not on file  Physical Activity: Not on file  Social Connections: Not on file  Stress: Not on file  Tobacco Use: High Risk (06/05/2022)   Patient History    Smoking Tobacco Use: Some  Days    Smokeless Tobacco Use: Never    Passive Exposure: Not on file  Transportation Needs: Not on file   Additional Social History:                         Sleep: Good  Appetite:  Good  Current Medications:  Current Facility-Administered Medications  Medication Dose Route Frequency Provider Last Rate Last Admin   acetaminophen (TYLENOL) tablet 650 mg  650 mg Oral Q6H PRN Rankin, Shuvon B, NP       alum & mag hydroxide-simeth (MAALOX/MYLANTA) 200-200-20 MG/5ML suspension 30 mL  30 mL Oral Q4H PRN Rankin, Shuvon B, NP       hydrOXYzine (ATARAX) tablet 25 mg  25 mg Oral TID PRN Rankin, Shuvon B, NP   25 mg at 06/06/22 1238   magnesium hydroxide (MILK OF MAGNESIA) suspension 30 mL  30 mL Oral Daily PRN Rankin, Shuvon B, NP       traZODone (DESYREL) tablet 50 mg  50 mg Oral QHS PRN Rankin, Shuvon B, NP       Current Outpatient Medications  Medication Sig Dispense Refill   paliperidone (INVEGA SUSTENNA) 234 MG/1.5ML SUSY injection Inject 234 mg into the muscle once for 1 dose. 1.5 mL 0    Labs  Lab Results:  Admission on 06/05/2022  Component Date Value Ref Range Status   SARS Coronavirus 2 by RT PCR 06/05/2022 NEGATIVE  NEGATIVE Final   Comment: (NOTE) SARS-CoV-2 target nucleic acids are NOT DETECTED.  The SARS-CoV-2 RNA is generally detectable in upper respiratory specimens during the acute phase of infection. The lowest concentration of SARS-CoV-2 viral copies this assay can detect is 138 copies/mL. A negative result does not preclude SARS-Cov-2 infection and should not be used as the sole basis for treatment or other patient management decisions. A negative result may occur with  improper specimen collection/handling, submission of specimen other than nasopharyngeal swab, presence of viral mutation(s) within the areas targeted by this assay, and inadequate number of viral copies(<138 copies/mL). A negative result must be combined with clinical observations,  patient history, and epidemiological information. The expected result is Negative.  Fact Sheet for Patients:  EntrepreneurPulse.com.au  Fact Sheet for Healthcare Providers:  IncredibleEmployment.be  This test is no                          t yet approved or cleared by the [place] FDA and  has been authorized for detection and/or diagnosis of SARS-CoV-2 by FDA under an Emergency Use Authorization (EUA). This EUA will remain  in effect (meaning this test can be used) for the duration of the COVID-19 declaration under Section 564(b)(1) of the Act, 21 U.S.C.section 360bbb-3(b)(1), unless the authorization is terminated  or revoked sooner.       Influenza A by PCR 06/05/2022 NEGATIVE  NEGATIVE Final   Influenza B by PCR 06/05/2022 NEGATIVE  NEGATIVE Final   Comment: (NOTE) The Xpert Xpress SARS-CoV-2/FLU/RSV plus assay is intended as an aid in the diagnosis of influenza from Nasopharyngeal swab specimens and should not be used as a sole basis for treatment. Nasal washings and aspirates are unacceptable for Xpert Xpress SARS-CoV-2/FLU/RSV testing.  Fact Sheet for Patients: EntrepreneurPulse.com.au  Fact Sheet for Healthcare Providers: IncredibleEmployment.be  This test is not yet approved or cleared by the [place] FDA and has been authorized for detection and/or diagnosis of SARS-CoV-2 by FDA under an Emergency Use Authorization (EUA). This EUA will remain in effect (meaning this test can be used) for the duration of the COVID-19 declaration under Section 564(b)(1) of the Act, 21 U.S.C. section 360bbb-3(b)(1), unless the authorization is terminated or revoked.  Performed at [facility], [place] [address], [place], [place] [address]    WBC 06/05/2022 9.6  4.0 - 10.5 K/uL Final   RBC 06/05/2022 5.08  4.22 - 5.81 MIL/uL Final   Hemoglobin 06/05/2022 14.5  13.0 - 17.0 g/dL Final   HCT  06/05/2022 42.3  39.0 - 52.0 % Final   MCV 06/05/2022 83.3  80.0 - 100.0 fL Final   MCH 06/05/2022 28.5  26.0 - 34.0 pg Final   MCHC 06/05/2022 34.3  30.0 - 36.0 g/dL Final   RDW 06/05/2022 13.6  11.5 - 15.5 % Final   Platelets 06/05/2022 323  150 - 400 K/uL Final   nRBC 06/05/2022 0.0  0.0 - 0.2 % Final   Neutrophils Relative % 06/05/2022 67  % Final   Neutro Abs 06/05/2022 6.5  1.7 - 7.7 K/uL Final   Lymphocytes Relative 06/05/2022 23  % Final   Lymphs Abs 06/05/2022 2.2  0.7 - 4.0 K/uL Final   Monocytes Relative 06/05/2022 7  % Final   Monocytes Absolute 06/05/2022 0.7  0.1 - 1.0 K/uL Final   Eosinophils Relative 06/05/2022 2  % Final   Eosinophils Absolute 06/05/2022 0.2  0.0 - 0.5 K/uL Final   Basophils Relative 06/05/2022 1  % Final   Basophils Absolute 06/05/2022 0.1  0.0 - 0.1 K/uL Final   Immature Granulocytes 06/05/2022 0  % Final   Abs Immature Granulocytes 06/05/2022 0.02  0.00 - 0.07 K/uL Final   Performed at [facility], [place] [address] [place], [place] [address]   Sodium 06/05/2022 136  135 - 145 mmol/L Final   Potassium 06/05/2022 3.6  3.5 - 5.1 mmol/L Final   Chloride 06/05/2022 100  98 - 111 mmol/L Final   CO2 06/05/2022 27  22 - 32 mmol/L Final   Glucose, Bld 06/05/2022 90  70 - 99 mg/dL Final   Glucose reference range applies only to samples taken after fasting for at least 8 hours.   BUN 06/05/2022 15  6 - 20 mg/dL Final   Creatinine, Ser 06/05/2022 1.01  0.61 - 1.24 mg/dL Final   Calcium 06/05/2022 9.6  8.9 - 10.3 mg/dL Final   Total Protein 06/05/2022 6.3 (L)  6.5 - 8.1 g/dL Final   Albumin 06/05/2022 3.8  3.5 - 5.0 g/dL Final   AST 06/05/2022 41  15 - 41 U/L Final   ALT 06/05/2022 28  0 - 44 U/L Final   Alkaline Phosphatase 06/05/2022 48  38 - 126 U/L Final   Total Bilirubin 06/05/2022 0.5  0.3 - 1.2 mg/dL Final   GFR, Estimated 06/05/2022 >60  >60 mL/min Final   Comment: (NOTE)  Calculated using the CKD-EPI Creatinine Equation (2021)    Anion gap  06/05/2022 9  5 - 15 Final   Performed at [facility], [place] [address], [place], [place] [address]   Alcohol, Ethyl (B) 06/05/2022 <10  <10 mg/dL Final   Comment: (NOTE) Lowest detectable limit for serum alcohol is 10 mg/dL.  For medical purposes only. Performed at [facility], [place] [address], [place], [place] [address]    Color, Urine 06/05/2022 YELLOW  YELLOW Final   APPearance 06/05/2022 CLEAR  CLEAR Final   Specific Gravity, Urine 06/05/2022 1.012  1.005 - 1.030 Final   pH 06/05/2022 6.0  5.0 - 8.0 Final   Glucose, UA 06/05/2022 NEGATIVE  NEGATIVE mg/dL Final   Hgb urine dipstick 06/05/2022 NEGATIVE  NEGATIVE Final   Bilirubin Urine 06/05/2022 NEGATIVE  NEGATIVE Final   Ketones, ur 06/05/2022 NEGATIVE  NEGATIVE mg/dL Final   Protein, ur 06/05/2022 NEGATIVE  NEGATIVE mg/dL Final   Nitrite 06/05/2022 NEGATIVE  NEGATIVE Final   Leukocytes,Ua 06/05/2022 SMALL (A)  NEGATIVE Final   RBC / HPF 06/05/2022 0-5  0 - 5 RBC/hpf Final   WBC, UA 06/05/2022 11-20  0 - 5 WBC/hpf Final   Bacteria, UA 06/05/2022 FEW (A)  NONE SEEN Final   Squamous Epithelial / LPF 06/05/2022 0-5  0 - 5 Final   Mucus 06/05/2022 PRESENT   Final   Performed at [facility], [place] [address] [place], [place] [address]   POC Amphetamine UR 06/05/2022 None Detected  NONE DETECTED (Cut Off Level 1000 ng/mL) Final   POC Secobarbital (BAR) 06/05/2022 None Detected  NONE DETECTED (Cut Off Level 300 ng/mL) Final   POC Buprenorphine (BUP) 06/05/2022 None Detected  NONE DETECTED (Cut Off Level 10 ng/mL) Final   POC Oxazepam (BZO) 06/05/2022 None Detected  NONE DETECTED (Cut Off Level 300 ng/mL) Final   POC Cocaine UR 06/05/2022 Positive (A)  NONE DETECTED (Cut Off Level 300 ng/mL) Final   POC Methamphetamine UR 06/05/2022 None Detected  NONE DETECTED (Cut Off Level 1000 ng/mL) Final   POC Morphine 06/05/2022 None Detected  NONE DETECTED (Cut Off Level 300 ng/mL) Final   POC Methadone UR 06/05/2022 None  Detected  NONE DETECTED (Cut Off Level 300 ng/mL) Final   POC Oxycodone UR 06/05/2022 Positive (A)  NONE DETECTED (Cut Off Level 100 ng/mL) Final   POC Marijuana UR 06/05/2022 Positive (A)  NONE DETECTED (Cut Off Level 50 ng/mL) Final  Admission on 05/30/2022, Discharged on 05/30/2022  Component Date Value Ref Range Status   SARS Coronavirus 2 by RT PCR 05/30/2022 NEGATIVE  NEGATIVE Final   Comment: (NOTE) SARS-CoV-2 target nucleic acids are NOT DETECTED.  The SARS-CoV-2 RNA is generally detectable in upper respiratory specimens during the acute phase of infection. The lowest concentration of SARS-CoV-2 viral copies this assay can detect is 138 copies/mL. A negative result does not preclude SARS-Cov-2 infection and should not be used as the sole basis for treatment or other patient management decisions. A negative result may occur with  improper specimen collection/handling, submission of specimen other than nasopharyngeal swab, presence of viral mutation(s) within the areas targeted by this assay, and inadequate number of viral copies(<138 copies/mL). A negative result must be combined with clinical observations, patient history, and epidemiological information. The expected result is Negative.  Fact Sheet for Patients:  EntrepreneurPulse.com.au  Fact Sheet for Healthcare Providers:  IncredibleEmployment.be  This test is no  t yet approved or cleared by the [place] and  has been authorized for detection and/or diagnosis of SARS-CoV-2 by FDA under an Emergency Use Authorization (EUA). This EUA will remain  in effect (meaning this test can be used) for the duration of the COVID-19 declaration under Section 564(b)(1) of the Act, 21 U.S.C.section 360bbb-3(b)(1), unless the authorization is terminated  or revoked sooner.       Influenza A by PCR 05/30/2022 NEGATIVE  NEGATIVE Final   Influenza B by PCR 05/30/2022  NEGATIVE  NEGATIVE Final   Comment: (NOTE) The Xpert Xpress SARS-CoV-2/FLU/RSV plus assay is intended as an aid in the diagnosis of influenza from Nasopharyngeal swab specimens and should not be used as a sole basis for treatment. Nasal washings and aspirates are unacceptable for Xpert Xpress SARS-CoV-2/FLU/RSV testing.  Fact Sheet for Patients: EntrepreneurPulse.com.au  Fact Sheet for Healthcare Providers: IncredibleEmployment.be  This test is not yet approved or cleared by the [place] FDA and has been authorized for detection and/or diagnosis of SARS-CoV-2 by FDA under an Emergency Use Authorization (EUA). This EUA will remain in effect (meaning this test can be used) for the duration of the COVID-19 declaration under Section 564(b)(1) of the Act, 21 U.S.C. section 360bbb-3(b)(1), unless the authorization is terminated or revoked.  Performed at [facility], [place] [address], [place], [place] [address]    WBC 05/30/2022 7.8  4.0 - 10.5 K/uL Final   RBC 05/30/2022 4.90  4.22 - 5.81 MIL/uL Final   Hemoglobin 05/30/2022 13.7  13.0 - 17.0 g/dL Final   HCT 05/30/2022 40.9  39.0 - 52.0 % Final   MCV 05/30/2022 83.5  80.0 - 100.0 fL Final   MCH 05/30/2022 28.0  26.0 - 34.0 pg Final   MCHC 05/30/2022 33.5  30.0 - 36.0 g/dL Final   RDW 05/30/2022 13.7  11.5 - 15.5 % Final   Platelets 05/30/2022 313  150 - 400 K/uL Final   nRBC 05/30/2022 0.0  0.0 - 0.2 % Final   Neutrophils Relative % 05/30/2022 66  % Final   Neutro Abs 05/30/2022 5.2  1.7 - 7.7 K/uL Final   Lymphocytes Relative 05/30/2022 25  % Final   Lymphs Abs 05/30/2022 1.9  0.7 - 4.0 K/uL Final   Monocytes Relative 05/30/2022 6  % Final   Monocytes Absolute 05/30/2022 0.5  0.1 - 1.0 K/uL Final   Eosinophils Relative 05/30/2022 2  % Final   Eosinophils Absolute 05/30/2022 0.2  0.0 - 0.5 K/uL Final   Basophils Relative 05/30/2022 1  % Final   Basophils Absolute 05/30/2022 0.1  0.0  - 0.1 K/uL Final   Immature Granulocytes 05/30/2022 0  % Final   Abs Immature Granulocytes 05/30/2022 0.01  0.00 - 0.07 K/uL Final   Performed at [facility], [place] [address] [place], [place] [address]   Sodium 05/30/2022 139  135 - 145 mmol/L Final   Potassium 05/30/2022 3.6  3.5 - 5.1 mmol/L Final   Chloride 05/30/2022 106  98 - 111 mmol/L Final   CO2 05/30/2022 26  22 - 32 mmol/L Final   Glucose, Bld 05/30/2022 98  70 - 99 mg/dL Final   Glucose reference range applies only to samples taken after fasting for at least 8 hours.   BUN 05/30/2022 13  6 - 20 mg/dL Final   Creatinine, Ser 05/30/2022 1.00  0.61 - 1.24 mg/dL Final   Calcium 05/30/2022 9.4  8.9 - 10.3 mg/dL Final   Total  Protein 05/30/2022 7.0  6.5 - 8.1 g/dL Final   Albumin 05/30/2022 4.1  3.5 - 5.0 g/dL Final   AST 05/30/2022 36  15 - 41 U/L Final   ALT 05/30/2022 25  0 - 44 U/L Final   Alkaline Phosphatase 05/30/2022 46  38 - 126 U/L Final   Total Bilirubin 05/30/2022 0.7  0.3 - 1.2 mg/dL Final   GFR, Estimated 05/30/2022 >60  >60 mL/min Final   Comment: (NOTE) Calculated using the CKD-EPI Creatinine Equation (2021)    Anion gap 05/30/2022 7  5 - 15 Final   Performed at [facility], [place] [address] [place], [place] [address]   Hgb A1c MFr Bld 05/30/2022 5.2  4.8 - 5.6 % Final   Comment: (NOTE) Pre diabetes:          5.7%-6.4%  Diabetes:              >6.4%  Glycemic control for   <7.0% adults with diabetes    Mean Plasma Glucose 05/30/2022 102.54  mg/dL Final   Performed at [facility], [place] [address] [place], [place] [address]   Cholesterol 05/30/2022 188  0 - 200 mg/dL Final   Triglycerides 05/30/2022 48  <150 mg/dL Final   HDL 05/30/2022 88  >40 mg/dL Final   Total CHOL/HDL Ratio 05/30/2022 2.1  RATIO Final   VLDL 05/30/2022 10  0 - 40 mg/dL Final   LDL Cholesterol 05/30/2022 90  0 - 99 mg/dL Final   Comment:        Total Cholesterol/HDL:CHD Risk Coronary Heart Disease Risk Table                      Men   Women  1/2 Average Risk   3.4   3.3  Average Risk       5.0   4.4  2 X Average Risk   9.6   7.1  3 X Average Risk  23.4   11.0        Use the calculated Patient Ratio above and the CHD Risk Table to determine the patient's CHD Risk.        ATP III CLASSIFICATION (LDL):  <100     mg/dL   Optimal  100-129  mg/dL   Near or Above                    Optimal  130-159  mg/dL   Borderline  160-189  mg/dL   High  >190     mg/dL   Very High Performed at [facility] [address], [place], [place] [address]    TSH 05/30/2022 0.753  0.350 - 4.500 uIU/mL Final   Comment: Performed by a 3rd Generation assay with a functional sensitivity of <=0.01 uIU/mL. Performed at [facility], [place] [address] [place], [place] [address]    POC Amphetamine UR 05/30/2022 Positive (A)  NONE DETECTED (Cut Off Level 1000 ng/mL) Final   POC Secobarbital (BAR) 05/30/2022 None Detected  NONE DETECTED (Cut Off Level 300 ng/mL) Final   POC Buprenorphine (BUP) 05/30/2022 None Detected  NONE DETECTED (Cut Off Level 10 ng/mL) Final   POC Oxazepam (BZO) 05/30/2022 None Detected  NONE DETECTED (Cut Off Level 300 ng/mL) Final   POC Cocaine UR 05/30/2022 Positive (A)  NONE DETECTED (Cut Off Level 300 ng/mL) Final   POC Methamphetamine UR 05/30/2022 Positive (A)  NONE DETECTED (Cut Off Level 1000 ng/mL)  Final   POC Morphine 05/30/2022 None Detected  NONE DETECTED (Cut Off Level 300 ng/mL) Final   POC Methadone UR 05/30/2022 None Detected  NONE DETECTED (Cut Off Level 300 ng/mL) Final   POC Oxycodone UR 05/30/2022 None Detected  NONE DETECTED (Cut Off Level 100 ng/mL) Final   POC Marijuana UR 05/30/2022 Positive (A)  NONE DETECTED (Cut Off Level 50 ng/mL) Final   SARSCOV2ONAVIRUS 2 AG 05/30/2022 NEGATIVE  NEGATIVE Final   Comment: (NOTE) SARS-CoV-2 antigen NOT DETECTED.   Negative results are presumptive.  Negative results do not preclude SARS-CoV-2 infection and should not be used as the  sole basis for treatment or other patient management decisions, including infection  control decisions, particularly in the presence of clinical signs and  symptoms consistent with COVID-19, or in those who have been in contact with the virus.  Negative results must be combined with clinical observations, patient history, and epidemiological information. The expected result is Negative.  Fact Sheet for Patients: HandmadeRecipes.com.cy  Fact Sheet for Healthcare Providers: FuneralLife.at  This test is not yet approved or cleared by the [place] FDA and  has been authorized for detection and/or diagnosis of SARS-CoV-2 by FDA under an Emergency Use Authorization (EUA).  This EUA will remain in effect (meaning this test can be used) for the duration of  the COV                          ID-19 declaration under Section 564(b)(1) of the Act, 21 U.S.C. section 360bbb-3(b)(1), unless the authorization is terminated or revoked sooner.    Admission on 05/21/2022, Discharged on 05/21/2022  Component Date Value Ref Range Status   Glucose-Capillary 05/21/2022 105 (H)  70 - 99 mg/dL Final   Glucose reference range applies only to samples taken after fasting for at least 8 hours.  Admission on 05/20/2022, Discharged on 05/21/2022  Component Date Value Ref Range Status   WBC 05/20/2022 10.8 (H)  4.0 - 10.5 K/uL Final   RBC 05/20/2022 4.79  4.22 - 5.81 MIL/uL Final   Hemoglobin 05/20/2022 13.4  13.0 - 17.0 g/dL Final   HCT 05/20/2022 40.1  39.0 - 52.0 % Final   MCV 05/20/2022 83.7  80.0 - 100.0 fL Final   MCH 05/20/2022 28.0  26.0 - 34.0 pg Final   MCHC 05/20/2022 33.4  30.0 - 36.0 g/dL Final   RDW 05/20/2022 13.6  11.5 - 15.5 % Final   Platelets 05/20/2022 299  150 - 400 K/uL Final   nRBC 05/20/2022 0.0  0.0 - 0.2 % Final   Neutrophils Relative % 05/20/2022 71  % Final   Neutro Abs 05/20/2022 7.7  1.7 - 7.7 K/uL Final   Lymphocytes Relative  05/20/2022 19  % Final   Lymphs Abs 05/20/2022 2.1  0.7 - 4.0 K/uL Final   Monocytes Relative 05/20/2022 8  % Final   Monocytes Absolute 05/20/2022 0.8  0.1 - 1.0 K/uL Final   Eosinophils Relative 05/20/2022 1  % Final   Eosinophils Absolute 05/20/2022 0.1  0.0 - 0.5 K/uL Final   Basophils Relative 05/20/2022 1  % Final   Basophils Absolute 05/20/2022 0.1  0.0 - 0.1 K/uL Final   Immature Granulocytes 05/20/2022 0  % Final   Abs Immature Granulocytes 05/20/2022 0.03  0.00 - 0.07 K/uL Final   Performed at [facility], [place] [address] [place], [place] [address]   Sodium 05/20/2022 134 (L)  135 - 145  mmol/L Final   Potassium 05/20/2022 3.6  3.5 - 5.1 mmol/L Final   Chloride 05/20/2022 104  98 - 111 mmol/L Final   CO2 05/20/2022 21 (L)  22 - 32 mmol/L Final   Glucose, Bld 05/20/2022 213 (H)  70 - 99 mg/dL Final   Glucose reference range applies only to samples taken after fasting for at least 8 hours.   BUN 05/20/2022 19  6 - 20 mg/dL Final   Creatinine, Ser 05/20/2022 1.18  0.61 - 1.24 mg/dL Final   Calcium 05/20/2022 9.0  8.9 - 10.3 mg/dL Final   Total Protein 05/20/2022 6.8  6.5 - 8.1 g/dL Final   Albumin 05/20/2022 4.1  3.5 - 5.0 g/dL Final   AST 05/20/2022 40  15 - 41 U/L Final   ALT 05/20/2022 25  0 - 44 U/L Final   Alkaline Phosphatase 05/20/2022 45  38 - 126 U/L Final   Total Bilirubin 05/20/2022 0.7  0.3 - 1.2 mg/dL Final   GFR, Estimated 05/20/2022 >60  >60 mL/min Final   Comment: (NOTE) Calculated using the CKD-EPI Creatinine Equation (2021)    Anion gap 05/20/2022 9  5 - 15 Final   Performed at [facility] [address] [place], [place] [address]   Alcohol, Ethyl (B) 05/20/2022 <10  <10 mg/dL Final   Comment: (NOTE) Lowest detectable limit for serum alcohol is 10 mg/dL.  For medical purposes only. Performed at [facility], [place] [address], [place], [place] [address]    Salicylate Lvl 71/24/5809 <7.0 (L)  7.0 - 30.0 mg/dL Final   Performed at  [facility] [address] [place], [place] [address]   Acetaminophen (Tylenol), Serum 05/20/2022 <10 (L)  10 - 30 ug/mL Final   Comment: (NOTE) Therapeutic concentrations vary significantly. A range of 10-30 ug/mL  may be an effective concentration for many patients. However, some  are best treated at concentrations outside of this range. Acetaminophen concentrations >150 ug/mL at 4 hours after ingestion  and >50 ug/mL at 12 hours after ingestion are often associated with  toxic reactions.  Performed at [facility], [place] [address] [place], [place] [address]   Admission on 05/14/2022, Discharged on 05/14/2022  Component Date Value Ref Range Status   Sodium 05/14/2022 139  135 - 145 mmol/L Final   Potassium 05/14/2022 3.8  3.5 - 5.1 mmol/L Final   Chloride 05/14/2022 107  98 - 111 mmol/L Final   CO2 05/14/2022 23  22 - 32 mmol/L Final   Glucose, Bld 05/14/2022 76  70 - 99 mg/dL Final   Glucose reference range applies only to samples taken after fasting for at least 8 hours.   BUN 05/14/2022 27 (H)  6 - 20 mg/dL Final   Creatinine, Ser 05/14/2022 0.99  0.61 - 1.24 mg/dL Final   Calcium 05/14/2022 9.0  8.9 - 10.3 mg/dL Final   GFR, Estimated 05/14/2022 >60  >60 mL/min Final   Comment: (NOTE) Calculated using the CKD-EPI Creatinine Equation (2021)    Anion gap 05/14/2022 9  5 - 15 Final   Performed at [facility], [place] [address] [place], [place] [address]   WBC 05/14/2022 7.7  4.0 - 10.5 K/uL Final   RBC 05/14/2022 4.50  4.22 - 5.81 MIL/uL Final   Hemoglobin 05/14/2022 13.0  13.0 - 17.0 g/dL Final   HCT 05/14/2022 38.0 (L)  39.0 - 52.0 % Final   MCV 05/14/2022 84.4  80.0 -  100.0 fL Final   MCH 05/14/2022 28.9  26.0 - 34.0 pg Final   MCHC 05/14/2022 34.2  30.0 - 36.0 g/dL Final   RDW 05/14/2022 13.8  11.5 - 15.5 % Final   Platelets 05/14/2022 285  150 - 400 K/uL Final   nRBC 05/14/2022 0.0  0.0 - 0.2 % Final   Performed at [facility], [place] [address], [place], [place] [address]   Color, Urine 05/14/2022 YELLOW  YELLOW Final   APPearance 05/14/2022 HAZY (A)  CLEAR Final   Specific Gravity, Urine 05/14/2022 1.029  1.005 - 1.030 Final   pH 05/14/2022 6.0  5.0 - 8.0 Final   Glucose, UA 05/14/2022 NEGATIVE  NEGATIVE mg/dL Final   Hgb urine dipstick 05/14/2022 NEGATIVE  NEGATIVE Final   Bilirubin Urine 05/14/2022 NEGATIVE  NEGATIVE Final   Ketones, ur 05/14/2022 NEGATIVE  NEGATIVE mg/dL Final   Protein, ur 05/14/2022 NEGATIVE  NEGATIVE mg/dL Final   Nitrite 05/14/2022 NEGATIVE  NEGATIVE Final   Leukocytes,Ua 05/14/2022 MODERATE (A)  NEGATIVE Final   RBC / HPF 05/14/2022 6-10  0 - 5 RBC/hpf Final   WBC, UA 05/14/2022 >50 (H)  0 - 5 WBC/hpf Final   Bacteria, UA 05/14/2022 NONE SEEN  NONE SEEN Final   Squamous Epithelial / LPF 05/14/2022 0-5  0 - 5 Final   Mucus 05/14/2022 PRESENT   Final   Performed at [facility], [place] [address], [place], [place] [address]   Glucose-Capillary 05/14/2022 107 (H)  70 - 99 mg/dL Final   Glucose reference range applies only to samples taken after fasting for at least 8 hours.   Troponin I (High Sensitivity) 05/14/2022 4  <18 ng/L Final   Comment: (NOTE) Elevated high sensitivity troponin I (hsTnI) values and significant  changes across serial measurements may suggest ACS but many other  chronic and acute conditions are known to elevate hsTnI results.  Refer to the "Links" section for chest pain algorithms and additional  guidance. Performed at [facility], [place] [address] [place], [place] [address]    Acetaminophen (Tylenol), Serum 05/14/2022 <10 (L)  10 - 30 ug/mL Final   Performed at [facility], [place] [address], [place], [place] [address]   Salicylate Lvl 38/46/6599 <7.0 (L)  7.0 - 30.0 mg/dL Final   Performed at [place] [address], [place], [place] [address]   Opiates 05/14/2022 NONE  DETECTED  NONE DETECTED Final   Cocaine 05/14/2022 POSITIVE (A)  NONE DETECTED Final   Benzodiazepines 05/14/2022 NONE DETECTED  NONE DETECTED Final   Amphetamines 05/14/2022 NONE DETECTED  NONE DETECTED Final   Tetrahydrocannabinol 05/14/2022 POSITIVE (A)  NONE DETECTED Final   Barbiturates 05/14/2022 NONE DETECTED  NONE DETECTED Final   Comment: (NOTE) DRUG SCREEN FOR MEDICAL PURPOSES ONLY.  IF CONFIRMATION IS NEEDED FOR ANY PURPOSE, NOTIFY LAB WITHIN 5 DAYS.  LOWEST DETECTABLE LIMITS FOR URINE DRUG SCREEN Drug Class                     Cutoff (ng/mL) Amphetamine and metabolites    1000 Barbiturate and metabolites    200 Benzodiazepine                 779 Tricyclics and metabolites     300 Opiates and metabolites        300 Cocaine and metabolites        300 THC  50 Performed at [facility], [place] [address], [place], [place] [address]   Admission on 05/04/2022, Discharged on 05/04/2022  Component Date Value Ref Range Status   Sodium 05/04/2022 142  135 - 145 mmol/L Final   Potassium 05/04/2022 3.9  3.5 - 5.1 mmol/L Final   Chloride 05/04/2022 109  98 - 111 mmol/L Final   CO2 05/04/2022 27  22 - 32 mmol/L Final   Glucose, Bld 05/04/2022 66 (L)  70 - 99 mg/dL Final   Glucose reference range applies only to samples taken after fasting for at least 8 hours.   BUN 05/04/2022 17  6 - 20 mg/dL Final   Creatinine, Ser 05/04/2022 1.22  0.61 - 1.24 mg/dL Final   Calcium 05/04/2022 9.2  8.9 - 10.3 mg/dL Final   GFR, Estimated 05/04/2022 >60  >60 mL/min Final   Comment: (NOTE) Calculated using the CKD-EPI Creatinine Equation (2021)    Anion gap 05/04/2022 6  5 - 15 Final   Performed at [facility], [place] [address], [place], [place] [address]   WBC 05/04/2022 10.0  4.0 - 10.5 K/uL Final   RBC 05/04/2022 4.75  4.22 - 5.81 MIL/uL Final   Hemoglobin 05/04/2022 13.5  13.0 - 17.0 g/dL Final   HCT 05/04/2022 40.6  39.0 - 52.0 % Final    MCV 05/04/2022 85.5  80.0 - 100.0 fL Final   MCH 05/04/2022 28.4  26.0 - 34.0 pg Final   MCHC 05/04/2022 33.3  30.0 - 36.0 g/dL Final   RDW 05/04/2022 14.0  11.5 - 15.5 % Final   Platelets 05/04/2022 325  150 - 400 K/uL Final   nRBC 05/04/2022 0.0  0.0 - 0.2 % Final   Performed at [place] [address] [place], [place] [address]   Troponin I (High Sensitivity) 05/04/2022 4  <18 ng/L Final   Comment: (NOTE) Elevated high sensitivity troponin I (hsTnI) values and significant  changes across serial measurements may suggest ACS but many other  chronic and acute conditions are known to elevate hsTnI results.  Refer to the "Links" section for chest pain algorithms and additional  guidance. Performed at [facility], [place] [address] [place], [place] [address]    Troponin I (High Sensitivity) 05/04/2022 4  <18 ng/L Final   Comment: (NOTE) Elevated high sensitivity troponin I (hsTnI) values and significant  changes across serial measurements may suggest ACS but many other  chronic and acute conditions are known to elevate hsTnI results.  Refer to the "Links" section for chest pain algorithms and additional  guidance. Performed at [facility], [place] [address], [place], [place] [address]    Glucose-Capillary 05/04/2022 143 (H)  70 - 99 mg/dL Final   Glucose reference range applies only to samples taken after fasting for at least 8 hours.  Admission on 05/01/2022, Discharged on 05/01/2022  Component Date Value Ref Range Status   WBC 05/01/2022 8.8  4.0 - 10.5 K/uL Final   RBC 05/01/2022 4.68  4.22 - 5.81 MIL/uL Final   Hemoglobin 05/01/2022 13.3  13.0 - 17.0 g/dL Final   HCT 05/01/2022 39.1  39.0 - 52.0 % Final   MCV 05/01/2022 83.5  80.0 - 100.0 fL Final   MCH 05/01/2022 28.4  26.0 - 34.0 pg Final   MCHC 05/01/2022 34.0  30.0 - 36.0 g/dL Final   RDW 05/01/2022 14.1  11.5 - 15.5 % Final   Platelets 05/01/2022 302  150 -  400 K/uL Final   nRBC 05/01/2022 0.0  0.0 -  0.2 % Final   Performed at [facility], [place] [address], [place], [place] [address]   Sodium 05/01/2022 139  135 - 145 mmol/L Final   Potassium 05/01/2022 3.3 (L)  3.5 - 5.1 mmol/L Final   Chloride 05/01/2022 107  98 - 111 mmol/L Final   CO2 05/01/2022 22  22 - 32 mmol/L Final   Glucose, Bld 05/01/2022 82  70 - 99 mg/dL Final   Glucose reference range applies only to samples taken after fasting for at least 8 hours.   BUN 05/01/2022 12  6 - 20 mg/dL Final   Creatinine, Ser 05/01/2022 0.85  0.61 - 1.24 mg/dL Final   Calcium 05/01/2022 8.8 (L)  8.9 - 10.3 mg/dL Final   GFR, Estimated 05/01/2022 >60  >60 mL/min Final   Comment: (NOTE) Calculated using the CKD-EPI Creatinine Equation (2021)    Anion gap 05/01/2022 10  5 - 15 Final   Performed at [facility], [place] [address] [place], [place] [address]   Salicylate Lvl 65/02/5464 <7.0 (L)  7.0 - 30.0 mg/dL Final   Performed at [place] [address] [place], [place] [address]   Alcohol, Ethyl (B) 05/01/2022 47 (H)  <10 mg/dL Final   Comment: (NOTE) Lowest detectable limit for serum alcohol is 10 mg/dL.  For medical purposes only. Performed at [facility], [place] [address], [place], [place] [address]    SARS Coronavirus 2 by RT PCR 05/01/2022 NEGATIVE  NEGATIVE Final   Comment: (NOTE) SARS-CoV-2 target nucleic acids are NOT DETECTED.  The SARS-CoV-2 RNA is generally detectable in upper respiratory specimens during the acute phase of infection. The lowest concentration of SARS-CoV-2 viral copies this assay can detect is 138 copies/mL. A negative result does not preclude SARS-Cov-2 infection and should not be used as the sole basis for treatment or other patient management decisions. A negative result may occur with  improper specimen collection/handling, submission of specimen other than nasopharyngeal swab, presence of viral mutation(s) within the areas targeted by this assay, and inadequate number of  viral copies(<138 copies/mL). A negative result must be combined with clinical observations, patient history, and epidemiological information. The expected result is Negative.  Fact Sheet for Patients:  EntrepreneurPulse.com.au  Fact Sheet for Healthcare Providers:  IncredibleEmployment.be  This test is no                          t yet approved or cleared by the [place] FDA and  has been authorized for detection and/or diagnosis of SARS-CoV-2 by FDA under an Emergency Use Authorization (EUA). This EUA will remain  in effect (meaning this test can be used) for the duration of the COVID-19 declaration under Section 564(b)(1) of the Act, 21 U.S.C.section 360bbb-3(b)(1), unless the authorization is terminated  or revoked sooner.       Influenza A by PCR 05/01/2022 NEGATIVE  NEGATIVE Final   Influenza B by PCR 05/01/2022 NEGATIVE  NEGATIVE Final   Comment: (NOTE) The Xpert Xpress SARS-CoV-2/FLU/RSV plus assay is intended as an aid in the diagnosis of influenza from Nasopharyngeal swab specimens and should not be used as a sole basis for treatment. Nasal washings and aspirates are unacceptable for Xpert Xpress SARS-CoV-2/FLU/RSV testing.  Fact Sheet for Patients: EntrepreneurPulse.com.au  Fact Sheet for Healthcare Providers: IncredibleEmployment.be  This test is not yet approved or cleared by  the [employer] and has been authorized for detection and/or diagnosis of SARS-CoV-2 by FDA under an Emergency Use Authorization (EUA). This EUA will remain in effect (meaning this test can be used) for the duration of the COVID-19 declaration under Section 564(b)(1) of the Act, 21 U.S.C. section 360bbb-3(b)(1), unless the authorization is terminated or revoked.  Performed at [facility], [place] [address] [place], [place] [address]   Admission on 04/28/2022, Discharged on 04/28/2022  Component Date  Value Ref Range Status   Sodium 04/28/2022 137  135 - 145 mmol/L Final   Potassium 04/28/2022 3.5  3.5 - 5.1 mmol/L Final   Chloride 04/28/2022 108  98 - 111 mmol/L Final   CO2 04/28/2022 22  22 - 32 mmol/L Final   Glucose, Bld 04/28/2022 93  70 - 99 mg/dL Final   Glucose reference range applies only to samples taken after fasting for at least 8 hours.   BUN 04/28/2022 14  6 - 20 mg/dL Final   Creatinine, Ser 04/28/2022 0.88  0.61 - 1.24 mg/dL Final   Calcium 04/28/2022 8.7 (L)  8.9 - 10.3 mg/dL Final   Total Protein 04/28/2022 6.3 (L)  6.5 - 8.1 g/dL Final   Albumin 04/28/2022 3.7  3.5 - 5.0 g/dL Final   AST 04/28/2022 27  15 - 41 U/L Final   ALT 04/28/2022 17  0 - 44 U/L Final   Alkaline Phosphatase 04/28/2022 50  38 - 126 U/L Final   Total Bilirubin 04/28/2022 0.4  0.3 - 1.2 mg/dL Final   GFR, Estimated 04/28/2022 >60  >60 mL/min Final   Comment: (NOTE) Calculated using the CKD-EPI Creatinine Equation (2021)    Anion gap 04/28/2022 7  5 - 15 Final   Performed at [place] [address], [place], [place] [address]   Alcohol, Ethyl (B) 04/28/2022 71 (H)  <10 mg/dL Final   Comment: (NOTE) Lowest detectable limit for serum alcohol is 10 mg/dL.  For medical purposes only. Performed at [facility], [place] [address], [place], [place] [address]    Salicylate Lvl 44/31/5400 <7.0 (L)  7.0 - 30.0 mg/dL Final   Performed at [place] [address] [place], [place] [address]   Acetaminophen (Tylenol), Serum 04/28/2022 <10 (L)  10 - 30 ug/mL Final   Comment: (NOTE) Therapeutic concentrations vary significantly. A range of 10-30 ug/mL  may be an effective concentration for many patients. However, some  are best treated at concentrations outside of this range. Acetaminophen concentrations >150 ug/mL at 4 hours after ingestion  and >50 ug/mL at 12 hours after ingestion are often associated with  toxic reactions.  Performed at [facility], [place] [address], [place], [place] [address]    WBC 04/28/2022 10.8 (H)  4.0 - 10.5 K/uL Final   RBC 04/28/2022 4.40  4.22 - 5.81 MIL/uL Final   Hemoglobin 04/28/2022 12.5 (L)  13.0 - 17.0 g/dL Final   HCT 04/28/2022 37.3 (L)  39.0 - 52.0 % Final   MCV 04/28/2022 84.8  80.0 - 100.0 fL Final   MCH 04/28/2022 28.4  26.0 - 34.0 pg Final   MCHC 04/28/2022 33.5  30.0 - 36.0 g/dL Final   RDW 04/28/2022 14.0  11.5 - 15.5 % Final   Platelets 04/28/2022 288  150 - 400 K/uL Final   nRBC 04/28/2022 0.0  0.0 - 0.2 % Final   Performed at [place] [address],  [place], [place] [address]   Opiates 04/28/2022 NONE DETECTED  NONE DETECTED Final   Cocaine 04/28/2022 POSITIVE (A)  NONE DETECTED Final   Benzodiazepines 04/28/2022 NONE DETECTED  NONE DETECTED Final   Amphetamines 04/28/2022 NONE DETECTED  NONE DETECTED Final   Tetrahydrocannabinol 04/28/2022 POSITIVE (A)  NONE DETECTED Final   Barbiturates 04/28/2022 NONE DETECTED  NONE DETECTED Final   Comment: (NOTE) DRUG SCREEN FOR MEDICAL PURPOSES ONLY.  IF CONFIRMATION IS NEEDED FOR ANY PURPOSE, NOTIFY LAB WITHIN 5 DAYS.  LOWEST DETECTABLE LIMITS FOR URINE DRUG SCREEN Drug Class                     Cutoff (ng/mL) Amphetamine and metabolites    1000 Barbiturate and metabolites    200 Benzodiazepine                 854 Tricyclics and metabolites     300 Opiates and metabolites        300 Cocaine and metabolites        300 THC                            50 Performed at [facility], [place] [address] [place], [place] [address]    SARS Coronavirus 2 by RT PCR 04/28/2022 NEGATIVE  NEGATIVE Final   Comment: (NOTE) SARS-CoV-2 target nucleic acids are NOT DETECTED.  The SARS-CoV-2 RNA is generally detectable in upper respiratory specimens during the acute phase of infection. The lowest concentration of SARS-CoV-2 viral copies this assay can detect is 138 copies/mL. A negative result does not preclude SARS-Cov-2 infection and should not be used  as the sole basis for treatment or other patient management decisions. A negative result may occur with  improper specimen collection/handling, submission of specimen other than nasopharyngeal swab, presence of viral mutation(s) within the areas targeted by this assay, and inadequate number of viral copies(<138 copies/mL). A negative result must be combined with clinical observations, patient history, and epidemiological information. The expected result is Negative.  Fact Sheet for Patients:  EntrepreneurPulse.com.au  Fact Sheet for Healthcare Providers:  IncredibleEmployment.be  This test is no                          t yet approved or cleared by the [place] FDA and  has been authorized for detection and/or diagnosis of SARS-CoV-2 by FDA under an Emergency Use Authorization (EUA). This EUA will remain  in effect (meaning this test can be used) for the duration of the COVID-19 declaration under Section 564(b)(1) of the Act, 21 U.S.C.section 360bbb-3(b)(1), unless the authorization is terminated  or revoked sooner.       Influenza A by PCR 04/28/2022 NEGATIVE  NEGATIVE Final   Influenza B by PCR 04/28/2022 NEGATIVE  NEGATIVE Final   Comment: (NOTE) The Xpert Xpress SARS-CoV-2/FLU/RSV plus assay is intended as an aid in the diagnosis of influenza from Nasopharyngeal swab specimens and should not be used as a sole basis for treatment. Nasal washings and aspirates are unacceptable for Xpert Xpress SARS-CoV-2/FLU/RSV testing.  Fact Sheet for Patients: EntrepreneurPulse.com.au  Fact Sheet for Healthcare Providers: IncredibleEmployment.be  This test is not yet approved or cleared by the [place] FDA and has been authorized for detection and/or diagnosis of SARS-CoV-2 by FDA under an Emergency Use Authorization (EUA). This EUA will remain in effect (meaning this test can be used) for the  duration of  the COVID-19 declaration under Section 564(b)(1) of the Act, 21 U.S.C. section 360bbb-3(b)(1), unless the authorization is terminated or revoked.  Performed at [facility], [place] [address] [place], [place] [address]   Admission on 04/17/2022, Discharged on 04/17/2022  Component Date Value Ref Range Status   Sodium 04/17/2022 139  135 - 145 mmol/L Final   Potassium 04/17/2022 4.8  3.5 - 5.1 mmol/L Final   Chloride 04/17/2022 102  98 - 111 mmol/L Final   CO2 04/17/2022 20 (L)  22 - 32 mmol/L Final   Glucose, Bld 04/17/2022 198 (H)  70 - 99 mg/dL Final   Glucose reference range applies only to samples taken after fasting for at least 8 hours.   BUN 04/17/2022 24 (H)  6 - 20 mg/dL Final   Creatinine, Ser 04/17/2022 1.43 (H)  0.61 - 1.24 mg/dL Final   Calcium 04/17/2022 9.5  8.9 - 10.3 mg/dL Final   Total Protein 04/17/2022 7.8  6.5 - 8.1 g/dL Final   Albumin 04/17/2022 4.5  3.5 - 5.0 g/dL Final   AST 04/17/2022 40  15 - 41 U/L Final   ALT 04/17/2022 27  0 - 44 U/L Final   Alkaline Phosphatase 04/17/2022 63  38 - 126 U/L Final   Total Bilirubin 04/17/2022 0.6  0.3 - 1.2 mg/dL Final   GFR, Estimated 04/17/2022 >60  >60 mL/min Final   Comment: (NOTE) Calculated using the CKD-EPI Creatinine Equation (2021)    Anion gap 04/17/2022 17 (H)  5 - 15 Final   Performed at [place] [address], [place], [place] [address]   WBC 04/17/2022 26.8 (H)  4.0 - 10.5 K/uL Final   RBC 04/17/2022 5.69  4.22 - 5.81 MIL/uL Final   Hemoglobin 04/17/2022 15.7  13.0 - 17.0 g/dL Final   HCT 04/17/2022 49.0  39.0 - 52.0 % Final   MCV 04/17/2022 86.1  80.0 - 100.0 fL Final   MCH 04/17/2022 27.6  26.0 - 34.0 pg Final   MCHC 04/17/2022 32.0  30.0 - 36.0 g/dL Final   RDW 04/17/2022 14.4  11.5 - 15.5 % Final   Platelets 04/17/2022 143 (L)  150 - 400 K/uL Final   REPEATED TO VERIFY   nRBC 04/17/2022 0.0  0.0 - 0.2 % Final   Neutrophils Relative % 04/17/2022 88  % Final   Neutro Abs 04/17/2022  23.4 (H)  1.7 - 7.7 K/uL Final   Lymphocytes Relative 04/17/2022 5  % Final   Lymphs Abs 04/17/2022 1.4  0.7 - 4.0 K/uL Final   Monocytes Relative 04/17/2022 6  % Final   Monocytes Absolute 04/17/2022 1.6 (H)  0.1 - 1.0 K/uL Final   Eosinophils Relative 04/17/2022 0  % Final   Eosinophils Absolute 04/17/2022 0.1  0.0 - 0.5 K/uL Final   Basophils Relative 04/17/2022 0  % Final   Basophils Absolute 04/17/2022 0.1  0.0 - 0.1 K/uL Final   Immature Granulocytes 04/17/2022 1  % Final   Abs Immature Granulocytes 04/17/2022 0.27 (H)  0.00 - 0.07 K/uL Final   Performed at [facility], [place] [address] [place], [place] [address]   Sodium 04/17/2022 137  135 - 145 mmol/L Final   Potassium 04/17/2022 5.0  3.5 - 5.1 mmol/L Final   Chloride 04/17/2022 102  98 - 111 mmol/L Final   BUN 04/17/2022 32 (H)  6 - 20 mg/dL Final   Creatinine, Ser 04/17/2022 1.20  0.61 - 1.24  mg/dL Final   Glucose, Bld 04/17/2022 197 (H)  70 - 99 mg/dL Final   Glucose reference range applies only to samples taken after fasting for at least 8 hours.   Calcium, Ion 04/17/2022 1.00 (L)  1.15 - 1.40 mmol/L Final   TCO2 04/17/2022 29  22 - 32 mmol/L Final   Hemoglobin 04/17/2022 17.3 (H)  13.0 - 17.0 g/dL Final   HCT 04/17/2022 51.0  39.0 - 52.0 % Final   pH, Ven 04/17/2022 7.269  7.25 - 7.43 Final   pCO2, Ven 04/17/2022 58.3  44 - 60 mmHg Final   pO2, Ven 04/17/2022 204 (H)  32 - 45 mmHg Final   Bicarbonate 04/17/2022 26.7  20.0 - 28.0 mmol/L Final   TCO2 04/17/2022 28  22 - 32 mmol/L Final   O2 Saturation 04/17/2022 100  % Final   Acid-base deficit 04/17/2022 2.0  0.0 - 2.0 mmol/L Final   Sodium 04/17/2022 136  135 - 145 mmol/L Final   Potassium 04/17/2022 5.0  3.5 - 5.1 mmol/L Final   Calcium, Ion 04/17/2022 1.05 (L)  1.15 - 1.40 mmol/L Final   HCT 04/17/2022 50.0  39.0 - 52.0 % Final   Hemoglobin 04/17/2022 17.0  13.0 - 17.0 g/dL Final   Sample type 04/17/2022 VENOUS   Final   Alcohol, Ethyl (B) 04/17/2022 <10  <10  mg/dL Final   Comment: (NOTE) Lowest detectable limit for serum alcohol is 10 mg/dL.  For medical purposes only. Performed at [facility], [place] [address] [place], [place] [address]    Salicylate Lvl 57/26/2035 <7.0 (L)  7.0 - 30.0 mg/dL Final   Performed at [place] [address], [place], [place] [address]   Acetaminophen (Tylenol), Serum 04/17/2022 <10 (L)  10 - 30 ug/mL Final   Comment: (NOTE) Therapeutic concentrations vary significantly. A range of 10-30 ug/mL  may be an effective concentration for many patients. However, some  are best treated at concentrations outside of this range. Acetaminophen concentrations >150 ug/mL at 4 hours after ingestion  and >50 ug/mL at 12 hours after ingestion are often associated with  toxic reactions.  Performed at [facility], [place] [address], [place], [place] [address]    Total CK 04/17/2022 343  49 - 397 U/L Final   Performed at [place] [address] [place], [place] [address]  Admission on 04/16/2022, Discharged on 04/16/2022  Component Date Value Ref Range Status   SARS Coronavirus 2 by RT PCR 04/16/2022 NEGATIVE  NEGATIVE Final   Comment: (NOTE) SARS-CoV-2 target nucleic acids are NOT DETECTED.  The SARS-CoV-2 RNA is generally detectable in upper respiratory specimens during the acute phase of infection. The lowest concentration of SARS-CoV-2 viral copies this assay can detect is 138 copies/mL. A negative result does not preclude SARS-Cov-2 infection and should not be used as the sole basis for treatment or other patient management decisions. A negative result may occur with  improper specimen collection/handling, submission of specimen other than nasopharyngeal swab, presence of viral mutation(s) within the areas targeted by this assay, and inadequate number of viral copies(<138 copies/mL). A negative result must be combined with clinical observations, patient history, and  epidemiological information. The expected result is Negative.  Fact Sheet for Patients:  EntrepreneurPulse.com.au  Fact Sheet for Healthcare Providers:  IncredibleEmployment.be  This test is no  t yet approved or cleared by the [place] and  has been authorized for detection and/or diagnosis of SARS-CoV-2 by FDA under an Emergency Use Authorization (EUA). This EUA will remain  in effect (meaning this test can be used) for the duration of the COVID-19 declaration under Section 564(b)(1) of the Act, 21 U.S.C.section 360bbb-3(b)(1), unless the authorization is terminated  or revoked sooner.       Influenza A by PCR 04/16/2022 NEGATIVE  NEGATIVE Final   Influenza B by PCR 04/16/2022 NEGATIVE  NEGATIVE Final   Comment: (NOTE) The Xpert Xpress SARS-CoV-2/FLU/RSV plus assay is intended as an aid in the diagnosis of influenza from Nasopharyngeal swab specimens and should not be used as a sole basis for treatment. Nasal washings and aspirates are unacceptable for Xpert Xpress SARS-CoV-2/FLU/RSV testing.  Fact Sheet for Patients: EntrepreneurPulse.com.au  Fact Sheet for Healthcare Providers: IncredibleEmployment.be  This test is not yet approved or cleared by the [place] FDA and has been authorized for detection and/or diagnosis of SARS-CoV-2 by FDA under an Emergency Use Authorization (EUA). This EUA will remain in effect (meaning this test can be used) for the duration of the COVID-19 declaration under Section 564(b)(1) of the Act, 21 U.S.C. section 360bbb-3(b)(1), unless the authorization is terminated or revoked.  Performed at [facility], [place] [address], [place], [place] [address]    WBC 04/16/2022 10.0  4.0 - 10.5 K/uL Final   RBC 04/16/2022 5.09  4.22 - 5.81 MIL/uL Final   Hemoglobin 04/16/2022 14.3  13.0 - 17.0 g/dL Final   HCT 04/16/2022 42.0  39.0 - 52.0  % Final   MCV 04/16/2022 82.5  80.0 - 100.0 fL Final   MCH 04/16/2022 28.1  26.0 - 34.0 pg Final   MCHC 04/16/2022 34.0  30.0 - 36.0 g/dL Final   RDW 04/16/2022 14.1  11.5 - 15.5 % Final   Platelets 04/16/2022 308  150 - 400 K/uL Final   nRBC 04/16/2022 0.0  0.0 - 0.2 % Final   Neutrophils Relative % 04/16/2022 69  % Final   Neutro Abs 04/16/2022 7.0  1.7 - 7.7 K/uL Final   Lymphocytes Relative 04/16/2022 22  % Final   Lymphs Abs 04/16/2022 2.2  0.7 - 4.0 K/uL Final   Monocytes Relative 04/16/2022 6  % Final   Monocytes Absolute 04/16/2022 0.6  0.1 - 1.0 K/uL Final   Eosinophils Relative 04/16/2022 2  % Final   Eosinophils Absolute 04/16/2022 0.2  0.0 - 0.5 K/uL Final   Basophils Relative 04/16/2022 1  % Final   Basophils Absolute 04/16/2022 0.1  0.0 - 0.1 K/uL Final   Immature Granulocytes 04/16/2022 0  % Final   Abs Immature Granulocytes 04/16/2022 0.02  0.00 - 0.07 K/uL Final   Performed at [facility], [place] [address], [place], [place] [address]   Sodium 04/16/2022 135  135 - 145 mmol/L Final   Potassium 04/16/2022 3.5  3.5 - 5.1 mmol/L Final   Chloride 04/16/2022 97 (L)  98 - 111 mmol/L Final   CO2 04/16/2022 28  22 - 32 mmol/L Final   Glucose, Bld 04/16/2022 79  70 - 99 mg/dL Final   Glucose reference range applies only to samples taken after fasting for at least 8 hours.   BUN 04/16/2022 10  6 - 20 mg/dL Final   Creatinine, Ser 04/16/2022 0.87  0.61 - 1.24 mg/dL Final   Calcium 04/16/2022 9.4  8.9 - 10.3 mg/dL Final   Total  Protein 04/16/2022 7.2  6.5 - 8.1 g/dL Final   Albumin 04/16/2022 4.1  3.5 - 5.0 g/dL Final   AST 04/16/2022 29  15 - 41 U/L Final   ALT 04/16/2022 22  0 - 44 U/L Final   Alkaline Phosphatase 04/16/2022 53  38 - 126 U/L Final   Total Bilirubin 04/16/2022 0.6  0.3 - 1.2 mg/dL Final   GFR, Estimated 04/16/2022 >60  >60 mL/min Final   Comment: (NOTE) Calculated using the CKD-EPI Creatinine Equation (2021)    Anion gap 04/16/2022 10  5 - 15 Final    Performed at [facility], [place] [address], [place], [place] [address]   Hgb A1c MFr Bld 04/16/2022 5.2  4.8 - 5.6 % Final   Comment: (NOTE) Pre diabetes:          5.7%-6.4%  Diabetes:              >6.4%  Glycemic control for   <7.0% adults with diabetes    Mean Plasma Glucose 04/16/2022 102.54  mg/dL Final   Performed at [facility], [place] [address] [place], [place] [address]   Cholesterol 04/16/2022 190  0 - 200 mg/dL Final   Triglycerides 04/16/2022 40  <150 mg/dL Final   HDL 04/16/2022 90  >40 mg/dL Final   Total CHOL/HDL Ratio 04/16/2022 2.1  RATIO Final   VLDL 04/16/2022 8  0 - 40 mg/dL Final   LDL Cholesterol 04/16/2022 92  0 - 99 mg/dL Final   Comment:        Total Cholesterol/HDL:CHD Risk Coronary Heart Disease Risk Table                     Men   Women  1/2 Average Risk   3.4   3.3  Average Risk       5.0   4.4  2 X Average Risk   9.6   7.1  3 X Average Risk  23.4   11.0        Use the calculated Patient Ratio above and the CHD Risk Table to determine the patient's CHD Risk.        ATP III CLASSIFICATION (LDL):  <100     mg/dL   Optimal  100-129  mg/dL   Near or Above                    Optimal  130-159  mg/dL   Borderline  160-189  mg/dL   High  >190     mg/dL   Very High Performed at [place] [facility] [address] [place], [place] [address]    TSH 04/16/2022 1.509  0.350 - 4.500 uIU/mL Final   Comment: Performed by a 3rd Generation assay with a functional sensitivity of <=0.01 uIU/mL. Performed at [facility], [place] [address] [place], [place] [address]    POC Amphetamine UR 04/16/2022 None Detected  NONE DETECTED (Cut Off Level 1000 ng/mL) Final   POC Secobarbital (BAR) 04/16/2022 None Detected  NONE DETECTED (Cut Off Level 300 ng/mL) Final   POC Buprenorphine (BUP) 04/16/2022 None Detected  NONE DETECTED (Cut Off Level 10 ng/mL) Final   POC Oxazepam (BZO) 04/16/2022 None Detected  NONE DETECTED (Cut Off Level 300 ng/mL) Final   POC  Cocaine UR 04/16/2022 Positive (A)  NONE DETECTED (Cut Off Level 300 ng/mL) Final   POC Methamphetamine UR 04/16/2022 None Detected  NONE DETECTED (Cut Off Level 1000  ng/mL) Final   POC Morphine 04/16/2022 None Detected  NONE DETECTED (Cut Off Level 300 ng/mL) Final   POC Oxycodone UR 04/16/2022 None Detected  NONE DETECTED (Cut Off Level 100 ng/mL) Final   POC Methadone UR 04/16/2022 None Detected  NONE DETECTED (Cut Off Level 300 ng/mL) Final   POC Marijuana UR 04/16/2022 None Detected  NONE DETECTED (Cut Off Level 50 ng/mL) Final   SARSCOV2ONAVIRUS 2 AG 04/16/2022 NEGATIVE  NEGATIVE Final   Comment: (NOTE) SARS-CoV-2 antigen NOT DETECTED.   Negative results are presumptive.  Negative results do not preclude SARS-CoV-2 infection and should not be used as the sole basis for treatment or other patient management decisions, including infection  control decisions, particularly in the presence of clinical signs and  symptoms consistent with COVID-19, or in those who have been in contact with the virus.  Negative results must be combined with clinical observations, patient history, and epidemiological information. The expected result is Negative.  Fact Sheet for Patients: HandmadeRecipes.com.cy  Fact Sheet for Healthcare Providers: FuneralLife.at  This test is not yet approved or cleared by the [place] FDA and  has been authorized for detection and/or diagnosis of SARS-CoV-2 by FDA under an Emergency Use Authorization (EUA).  This EUA will remain in effect (meaning this test can be used) for the duration of  the COV                          ID-19 declaration under Section 564(b)(1) of the Act, 21 U.S.C. section 360bbb-3(b)(1), unless the authorization is terminated or revoked sooner.    There may be more visits with results that are not included.    Blood Alcohol level:  Lab Results  Component Value Date   ETH <10 06/05/2022    ETH <10 11/94/1740    Metabolic Disorder Labs: Lab Results  Component Value Date   HGBA1C 5.2 05/30/2022   MPG 102.54 05/30/2022   MPG 102.54 04/16/2022   Lab Results  Component Value Date   PROLACTIN 32.5 (H) 05/30/2018   Lab Results  Component Value Date   CHOL 188 05/30/2022   TRIG 48 05/30/2022   HDL 88 05/30/2022   CHOLHDL 2.1 05/30/2022   VLDL 10 05/30/2022   LDLCALC 90 05/30/2022   LDLCALC 92 04/16/2022    Therapeutic Lab Levels: No results found for: "LITHIUM" Lab Results  Component Value Date   VALPROATE 27 (L) 11/28/2020   No results found for: "CBMZ"  Physical Findings   AIMS    Flowsheet Row Admission (Discharged) from 01/29/2022 in [facility] 500B Admission (Discharged) from 12/12/2021 in [place] 400B Admission (Discharged) from 10/08/2021 in [place] 300B Admission (Discharged) from 04/23/2021 in [facility] 500B Admission (Discharged) from 11/19/2020 in [facility] 500B  AIMS Total Score 0 0 7 0 0      AUDIT    Flowsheet Row Admission (Discharged) from 01/29/2022 in [place] 500B Admission (Discharged) from 12/12/2021 in [facility] 400B Admission (Discharged) from 10/08/2021 in [facility] 300B Admission (Discharged) from 04/23/2021 in [place] 500B Admission (Discharged) from 11/19/2020 in [facility] 500B  Alcohol Use Disorder Identification Test Final Score (AUDIT) 0 0 0 0 0      PHQ2-9    Flowsheet Row ED from 06/05/2022 in [place] [facility]  [place] ED from 03/11/2021 in [facility]  PHQ-2 Total Score 2 6  PHQ-9 Total Score 4 17      Flowsheet Row ED from 06/05/2022 in [facility] ED from 06/04/2022 in [place] DEPT ED from 05/30/2022 in [place] CATEGORY Low Risk Low Risk High Risk        Musculoskeletal  Strength & Muscle Tone: within normal limits Gait & Station: normal Patient leans: N/A  Psychiatric Specialty Exam  Presentation  General Appearance: Appropriate for Environment; Casual; Disheveled  Eye Contact:Fair  Speech:Clear and Coherent; Normal Rate  Speech Volume:Normal  Handedness:Right   Mood and Affect  Mood:-- ("good")  Affect:Appropriate; Congruent   Thought Process  Thought Processes:Coherent; Goal Directed; Linear  Descriptions of Associations:Intact  Orientation:Full (Time, Place and Person)  Thought Content:WDL; Logical  Diagnosis of Schizophrenia or Schizoaffective disorder in past: Yes  Duration of Psychotic Symptoms: Greater than six months   Hallucinations:Hallucinations: None  Ideas of Reference:None  Suicidal Thoughts:Suicidal Thoughts: No  Homicidal Thoughts:Homicidal Thoughts: No   Sensorium  Memory:Immediate Good; Recent Good; Remote Fair  Judgment:Fair  Insight:Fair   Executive Functions  Concentration:Good  Attention Span:Good  [place] of Knowledge:Good  Language:Good   Psychomotor Activity  Psychomotor Activity:Psychomotor Activity: Normal   Assets  Assets:Communication Skills; Desire for Improvement; Resilience   Sleep  Sleep:Sleep: Good   Nutritional Assessment (For OBS and FBC admissions only) Has the patient had a weight loss or gain of 10 pounds or more in the last 3 months?: No Has the patient had a decrease in food intake/or appetite?: No Does the patient have dental problems?: No Does the patient have eating habits or behaviors that may be indicators of an eating disorder including binging or inducing vomiting?: No Has the patient recently lost weight without trying?: 0 Has  the patient been eating poorly because of a decreased appetite?: 0 Malnutrition Screening Tool Score: 0    Physical Exam  Physical Exam Constitutional:      Appearance: Normal appearance. He is normal weight.  HENT:     Head: Normocephalic and atraumatic.  Eyes:     Extraocular Movements: Extraocular movements intact.  Pulmonary:     Effort: Pulmonary effort is normal.  Neurological:     General: No focal deficit present.     Mental Status: He is alert and oriented to person, place, and time.  Psychiatric:        Attention and Perception: Attention and perception normal.        Speech: Speech normal.        Behavior: Behavior normal. Behavior is cooperative.        Thought Content: Thought content normal.    Review of Systems  Constitutional:  Negative for chills and fever.  HENT:  Negative for hearing loss.   Eyes:  Negative for discharge and redness.  Respiratory:  Negative for cough.   Cardiovascular:  Negative for chest pain.  Gastrointestinal:  Negative for abdominal pain.  Musculoskeletal:  Negative for myalgias.  Neurological:  Negative for headaches.  Psychiatric/Behavioral:  Positive for substance abuse. Negative for hallucinations and suicidal ideas. The patient is not nervous/anxious.    Blood pressure 112/73, pulse 66, temperature 98.6 F (37 C), temperature source Oral, resp. rate 18, SpO2 100 %. There is no height or weight on file to calculate BMI.  Treatment Plan Summary: 26 year old male with schizoaffective disorder,  h/o malingering polysubstance abuse who presented to the [facility] behavioral health urgent care on 06/05/2022 requesting assistance with substance use treatment after unintentionally overdosing on fentanyl on 06/04/2022.  UDS + cocaine, oxycodone, marijuana; EtOH negative.  Patient denies SI/HI/AVH today-he received Invega Sustenna 234 mg  on 6/7. Patient has been appropriate while on the unit although has difficulties tended to personal  hygiene.  Patient declines residential rehab services at this time and requests information for outpatient services. Discussed plan with patient for discharge tomorrow-patient I sin agreement.    Schizoaffective disorder -per chart review, patient has historically done well on invega sustenna LAI-consulted with pharmacy who affirmed that last [place] was 04/30/2022. Patient received 234 mg invega sustenna  06/05/2022.  -will need appointment with [place] clinic prior to dc   Polysusbtance abuse (cocaine, oxycodone, marijuana) -patient intially reported interested in substance use treatment; however, now is declines-will need outpatient substance use treatment resources on dc   Dispo: Anticipate dc tomorrow. Will need appointment with [place] clinic scheduled prior to dc.  Ival Bible, MD 06/06/2022 2:02 PM

## 2022-06-06 NOTE — ED Notes (Signed)
Pt asleep in bed. Respirations even and unlabored. Will continue to monitor for safety. ?

## 2022-06-07 DIAGNOSIS — F1914 Other psychoactive substance abuse with psychoactive substance-induced mood disorder: Secondary | ICD-10-CM | POA: Diagnosis not present

## 2022-06-07 DIAGNOSIS — Z5900 Homelessness unspecified: Secondary | ICD-10-CM | POA: Diagnosis not present

## 2022-06-07 DIAGNOSIS — Z20822 Contact with and (suspected) exposure to covid-19: Secondary | ICD-10-CM | POA: Diagnosis not present

## 2022-06-07 NOTE — ED Provider Notes (Addendum)
FBC/OBS ASAP Discharge Summary  Date and Time: 06/07/2022 3:33 PM  Name: Tyler White  MRN:  [id number]   Discharge Diagnoses:  Final diagnoses:  Polysubstance abuse ([place])  Substance induced mood disorder ([place])  Homelessness  Schizoaffective disorder, bipolar type ([place])    Subjective:  Patient seen and chart reviewed-patient has been appropriate with staff and peers on the unit; however, continues to have difficulty attending to personal hygiene.  Patient interviewed in his room this morning.  Patient's mattresses on the floor-patient states the mattress is more comfortable on the floor than on the bed.  Patient denies SI/HI/AVH.  Denies all physical symptoms.  Patient describes his mood as "good".  Patient is agreeable to discharge and requests a bus pass.  Stay Summary:  26 year old male with schizoaffective disorder, h/o malingering polysubstance abuse who presented to the [facility] behavioral health urgent care on 06/05/2022 requesting assistance with substance use treatment after unintentionally overdosing on fentanyl on 06/04/2022.  Patient [facility] crisis [facility]. UDS + cocaine, oxycodone, marijuana; EtOH negative.  Patient received 234 mg [place] shot on 06/05/2022.  Patient initially expressed interest in obtaining substance use treatment; however, patient later declined.  Patient was discharged on 06/07/2022 with resources for outpatient services.  Suspect that there was an element of secondary gain (pt currently undomiciled) to presentation as patient was minimally participatory in groups, slept the majority of admission  and declined assistance with substance use which he initially reported as his reason for presentation.   On my interview, day of discharge,  patient is in NAD, alert, oriented, calm, cooperative, and attentive, with normal affect, speech, and behavior. Objectively, there is no evidence of psychosis/ mania (able to converse coherently,  linear and goal directed thought, no RIS, no distractibility, not pre-occupied, no FOI, etc) nor depression to the point of suicidality (able to concentrate, affect full and reactive, speech normal r/v/t, no psychomotor retardation/agitation, etc).  Overall, patient appears to be at the point, in the absence of inhibiting or disinhibiting symptoms, where he can successfully move to lesser restrictive setting for care.     Total Time spent with patient: 20 minutes  Past Psychiatric History: schizoaffective disorder, malingering, SIPD, polysubstance abuse Past Medical History:  Past Medical History:  Diagnosis Date   Hernia, inguinal, right    Inguinal hernia    right   Psychiatric illness    Schizophrenia ([place])    History reviewed. No pertinent surgical history. Family History:  Family History  Problem Relation Age of Onset   Psychiatric Illness Mother    Hypertension Sister    Family Psychiatric History:  Mother with "mental illness" per chart Social History:  Social History   Substance and Sexual Activity  Alcohol Use Not Currently     Social History   Substance and Sexual Activity  Drug Use Not Currently   Types: Marijuana, Cocaine   Comment: crack/cocaine occasionally    Social History   Socioeconomic History   Marital status: Single    Spouse name: Not on file   Number of children: Not on file   Years of education: 10   Highest education level: Not on file  Occupational History   Not on file  Tobacco Use   Smoking status: Some Days    Packs/day: 0.50    Years: 5.00    Total pack years: 2.50    Types: Cigarettes   Smokeless tobacco: Never  Vaping Use   Vaping Use: Never used  Substance  and Sexual Activity   Alcohol use: Not Currently   Drug use: Not Currently    Types: Marijuana, Cocaine    Comment: crack/cocaine occasionally   Sexual activity: Yes    Birth control/protection: None  Other Topics Concern   Not on file  Social History Narrative    ** Merged History Encounter **       ** Merged History Encounter **       Social Determinants of Health   Financial Resource Strain: Not on file  Food Insecurity: Not on file  Transportation Needs: Not on file  Physical Activity: Not on file  Stress: Not on file  Social Connections: Not on file   SDOH:  SDOH Screenings   Alcohol Screen: Low Risk  (01/29/2022)   Alcohol Screen    Last Alcohol Screening Score (AUDIT): 0  Depression (PHQ2-9): Medium Risk (06/07/2022)   Depression (PHQ2-9)    PHQ-2 Score: 18  Financial Resource Strain: Not on file  Food Insecurity: Not on file  Housing: Not on file  Physical Activity: Not on file  Social Connections: Not on file  Stress: Not on file  Tobacco Use: High Risk (06/05/2022)   Patient History    Smoking Tobacco Use: Some Days    Smokeless Tobacco Use: Never    Passive Exposure: Not on file  Transportation Needs: Not on file    Tobacco Cessation:  Prescription not provided because: n/a  Current Medications:  No current facility-administered medications for this encounter.   Current Outpatient Medications  Medication Sig Dispense Refill   paliperidone (INVEGA SUSTENNA) 234 MG/1.5ML SUSY injection Inject 234 mg into the muscle once for 1 dose. 1.5 mL 0    PTA Medications: (Not in a hospital admission)      06/07/2022    9:00 AM 06/05/2022   10:04 AM 03/11/2021    8:47 PM  Depression screen PHQ 2/9  Decreased Interest '3 1 3  '$ Down, Depressed, Hopeless '3 1 3  '$ PHQ - 2 Score '6 2 6  '$ Altered sleeping 1 0 3  Tired, decreased energy '1 1 1  '$ Change in appetite 0 0 0  Feeling bad or failure about yourself  3 1 0  Trouble concentrating 1 0 3  Moving slowly or fidgety/restless 3 0 3  Suicidal thoughts 3 0 1  PHQ-9 Score '18 4 17  '$ Difficult doing work/chores Somewhat difficult Not difficult at all     Flowsheet Row ED from 06/05/2022 in [facility] ED from 06/04/2022 in [facility]  DEPT ED from 05/30/2022 in [place] CATEGORY Low Risk Low Risk High Risk       Musculoskeletal  Strength & Muscle Tone: within normal limits Gait & Station: normal Patient leans: N/A  Psychiatric Specialty Exam  Presentation  General Appearance: Appropriate for Environment; Casual; Disheveled  Eye Contact:Fair  Speech:Clear and Coherent; Normal Rate  Speech Volume:Normal  Handedness:Right   Mood and Affect  Mood:-- ("better")  Affect:Appropriate; Congruent   Thought Process  Thought Processes:Coherent; Goal Directed; Linear  Descriptions of Associations:Intact  Orientation:Full (Time, Place and Person)  Thought Content:Logical; WDL  Diagnosis of Schizophrenia or Schizoaffective disorder in past: Yes  Duration of Psychotic Symptoms: Greater than six months   Hallucinations:Hallucinations: None  Ideas of Reference:None  Suicidal Thoughts:Suicidal Thoughts: No  Homicidal Thoughts:Homicidal Thoughts: No   Sensorium  Memory:Immediate Good; Recent Good; Remote Poor  Judgment:Fair  Insight:Fair   Executive Functions  Concentration:Good  Attention Span:Good  Recall:Good  Fund of Knowledge:Good  Language:Good   Psychomotor Activity  Psychomotor Activity:Psychomotor Activity: Normal   Assets  Assets:Communication Skills; Desire for Improvement; Resilience   Sleep  Sleep:Sleep: Fair   No data recorded  Physical Exam  Physical Exam Constitutional:      Appearance: Normal appearance. He is normal weight.  HENT:     Head: Normocephalic and atraumatic.  Eyes:     Extraocular Movements: Extraocular movements intact.  Pulmonary:     Effort: Pulmonary effort is normal.  Neurological:     General: No focal deficit present.     Mental Status: He is alert and oriented to person, place, and time.  Psychiatric:        Attention and Perception: Attention and perception normal.        Speech: Speech  normal.        Behavior: Behavior normal. Behavior is cooperative.        Thought Content: Thought content normal.    Review of Systems  Constitutional:  Negative for chills and fever.  HENT:  Negative for hearing loss.   Eyes:  Negative for discharge and redness.  Respiratory:  Negative for cough.   Cardiovascular:  Negative for chest pain.  Gastrointestinal:  Negative for abdominal pain.  Musculoskeletal:  Negative for myalgias.  Neurological:  Negative for headaches.  Psychiatric/Behavioral:  Positive for substance abuse.    Blood pressure 139/77, pulse (!) 105, temperature 98.3 F (36.8 C), temperature source Temporal, resp. rate 20, SpO2 97 %. There is no height or weight on file to calculate BMI.  Demographic Factors:  Male, Adolescent or young adult, Low socioeconomic status, and Unemployed  Loss Factors: Decrease in vocational status, Financial problems/change in socioeconomic status, and homelessness  Historical Factors: Family history of mental illness or substance abuse and Impulsivity  Risk Reduction Factors:   Help seeking, low barrier to seek  help  Continued Clinical Symptoms:  Depression:   Comorbid alcohol abuse/dependence Alcohol/Substance Abuse/Dependencies Previous Psychiatric Diagnoses and Treatments Schizoaffective disorder  Cognitive Features That Contribute To Risk:  Thought constriction (tunnel vision)    Suicide Risk:  Minimal: No identifiable suicidal ideation.  Patients presenting with no risk factors but with morbid ruminations; may be classified as minimal risk based on the severity of the depressive symptoms  Plan Of Care/Follow-up recommendations:  Activity:  as tolerated Diet:  regular Other:     Take all medications as prescribed by his/her mental healthcare provider. Report any adverse effects and or reactions from the medicines to your outpatient provider promptly. Do not engage in alcohol and or illegal drug use while on  prescription medicines. In the event of worsening symptoms, call the crisis hotline, 911 and or go to the nearest ED for appropriate evaluation and treatment of symptoms. follow-up with your primary care provider for your other medical issues, concerns and or health care needs.  Allergies as of 06/07/2022   No Known Allergies      Medication List     TAKE these medications    paliperidone 234 MG/1.5ML injection Commonly known as: INVEGA SUSTENNA Inject 234 mg into the muscle once for 1 dose.       Next LAI due ~7/5. SW was consulted for assistance prior to dc to make appointment in [facility] clinic. See AVS for date of appointment  Disposition: self care  Ival Bible, MD 06/07/2022, 3:33 PM

## 2022-06-07 NOTE — ED Notes (Signed)
Pt ambulatory to bathroom, gait steady, no distress noted.  Monitoring for safety.

## 2022-06-07 NOTE — ED Notes (Signed)
Patient has taken his mattress and put it on the floor . He is resting quietly no S/S of distress , respirations even and unlabored. Will continue to monitor for safety.

## 2022-06-07 NOTE — ED Notes (Signed)
Breakfast provided. Requested for Pt to tie his gown together while out of the room. Pt walking around unit with gown untied and mesh underwear on. Pt pulled the gown together but did not tie the strings together on the gown. Safety maintained and will continue to monitor.

## 2022-06-11 ENCOUNTER — Encounter (HOSPITAL_COMMUNITY): Payer: Self-pay

## 2022-06-11 ENCOUNTER — Other Ambulatory Visit: Payer: Self-pay

## 2022-06-11 ENCOUNTER — Emergency Department (HOSPITAL_COMMUNITY)
Admission: EM | Admit: 2022-06-11 | Discharge: 2022-06-12 | Disposition: A | Discharge status: Home / Self Care | Payer: Medicaid Other | Attending: Student | Admitting: Student

## 2022-06-11 DIAGNOSIS — F1914 Other psychoactive substance abuse with psychoactive substance-induced mood disorder: Secondary | ICD-10-CM | POA: Diagnosis not present

## 2022-06-11 DIAGNOSIS — S30811A Abrasion of abdominal wall, initial encounter: Secondary | ICD-10-CM | POA: Insufficient documentation

## 2022-06-11 DIAGNOSIS — F250 Schizoaffective disorder, bipolar type: Secondary | ICD-10-CM | POA: Insufficient documentation

## 2022-06-11 DIAGNOSIS — S90812A Abrasion, left foot, initial encounter: Secondary | ICD-10-CM | POA: Diagnosis not present

## 2022-06-11 DIAGNOSIS — S3991XA Unspecified injury of abdomen, initial encounter: Secondary | ICD-10-CM | POA: Diagnosis present

## 2022-06-11 DIAGNOSIS — Z20822 Contact with and (suspected) exposure to covid-19: Secondary | ICD-10-CM | POA: Insufficient documentation

## 2022-06-11 DIAGNOSIS — D72829 Elevated white blood cell count, unspecified: Secondary | ICD-10-CM | POA: Diagnosis not present

## 2022-06-11 DIAGNOSIS — R45851 Suicidal ideations: Secondary | ICD-10-CM | POA: Diagnosis not present

## 2022-06-11 DIAGNOSIS — X58XXXA Exposure to other specified factors, initial encounter: Secondary | ICD-10-CM | POA: Diagnosis not present

## 2022-06-11 NOTE — ED Notes (Signed)
Pt n ow endorses SI without plan. Wil not elaborate. Just states he is depressed.

## 2022-06-11 NOTE — ED Provider Triage Note (Signed)
Emergency Medicine Provider Triage Evaluation Note  Tyler White , a 26 y.o. male  was evaluated in triage.  Pt complains of multiple complaints, he had a scratch on his abdomen which she was concerned about, obtained it about 2 days ago unclear how he got it, after further questioning he endorses that he is suicidal, he has been very stressed out and does not want to live, no actual plan of how he would kill himself.  He denies homicidal ideation needs, denies polysubstance abuse.  .  Review of Systems  Positive: Abdominal scratch, suicidal ideations Negative: Homicidal ideations, abdominal pain  Physical Exam  BP (!) 144/96   Pulse 88   Temp 99.3 F (37.4 C) (Oral)   Resp 16   Ht '5\' 8"'$  (1.727 m)   Wt 55 kg   SpO2 96%   BMI 18.44 kg/m  Gen:   Awake, no distress   Resp:  Normal effort  MSK:   Moves extremities without difficulty  Other:    Medical Decision Making  Medically screening exam initiated at 11:59 PM.  Appropriate orders placed.  Tyler White was informed that the remainder of the evaluation will be completed by another provider, this initial triage assessment does not replace that evaluation, and the importance of remaining in the ED until their evaluation is complete.  Presents with suicidal ideations, no active plan, he is here voluntarily, Lab work imaging have been ordered will need further work-up.   Marcello Fennel, PA-C 06/12/22 0000

## 2022-06-11 NOTE — ED Triage Notes (Signed)
Pt denies internal abd pain. Pt states hes only here for a scratch on his abd.

## 2022-06-11 NOTE — ED Triage Notes (Signed)
Pt having 7/10 LUQ abd pain since 2 days ago. Denies NVD. LBM today and normal.

## 2022-06-12 LAB — COMPREHENSIVE METABOLIC PANEL
ALT: 31 U/L (ref 0–44)
AST: 52 U/L — ABNORMAL HIGH (ref 15–41)
Albumin: 4.1 g/dL (ref 3.5–5.0)
Alkaline Phosphatase: 44 U/L (ref 38–126)
Anion gap: 12 (ref 5–15)
BUN: 19 mg/dL (ref 6–20)
CO2: 25 mmol/L (ref 22–32)
Calcium: 9.7 mg/dL (ref 8.9–10.3)
Chloride: 102 mmol/L (ref 98–111)
Creatinine, Ser: 1.1 mg/dL (ref 0.61–1.24)
GFR, Estimated: >60 mL/min (ref >60)
Glucose, Bld: 93 mg/dL (ref 70–99)
Potassium: 3.5 mmol/L (ref 3.5–5.1)
Sodium: 139 mmol/L (ref 135–145)
Total Bilirubin: 1.2 mg/dL (ref 0.3–1.2)
Total Protein: 7 g/dL (ref 6.5–8.1)

## 2022-06-12 LAB — CBC WITH DIFFERENTIAL/PLATELET
Abs Immature Granulocytes: 0.05 10*3/uL (ref 0.00–0.07)
Basophils Absolute: 0.1 10*3/uL (ref 0.0–0.1)
Basophils Relative: 1 %
Eosinophils Absolute: 0 10*3/uL (ref 0.0–0.5)
Eosinophils Relative: 0 %
HCT: 39.1 % (ref 39.0–52.0)
Hemoglobin: 13.6 g/dL (ref 13.0–17.0)
Immature Granulocytes: 0 %
Lymphocytes Relative: 11 %
Lymphs Abs: 1.4 10*3/uL (ref 0.7–4.0)
MCH: 29.1 pg (ref 26.0–34.0)
MCHC: 34.8 g/dL (ref 30.0–36.0)
MCV: 83.5 fL (ref 80.0–100.0)
Monocytes Absolute: 1.1 10*3/uL — ABNORMAL HIGH (ref 0.1–1.0)
Monocytes Relative: 8 %
Neutro Abs: 10.4 10*3/uL — ABNORMAL HIGH (ref 1.7–7.7)
Neutrophils Relative %: 80 %
Platelets: 267 10*3/uL (ref 150–400)
RBC: 4.68 MIL/uL (ref 4.22–5.81)
RDW: 13.3 % (ref 11.5–15.5)
WBC: 13 10*3/uL — ABNORMAL HIGH (ref 4.0–10.5)
nRBC: 0 % (ref 0.0–0.2)

## 2022-06-12 LAB — RAPID URINE DRUG SCREEN, HOSP PERFORMED
Amphetamines: POSITIVE — AB
Barbiturates: NOT DETECTED
Benzodiazepines: NOT DETECTED
Cocaine: POSITIVE — AB
Opiates: NOT DETECTED
Tetrahydrocannabinol: POSITIVE — AB

## 2022-06-12 LAB — RESP PANEL BY RT-PCR (FLU A&B, COVID) ARPGX2
Influenza A by PCR: NEGATIVE
Influenza B by PCR: NEGATIVE
SARS Coronavirus 2 by RT PCR: NEGATIVE

## 2022-06-12 LAB — ETHANOL: Alcohol, Ethyl (B): <10 mg/dL (ref <10)

## 2022-06-12 NOTE — ED Provider Notes (Signed)
[facility] EMERGENCY DEPARTMENT Provider Note   CSN: [id number] Arrival date & time: 06/11/22  2315     History  Chief Complaint  Patient presents with   Suicidal    Tyler White is a 26 y.o. male.  HPI  Medical history including schizophrenia, inguinal hernia presents emerged part with complaints of left abdominal ankle scratch.  States he was scratched on his abdomen 2 days ago unclear of how he got it, states she has some pain in the area but denies any drainage or discharge to it.  Also notices a small abrasion on his left heel again unclear of how he got it denies any drainage or discharge area he is not immunocompromise, he is up-to-date on his tetanus shot.  He also endorses that he has suicidal ideations, states that he wants to kill himself but does not have a plan, states he has been under a lot of stress denies illicit drug use has no other complaints  Home Medications Prior to Admission medications   Medication Sig Start Date End Date Taking? Authorizing Provider  paliperidone (INVEGA SUSTENNA) 234 MG/1.5ML SUSY injection Inject 234 mg into the muscle once for 1 dose. 04/16/22 06/05/22  Tharon Aquas, NP  gabapentin (NEURONTIN) 400 MG capsule Take 1 capsule (400 mg total) by mouth 3 (three) times daily. Patient not taking: Reported on 06/09/2021 04/30/21 06/10/21  Ethelene Hal, NP      Allergies    Patient has no known allergies.    Review of Systems   Review of Systems  Constitutional:  Negative for chills and fever.  Respiratory:  Negative for shortness of breath.   Cardiovascular:  Negative for chest pain.  Gastrointestinal:  Negative for abdominal pain.  Skin:  Positive for wound.  Neurological:  Negative for headaches.  Psychiatric/Behavioral:  Positive for suicidal ideas. Negative for self-injury.     Physical Exam Updated Vital Signs BP (!) 144/96   Pulse 88   Temp 99.3 F (37.4 C) (Oral)   Resp 16   Ht '5\' 8"'$  (1.727  m)   Wt 55 kg   SpO2 96%   BMI 18.44 kg/m  Physical Exam Vitals and nursing note reviewed.  Constitutional:      General: He is not in acute distress.    Appearance: He is not ill-appearing.  HENT:     Head: Normocephalic and atraumatic.     Nose: No congestion.  Eyes:     Conjunctiva/sclera: Conjunctivae normal.  Cardiovascular:     Rate and Rhythm: Normal rate and regular rhythm.     Pulses: Normal pulses.     Heart sounds: No murmur heard.    No friction rub. No gallop.  Pulmonary:     Effort: No respiratory distress.     Breath sounds: No wheezing, rhonchi or rales.  Abdominal:     Palpations: Abdomen is soft.     Tenderness: There is no abdominal tenderness. There is no right CVA tenderness or left CVA tenderness.  Skin:    General: Skin is warm and dry.     Comments: Superficial abrasion noted on his left lower abdomen healing well no evidence of infection present.  Patient is also a small abrasion on his left lower heel again without evidence of infection  Neurological:     Mental Status: He is alert.     Comments: No facial asymmetry no difficulty with word finding following two-step commands no unilateral weakness present.  Psychiatric:  Mood and Affect: Mood normal.     Comments: Responding appropriately to all questions, is not responding to internal stimuli, worsening suicidal ideations without a plan.  Denies homicidal ideation.     ED Results / Procedures / Treatments   Labs (all labs ordered are listed, but only abnormal results are displayed) Labs Reviewed  COMPREHENSIVE METABOLIC PANEL - Abnormal; Notable for the following components:      Result Value   AST 52 (*)    All other components within normal limits  RAPID URINE DRUG SCREEN, HOSP PERFORMED - Abnormal; Notable for the following components:   Cocaine POSITIVE (*)    Amphetamines POSITIVE (*)    Tetrahydrocannabinol POSITIVE (*)    All other components within normal limits  CBC WITH  DIFFERENTIAL/PLATELET - Abnormal; Notable for the following components:   WBC 13.0 (*)    Neutro Abs 10.4 (*)    Monocytes Absolute 1.1 (*)    All other components within normal limits  RESP PANEL BY RT-PCR (FLU A&B, COVID) ARPGX2  ETHANOL    EKG None  Radiology No results found.  Procedures Procedures    Medications Ordered in ED Medications - No data to display  ED Course/ Medical Decision Making/ A&P                           Medical Decision Making Amount and/or Complexity of Data Reviewed Labs: ordered.   This patient presents to the ED for concern of suicidal ideation this involves an extensive number of treatment options, and is a complaint that carries with it a high risk of complications and morbidity.  The differential diagnosis includes metabolic derailments, psychiatric emergency, CVA    Additional history obtained:  Additional history obtained from N/A External records from outside source obtained and reviewed including previous ER notes, behavioral health notes   Co morbidities that complicate the patient evaluation  Schizophrenia  Social Determinants of Health:  Noncompliant    Lab Tests:  I Ordered, and personally interpreted labs.  The pertinent results include: CBC shows leukocytosis white count of 13, CMP shows slight elevation liver enzymes AST 52 ethanol less than 10 respiratory panel negative rapid urine drug seen positive for cocaine amphetamines tetra cannabinoids   Imaging Studies ordered:  I ordered imaging studies including N/A I independently visualized and interpreted imaging which showed N/A I agree with the radiologist interpretation   Cardiac Monitoring:  The patient was maintained on a cardiac monitor.  I personally viewed and interpreted the cardiac monitored which showed an underlying rhythm of: Without signs of ischemia   Medicines ordered and prescription drug management:  I ordered medication including N/A I  have reviewed the patients home medicines and have made adjustments as needed  Critical Interventions:  N/A   Reevaluation:  Presents with suicide ideation without a plan, will obtain medical clearance and reassess  Patient is medically clear at this time Home meds have been ordered if N/A will await TTS recommendations  Consultations Obtained:  TTS recommendations pending    Test Considered:  CT head-deferred my suspicion for intracranial head bleed very low at this time not anticoag's no focal deficits present.    Rule out Low session for systemic infection patient nontoxic-appearing vital signs reassuring does have an elevated white count but likely secondary due to polysubstance dependency.  Low suspicion for metabolic derailments lab work is unremarkable.    Dispostion and problem list  After consideration of the  diagnostic results and the patients response to treatment, I feel that the patent would benefit from psych hold, patient has not IVCD he is here voluntarily, home meds have been ordered if any, will await TTS recommendations.            Final Clinical Impression(s) / ED Diagnoses Final diagnoses:  Suicidal ideation    Rx / DC Orders ED Discharge Orders     None         Marcello Fennel, PA-C 06/12/22 0232    Fatima Blank, MD 06/12/22 [id number]

## 2022-06-12 NOTE — BH Assessment (Signed)
TTS clinician attempted TTS assessment.  0300 Unable to reach RN with multiple attempts. 1642 Per Marcie Bal, RN, patient in hallway unable to locate room at this moment.

## 2022-06-12 NOTE — ED Notes (Signed)
Paperwork and all belongings given back to pt, no valuables locked up. Pt refused DC vitals from this RN, stated he didn't want to "bother with it". Pt ambulated out w/ steady gait, in NAD. Changed into street clothes prior to leaving ED

## 2022-06-12 NOTE — ED Notes (Signed)
Pt eating food in NAD

## 2022-06-12 NOTE — ED Notes (Signed)
Pt wanted by security

## 2022-06-12 NOTE — Discharge Instructions (Signed)
Follow-up per behavioral health recommendations and resources. Return for new concerns.

## 2022-06-12 NOTE — ED Notes (Signed)
Belongings placed in locker 2.  

## 2022-06-12 NOTE — BH Assessment (Signed)
Disposition:   TTS completed. Discussed clinicals with the [facility] provider Delphia Grates Rankin, NP). Patient is psych cleared to discharge and recommended to follow up with ACTT services. Although, referred to ACTT services patient doesn't feel like he needs those services. Therefore, I will note additional outpatient referrals/resources in patient's AVS.   Patient's nurse and EDP provided disposition updates.

## 2022-06-12 NOTE — ED Notes (Signed)
Lunch order placed
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
## 2022-06-15 ENCOUNTER — Other Ambulatory Visit: Payer: Self-pay

## 2022-06-15 ENCOUNTER — Encounter (HOSPITAL_COMMUNITY): Payer: Self-pay

## 2022-06-15 ENCOUNTER — Emergency Department (HOSPITAL_COMMUNITY)
Admission: EM | Admit: 2022-06-15 | Discharge: 2022-06-16 | Disposition: A | Discharge status: Home / Self Care | Payer: Medicaid Other | Attending: Emergency Medicine | Admitting: Emergency Medicine

## 2022-06-15 DIAGNOSIS — F250 Schizoaffective disorder, bipolar type: Secondary | ICD-10-CM | POA: Diagnosis present

## 2022-06-15 DIAGNOSIS — R45851 Suicidal ideations: Secondary | ICD-10-CM | POA: Diagnosis not present

## 2022-06-15 DIAGNOSIS — Z20822 Contact with and (suspected) exposure to covid-19: Secondary | ICD-10-CM | POA: Diagnosis not present

## 2022-06-15 DIAGNOSIS — F1914 Other psychoactive substance abuse with psychoactive substance-induced mood disorder: Secondary | ICD-10-CM | POA: Insufficient documentation

## 2022-06-15 DIAGNOSIS — Z046 Encounter for general psychiatric examination, requested by authority: Secondary | ICD-10-CM | POA: Diagnosis present

## 2022-06-15 DIAGNOSIS — F1910 Other psychoactive substance abuse, uncomplicated: Secondary | ICD-10-CM | POA: Diagnosis present

## 2022-06-15 DIAGNOSIS — F1994 Other psychoactive substance use, unspecified with psychoactive substance-induced mood disorder: Secondary | ICD-10-CM | POA: Diagnosis present

## 2022-06-15 LAB — COMPREHENSIVE METABOLIC PANEL
ALT: 24 U/L (ref 0–44)
AST: 29 U/L (ref 15–41)
Albumin: 4 g/dL (ref 3.5–5.0)
Alkaline Phosphatase: 48 U/L (ref 38–126)
Anion gap: 4 — ABNORMAL LOW (ref 5–15)
BUN: 17 mg/dL (ref 6–20)
CO2: 27 mmol/L (ref 22–32)
Calcium: 9.2 mg/dL (ref 8.9–10.3)
Chloride: 109 mmol/L (ref 98–111)
Creatinine, Ser: 1 mg/dL (ref 0.61–1.24)
GFR, Estimated: >60 mL/min (ref >60)
Glucose, Bld: 74 mg/dL (ref 70–99)
Potassium: 3.6 mmol/L (ref 3.5–5.1)
Sodium: 140 mmol/L (ref 135–145)
Total Bilirubin: 0.5 mg/dL (ref 0.3–1.2)
Total Protein: 6.9 g/dL (ref 6.5–8.1)

## 2022-06-15 LAB — RAPID URINE DRUG SCREEN, HOSP PERFORMED
Amphetamines: POSITIVE — AB
Barbiturates: NOT DETECTED
Benzodiazepines: NOT DETECTED
Cocaine: POSITIVE — AB
Opiates: NOT DETECTED
Tetrahydrocannabinol: POSITIVE — AB

## 2022-06-15 LAB — CBC
HCT: 39.8 % (ref 39.0–52.0)
Hemoglobin: 13.2 g/dL (ref 13.0–17.0)
MCH: 28.6 pg (ref 26.0–34.0)
MCHC: 33.2 g/dL (ref 30.0–36.0)
MCV: 86.1 fL (ref 80.0–100.0)
Platelets: 268 10*3/uL (ref 150–400)
RBC: 4.62 MIL/uL (ref 4.22–5.81)
RDW: 13.2 % (ref 11.5–15.5)
WBC: 7.9 10*3/uL (ref 4.0–10.5)
nRBC: 0 % (ref 0.0–0.2)

## 2022-06-15 LAB — ACETAMINOPHEN LEVEL: Acetaminophen (Tylenol), Serum: <10 ug/mL — ABNORMAL LOW (ref 10–30)

## 2022-06-15 LAB — RESP PANEL BY RT-PCR (FLU A&B, COVID) ARPGX2
Influenza A by PCR: NEGATIVE
Influenza B by PCR: NEGATIVE
SARS Coronavirus 2 by RT PCR: NEGATIVE

## 2022-06-15 LAB — ETHANOL: Alcohol, Ethyl (B): <10 mg/dL (ref <10)

## 2022-06-15 LAB — SALICYLATE LEVEL: Salicylate Lvl: <7 mg/dL — ABNORMAL LOW (ref 7.0–30.0)

## 2022-06-15 MED ORDER — OLANZAPINE 5 MG PO TBDP: 5.0000 mg | ORAL_TABLET | Freq: Three times a day (TID) | ORAL | Status: DC | PRN

## 2022-06-15 MED ORDER — LORAZEPAM 1 MG PO TABS: 1.0000 mg | ORAL_TABLET | ORAL | Status: DC | PRN

## 2022-06-15 MED ORDER — ZIPRASIDONE MESYLATE 20 MG IM SOLR: 20.0000 mg | INTRAMUSCULAR | Status: DC | PRN

## 2022-06-15 NOTE — Progress Notes (Signed)
Transition of Care [facility]) - Emergency Department Mini Assessment   Patient Details  Name: Tyler White MRN: [id number] Date of Birth: 1996/11/03  Transition of Care [facility]) CM/SW Contact:    Taniyah Ballow C Tarpley-Carter, [place] Phone Number: 06/15/2022, 2:39 PM   Clinical Narrative: TOC CSW consulted with pt.  Pt is in need of TTS eval for SI, MD has ordered.  CSW will provide resources to AVS for possible future use.  Lauriann Milillo Tarpley-Carter, MSW, LCSW-A Pronouns:  She/Her/Hers [facility] of Care Clinical Social Worker Direct Number:  [phone] [email or web address]    ED Mini Assessment: What brought you to the Emergency Department? : SI and blister  Barriers to Discharge: No Barriers Identified     Means of departure: Not know       Patient Contact and Communications        ,          Patient states their goals for this hospitalization and ongoing recovery are:: Wants help   Choice offered to / list presented to : NA  Admission diagnosis:  si, blister on ankle Patient Active Problem List   Diagnosis Date Noted   Suicidal thoughts    Malingering 04/05/2022   Noncompliance with medications 04/05/2022   Suicidal ideation 04/05/2022   Stimulant use disorder 02/07/2022   Substance induced mood disorder ([place]) 12/12/2021   Xerosis of skin 10/09/2021   Schizoaffective disorder, bipolar type ([place]) 04/26/2021   Marijuana abuse in remission 04/26/2021   Polysubstance abuse ([place]) 04/23/2021   Homelessness 04/23/2021   Tobacco abuse 01/15/2021   Schizophrenia ([place]) 11/19/2020   Cannabis abuse 04/28/2020   Cocaine abuse ([place]) 04/28/2020   Cocaine abuse, continuous use ([place]) 02/19/2018   Cocaine abuse with cocaine-induced mood disorder ([place]) 02/19/2018   Psychosis ([place]) 11/27/2017   Cannabis abuse with psychotic disorder, with delusions ([place]) 11/26/2017   Schizophrenia, unspecified ([place]) 11/26/2017   Right inguinal hernia  04/06/2013   PCP:  [facility], [facility]:   [place] ([place]), [place] - [address] [place] [address] Phone: [phone] Fax: [place], [place] - [address] [place] [address] Phone: [phone] Fax: [phone]

## 2022-06-15 NOTE — ED Notes (Signed)
Pt is dressed out in scrubs. Pts belongings are in cabinet 23-25. Pt was wanded

## 2022-06-15 NOTE — ED Triage Notes (Signed)
Pt BIB EMS for SI and a blister on his left foot. Pt states he does not have a plan. Pt also has a "scratch" on the abdomen that's been bothering him.  50 hr 128/90 bp 98 cbg

## 2022-06-15 NOTE — ED Provider Notes (Signed)
[facility] DEPT Provider Note   CSN: [id number] Arrival date & time: 06/15/22  0245     History  Chief Complaint  Patient presents with   Suicidal   Blister    Tyler White is a 26 y.o. male.  Patient presents to the emergency department for evaluation of suicidal ideation.  Patient feeling suicidal but does not have an active plan.       Home Medications Prior to Admission medications   Medication Sig Start Date End Date Taking? Authorizing Provider  paliperidone (INVEGA SUSTENNA) 234 MG/1.5ML SUSY injection Inject 234 mg into the muscle once for 1 dose. Patient not taking: Reported on 06/12/2022 04/16/22 06/05/22  Tharon Aquas, NP  gabapentin (NEURONTIN) 400 MG capsule Take 1 capsule (400 mg total) by mouth 3 (three) times daily. Patient not taking: Reported on 06/09/2021 04/30/21 06/10/21  Ethelene Hal, NP      Allergies    Patient has no known allergies.    Review of Systems   Review of Systems  Physical Exam Updated Vital Signs BP 120/90   Pulse 67   Temp 97.6 F (36.4 C) (Oral)   Resp 17   Ht '5\' 8"'$  (1.727 m)   Wt 55 kg   SpO2 97%   BMI 18.44 kg/m  Physical Exam Vitals and nursing note reviewed.  Constitutional:      General: He is not in acute distress.    Appearance: He is well-developed.  HENT:     Head: Normocephalic and atraumatic.     Mouth/Throat:     Mouth: Mucous membranes are moist.  Eyes:     General: Vision grossly intact. Gaze aligned appropriately.     Extraocular Movements: Extraocular movements intact.     Conjunctiva/sclera: Conjunctivae normal.  Cardiovascular:     Rate and Rhythm: Normal rate and regular rhythm.     Pulses: Normal pulses.     Heart sounds: Normal heart sounds, S1 normal and S2 normal. No murmur heard.    No friction rub. No gallop.  Pulmonary:     Effort: Pulmonary effort is normal. No respiratory distress.     Breath sounds: Normal breath sounds.  Abdominal:      Palpations: Abdomen is soft.     Tenderness: There is no abdominal tenderness. There is no guarding or rebound.     Hernia: No hernia is present.  Musculoskeletal:        General: No swelling.     Cervical back: Full passive range of motion without pain, normal range of motion and neck supple. No pain with movement, spinous process tenderness or muscular tenderness. Normal range of motion.     Right lower leg: No edema.     Left lower leg: No edema.  Feet:     Right foot:     Skin integrity: Callus and dry skin present. No ulcer or erythema.     Left foot:     Skin integrity: Callus and dry skin present. No ulcer or erythema.  Skin:    General: Skin is warm and dry.     Capillary Refill: Capillary refill takes less than 2 seconds.     Findings: No ecchymosis, erythema, lesion or wound.  Neurological:     Mental Status: He is alert and oriented to person, place, and time.     GCS: GCS eye subscore is 4. GCS verbal subscore is 5. GCS motor subscore is 6.     Cranial Nerves: Cranial nerves  2-12 are intact.     Sensory: Sensation is intact.     Motor: Motor function is intact. No weakness or abnormal muscle tone.     Coordination: Coordination is intact.  Psychiatric:        Mood and Affect: Mood normal.        Speech: Speech normal.        Behavior: Behavior normal.     ED Results / Procedures / Treatments   Labs (all labs ordered are listed, but only abnormal results are displayed) Labs Reviewed  COMPREHENSIVE METABOLIC PANEL - Abnormal; Notable for the following components:      Result Value   Anion gap 4 (*)    All other components within normal limits  SALICYLATE LEVEL - Abnormal; Notable for the following components:   Salicylate Lvl <0.9 (*)    All other components within normal limits  ACETAMINOPHEN LEVEL - Abnormal; Notable for the following components:   Acetaminophen (Tylenol), Serum <10 (*)    All other components within normal limits  RAPID URINE DRUG SCREEN,  HOSP PERFORMED - Abnormal; Notable for the following components:   Cocaine POSITIVE (*)    Amphetamines POSITIVE (*)    Tetrahydrocannabinol POSITIVE (*)    All other components within normal limits  ETHANOL  CBC    EKG None  Radiology No results found.  Procedures Procedures    Medications Ordered in ED Medications - No data to display  ED Course/ Medical Decision Making/ A&P                           Medical Decision Making Amount and/or Complexity of Data Reviewed Labs: ordered.   Presents with complaints of suicidal ideation.  Patient does not have an active plan.  Patient also reports "blister" on his left foot.  Examination reveals dry skin, peeling and flaking calluses, no signs of infection.        Final Clinical Impression(s) / ED Diagnoses Final diagnoses:  Suicidal ideation    Rx / DC Orders ED Discharge Orders     None         Abisai Deer, Gwenyth Allegra, MD 06/15/22 [phone]

## 2022-06-15 NOTE — Progress Notes (Signed)
Per Suzzanne Cloud, patient meets criteria for inpatient treatment. There are no available beds at [facility] today. CSW faxed referrals to the following facilities for review:  [address] [place] [address] [phone] --  [place] [facility] [address], [place] [address] [phone] --  [address] [place] [address] [phone] --  [place] [address] [place] [address] [phone] --  [place]  Pending - Request Sent N/A [place], [place] [address] [phone] --  [address] [address] [place] [address] [phone] --  [facility]  Pending - Request Sent N/A [address] [place], [place] [address] [phone] --  [place] [address], [place] [address] [phone] --  [facility]  Pending - Request Sent N/A [address] [place] [address] [phone] --  [facility]  Pending - Request Sent N/A [address] [place] [address] [phone] --  [facility]  Pending - Request Sent N/A [address], [place] [address] [phone] --  [facility]  Pending - Request Sent N/A [place], [place] [address] [phone] --  [facility]  Pending - Request Sent N/A [address], [place] [address] [phone] --  [facility]  Pending - Request Sent  N/A [address], [place] [address] [phone] --  [facility]  Pending - Request Sent N/A [address] [place] [address] [phone] --   TTS will continue to seek bed placement.  Glennie [place], MSW, [place] Phone: [phone] Disposition/TOC

## 2022-06-15 NOTE — Consult Note (Signed)
Telepsych Consultation   Reason for Consult:  psych consult Referring Physician:  Orpah Greek, MD Location of Patient:  [facility] Location of Provider: [facility] Department  Patient Identification: Tyler White MRN:  [id number] Principal Diagnosis: Schizoaffective disorder, bipolar type ([place]) Diagnosis:  Principal Problem:   Schizoaffective disorder, bipolar type ([place]) Active Problems:   Polysubstance abuse ([place])   Substance induced mood disorder ([place])   Suicidal ideation   Total Time spent with patient: 30 minutes  Subjective:   Tyler White is a 26 y.o. male patient admitted with suicidal ideation.  "I had like a blister that's pretty open, I have sore on my stomach from possibly hopping the fence". Unable to complete thought; moderate thought blocking noted. Says he's been depressed because the blister has kept him from walking and doing things he normally does. He's alert to person, place, time (year only), and situation; minimal  insight. Paranoid. "I feel like someone is watching me and telling me to stay in one place so they can get me".Thought processes appear concrete; pt recalls highest level of education grade 11, denies any special education. He is endorsing suicidal ideations with auditory and visual hallucinations. No current outpatient services in place. Per chart review, patient received [facility] 05/30/22; recent dispositions mention ACTT services, [facility] and [facility] state no knowledge of patient and patient further states he does not receive any ACTT services. UDS+ amphetamine, cocaine, THC. Gives permission to call brother, mother.   Collateral:  [name] (mother) [phone] "Well I'm in [place] right now so there's not much I can do. I will be back in [place] soon and we will see". Provider attempted to discuss patient's situation regarding homelessness, substance use, mental health, and  non-compliance; mom did not engage further and asked to have son call her. Expressed no availability to assist pt at this time. Asked to have patient call her; per chart review, mother has history of personal psychiatric illness.   HPI:  Tyler White is a 26 year old male patient with past psychiatric history of schizophrenia, polysubstance abuse, schizoaffective disorder bipolar type, substance induced mood disorder, malingering, non-compliance with medications, and suicidal ideations who presented to [facility] with hallucinations. Of note pt has 10 ED encounters throughout the [facility] system with various complaints; discharged with resources, questionable ability to maneuver. UDS+amphetamines, cocaine, and THC. BAL<10.   Past Psychiatric History: schizophrenia, polysubstance abuse, schizoaffective disorder bipolar type, substance induced mood disorder, malingering, non-compliance with medications, and suicidal ideations  Risk to Self:   Risk to Others:   Prior Inpatient Therapy:   Prior Outpatient Therapy:    Past Medical History:  Past Medical History:  Diagnosis Date   Hernia, inguinal, right    Inguinal hernia    right   Psychiatric illness    Schizophrenia ([place])    History reviewed. No pertinent surgical history. Family History:  Family History  Problem Relation Age of Onset   Psychiatric Illness Mother    Hypertension Sister    Family Psychiatric  History: not noted Social History:  Social History   Substance and Sexual Activity  Alcohol Use Not Currently     Social History   Substance and Sexual Activity  Drug Use Not Currently   Types: Marijuana, Cocaine   Comment: crack/cocaine occasionally    Social History   Socioeconomic History   Marital status: Single    Spouse name: Not on file   Number of children: Not  on file   Years of education: 10   Highest education level: Not on file  Occupational History   Not on file  Tobacco Use   Smoking status: Some  Days    Packs/day: 0.50    Years: 5.00    Total pack years: 2.50    Types: Cigarettes   Smokeless tobacco: Never  Vaping Use   Vaping Use: Never used  Substance and Sexual Activity   Alcohol use: Not Currently   Drug use: Not Currently    Types: Marijuana, Cocaine    Comment: crack/cocaine occasionally   Sexual activity: Yes    Birth control/protection: None  Other Topics Concern   Not on file  Social History Narrative   ** Merged History Encounter **       ** Merged History Encounter **       Social Determinants of Health   Financial Resource Strain: Not on file  Food Insecurity: Not on file  Transportation Needs: Not on file  Physical Activity: Not on file  Stress: Not on file  Social Connections: Not on file   Additional Social History:    Allergies:  No Known Allergies  Labs:  Results for orders placed or performed during the hospital encounter of 06/15/22 (from the past 48 hour(s))  Comprehensive metabolic panel     Status: Abnormal   Collection Time: 06/15/22  3:02 AM  Result Value Ref Range   Sodium 140 135 - 145 mmol/L   Potassium 3.6 3.5 - 5.1 mmol/L   Chloride 109 98 - 111 mmol/L   CO2 27 22 - 32 mmol/L   Glucose, Bld 74 70 - 99 mg/dL    Comment: Glucose reference range applies only to samples taken after fasting for at least 8 hours.   BUN 17 6 - 20 mg/dL   Creatinine, Ser 1.00 0.61 - 1.24 mg/dL   Calcium 9.2 8.9 - 10.3 mg/dL   Total Protein 6.9 6.5 - 8.1 g/dL   Albumin 4.0 3.5 - 5.0 g/dL   AST 29 15 - 41 U/L   ALT 24 0 - 44 U/L   Alkaline Phosphatase 48 38 - 126 U/L   Total Bilirubin 0.5 0.3 - 1.2 mg/dL   GFR, Estimated >60 >60 mL/min    Comment: (NOTE) Calculated using the CKD-EPI Creatinine Equation (2021)    Anion gap 4 (L) 5 - 15    Comment: Performed at [facility], [place] [address], [place], [place] [address]  Ethanol     Status: None   Collection Time: 06/15/22  3:02 AM  Result Value Ref Range   Alcohol, Ethyl (B)  <10 <10 mg/dL    Comment: (NOTE) Lowest detectable limit for serum alcohol is 10 mg/dL.  For medical purposes only. Performed at [facility], [place] [address] [place], [place] [address]   Salicylate level     Status: Abnormal   Collection Time: 06/15/22  3:02 AM  Result Value Ref Range   Salicylate Lvl <5.4 (L) 7.0 - 30.0 mg/dL    Comment: Performed at [facility], [place] [address] [place], [place] [address]  Acetaminophen level     Status: Abnormal   Collection Time: 06/15/22  3:02 AM  Result Value Ref Range   Acetaminophen (Tylenol), Serum <10 (L) 10 - 30 ug/mL    Comment: (NOTE) Therapeutic concentrations vary significantly. A range of 10-30 ug/mL  may be an effective concentration for many patients. However, some  are best treated  at concentrations outside of this range. Acetaminophen concentrations >150 ug/mL at 4 hours after ingestion  and >50 ug/mL at 12 hours after ingestion are often associated with  toxic reactions.  Performed at [facility], [place] [address], [place], [place] [address]   cbc     Status: None   Collection Time: 06/15/22  3:02 AM  Result Value Ref Range   WBC 7.9 4.0 - 10.5 K/uL   RBC 4.62 4.22 - 5.81 MIL/uL   Hemoglobin 13.2 13.0 - 17.0 g/dL   HCT 39.8 39.0 - 52.0 %   MCV 86.1 80.0 - 100.0 fL   MCH 28.6 26.0 - 34.0 pg   MCHC 33.2 30.0 - 36.0 g/dL   RDW 13.2 11.5 - 15.5 %   Platelets 268 150 - 400 K/uL   nRBC 0.0 0.0 - 0.2 %    Comment: Performed at [facility], [place] [address] [place], [place] [address]  Rapid urine drug screen (hospital performed)     Status: Abnormal   Collection Time: 06/15/22  3:02 AM  Result Value Ref Range   Opiates NONE DETECTED NONE DETECTED   Cocaine POSITIVE (A) NONE DETECTED   Benzodiazepines NONE DETECTED NONE DETECTED   Amphetamines POSITIVE (A) NONE DETECTED   Tetrahydrocannabinol POSITIVE (A) NONE DETECTED   Barbiturates NONE DETECTED  NONE DETECTED    Comment: (NOTE) DRUG SCREEN FOR MEDICAL PURPOSES ONLY.  IF CONFIRMATION IS NEEDED FOR ANY PURPOSE, NOTIFY LAB WITHIN 5 DAYS.  LOWEST DETECTABLE LIMITS FOR URINE DRUG SCREEN Drug Class                     Cutoff (ng/mL) Amphetamine and metabolites    1000 Barbiturate and metabolites    200 Benzodiazepine                 696 Tricyclics and metabolites     300 Opiates and metabolites        300 Cocaine and metabolites        300 THC                            50 Performed at [facility], [place] [address], [place], [place] [address]   Resp Panel by RT-PCR (Flu A&B, Covid) Anterior Nasal Swab     Status: None   Collection Time: 06/15/22  6:31 AM   Specimen: Anterior Nasal Swab  Result Value Ref Range   SARS Coronavirus 2 by RT PCR NEGATIVE NEGATIVE    Comment: (NOTE) SARS-CoV-2 target nucleic acids are NOT DETECTED.  The SARS-CoV-2 RNA is generally detectable in upper respiratory specimens during the acute phase of infection. The lowest concentration of SARS-CoV-2 viral copies this assay can detect is 138 copies/mL. A negative result does not preclude SARS-Cov-2 infection and should not be used as the sole basis for treatment or other patient management decisions. A negative result may occur with  improper specimen collection/handling, submission of specimen other than nasopharyngeal swab, presence of viral mutation(s) within the areas targeted by this assay, and inadequate number of viral copies(<138 copies/mL). A negative result must be combined with clinical observations, patient history, and epidemiological information. The expected result is Negative.  Fact Sheet for Patients:  EntrepreneurPulse.com.au  Fact Sheet for Healthcare Providers:  IncredibleEmployment.be  This test is no t yet approved or cleared by the [place] FDA and  has been authorized for detection and/or diagnosis  of SARS-CoV-2  by FDA under an Emergency Use Authorization (EUA). This EUA will remain  in effect (meaning this test can be used) for the duration of the COVID-19 declaration under Section 564(b)(1) of the Act, 21 U.S.C.section 360bbb-3(b)(1), unless the authorization is terminated  or revoked sooner.       Influenza A by PCR NEGATIVE NEGATIVE   Influenza B by PCR NEGATIVE NEGATIVE    Comment: (NOTE) The Xpert Xpress SARS-CoV-2/FLU/RSV plus assay is intended as an aid in the diagnosis of influenza from Nasopharyngeal swab specimens and should not be used as a sole basis for treatment. Nasal washings and aspirates are unacceptable for Xpert Xpress SARS-CoV-2/FLU/RSV testing.  Fact Sheet for Patients: EntrepreneurPulse.com.au  Fact Sheet for Healthcare Providers: IncredibleEmployment.be  This test is not yet approved or cleared by the [place] FDA and has been authorized for detection and/or diagnosis of SARS-CoV-2 by FDA under an Emergency Use Authorization (EUA). This EUA will remain in effect (meaning this test can be used) for the duration of the COVID-19 declaration under Section 564(b)(1) of the Act, 21 U.S.C. section 360bbb-3(b)(1), unless the authorization is terminated or revoked.  Performed at [facility], [place] [address], [place], [place] [address]     Medications:  Current Facility-Administered Medications  Medication Dose Route Frequency Provider Last Rate Last Admin   OLANZapine zydis (ZYPREXA) disintegrating tablet 5 mg  5 mg Oral Q8H PRN Leevy-Johnson, Amari Burnsworth A, NP       And   LORazepam (ATIVAN) tablet 1 mg  1 mg Oral PRN Leevy-Johnson, Westen Dinino A, NP       And   ziprasidone (GEODON) injection 20 mg  20 mg Intramuscular PRN Leevy-Johnson, Dazani Norby A, NP       Current Outpatient Medications  Medication Sig Dispense Refill   paliperidone (INVEGA SUSTENNA) 234 MG/1.5ML SUSY injection Inject 234 mg into the muscle once  for 1 dose. (Patient not taking: Reported on 06/12/2022) 1.5 mL 0    Musculoskeletal: Strength & Muscle Tone: within normal limits Gait & Station: normal Patient leans: N/A  Psychiatric Specialty Exam:  Presentation  General Appearance: Casual; Disheveled  Eye Contact:Fair  Speech:Clear and Coherent; Normal Rate  Speech Volume:Normal  Handedness:Right   Mood and Affect  Mood:Dysphoric  Affect:Inappropriate   Thought Process  Thought Processes:Other (comment) (thought blocking)  Descriptions of Associations:Loose  Orientation:None  Thought Content:Illogical; Other (comment) (concrete)  History of Schizophrenia/Schizoaffective disorder:Yes  Duration of Psychotic Symptoms:Greater than six months  Hallucinations:Hallucinations: Auditory Description of Auditory Hallucinations: vague  Ideas of Reference:Paranoia  Suicidal Thoughts:Suicidal Thoughts: Yes, Active SI Active Intent and/or Plan: With Intent  Homicidal Thoughts:Homicidal Thoughts: No   Sensorium  Memory:Immediate Poor; Recent Poor; Remote Fair  Judgment:Poor  Insight:Shallow; Poor   Executive Functions  Concentration:Poor  Attention Span:Poor  [place] of Knowledge:Poor  Language:Poor   Psychomotor Activity  Psychomotor Activity:Psychomotor Activity: Normal  Assets  Assets:Resilience   Sleep  Sleep:Sleep: Fair   Physical Exam: Physical Exam Vitals and nursing note reviewed.  Constitutional:      Appearance: He is normal weight.  HENT:     Head: Normocephalic.     Nose: Nose normal.     Mouth/Throat:     Mouth: Mucous membranes are moist.     Pharynx: Oropharynx is clear.  Eyes:     Pupils: Pupils are equal, round, and reactive to light.  Cardiovascular:     Rate and Rhythm: Normal rate.     Pulses: Normal pulses.  Pulmonary:     Effort: Pulmonary effort is normal.  Musculoskeletal:        General: Normal range of motion.     Cervical back: Normal range  of motion.  Skin:    General: Skin is warm and dry.  Neurological:     Mental Status: He is alert. He is disoriented.  Psychiatric:        Attention and Perception: He perceives auditory hallucinations.        Mood and Affect: Affect is flat.        Speech: Speech is delayed.        Behavior: Behavior is withdrawn. Behavior is cooperative.        Thought Content: Thought content is paranoid. Thought content does not include homicidal or suicidal plan.        Cognition and Memory: Cognition is impaired. Memory is impaired.        Judgment: Judgment is impulsive and inappropriate.    Review of Systems  Psychiatric/Behavioral:  Positive for hallucinations and substance abuse.   All other systems reviewed and are negative.  Blood pressure 128/70, pulse 60, temperature 98.2 F (36.8 C), temperature source Oral, resp. rate 18, height '5\' 8"'$  (1.727 m), weight 55 kg, SpO2 100 %. Body mass index is 18.44 kg/m.  Treatment Plan Summary: Daily contact with patient to assess and evaluate symptoms and progress in treatment, Medication management, and Plan seek inpatient hospitalization for further observation, stabilization, and treatment. Patient has displayed need for wrap around services including but not limited to care coordination; [facility] consult placed to assist.   Disposition: Recommend psychiatric Inpatient admission when medically cleared. Supportive therapy provided about ongoing stressors. Discussed crisis plan, support from social network, calling 911, coming to the Emergency Department, and calling Suicide Hotline.  This service was provided via telemedicine using a 2-way, interactive audio and video technology.  Names of all persons participating in this telemedicine service and their role in this encounter. Name: Oneida Alar Role: PMHNP  Name: Hampton Abbot Role: Attending MD  Name: [name] Role: patient  Name: Druscilla Brownie Role: mother    Inda Merlin, NP 06/15/2022 4:06 PM

## 2022-06-16 DIAGNOSIS — R45851 Suicidal ideations: Secondary | ICD-10-CM

## 2022-06-16 NOTE — Social Work (Signed)
CSW met with Pt at bedside. CSW offered Pt transportation to [facility] in an effort to coordinate SUD care. Pt declined stating that he does not wish to go to rehab. At request of Pt, CSW called Pt's father, Izell [place] @ [phone] to ask if Pt might go and stay with him for a visit. Izell [place] became verbally aggressive stating that he does not agree with Pt being discharged at this time. CSW explained that that decision was made by [facility] and CSW was calling on behalf of Pt requesting a place to stay. Izell [place] continued to escalate after stating Pt could not stay and CSW ended call.  Pt again offered rehab and again declined. Pt states that he is expecting to meet his mother at the train depot in [place] on Wednesday.  Pt givin bus pass for d/c.

## 2022-06-16 NOTE — Social Work (Signed)
TOC spoke with brother, [name] @ [phone] and explained that TOC does not cover the  cost of bus tickets out of state. Brother states that he cannot afford to cover cost of ticket. Brother did state that mother is expected  in [place] on 6/21 and also that father resides in [place].

## 2022-06-16 NOTE — Discharge Instructions (Signed)
Follow-up as instructed by behavioral health 
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
## 2022-06-16 NOTE — ED Notes (Signed)
Spoke with patient's brother Ubaldo Glassing who called from residence in [place]. Explained that pt is stranded in [name] with no family or support. He wants his brother to come live with him in his two bedroom apartment. He wants permanent placement in more stable environment. Writer explained inpatient treatment process. Brother wants to be notified when treatment is complete. Contact information [phone]

## 2022-06-18 ENCOUNTER — Telehealth (HOSPITAL_COMMUNITY): Payer: Self-pay | Admitting: Pediatrics

## 2022-06-18 ENCOUNTER — Ambulatory Visit (HOSPITAL_COMMUNITY)
Admission: EM | Admit: 2022-06-18 | Discharge: 2022-06-18 | Disposition: A | Discharge status: Home / Self Care | Payer: Medicaid Other | Attending: Psychiatry | Admitting: Psychiatry

## 2022-06-18 DIAGNOSIS — F250 Schizoaffective disorder, bipolar type: Secondary | ICD-10-CM | POA: Insufficient documentation

## 2022-06-18 DIAGNOSIS — F1490 Cocaine use, unspecified, uncomplicated: Secondary | ICD-10-CM | POA: Insufficient documentation

## 2022-06-18 DIAGNOSIS — Z59819 Housing instability, housed unspecified: Secondary | ICD-10-CM | POA: Insufficient documentation

## 2022-06-18 NOTE — Progress Notes (Signed)
Tyler White received his AVS, questions answered and he  retrieved his personal belongings. He was escorted to the lobby and given a bus pass.

## 2022-06-18 NOTE — BH Assessment (Signed)
Care Management - Follow Up [facility] Discharges   Patient has been placed in an inpatient psychiatric hospital [facility] in [place])

## 2022-06-18 NOTE — Progress Notes (Signed)
   06/18/22 1319  [place] (Walk-ins at [facility] only)  What Is the Reason for Your Visit/Call Today? Patient is a 26 y.o. male with a history of Schizoaffective Disorder, bipolar type and cocaine use who presents voluntarily via [employer].  Officers report patient flagged them down and asked for a ride here.  Patient appears groggy, barely able to keep his eyes open during triage.  Upon entry into assessment room for triage, he immediately pulled the 3 chairs together to lay down.  He shares he has been homeless "on and off" for 3 years.  He then stated he often stays with his mother when she is in town.  She will return from [place] tomorrow.  Patient is able to confirm his diagnosis and states he gets Invega injections monthly.  He shares his next dose is due 7/5 and he reports he gets his injections when it's due here at [facility].  Patient denies SI, HI, AVH or recent substance use.  When asked how we could help him, he states, " I need rest.  I'd like to stay here 1, 2, or 3 days) to rest.  How Long Has This Been Causing You Problems? 1-6 months  Have You Recently Had Any Thoughts About Hurting Yourself? No  Are You Planning to Commit Suicide/Harm Yourself At This time? No  Have you Recently Had Thoughts About [place]? No  Are You Planning To Harm Someone At This Time? No  Are you currently experiencing any auditory, visual or other hallucinations? No  Have You Used Any Alcohol or Drugs in the Past 24 Hours? No  How long ago did you use Drugs or Alcohol? Denies recent use  Do you have any current medical co-morbidities that require immediate attention? No  If access to [facility] was not available, would you have sought care in the Emergency Department? No  Determination of Need Routine (7 days)  Options For Referral Outpatient Therapy;Medication Management

## 2022-06-18 NOTE — BH Assessment (Signed)
LCSW Progress Note  Per Aura Fey, NP, this pt does not require psychiatric hospitalization at this time.  Pt is psychiatrically cleared.  Discharge instructions include several resources for shelters and hot meals.  Pt has a follow-up appointment prior to 03 July 2022.  EDP Katherine Roan, NP, has been notified.  Omelia Blackwater, MSW, [place] [phone] or [phone]

## 2022-06-18 NOTE — Discharge Instructions (Addendum)
Please follow up with your outpatient psychiatric provider at [facility]. Your future appointments are on 06/27/22 at 10:00AM and 07/03/22 at 1:00PM.   Below is a list of shelters as this is the only type of resource that can offer a temporary place to stay.  [facility] [address] [place], [place] [address] [phone] Population served: Adult men & women (24 years old and older, able to perform activities for daily living) Documents required: Valid ID & [place] [address] [place], [place]  [address] [phone] Population served: Families with children  [facility] [address] [place], [place] [address] [phone] Population served: Males 18+ Documents required: Valid ID & Social Security Card  [facility] [place], [place], [place] [address] [phone] Population served: Single adults and families with children Documents required: Valid ID & another form of identification  [facility] [address], [place], [place] [address] [phone] Population Served: Families with children, adult women, and adult men.  [facility] [address] [place], [place] [address] [phone]    Please call the churches/agencies/organizations listed below to learn more about when and where hot meals are provided.   [facility] [address], [place], [place] [address]  [facility] [employer] [facility] [place], [place], [place] [address]  [facility] ([address], [place], [place] [address] Dinner: Tuesdays & Thursdays 6:00PM - 6:30PM  [facility] [address], [place], [place]  [address] [phone]  [facility] [address], [place], [place] [address] [phone]  [facility]  [address], [place], [place] [address] [phone]

## 2022-06-18 NOTE — ED Provider Notes (Signed)
Behavioral Health Urgent Care Medical Screening Exam  Patient Name: Tyler White MRN: [id number] Date of Evaluation: 06/18/22 Chief Complaint:   Diagnosis:  Final diagnoses:  Housing instability   History of Present illness: Tyler White is a 26 y.o. male. Pt presents voluntarily to [facility] behavioral health for walk-in assessment escorted by [employer]. Pt is assessed face-to-face by nurse practitioner.   Pt is a 26 y/o male w/ hx of schizoaffective disorder, bipolar type and cocaine use. He is well known to the psychiatric service line. Pt has had 25 ED visits in the past 6 months. Prior to his presentation today at [facility], pt was most recently seen at [facility] on 06/15/22 presenting w/ SI w/o plan.   Pt reports he is presenting today because he needs a place to stay until his mother returns to [facility] tomorrow. He states he has plans to meet his mother tomorrow at the downtown [place] bus/train station. He states his mother is currently in [place] with his grandmother and he will be staying with her at her hotel when she returns to [place].   Pt denies SI/VI/HI. He denies AVH, paranoia. He denies recent substance use.   Pt is a&ox3, in no acute distress, non-toxic appearing. He is casually dressed, appears disheveled. Eye contact is fair. Speech is clear and coherent, w/ nml rate and volume. Reported mood is euthymic. Affect is blunt. TP is coherent, goal directed, linear. Description of associations is intact. TC is logical. There is no evidence of agitation, aggression, or distractibility. No delusions or paranoia elicited. Pt is calm, cooperative, pleasant.   Psychiatric Specialty Exam  Presentation  General Appearance:Casual; Disheveled  Eye Contact:Fair  Speech:Clear and Coherent; Normal Rate  Speech Volume:Normal  Handedness:Right   Mood and Affect  Mood:Euthymic  Affect:Blunt   Thought Process  Thought Processes:Coherent; Goal Directed;  Linear  Descriptions of Associations:Intact  Orientation:Full (Time, Place and Person)  Thought Content:Logical  Diagnosis of Schizophrenia or Schizoaffective disorder in past: Yes  Duration of Psychotic Symptoms: Greater than six months  Hallucinations:None  Ideas of Reference:None  Suicidal Thoughts:No  Homicidal Thoughts:No   Sensorium  Memory:Immediate Fair  Judgment:Intact  Insight:Present   Executive Functions  Concentration:Fair  Attention Span:Fair  [place]   Psychomotor Activity  Psychomotor Activity:Normal  Assets  Assets:Resilience; Physical Health  Sleep  Sleep:Fair  Number of hours: 5  No data recorded  Physical Exam: Physical Exam Cardiovascular:     Rate and Rhythm: Normal rate.  Pulmonary:     Effort: Pulmonary effort is normal.  Neurological:     Mental Status: He is alert and oriented to person, place, and time.  Psychiatric:        Attention and Perception: Attention and perception normal.        Mood and Affect: Mood normal. Affect is blunt.        Speech: Speech normal.        Behavior: Behavior normal. Behavior is cooperative.        Thought Content: Thought content normal.    Review of Systems  Constitutional:  Negative for chills and fever.  Respiratory:  Negative for shortness of breath.   Cardiovascular:  Negative for chest pain and palpitations.  Gastrointestinal:  Negative for abdominal pain.  Psychiatric/Behavioral: Negative.     Blood pressure 124/72, pulse 73, temperature 97.9 F (36.6 C), temperature source Oral, resp. rate 18, SpO2 99 %. There is no height or weight on file to calculate BMI.  Musculoskeletal: Strength & Muscle Tone: within normal limits Gait & Station: normal Patient leans: N/A   [place] MSE Discharge Disposition for Follow up and Recommendations: Based on my evaluation the patient does not appear to have an emergency medical condition and can  be discharged with resources and follow up care in outpatient services  Tharon Aquas, NP 06/18/2022, 2:09 PM

## 2022-06-19 ENCOUNTER — Other Ambulatory Visit: Payer: Self-pay

## 2022-06-19 ENCOUNTER — Emergency Department (HOSPITAL_COMMUNITY)
Admission: EM | Admit: 2022-06-19 | Discharge: 2022-06-19 | Disposition: A | Discharge status: Other Acute Inpatient Hospital | Payer: Medicaid Other | Attending: Emergency Medicine | Admitting: Emergency Medicine

## 2022-06-19 ENCOUNTER — Encounter (HOSPITAL_COMMUNITY): Payer: Self-pay

## 2022-06-19 ENCOUNTER — Ambulatory Visit (HOSPITAL_COMMUNITY)
Admission: EM | Admit: 2022-06-19 | Discharge: 2022-06-19 | Disposition: A | Discharge status: Home / Self Care | Payer: Medicaid Other | Source: Home / Self Care

## 2022-06-19 DIAGNOSIS — Z5900 Homelessness unspecified: Secondary | ICD-10-CM | POA: Insufficient documentation

## 2022-06-19 DIAGNOSIS — R443 Hallucinations, unspecified: Secondary | ICD-10-CM | POA: Diagnosis present

## 2022-06-19 DIAGNOSIS — R45851 Suicidal ideations: Secondary | ICD-10-CM | POA: Insufficient documentation

## 2022-06-19 DIAGNOSIS — F1410 Cocaine abuse, uncomplicated: Secondary | ICD-10-CM | POA: Insufficient documentation

## 2022-06-19 NOTE — Discharge Instructions (Addendum)
Patient is instructed prior to discharge to:  Take all medications as prescribed by his/her mental healthcare provider. Report any adverse effects and or reactions from the medicines to his/her outpatient provider promptly. Keep all scheduled appointments, to ensure that you are getting refills on time and to avoid any interruption in your medication.  If you are unable to keep an appointment call to reschedule.  Be sure to follow-up with resources and follow-up appointments provided.  Patient has been instructed & cautioned: To not engage in alcohol and or illegal drug use while on prescription medicines. In the event of worsening symptoms, patient is instructed to call the crisis hotline, 911 and or go to the nearest ED for appropriate evaluation and treatment of symptoms. To follow-up with his/her primary care provider for your other medical issues, concerns and or health care needs.    Substance Abuse Treatment Resources listed Below:  [facility] - Admissions are currently completed Monday through Friday at 8am; both appointments and walk-ins are accepted.  Any individual that is a [place] resident may present for a substance abuse screening and assessment for admission.  A person may be referred by numerous sources or self-refer.   Potential clients will be screened for medical necessity and appropriateness for the program.  Clients must meet criteria for high-intensity residential treatment services.  If clinically appropriate, a client will continue with the comprehensive clinical assessment and intake process, as well as enrollment in the [facility].  Address: [address] [place], [place] [address] Admin Hours: Mon-Fri 8AM to 5PM Center Hours: 24/7 Phone: [phone] Fax: [phone]  [facility] Address: [address], [place], [place] [address] [facility] ([facility]) Hours: 24/7 Phone:  [phone] Fax: [phone]  [facility]): (offers outpatient therapy and intensive outpatient substance abuse therapy).  [address], [place], [place] [address] Phone: [phone]  [facility]: Offers FREE recovery skills classes, support groups, 1:1 Peer Support, and Compeer Classes. [address], [place], [place] [address] Phone: [phone] (Call to complete intake).   [facility] [address] [place], [place] [address] Phone: [phone] ext [phone] [facility] provides food, shelter and other programs and services to the homeless men of [place] through our men's program.  By offering safe shelter, three meals a day, clean clothing, Biblical counseling, financial planning, vocational training, GED/education and employment assistance, we've helped mend the shattered lives of many homeless men since opening in 1974.  We have approximately 267 beds available, with a max of 312 beds including mats for emergency situations and currently house an average of 270 men a night.  Prospective Client Check-In Information Photo ID Required (State/ Out of State/ DOC) - if photo ID is not available, clients are required to have a printout of a police/sheriff's criminal history report. Help out with chores around the Mission. No sex offender of any type (pending, charged, registered and/or any other sex related offenses) will be permitted to check in. Must be willing to abide by all rules, regulations, and policies established by the [facility]. The following will be provided - shelter, food, clothing, and biblical counseling. If you or someone you know is in need of assistance at our men's shelter in [place], [place], please call [phone] ext. [phone].  [facility]-will provide timely access to mental health services for children and adolescents (4-17) and adults    presenting in a mental health crisis. The program is designed for those who need urgent Behavioral Health or Substance Use treatment and are not experiencing a medical crisis that would typically require an emergency room visit.    [address] [place], [place] [address] Phone: [phone] [email or web address]  [facility]: Phone#: [phone]  The [facility] SA Intensive Outpatient Program (SAIOP) means structured individual and group addiction activities and services that are provided at an outpatient program designed to assist adult and adolescent consumers to begin recovery and learn skills for recovery maintenance. The [facility] SAIOP program is offered at least 3 hours a day, 3 days a week.SAIOP services shall include a structured program consisting of, but not limited to, the following services: Individual counseling and support; Group counseling and support; Family counseling, training or support; Biochemical assays to identify recent drug use (e.g., urine drug screens); Strategies for relapse prevention to include community and social support systems in treatment; Life skills; Crisis contingency planning; Disease Management; and Treatment support activities that have been adapted or specifically designed for persons with physical disabilities, or persons with co-occurring disorders of mental illness and substance abuse/dependence or mental retardation/developmental disability and substance abuse/dependence. Phone: [phone]  Address:   [facility] [place] will also offer the following outpatient services: (Monday through Friday 8am-5pm)   Partial Hospitalization Program (PHP) Substance Abuse Intensive Outpatient Program (SA-IOP) Group Therapy Medication Management Peer Living Room We also provide (24/7):  Assessments: Our mental health clinician and providers will conduct a focused mental health evaluation, assessing for immediate  safety concerns and further mental health needs. Referral: Our team will provide resources and help connect to community based mental health treatment, when indicated, including psychotherapy, psychiatry, and other specialized behavioral health or substance use disorder services (for those not already in treatment). Transitional Care: Our team providers in person bridging and/or telephonic follow-up during the patient's transition to outpatient services.  [facility] 24-Hour Call Center: [phone] Behavioral Health Crisis Line: [phone]  

## 2022-06-19 NOTE — ED Triage Notes (Signed)
Pt arrived POV from home c/o SI. Pt states he had the thought of cutting his wirst with a piece of glass tonight. Pt states he no longer has these thoughts but wants help.

## 2022-06-19 NOTE — ED Provider Notes (Cosign Needed Addendum)
[facility] EMERGENCY DEPARTMENT Provider Note   CSN: [id number] Arrival date & time: 06/19/22  0252     History  Chief Complaint  Patient presents with   Suicidal    Tyler White is a 26 y.o. male with a history of schizophrenia and polysubstance use who presents to the emergency department with complaints of suicidal ideation.  Patient states that about an hour prior to arrival he started to feel suicidal with plans to cut himself therefore he came to get help.  He does not wish to harm himself, he states he would rather have assistance.  Reports some hallucinations.  Denies homicidal ideation.  HPI     Home Medications Prior to Admission medications   Medication Sig Start Date End Date Taking? Authorizing Provider  paliperidone (INVEGA SUSTENNA) 234 MG/1.5ML SUSY injection Inject 234 mg into the muscle once for 1 dose. Patient not taking: Reported on 06/12/2022 04/16/22 06/05/22  Tharon Aquas, NP  gabapentin (NEURONTIN) 400 MG capsule Take 1 capsule (400 mg total) by mouth 3 (three) times daily. Patient not taking: Reported on 06/09/2021 04/30/21 06/10/21  Ethelene Hal, NP      Allergies    Patient has no known allergies.    Review of Systems   Review of Systems  Constitutional:  Negative for chills and fever.  Respiratory:  Negative for shortness of breath.   Cardiovascular:  Negative for chest pain.  Gastrointestinal:  Negative for abdominal pain and vomiting.  Neurological:  Negative for syncope.  Psychiatric/Behavioral:  Positive for hallucinations and suicidal ideas.   All other systems reviewed and are negative.   Physical Exam Updated Vital Signs BP 131/90 (BP Location: Left Arm)   Pulse 84   Temp (!) 97.4 F (36.3 C) (Oral)   Resp 18   Ht '5\' 8"'$  (1.727 m)   Wt 71.2 kg   SpO2 100%   BMI 23.87 kg/m  Physical Exam Vitals and nursing note reviewed.  Constitutional:      General: He is not in acute distress.    Appearance:  He is well-developed. He is not toxic-appearing.  HENT:     Head: No raccoon eyes or Battle's sign.  Eyes:     General:        Right eye: No discharge.        Left eye: No discharge.     Conjunctiva/sclera: Conjunctivae normal.  Cardiovascular:     Rate and Rhythm: Normal rate and regular rhythm.  Pulmonary:     Effort: No respiratory distress.     Breath sounds: Normal breath sounds. No wheezing or rales.  Abdominal:     General: There is no distension.     Palpations: Abdomen is soft.     Tenderness: There is no abdominal tenderness.  Musculoskeletal:     Cervical back: Neck supple.  Skin:    General: Skin is warm and dry.  Neurological:     Mental Status: He is alert.     Comments: Clear speech.   Psychiatric:        Mood and Affect: Mood is depressed.        Behavior: Behavior is cooperative.     ED Results / Procedures / Treatments   Labs (all labs ordered are listed, but only abnormal results are displayed) Labs Reviewed - No data to display  EKG None  Radiology No results found.  Procedures Procedures    Medications Ordered in ED Medications - No data to display  ED Course/ Medical Decision Making/ A&P                           Medical Decision Making  Patient presents to the emergency department with reports of suicidal ideation.  Nontoxic, vitals without significant abnormality. Multiple ED visits for similar.  Consult placed for TTS assessment- discussed with [facility] team- plan to transfer to [facility] for assessment, Dr. Dwyane Dee accepting.     Final Clinical Impression(s) / ED Diagnoses Final diagnoses:  Suicidal ideation    Rx / DC Orders ED Discharge Orders     None         Amaryllis Dyke, PA-C 06/19/22 0537    Amaryllis Dyke, PA-C 06/19/22 [phone]
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
## 2022-06-19 NOTE — Progress Notes (Signed)
TRIAGE: URGENT   06/19/22 0600  [place] Triage Screening (Walk-ins at [facility] only)  How Did You Hear About [place]? Self  What Is the Reason for Your Visit/Call Today? Pt states, "My brothers thought I was acting suicidal. They noticed something different about me. It might have been the drugs. I cut myself." Pt showed clinician his left wrist which had several superficial cuts on it; he states he was attempting to cut himself. When asked if he is still feeling suicidal, pt says, "yes and no." Pt has been hospitalized for mental health concerns in the past; by this clinician's count, pt has been seen in the ED/Urgent Care 27 times in 2023. Pt states he had a plan to kill himself by o/d on pills and cocaine but now states he does not think he will do this. Pt denies HI, AVH, NSSIB, access to guns/weapons, and engagement with the legal system. Pt shares he smoked crack cocaine last night; he states he used $30 worth and that he typically uses several times/week. Pt shares he smoked marijuana last night; he states he uses 2-3 blunts a few times/week. Pt states he took 1 "X" pill the night before last; he states he takes these every-other night.  How Long Has This Been Causing You Problems? <Week  Have You Recently Had Any Thoughts About Hurting Yourself? Yes  How long ago did you have thoughts about hurting yourself? Earlier [employer] Planning to Commit Suicide/Harm Yourself At This time? No  Have you Recently Had Thoughts About [place]? No  How long ago did you have thoughts of harming others? N/A  Are You Planning To Harm Someone At This Time? No  Are you currently experiencing any auditory, visual or other hallucinations? No  Please explain the hallucinations you are currently experiencing: N/A  Have You Used Any Alcohol or Drugs in the Past 24 Hours? Yes  How long ago did you use Drugs or Alcohol? Pt shares he smoked crack cocaine last night; he states he used $30 worth and that he  typically uses several times/week. Pt shares he smoked marijuana last night; he states he uses 2-3 blunts a few times/week. Pt states he took 1 "X" pill the night before last; he states he takes these every-other night.  What Did You Use and How Much? Pt shares he smoked crack cocaine last night; he states he used $30 worth and that he typically uses several times/week. Pt shares he smoked marijuana last night; he states he uses 2-3 blunts a few times/week. Pt states he took 1 "X" pill the night before last; he states he takes these every-other night.  Do you have any current medical co-morbidities that require immediate attention? No  Clinician description of patient physical appearance/behavior: Pt is dressed in hospital scrubs, as he was transferred from [facility]. He was lying across the 3 chairs in the room and covered with a blanket. Pt was able to answer the questions posed.  What Do You Feel Would Help You the Most Today? Housing Assistance;Treatment for Depression or other mood problem  If access to [facility]gent [facility] was not available, would you have sought care in the Emergency Department? Yes  Determination of Need Urgent (48 hours)  Options For Referral Outpatient Therapy;Medication Management;BH Urgent Care

## 2022-06-19 NOTE — ED Provider Triage Note (Signed)
Emergency Medicine Provider Triage Evaluation Note  Tyler White , a 26 y.o. male  was evaluated in triage.  Pt complains of suicidal ideation earlier tonight with plan to cut himself, improved at present, states he wants help. Reports hallucinations.   Review of Systems  Positive: Si, hallucinations Negative: HI, chest pain, dyspnea  Physical Exam  BP 131/90 (BP Location: Left Arm)   Pulse 84   Temp (!) 97.4 F (36.3 C) (Oral)   Resp 18   Ht '5\' 8"'$  (1.727 m)   Wt 71.2 kg   SpO2 100%   BMI 23.87 kg/m  Gen:   Awake, no distress   Resp:  Normal effort  MSK:   Moves extremities without difficulty   Medical Decision Making  Medically screening exam initiated at 3:44 AM.  Appropriate orders placed.  Tyler White was informed that the remainder of the evaluation will be completed by another provider, this initial triage assessment does not replace that evaluation, and the importance of remaining in the ED until their evaluation is complete.  SI   Amaryllis Dyke, PA-C 06/19/22 7026
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
## 2022-06-24 ENCOUNTER — Ambulatory Visit (HOSPITAL_COMMUNITY)
Admission: EM | Admit: 2022-06-24 | Discharge: 2022-06-24 | Disposition: A | Discharge status: Home / Self Care | Payer: Medicaid Other | Attending: Urology | Admitting: Urology

## 2022-06-24 DIAGNOSIS — Z5900 Homelessness unspecified: Secondary | ICD-10-CM | POA: Insufficient documentation

## 2022-06-24 DIAGNOSIS — F250 Schizoaffective disorder, bipolar type: Secondary | ICD-10-CM | POA: Insufficient documentation

## 2022-06-24 NOTE — ED Provider Notes (Signed)
Behavioral Health Urgent Care Medical Screening Exam  Patient Name: Tyler White MRN: [id number] Date of Evaluation: 06/24/22 Chief Complaint:   Diagnosis:  Final diagnoses:  Schizoaffective disorder, bipolar type (H[facility])  Homelessness    History of Present illness: Tyler White is a 26 y.o. male with psychiatric history of schizoaffective disorder, bipolar type, cocaine abuse, and malingering.  Patient presented voluntarily to [facility] for a walk-in assessment.   Patient reports that he has a history of auditory and visual hallucination.  He says hallucination has been present since age 63.  He shares that he is experiencing auditory hallucination of "voices I cannot make out" and visual hallucination of seeing "people." He denies experiencing worsening hallucinations or worsening of psychiatric symptoms.  He reports that he is currently on Invega injections.  He says that he is compliant  with his Invega injection; per chart review he received 234 mg [place] shot on 06/05/2022. He says he would like to be admitted to [facility] to rest and to have his medication changed to Zyprexa. He denies all other medical and psychiatric concerns. He denies suicidal ideation, homicidal ideation, and paranoia. He says he abuses cocaine but has not used cocaine in the pass 48 hours.   Psychiatric Specialty Exam  Presentation  General Appearance:Fairly Groomed  Eye Contact:Good  Speech:Clear and Coherent  Speech Volume:Normal  Handedness:Right   Mood and Affect  Mood:Euthymic  Affect:Congruent   Thought Process  Thought Processes:Coherent  Descriptions of Associations:Intact  Orientation:Full (Time, Place and Person)  Thought Content:WDL  Diagnosis of Schizophrenia or Schizoaffective disorder in past: Yes  Duration of Psychotic Symptoms: Greater than six months  Hallucinations:Auditory; Visual NA "voices, I can't make out what they are saying" "people"  Ideas of  Reference:None  Suicidal Thoughts:No With Intent Without Intent  Homicidal Thoughts:No   Sensorium  Memory:Immediate Good; Recent Fair; Remote Fair  Judgment:Fair  Insight:Fair   Executive Functions  Concentration:Good  Attention Span:Good  Recall:Good  Fund of Knowledge:Good  Language:Good   Psychomotor Activity  Psychomotor Activity:Normal -- (NA) No   Assets  Assets:Physical Health; Communication Skills   Sleep  Sleep:Fair  Number of hours: 5   No data recorded  Physical Exam: Physical Exam Vitals and nursing note reviewed.  Constitutional:      General: He is not in acute distress.    Appearance: He is well-developed.  HENT:     Head: Normocephalic and atraumatic.  Eyes:     Conjunctiva/sclera: Conjunctivae normal.  Cardiovascular:     Rate and Rhythm: Normal rate.  Pulmonary:     Effort: Pulmonary effort is normal. No respiratory distress.     Breath sounds: Normal breath sounds.  Abdominal:     Palpations: Abdomen is soft.     Tenderness: There is no abdominal tenderness.  Musculoskeletal:        General: No swelling.     Cervical back: Neck supple.  Skin:    General: Skin is dry.     Capillary Refill: Capillary refill takes less than 2 seconds.  Neurological:     Mental Status: He is alert and oriented to person, place, and time.  Psychiatric:        Attention and Perception: He perceives auditory and visual hallucinations.        Mood and Affect: Mood normal.        Speech: Speech normal.        Behavior: Behavior normal. Behavior is cooperative.        Thought Content:  Thought content normal. Thought content does not include suicidal ideation. Thought content does not include suicidal plan.        Cognition and Memory: Cognition normal.    Review of Systems  Constitutional: Negative.   HENT: Negative.    Eyes: Negative.   Respiratory: Negative.    Cardiovascular: Negative.   Gastrointestinal: Negative.   Genitourinary:  Negative.   Musculoskeletal: Negative.   Skin: Negative.   Neurological: Negative.   Endo/Heme/Allergies: Negative.   Psychiatric/Behavioral:  Positive for hallucinations and substance abuse.    Blood pressure 124/83, pulse 61, temperature 98.4 F (36.9 C), temperature source Oral, resp. rate 16, SpO2 100 %. There is no height or weight on file to calculate BMI.  Musculoskeletal: Strength & Muscle Tone: within normal limits Gait & Station: normal Patient leans: Right   [facility] MSE Discharge Disposition for Follow up and Recommendations: Based on my evaluation the patient does not appear to have an emergency medical condition and can be discharged with resources and follow up care in outpatient services for Medication Management and Individual Therapy  Patient advised to contact provider to schedule an appointment to discuss medication adjustment.  Information for [facility] provided to patient. He was advised to return or call during business hours for appointment.   No evidence of imminent danger to self or others at this time. Patient does not meet criteria for psychiatric admission or IVC. Supportive therapy provided about ongoing stressors. Discussed crisis plan, callling [phone] or going to Emergency Dept   Maricela Bo, NP 06/24/2022, 5:17 AM

## 2022-06-25 ENCOUNTER — Emergency Department (HOSPITAL_COMMUNITY)
Admission: EM | Admit: 2022-06-25 | Discharge: 2022-06-26 | Disposition: A | Discharge status: Home / Self Care | Payer: Medicaid Other | Attending: Emergency Medicine | Admitting: Emergency Medicine

## 2022-06-25 ENCOUNTER — Other Ambulatory Visit: Payer: Self-pay

## 2022-06-25 ENCOUNTER — Encounter (HOSPITAL_COMMUNITY): Payer: Self-pay | Admitting: Emergency Medicine

## 2022-06-25 ENCOUNTER — Emergency Department (HOSPITAL_COMMUNITY): Payer: Medicaid Other

## 2022-06-25 DIAGNOSIS — Z5900 Homelessness unspecified: Secondary | ICD-10-CM | POA: Diagnosis not present

## 2022-06-25 DIAGNOSIS — R45851 Suicidal ideations: Secondary | ICD-10-CM | POA: Insufficient documentation

## 2022-06-25 DIAGNOSIS — M79671 Pain in right foot: Secondary | ICD-10-CM | POA: Diagnosis present

## 2022-06-25 NOTE — ED Triage Notes (Signed)
Patient arrived with EMS from street ( homeless) reports pain at right great toe with blister from walking and he adds suicidal ideation but did not disclose his plan .

## 2022-06-25 NOTE — ED Provider Triage Note (Signed)
Emergency Medicine Provider Triage Evaluation Note  Corderro Nissley , a 26 y.o. male  was evaluated in triage.  Pt complains of right great toe pain.  Patient states that symptoms began over the past few days.  He is homeless and has had to walk increasing amounts of distances in his crocs.  His crocs noted to have a hole in area affected by his toe.  He mentioned to the nurse that he had suicidal ideation without plan, but he denied during my interview.  Denies fever, chills, night sweats, chest pain, shortness of breath, abdominal pain, nausea/vomiting/diarrhea.  Review of Systems  Positive:  Negative: See above  Physical Exam  BP 114/73 (BP Location: Right Arm)   Pulse 66   Temp 98.4 F (36.9 C) (Oral)   Resp 16   SpO2 98%  Gen:   Awake, no distress   Resp:  Normal effort  MSK:   Moves extremities without difficulty.  Patient able to move toes without difficulty.  Small abrasion noted on the plantar surface of right great toe.  No active bleeding noticed.  Tender to palpation along affected toe as well as second and third metatarsals. Other:    Medical Decision Making  Medically screening exam initiated at 9:05 PM.  Appropriate orders placed.  Kyra Sigona was informed that the remainder of the evaluation will be completed by another provider, this initial triage assessment does not replace that evaluation, and the importance of remaining in the ED until their evaluation is complete.     Peter Garter, [place] 06/25/22 [id number]

## 2022-06-26 ENCOUNTER — Emergency Department (HOSPITAL_COMMUNITY)
Admission: EM | Admit: 2022-06-26 | Discharge: 2022-06-26 | Discharge status: Against Medical Advice | Payer: Medicaid Other

## 2022-06-26 NOTE — ED Provider Notes (Signed)
[facility] EMERGENCY DEPARTMENT Provider Note   CSN: [id number] Arrival date & time: 06/25/22  2031     History  Chief Complaint  Patient presents with   Toe Pain  / SI     Tyler White is a 26 y.o. male.  HPI  Patient with medical history including schizophrenia, polysubstance dependency, presents emerged part with complaints of right toe pain, right toe pain started yesterday, states that he was walking and he hit his toe on a rock, states his pain in his right great toe does not radiate remains constant, is worse when ambulation improved with rest, patient states he has no other complaints  I reviewed patient's chart extensive ED visits mainly for suicidal ideations, most recently seen in the 21st, was evaluated by psychiatry and was discharged home, he was seen at behavioral health urgent care yesterday where he was also stable for discharge at that time.  Home Medications Prior to Admission medications   Medication Sig Start Date End Date Taking? Authorizing Provider  paliperidone (INVEGA SUSTENNA) 234 MG/1.5ML SUSY injection Inject 234 mg into the muscle once for 1 dose. Patient not taking: Reported on 06/12/2022 04/16/22 06/05/22  Tharon Aquas, NP  gabapentin (NEURONTIN) 400 MG capsule Take 1 capsule (400 mg total) by mouth 3 (three) times daily. Patient not taking: Reported on 06/09/2021 04/30/21 06/10/21  Ethelene Hal, NP      Allergies    Patient has no known allergies.    Review of Systems   Review of Systems  Constitutional:  Negative for chills and fever.  Respiratory:  Negative for shortness of breath.   Cardiovascular:  Negative for chest pain.  Gastrointestinal:  Negative for abdominal pain.  Musculoskeletal:        Right great toe pain  Neurological:  Negative for headaches.    Physical Exam Updated Vital Signs BP 114/73 (BP Location: Right Arm)   Pulse 66   Temp 98.4 F (36.9 C) (Oral)   Resp 16   SpO2 98%   Physical Exam Vitals and nursing note reviewed.  Constitutional:      General: He is not in acute distress.    Appearance: He is not ill-appearing.  HENT:     Head: Normocephalic and atraumatic.     Nose: No congestion.  Eyes:     Conjunctiva/sclera: Conjunctivae normal.  Cardiovascular:     Rate and Rhythm: Normal rate and regular rhythm.     Pulses: Normal pulses.     Heart sounds: No murmur heard.    No friction rub. No gallop.  Pulmonary:     Effort: Pulmonary effort is normal.     Breath sounds: Normal breath sounds.  Musculoskeletal:     Comments: Focused exam of the right foot reveals edema noted on the dorsum of the foot without overlying skin change, he also has some edema around the first great toe, small abrasion noted on the plantar aspect of the great toe hemodynamically stable no evidence of infection, has full range of motion in all toes and ankle, 2+ dorsal pedal pulses, neurovascular fully intact.  Please see picture for full detail.  Skin:    General: Skin is warm and dry.  Neurological:     Mental Status: He is alert.     Comments: He denies suicidal homicidal ideations he is responding appropriately, does not appear to be responding to internal stimuli.  Psychiatric:        Mood and Affect: Mood normal.  ED Results / Procedures / Treatments   Labs (all labs ordered are listed, but only abnormal results are displayed) Labs Reviewed - No data to display  EKG None  Radiology DG Foot Complete Right  Result Date: 06/25/2022 CLINICAL DATA:  Right foot pain EXAM: RIGHT FOOT COMPLETE - 3+ VIEW COMPARISON:  Multiple prior foot radiographs most recently 1 month ago 05/14/2022 FINDINGS: There is no evidence of fracture or dislocation. There is no evidence of arthropathy or other focal bone abnormality. Mild dorsal soft tissue edema. No soft tissue gas or radiopaque foreign body IMPRESSION: Mild dorsal soft tissue edema. No acute osseous abnormality.  Electronically Signed   By: Keith Rake M.D.   On: 06/25/2022 21:28    Procedures Procedures    Medications Ordered in ED Medications - No data to display  ED Course/ Medical Decision Making/ A&P                           Medical Decision Making  This patient presents to the ED for concern of right foot pain, this involves an extensive number of treatment options, and is a complaint that carries with it a high risk of complications and morbidity.  The differential diagnosis includes fracture, dislocation, compartment syndrome, infection    Additional history obtained:  Additional history obtained from N/A External records from outside source obtained and reviewed including behavioral health notes, present ED notes   Co morbidities that complicate the patient evaluation  Psychiatric disorder   Social Determinants of Health:  Homelessness    Lab Tests:  I Ordered, and personally interpreted labs.  The pertinent results include: N/A   Imaging Studies ordered:  I ordered imaging studies including x-ray of right foot I independently visualized and interpreted imaging which showed negative acute findings I agree with the radiologist interpretation   Cardiac Monitoring:  N/A   Medicines ordered and prescription drug management:  I ordered medication including N/A I have reviewed the patients home medicines and have made adjustments as needed  Critical Interventions:  N/A   Reevaluation:  Presents with right foot pain, triage obtained imaging which I personally reviewed they are unremarkable, patient benign physical exam will provide with a brace.   noted  some point patient endorsed to triage that he was suicidal, on my evaluation patient did not endorse this, denies any suicidal homicidal ideations, he states that he is here because his foot hurts and he would like to stay the night because he does not walk very much on his foot, will allow him to rest  and discharge him, patient is in agreeable with this plan.  Consultations Obtained:  N/A    Test Considered:  CBC and chemistry chemistry-this we deferred suspicion for infectious is very low at this time, as well as metabolic derailments.    Rule out I have low suspicion for septic arthritis as patient denies IV drug use, skin exam was performed no erythematous, edematous, warm joints noted on exam. Low suspicion for fracture or dislocation as x-ray does not feel any significant findings. low suspicion for ligament or tendon damage as area was palpated no gross defects noted, they had full range of motion as well as 5/5 strength.  Low suspicion for compartment syndrome as area was palpated it was soft to the touch, neurovascular fully intact.  I have low suspicion for psychiatric emergency as he is not endorsing suicidal homicidal ideations responding appropriately does not appear to be  responding to internal stimuli.  I have low suspicion for withdrawals as he is on trauma so my exam vital signs are reassuring.    Dispostion and problem list  After consideration of the diagnostic results and the patients response to treatment, I feel that the patent would benefit from discharge.  Right foot pain-likely muscular strain, will provide him with a postop boot, recommend over-the-counter pain medication, follow-up with PCP for further assessment. Homelessness-provide patient with information about shelters within the area.            Final Clinical Impression(s) / ED Diagnoses Final diagnoses:  Right foot pain  Homelessness    Rx / DC Orders ED Discharge Orders     None         Marcello Fennel, PA-C 06/26/22 [id number]    Merryl Hacker, MD 06/26/22 [phone]

## 2022-06-26 NOTE — Discharge Instructions (Addendum)
Looks like you have bruised the end of your toe, please wear postop shoe during the day may take off at nighttime.  Use over-the-counter pain medication as needed  Please follow-up with community health and wellness, or health department.  Come back to the emergency department if you develop chest pain, shortness of breath, severe abdominal pain, uncontrolled nausea, vomiting, diarrhea.

## 2022-06-26 NOTE — ED Notes (Signed)
Patient was found sleeping in ed when this tech arrived, patient stated hed been seen last night but wants to check back in. After checking in patient was seen walking outside towards the parking

## 2022-06-26 NOTE — ED Notes (Signed)
Pt not responding 3x

## 2022-06-26 NOTE — ED Notes (Signed)
Pt not answering to be triage

## 2022-06-27 ENCOUNTER — Ambulatory Visit (HOSPITAL_COMMUNITY): Payer: Medicaid Other

## 2022-06-28 ENCOUNTER — Ambulatory Visit (HOSPITAL_COMMUNITY)
Admission: EM | Admit: 2022-06-28 | Discharge: 2022-06-28 | Disposition: A | Discharge status: Home / Self Care | Payer: Medicaid Other | Attending: Psychiatry | Admitting: Psychiatry

## 2022-06-28 ENCOUNTER — Encounter (HOSPITAL_COMMUNITY): Payer: Self-pay | Admitting: Emergency Medicine

## 2022-06-28 ENCOUNTER — Emergency Department (HOSPITAL_COMMUNITY)
Admission: EM | Admit: 2022-06-28 | Discharge: 2022-06-29 | Disposition: A | Discharge status: Other Acute Inpatient Hospital | Payer: Medicaid Other | Attending: Emergency Medicine | Admitting: Emergency Medicine

## 2022-06-28 ENCOUNTER — Other Ambulatory Visit: Payer: Self-pay

## 2022-06-28 DIAGNOSIS — T50902A Poisoning by unspecified drugs, medicaments and biological substances, intentional self-harm, initial encounter: Secondary | ICD-10-CM | POA: Diagnosis not present

## 2022-06-28 DIAGNOSIS — Z20822 Contact with and (suspected) exposure to covid-19: Secondary | ICD-10-CM | POA: Insufficient documentation

## 2022-06-28 DIAGNOSIS — Z79899 Other long term (current) drug therapy: Secondary | ICD-10-CM | POA: Diagnosis not present

## 2022-06-28 DIAGNOSIS — F250 Schizoaffective disorder, bipolar type: Secondary | ICD-10-CM | POA: Diagnosis present

## 2022-06-28 DIAGNOSIS — R45851 Suicidal ideations: Secondary | ICD-10-CM | POA: Diagnosis not present

## 2022-06-28 DIAGNOSIS — E876 Hypokalemia: Secondary | ICD-10-CM | POA: Diagnosis not present

## 2022-06-28 DIAGNOSIS — Z5900 Homelessness unspecified: Secondary | ICD-10-CM | POA: Insufficient documentation

## 2022-06-28 DIAGNOSIS — C220 Liver cell carcinoma: Secondary | ICD-10-CM | POA: Diagnosis not present

## 2022-06-28 DIAGNOSIS — F32A Depression, unspecified: Secondary | ICD-10-CM | POA: Insufficient documentation

## 2022-06-28 LAB — CBG MONITORING, ED: Glucose-Capillary: 122 mg/dL — ABNORMAL HIGH (ref 70–99)

## 2022-06-28 LAB — CBC WITH DIFFERENTIAL/PLATELET
Abs Immature Granulocytes: 0.04 10*3/uL (ref 0.00–0.07)
Basophils Absolute: 0.1 10*3/uL (ref 0.0–0.1)
Basophils Relative: 1 %
Eosinophils Absolute: 0.3 10*3/uL (ref 0.0–0.5)
Eosinophils Relative: 5 %
HCT: 38.7 % — ABNORMAL LOW (ref 39.0–52.0)
Hemoglobin: 13 g/dL (ref 13.0–17.0)
Immature Granulocytes: 1 %
Lymphocytes Relative: 28 %
Lymphs Abs: 1.6 10*3/uL (ref 0.7–4.0)
MCH: 28.2 pg (ref 26.0–34.0)
MCHC: 33.6 g/dL (ref 30.0–36.0)
MCV: 83.9 fL (ref 80.0–100.0)
Monocytes Absolute: 0.5 10*3/uL (ref 0.1–1.0)
Monocytes Relative: 9 %
Neutro Abs: 3.3 10*3/uL (ref 1.7–7.7)
Neutrophils Relative %: 56 %
Platelets: 298 10*3/uL (ref 150–400)
RBC: 4.61 MIL/uL (ref 4.22–5.81)
RDW: 13.9 % (ref 11.5–15.5)
WBC: 5.7 10*3/uL (ref 4.0–10.5)
nRBC: 0 % (ref 0.0–0.2)

## 2022-06-28 LAB — COMPREHENSIVE METABOLIC PANEL
ALT: 22 U/L (ref 0–44)
AST: 35 U/L (ref 15–41)
Albumin: 3.8 g/dL (ref 3.5–5.0)
Alkaline Phosphatase: 44 U/L (ref 38–126)
Anion gap: 7 (ref 5–15)
BUN: 25 mg/dL — ABNORMAL HIGH (ref 6–20)
CO2: 27 mmol/L (ref 22–32)
Calcium: 8.8 mg/dL — ABNORMAL LOW (ref 8.9–10.3)
Chloride: 106 mmol/L (ref 98–111)
Creatinine, Ser: 0.83 mg/dL (ref 0.61–1.24)
GFR, Estimated: >60 mL/min (ref >60)
Glucose, Bld: 113 mg/dL — ABNORMAL HIGH (ref 70–99)
Potassium: 3.3 mmol/L — ABNORMAL LOW (ref 3.5–5.1)
Sodium: 140 mmol/L (ref 135–145)
Total Bilirubin: 0.6 mg/dL (ref 0.3–1.2)
Total Protein: 6.5 g/dL (ref 6.5–8.1)

## 2022-06-28 LAB — RESP PANEL BY RT-PCR (FLU A&B, COVID) ARPGX2
Influenza A by PCR: NEGATIVE
Influenza B by PCR: NEGATIVE
SARS Coronavirus 2 by RT PCR: NEGATIVE

## 2022-06-28 LAB — ETHANOL: Alcohol, Ethyl (B): <10 mg/dL (ref <10)

## 2022-06-28 LAB — RAPID URINE DRUG SCREEN, HOSP PERFORMED
Amphetamines: NOT DETECTED
Barbiturates: NOT DETECTED
Benzodiazepines: NOT DETECTED
Cocaine: POSITIVE — AB
Opiates: NOT DETECTED
Tetrahydrocannabinol: POSITIVE — AB

## 2022-06-28 LAB — SALICYLATE LEVEL: Salicylate Lvl: <7 mg/dL — ABNORMAL LOW (ref 7.0–30.0)

## 2022-06-28 LAB — ACETAMINOPHEN LEVEL: Acetaminophen (Tylenol), Serum: <10 ug/mL — ABNORMAL LOW (ref 10–30)

## 2022-06-28 MED ORDER — POTASSIUM CHLORIDE CRYS ER 20 MEQ PO TBCR
20.0000 meq | EXTENDED_RELEASE_TABLET | Freq: Once | ORAL | Status: AC
  Administered 2022-06-28: 20 meq via ORAL
  Filled 2022-06-28: qty 1

## 2022-06-28 MED ORDER — SODIUM CHLORIDE 0.9 % IV BOLUS
1000.0000 mL | Freq: Once | INTRAVENOUS | Status: AC
  Administered 2022-06-28: 1000 mL via INTRAVENOUS

## 2022-06-28 NOTE — ED Provider Notes (Signed)
[facility] DEPT Provider Note   CSN: [id number] Arrival date & time: 06/28/22  5732     History  Chief Complaint  Patient presents with   Ingestion    Tyler White is a 26 y.o. male.  HPI 26 year old male presents with an overdose and suicide attempt.  Patient states he took about 3 Zoloft tablets at around 5 AM.  He states this was an attempt to kill himself.  He still feels suicidal.  Patient is sleepy on exam though able to wake up and talk to me.  However he is not participating very much in the history or exam.  Home Medications Prior to Admission medications   Medication Sig Start Date End Date Taking? Authorizing Provider  paliperidone (INVEGA SUSTENNA) 234 MG/1.5ML SUSY injection Inject 234 mg into the muscle once for 1 dose. Patient not taking: Reported on 06/28/2022 04/16/22 06/28/22  Tharon Aquas, NP  gabapentin (NEURONTIN) 400 MG capsule Take 1 capsule (400 mg total) by mouth 3 (three) times daily. Patient not taking: Reported on 06/09/2021 04/30/21 06/10/21  Ethelene Hal, NP      Allergies    Patient has no known allergies.    Review of Systems   Review of Systems  Psychiatric/Behavioral:  Positive for suicidal ideas.     Physical Exam Updated Vital Signs BP 122/76   Pulse (!) 52   Temp 98.4 F (36.9 C) (Oral)   Resp 11   Ht '5\' 8"'$  (1.727 m)   Wt 71.2 kg   SpO2 95%   BMI 23.87 kg/m  Physical Exam Vitals and nursing note reviewed.  Constitutional:      General: He is not in acute distress.    Appearance: He is well-developed. He is not ill-appearing or diaphoretic.  HENT:     Head: Normocephalic and atraumatic.  Eyes:     Pupils: Pupils are equal, round, and reactive to light.  Cardiovascular:     Rate and Rhythm: Normal rate and regular rhythm.     Heart sounds: Normal heart sounds.  Pulmonary:     Effort: Pulmonary effort is normal.     Breath sounds: Normal breath sounds.  Abdominal:      Palpations: Abdomen is soft.     Tenderness: There is no abdominal tenderness.  Skin:    General: Skin is warm and dry.  Neurological:     Mental Status: He is alert.     Comments: Patient is sleepy but will wake up when asked.  He follows commands and has equal strength in all 4 extremities.  However he frequently then wants to turn over and go to sleep.     ED Results / Procedures / Treatments   Labs (all labs ordered are listed, but only abnormal results are displayed) Labs Reviewed  ACETAMINOPHEN LEVEL - Abnormal; Notable for the following components:      Result Value   Acetaminophen (Tylenol), Serum <10 (*)    All other components within normal limits  COMPREHENSIVE METABOLIC PANEL - Abnormal; Notable for the following components:   Potassium 3.3 (*)    Glucose, Bld 113 (*)    BUN 25 (*)    Calcium 8.8 (*)    All other components within normal limits  SALICYLATE LEVEL - Abnormal; Notable for the following components:   Salicylate Lvl <2.0 (*)    All other components within normal limits  CBC WITH DIFFERENTIAL/PLATELET - Abnormal; Notable for the following components:   HCT 38.7 (*)  All other components within normal limits  CBG MONITORING, ED - Abnormal; Notable for the following components:   Glucose-Capillary 122 (*)    All other components within normal limits  RESP PANEL BY RT-PCR (FLU A&B, COVID) ARPGX2  ETHANOL  RAPID URINE DRUG SCREEN, HOSP PERFORMED    EKG EKG Interpretation  Date/Time:  Friday June 28 2022 10:54:16 EDT Ventricular Rate:  57 PR Interval:  155 QRS Duration: 86 QT Interval:  411 QTC Calculation: 401 R Axis:   84 Text Interpretation: Sinus rhythm LVH by voltage ST elev, probable normal early repol pattern similar to June 2023 Confirmed by Sherwood Gambler [phone]) on 06/28/2022 11:05:14 AM  Radiology No results found.  Procedures Procedures    Medications Ordered in ED Medications  sodium chloride 0.9 % bolus 1,000 mL (0 mLs  Intravenous Stopped 06/28/22 1145)    ED Course/ Medical Decision Making/ A&P                           Medical Decision Making Amount and/or Complexity of Data Reviewed External Data Reviewed: notes.    Details: Many prior ED and psychiatric visits. Labs: ordered.    Details: Tylenol level undetectable, mild hypokalemia.  Otherwise labs unremarkable ECG/medicine tests: ordered and independent interpretation performed.    Details: Normal QTc.  Normal QRS   Patient presents with intentional overdose to commit suicide.  He is vaguely suicidal now and has an extensive psychiatric history.  He is medically stable for psychiatric consultation.  He is much more awake and requesting food.        Final Clinical Impression(s) / ED Diagnoses Final diagnoses:  Intentional overdose, initial encounter [facility])    Rx / DC Orders ED Discharge Orders     None         Sherwood Gambler, MD 06/28/22 1606

## 2022-06-28 NOTE — ED Notes (Signed)
Belongings placed in cabinet.

## 2022-06-28 NOTE — Consult Note (Signed)
Another attempt to assess patient failed, he opened his eyes and stated "I just want to sleep"  He however ate Lunch and went back to sleep Nursing reported.  We will try later.

## 2022-06-28 NOTE — Group Note (Unsigned)
LCSW Group Therapy Note   Group Date: 06/28/2022 Start Time: 1300 End Time: 1400   Type of Therapy and Topic:  Group Therapy: Boundaries  Participation Level:  {BHH PARTICIPATION GXQJJ:94174}  Description of Group: This group will address the use of boundaries in their personal lives. Patients will explore why boundaries are important, the difference between healthy and unhealthy boundaries, and negative and postive outcomes of different boundaries and will look at how boundaries can be crossed.  Patients will be encouraged to identify current boundaries in their own lives and identify what kind of boundary is being set. Facilitators will guide patients in utilizing problem-solving interventions to address and correct types boundaries being used and to address when no boundary is being used. Understanding and applying boundaries will be explored and addressed for obtaining and maintaining a balanced life. Patients will be encouraged to explore ways to assertively make their boundaries and needs known to significant others in their lives, using other group members and facilitator for role play, support, and feedback.  Therapeutic Goals:  1.  Patient will identify areas in their life where setting clear boundaries could be  used to improve their life.  2.  Patient will identify signs/triggers that a boundary is not being respected. 3.  Patient will identify two ways to set boundaries in order to achieve balance in  their lives: 4.  Patient will demonstrate ability to communicate their needs and set boundaries  through discussion and/or role plays  Summary of Patient Progress:  *** was ***present/active throughout the session and proved open to feedback from [place] and peers. Patient demonstrated *** insight into the subject matter, was respectful of peers, and was present throughout the entire session.  Therapeutic Modalities:   Cognitive Behavioral Therapy Solution-Focused Therapy  Larose Kells 06/28/2022  3:38 PM

## 2022-06-28 NOTE — Consult Note (Signed)
Attempted to evaluate patient at this time but he is too sedated and could not engage-Opened his eyes and back to sleep.  Will try later.

## 2022-06-28 NOTE — Discharge Instructions (Addendum)
Your next appointment at the [facility] is on 07/03/22 at 1PM. Please take the elevators to the second floor for your appointment.  Homeless Shelter List:  Regulatory affairs officer ([place]) [place], [place] Phone: [phone]  [facility] [address] [place], [place] [address] Phone: [phone]  [facility] (Women only) [address], [place] [address] Phone: [address] [place], [place] [address] Phone: [phone]  [place]: [phone]. [address] [place], [place] [address] Phone: [phone]  [facility] [address] [place], [place] [address] (Check in at 6:00PM for placement at a local shelter) Phone: [phone]   Other resources:  [facility] [place], [place] [address] [phone]

## 2022-06-28 NOTE — ED Provider Notes (Signed)
Behavioral Health Urgent Care Medical Screening Exam  Patient Name: Tyler White No MRN: [id number] Date of Evaluation: 06/28/22 Chief Complaint:   Diagnosis:  Final diagnoses:  Suicidal ideation   History of Present illness: Tyler White is a 26 y.o. male. Pt presents voluntarily to [facility] behavioral health for walk-in assessment.  Pt is assessed face-to-face by nurse practitioner.   Pt w/ hx of schizoaffective disorder, polysubstance abuse. Pt is well known to the psychiatric service line. He has had 31 ED visits in the past 6 months.   Pt reports depressed mood. Initially, pt reports he is presenting today because he is scheduled for his LAI. Pt informed that LAI is administered upstairs in the outpatient clinic and his next appointment is on 07/03/22. Pt then informs me that he is currently homeless and needs a place to stay. Pt informed that this facility does not provide housing although would be able to provide shelter resources. Pt then endorses passive SI, which has been occurring "for years". He denies he has a plan or intent to act on a plan. He denies HI/VI. He denies AVH, paranoia. He denies recent substance use. Pt states his mother did come back to [place] but he cannot stay with her. Pt appears frustrated by assessment questions and states "I feel I need to lie to people to get the things I need". When asked further about this, pt becomes visibly frustrated. Pt refused to answer further assessment questions.   Pt is a&ox3, in no acute distress, non-toxic appearing. He appears disheveled. Eye contact is minimal. Speech is clear and coherent, slow, w/ decreased rate. Reported mood is depressed. Affect is flat. TP is coherent, goal directed, linear. Description of associations is intact. TC is logical. There is no evidence of internal preoccupation, agitation, aggression or distractibility. No delusions or paranoia elicited. Pt appears drowsy, requires multiple prompts to  answer questions.  Psychiatric Specialty Exam  Presentation  General Appearance:Disheveled  Eye Contact:Minimal  Speech:Clear and Coherent; Slow  Speech Volume:Normal  Handedness:Right  Mood and Affect  Mood:Depressed  Affect:Flat  Thought Process  Thought Processes:Coherent; Goal Directed; Linear  Descriptions of Associations:Intact  Orientation:Full (Time, Place and Person)  Thought Content:Logical  Diagnosis of Schizophrenia or Schizoaffective disorder in past: Yes  Duration of Psychotic Symptoms: Greater than six months  Hallucinations:None  Ideas of Reference:None  Suicidal Thoughts:Yes, Passive Without Plan; Without Intent  Homicidal Thoughts:No  Sensorium  Memory:Immediate Good; Recent Fair  Judgment:Intact  Insight:Shallow  Executive Functions  Concentration:Poor  Attention Span:Poor  Hydro  Language:Good  Psychomotor Activity  Psychomotor Activity:Decreased  Assets  Assets:Communication Skills; Desire for Improvement; Financial Resources/Insurance; Resilience  Sleep  Sleep:Poor  Number of hours: 5  No data recorded  Physical Exam: Physical Exam Cardiovascular:     Rate and Rhythm: Normal rate.  Pulmonary:     Effort: Pulmonary effort is normal.  Neurological:     Mental Status: He is alert and oriented to person, place, and time.  Psychiatric:        Attention and Perception: Perception normal.        Mood and Affect: Mood is depressed. Affect is flat.        Behavior: Behavior is combative.        Thought Content: Thought content includes suicidal ideation.    Review of Systems  Constitutional:  Negative for chills and fever.  Respiratory:  Negative for shortness of breath.   Cardiovascular:  Negative for chest pain and palpitations.  Gastrointestinal:  Negative for abdominal pain.  Psychiatric/Behavioral:  Positive for depression and suicidal ideas.    Blood pressure 112/74, pulse 94,  temperature 97.9 F (36.6 C), temperature source Oral, resp. rate 20, SpO2 98 %. There is no height or weight on file to calculate BMI.  Musculoskeletal: Strength & Muscle Tone: within normal limits Gait & Station: normal Patient leans: N/A  [facility] Discharge Disposition for Follow up and Recommendations: Based on my evaluation the patient does not appear to have an emergency medical condition and can be discharged with resources and follow up care in outpatient services  Tharon Aquas, NP 06/28/2022, 8:49 AM

## 2022-06-28 NOTE — ED Notes (Signed)
Pt has been discharged and has left premises

## 2022-06-28 NOTE — ED Triage Notes (Signed)
Pt presents to [facility] voluntarily, unaccompanied at this time with complaints of SI, with a plan to overdose on medications. Pt was last seen at [facility] on 06/24/22. Pt appears very tired and had a hard time staying awake. Pt asked "am I going to get a room in the back". Pt has hx of SI, schizoaffective Disorder. Pt was cooperative at times, but miminal information given as he would not wake up to answer addtional questions that were posed. Pt denies HI, AVH.

## 2022-06-28 NOTE — ED Triage Notes (Signed)
Per [facility] and state pt was released from [facility] this am. States he took a hand full of unknown pills in attempt to harm himself. Reports history of depression.

## 2022-06-29 ENCOUNTER — Ambulatory Visit (HOSPITAL_COMMUNITY)
Admission: EM | Admit: 2022-06-29 | Discharge: 2022-06-29 | Disposition: A | Discharge status: Home / Self Care | Payer: Medicaid Other | Source: Home / Self Care | Attending: Family | Admitting: Family

## 2022-06-29 DIAGNOSIS — F250 Schizoaffective disorder, bipolar type: Secondary | ICD-10-CM

## 2022-06-29 MED ORDER — TRAZODONE HCL 50 MG PO TABS
50.0000 mg | ORAL_TABLET | Freq: Every evening | ORAL | Status: DC | PRN
Start: 2022-06-29 — End: 2022-06-29
  Administered 2022-06-29: 50 mg via ORAL
  Filled 2022-06-29: qty 1

## 2022-06-29 MED ORDER — MAGNESIUM HYDROXIDE 400 MG/5ML PO SUSP: 30.0000 mL | Freq: Every day | ORAL | Status: DC | PRN

## 2022-06-29 MED ORDER — ACETAMINOPHEN 325 MG PO TABS: 650.0000 mg | ORAL_TABLET | Freq: Four times a day (QID) | ORAL | Status: DC | PRN

## 2022-06-29 MED ORDER — ALUM & MAG HYDROXIDE-SIMETH 200-200-20 MG/5ML PO SUSP: 30.0000 mL | ORAL | Status: DC | PRN

## 2022-06-29 NOTE — Discharge Instructions (Signed)
Take all medications as prescribed. Keep all follow-up appointments as scheduled.  Do not consume alcohol or use illegal drugs while on prescription medications. Report any adverse effects from your medications to your primary care provider promptly.  In the event of recurrent symptoms or worsening symptoms, call 911, a crisis hotline, or go to the nearest emergency department for evaluation.   

## 2022-06-29 NOTE — ED Provider Notes (Cosign Needed Addendum)
[facility] Continuous Assessment Admission H&P  Date: 06/29/22 Patient Name: Tyler White MRN: [id number] Chief Complaint: No chief complaint on file.     Diagnoses:  Final diagnoses:  Schizoaffective disorder, bipolar type ([place])    HPI: Tyler Muhammadis a 26 y.o. male with psychiatric history of schizoaffective disorder, bipolar type, cocaine abuse, and malingering. Patient was evaluated at [facility] after overdosing on 3 Zoloft tablets. He was assessed and recommended for continuous assessment at [facility].   This nurse practitioner reviewed patient's chart and met with him face to face upon his arrival to [facility].  Patient is well known to [facility] health psychiatric services; [facility] as [facility].   On assessment, patient is sleeping on chairs in assessment room.  He is in no apparent distress.  He is drowsy but oriented x4. He says he is unable to contract for safety and continues to endorse suicidal ideation with plan to overdose. He denies homicidal ideation, hallucination, and paranoia. He admits to recent cocaine and marijuana use. He says he does not remember the date of last substance use. He says he would like to receive LAI prior to discharge. Per chart review, patient next LAI dose is due on 07/03/2022.   PHQ 2-9:  [facility] ED from 06/19/2022 in [facility] ED from 06/05/2022 in [facility] ED from 03/11/2021 in [place]  Thoughts that you would be better off dead, or of hurting yourself in some way More than half the days Nearly every day Several days  PHQ-9 Total Score '18 18 17       ' Flowsheet Row ED from 06/28/2022 in [place] DEPT ED from 06/25/2022 in [place] ED from 06/19/2022 in [place] CATEGORY High Risk High  Risk High Risk        Total Time spent with patient: chairs in assessment room  Musculoskeletal  Strength & Muscle Tone: within normal limits Gait & Station: normal Patient leans: Right  Psychiatric Specialty Exam  Presentation General Appearance: Disheveled  Eye Contact:Good  Speech:Clear and Coherent  Speech Volume:Normal  Handedness:Right   Mood and Affect  Mood:Euthymic  Affect:Congruent   Thought Process  Thought Processes:Goal Directed  Descriptions of Associations:Intact  Orientation:Full (Time, Place and Person)  Thought Content:WDL  Diagnosis of Schizophrenia or Schizoaffective disorder in past: Yes  Duration of Psychotic Symptoms: Greater than six months  Hallucinations:Hallucinations: None  Ideas of Reference:None  Suicidal Thoughts:Suicidal Thoughts: Yes, Active SI Active Intent and/or Plan: With Plan SI Passive Intent and/or Plan: Without Plan; Without Intent  Homicidal Thoughts:Homicidal Thoughts: No   Sensorium  Memory:Immediate Good; Recent Good; Remote Fair  Judgment:Fair  Insight:Shallow   Executive Functions  Concentration:Fair  Attention Span:Fair  Recall:Poor  Fund of Knowledge:Fair  Language:Fair   Psychomotor Activity  Psychomotor Activity:Psychomotor Activity: Normal   Assets  Assets:Communication Skills; Desire for Improvement; Physical Health   Sleep  Sleep:Sleep: Poor Number of Hours of Sleep: 3   Nutritional Assessment (For OBS and FBC admissions only) Has the patient had a weight loss or gain of 10 pounds or more in the last 3 months?: No Has the patient had a decrease in food intake/or appetite?: No Does the patient have dental problems?: No Does the patient have eating habits or behaviors that may be indicators of an eating disorder including binging or inducing vomiting?:  No Has the patient recently lost weight without trying?: 0 Has the patient been eating poorly because of a decreased  appetite?: 0 Malnutrition Screening Tool Score: 0    Physical Exam Vitals and nursing note reviewed.  Constitutional:      General: He is not in acute distress.    Appearance: He is well-developed. He is not ill-appearing or diaphoretic.  HENT:     Head: Normocephalic and atraumatic.  Eyes:     Conjunctiva/sclera: Conjunctivae normal.  Cardiovascular:     Rate and Rhythm: Normal rate.  Pulmonary:     Effort: Pulmonary effort is normal. No respiratory distress.     Breath sounds: Normal breath sounds.  Abdominal:     Palpations: Abdomen is soft.     Tenderness: There is no abdominal tenderness.  Musculoskeletal:        General: No swelling.     Cervical back: Neck supple.  Skin:    General: Skin is warm and dry.     Capillary Refill: Capillary refill takes less than 2 seconds.  Neurological:     Mental Status: He is alert and oriented to person, place, and time.  Psychiatric:        Attention and Perception: Attention and perception normal.        Mood and Affect: Mood normal.        Speech: Speech normal.        Behavior: Behavior normal. Behavior is cooperative.        Thought Content: Thought content is not paranoid or delusional. Thought content includes suicidal ideation. Thought content does not include homicidal ideation. Thought content includes suicidal plan. Thought content does not include homicidal plan.    Review of Systems  Constitutional: Negative.   HENT: Negative.    Eyes: Negative.   Respiratory: Negative.    Cardiovascular: Negative.   Gastrointestinal: Negative.   Genitourinary: Negative.   Musculoskeletal: Negative.   Skin: Negative.   Neurological: Negative.   Endo/Heme/Allergies: Negative.   Psychiatric/Behavioral:  Positive for substance abuse and suicidal ideas.     Blood pressure 124/83, pulse (!) 56, temperature 97.9 F (36.6 C), temperature source Oral, resp. rate 20, SpO2 99 %. There is no height or weight on file to calculate  BMI.  Past Psychiatric History:    Is the patient at risk to self? Yes  Has the patient been a risk to self in the past 6 months? Yes .    Has the patient been a risk to self within the distant past? Yes   Is the patient a risk to others? No   Has the patient been a risk to others in the past 6 months? No   Has the patient been a risk to others within the distant past? No   Past Medical History:  Past Medical History:  Diagnosis Date   Hernia, inguinal, right    Inguinal hernia    right   Psychiatric illness    Schizophrenia ([place])    No past surgical history on file.  Family History:  Family History  Problem Relation Age of Onset   Psychiatric Illness Mother    Hypertension Sister     Social History:  Social History   Socioeconomic History   Marital status: Single    Spouse name: Not on file   Number of children: Not on file   Years of education: 10   Highest education level: Not on file  Occupational History   Not on file  Tobacco Use   Smoking status: Some Days    Packs/day: 0.50    Years: 5.00    Total pack years: 2.50    Types: Cigarettes   Smokeless tobacco: Never  Vaping Use   Vaping Use: Never used  Substance and Sexual Activity   Alcohol use: Not Currently   Drug use: Not Currently    Types: Marijuana, Cocaine    Comment: crack/cocaine occasionally   Sexual activity: Yes    Birth control/protection: None  Other Topics Concern   Not on file  Social History Narrative   ** Merged History Encounter **       ** Merged History Encounter **       Social Determinants of Health   Financial Resource Strain: Not on file  Food Insecurity: Not on file  Transportation Needs: Not on file  Physical Activity: Not on file  Stress: Not on file  Social Connections: Not on file  Intimate Partner Violence: Not on file    SDOH:  SDOH Screenings   Alcohol Screen: Low Risk  (01/29/2022)   Alcohol Screen    Last Alcohol Screening Score (AUDIT): 0   Depression (PHQ2-9): Medium Risk (06/19/2022)   Depression (PHQ2-9)    PHQ-2 Score: 18  Financial Resource Strain: Not on file  Food Insecurity: Not on file  Housing: Not on file  Physical Activity: Not on file  Social Connections: Not on file  Stress: Not on file  Tobacco Use: High Risk (06/28/2022)   Patient History    Smoking Tobacco Use: Some Days    Smokeless Tobacco Use: Never    Passive Exposure: Not on file  Transportation Needs: Not on file    Last Labs:  Admission on 06/28/2022, Discharged on 06/29/2022  Component Date Value Ref Range Status   Acetaminophen (Tylenol), Serum 06/28/2022 <10 (L)  10 - 30 ug/mL Final   Comment: (NOTE) Therapeutic concentrations vary significantly. A range of 10-30 ug/mL  may be an effective concentration for many patients. However, some  are best treated at concentrations outside of this range. Acetaminophen concentrations >150 ug/mL at 4 hours after ingestion  and >50 ug/mL at 12 hours after ingestion are often associated with  toxic reactions.  Performed at [facility], [place] [address], [place], [place] [address]    Sodium 06/28/2022 140  135 - 145 mmol/L Final   Potassium 06/28/2022 3.3 (L)  3.5 - 5.1 mmol/L Final   Chloride 06/28/2022 106  98 - 111 mmol/L Final   CO2 06/28/2022 27  22 - 32 mmol/L Final   Glucose, Bld 06/28/2022 113 (H)  70 - 99 mg/dL Final   Glucose reference range applies only to samples taken after fasting for at least 8 hours.   BUN 06/28/2022 25 (H)  6 - 20 mg/dL Final   Creatinine, Ser 06/28/2022 0.83  0.61 - 1.24 mg/dL Final   Calcium 06/28/2022 8.8 (L)  8.9 - 10.3 mg/dL Final   Total Protein 06/28/2022 6.5  6.5 - 8.1 g/dL Final   Albumin 06/28/2022 3.8  3.5 - 5.0 g/dL Final   AST 06/28/2022 35  15 - 41 U/L Final   ALT 06/28/2022 22  0 - 44 U/L Final   Alkaline Phosphatase 06/28/2022 44  38 - 126 U/L Final   Total Bilirubin 06/28/2022 0.6  0.3 - 1.2 mg/dL Final   GFR, Estimated  06/28/2022 >60  >60 mL/min Final   Comment: (NOTE) Calculated using the CKD-EPI Creatinine Equation (2021)  Anion gap 06/28/2022 7  5 - 15 Final   Performed at [facility], [place] [address] [place], [place] [address]   Alcohol, Ethyl (B) 06/28/2022 <10  <10 mg/dL Final   Comment: (NOTE) Lowest detectable limit for serum alcohol is 10 mg/dL.  For medical purposes only. Performed at [facility], [place] [address] [place], [place] [address]    Opiates 06/28/2022 NONE DETECTED  NONE DETECTED Final   Cocaine 06/28/2022 POSITIVE (A)  NONE DETECTED Final   Benzodiazepines 06/28/2022 NONE DETECTED  NONE DETECTED Final   Amphetamines 06/28/2022 NONE DETECTED  NONE DETECTED Final   Tetrahydrocannabinol 06/28/2022 POSITIVE (A)  NONE DETECTED Final   Barbiturates 06/28/2022 NONE DETECTED  NONE DETECTED Final   Comment: (NOTE) DRUG SCREEN FOR MEDICAL PURPOSES ONLY.  IF CONFIRMATION IS NEEDED FOR ANY PURPOSE, NOTIFY LAB WITHIN 5 DAYS.  LOWEST DETECTABLE LIMITS FOR URINE DRUG SCREEN Drug Class                     Cutoff (ng/mL) Amphetamine and metabolites    1000 Barbiturate and metabolites    200 Benzodiazepine                 546 Tricyclics and metabolites     300 Opiates and metabolites        300 Cocaine and metabolites        300 THC                            50 Performed at [facility], [place] [address], [place], [place] [address]    Salicylate Lvl 65/68/1275 <7.0 (L)  7.0 - 30.0 mg/dL Final   Performed at [place] [address] [place], [place] [address]   WBC 06/28/2022 5.7  4.0 - 10.5 K/uL Final   RBC 06/28/2022 4.61  4.22 - 5.81 MIL/uL Final   Hemoglobin 06/28/2022 13.0  13.0 - 17.0 g/dL Final   HCT 06/28/2022 38.7 (L)  39.0 - 52.0 % Final   MCV 06/28/2022 83.9  80.0 - 100.0 fL Final   MCH 06/28/2022 28.2  26.0 - 34.0 pg Final   MCHC 06/28/2022 33.6  30.0 - 36.0 g/dL Final   RDW 06/28/2022  13.9  11.5 - 15.5 % Final   Platelets 06/28/2022 298  150 - 400 K/uL Final   nRBC 06/28/2022 0.0  0.0 - 0.2 % Final   Neutrophils Relative % 06/28/2022 56  % Final   Neutro Abs 06/28/2022 3.3  1.7 - 7.7 K/uL Final   Lymphocytes Relative 06/28/2022 28  % Final   Lymphs Abs 06/28/2022 1.6  0.7 - 4.0 K/uL Final   Monocytes Relative 06/28/2022 9  % Final   Monocytes Absolute 06/28/2022 0.5  0.1 - 1.0 K/uL Final   Eosinophils Relative 06/28/2022 5  % Final   Eosinophils Absolute 06/28/2022 0.3  0.0 - 0.5 K/uL Final   Basophils Relative 06/28/2022 1  % Final   Basophils Absolute 06/28/2022 0.1  0.0 - 0.1 K/uL Final   Immature Granulocytes 06/28/2022 1  % Final   Abs Immature Granulocytes 06/28/2022 0.04  0.00 - 0.07 K/uL Final   Performed at [facility], [place] [address] [place], [place] [address]   Glucose-Capillary 06/28/2022 122 (H)  70 - 99 mg/dL Final   Glucose reference range applies only to samples taken after fasting for at least 8 hours.  SARS Coronavirus 2 by RT PCR 06/28/2022 NEGATIVE  NEGATIVE Final   Comment: (NOTE) SARS-CoV-2 target nucleic acids are NOT DETECTED.  The SARS-CoV-2 RNA is generally detectable in upper respiratory specimens during the acute phase of infection. The lowest concentration of SARS-CoV-2 viral copies this assay can detect is 138 copies/mL. A negative result does not preclude SARS-Cov-2 infection and should not be used as the sole basis for treatment or other patient management decisions. A negative result may occur with  improper specimen collection/handling, submission of specimen other than nasopharyngeal swab, presence of viral mutation(s) within the areas targeted by this assay, and inadequate number of viral copies(<138 copies/mL). A negative result must be combined with clinical observations, patient history, and epidemiological information. The expected result is Negative.  Fact Sheet for Patients:   EntrepreneurPulse.com.au  Fact Sheet for Healthcare Providers:  IncredibleEmployment.be  This test is no                          t yet approved or cleared by the [place] FDA and  has been authorized for detection and/or diagnosis of SARS-CoV-2 by FDA under an Emergency Use Authorization (EUA). This EUA will remain  in effect (meaning this test can be used) for the duration of the COVID-19 declaration under Section 564(b)(1) of the Act, 21 U.S.C.section 360bbb-3(b)(1), unless the authorization is terminated  or revoked sooner.       Influenza A by PCR 06/28/2022 NEGATIVE  NEGATIVE Final   Influenza B by PCR 06/28/2022 NEGATIVE  NEGATIVE Final   Comment: (NOTE) The Xpert Xpress SARS-CoV-2/FLU/RSV plus assay is intended as an aid in the diagnosis of influenza from Nasopharyngeal swab specimens and should not be used as a sole basis for treatment. Nasal washings and aspirates are unacceptable for Xpert Xpress SARS-CoV-2/FLU/RSV testing.  Fact Sheet for Patients: EntrepreneurPulse.com.au  Fact Sheet for Healthcare Providers: IncredibleEmployment.be  This test is not yet approved or cleared by the [place] FDA and has been authorized for detection and/or diagnosis of SARS-CoV-2 by FDA under an Emergency Use Authorization (EUA). This EUA will remain in effect (meaning this test can be used) for the duration of the COVID-19 declaration under Section 564(b)(1) of the Act, 21 U.S.C. section 360bbb-3(b)(1), unless the authorization is terminated or revoked.  Performed at [facility], [place] [address], [place], [place] [address]   Admission on 06/15/2022, Discharged on 06/16/2022  Component Date Value Ref Range Status   Sodium 06/15/2022 140  135 - 145 mmol/L Final   Potassium 06/15/2022 3.6  3.5 - 5.1 mmol/L Final   Chloride 06/15/2022 109  98 - 111 mmol/L Final   CO2 06/15/2022  27  22 - 32 mmol/L Final   Glucose, Bld 06/15/2022 74  70 - 99 mg/dL Final   Glucose reference range applies only to samples taken after fasting for at least 8 hours.   BUN 06/15/2022 17  6 - 20 mg/dL Final   Creatinine, Ser 06/15/2022 1.00  0.61 - 1.24 mg/dL Final   Calcium 06/15/2022 9.2  8.9 - 10.3 mg/dL Final   Total Protein 06/15/2022 6.9  6.5 - 8.1 g/dL Final   Albumin 06/15/2022 4.0  3.5 - 5.0 g/dL Final   AST 06/15/2022 29  15 - 41 U/L Final   ALT 06/15/2022 24  0 - 44 U/L Final   Alkaline Phosphatase 06/15/2022 48  38 - 126 U/L Final   Total Bilirubin 06/15/2022 0.5  0.3 -  1.2 mg/dL Final   GFR, Estimated 06/15/2022 >60  >60 mL/min Final   Comment: (NOTE) Calculated using the CKD-EPI Creatinine Equation (2021)    Anion gap 06/15/2022 4 (L)  5 - 15 Final   Performed at [facility], [place] [address] [place], [place] [address]   Alcohol, Ethyl (B) 06/15/2022 <10  <10 mg/dL Final   Comment: (NOTE) Lowest detectable limit for serum alcohol is 10 mg/dL.  For medical purposes only. Performed at [facility], [place] [address] [place], [place] [address]    Salicylate Lvl 94/85/4627 <7.0 (L)  7.0 - 30.0 mg/dL Final   Performed at [place] [address] [place], [place] [address]   Acetaminophen (Tylenol), Serum 06/15/2022 <10 (L)  10 - 30 ug/mL Final   Comment: (NOTE) Therapeutic concentrations vary significantly. A range of 10-30 ug/mL  may be an effective concentration for many patients. However, some  are best treated at concentrations outside of this range. Acetaminophen concentrations >150 ug/mL at 4 hours after ingestion  and >50 ug/mL at 12 hours after ingestion are often associated with  toxic reactions.  Performed at [facility], [place] [address] [place], [place] [address]    WBC 06/15/2022 7.9  4.0 - 10.5 K/uL Final   RBC 06/15/2022 4.62  4.22 - 5.81 MIL/uL Final   Hemoglobin  06/15/2022 13.2  13.0 - 17.0 g/dL Final   HCT 06/15/2022 39.8  39.0 - 52.0 % Final   MCV 06/15/2022 86.1  80.0 - 100.0 fL Final   MCH 06/15/2022 28.6  26.0 - 34.0 pg Final   MCHC 06/15/2022 33.2  30.0 - 36.0 g/dL Final   RDW 06/15/2022 13.2  11.5 - 15.5 % Final   Platelets 06/15/2022 268  150 - 400 K/uL Final   nRBC 06/15/2022 0.0  0.0 - 0.2 % Final   Performed at [facility], [place] [address], [place], [place] [address]   Opiates 06/15/2022 NONE DETECTED  NONE DETECTED Final   Cocaine 06/15/2022 POSITIVE (A)  NONE DETECTED Final   Benzodiazepines 06/15/2022 NONE DETECTED  NONE DETECTED Final   Amphetamines 06/15/2022 POSITIVE (A)  NONE DETECTED Final   Tetrahydrocannabinol 06/15/2022 POSITIVE (A)  NONE DETECTED Final   Barbiturates 06/15/2022 NONE DETECTED  NONE DETECTED Final   Comment: (NOTE) DRUG SCREEN FOR MEDICAL PURPOSES ONLY.  IF CONFIRMATION IS NEEDED FOR ANY PURPOSE, NOTIFY LAB WITHIN 5 DAYS.  LOWEST DETECTABLE LIMITS FOR URINE DRUG SCREEN Drug Class                     Cutoff (ng/mL) Amphetamine and metabolites    1000 Barbiturate and metabolites    200 Benzodiazepine                 169 Tricyclics and metabolites     300 Opiates and metabolites        300 Cocaine and metabolites        300 THC                            50 Performed at [facility], [place] [address], [place], [place] [address]    SARS Coronavirus 2 by RT PCR 06/15/2022 NEGATIVE  NEGATIVE Final   Comment: (NOTE) SARS-CoV-2 target nucleic acids are NOT DETECTED.  The SARS-CoV-2 RNA is generally detectable in upper respiratory specimens during the acute phase of infection. The lowest  concentration of SARS-CoV-2 viral copies this assay can detect is 138 copies/mL. A negative result does not preclude SARS-Cov-2 infection and should not be used as the sole basis for treatment or other patient management decisions. A negative result may occur with  improper specimen  collection/handling, submission of specimen other than nasopharyngeal swab, presence of viral mutation(s) within the areas targeted by this assay, and inadequate number of viral copies(<138 copies/mL). A negative result must be combined with clinical observations, patient history, and epidemiological information. The expected result is Negative.  Fact Sheet for Patients:  EntrepreneurPulse.com.au  Fact Sheet for Healthcare Providers:  IncredibleEmployment.be  This test is no                          t yet approved or cleared by the [place] FDA and  has been authorized for detection and/or diagnosis of SARS-CoV-2 by FDA under an Emergency Use Authorization (EUA). This EUA will remain  in effect (meaning this test can be used) for the duration of the COVID-19 declaration under Section 564(b)(1) of the Act, 21 U.S.C.section 360bbb-3(b)(1), unless the authorization is terminated  or revoked sooner.       Influenza A by PCR 06/15/2022 NEGATIVE  NEGATIVE Final   Influenza B by PCR 06/15/2022 NEGATIVE  NEGATIVE Final   Comment: (NOTE) The Xpert Xpress SARS-CoV-2/FLU/RSV plus assay is intended as an aid in the diagnosis of influenza from Nasopharyngeal swab specimens and should not be used as a sole basis for treatment. Nasal washings and aspirates are unacceptable for Xpert Xpress SARS-CoV-2/FLU/RSV testing.  Fact Sheet for Patients: EntrepreneurPulse.com.au  Fact Sheet for Healthcare Providers: IncredibleEmployment.be  This test is not yet approved or cleared by the [place] FDA and has been authorized for detection and/or diagnosis of SARS-CoV-2 by FDA under an Emergency Use Authorization (EUA). This EUA will remain in effect (meaning this test can be used) for the duration of the COVID-19 declaration under Section 564(b)(1) of the Act, 21 U.S.C. section 360bbb-3(b)(1), unless the authorization is  terminated or revoked.  Performed at [facility], [place] [address], [place], [place] [address]   Admission on 06/11/2022, Discharged on 06/12/2022  Component Date Value Ref Range Status   SARS Coronavirus 2 by RT PCR 06/12/2022 NEGATIVE  NEGATIVE Final   Comment: (NOTE) SARS-CoV-2 target nucleic acids are NOT DETECTED.  The SARS-CoV-2 RNA is generally detectable in upper respiratory specimens during the acute phase of infection. The lowest concentration of SARS-CoV-2 viral copies this assay can detect is 138 copies/mL. A negative result does not preclude SARS-Cov-2 infection and should not be used as the sole basis for treatment or other patient management decisions. A negative result may occur with  improper specimen collection/handling, submission of specimen other than nasopharyngeal swab, presence of viral mutation(s) within the areas targeted by this assay, and inadequate number of viral copies(<138 copies/mL). A negative result must be combined with clinical observations, patient history, and epidemiological information. The expected result is Negative.  Fact Sheet for Patients:  EntrepreneurPulse.com.au  Fact Sheet for Healthcare Providers:  IncredibleEmployment.be  This test is no                          t yet approved or cleared by the [place] FDA and  has been authorized for detection and/or diagnosis of SARS-CoV-2 by FDA under an Emergency Use Authorization (EUA). This EUA will remain  in effect (  meaning this test can be used) for the duration of the COVID-19 declaration under Section 564(b)(1) of the Act, 21 U.S.C.section 360bbb-3(b)(1), unless the authorization is terminated  or revoked sooner.       Influenza A by PCR 06/12/2022 NEGATIVE  NEGATIVE Final   Influenza B by PCR 06/12/2022 NEGATIVE  NEGATIVE Final   Comment: (NOTE) The Xpert Xpress SARS-CoV-2/FLU/RSV plus assay is intended as an aid in  the diagnosis of influenza from Nasopharyngeal swab specimens and should not be used as a sole basis for treatment. Nasal washings and aspirates are unacceptable for Xpert Xpress SARS-CoV-2/FLU/RSV testing.  Fact Sheet for Patients: EntrepreneurPulse.com.au  Fact Sheet for Healthcare Providers: IncredibleEmployment.be  This test is not yet approved or cleared by the [place] FDA and has been authorized for detection and/or diagnosis of SARS-CoV-2 by FDA under an Emergency Use Authorization (EUA). This EUA will remain in effect (meaning this test can be used) for the duration of the COVID-19 declaration under Section 564(b)(1) of the Act, 21 U.S.C. section 360bbb-3(b)(1), unless the authorization is terminated or revoked.  Performed at [facility], [place] [address], [place], [place] [address]    Sodium 06/12/2022 139  135 - 145 mmol/L Final   Potassium 06/12/2022 3.5  3.5 - 5.1 mmol/L Final   Chloride 06/12/2022 102  98 - 111 mmol/L Final   CO2 06/12/2022 25  22 - 32 mmol/L Final   Glucose, Bld 06/12/2022 93  70 - 99 mg/dL Final   Glucose reference range applies only to samples taken after fasting for at least 8 hours.   BUN 06/12/2022 19  6 - 20 mg/dL Final   Creatinine, Ser 06/12/2022 1.10  0.61 - 1.24 mg/dL Final   Calcium 06/12/2022 9.7  8.9 - 10.3 mg/dL Final   Total Protein 06/12/2022 7.0  6.5 - 8.1 g/dL Final   Albumin 06/12/2022 4.1  3.5 - 5.0 g/dL Final   AST 06/12/2022 52 (H)  15 - 41 U/L Final   ALT 06/12/2022 31  0 - 44 U/L Final   Alkaline Phosphatase 06/12/2022 44  38 - 126 U/L Final   Total Bilirubin 06/12/2022 1.2  0.3 - 1.2 mg/dL Final   GFR, Estimated 06/12/2022 >60  >60 mL/min Final   Comment: (NOTE) Calculated using the CKD-EPI Creatinine Equation (2021)    Anion gap 06/12/2022 12  5 - 15 Final   Performed at [facility] [address], [place], [place] [address]   Alcohol, Ethyl (B) 06/12/2022 <10  <10  mg/dL Final   Comment: (NOTE) Lowest detectable limit for serum alcohol is 10 mg/dL.  For medical purposes only. Performed at [facility], [place] [address], [place], [place] [address]    Opiates 06/12/2022 NONE DETECTED  NONE DETECTED Final   Cocaine 06/12/2022 POSITIVE (A)  NONE DETECTED Final   Benzodiazepines 06/12/2022 NONE DETECTED  NONE DETECTED Final   Amphetamines 06/12/2022 POSITIVE (A)  NONE DETECTED Final   Tetrahydrocannabinol 06/12/2022 POSITIVE (A)  NONE DETECTED Final   Barbiturates 06/12/2022 NONE DETECTED  NONE DETECTED Final   Comment: (NOTE) DRUG SCREEN FOR MEDICAL PURPOSES ONLY.  IF CONFIRMATION IS NEEDED FOR ANY PURPOSE, NOTIFY LAB WITHIN 5 DAYS.  LOWEST DETECTABLE LIMITS FOR URINE DRUG SCREEN Drug Class                     Cutoff (ng/mL) Amphetamine and metabolites    1000 Barbiturate and metabolites    200 Benzodiazepine  664 Tricyclics and metabolites     300 Opiates and metabolites        300 Cocaine and metabolites        300 THC                            50 Performed at [facility], [place] [address], [place], [place] [address]    WBC 06/12/2022 13.0 (H)  4.0 - 10.5 K/uL Final   RBC 06/12/2022 4.68  4.22 - 5.81 MIL/uL Final   Hemoglobin 06/12/2022 13.6  13.0 - 17.0 g/dL Final   HCT 06/12/2022 39.1  39.0 - 52.0 % Final   MCV 06/12/2022 83.5  80.0 - 100.0 fL Final   MCH 06/12/2022 29.1  26.0 - 34.0 pg Final   MCHC 06/12/2022 34.8  30.0 - 36.0 g/dL Final   RDW 06/12/2022 13.3  11.5 - 15.5 % Final   Platelets 06/12/2022 267  150 - 400 K/uL Final   nRBC 06/12/2022 0.0  0.0 - 0.2 % Final   Neutrophils Relative % 06/12/2022 80  % Final   Neutro Abs 06/12/2022 10.4 (H)  1.7 - 7.7 K/uL Final   Lymphocytes Relative 06/12/2022 11  % Final   Lymphs Abs 06/12/2022 1.4  0.7 - 4.0 K/uL Final   Monocytes Relative 06/12/2022 8  % Final   Monocytes Absolute 06/12/2022 1.1 (H)  0.1 - 1.0 K/uL Final   Eosinophils Relative 06/12/2022 0   % Final   Eosinophils Absolute 06/12/2022 0.0  0.0 - 0.5 K/uL Final   Basophils Relative 06/12/2022 1  % Final   Basophils Absolute 06/12/2022 0.1  0.0 - 0.1 K/uL Final   Immature Granulocytes 06/12/2022 0  % Final   Abs Immature Granulocytes 06/12/2022 0.05  0.00 - 0.07 K/uL Final   Performed at [facility], [place] [address] [place], [place] [address]  Admission on 06/05/2022, Discharged on 06/07/2022  Component Date Value Ref Range Status   SARS Coronavirus 2 by RT PCR 06/05/2022 NEGATIVE  NEGATIVE Final   Comment: (NOTE) SARS-CoV-2 target nucleic acids are NOT DETECTED.  The SARS-CoV-2 RNA is generally detectable in upper respiratory specimens during the acute phase of infection. The lowest concentration of SARS-CoV-2 viral copies this assay can detect is 138 copies/mL. A negative result does not preclude SARS-Cov-2 infection and should not be used as the sole basis for treatment or other patient management decisions. A negative result may occur with  improper specimen collection/handling, submission of specimen other than nasopharyngeal swab, presence of viral mutation(s) within the areas targeted by this assay, and inadequate number of viral copies(<138 copies/mL). A negative result must be combined with clinical observations, patient history, and epidemiological information. The expected result is Negative.  Fact Sheet for Patients:  EntrepreneurPulse.com.au  Fact Sheet for Healthcare Providers:  IncredibleEmployment.be  This test is no                          t yet approved or cleared by the [place] FDA and  has been authorized for detection and/or diagnosis of SARS-CoV-2 by FDA under an Emergency Use Authorization (EUA). This EUA will remain  in effect (meaning this test can be used) for the duration of the COVID-19 declaration under Section 564(b)(1) of the Act, 21 U.S.C.section 360bbb-3(b)(1), unless the authorization is  terminated  or revoked sooner.       Influenza A by  PCR 06/05/2022 NEGATIVE  NEGATIVE Final   Influenza B by PCR 06/05/2022 NEGATIVE  NEGATIVE Final   Comment: (NOTE) The Xpert Xpress SARS-CoV-2/FLU/RSV plus assay is intended as an aid in the diagnosis of influenza from Nasopharyngeal swab specimens and should not be used as a sole basis for treatment. Nasal washings and aspirates are unacceptable for Xpert Xpress SARS-CoV-2/FLU/RSV testing.  Fact Sheet for Patients: EntrepreneurPulse.com.au  Fact Sheet for Healthcare Providers: IncredibleEmployment.be  This test is not yet approved or cleared by the [place] FDA and has been authorized for detection and/or diagnosis of SARS-CoV-2 by FDA under an Emergency Use Authorization (EUA). This EUA will remain in effect (meaning this test can be used) for the duration of the COVID-19 declaration under Section 564(b)(1) of the Act, 21 U.S.C. section 360bbb-3(b)(1), unless the authorization is terminated or revoked.  Performed at [facility], [place] [address] [place], [place] [address]    WBC 06/05/2022 9.6  4.0 - 10.5 K/uL Final   RBC 06/05/2022 5.08  4.22 - 5.81 MIL/uL Final   Hemoglobin 06/05/2022 14.5  13.0 - 17.0 g/dL Final   HCT 06/05/2022 42.3  39.0 - 52.0 % Final   MCV 06/05/2022 83.3  80.0 - 100.0 fL Final   MCH 06/05/2022 28.5  26.0 - 34.0 pg Final   MCHC 06/05/2022 34.3  30.0 - 36.0 g/dL Final   RDW 06/05/2022 13.6  11.5 - 15.5 % Final   Platelets 06/05/2022 323  150 - 400 K/uL Final   nRBC 06/05/2022 0.0  0.0 - 0.2 % Final   Neutrophils Relative % 06/05/2022 67  % Final   Neutro Abs 06/05/2022 6.5  1.7 - 7.7 K/uL Final   Lymphocytes Relative 06/05/2022 23  % Final   Lymphs Abs 06/05/2022 2.2  0.7 - 4.0 K/uL Final   Monocytes Relative 06/05/2022 7  % Final   Monocytes Absolute 06/05/2022 0.7  0.1 - 1.0 K/uL Final   Eosinophils Relative 06/05/2022 2  % Final   Eosinophils  Absolute 06/05/2022 0.2  0.0 - 0.5 K/uL Final   Basophils Relative 06/05/2022 1  % Final   Basophils Absolute 06/05/2022 0.1  0.0 - 0.1 K/uL Final   Immature Granulocytes 06/05/2022 0  % Final   Abs Immature Granulocytes 06/05/2022 0.02  0.00 - 0.07 K/uL Final   Performed at [facility], [place] [address], [place], [place] [address]   Sodium 06/05/2022 136  135 - 145 mmol/L Final   Potassium 06/05/2022 3.6  3.5 - 5.1 mmol/L Final   Chloride 06/05/2022 100  98 - 111 mmol/L Final   CO2 06/05/2022 27  22 - 32 mmol/L Final   Glucose, Bld 06/05/2022 90  70 - 99 mg/dL Final   Glucose reference range applies only to samples taken after fasting for at least 8 hours.   BUN 06/05/2022 15  6 - 20 mg/dL Final   Creatinine, Ser 06/05/2022 1.01  0.61 - 1.24 mg/dL Final   Calcium 06/05/2022 9.6  8.9 - 10.3 mg/dL Final   Total Protein 06/05/2022 6.3 (L)  6.5 - 8.1 g/dL Final   Albumin 06/05/2022 3.8  3.5 - 5.0 g/dL Final   AST 06/05/2022 41  15 - 41 U/L Final   ALT 06/05/2022 28  0 - 44 U/L Final   Alkaline Phosphatase 06/05/2022 48  38 - 126 U/L Final   Total Bilirubin 06/05/2022 0.5  0.3 - 1.2 mg/dL Final   GFR, Estimated 06/05/2022 >60  >60 mL/min Final  Comment: (NOTE) Calculated using the CKD-EPI Creatinine Equation (2021)    Anion gap 06/05/2022 9  5 - 15 Final   Performed at [facility], [place] [address] [place], [place] [address]   Alcohol, Ethyl (B) 06/05/2022 <10  <10 mg/dL Final   Comment: (NOTE) Lowest detectable limit for serum alcohol is 10 mg/dL.  For medical purposes only. Performed at [facility], [place] [address] [place], [place] [address]    Color, Urine 06/05/2022 YELLOW  YELLOW Final   APPearance 06/05/2022 CLEAR  CLEAR Final   Specific Gravity, Urine 06/05/2022 1.012  1.005 - 1.030 Final   pH 06/05/2022 6.0  5.0 - 8.0 Final   Glucose, UA 06/05/2022 NEGATIVE  NEGATIVE mg/dL Final   Hgb urine dipstick 06/05/2022 NEGATIVE  NEGATIVE Final   Bilirubin Urine  06/05/2022 NEGATIVE  NEGATIVE Final   Ketones, ur 06/05/2022 NEGATIVE  NEGATIVE mg/dL Final   Protein, ur 06/05/2022 NEGATIVE  NEGATIVE mg/dL Final   Nitrite 06/05/2022 NEGATIVE  NEGATIVE Final   Leukocytes,Ua 06/05/2022 SMALL (A)  NEGATIVE Final   RBC / HPF 06/05/2022 0-5  0 - 5 RBC/hpf Final   WBC, UA 06/05/2022 11-20  0 - 5 WBC/hpf Final   Bacteria, UA 06/05/2022 FEW (A)  NONE SEEN Final   Squamous Epithelial / LPF 06/05/2022 0-5  0 - 5 Final   Mucus 06/05/2022 PRESENT   Final   Performed at [facility], [place] [address] [place], [place] [address]   POC Amphetamine UR 06/05/2022 None Detected  NONE DETECTED (Cut Off Level 1000 ng/mL) Final   POC Secobarbital (BAR) 06/05/2022 None Detected  NONE DETECTED (Cut Off Level 300 ng/mL) Final   POC Buprenorphine (BUP) 06/05/2022 None Detected  NONE DETECTED (Cut Off Level 10 ng/mL) Final   POC Oxazepam (BZO) 06/05/2022 None Detected  NONE DETECTED (Cut Off Level 300 ng/mL) Final   POC Cocaine UR 06/05/2022 Positive (A)  NONE DETECTED (Cut Off Level 300 ng/mL) Final   POC Methamphetamine UR 06/05/2022 None Detected  NONE DETECTED (Cut Off Level 1000 ng/mL) Final   POC Morphine 06/05/2022 None Detected  NONE DETECTED (Cut Off Level 300 ng/mL) Final   POC Methadone UR 06/05/2022 None Detected  NONE DETECTED (Cut Off Level 300 ng/mL) Final   POC Oxycodone UR 06/05/2022 Positive (A)  NONE DETECTED (Cut Off Level 100 ng/mL) Final   POC Marijuana UR 06/05/2022 Positive (A)  NONE DETECTED (Cut Off Level 50 ng/mL) Final  Admission on 05/30/2022, Discharged on 05/30/2022  Component Date Value Ref Range Status   SARS Coronavirus 2 by RT PCR 05/30/2022 NEGATIVE  NEGATIVE Final   Comment: (NOTE) SARS-CoV-2 target nucleic acids are NOT DETECTED.  The SARS-CoV-2 RNA is generally detectable in upper respiratory specimens during the acute phase of infection. The lowest concentration of SARS-CoV-2 viral copies this assay can detect is 138 copies/mL. A  negative result does not preclude SARS-Cov-2 infection and should not be used as the sole basis for treatment or other patient management decisions. A negative result may occur with  improper specimen collection/handling, submission of specimen other than nasopharyngeal swab, presence of viral mutation(s) within the areas targeted by this assay, and inadequate number of viral copies(<138 copies/mL). A negative result must be combined with clinical observations, patient history, and epidemiological information. The expected result is Negative.  Fact Sheet for Patients:  EntrepreneurPulse.com.au  Fact Sheet for Healthcare Providers:  IncredibleEmployment.be  This test is no  t yet approved or cleared by the [place] and  has been authorized for detection and/or diagnosis of SARS-CoV-2 by FDA under an Emergency Use Authorization (EUA). This EUA will remain  in effect (meaning this test can be used) for the duration of the COVID-19 declaration under Section 564(b)(1) of the Act, 21 U.S.C.section 360bbb-3(b)(1), unless the authorization is terminated  or revoked sooner.       Influenza A by PCR 05/30/2022 NEGATIVE  NEGATIVE Final   Influenza B by PCR 05/30/2022 NEGATIVE  NEGATIVE Final   Comment: (NOTE) The Xpert Xpress SARS-CoV-2/FLU/RSV plus assay is intended as an aid in the diagnosis of influenza from Nasopharyngeal swab specimens and should not be used as a sole basis for treatment. Nasal washings and aspirates are unacceptable for Xpert Xpress SARS-CoV-2/FLU/RSV testing.  Fact Sheet for Patients: EntrepreneurPulse.com.au  Fact Sheet for Healthcare Providers: IncredibleEmployment.be  This test is not yet approved or cleared by the [place] FDA and has been authorized for detection and/or diagnosis of SARS-CoV-2 by FDA under an Emergency Use Authorization (EUA). This  EUA will remain in effect (meaning this test can be used) for the duration of the COVID-19 declaration under Section 564(b)(1) of the Act, 21 U.S.C. section 360bbb-3(b)(1), unless the authorization is terminated or revoked.  Performed at [facility], [place] [address] [place], [place] [address]    WBC 05/30/2022 7.8  4.0 - 10.5 K/uL Final   RBC 05/30/2022 4.90  4.22 - 5.81 MIL/uL Final   Hemoglobin 05/30/2022 13.7  13.0 - 17.0 g/dL Final   HCT 05/30/2022 40.9  39.0 - 52.0 % Final   MCV 05/30/2022 83.5  80.0 - 100.0 fL Final   MCH 05/30/2022 28.0  26.0 - 34.0 pg Final   MCHC 05/30/2022 33.5  30.0 - 36.0 g/dL Final   RDW 05/30/2022 13.7  11.5 - 15.5 % Final   Platelets 05/30/2022 313  150 - 400 K/uL Final   nRBC 05/30/2022 0.0  0.0 - 0.2 % Final   Neutrophils Relative % 05/30/2022 66  % Final   Neutro Abs 05/30/2022 5.2  1.7 - 7.7 K/uL Final   Lymphocytes Relative 05/30/2022 25  % Final   Lymphs Abs 05/30/2022 1.9  0.7 - 4.0 K/uL Final   Monocytes Relative 05/30/2022 6  % Final   Monocytes Absolute 05/30/2022 0.5  0.1 - 1.0 K/uL Final   Eosinophils Relative 05/30/2022 2  % Final   Eosinophils Absolute 05/30/2022 0.2  0.0 - 0.5 K/uL Final   Basophils Relative 05/30/2022 1  % Final   Basophils Absolute 05/30/2022 0.1  0.0 - 0.1 K/uL Final   Immature Granulocytes 05/30/2022 0  % Final   Abs Immature Granulocytes 05/30/2022 0.01  0.00 - 0.07 K/uL Final   Performed at [facility], [place] [address], [place], [place] [address]   Sodium 05/30/2022 139  135 - 145 mmol/L Final   Potassium 05/30/2022 3.6  3.5 - 5.1 mmol/L Final   Chloride 05/30/2022 106  98 - 111 mmol/L Final   CO2 05/30/2022 26  22 - 32 mmol/L Final   Glucose, Bld 05/30/2022 98  70 - 99 mg/dL Final   Glucose reference range applies only to samples taken after fasting for at least 8 hours.   BUN 05/30/2022 13  6 - 20 mg/dL Final   Creatinine, Ser 05/30/2022 1.00  0.61 - 1.24 mg/dL Final   Calcium 05/30/2022 9.4   8.9 - 10.3 mg/dL Final   Total  Protein 05/30/2022 7.0  6.5 - 8.1 g/dL Final   Albumin 05/30/2022 4.1  3.5 - 5.0 g/dL Final   AST 05/30/2022 36  15 - 41 U/L Final   ALT 05/30/2022 25  0 - 44 U/L Final   Alkaline Phosphatase 05/30/2022 46  38 - 126 U/L Final   Total Bilirubin 05/30/2022 0.7  0.3 - 1.2 mg/dL Final   GFR, Estimated 05/30/2022 >60  >60 mL/min Final   Comment: (NOTE) Calculated using the CKD-EPI Creatinine Equation (2021)    Anion gap 05/30/2022 7  5 - 15 Final   Performed at [facility], [place] [address], [place], [place] [address]   Hgb A1c MFr Bld 05/30/2022 5.2  4.8 - 5.6 % Final   Comment: (NOTE) Pre diabetes:          5.7%-6.4%  Diabetes:              >6.4%  Glycemic control for   <7.0% adults with diabetes    Mean Plasma Glucose 05/30/2022 102.54  mg/dL Final   Performed at [facility], [place] [address] [place], [place] [address]   Cholesterol 05/30/2022 188  0 - 200 mg/dL Final   Triglycerides 05/30/2022 48  <150 mg/dL Final   HDL 05/30/2022 88  >40 mg/dL Final   Total CHOL/HDL Ratio 05/30/2022 2.1  RATIO Final   VLDL 05/30/2022 10  0 - 40 mg/dL Final   LDL Cholesterol 05/30/2022 90  0 - 99 mg/dL Final   Comment:        Total Cholesterol/HDL:CHD Risk Coronary Heart Disease Risk Table                     Men   Women  1/2 Average Risk   3.4   3.3  Average Risk       5.0   4.4  2 X Average Risk   9.6   7.1  3 X Average Risk  23.4   11.0        Use the calculated Patient Ratio above and the CHD Risk Table to determine the patient's CHD Risk.        ATP III CLASSIFICATION (LDL):  <100     mg/dL   Optimal  100-129  mg/dL   Near or Above                    Optimal  130-159  mg/dL   Borderline  160-189  mg/dL   High  >190     mg/dL   Very High Performed at [facility] [address] [place], [place] [address]    TSH 05/30/2022 0.753  0.350 - 4.500 uIU/mL Final   Comment: Performed by a 3rd Generation assay with a functional  sensitivity of <=0.01 uIU/mL. Performed at [facility], [place] [address] [place], [place] [address]    POC Amphetamine UR 05/30/2022 Positive (A)  NONE DETECTED (Cut Off Level 1000 ng/mL) Final   POC Secobarbital (BAR) 05/30/2022 None Detected  NONE DETECTED (Cut Off Level 300 ng/mL) Final   POC Buprenorphine (BUP) 05/30/2022 None Detected  NONE DETECTED (Cut Off Level 10 ng/mL) Final   POC Oxazepam (BZO) 05/30/2022 None Detected  NONE DETECTED (Cut Off Level 300 ng/mL) Final   POC Cocaine UR 05/30/2022 Positive (A)  NONE DETECTED (Cut Off Level 300 ng/mL) Final   POC Methamphetamine UR 05/30/2022 Positive (A)  NONE DETECTED (Cut Off Level 1000 ng/mL)  Final   POC Morphine 05/30/2022 None Detected  NONE DETECTED (Cut Off Level 300 ng/mL) Final   POC Methadone UR 05/30/2022 None Detected  NONE DETECTED (Cut Off Level 300 ng/mL) Final   POC Oxycodone UR 05/30/2022 None Detected  NONE DETECTED (Cut Off Level 100 ng/mL) Final   POC Marijuana UR 05/30/2022 Positive (A)  NONE DETECTED (Cut Off Level 50 ng/mL) Final   SARSCOV2ONAVIRUS 2 AG 05/30/2022 NEGATIVE  NEGATIVE Final   Comment: (NOTE) SARS-CoV-2 antigen NOT DETECTED.   Negative results are presumptive.  Negative results do not preclude SARS-CoV-2 infection and should not be used as the sole basis for treatment or other patient management decisions, including infection  control decisions, particularly in the presence of clinical signs and  symptoms consistent with COVID-19, or in those who have been in contact with the virus.  Negative results must be combined with clinical observations, patient history, and epidemiological information. The expected result is Negative.  Fact Sheet for Patients: HandmadeRecipes.com.cy  Fact Sheet for Healthcare Providers: FuneralLife.at  This test is not yet approved or cleared by the [place] FDA and  has been authorized for detection and/or  diagnosis of SARS-CoV-2 by FDA under an Emergency Use Authorization (EUA).  This EUA will remain in effect (meaning this test can be used) for the duration of  the COV                          ID-19 declaration under Section 564(b)(1) of the Act, 21 U.S.C. section 360bbb-3(b)(1), unless the authorization is terminated or revoked sooner.    Admission on 05/21/2022, Discharged on 05/21/2022  Component Date Value Ref Range Status   Glucose-Capillary 05/21/2022 105 (H)  70 - 99 mg/dL Final   Glucose reference range applies only to samples taken after fasting for at least 8 hours.  Admission on 05/20/2022, Discharged on 05/21/2022  Component Date Value Ref Range Status   WBC 05/20/2022 10.8 (H)  4.0 - 10.5 K/uL Final   RBC 05/20/2022 4.79  4.22 - 5.81 MIL/uL Final   Hemoglobin 05/20/2022 13.4  13.0 - 17.0 g/dL Final   HCT 05/20/2022 40.1  39.0 - 52.0 % Final   MCV 05/20/2022 83.7  80.0 - 100.0 fL Final   MCH 05/20/2022 28.0  26.0 - 34.0 pg Final   MCHC 05/20/2022 33.4  30.0 - 36.0 g/dL Final   RDW 05/20/2022 13.6  11.5 - 15.5 % Final   Platelets 05/20/2022 299  150 - 400 K/uL Final   nRBC 05/20/2022 0.0  0.0 - 0.2 % Final   Neutrophils Relative % 05/20/2022 71  % Final   Neutro Abs 05/20/2022 7.7  1.7 - 7.7 K/uL Final   Lymphocytes Relative 05/20/2022 19  % Final   Lymphs Abs 05/20/2022 2.1  0.7 - 4.0 K/uL Final   Monocytes Relative 05/20/2022 8  % Final   Monocytes Absolute 05/20/2022 0.8  0.1 - 1.0 K/uL Final   Eosinophils Relative 05/20/2022 1  % Final   Eosinophils Absolute 05/20/2022 0.1  0.0 - 0.5 K/uL Final   Basophils Relative 05/20/2022 1  % Final   Basophils Absolute 05/20/2022 0.1  0.0 - 0.1 K/uL Final   Immature Granulocytes 05/20/2022 0  % Final   Abs Immature Granulocytes 05/20/2022 0.03  0.00 - 0.07 K/uL Final   Performed at [facility], [place] [address] [place], [place] [address]   Sodium 05/20/2022 134 (L)  135 - 145 mmol/L  Final   Potassium 05/20/2022 3.6   3.5 - 5.1 mmol/L Final   Chloride 05/20/2022 104  98 - 111 mmol/L Final   CO2 05/20/2022 21 (L)  22 - 32 mmol/L Final   Glucose, Bld 05/20/2022 213 (H)  70 - 99 mg/dL Final   Glucose reference range applies only to samples taken after fasting for at least 8 hours.   BUN 05/20/2022 19  6 - 20 mg/dL Final   Creatinine, Ser 05/20/2022 1.18  0.61 - 1.24 mg/dL Final   Calcium 05/20/2022 9.0  8.9 - 10.3 mg/dL Final   Total Protein 05/20/2022 6.8  6.5 - 8.1 g/dL Final   Albumin 05/20/2022 4.1  3.5 - 5.0 g/dL Final   AST 05/20/2022 40  15 - 41 U/L Final   ALT 05/20/2022 25  0 - 44 U/L Final   Alkaline Phosphatase 05/20/2022 45  38 - 126 U/L Final   Total Bilirubin 05/20/2022 0.7  0.3 - 1.2 mg/dL Final   GFR, Estimated 05/20/2022 >60  >60 mL/min Final   Comment: (NOTE) Calculated using the CKD-EPI Creatinine Equation (2021)    Anion gap 05/20/2022 9  5 - 15 Final   Performed at [facility] [address] [place], [place] [address]   Alcohol, Ethyl (B) 05/20/2022 <10  <10 mg/dL Final   Comment: (NOTE) Lowest detectable limit for serum alcohol is 10 mg/dL.  For medical purposes only. Performed at [facility], [place] [address] [place], [place] [address]    Salicylate Lvl 48/18/5631 <7.0 (L)  7.0 - 30.0 mg/dL Final   Performed at [facility] [address], [place], [place] [address]   Acetaminophen (Tylenol), Serum 05/20/2022 <10 (L)  10 - 30 ug/mL Final   Comment: (NOTE) Therapeutic concentrations vary significantly. A range of 10-30 ug/mL  may be an effective concentration for many patients. However, some  are best treated at concentrations outside of this range. Acetaminophen concentrations >150 ug/mL at 4 hours after ingestion  and >50 ug/mL at 12 hours after ingestion are often associated with  toxic reactions.  Performed at [facility], [place] [address], [place], [place] [address]   Admission on 05/14/2022, Discharged on 05/14/2022  Component Date  Value Ref Range Status   Sodium 05/14/2022 139  135 - 145 mmol/L Final   Potassium 05/14/2022 3.8  3.5 - 5.1 mmol/L Final   Chloride 05/14/2022 107  98 - 111 mmol/L Final   CO2 05/14/2022 23  22 - 32 mmol/L Final   Glucose, Bld 05/14/2022 76  70 - 99 mg/dL Final   Glucose reference range applies only to samples taken after fasting for at least 8 hours.   BUN 05/14/2022 27 (H)  6 - 20 mg/dL Final   Creatinine, Ser 05/14/2022 0.99  0.61 - 1.24 mg/dL Final   Calcium 05/14/2022 9.0  8.9 - 10.3 mg/dL Final   GFR, Estimated 05/14/2022 >60  >60 mL/min Final   Comment: (NOTE) Calculated using the CKD-EPI Creatinine Equation (2021)    Anion gap 05/14/2022 9  5 - 15 Final   Performed at [facility], [place] [address], [place], [place] [address]   WBC 05/14/2022 7.7  4.0 - 10.5 K/uL Final   RBC 05/14/2022 4.50  4.22 - 5.81 MIL/uL Final   Hemoglobin 05/14/2022 13.0  13.0 - 17.0 g/dL Final   HCT 05/14/2022 38.0 (L)  39.0 - 52.0 % Final   MCV 05/14/2022 84.4  80.0 -  100.0 fL Final   MCH 05/14/2022 28.9  26.0 - 34.0 pg Final   MCHC 05/14/2022 34.2  30.0 - 36.0 g/dL Final   RDW 05/14/2022 13.8  11.5 - 15.5 % Final   Platelets 05/14/2022 285  150 - 400 K/uL Final   nRBC 05/14/2022 0.0  0.0 - 0.2 % Final   Performed at [facility], [place] [address] [place], [place] [address]   Color, Urine 05/14/2022 YELLOW  YELLOW Final   APPearance 05/14/2022 HAZY (A)  CLEAR Final   Specific Gravity, Urine 05/14/2022 1.029  1.005 - 1.030 Final   pH 05/14/2022 6.0  5.0 - 8.0 Final   Glucose, UA 05/14/2022 NEGATIVE  NEGATIVE mg/dL Final   Hgb urine dipstick 05/14/2022 NEGATIVE  NEGATIVE Final   Bilirubin Urine 05/14/2022 NEGATIVE  NEGATIVE Final   Ketones, ur 05/14/2022 NEGATIVE  NEGATIVE mg/dL Final   Protein, ur 05/14/2022 NEGATIVE  NEGATIVE mg/dL Final   Nitrite 05/14/2022 NEGATIVE  NEGATIVE Final   Leukocytes,Ua 05/14/2022 MODERATE (A)  NEGATIVE Final   RBC / HPF 05/14/2022  6-10  0 - 5 RBC/hpf Final   WBC, UA 05/14/2022 >50 (H)  0 - 5 WBC/hpf Final   Bacteria, UA 05/14/2022 NONE SEEN  NONE SEEN Final   Squamous Epithelial / LPF 05/14/2022 0-5  0 - 5 Final   Mucus 05/14/2022 PRESENT   Final   Performed at [facility], [place] [address] [place], [place] [address]   Glucose-Capillary 05/14/2022 107 (H)  70 - 99 mg/dL Final   Glucose reference range applies only to samples taken after fasting for at least 8 hours.   Troponin I (High Sensitivity) 05/14/2022 4  <18 ng/L Final   Comment: (NOTE) Elevated high sensitivity troponin I (hsTnI) values and significant  changes across serial measurements may suggest ACS but many other  chronic and acute conditions are known to elevate hsTnI results.  Refer to the "Links" section for chest pain algorithms and additional  guidance. Performed at [facility], [place] [address], [place], [place] [address]    Acetaminophen (Tylenol), Serum 05/14/2022 <10 (L)  10 - 30 ug/mL Final   Performed at [facility], [place] [address], [place], [place] [address]   Salicylate Lvl 61/95/0932 <7.0 (L)  7.0 - 30.0 mg/dL Final   Performed at [place] [address] [place], [place] [address]   Opiates 05/14/2022 NONE DETECTED  NONE DETECTED Final   Cocaine 05/14/2022 POSITIVE (A)  NONE DETECTED Final   Benzodiazepines 05/14/2022 NONE DETECTED  NONE DETECTED Final   Amphetamines 05/14/2022 NONE DETECTED  NONE DETECTED Final   Tetrahydrocannabinol 05/14/2022 POSITIVE (A)  NONE DETECTED Final   Barbiturates 05/14/2022 NONE DETECTED  NONE DETECTED Final   Comment: (NOTE) DRUG SCREEN FOR MEDICAL PURPOSES ONLY.  IF CONFIRMATION IS NEEDED FOR ANY PURPOSE, NOTIFY LAB WITHIN 5 DAYS.  LOWEST DETECTABLE LIMITS FOR URINE DRUG SCREEN Drug Class                     Cutoff (ng/mL) Amphetamine and metabolites    1000 Barbiturate and metabolites    200 Benzodiazepine                  580 Tricyclics and metabolites     300 Opiates and metabolites        300 Cocaine and metabolites        300 THC  50 Performed at [facility], [place] [address] [place], [place] [address]   Admission on 05/04/2022, Discharged on 05/04/2022  Component Date Value Ref Range Status   Sodium 05/04/2022 142  135 - 145 mmol/L Final   Potassium 05/04/2022 3.9  3.5 - 5.1 mmol/L Final   Chloride 05/04/2022 109  98 - 111 mmol/L Final   CO2 05/04/2022 27  22 - 32 mmol/L Final   Glucose, Bld 05/04/2022 66 (L)  70 - 99 mg/dL Final   Glucose reference range applies only to samples taken after fasting for at least 8 hours.   BUN 05/04/2022 17  6 - 20 mg/dL Final   Creatinine, Ser 05/04/2022 1.22  0.61 - 1.24 mg/dL Final   Calcium 05/04/2022 9.2  8.9 - 10.3 mg/dL Final   GFR, Estimated 05/04/2022 >60  >60 mL/min Final   Comment: (NOTE) Calculated using the CKD-EPI Creatinine Equation (2021)    Anion gap 05/04/2022 6  5 - 15 Final   Performed at [facility], [place] [address] [place], [place] [address]   WBC 05/04/2022 10.0  4.0 - 10.5 K/uL Final   RBC 05/04/2022 4.75  4.22 - 5.81 MIL/uL Final   Hemoglobin 05/04/2022 13.5  13.0 - 17.0 g/dL Final   HCT 05/04/2022 40.6  39.0 - 52.0 % Final   MCV 05/04/2022 85.5  80.0 - 100.0 fL Final   MCH 05/04/2022 28.4  26.0 - 34.0 pg Final   MCHC 05/04/2022 33.3  30.0 - 36.0 g/dL Final   RDW 05/04/2022 14.0  11.5 - 15.5 % Final   Platelets 05/04/2022 325  150 - 400 K/uL Final   nRBC 05/04/2022 0.0  0.0 - 0.2 % Final   Performed at [place] [address], [place], [place] [address]   Troponin I (High Sensitivity) 05/04/2022 4  <18 ng/L Final   Comment: (NOTE) Elevated high sensitivity troponin I (hsTnI) values and significant  changes across serial measurements may suggest ACS but many other  chronic and acute conditions are known to elevate hsTnI results.  Refer to the "Links" section for  chest pain algorithms and additional  guidance. Performed at [facility], [place] [address], [place], [place] [address]    Troponin I (High Sensitivity) 05/04/2022 4  <18 ng/L Final   Comment: (NOTE) Elevated high sensitivity troponin I (hsTnI) values and significant  changes across serial measurements may suggest ACS but many other  chronic and acute conditions are known to elevate hsTnI results.  Refer to the "Links" section for chest pain algorithms and additional  guidance. Performed at [facility], [place] [address], [place], [place] [address]    Glucose-Capillary 05/04/2022 143 (H)  70 - 99 mg/dL Final   Glucose reference range applies only to samples taken after fasting for at least 8 hours.  Admission on 05/01/2022, Discharged on 05/01/2022  Component Date Value Ref Range Status   WBC 05/01/2022 8.8  4.0 - 10.5 K/uL Final   RBC 05/01/2022 4.68  4.22 - 5.81 MIL/uL Final   Hemoglobin 05/01/2022 13.3  13.0 - 17.0 g/dL Final   HCT 05/01/2022 39.1  39.0 - 52.0 % Final   MCV 05/01/2022 83.5  80.0 - 100.0 fL Final   MCH 05/01/2022 28.4  26.0 - 34.0 pg Final   MCHC 05/01/2022 34.0  30.0 - 36.0 g/dL Final   RDW 05/01/2022 14.1  11.5 - 15.5 % Final   Platelets 05/01/2022 302  150 -  400 K/uL Final   nRBC 05/01/2022 0.0  0.0 - 0.2 % Final   Performed at [facility], [place] [address] [place], [place] [address]   Sodium 05/01/2022 139  135 - 145 mmol/L Final   Potassium 05/01/2022 3.3 (L)  3.5 - 5.1 mmol/L Final   Chloride 05/01/2022 107  98 - 111 mmol/L Final   CO2 05/01/2022 22  22 - 32 mmol/L Final   Glucose, Bld 05/01/2022 82  70 - 99 mg/dL Final   Glucose reference range applies only to samples taken after fasting for at least 8 hours.   BUN 05/01/2022 12  6 - 20 mg/dL Final   Creatinine, Ser 05/01/2022 0.85  0.61 - 1.24 mg/dL Final   Calcium 05/01/2022 8.8 (L)  8.9 - 10.3 mg/dL Final   GFR, Estimated 05/01/2022 >60  >60 mL/min Final   Comment: (NOTE) Calculated  using the CKD-EPI Creatinine Equation (2021)    Anion gap 05/01/2022 10  5 - 15 Final   Performed at [facility], [place] [address] [place], [place] [address]   Salicylate Lvl 94/85/4627 <7.0 (L)  7.0 - 30.0 mg/dL Final   Performed at [place] [address] [place], [place] [address]   Alcohol, Ethyl (B) 05/01/2022 47 (H)  <10 mg/dL Final   Comment: (NOTE) Lowest detectable limit for serum alcohol is 10 mg/dL.  For medical purposes only. Performed at [facility], [place] [address], [place], [place] [address]    SARS Coronavirus 2 by RT PCR 05/01/2022 NEGATIVE  NEGATIVE Final   Comment: (NOTE) SARS-CoV-2 target nucleic acids are NOT DETECTED.  The SARS-CoV-2 RNA is generally detectable in upper respiratory specimens during the acute phase of infection. The lowest concentration of SARS-CoV-2 viral copies this assay can detect is 138 copies/mL. A negative result does not preclude SARS-Cov-2 infection and should not be used as the sole basis for treatment or other patient management decisions. A negative result may occur with  improper specimen collection/handling, submission of specimen other than nasopharyngeal swab, presence of viral mutation(s) within the areas targeted by this assay, and inadequate number of viral copies(<138 copies/mL). A negative result must be combined with clinical observations, patient history, and epidemiological information. The expected result is Negative.  Fact Sheet for Patients:  EntrepreneurPulse.com.au  Fact Sheet for Healthcare Providers:  IncredibleEmployment.be  This test is no                          t yet approved or cleared by the [place] FDA and  has been authorized for detection and/or diagnosis of SARS-CoV-2 by FDA under an Emergency Use Authorization (EUA). This EUA will remain  in effect (meaning this test can be used) for the duration of the COVID-19 declaration under  Section 564(b)(1) of the Act, 21 U.S.C.section 360bbb-3(b)(1), unless the authorization is terminated  or revoked sooner.       Influenza A by PCR 05/01/2022 NEGATIVE  NEGATIVE Final   Influenza B by PCR 05/01/2022 NEGATIVE  NEGATIVE Final   Comment: (NOTE) The Xpert Xpress SARS-CoV-2/FLU/RSV plus assay is intended as an aid in the diagnosis of influenza from Nasopharyngeal swab specimens and should not be used as a sole basis for treatment. Nasal washings and aspirates are unacceptable for Xpert Xpress SARS-CoV-2/FLU/RSV testing.  Fact Sheet for Patients: EntrepreneurPulse.com.au  Fact Sheet for Healthcare Providers: IncredibleEmployment.be  This test is not yet approved or cleared by  the [employer] and has been authorized for detection and/or diagnosis of SARS-CoV-2 by FDA under an Emergency Use Authorization (EUA). This EUA will remain in effect (meaning this test can be used) for the duration of the COVID-19 declaration under Section 564(b)(1) of the Act, 21 U.S.C. section 360bbb-3(b)(1), unless the authorization is terminated or revoked.  Performed at [facility], [place] [address] [place], [place] [address]   There may be more visits with results that are not included.    Allergies: Patient has no known allergies.  PTA Medications: (Not in a hospital admission)   Medical Decision Making  Patient says that he is unable to contract for safety at this time.  Patient will be admitted to [facility] for continuous assessment with follow-up by psychiatry on 06/29/22. -Review available lab results -Patient says he would like to get his LAI prior to discharge; no scheduled dose is 07/03/2022    Recommendations  Based on my evaluation the patient does not appear to have an emergency medical condition.   Ophelia Shoulder, NP 06/29/22  6:10 AM

## 2022-06-29 NOTE — ED Notes (Signed)
Pt given to safe transport.

## 2022-06-29 NOTE — ED Notes (Signed)
Attempted to call [facility] twice. Voicemail has been left. RN will attempt again.

## 2022-06-29 NOTE — ED Notes (Signed)
Safe transport called. Transportation arranged for pt.

## 2022-06-29 NOTE — ED Notes (Signed)
Patient admitted to [facility] endorsing SI. Patient was cooperative during the admission assessment. Skin assessment complete. Belongings inventoried. Patient oriented to unit and unit rules. Patient showered. Meal and drinks offered to patient. Patient verbalized agreement to treatment plans. Patient verbally contracts for safety during hospitalization. Will continue to monitor for safety.

## 2022-06-29 NOTE — ED Notes (Signed)
Pt was given cereal

## 2022-06-29 NOTE — Progress Notes (Signed)
Order received for patient discharge. AVS printed and reviewed with patient. No signs of acute decompensation. Pt denies any acute concerns, no SI or HI or signs of responding to internal stimuli. When patient informed of discharge requested hot meal prior to discharge. Chicken Parmesan provided for patient. Pt belongings returned and escorted from unit to lobby. Pt provided bus pass.

## 2022-06-29 NOTE — ED Notes (Signed)
TTS at bedside. 

## 2022-06-29 NOTE — BH Assessment (Signed)
Comprehensive Clinical Assessment (CCA) Note  06/29/2022 Tyler White [id number]  Disposition: Quintella Reichert, NP recommends pt to be admitted to [facility] for Continuous Assessment. Disposition discussed with Criss Alvine, RN.   [facility] ED from 06/28/2022 in [place] DEPT ED from 06/25/2022 in [place] ED from 06/19/2022 in [place] High Risk High Risk High Risk      The patient demonstrates the following risk factors for suicide: Chronic risk factors for suicide include: psychiatric disorder of Schizoaffective Disorder, Bipolar Type, HCC and substance use disorder. Acute risk factors for suicide include:  Pt reports, he attempted suicide by overdosing on Zoloft, Crack Cocaine and Marijuana . Protective factors for this patient include:  None . Considering these factors, the overall suicide risk at this point appears to be . Patient is appropriate for outpatient follow up.  Tyler White is a 26 year old male who presents voluntary and unaccompanied to [facility] Clinician asked the pt, "what brought you to the hospital?" Pt reports, he's been living recklessly, he fell asleep in front from [employer] on [address], he got up around 0500 came to "mental health but didn't get help. Pt reports, he then took four Zoloft tablets, smoked Crack Cocaine and Marijuana as a suicide attempt. Pt reports, he's been homeless for 2.5 years due to his drug use and coming home late. Pt reports, he's still suicidal. Pt denies, HI, AVH, self-injurious behaviors and access to weapons.   Pt reports, using "not that much," Crack, smoking two blunts.  Pt reports, he was prescribed Zoloft and is not taking medications. Pt does not know the dosage. Pt denies, linked to OPT resources (medication management and/or counseling.) Pt denies, previous inpatient admissions.  Pt presents quiet,  awake in scrubs with flat affect. Pt's mood was depressed. Pt's insight and judgement was poor. Pt reports, if discharged he can't contract for safety. Clinician discussed the three possible dispositions (discharged with OPT resources, observe/reassess by psychiatry or inpatient treatment) in detail.   Diagnosis: Schizoaffective Disorder, Bipolar Type, HCC.   *Pt reports, he has no supports to be contacted.*   Chief Complaint:  Chief Complaint  Patient presents with   Ingestion   Visit Diagnosis:     CCA Screening, Triage and Referral (STR)  Patient Reported Information How did you hear about [place]? Family/Friend  What Is the Reason for Your Visit/Call Today? Per EDP note: "26 year old male presents with an overdose and suicide attempt. Patient states he took about 3 Zoloft tablets at around 5 AM.  He states this was an attempt to kill himself. He still feels suicidal. Patient is sleepy on exam though able to wake up and talk to me  However he is not participating very much in the history or exam."  How Long Has This Been Causing You Problems? > than 6 months  What Do You Feel Would Help You the Most Today? Alcohol or Drug Use Treatment; Treatment for Depression or other mood problem; Housing Assistance; Medication(s)   Have You Recently Had Any Thoughts About Hurting Yourself? Yes  Are You Planning to Commit Suicide/Harm Yourself At This time? Yes   Have you Recently Had Thoughts About Hurting Someone [name]? No  Are You Planning to Harm Someone at This Time? No  Explanation: No data recorded  Have You Used Any Alcohol or Drugs in the Past 24 Hours? Yes  How Long Ago Did You Use Drugs or  Alcohol? No data recorded What Did You Use and How Much? Pt reports, using "not that much," Crack Cocaine. Pt reports, smoking two blunts.   Do You Currently Have a Therapist/Psychiatrist? No  Name of Therapist/Psychiatrist: No data recorded  Have You Been Recently Discharged From Any  Office Practice or Programs? Yes  Explanation of Discharge From Practice/Program: Pt has had multiple visits to the [facility]     CCA Screening Triage Referral Assessment Type of Contact: Tele-Assessment  Telemedicine Service Delivery: Telemedicine service delivery: This service was provided via telemedicine using a 2-way, interactive audio and video technology  Is this Initial or Reassessment? Initial Assessment  Date Telepsych consult ordered in [facility]:  06/28/22  Time Telepsych consult ordered in [facility]:  1305  Location of Assessment: [facility]  Provider Location: [facility] Assessment Services   Collateral Involvement: Pt reports, he has no supports to be contacted.   Does Patient Have a Stage manager Guardian? No data recorded Name and Contact of Legal Guardian: No data recorded If Minor and Not Living with Parent(s), Who has Custody? N/A  Is CPS involved or ever been involved? Never  Is APS involved or ever been involved? Never   Patient Determined To Be At Risk for Harm To Self or Others Based on Review of Patient Reported Information or Presenting Complaint? Yes, for Self-Harm  Method: No data recorded Availability of Means: No data recorded Intent: No data recorded Notification Required: No data recorded Additional Information for Danger to Others Potential: No data recorded Additional Comments for Danger to Others Potential: No data recorded Are There Guns or Other Weapons in Your Home? No data recorded Types of Guns/Weapons: No data recorded Are These Weapons Safely Secured?                            No data recorded Who Could Verify You Are Able To Have These Secured: No data recorded Do You Have any Outstanding Charges, Pending Court Dates, Parole/Probation? No data recorded Contacted To Inform of Risk of Harm To Self or Others: Event organiser; Family/Significant Other: (LEO and pt's family are aware)    Does Patient Present under Involuntary Commitment?  No  IVC Papers Initial File Date: No data recorded  [place] of Residence: [place]   Patient Currently Receiving the Following Services: Not Receiving Services   Determination of Need: Urgent (48 hours)   Options For Referral: Medication Management; Inpatient Hospitalization; Facility-Based Crisis; [facility]     CCA Biopsychosocial Patient Reported Schizophrenia/Schizoaffective Diagnosis in Past: Yes   Strengths: Pt is seeking assistance for his mental health concerns. He is able to answer the questions posed.   Mental Health Symptoms Depression:   Fatigue; Hopelessness; Sleep (too much or little); Irritability; Worthlessness; Change in energy/activity (Despondent, isolation, guilt/blame.)   Duration of Depressive symptoms:    Mania:   Change in energy/activity; Irritability; Racing thoughts   Anxiety:    Tension; Sleep; Fatigue   Psychosis:   None   Duration of Psychotic symptoms:  Duration of Psychotic Symptoms: Greater than six months   Trauma:   None   Obsessions:   None   Compulsions:   None   Inattention:   Disorganized; Forgetful; Loses things   Hyperactivity/Impulsivity:   Feeling of restlessness; Fidgets with hands/feet   Oppositional/Defiant Behaviors:   Angry   Emotional Irregularity:   Recurrent suicidal behaviors/gestures/threats; Potentially harmful impulsivity   Other Mood/Personality Symptoms:   None noted  Mental Status Exam Appearance and self-care  Stature:   Average   Weight:   Average weight   Clothing:   -- [facility] scrubs)   Grooming:   Neglected   Cosmetic use:   None   Posture/gait:   Normal   Motor activity:   Not Remarkable   Sensorium  Attention:   Normal   Concentration:   Normal   Orientation:   X5   Recall/memory:   Normal   Affect and Mood  Affect:   Flat   Mood:   Depressed   Relating  Eye contact:   Normal   Facial expression:   Depressed   Attitude toward  examiner:   Cooperative   Thought and Language  Speech flow:  Normal   Thought content:   Appropriate to Mood and Circumstances   Preoccupation:   None   Hallucinations:   None   Organization:  No data recorded  [employer] of Knowledge:   Poor   Intelligence:   Average   Abstraction:   Functional   Judgement:   Poor   Reality Testing:   Adequate   Insight:   Poor   Decision Making:   Impulsive   Social Functioning  Social Maturity:   Irresponsible; Impulsive   Social Judgement:   Victimized; Naive   Stress  Stressors:   Housing (Drug use.)   Coping Ability:   Deficient supports; Overwhelmed   Skill Deficits:   Communication; Decision making; Interpersonal; Self-control   Supports:   Support needed     Religion: Religion/Spirituality Are You A Religious Person?: No  Leisure/Recreation: Leisure / Recreation Do You Have Hobbies?: No  Exercise/Diet: Exercise/Diet Do You Exercise?: No Have You Gained or Lost A Significant Amount of Weight in the Past Six Months?: No Do You Follow a Special Diet?: No Do You Have Any Trouble Sleeping?: Yes Explanation of Sleeping Difficulties: Pt reports, he has no where to sleep (homelessness).   CCA Employment/Education Employment/Work Situation: Employment / Work Technical sales engineer: Unemployed Has Patient ever Been in Passenger transport manager?: No  Education: Education Is Patient Currently Attending School?: No Last Grade Completed: 42 Did You Nutritional therapist?: No   CCA Family/Childhood History Family and Relationship History: Family history Marital status: Single Does patient have children?: No How is patient's relationship with their children?: N/A  Childhood History:  Childhood History By whom was/is the patient raised?: Mother, Father Did patient suffer any verbal/emotional/physical/sexual abuse as a child?: No (Pt denies.) Did patient suffer from severe childhood  neglect?: No Has patient ever been sexually abused/assaulted/raped as an adolescent or adult?: No (Pt denies.) Was the patient ever a victim of a crime or a disaster?: No (Pt denies.) Spoken with a professional about abuse?: No Does patient feel these issues are resolved?: No Witnessed domestic violence?: No Has patient been affected by domestic violence as an adult?: No  Child/Adolescent Assessment:     CCA Substance Use Alcohol/Drug Use: Alcohol / Drug Use Pain Medications: See MAR Prescriptions: See MAR Over the Counter: See MAR History of alcohol / drug use?: Yes Substance #1 Name of Substance 1: Crack Cocaine. 1 - Age of First Use: Pt started using 2.5 years ago. 1 - Amount (size/oz): Pt reports, using "not that much," today as part of an overdose. 1 - Frequency: Everyday. 1 - Duration: Ongoing. 1 - Last Use / Amount: 06/28/2022. 1 - Method of Aquiring: Purchase. 1- Route of Use: Inhalation. Substance #2 Name of Substance 2: Marijuana.  2 - Age of First Use: Pt started using 2.5 years ago. 2 - Amount (size/oz): Pt reports, smoking two blunts. 2 - Frequency: Per pt, "not everyday but every other weekend." 2 - Duration: Ongoing. 2 - Last Use / Amount: 06/28/2022. 2 - Method of Aquiring: Purchase. 2 - Route of Substance Use: Inhalation.    ASAM's:  Six Dimensions of Multidimensional Assessment  Dimension 1:  Acute Intoxication and/or Withdrawal Potential:      Dimension 2:  Biomedical Conditions and Complications:      Dimension 3:  Emotional, Behavioral, or Cognitive Conditions and Complications:     Dimension 4:  Readiness to Change:     Dimension 5:  Relapse, Continued use, or Continued Problem Potential:     Dimension 6:  Recovery/Living Environment:     ASAM Severity Score:    ASAM Recommended Level of Treatment:     Substance use Disorder (SUD)    Recommendations for Services/Supports/Treatments: Recommendations for  Services/Supports/Treatments Recommendations For Services/Supports/Treatments: Other (Comment) (Pt to be admitted to [facility] for Continuous Assessment.)  Discharge Disposition:    DSM5 Diagnoses: Patient Active Problem List   Diagnosis Date Noted   Suicidal thoughts    Malingering 04/05/2022   Noncompliance with medications 04/05/2022   Suicidal ideation 04/05/2022   Stimulant use disorder 02/07/2022   Substance induced mood disorder ([place]) 12/12/2021   Xerosis of skin 10/09/2021   Schizoaffective disorder, bipolar type ([place]) 04/26/2021   Marijuana abuse in remission 04/26/2021   Polysubstance abuse ([place]) 04/23/2021   Homelessness 04/23/2021   Tobacco abuse 01/15/2021   Schizophrenia ([place]) 11/19/2020   Cannabis abuse 04/28/2020   Cocaine abuse ([place]) 04/28/2020   Cocaine abuse, continuous use ([place]) 02/19/2018   Cocaine abuse with cocaine-induced mood disorder ([place]) 02/19/2018   Psychosis ([place]) 11/27/2017   Cannabis abuse with psychotic disorder, with delusions ([place]) 11/26/2017   Schizophrenia, unspecified ([place]) 11/26/2017   Right inguinal hernia 04/06/2013     Referrals to Alternative Service(s): Referred to Alternative Service(s):   Place:   Date:   Time:    Referred to Alternative Service(s):   Place:   Date:   Time:    Referred to Alternative Service(s):   Place:   Date:   Time:    Referred to Alternative Service(s):   Place:   Date:   Time:     Vertell Novak, [facility] Comprehensive Clinical Assessment (CCA) Screening, Triage and Referral Note  06/29/2022 Tyler White [id number]  Chief Complaint:  Chief Complaint  Patient presents with   Ingestion   Visit Diagnosis:   Patient Reported Information How did you hear about [place]? Family/Friend  What Is the Reason for Your Visit/Call Today? Per EDP note: "26 year old male presents with an overdose and suicide attempt. Patient states he took about 3 Zoloft tablets at around 5 AM.  He states this was an attempt to  kill himself. He still feels suicidal. Patient is sleepy on exam though able to wake up and talk to me  However he is not participating very much in the history or exam."  How Long Has This Been Causing You Problems? > than 6 months  What Do You Feel Would Help You the Most Today? Alcohol or Drug Use Treatment; Treatment for Depression or other mood problem; Housing Assistance; Medication(s)   Have You Recently Had Any Thoughts About Hurting Yourself? Yes  Are You Planning to Commit Suicide/Harm Yourself At This time? Yes   Have you Recently Had Thoughts About Hurting Someone [name]?  No  Are You Planning to Harm Someone at This Time? No  Explanation: No data recorded  Have You Used Any Alcohol or Drugs in the Past 24 Hours? Yes  How Long Ago Did You Use Drugs or Alcohol? No data recorded What Did You Use and How Much? Pt reports, using "not that much," Crack Cocaine. Pt reports, smoking two blunts.   Do You Currently Have a Therapist/Psychiatrist? No  Name of Therapist/Psychiatrist: No data recorded  Have You Been Recently Discharged From Any Office Practice or Programs? Yes  Explanation of Discharge From Practice/Program: Pt has had multiple visits to the [facility]    CCA Screening Triage Referral Assessment Type of Contact: Tele-Assessment  Telemedicine Service Delivery: Telemedicine service delivery: This service was provided via telemedicine using a 2-way, interactive audio and video technology  Is this Initial or Reassessment? Initial Assessment  Date Telepsych consult ordered in [facility]:  06/28/22  Time Telepsych consult ordered in [facility]:  1305  Location of Assessment: [facility]  Provider Location: [facility] Assessment Services   Collateral Involvement: Pt reports, he has no supports to be contacted.   Does Patient Have a Stage manager Guardian? No data recorded Name and Contact of Legal Guardian: No data recorded If Minor and Not Living with Parent(s), Who has  Custody? N/A  Is CPS involved or ever been involved? Never  Is APS involved or ever been involved? Never   Patient Determined To Be At Risk for Harm To Self or Others Based on Review of Patient Reported Information or Presenting Complaint? Yes, for Self-Harm  Method: No data recorded Availability of Means: No data recorded Intent: No data recorded Notification Required: No data recorded Additional Information for Danger to Others Potential: No data recorded Additional Comments for Danger to Others Potential: No data recorded Are There Guns or Other Weapons in Your Home? No data recorded Types of Guns/Weapons: No data recorded Are These Weapons Safely Secured?                            No data recorded Who Could Verify You Are Able To Have These Secured: No data recorded Do You Have any Outstanding Charges, Pending Court Dates, Parole/Probation? No data recorded Contacted To Inform of Risk of Harm To Self or Others: Event organiser; Family/Significant Other: (LEO and pt's family are aware)   Does Patient Present under Involuntary Commitment? No  IVC Papers Initial File Date: No data recorded  [place] of Residence: [place]   Patient Currently Receiving the Following Services: Not Receiving Services   Determination of Need: Urgent (48 hours)   Options For Referral: Medication Management; Inpatient Hospitalization; Facility-Based Crisis; [facility]gent Care   Discharge Disposition:     Vertell Novak, [place], [place], [facility], [facility] Triage Specialist [phone]

## 2022-06-29 NOTE — BH Assessment (Signed)
Clinician messaged Tyler Fruit, RN and Tyler White, Tijen, RN: "Hey. It's Tyler White with TTS. Is the pt able to engage in the assessment, if so the pt will need to be placed in a private room. Is the pt under IVC? Also is the pt medically cleared?"  Clinician awaiting response.    Tyler White, [place], [facility], [facility] Triage Specialist [phone]

## 2022-06-29 NOTE — ED Notes (Signed)
Report called and given to [facility].

## 2022-06-29 NOTE — ED Provider Notes (Signed)
FBC/OBS ASAP Discharge Summary  Date and Time: 06/29/2022 9:49 AM  Name: Tyler White  MRN:  [id number]   Discharge Diagnoses:  Final diagnoses:  Schizoaffective disorder, bipolar type ([place])    Subjective: Tyler White observed resting in bed. He was transferred from the local emergency department  after a reported overdose on medications. Patient has a follow up appointment with the shot clinic for 07/05/2022. He declined homeless shelter resources.  During evaluation Tyler White is sitting in no acute distress.  He is alert/oriented x 4; calm/cooperative; and mood congruent with affect. He is speaking in a clear tone at moderate volume, and normal pace; with good eye contact.  His thought process is coherent and relevant; There is no indication that he is is currently responding to internal/external stimuli or experiencing delusional thought content; and he has denied suicidal/self-harm/homicidal ideation, psychosis, and paranoia.Patient has remained calm throughout assessment and has answered questions appropriately.     At this time Tyler White is educated and verbalizes understanding of mental health resources and other crisis services in the community. He is instructed to call 911 and present to the nearest emergency room should he experience any suicidal/homicidal ideation, auditory/visual/hallucinations, or detrimental worsening of his mental health condition. He was a also advised by Probation officer that he could call the toll-free phone on insurance card to assist with identifying in network counselors and agencies or number on back of Medicaid card to speak with care coordinator.    Stay Summary:   Total Time spent with patient: 15 minutes  Past Psychiatric History:  Past Medical History:  Past Medical History:  Diagnosis Date   Hernia, inguinal, right    Inguinal hernia    right   Psychiatric illness    Schizophrenia ([place])    No past surgical history on file. Family History:   Family History  Problem Relation Age of Onset   Psychiatric Illness Mother    Hypertension Sister    Family Psychiatric History:  Social History:  Social History   Substance and Sexual Activity  Alcohol Use Not Currently     Social History   Substance and Sexual Activity  Drug Use Not Currently   Types: Marijuana, Cocaine   Comment: crack/cocaine occasionally    Social History   Socioeconomic History   Marital status: Single    Spouse name: Not on file   Number of children: Not on file   Years of education: 10   Highest education level: Not on file  Occupational History   Not on file  Tobacco Use   Smoking status: Some Days    Packs/day: 0.50    Years: 5.00    Total pack years: 2.50    Types: Cigarettes   Smokeless tobacco: Never  Vaping Use   Vaping Use: Never used  Substance and Sexual Activity   Alcohol use: Not Currently   Drug use: Not Currently    Types: Marijuana, Cocaine    Comment: crack/cocaine occasionally   Sexual activity: Yes    Birth control/protection: None  Other Topics Concern   Not on file  Social History Narrative   ** Merged History Encounter **       ** Merged History Encounter **       Social Determinants of Health   Financial Resource Strain: Not on file  Food Insecurity: Not on file  Transportation Needs: Not on file  Physical Activity: Not on file  Stress: Not on file  Social Connections: Not on file   SDOH:  SDOH Screenings   Alcohol Screen: Low Risk  (01/29/2022)   Alcohol Screen    Last Alcohol Screening Score (AUDIT): 0  Depression (PHQ2-9): Medium Risk (06/19/2022)   Depression (PHQ2-9)    PHQ-2 Score: 18  Financial Resource Strain: Not on file  Food Insecurity: Not on file  Housing: Not on file  Physical Activity: Not on file  Social Connections: Not on file  Stress: Not on file  Tobacco Use: High Risk (06/28/2022)   Patient History    Smoking Tobacco Use: Some Days    Smokeless Tobacco Use: Never     Passive Exposure: Not on file  Transportation Needs: Not on file    Tobacco Cessation:  N/A, patient does not currently use tobacco products  Current Medications:  Current Facility-Administered Medications  Medication Dose Route Frequency Provider Last Rate Last Admin   acetaminophen (TYLENOL) tablet 650 mg  650 mg Oral Q6H PRN Ajibola, Ene A, NP       alum & mag hydroxide-simeth (MAALOX/MYLANTA) 200-200-20 MG/5ML suspension 30 mL  30 mL Oral Q4H PRN Ajibola, Ene A, NP       magnesium hydroxide (MILK OF MAGNESIA) suspension 30 mL  30 mL Oral Daily PRN Ajibola, Ene A, NP       traZODone (DESYREL) tablet 50 mg  50 mg Oral QHS PRN,MR X 1 Ajibola, Ene A, NP   50 mg at 06/29/22 9628   Current Outpatient Medications  Medication Sig Dispense Refill   paliperidone (INVEGA SUSTENNA) 234 MG/1.5ML SUSY injection Inject 234 mg into the muscle once for 1 dose. (Patient not taking: Reported on 06/28/2022) 1.5 mL 0    PTA Medications: (Not in a hospital admission)      06/19/2022    6:37 AM 06/07/2022    9:00 AM 06/05/2022   10:04 AM  Depression screen PHQ 2/9  Decreased Interest '3 3 1  '$ Down, Depressed, Hopeless '3 3 1  '$ PHQ - 2 Score '6 6 2  '$ Altered sleeping 2 1 0  Tired, decreased energy '1 1 1  '$ Change in appetite 1 0 0  Feeling bad or failure about yourself  '3 3 1  '$ Trouble concentrating 2 1 0  Moving slowly or fidgety/restless 1 3 0  Suicidal thoughts 2 3 0  PHQ-9 Score '18 18 4  '$ Difficult doing work/chores Very difficult Somewhat difficult Not difficult at all    [place] [facility] from 06/28/2022 in [facility] ED from 06/25/2022 in [facility] from 06/19/2022 in [facility] CATEGORY High Risk High Risk High Risk       Musculoskeletal  Strength & Muscle Tone: within normal limits Gait & Station: normal Patient leans: N/A  Psychiatric Specialty Exam  Presentation   General Appearance: Disheveled  Eye Contact:Good  Speech:Clear and Coherent  Speech Volume:Normal  Handedness:Right   Mood and Affect  Mood:Euthymic  Affect:Congruent   Thought Process  Thought Processes:Goal Directed  Descriptions of Associations:Intact  Orientation:Full (Time, Place and Person)  Thought Content:WDL  Diagnosis of Schizophrenia or Schizoaffective disorder in past: Yes  Duration of Psychotic Symptoms: Greater than six months   Hallucinations:Hallucinations: None  Ideas of Reference:None  Suicidal Thoughts:Suicidal Thoughts: Yes, Active SI Active Intent and/or Plan: With Plan SI Passive Intent and/or Plan: Without Plan; Without Intent  Homicidal Thoughts:Homicidal Thoughts: No   Sensorium  Memory:Immediate Good; Recent Good; Remote Fair  Judgment:Fair  Insight:Shallow   Executive Functions  Concentration:Fair  Attention Span:Fair  Recall:Poor  Fund of Knowledge:Fair  Language:Fair   Psychomotor Activity  Psychomotor Activity:Psychomotor Activity: Normal   Assets  Assets:Communication Skills; Desire for Improvement; Physical Health   Sleep  Sleep:Sleep: Poor Number of Hours of Sleep: 3   Nutritional Assessment (For OBS and FBC admissions only) Has the patient had a weight loss or gain of 10 pounds or more in the last 3 months?: No Has the patient had a decrease in food intake/or appetite?: No Does the patient have dental problems?: No Does the patient have eating habits or behaviors that may be indicators of an eating disorder including binging or inducing vomiting?: No Has the patient recently lost weight without trying?: 0 Has the patient been eating poorly because of a decreased appetite?: 0 Malnutrition Screening Tool Score: 0    Physical Exam  Physical Exam Vitals and nursing note reviewed.  Cardiovascular:     Rate and Rhythm: Normal rate and regular rhythm.  Neurological:     Mental Status: He is alert and  oriented to person, place, and time.  Psychiatric:        Attention and Perception: Attention normal.        Mood and Affect: Mood normal.        Speech: Speech normal.        Behavior: Behavior normal.        Thought Content: Thought content normal.        Cognition and Memory: Cognition normal.        Judgment: Judgment normal.    Review of Systems  Genitourinary: Negative.   Psychiatric/Behavioral:  Negative for depression and suicidal ideas. The patient is nervous/anxious.   All other systems reviewed and are negative.  Blood pressure 124/83, pulse (!) 56, temperature 97.9 F (36.6 C), temperature source Oral, resp. rate 20, SpO2 99 %. There is no height or weight on file to calculate BMI.  Demographic Factors:  Male, Adolescent or young adult, and Unemployed  Loss Factors: Financial problems/change in socioeconomic status  Historical Factors: NA  Risk Reduction Factors:   NA  Continued Clinical Symptoms:  Schizophrenia:   Depressive state  Cognitive Features That Contribute To Risk:  Closed-mindedness    Suicide Risk:  Minimal: No identifiable suicidal ideation.  Patients presenting with no risk factors but with morbid ruminations; may be classified as minimal risk based on the severity of the depressive symptoms  Plan Of Care/Follow-up recommendations:  Activity:  as tolerated Diet:  heart healthy  Disposition: Take all medications as prescribed. Keep all follow-up appointments as scheduled.  Do not consume alcohol or use illegal drugs while on prescription medications. Report any adverse effects from your medications to your primary care provider promptly.  In the event of recurrent symptoms or worsening symptoms, call 911, a crisis hotline, or go to the nearest emergency department for evaluation.    Derrill Center, NP 06/29/2022, 9:49 AM

## 2022-06-29 NOTE — ED Notes (Signed)
Pt was awoken by another peer on the unit, after peer removed his shirt and started yelling at this patient. Patient did not engage. Patient denies any acute concerns, does not appear to be responding to internal stimuli. Denies any SI. Breakfast given of cereal and juice. Pt is safe.

## 2022-07-03 ENCOUNTER — Emergency Department (HOSPITAL_COMMUNITY)
Admission: EM | Admit: 2022-07-03 | Discharge: 2022-07-04 | Disposition: A | Discharge status: Home / Self Care | Payer: Medicaid Other | Attending: Emergency Medicine | Admitting: Emergency Medicine

## 2022-07-03 ENCOUNTER — Encounter (HOSPITAL_COMMUNITY): Payer: Self-pay

## 2022-07-03 ENCOUNTER — Ambulatory Visit (HOSPITAL_COMMUNITY): Payer: Self-pay | Admitting: Licensed Clinical Social Worker

## 2022-07-03 ENCOUNTER — Other Ambulatory Visit: Payer: Self-pay

## 2022-07-03 DIAGNOSIS — R45851 Suicidal ideations: Secondary | ICD-10-CM | POA: Diagnosis present

## 2022-07-03 DIAGNOSIS — Z5900 Homelessness unspecified: Secondary | ICD-10-CM | POA: Diagnosis not present

## 2022-07-03 LAB — COMPREHENSIVE METABOLIC PANEL
ALT: 23 U/L (ref 0–44)
AST: 31 U/L (ref 15–41)
Albumin: 3.6 g/dL (ref 3.5–5.0)
Alkaline Phosphatase: 46 U/L (ref 38–126)
Anion gap: 8 (ref 5–15)
BUN: 22 mg/dL — ABNORMAL HIGH (ref 6–20)
CO2: 24 mmol/L (ref 22–32)
Calcium: 8.9 mg/dL (ref 8.9–10.3)
Chloride: 110 mmol/L (ref 98–111)
Creatinine, Ser: 1.06 mg/dL (ref 0.61–1.24)
GFR, Estimated: >60 mL/min (ref >60)
Glucose, Bld: 95 mg/dL (ref 70–99)
Potassium: 3.2 mmol/L — ABNORMAL LOW (ref 3.5–5.1)
Sodium: 142 mmol/L (ref 135–145)
Total Bilirubin: 0.8 mg/dL (ref 0.3–1.2)
Total Protein: 6.9 g/dL (ref 6.5–8.1)

## 2022-07-03 LAB — CBC
HCT: 40.6 % (ref 39.0–52.0)
Hemoglobin: 13.5 g/dL (ref 13.0–17.0)
MCH: 28.1 pg (ref 26.0–34.0)
MCHC: 33.3 g/dL (ref 30.0–36.0)
MCV: 84.6 fL (ref 80.0–100.0)
Platelets: 309 10*3/uL (ref 150–400)
RBC: 4.8 MIL/uL (ref 4.22–5.81)
RDW: 13.7 % (ref 11.5–15.5)
WBC: 6 10*3/uL (ref 4.0–10.5)
nRBC: 0 % (ref 0.0–0.2)

## 2022-07-03 LAB — RAPID URINE DRUG SCREEN, HOSP PERFORMED
Amphetamines: NOT DETECTED
Barbiturates: NOT DETECTED
Benzodiazepines: NOT DETECTED
Cocaine: POSITIVE — AB
Opiates: NOT DETECTED
Tetrahydrocannabinol: POSITIVE — AB

## 2022-07-03 LAB — ETHANOL: Alcohol, Ethyl (B): <10 mg/dL (ref <10)

## 2022-07-03 LAB — SALICYLATE LEVEL: Salicylate Lvl: <7 mg/dL — ABNORMAL LOW (ref 7.0–30.0)

## 2022-07-03 LAB — ACETAMINOPHEN LEVEL: Acetaminophen (Tylenol), Serum: <10 ug/mL — ABNORMAL LOW (ref 10–30)

## 2022-07-03 NOTE — ED Triage Notes (Signed)
Pt BIB EMS with c/o SI and it was raining so he wanted to get out of the rain.

## 2022-07-03 NOTE — ED Provider Notes (Signed)
[facility] DEPT Provider Note   CSN: [id number] Arrival date & time: 07/03/22  2100     History  Chief Complaint  Patient presents with   Suicidal    Tyler White is a 26 y.o. male.  HPI Patient initially presented to the ED secondary to get on the right.  He is homeless.  He then reported that he was suicidal.  To me he states that he wants to sleep here all night because he does not like where he is currently staying.  He is apparently staying in an abandoned building where states that he has his own room.  However he is bothered because people come and go in and out of his room without his permission.  He states he sometimes uses drugs, but has not used any today.  He states he wants to be evaluated for feeling of suicidal ideation.  He states that he needs his "shot for suicidal thoughts."  He states that it was due today.    Home Medications Prior to Admission medications   Medication Sig Start Date End Date Taking? Authorizing Provider  paliperidone (INVEGA SUSTENNA) 234 MG/1.5ML SUSY injection Inject 234 mg into the muscle once for 1 dose. Patient not taking: Reported on 06/28/2022 04/16/22 06/28/22  Tharon Aquas, NP  gabapentin (NEURONTIN) 400 MG capsule Take 1 capsule (400 mg total) by mouth 3 (three) times daily. Patient not taking: Reported on 06/09/2021 04/30/21 06/10/21  Ethelene Hal, NP      Allergies    Patient has no known allergies.    Review of Systems   Review of Systems  Physical Exam Updated Vital Signs BP 134/78 (BP Location: Left Arm)   Pulse (!) 58   Temp 97.9 F (36.6 C) (Oral)   Resp 15   Ht '5\' 8"'$  (1.727 m)   Wt 69.9 kg   SpO2 100%   BMI 23.42 kg/m  Physical Exam Vitals and nursing note reviewed.  Constitutional:      General: He is not in acute distress.    Appearance: He is well-developed. He is not ill-appearing or diaphoretic.  HENT:     Head: Normocephalic and atraumatic.     Right Ear:  External ear normal.     Left Ear: External ear normal.  Eyes:     Conjunctiva/sclera: Conjunctivae normal.     Pupils: Pupils are equal, round, and reactive to light.  Neck:     Trachea: Phonation normal.  Cardiovascular:     Rate and Rhythm: Normal rate.  Pulmonary:     Effort: Pulmonary effort is normal.  Abdominal:     General: There is no distension.  Musculoskeletal:        General: Normal range of motion.     Cervical back: Normal range of motion and neck supple.  Skin:    General: Skin is warm and dry.  Neurological:     Mental Status: He is alert and oriented to person, place, and time.     Cranial Nerves: No cranial nerve deficit.     Sensory: No sensory deficit.     Motor: No abnormal muscle tone.     Coordination: Coordination normal.  Psychiatric:        Attention and Perception: Attention normal.        Mood and Affect: Mood normal.        Speech: Speech normal.        Behavior: Behavior is cooperative.  Thought Content: Thought content is not delusional. Thought content includes suicidal ideation. Thought content does not include homicidal or suicidal plan.        Cognition and Memory: Cognition normal.        Judgment: Judgment normal.     ED Results / Procedures / Treatments   Labs (all labs ordered are listed, but only abnormal results are displayed) Labs Reviewed  COMPREHENSIVE METABOLIC PANEL - Abnormal; Notable for the following components:      Result Value   Potassium 3.2 (*)    BUN 22 (*)    All other components within normal limits  SALICYLATE LEVEL - Abnormal; Notable for the following components:   Salicylate Lvl <2.3 (*)    All other components within normal limits  ACETAMINOPHEN LEVEL - Abnormal; Notable for the following components:   Acetaminophen (Tylenol), Serum <10 (*)    All other components within normal limits  ETHANOL  CBC  RAPID URINE DRUG SCREEN, HOSP PERFORMED    EKG EKG Interpretation  Date/Time:  Wednesday July 03 2022 21:08:23 EDT Ventricular Rate:  63 PR Interval:  156 QRS Duration: 80 QT Interval:  397 QTC Calculation: 407 R Axis:   84 Text Interpretation: Sinus rhythm since last tracing no significant change Confirmed by Daleen Bo [id number] on 07/03/2022 10:04:36 PM  Radiology No results found.  Procedures Procedures    Medications Ordered in ED Medications - No data to display  ED Course/ Medical Decision Making/ A&P                           Medical Decision Making Patient presenting with thoughts of suicide and stating he needs his Invega shot.  It was last administered, 06/05/2022.  He is homeless.  He has been homeless for 4 years per his report.  Problems Addressed: Homelessness: chronic illness or injury Suicidal ideation:    Details: No clear evidence for acute high risk suicide status  Amount and/or Complexity of Data Reviewed Independent Historian:     Details: Cogent historian Labs: ordered.  Risk Decision regarding hospitalization. Risk Details: Homeless patient, presented because it was raining outside and then claimed he was suicidal.  He does not appear to be high risk for suicide at this time.           Final Clinical Impression(s) / ED Diagnoses Final diagnoses:  Suicidal ideation  Homelessness    Rx / DC Orders ED Discharge Orders     None         Daleen Bo, MD 07/03/22 2225

## 2022-07-04 NOTE — ED Provider Notes (Signed)
  Provider Note MRN:  [id number]  Arrival date & time: 07/04/22    ED Course and Medical Decision Making  Assumed care from [place] at shift change.  See not from prior team for complete details, in brief: 26 yo male who presented to ED 2/2 raining outside, asking for place to stay Homeless, hx cocaine use Not actively suicidal, no plan, passive suicidal thoughts; asked patient further details regarding this reported that he was not actually suicidal he just did not want to be on the streets tonight.  Patient provided food, drink, change of clothes.  Re-assessed, no SI or HI. Given outpatient follow up resources, return precautions.   The patient improved significantly and was discharged in stable condition. Detailed discussions were had with the patient regarding current findings, and need for close f/u with PCP or on call doctor. The patient has been instructed to return immediately if the symptoms worsen in any way for re-evaluation. Patient verbalized understanding and is in agreement with current care plan. All questions answered prior to discharge.    Procedures  Final Clinical Impressions(s) / ED Diagnoses     ICD-10-CM   1. Homelessness  Z59.00     2. Suicidal ideation  R45.851       ED Discharge Orders     None         Discharge Instructions      It was a pleasure caring for you today in the emergency department.  Please return to the emergency department for any worsening or worrisome symptoms.          Jeanell Sparrow, DO 07/04/22 [phone]

## 2022-07-04 NOTE — Discharge Instructions (Signed)
It was a pleasure caring for you today in the emergency department. ° °Please return to the emergency department for any worsening or worrisome symptoms. ° ° °

## 2022-07-04 NOTE — ED Notes (Signed)
EDP at bedside  

## 2022-07-04 NOTE — ED Notes (Signed)
Report received by previous RN. Pt had been dressed out into purple scrubs. Is currently sleeping in hallway bed. Sitter remains at bedside. Pending TTS

## 2022-07-04 NOTE — ED Notes (Signed)
Pt belonging were returned. Endorses receiving all belongings. Denies missing any items.

## 2022-07-04 NOTE — ED Notes (Addendum)
Pt dressed himself. When returning to discussed discharge instructions with pt. Pt had already departed.

## 2022-07-16 ENCOUNTER — Telehealth (HOSPITAL_COMMUNITY): Payer: Self-pay

## 2022-07-16 NOTE — BH Assessment (Signed)
Care Management - [facility] Follow Up Discharges   Writer attempted to make contact with patient today and was unsuccessful.  Writer left a HIPPA compliant voice message.   Per chart review, patient declined homeless resources.  Per chart review, patient was provided with outpatient resources.

## 2022-08-13 ENCOUNTER — Ambulatory Visit (HOSPITAL_COMMUNITY)
Admission: EM | Admit: 2022-08-13 | Discharge: 2022-08-14 | Disposition: A | Discharge status: Home / Self Care | Payer: Medicaid Other | Source: Home / Self Care

## 2022-08-13 ENCOUNTER — Other Ambulatory Visit: Payer: Self-pay

## 2022-08-13 ENCOUNTER — Emergency Department (HOSPITAL_COMMUNITY)
Admission: EM | Admit: 2022-08-13 | Discharge: 2022-08-13 | Disposition: A | Discharge status: Other Acute Inpatient Hospital | Payer: Medicaid Other | Attending: Emergency Medicine | Admitting: Emergency Medicine

## 2022-08-13 DIAGNOSIS — Z20822 Contact with and (suspected) exposure to covid-19: Secondary | ICD-10-CM | POA: Insufficient documentation

## 2022-08-13 DIAGNOSIS — Z5900 Homelessness unspecified: Secondary | ICD-10-CM | POA: Insufficient documentation

## 2022-08-13 DIAGNOSIS — Z79899 Other long term (current) drug therapy: Secondary | ICD-10-CM | POA: Insufficient documentation

## 2022-08-13 DIAGNOSIS — F1414 Cocaine abuse with cocaine-induced mood disorder: Secondary | ICD-10-CM | POA: Diagnosis present

## 2022-08-13 DIAGNOSIS — F250 Schizoaffective disorder, bipolar type: Secondary | ICD-10-CM | POA: Insufficient documentation

## 2022-08-13 DIAGNOSIS — F1910 Other psychoactive substance abuse, uncomplicated: Secondary | ICD-10-CM | POA: Insufficient documentation

## 2022-08-13 DIAGNOSIS — F14188 Cocaine abuse with other cocaine-induced disorder: Secondary | ICD-10-CM | POA: Diagnosis not present

## 2022-08-13 DIAGNOSIS — Z91148 Patient's other noncompliance with medication regimen for other reason: Secondary | ICD-10-CM | POA: Insufficient documentation

## 2022-08-13 DIAGNOSIS — F17210 Nicotine dependence, cigarettes, uncomplicated: Secondary | ICD-10-CM | POA: Insufficient documentation

## 2022-08-13 DIAGNOSIS — F209 Schizophrenia, unspecified: Secondary | ICD-10-CM | POA: Diagnosis present

## 2022-08-13 DIAGNOSIS — F1490 Cocaine use, unspecified, uncomplicated: Secondary | ICD-10-CM

## 2022-08-13 DIAGNOSIS — R45851 Suicidal ideations: Secondary | ICD-10-CM

## 2022-08-13 DIAGNOSIS — F1290 Cannabis use, unspecified, uncomplicated: Secondary | ICD-10-CM | POA: Insufficient documentation

## 2022-08-13 LAB — RESP PANEL BY RT-PCR (FLU A&B, COVID) ARPGX2
Influenza A by PCR: NEGATIVE
Influenza B by PCR: NEGATIVE
SARS Coronavirus 2 by RT PCR: NEGATIVE

## 2022-08-13 LAB — CBC WITH DIFFERENTIAL/PLATELET
Abs Immature Granulocytes: 0.03 10*3/uL (ref 0.00–0.07)
Basophils Absolute: 0.1 10*3/uL (ref 0.0–0.1)
Basophils Relative: 1 %
Eosinophils Absolute: 0.1 10*3/uL (ref 0.0–0.5)
Eosinophils Relative: 0 %
HCT: 41.2 % (ref 39.0–52.0)
Hemoglobin: 14.5 g/dL (ref 13.0–17.0)
Immature Granulocytes: 0 %
Lymphocytes Relative: 14 %
Lymphs Abs: 1.7 10*3/uL (ref 0.7–4.0)
MCH: 28.3 pg (ref 26.0–34.0)
MCHC: 35.2 g/dL (ref 30.0–36.0)
MCV: 80.3 fL (ref 80.0–100.0)
Monocytes Absolute: 1.3 10*3/uL — ABNORMAL HIGH (ref 0.1–1.0)
Monocytes Relative: 11 %
Neutro Abs: 9.4 10*3/uL — ABNORMAL HIGH (ref 1.7–7.7)
Neutrophils Relative %: 74 %
Platelets: 314 10*3/uL (ref 150–400)
RBC: 5.13 MIL/uL (ref 4.22–5.81)
RDW: 12.7 % (ref 11.5–15.5)
WBC: 12.6 10*3/uL — ABNORMAL HIGH (ref 4.0–10.5)
nRBC: 0 % (ref 0.0–0.2)

## 2022-08-13 LAB — POCT URINE DRUG SCREEN - MANUAL ENTRY (I-SCREEN)
POC Amphetamine UR: NOT DETECTED
POC Buprenorphine (BUP): NOT DETECTED
POC Cocaine UR: POSITIVE — AB
POC Marijuana UR: NOT DETECTED
POC Methadone UR: NOT DETECTED
POC Methamphetamine UR: NOT DETECTED
POC Morphine: NOT DETECTED
POC Oxazepam (BZO): NOT DETECTED
POC Oxycodone UR: NOT DETECTED
POC Secobarbital (BAR): NOT DETECTED

## 2022-08-13 LAB — LIPID PANEL
Cholesterol: 186 mg/dL (ref 0–200)
HDL: 72 mg/dL (ref >40)
LDL Cholesterol: 99 mg/dL (ref 0–99)
Total CHOL/HDL Ratio: 2.6 RATIO
Triglycerides: 74 mg/dL (ref <150)
VLDL: 15 mg/dL (ref 0–40)

## 2022-08-13 LAB — ETHANOL: Alcohol, Ethyl (B): <10 mg/dL (ref <10)

## 2022-08-13 LAB — COMPREHENSIVE METABOLIC PANEL
ALT: 32 U/L (ref 0–44)
AST: 98 U/L — ABNORMAL HIGH (ref 15–41)
Albumin: 4.4 g/dL (ref 3.5–5.0)
Alkaline Phosphatase: 54 U/L (ref 38–126)
Anion gap: 12 (ref 5–15)
BUN: 21 mg/dL — ABNORMAL HIGH (ref 6–20)
CO2: 22 mmol/L (ref 22–32)
Calcium: 9.9 mg/dL (ref 8.9–10.3)
Chloride: 105 mmol/L (ref 98–111)
Creatinine, Ser: 1.03 mg/dL (ref 0.61–1.24)
GFR, Estimated: >60 mL/min (ref >60)
Glucose, Bld: 106 mg/dL — ABNORMAL HIGH (ref 70–99)
Potassium: 3.9 mmol/L (ref 3.5–5.1)
Sodium: 139 mmol/L (ref 135–145)
Total Bilirubin: 0.7 mg/dL (ref 0.3–1.2)
Total Protein: 8 g/dL (ref 6.5–8.1)

## 2022-08-13 MED ORDER — MAGNESIUM HYDROXIDE 400 MG/5ML PO SUSP: 30.0000 mL | Freq: Every day | ORAL | Status: DC | PRN

## 2022-08-13 MED ORDER — ALUM & MAG HYDROXIDE-SIMETH 200-200-20 MG/5ML PO SUSP: 30.0000 mL | ORAL | Status: DC | PRN

## 2022-08-13 MED ORDER — NICOTINE 21 MG/24HR TD PT24
21.0000 mg | MEDICATED_PATCH | Freq: Every day | TRANSDERMAL | Status: DC
Start: 2022-08-13 — End: 2022-08-13
  Administered 2022-08-13: 21 mg via TRANSDERMAL

## 2022-08-13 MED ORDER — HYDROXYZINE HCL 25 MG PO TABS
25.0000 mg | ORAL_TABLET | Freq: Three times a day (TID) | ORAL | Status: DC | PRN
  Administered 2022-08-13: 25 mg via ORAL
  Filled 2022-08-13: qty 1

## 2022-08-13 MED ORDER — ACETAMINOPHEN 325 MG PO TABS: 650.0000 mg | ORAL_TABLET | Freq: Four times a day (QID) | ORAL | Status: DC | PRN

## 2022-08-13 MED ORDER — PALIPERIDONE ER 3 MG PO TB24
3.0000 mg | ORAL_TABLET | Freq: Every day | ORAL | Status: DC
  Filled 2022-08-13: qty 1

## 2022-08-13 MED ORDER — TRAZODONE HCL 50 MG PO TABS: 50.0000 mg | ORAL_TABLET | Freq: Every evening | ORAL | Status: DC | PRN

## 2022-08-13 MED ORDER — IBUPROFEN 400 MG PO TABS: 600.0000 mg | ORAL_TABLET | Freq: Three times a day (TID) | ORAL | Status: DC | PRN

## 2022-08-13 MED ORDER — RISPERIDONE 1 MG PO TABS
1.0000 mg | ORAL_TABLET | Freq: Once | ORAL | Status: AC
  Administered 2022-08-13: 1 mg via ORAL
  Filled 2022-08-13: qty 1

## 2022-08-13 NOTE — ED Notes (Signed)
Pt laying in his bed calm and cooperative watching television. Pt has no c/o pain ro distress. Will continue to monitor for safety

## 2022-08-13 NOTE — ED Triage Notes (Signed)
Pt reports he is suicidal.  Pt has hx of same he reports.  Pt is homeless and endorses using cocaine.  Does not have a plan at this time just endorses the thoughts of suicide.

## 2022-08-13 NOTE — Consult Note (Signed)
[facility] ED ASSESSMENT   Reason for Consult:  SI Referring Physician:  Dr. Fuller Plan Patient Identification: Tyler White MRN:  [id number] ED Chief Complaint: Suicidal ideation  Diagnosis:  Principal Problem:   Suicidal ideation Active Problems:   Cocaine abuse with cocaine-induced mood disorder ([place])   Schizophrenia ([place])   ED Assessment Time Calculation: Start Time: 1630 Stop Time: 1700 Total Time in Minutes (Assessment Completion): 30   Subjective:   Tyler White is a 26 y.o. male patient who voluntarily presented to [facility] ED due to wanting psychotropic medication and reporting suicidal ideations with no plan or intent.  HPI:   Patient seen at [facility] ED for face-to-face evaluation.  Patient has flat affect and minimally engages in conversation.  He tells me he went to jail on July 11 due to trespassing and was released yesterday.  He tells me he was on Risperdal 1 mg while in jail.  He talked about previously getting [place] injection and is hoping to restart that.  He is currently homeless and unemployed.  He denies having any family or friends that are supportive or that we could reach out to.  He continues to endorse suicidal ideations denies having a plan or intent.  Denies homicidal ideations.  Denies any auditory or visual hallucinations.  He does report recent cocaine use since being released from jail.  Denies any other illicit drugs or alcohol use.  He denies any current withdrawal symptoms, however he does look restless in his bed.  He does report not sleeping well at night only getting around 3 to 4 hours on average.  Patient is agreeable to restarting oral Invega tonight and receiving [place] injection possibly tomorrow.  Patient is agreeable to voluntary transfer to behavioral health urgent care for overnight observation.  Patient's affect is blunted and he is slow to respond at times.  He does not appear to be responding to internal stimuli at this time, but he  occasionally looks preoccupied during assessment.  He was able to engage in linear conversation.  Patient is not able to contract for safety at this time and continues to endorse suicidal ideations.  There are concerns for secondary gain of housing and malingering.  Patient has presented to an ED or UC 24 times in the past 3 months.  Patient usually presents with suicidal ideations, polysubstance abuse, and homelessness.  However, I do think it would be beneficial to the patient to be restarted on his LAI.  Patient reviewed by Tyler Lolling, NP and will be transferred to behavioral health urgent care.  Dr. Armandina White and ED RN notified of disposition.  Past Psychiatric History:  History of schizoaffective disorder bipolar type, polysubstance abuse, chronic homelessness, medication noncompliance.  Numerous ED presentations for suicidal ideations and housing stability.  Risk to Self or Others: Is the patient at risk to self? Yes Has the patient been a risk to self in the past 6 months? No Has the patient been a risk to self within the distant past? No Is the patient a risk to others? No Has the patient been a risk to others in the past 6 months? No Has the patient been a risk to others within the distant past? No  [place] Scale:  [place] ED from 08/13/2022 in [place] ED from 07/03/2022 in [place] DEPT ED from 06/28/2022 in [place] DEPT  C-SSRS RISK CATEGORY High Risk High Risk High Risk  Past Medical History:  Past Medical History:  Diagnosis Date   Hernia, inguinal, right    Inguinal hernia    right   Psychiatric illness    Schizophrenia ([place])    No past surgical history on file. Family History:  Family History  Problem Relation Age of Onset   Psychiatric Illness Mother    Hypertension Sister     Social History:  Social History   Substance and Sexual Activity   Alcohol Use Not Currently     Social History   Substance and Sexual Activity  Drug Use Not Currently   Types: Marijuana, Cocaine   Comment: crack/cocaine occasionally    Social History   Socioeconomic History   Marital status: Single    Spouse name: Not on file   Number of children: Not on file   Years of education: 10   Highest education level: Not on file  Occupational History   Not on file  Tobacco Use   Smoking status: Some Days    Packs/day: 0.50    Years: 5.00    Total pack years: 2.50    Types: Cigarettes   Smokeless tobacco: Never  Vaping Use   Vaping Use: Never used  Substance and Sexual Activity   Alcohol use: Not Currently   Drug use: Not Currently    Types: Marijuana, Cocaine    Comment: crack/cocaine occasionally   Sexual activity: Yes    Birth control/protection: None  Other Topics Concern   Not on file  Social History Narrative   ** Merged History Encounter **       ** Merged History Encounter **       Social Determinants of Health   Financial Resource Strain: Not on file  Food Insecurity: Not on file  Transportation Needs: Not on file  Physical Activity: Not on file  Stress: Not on file  Social Connections: Not on file   Additional Social History:    Allergies:  No Known Allergies  Labs:  Results for orders placed or performed during the hospital encounter of 08/13/22 (from the past 48 hour(s))  Resp Panel by RT-PCR (Flu A&B, Covid) Anterior Nasal Swab     Status: None   Collection Time: 08/13/22 12:40 PM   Specimen: Anterior Nasal Swab  Result Value Ref Range   SARS Coronavirus 2 by RT PCR NEGATIVE NEGATIVE    Comment: (NOTE) SARS-CoV-2 target nucleic acids are NOT DETECTED.  The SARS-CoV-2 RNA is generally detectable in upper respiratory specimens during the acute phase of infection. The lowest concentration of SARS-CoV-2 viral copies this assay can detect is 138 copies/mL. A negative result does not preclude  SARS-Cov-2 infection and should not be used as the sole basis for treatment or other patient management decisions. A negative result may occur with  improper specimen collection/handling, submission of specimen other than nasopharyngeal swab, presence of viral mutation(s) within the areas targeted by this assay, and inadequate number of viral copies(<138 copies/mL). A negative result must be combined with clinical observations, patient history, and epidemiological information. The expected result is Negative.  Fact Sheet for Patients:  EntrepreneurPulse.com.au  Fact Sheet for Healthcare Providers:  IncredibleEmployment.be  This test is no t yet approved or cleared by the [place] FDA and  has been authorized for detection and/or diagnosis of SARS-CoV-2 by FDA under an Emergency Use Authorization (EUA). This EUA will remain  in effect (meaning this test can be used) for the duration of the COVID-19 declaration under Section 564(b)(1) of the  Act, 21 U.S.C.section 360bbb-3(b)(1), unless the authorization is terminated  or revoked sooner.       Influenza A by PCR NEGATIVE NEGATIVE   Influenza B by PCR NEGATIVE NEGATIVE    Comment: (NOTE) The Xpert Xpress SARS-CoV-2/FLU/RSV plus assay is intended as an aid in the diagnosis of influenza from Nasopharyngeal swab specimens and should not be used as a sole basis for treatment. Nasal washings and aspirates are unacceptable for Xpert Xpress SARS-CoV-2/FLU/RSV testing.  Fact Sheet for Patients: EntrepreneurPulse.com.au  Fact Sheet for Healthcare Providers: IncredibleEmployment.be  This test is not yet approved or cleared by the [place] FDA and has been authorized for detection and/or diagnosis of SARS-CoV-2 by FDA under an Emergency Use Authorization (EUA). This EUA will remain in effect (meaning this test can be used) for the duration of the COVID-19  declaration under Section 564(b)(1) of the Act, 21 U.S.C. section 360bbb-3(b)(1), unless the authorization is terminated or revoked.  Performed at [facility], [place] [address] [place], [place] [address]   Comprehensive metabolic panel     Status: Abnormal   Collection Time: 08/13/22 12:46 PM  Result Value Ref Range   Sodium 139 135 - 145 mmol/L   Potassium 3.9 3.5 - 5.1 mmol/L   Chloride 105 98 - 111 mmol/L   CO2 22 22 - 32 mmol/L   Glucose, Bld 106 (H) 70 - 99 mg/dL    Comment: Glucose reference range applies only to samples taken after fasting for at least 8 hours.   BUN 21 (H) 6 - 20 mg/dL   Creatinine, Ser 1.03 0.61 - 1.24 mg/dL   Calcium 9.9 8.9 - 10.3 mg/dL   Total Protein 8.0 6.5 - 8.1 g/dL   Albumin 4.4 3.5 - 5.0 g/dL   AST 98 (H) 15 - 41 U/L   ALT 32 0 - 44 U/L   Alkaline Phosphatase 54 38 - 126 U/L   Total Bilirubin 0.7 0.3 - 1.2 mg/dL   GFR, Estimated >60 >60 mL/min    Comment: (NOTE) Calculated using the CKD-EPI Creatinine Equation (2021)    Anion gap 12 5 - 15    Comment: Performed at [place] [address], [place], [place] [address]  Ethanol     Status: None   Collection Time: 08/13/22 12:46 PM  Result Value Ref Range   Alcohol, Ethyl (B) <10 <10 mg/dL    Comment: (NOTE) Lowest detectable limit for serum alcohol is 10 mg/dL.  For medical purposes only. Performed at [facility], [place] [address], [place], [place] [address]   CBC with Diff     Status: Abnormal   Collection Time: 08/13/22 12:46 PM  Result Value Ref Range   WBC 12.6 (H) 4.0 - 10.5 K/uL   RBC 5.13 4.22 - 5.81 MIL/uL   Hemoglobin 14.5 13.0 - 17.0 g/dL   HCT 41.2 39.0 - 52.0 %   MCV 80.3 80.0 - 100.0 fL   MCH 28.3 26.0 - 34.0 pg   MCHC 35.2 30.0 - 36.0 g/dL   RDW 12.7 11.5 - 15.5 %   Platelets 314 150 - 400 K/uL   nRBC 0.0 0.0 - 0.2 %   Neutrophils Relative % 74 %   Neutro Abs 9.4 (H) 1.7 - 7.7 K/uL   Lymphocytes Relative 14 %   Lymphs Abs 1.7 0.7 - 4.0 K/uL    Monocytes Relative 11 %   Monocytes Absolute 1.3 (H) 0.1 - 1.0 K/uL   Eosinophils Relative  0 %   Eosinophils Absolute 0.1 0.0 - 0.5 K/uL   Basophils Relative 1 %   Basophils Absolute 0.1 0.0 - 0.1 K/uL   Immature Granulocytes 0 %   Abs Immature Granulocytes 0.03 0.00 - 0.07 K/uL    Comment: Performed at [facility], [place] [address] [place], [place] [address]    Current Facility-Administered Medications  Medication Dose Route Frequency Provider Last Rate Last Admin   ibuprofen (ADVIL) tablet 600 mg  600 mg Oral Q8H PRN Sherwood Gambler, MD       nicotine (NICODERM CQ - dosed in mg/24 hours) patch 21 mg  21 mg Transdermal Daily Sherwood Gambler, MD   21 mg at 08/13/22 1630   paliperidone (INVEGA) 24 hr tablet 3 mg  3 mg Oral Daily Vesta Mixer, NP       Current Outpatient Medications  Medication Sig Dispense Refill   paliperidone (INVEGA SUSTENNA) 234 MG/1.5ML SUSY injection Inject 234 mg into the muscle once for 1 dose. (Patient not taking: Reported on 06/28/2022) 1.5 mL 0    Psychiatric Specialty Exam: Presentation  General Appearance: Fairly Groomed  Eye Contact:Fair  Speech:Clear and Coherent  Speech Volume:Normal  Handedness:Right   Mood and Affect  Mood:Depressed  Affect:Flat   Thought Process  Thought Processes:Linear; Coherent  Descriptions of Associations:Intact  Orientation:Full (Time, Place and Person)  Thought Content:Logical  History of Schizophrenia/Schizoaffective disorder:Yes  Duration of Psychotic Symptoms:Greater than six months  Hallucinations:Hallucinations: None  Ideas of Reference:None  Suicidal Thoughts:Suicidal Thoughts: Yes, Active SI Active Intent and/or Plan: Without Intent; Without Plan  Homicidal Thoughts:Homicidal Thoughts: No   Sensorium  Memory:Immediate Fair  Judgment:Poor  Insight:Poor   Executive Functions  Concentration:Fair  Attention Span:Fair  [place]   Psychomotor Activity  Psychomotor Activity:Psychomotor Activity: Normal   Assets  Assets:Communication Skills; Desire for Improvement; Physical Health; Resilience    Sleep  Sleep:Sleep: Fair   Physical Exam: Physical Exam Neurological:     Mental Status: He is alert and oriented to person, place, and time.  Psychiatric:        Behavior: Behavior is cooperative.        Thought Content: Thought content includes suicidal ideation.    Review of Systems  Psychiatric/Behavioral:  Positive for substance abuse and suicidal ideas. The patient has insomnia.   All other systems reviewed and are negative.  Blood pressure 110/77, pulse 86, temperature 98.8 F (37.1 C), temperature source Oral, resp. rate 16, SpO2 99 %. There is no height or weight on file to calculate BMI.  Medical Decision Making: Patient case reviewed and discussed with Dr. Dwyane Dee, Dr. Armandina White, and Tyler Lolling, NP.  Patient unable to contract for safety and would benefit from restarting his medications.  Will transfer to behavioral health urgent care for further med management and stability. ED RN notified of disposition.   -Start Invega '3mg'$  po tonight in lieu of receiving LAI tomorrow   Disposition:  Transfer to [facility] overnight observation  Vesta Mixer, NP 08/13/2022 4:59 PM

## 2022-08-13 NOTE — ED Provider Notes (Signed)
[facility] Continuous Assessment Admission H&P  Date: 08/13/22 Patient Name: Tyler White MRN: [id number] Chief Complaint: No chief complaint on file.     Diagnoses:  Final diagnoses:  Schizoaffective disorder, bipolar type ([place])  Homelessness  Noncompliance with medications  Cocaine abuse with cocaine-induced mood disorder ([place])  Crack cocaine use    HPI: ***  PHQ 2-9:  [place] ED from 06/19/2022 in [facility] ED from 06/05/2022 in [facility] ED from 03/11/2021 in [place]  Thoughts that you would be better off dead, or of hurting yourself in some way More than half the days Nearly every day Several days  PHQ-9 Total Score '18 18 17       '$ Flowsheet Row ED from 08/13/2022 in [facility] Most recent reading at 08/13/2022  6:47 PM ED from 08/13/2022 in [place] Most recent reading at 08/13/2022 12:29 PM ED from 07/03/2022 in [place] DEPT Most recent reading at 07/03/2022  9:06 PM  C-SSRS RISK CATEGORY High Risk High Risk High Risk        Total Time spent with patient: {Time; 15 min - 8 hours:17441}  Musculoskeletal  Strength & Muscle Tone: {desc; muscle tone:32375} Gait & Station: {PE GAIT ED QMGQ:67619} Patient leans: {Patient Leans:21022755}  Psychiatric Specialty Exam  Presentation General Appearance: Disheveled  Eye Contact:Poor  Speech:Clear and Coherent  Speech Volume:Normal  Handedness:Right   Mood and Affect  Mood:Depressed  Affect:Flat   Thought Process  Thought Processes:Coherent  Descriptions of Associations:Intact  Orientation:Full (Time, Place and Person)  Thought Content:WDL  Diagnosis of Schizophrenia or Schizoaffective disorder in past: Yes  Duration of Psychotic Symptoms: Greater than six months  Hallucinations:Hallucinations:  Auditory; Visual Description of Auditory Hallucinations: "I hear voices, I dont know what they say" Description of Visual Hallucinations: "like dark circles and stuff".  Ideas of Reference:None  Suicidal Thoughts:Suicidal Thoughts: Yes, Passive SI Active Intent and/or Plan: Without Plan SI Passive Intent and/or Plan: Without Plan  Homicidal Thoughts:Homicidal Thoughts: No   Sensorium  Memory:Immediate Fair  Judgment:Poor  Insight:Poor   Executive Functions  Concentration:Fair  Attention Span:Poor  [employer]   Psychomotor Activity  Psychomotor Activity:Psychomotor Activity: Normal   Assets  Assets:Communication Skills; Desire for Improvement   Sleep  Sleep:Sleep: Fair   Nutritional Assessment (For OBS and FBC admissions only) Has the patient had a weight loss or gain of 10 pounds or more in the last 3 months?: No Has the patient had a decrease in food intake/or appetite?: No Does the patient have dental problems?: No Does the patient have eating habits or behaviors that may be indicators of an eating disorder including binging or inducing vomiting?: No Has the patient recently lost weight without trying?: 0 Has the patient been eating poorly because of a decreased appetite?: 0 Malnutrition Screening Tool Score: 0    Physical Exam Constitutional:      General: He is not in acute distress.    Appearance: He is not diaphoretic.  HENT:     Head: Normocephalic.     Right Ear: External ear normal.     Left Ear: External ear normal.     Nose: No congestion.  Eyes:     General:        Right eye: No discharge.        Left eye: No discharge.  Cardiovascular:  Rate and Rhythm: Normal rate.  Pulmonary:     Effort: No respiratory distress.  Chest:     Chest wall: No tenderness.  Neurological:     Mental Status: He is alert and oriented to person, place, and time.  Psychiatric:        Attention and Perception:  He perceives auditory and visual hallucinations.        Mood and Affect: Mood is depressed. Affect is blunt and flat.        Speech: Speech normal.        Behavior: Behavior is cooperative.        Thought Content: Thought content is not paranoid or delusional. Thought content includes suicidal ideation. Thought content does not include homicidal ideation. Thought content does not include homicidal or suicidal plan.        Cognition and Memory: Cognition normal.        Judgment: Judgment is impulsive.    Review of Systems  Constitutional:  Negative for chills, diaphoresis and fever.  HENT:  Negative for congestion.   Eyes:  Negative for discharge.  Respiratory:  Negative for cough, hemoptysis and wheezing.   Cardiovascular:  Negative for chest pain and palpitations.  Gastrointestinal:  Negative for diarrhea, nausea and vomiting.  Neurological:  Negative for dizziness, seizures, loss of consciousness, weakness and headaches.  Psychiatric/Behavioral:  Positive for depression, hallucinations, substance abuse and suicidal ideas. Negative for memory loss. The patient is not nervous/anxious and does not have insomnia.     Blood pressure 112/82, pulse 99, temperature 98.1 F (36.7 C), temperature source Oral, resp. rate 18, SpO2 99 %. There is no height or weight on file to calculate BMI.  Past Psychiatric History: ***   Is the patient at risk to self? {YES/NO:21197} Has the patient been a risk to self in the past 6 months? {YES/NO:21197}.    Has the patient been a risk to self within the distant past? {YES/NO:21197}  Is the patient a risk to others? {YES/NO:21197}  Has the patient been a risk to others in the past 6 months? {YES/NO:21197}  Has the patient been a risk to others within the distant past? {YES/NO:21197}  Past Medical History:  Past Medical History:  Diagnosis Date   Hernia, inguinal, right    Inguinal hernia    right   Psychiatric illness    Schizophrenia ([place])    No  past surgical history on file.  Family History:  Family History  Problem Relation Age of Onset   Psychiatric Illness Mother    Hypertension Sister     Social History:  Social History   Socioeconomic History   Marital status: Single    Spouse name: Not on file   Number of children: Not on file   Years of education: 10   Highest education level: Not on file  Occupational History   Not on file  Tobacco Use   Smoking status: Some Days    Packs/day: 0.50    Years: 5.00    Total pack years: 2.50    Types: Cigarettes   Smokeless tobacco: Never  Vaping Use   Vaping Use: Never used  Substance and Sexual Activity   Alcohol use: Not Currently   Drug use: Not Currently    Types: Marijuana, Cocaine    Comment: crack/cocaine occasionally   Sexual activity: Yes    Birth control/protection: None  Other Topics Concern   Not on file  Social History Narrative   ** Merged History Encounter **       **  Merged History Encounter **       Social Determinants of Health   Financial Resource Strain: Not on file  Food Insecurity: Not on file  Transportation Needs: Not on file  Physical Activity: Not on file  Stress: Not on file  Social Connections: Not on file  Intimate Partner Violence: Not on file    SDOH:  SDOH Screenings   Alcohol Screen: Low Risk  (01/29/2022)   Alcohol Screen    Last Alcohol Screening Score (AUDIT): 0  Depression (PHQ2-9): High Risk (06/19/2022)   Depression (PHQ2-9)    PHQ-2 Score: 18  Financial Resource Strain: Not on file  Food Insecurity: Not on file  Housing: Not on file  Physical Activity: Not on file  Social Connections: Not on file  Stress: Not on file  Tobacco Use: High Risk (07/03/2022)   Patient History    Smoking Tobacco Use: Some Days    Smokeless Tobacco Use: Never    Passive Exposure: Not on file  Transportation Needs: Not on file    Last Labs:  Admission on 08/13/2022, Discharged on 08/13/2022  Component Date Value Ref Range  Status   SARS Coronavirus 2 by RT PCR 08/13/2022 NEGATIVE  NEGATIVE Final   Comment: (NOTE) SARS-CoV-2 target nucleic acids are NOT DETECTED.  The SARS-CoV-2 RNA is generally detectable in upper respiratory specimens during the acute phase of infection. The lowest concentration of SARS-CoV-2 viral copies this assay can detect is 138 copies/mL. A negative result does not preclude SARS-Cov-2 infection and should not be used as the sole basis for treatment or other patient management decisions. A negative result may occur with  improper specimen collection/handling, submission of specimen other than nasopharyngeal swab, presence of viral mutation(s) within the areas targeted by this assay, and inadequate number of viral copies(<138 copies/mL). A negative result must be combined with clinical observations, patient history, and epidemiological information. The expected result is Negative.  Fact Sheet for Patients:  EntrepreneurPulse.com.au  Fact Sheet for Healthcare Providers:  IncredibleEmployment.be  This test is no                          t yet approved or cleared by the [place] FDA and  has been authorized for detection and/or diagnosis of SARS-CoV-2 by FDA under an Emergency Use Authorization (EUA). This EUA will remain  in effect (meaning this test can be used) for the duration of the COVID-19 declaration under Section 564(b)(1) of the Act, 21 U.S.C.section 360bbb-3(b)(1), unless the authorization is terminated  or revoked sooner.       Influenza A by PCR 08/13/2022 NEGATIVE  NEGATIVE Final   Influenza B by PCR 08/13/2022 NEGATIVE  NEGATIVE Final   Comment: (NOTE) The Xpert Xpress SARS-CoV-2/FLU/RSV plus assay is intended as an aid in the diagnosis of influenza from Nasopharyngeal swab specimens and should not be used as a sole basis for treatment. Nasal washings and aspirates are unacceptable for Xpert Xpress  SARS-CoV-2/FLU/RSV testing.  Fact Sheet for Patients: EntrepreneurPulse.com.au  Fact Sheet for Healthcare Providers: IncredibleEmployment.be  This test is not yet approved or cleared by the [place] FDA and has been authorized for detection and/or diagnosis of SARS-CoV-2 by FDA under an Emergency Use Authorization (EUA). This EUA will remain in effect (meaning this test can be used) for the duration of the COVID-19 declaration under Section 564(b)(1) of the Act, 21 U.S.C. section 360bbb-3(b)(1), unless the authorization is terminated or revoked.  Performed at [facility], [place] [address] [place], [place] [address]    Sodium 08/13/2022 139  135 - 145 mmol/L Final   Potassium 08/13/2022 3.9  3.5 - 5.1 mmol/L Final   Chloride 08/13/2022 105  98 - 111 mmol/L Final   CO2 08/13/2022 22  22 - 32 mmol/L Final   Glucose, Bld 08/13/2022 106 (H)  70 - 99 mg/dL Final   Glucose reference range applies only to samples taken after fasting for at least 8 hours.   BUN 08/13/2022 21 (H)  6 - 20 mg/dL Final   Creatinine, Ser 08/13/2022 1.03  0.61 - 1.24 mg/dL Final   Calcium 08/13/2022 9.9  8.9 - 10.3 mg/dL Final   Total Protein 08/13/2022 8.0  6.5 - 8.1 g/dL Final   Albumin 08/13/2022 4.4  3.5 - 5.0 g/dL Final   AST 08/13/2022 98 (H)  15 - 41 U/L Final   ALT 08/13/2022 32  0 - 44 U/L Final   Alkaline Phosphatase 08/13/2022 54  38 - 126 U/L Final   Total Bilirubin 08/13/2022 0.7  0.3 - 1.2 mg/dL Final   GFR, Estimated 08/13/2022 >60  >60 mL/min Final   Comment: (NOTE) Calculated using the CKD-EPI Creatinine Equation (2021)    Anion gap 08/13/2022 12  5 - 15 Final   Performed at [facility], [place] [address], [place], [place] [address]   Alcohol, Ethyl (B) 08/13/2022 <10  <10 mg/dL Final   Comment: (NOTE) Lowest detectable limit for serum alcohol is 10 mg/dL.  For medical purposes only. Performed at [facility], [place] [address] [place], [place] [address]    WBC 08/13/2022 12.6 (H)  4.0 - 10.5 K/uL Final   RBC 08/13/2022 5.13  4.22 - 5.81 MIL/uL Final   Hemoglobin 08/13/2022 14.5  13.0 - 17.0 g/dL Final   HCT 08/13/2022 41.2  39.0 - 52.0 % Final   MCV 08/13/2022 80.3  80.0 - 100.0 fL Final   MCH 08/13/2022 28.3  26.0 - 34.0 pg Final   MCHC 08/13/2022 35.2  30.0 - 36.0 g/dL Final   RDW 08/13/2022 12.7  11.5 - 15.5 % Final   Platelets 08/13/2022 314  150 - 400 K/uL Final   nRBC 08/13/2022 0.0  0.0 - 0.2 % Final   Neutrophils Relative % 08/13/2022 74  % Final   Neutro Abs 08/13/2022 9.4 (H)  1.7 - 7.7 K/uL Final   Lymphocytes Relative 08/13/2022 14  % Final   Lymphs Abs 08/13/2022 1.7  0.7 - 4.0 K/uL Final   Monocytes Relative 08/13/2022 11  % Final   Monocytes Absolute 08/13/2022 1.3 (H)  0.1 - 1.0 K/uL Final   Eosinophils Relative 08/13/2022 0  % Final   Eosinophils Absolute 08/13/2022 0.1  0.0 - 0.5 K/uL Final   Basophils Relative 08/13/2022 1  % Final   Basophils Absolute 08/13/2022 0.1  0.0 - 0.1 K/uL Final   Immature Granulocytes 08/13/2022 0  % Final   Abs Immature Granulocytes 08/13/2022 0.03  0.00 - 0.07 K/uL Final   Performed at [facility], [place] [address] [place], [place] [address]  Admission on 07/03/2022, Discharged on 07/04/2022  Component Date Value Ref Range Status   Sodium 07/03/2022 142  135 - 145 mmol/L Final   Potassium 07/03/2022 3.2 (L)  3.5 - 5.1 mmol/L Final   Chloride 07/03/2022 110  98 - 111 mmol/L Final   CO2 07/03/2022 24  22 - 32 mmol/L Final   Glucose, Bld  07/03/2022 95  70 - 99 mg/dL Final   Glucose reference range applies only to samples taken after fasting for at least 8 hours.   BUN 07/03/2022 22 (H)  6 - 20 mg/dL Final   Creatinine, Ser 07/03/2022 1.06  0.61 - 1.24 mg/dL Final   Calcium 07/03/2022 8.9  8.9 - 10.3 mg/dL Final   Total Protein 07/03/2022 6.9  6.5 - 8.1 g/dL Final   Albumin 07/03/2022 3.6  3.5 - 5.0 g/dL Final   AST 07/03/2022 31  15 - 41 U/L Final    ALT 07/03/2022 23  0 - 44 U/L Final   Alkaline Phosphatase 07/03/2022 46  38 - 126 U/L Final   Total Bilirubin 07/03/2022 0.8  0.3 - 1.2 mg/dL Final   GFR, Estimated 07/03/2022 >60  >60 mL/min Final   Comment: (NOTE) Calculated using the CKD-EPI Creatinine Equation (2021)    Anion gap 07/03/2022 8  5 - 15 Final   Performed at [facility], [place] [address], [place], [place] [address]   Alcohol, Ethyl (B) 07/03/2022 <10  <10 mg/dL Final   Comment: (NOTE) Lowest detectable limit for serum alcohol is 10 mg/dL.  For medical purposes only. Performed at [facility], [place] [address] [place], [place] [address]    Salicylate Lvl 97/35/3299 <7.0 (L)  7.0 - 30.0 mg/dL Final   Performed at [place] [address], [place], [place] [address]   Acetaminophen (Tylenol), Serum 07/03/2022 <10 (L)  10 - 30 ug/mL Final   Comment: (NOTE) Therapeutic concentrations vary significantly. A range of 10-30 ug/mL  may be an effective concentration for many patients. However, some  are best treated at concentrations outside of this range. Acetaminophen concentrations >150 ug/mL at 4 hours after ingestion  and >50 ug/mL at 12 hours after ingestion are often associated with  toxic reactions.  Performed at [facility], [place] [address], [place], [place] [address]    WBC 07/03/2022 6.0  4.0 - 10.5 K/uL Final   RBC 07/03/2022 4.80  4.22 - 5.81 MIL/uL Final   Hemoglobin 07/03/2022 13.5  13.0 - 17.0 g/dL Final   HCT 07/03/2022 40.6  39.0 - 52.0 % Final   MCV 07/03/2022 84.6  80.0 - 100.0 fL Final   MCH 07/03/2022 28.1  26.0 - 34.0 pg Final   MCHC 07/03/2022 33.3  30.0 - 36.0 g/dL Final   RDW 07/03/2022 13.7  11.5 - 15.5 % Final   Platelets 07/03/2022 309  150 - 400 K/uL Final   nRBC 07/03/2022 0.0  0.0 - 0.2 % Final   Performed at [facility], [place] [address], [place], [place] [address]   Opiates 07/03/2022 NONE  DETECTED  NONE DETECTED Final   Cocaine 07/03/2022 POSITIVE (A)  NONE DETECTED Final   Benzodiazepines 07/03/2022 NONE DETECTED  NONE DETECTED Final   Amphetamines 07/03/2022 NONE DETECTED  NONE DETECTED Final   Tetrahydrocannabinol 07/03/2022 POSITIVE (A)  NONE DETECTED Final   Barbiturates 07/03/2022 NONE DETECTED  NONE DETECTED Final   Comment: (NOTE) DRUG SCREEN FOR MEDICAL PURPOSES ONLY.  IF CONFIRMATION IS NEEDED FOR ANY PURPOSE, NOTIFY LAB WITHIN 5 DAYS.  LOWEST DETECTABLE LIMITS FOR URINE DRUG SCREEN Drug Class                     Cutoff (ng/mL) Amphetamine and metabolites    1000 Barbiturate and metabolites    200 Benzodiazepine  756 Tricyclics and metabolites     300 Opiates and metabolites        300 Cocaine and metabolites        300 THC                            50 Performed at [facility], [place] [address] [place], [place] [address]   Admission on 06/28/2022, Discharged on 06/29/2022  Component Date Value Ref Range Status   Acetaminophen (Tylenol), Serum 06/28/2022 <10 (L)  10 - 30 ug/mL Final   Comment: (NOTE) Therapeutic concentrations vary significantly. A range of 10-30 ug/mL  may be an effective concentration for many patients. However, some  are best treated at concentrations outside of this range. Acetaminophen concentrations >150 ug/mL at 4 hours after ingestion  and >50 ug/mL at 12 hours after ingestion are often associated with  toxic reactions.  Performed at [facility], [place] [address], [place], [place] [address]    Sodium 06/28/2022 140  135 - 145 mmol/L Final   Potassium 06/28/2022 3.3 (L)  3.5 - 5.1 mmol/L Final   Chloride 06/28/2022 106  98 - 111 mmol/L Final   CO2 06/28/2022 27  22 - 32 mmol/L Final   Glucose, Bld 06/28/2022 113 (H)  70 - 99 mg/dL Final   Glucose reference range applies only to samples taken after fasting for at least 8 hours.   BUN 06/28/2022 25 (H)  6 - 20 mg/dL Final    Creatinine, Ser 06/28/2022 0.83  0.61 - 1.24 mg/dL Final   Calcium 06/28/2022 8.8 (L)  8.9 - 10.3 mg/dL Final   Total Protein 06/28/2022 6.5  6.5 - 8.1 g/dL Final   Albumin 06/28/2022 3.8  3.5 - 5.0 g/dL Final   AST 06/28/2022 35  15 - 41 U/L Final   ALT 06/28/2022 22  0 - 44 U/L Final   Alkaline Phosphatase 06/28/2022 44  38 - 126 U/L Final   Total Bilirubin 06/28/2022 0.6  0.3 - 1.2 mg/dL Final   GFR, Estimated 06/28/2022 >60  >60 mL/min Final   Comment: (NOTE) Calculated using the CKD-EPI Creatinine Equation (2021)    Anion gap 06/28/2022 7  5 - 15 Final   Performed at [facility], [place] [address], [place], [place] [address]   Alcohol, Ethyl (B) 06/28/2022 <10  <10 mg/dL Final   Comment: (NOTE) Lowest detectable limit for serum alcohol is 10 mg/dL.  For medical purposes only. Performed at [facility], [place] [address], [place], [place] [address]    Opiates 06/28/2022 NONE DETECTED  NONE DETECTED Final   Cocaine 06/28/2022 POSITIVE (A)  NONE DETECTED Final   Benzodiazepines 06/28/2022 NONE DETECTED  NONE DETECTED Final   Amphetamines 06/28/2022 NONE DETECTED  NONE DETECTED Final   Tetrahydrocannabinol 06/28/2022 POSITIVE (A)  NONE DETECTED Final   Barbiturates 06/28/2022 NONE DETECTED  NONE DETECTED Final   Comment: (NOTE) DRUG SCREEN FOR MEDICAL PURPOSES ONLY.  IF CONFIRMATION IS NEEDED FOR ANY PURPOSE, NOTIFY LAB WITHIN 5 DAYS.  LOWEST DETECTABLE LIMITS FOR URINE DRUG SCREEN Drug Class                     Cutoff (ng/mL) Amphetamine and metabolites    1000 Barbiturate and metabolites    200 Benzodiazepine                 109 Tricyclics and metabolites  300 Opiates and metabolites        300 Cocaine and metabolites        300 THC                            50 Performed at [place] [address], [place], [place] [address]    Salicylate Lvl 71/69/6789 <7.0 (L)  7.0 - 30.0 mg/dL Final   Performed at  [place] [address] [place], [place] [address]   WBC 06/28/2022 5.7  4.0 - 10.5 K/uL Final   RBC 06/28/2022 4.61  4.22 - 5.81 MIL/uL Final   Hemoglobin 06/28/2022 13.0  13.0 - 17.0 g/dL Final   HCT 06/28/2022 38.7 (L)  39.0 - 52.0 % Final   MCV 06/28/2022 83.9  80.0 - 100.0 fL Final   MCH 06/28/2022 28.2  26.0 - 34.0 pg Final   MCHC 06/28/2022 33.6  30.0 - 36.0 g/dL Final   RDW 06/28/2022 13.9  11.5 - 15.5 % Final   Platelets 06/28/2022 298  150 - 400 K/uL Final   nRBC 06/28/2022 0.0  0.0 - 0.2 % Final   Neutrophils Relative % 06/28/2022 56  % Final   Neutro Abs 06/28/2022 3.3  1.7 - 7.7 K/uL Final   Lymphocytes Relative 06/28/2022 28  % Final   Lymphs Abs 06/28/2022 1.6  0.7 - 4.0 K/uL Final   Monocytes Relative 06/28/2022 9  % Final   Monocytes Absolute 06/28/2022 0.5  0.1 - 1.0 K/uL Final   Eosinophils Relative 06/28/2022 5  % Final   Eosinophils Absolute 06/28/2022 0.3  0.0 - 0.5 K/uL Final   Basophils Relative 06/28/2022 1  % Final   Basophils Absolute 06/28/2022 0.1  0.0 - 0.1 K/uL Final   Immature Granulocytes 06/28/2022 1  % Final   Abs Immature Granulocytes 06/28/2022 0.04  0.00 - 0.07 K/uL Final   Performed at [facility], [place] [address] [place], [place] [address]   Glucose-Capillary 06/28/2022 122 (H)  70 - 99 mg/dL Final   Glucose reference range applies only to samples taken after fasting for at least 8 hours.   SARS Coronavirus 2 by RT PCR 06/28/2022 NEGATIVE  NEGATIVE Final   Comment: (NOTE) SARS-CoV-2 target nucleic acids are NOT DETECTED.  The SARS-CoV-2 RNA is generally detectable in upper respiratory specimens during the acute phase of infection. The lowest concentration of SARS-CoV-2 viral copies this assay can detect is 138 copies/mL. A negative result does not preclude SARS-Cov-2 infection and should not be used as the sole basis for treatment or other patient management decisions. A negative result may occur  with  improper specimen collection/handling, submission of specimen other than nasopharyngeal swab, presence of viral mutation(s) within the areas targeted by this assay, and inadequate number of viral copies(<138 copies/mL). A negative result must be combined with clinical observations, patient history, and epidemiological information. The expected result is Negative.  Fact Sheet for Patients:  EntrepreneurPulse.com.au  Fact Sheet for Healthcare Providers:  IncredibleEmployment.be  This test is no                          t yet approved or cleared by the [place] FDA and  has been authorized for detection and/or diagnosis of SARS-CoV-2 by FDA under an Emergency Use Authorization (EUA). This EUA will remain  in effect (meaning this test can be used) for  the duration of the COVID-19 declaration under Section 564(b)(1) of the Act, 21 U.S.C.section 360bbb-3(b)(1), unless the authorization is terminated  or revoked sooner.       Influenza A by PCR 06/28/2022 NEGATIVE  NEGATIVE Final   Influenza B by PCR 06/28/2022 NEGATIVE  NEGATIVE Final   Comment: (NOTE) The Xpert Xpress SARS-CoV-2/FLU/RSV plus assay is intended as an aid in the diagnosis of influenza from Nasopharyngeal swab specimens and should not be used as a sole basis for treatment. Nasal washings and aspirates are unacceptable for Xpert Xpress SARS-CoV-2/FLU/RSV testing.  Fact Sheet for Patients: EntrepreneurPulse.com.au  Fact Sheet for Healthcare Providers: IncredibleEmployment.be  This test is not yet approved or cleared by the [place] FDA and has been authorized for detection and/or diagnosis of SARS-CoV-2 by FDA under an Emergency Use Authorization (EUA). This EUA will remain in effect (meaning this test can be used) for the duration of the COVID-19 declaration under Section 564(b)(1) of the Act, 21 U.S.C. section 360bbb-3(b)(1),  unless the authorization is terminated or revoked.  Performed at [facility], [place] [address] [place], [place] [address]   Admission on 06/15/2022, Discharged on 06/16/2022  Component Date Value Ref Range Status   Sodium 06/15/2022 140  135 - 145 mmol/L Final   Potassium 06/15/2022 3.6  3.5 - 5.1 mmol/L Final   Chloride 06/15/2022 109  98 - 111 mmol/L Final   CO2 06/15/2022 27  22 - 32 mmol/L Final   Glucose, Bld 06/15/2022 74  70 - 99 mg/dL Final   Glucose reference range applies only to samples taken after fasting for at least 8 hours.   BUN 06/15/2022 17  6 - 20 mg/dL Final   Creatinine, Ser 06/15/2022 1.00  0.61 - 1.24 mg/dL Final   Calcium 06/15/2022 9.2  8.9 - 10.3 mg/dL Final   Total Protein 06/15/2022 6.9  6.5 - 8.1 g/dL Final   Albumin 06/15/2022 4.0  3.5 - 5.0 g/dL Final   AST 06/15/2022 29  15 - 41 U/L Final   ALT 06/15/2022 24  0 - 44 U/L Final   Alkaline Phosphatase 06/15/2022 48  38 - 126 U/L Final   Total Bilirubin 06/15/2022 0.5  0.3 - 1.2 mg/dL Final   GFR, Estimated 06/15/2022 >60  >60 mL/min Final   Comment: (NOTE) Calculated using the CKD-EPI Creatinine Equation (2021)    Anion gap 06/15/2022 4 (L)  5 - 15 Final   Performed at [facility], [place] [address], [place], [place] [address]   Alcohol, Ethyl (B) 06/15/2022 <10  <10 mg/dL Final   Comment: (NOTE) Lowest detectable limit for serum alcohol is 10 mg/dL.  For medical purposes only. Performed at [facility], [place] [address] [place], [place] [address]    Salicylate Lvl 93/73/4287 <7.0 (L)  7.0 - 30.0 mg/dL Final   Performed at [place] [address], [place], [place] [address]   Acetaminophen (Tylenol), Serum 06/15/2022 <10 (L)  10 - 30 ug/mL Final   Comment: (NOTE) Therapeutic concentrations vary significantly. A range of 10-30 ug/mL  may be an effective concentration for many patients. However, some  are best treated at  concentrations outside of this range. Acetaminophen concentrations >150 ug/mL at 4 hours after ingestion  and >50 ug/mL at 12 hours after ingestion are often associated with  toxic reactions.  Performed at [facility], [place] [address] [place], [place] [address]    WBC 06/15/2022 7.9  4.0 -  10.5 K/uL Final   RBC 06/15/2022 4.62  4.22 - 5.81 MIL/uL Final   Hemoglobin 06/15/2022 13.2  13.0 - 17.0 g/dL Final   HCT 06/15/2022 39.8  39.0 - 52.0 % Final   MCV 06/15/2022 86.1  80.0 - 100.0 fL Final   MCH 06/15/2022 28.6  26.0 - 34.0 pg Final   MCHC 06/15/2022 33.2  30.0 - 36.0 g/dL Final   RDW 06/15/2022 13.2  11.5 - 15.5 % Final   Platelets 06/15/2022 268  150 - 400 K/uL Final   nRBC 06/15/2022 0.0  0.0 - 0.2 % Final   Performed at [facility], [place] [address] [place], [place] [address]   Opiates 06/15/2022 NONE DETECTED  NONE DETECTED Final   Cocaine 06/15/2022 POSITIVE (A)  NONE DETECTED Final   Benzodiazepines 06/15/2022 NONE DETECTED  NONE DETECTED Final   Amphetamines 06/15/2022 POSITIVE (A)  NONE DETECTED Final   Tetrahydrocannabinol 06/15/2022 POSITIVE (A)  NONE DETECTED Final   Barbiturates 06/15/2022 NONE DETECTED  NONE DETECTED Final   Comment: (NOTE) DRUG SCREEN FOR MEDICAL PURPOSES ONLY.  IF CONFIRMATION IS NEEDED FOR ANY PURPOSE, NOTIFY LAB WITHIN 5 DAYS.  LOWEST DETECTABLE LIMITS FOR URINE DRUG SCREEN Drug Class                     Cutoff (ng/mL) Amphetamine and metabolites    1000 Barbiturate and metabolites    200 Benzodiazepine                 017 Tricyclics and metabolites     300 Opiates and metabolites        300 Cocaine and metabolites        300 THC                            50 Performed at [facility], [place] [address], [place], [place] [address]    SARS Coronavirus 2 by RT PCR 06/15/2022 NEGATIVE  NEGATIVE Final   Comment: (NOTE) SARS-CoV-2 target nucleic acids are NOT DETECTED.  The SARS-CoV-2  RNA is generally detectable in upper respiratory specimens during the acute phase of infection. The lowest concentration of SARS-CoV-2 viral copies this assay can detect is 138 copies/mL. A negative result does not preclude SARS-Cov-2 infection and should not be used as the sole basis for treatment or other patient management decisions. A negative result may occur with  improper specimen collection/handling, submission of specimen other than nasopharyngeal swab, presence of viral mutation(s) within the areas targeted by this assay, and inadequate number of viral copies(<138 copies/mL). A negative result must be combined with clinical observations, patient history, and epidemiological information. The expected result is Negative.  Fact Sheet for Patients:  EntrepreneurPulse.com.au  Fact Sheet for Healthcare Providers:  IncredibleEmployment.be  This test is no                          t yet approved or cleared by the [place] FDA and  has been authorized for detection and/or diagnosis of SARS-CoV-2 by FDA under an Emergency Use Authorization (EUA). This EUA will remain  in effect (meaning this test can be used) for the duration of the COVID-19 declaration under Section 564(b)(1) of the Act, 21 U.S.C.section 360bbb-3(b)(1), unless the authorization is terminated  or revoked sooner.       Influenza A by PCR 06/15/2022 NEGATIVE  NEGATIVE Final  Influenza B by PCR 06/15/2022 NEGATIVE  NEGATIVE Final   Comment: (NOTE) The Xpert Xpress SARS-CoV-2/FLU/RSV plus assay is intended as an aid in the diagnosis of influenza from Nasopharyngeal swab specimens and should not be used as a sole basis for treatment. Nasal washings and aspirates are unacceptable for Xpert Xpress SARS-CoV-2/FLU/RSV testing.  Fact Sheet for Patients: EntrepreneurPulse.com.au  Fact Sheet for Healthcare  Providers: IncredibleEmployment.be  This test is not yet approved or cleared by the [place] FDA and has been authorized for detection and/or diagnosis of SARS-CoV-2 by FDA under an Emergency Use Authorization (EUA). This EUA will remain in effect (meaning this test can be used) for the duration of the COVID-19 declaration under Section 564(b)(1) of the Act, 21 U.S.C. section 360bbb-3(b)(1), unless the authorization is terminated or revoked.  Performed at [facility], [place] [address], [place], [place] [address]   Admission on 06/11/2022, Discharged on 06/12/2022  Component Date Value Ref Range Status   SARS Coronavirus 2 by RT PCR 06/12/2022 NEGATIVE  NEGATIVE Final   Comment: (NOTE) SARS-CoV-2 target nucleic acids are NOT DETECTED.  The SARS-CoV-2 RNA is generally detectable in upper respiratory specimens during the acute phase of infection. The lowest concentration of SARS-CoV-2 viral copies this assay can detect is 138 copies/mL. A negative result does not preclude SARS-Cov-2 infection and should not be used as the sole basis for treatment or other patient management decisions. A negative result may occur with  improper specimen collection/handling, submission of specimen other than nasopharyngeal swab, presence of viral mutation(s) within the areas targeted by this assay, and inadequate number of viral copies(<138 copies/mL). A negative result must be combined with clinical observations, patient history, and epidemiological information. The expected result is Negative.  Fact Sheet for Patients:  EntrepreneurPulse.com.au  Fact Sheet for Healthcare Providers:  IncredibleEmployment.be  This test is no                          t yet approved or cleared by the [place] FDA and  has been authorized for detection and/or diagnosis of SARS-CoV-2 by FDA under an Emergency Use Authorization (EUA).  This EUA will remain  in effect (meaning this test can be used) for the duration of the COVID-19 declaration under Section 564(b)(1) of the Act, 21 U.S.C.section 360bbb-3(b)(1), unless the authorization is terminated  or revoked sooner.       Influenza A by PCR 06/12/2022 NEGATIVE  NEGATIVE Final   Influenza B by PCR 06/12/2022 NEGATIVE  NEGATIVE Final   Comment: (NOTE) The Xpert Xpress SARS-CoV-2/FLU/RSV plus assay is intended as an aid in the diagnosis of influenza from Nasopharyngeal swab specimens and should not be used as a sole basis for treatment. Nasal washings and aspirates are unacceptable for Xpert Xpress SARS-CoV-2/FLU/RSV testing.  Fact Sheet for Patients: EntrepreneurPulse.com.au  Fact Sheet for Healthcare Providers: IncredibleEmployment.be  This test is not yet approved or cleared by the [place] FDA and has been authorized for detection and/or diagnosis of SARS-CoV-2 by FDA under an Emergency Use Authorization (EUA). This EUA will remain in effect (meaning this test can be used) for the duration of the COVID-19 declaration under Section 564(b)(1) of the Act, 21 U.S.C. section 360bbb-3(b)(1), unless the authorization is terminated or revoked.  Performed at [facility], [place] [address] [place], [place] [address]    Sodium 06/12/2022 139  135 - 145 mmol/L Final   Potassium 06/12/2022 3.5  3.5 - 5.1  mmol/L Final   Chloride 06/12/2022 102  98 - 111 mmol/L Final   CO2 06/12/2022 25  22 - 32 mmol/L Final   Glucose, Bld 06/12/2022 93  70 - 99 mg/dL Final   Glucose reference range applies only to samples taken after fasting for at least 8 hours.   BUN 06/12/2022 19  6 - 20 mg/dL Final   Creatinine, Ser 06/12/2022 1.10  0.61 - 1.24 mg/dL Final   Calcium 06/12/2022 9.7  8.9 - 10.3 mg/dL Final   Total Protein 06/12/2022 7.0  6.5 - 8.1 g/dL Final   Albumin 06/12/2022 4.1  3.5 - 5.0 g/dL Final   AST 06/12/2022 52 (H)  15 -  41 U/L Final   ALT 06/12/2022 31  0 - 44 U/L Final   Alkaline Phosphatase 06/12/2022 44  38 - 126 U/L Final   Total Bilirubin 06/12/2022 1.2  0.3 - 1.2 mg/dL Final   GFR, Estimated 06/12/2022 >60  >60 mL/min Final   Comment: (NOTE) Calculated using the CKD-EPI Creatinine Equation (2021)    Anion gap 06/12/2022 12  5 - 15 Final   Performed at [place] [address], [place], [place] [address]   Alcohol, Ethyl (B) 06/12/2022 <10  <10 mg/dL Final   Comment: (NOTE) Lowest detectable limit for serum alcohol is 10 mg/dL.  For medical purposes only. Performed at [facility], [place] [address] [place], [place] [address]    Opiates 06/12/2022 NONE DETECTED  NONE DETECTED Final   Cocaine 06/12/2022 POSITIVE (A)  NONE DETECTED Final   Benzodiazepines 06/12/2022 NONE DETECTED  NONE DETECTED Final   Amphetamines 06/12/2022 POSITIVE (A)  NONE DETECTED Final   Tetrahydrocannabinol 06/12/2022 POSITIVE (A)  NONE DETECTED Final   Barbiturates 06/12/2022 NONE DETECTED  NONE DETECTED Final   Comment: (NOTE) DRUG SCREEN FOR MEDICAL PURPOSES ONLY.  IF CONFIRMATION IS NEEDED FOR ANY PURPOSE, NOTIFY LAB WITHIN 5 DAYS.  LOWEST DETECTABLE LIMITS FOR URINE DRUG SCREEN Drug Class                     Cutoff (ng/mL) Amphetamine and metabolites    1000 Barbiturate and metabolites    200 Benzodiazepine                 347 Tricyclics and metabolites     300 Opiates and metabolites        300 Cocaine and metabolites        300 THC                            50 Performed at [facility], [place] [address] [place], [place] [address]    WBC 06/12/2022 13.0 (H)  4.0 - 10.5 K/uL Final   RBC 06/12/2022 4.68  4.22 - 5.81 MIL/uL Final   Hemoglobin 06/12/2022 13.6  13.0 - 17.0 g/dL Final   HCT 06/12/2022 39.1  39.0 - 52.0 % Final   MCV 06/12/2022 83.5  80.0 - 100.0 fL Final   MCH 06/12/2022 29.1  26.0 - 34.0 pg Final   MCHC 06/12/2022 34.8  30.0 - 36.0 g/dL Final   RDW 06/12/2022 13.3   11.5 - 15.5 % Final   Platelets 06/12/2022 267  150 - 400 K/uL Final   nRBC 06/12/2022 0.0  0.0 - 0.2 % Final   Neutrophils Relative % 06/12/2022 80  % Final   Neutro Abs 06/12/2022 10.4 (H)  1.7 -  7.7 K/uL Final   Lymphocytes Relative 06/12/2022 11  % Final   Lymphs Abs 06/12/2022 1.4  0.7 - 4.0 K/uL Final   Monocytes Relative 06/12/2022 8  % Final   Monocytes Absolute 06/12/2022 1.1 (H)  0.1 - 1.0 K/uL Final   Eosinophils Relative 06/12/2022 0  % Final   Eosinophils Absolute 06/12/2022 0.0  0.0 - 0.5 K/uL Final   Basophils Relative 06/12/2022 1  % Final   Basophils Absolute 06/12/2022 0.1  0.0 - 0.1 K/uL Final   Immature Granulocytes 06/12/2022 0  % Final   Abs Immature Granulocytes 06/12/2022 0.05  0.00 - 0.07 K/uL Final   Performed at [facility] [address] [place], [place] [address]  Admission on 06/05/2022, Discharged on 06/07/2022  Component Date Value Ref Range Status   SARS Coronavirus 2 by RT PCR 06/05/2022 NEGATIVE  NEGATIVE Final   Comment: (NOTE) SARS-CoV-2 target nucleic acids are NOT DETECTED.  The SARS-CoV-2 RNA is generally detectable in upper respiratory specimens during the acute phase of infection. The lowest concentration of SARS-CoV-2 viral copies this assay can detect is 138 copies/mL. A negative result does not preclude SARS-Cov-2 infection and should not be used as the sole basis for treatment or other patient management decisions. A negative result may occur with  improper specimen collection/handling, submission of specimen other than nasopharyngeal swab, presence of viral mutation(s) within the areas targeted by this assay, and inadequate number of viral copies(<138 copies/mL). A negative result must be combined with clinical observations, patient history, and epidemiological information. The expected result is Negative.  Fact Sheet for Patients:  EntrepreneurPulse.com.au  Fact Sheet for Healthcare Providers:   IncredibleEmployment.be  This test is no                          t yet approved or cleared by the [place] FDA and  has been authorized for detection and/or diagnosis of SARS-CoV-2 by FDA under an Emergency Use Authorization (EUA). This EUA will remain  in effect (meaning this test can be used) for the duration of the COVID-19 declaration under Section 564(b)(1) of the Act, 21 U.S.C.section 360bbb-3(b)(1), unless the authorization is terminated  or revoked sooner.       Influenza A by PCR 06/05/2022 NEGATIVE  NEGATIVE Final   Influenza B by PCR 06/05/2022 NEGATIVE  NEGATIVE Final   Comment: (NOTE) The Xpert Xpress SARS-CoV-2/FLU/RSV plus assay is intended as an aid in the diagnosis of influenza from Nasopharyngeal swab specimens and should not be used as a sole basis for treatment. Nasal washings and aspirates are unacceptable for Xpert Xpress SARS-CoV-2/FLU/RSV testing.  Fact Sheet for Patients: EntrepreneurPulse.com.au  Fact Sheet for Healthcare Providers: IncredibleEmployment.be  This test is not yet approved or cleared by the [place] FDA and has been authorized for detection and/or diagnosis of SARS-CoV-2 by FDA under an Emergency Use Authorization (EUA). This EUA will remain in effect (meaning this test can be used) for the duration of the COVID-19 declaration under Section 564(b)(1) of the Act, 21 U.S.C. section 360bbb-3(b)(1), unless the authorization is terminated or revoked.  Performed at [facility], [place] [address], [place], [place] [address]    WBC 06/05/2022 9.6  4.0 - 10.5 K/uL Final   RBC 06/05/2022 5.08  4.22 - 5.81 MIL/uL Final   Hemoglobin 06/05/2022 14.5  13.0 - 17.0 g/dL Final   HCT 06/05/2022 42.3  39.0 - 52.0 % Final   MCV  06/05/2022 83.3  80.0 - 100.0 fL Final   MCH 06/05/2022 28.5  26.0 - 34.0 pg Final   MCHC 06/05/2022 34.3  30.0 - 36.0 g/dL Final   RDW 06/05/2022 13.6  11.5  - 15.5 % Final   Platelets 06/05/2022 323  150 - 400 K/uL Final   nRBC 06/05/2022 0.0  0.0 - 0.2 % Final   Neutrophils Relative % 06/05/2022 67  % Final   Neutro Abs 06/05/2022 6.5  1.7 - 7.7 K/uL Final   Lymphocytes Relative 06/05/2022 23  % Final   Lymphs Abs 06/05/2022 2.2  0.7 - 4.0 K/uL Final   Monocytes Relative 06/05/2022 7  % Final   Monocytes Absolute 06/05/2022 0.7  0.1 - 1.0 K/uL Final   Eosinophils Relative 06/05/2022 2  % Final   Eosinophils Absolute 06/05/2022 0.2  0.0 - 0.5 K/uL Final   Basophils Relative 06/05/2022 1  % Final   Basophils Absolute 06/05/2022 0.1  0.0 - 0.1 K/uL Final   Immature Granulocytes 06/05/2022 0  % Final   Abs Immature Granulocytes 06/05/2022 0.02  0.00 - 0.07 K/uL Final   Performed at [facility], [place] [address], [place], [place] [address]   Sodium 06/05/2022 136  135 - 145 mmol/L Final   Potassium 06/05/2022 3.6  3.5 - 5.1 mmol/L Final   Chloride 06/05/2022 100  98 - 111 mmol/L Final   CO2 06/05/2022 27  22 - 32 mmol/L Final   Glucose, Bld 06/05/2022 90  70 - 99 mg/dL Final   Glucose reference range applies only to samples taken after fasting for at least 8 hours.   BUN 06/05/2022 15  6 - 20 mg/dL Final   Creatinine, Ser 06/05/2022 1.01  0.61 - 1.24 mg/dL Final   Calcium 06/05/2022 9.6  8.9 - 10.3 mg/dL Final   Total Protein 06/05/2022 6.3 (L)  6.5 - 8.1 g/dL Final   Albumin 06/05/2022 3.8  3.5 - 5.0 g/dL Final   AST 06/05/2022 41  15 - 41 U/L Final   ALT 06/05/2022 28  0 - 44 U/L Final   Alkaline Phosphatase 06/05/2022 48  38 - 126 U/L Final   Total Bilirubin 06/05/2022 0.5  0.3 - 1.2 mg/dL Final   GFR, Estimated 06/05/2022 >60  >60 mL/min Final   Comment: (NOTE) Calculated using the CKD-EPI Creatinine Equation (2021)    Anion gap 06/05/2022 9  5 - 15 Final   Performed at [place] [address] [place], [place] [address]   Alcohol, Ethyl (B) 06/05/2022 <10  <10 mg/dL Final   Comment: (NOTE) Lowest detectable limit  for serum alcohol is 10 mg/dL.  For medical purposes only. Performed at [facility], [place] [address], [place], [place] [address]    Color, Urine 06/05/2022 YELLOW  YELLOW Final   APPearance 06/05/2022 CLEAR  CLEAR Final   Specific Gravity, Urine 06/05/2022 1.012  1.005 - 1.030 Final   pH 06/05/2022 6.0  5.0 - 8.0 Final   Glucose, UA 06/05/2022 NEGATIVE  NEGATIVE mg/dL Final   Hgb urine dipstick 06/05/2022 NEGATIVE  NEGATIVE Final   Bilirubin Urine 06/05/2022 NEGATIVE  NEGATIVE Final   Ketones, ur 06/05/2022 NEGATIVE  NEGATIVE mg/dL Final   Protein, ur 06/05/2022 NEGATIVE  NEGATIVE mg/dL Final   Nitrite 06/05/2022 NEGATIVE  NEGATIVE Final   Leukocytes,Ua 06/05/2022 SMALL (A)  NEGATIVE Final   RBC / HPF 06/05/2022 0-5  0 - 5 RBC/hpf Final   WBC, UA 06/05/2022 11-20  0 - 5 WBC/hpf Final   Bacteria, UA 06/05/2022 FEW (A)  NONE SEEN Final   Squamous Epithelial / LPF 06/05/2022 0-5  0 - 5 Final   Mucus 06/05/2022 PRESENT   Final   Performed at [facility], [place] [address], [place], [place] [address]   POC Amphetamine UR 06/05/2022 None Detected  NONE DETECTED (Cut Off Level 1000 ng/mL) Final   POC Secobarbital (BAR) 06/05/2022 None Detected  NONE DETECTED (Cut Off Level 300 ng/mL) Final   POC Buprenorphine (BUP) 06/05/2022 None Detected  NONE DETECTED (Cut Off Level 10 ng/mL) Final   POC Oxazepam (BZO) 06/05/2022 None Detected  NONE DETECTED (Cut Off Level 300 ng/mL) Final   POC Cocaine UR 06/05/2022 Positive (A)  NONE DETECTED (Cut Off Level 300 ng/mL) Final   POC Methamphetamine UR 06/05/2022 None Detected  NONE DETECTED (Cut Off Level 1000 ng/mL) Final   POC Morphine 06/05/2022 None Detected  NONE DETECTED (Cut Off Level 300 ng/mL) Final   POC Methadone UR 06/05/2022 None Detected  NONE DETECTED (Cut Off Level 300 ng/mL) Final   POC Oxycodone UR 06/05/2022 Positive (A)  NONE DETECTED (Cut Off Level 100 ng/mL) Final   POC Marijuana UR 06/05/2022 Positive (A)  NONE DETECTED  (Cut Off Level 50 ng/mL) Final  Admission on 05/30/2022, Discharged on 05/30/2022  Component Date Value Ref Range Status   SARS Coronavirus 2 by RT PCR 05/30/2022 NEGATIVE  NEGATIVE Final   Comment: (NOTE) SARS-CoV-2 target nucleic acids are NOT DETECTED.  The SARS-CoV-2 RNA is generally detectable in upper respiratory specimens during the acute phase of infection. The lowest concentration of SARS-CoV-2 viral copies this assay can detect is 138 copies/mL. A negative result does not preclude SARS-Cov-2 infection and should not be used as the sole basis for treatment or other patient management decisions. A negative result may occur with  improper specimen collection/handling, submission of specimen other than nasopharyngeal swab, presence of viral mutation(s) within the areas targeted by this assay, and inadequate number of viral copies(<138 copies/mL). A negative result must be combined with clinical observations, patient history, and epidemiological information. The expected result is Negative.  Fact Sheet for Patients:  EntrepreneurPulse.com.au  Fact Sheet for Healthcare Providers:  IncredibleEmployment.be  This test is no                          t yet approved or cleared by the [place] FDA and  has been authorized for detection and/or diagnosis of SARS-CoV-2 by FDA under an Emergency Use Authorization (EUA). This EUA will remain  in effect (meaning this test can be used) for the duration of the COVID-19 declaration under Section 564(b)(1) of the Act, 21 U.S.C.section 360bbb-3(b)(1), unless the authorization is terminated  or revoked sooner.       Influenza A by PCR 05/30/2022 NEGATIVE  NEGATIVE Final   Influenza B by PCR 05/30/2022 NEGATIVE  NEGATIVE Final   Comment: (NOTE) The Xpert Xpress SARS-CoV-2/FLU/RSV plus assay is intended as an aid in the diagnosis of influenza from Nasopharyngeal swab specimens and should not be used as  a sole basis for treatment. Nasal washings and aspirates are unacceptable for Xpert Xpress SARS-CoV-2/FLU/RSV testing.  Fact Sheet for Patients: EntrepreneurPulse.com.au  Fact Sheet for Healthcare Providers: IncredibleEmployment.be  This test is not yet approved or cleared by the [place] FDA and has been authorized for detection and/or diagnosis of SARS-CoV-2 by FDA under an Emergency Use Authorization (EUA). This  EUA will remain in effect (meaning this test can be used) for the duration of the COVID-19 declaration under Section 564(b)(1) of the Act, 21 U.S.C. section 360bbb-3(b)(1), unless the authorization is terminated or revoked.  Performed at [facility], [place] [address] [place], [place] [address]    WBC 05/30/2022 7.8  4.0 - 10.5 K/uL Final   RBC 05/30/2022 4.90  4.22 - 5.81 MIL/uL Final   Hemoglobin 05/30/2022 13.7  13.0 - 17.0 g/dL Final   HCT 05/30/2022 40.9  39.0 - 52.0 % Final   MCV 05/30/2022 83.5  80.0 - 100.0 fL Final   MCH 05/30/2022 28.0  26.0 - 34.0 pg Final   MCHC 05/30/2022 33.5  30.0 - 36.0 g/dL Final   RDW 05/30/2022 13.7  11.5 - 15.5 % Final   Platelets 05/30/2022 313  150 - 400 K/uL Final   nRBC 05/30/2022 0.0  0.0 - 0.2 % Final   Neutrophils Relative % 05/30/2022 66  % Final   Neutro Abs 05/30/2022 5.2  1.7 - 7.7 K/uL Final   Lymphocytes Relative 05/30/2022 25  % Final   Lymphs Abs 05/30/2022 1.9  0.7 - 4.0 K/uL Final   Monocytes Relative 05/30/2022 6  % Final   Monocytes Absolute 05/30/2022 0.5  0.1 - 1.0 K/uL Final   Eosinophils Relative 05/30/2022 2  % Final   Eosinophils Absolute 05/30/2022 0.2  0.0 - 0.5 K/uL Final   Basophils Relative 05/30/2022 1  % Final   Basophils Absolute 05/30/2022 0.1  0.0 - 0.1 K/uL Final   Immature Granulocytes 05/30/2022 0  % Final   Abs Immature Granulocytes 05/30/2022 0.01  0.00 - 0.07 K/uL Final   Performed at [facility], [place] [address] [place], [place]  [address]   Sodium 05/30/2022 139  135 - 145 mmol/L Final   Potassium 05/30/2022 3.6  3.5 - 5.1 mmol/L Final   Chloride 05/30/2022 106  98 - 111 mmol/L Final   CO2 05/30/2022 26  22 - 32 mmol/L Final   Glucose, Bld 05/30/2022 98  70 - 99 mg/dL Final   Glucose reference range applies only to samples taken after fasting for at least 8 hours.   BUN 05/30/2022 13  6 - 20 mg/dL Final   Creatinine, Ser 05/30/2022 1.00  0.61 - 1.24 mg/dL Final   Calcium 05/30/2022 9.4  8.9 - 10.3 mg/dL Final   Total Protein 05/30/2022 7.0  6.5 - 8.1 g/dL Final   Albumin 05/30/2022 4.1  3.5 - 5.0 g/dL Final   AST 05/30/2022 36  15 - 41 U/L Final   ALT 05/30/2022 25  0 - 44 U/L Final   Alkaline Phosphatase 05/30/2022 46  38 - 126 U/L Final   Total Bilirubin 05/30/2022 0.7  0.3 - 1.2 mg/dL Final   GFR, Estimated 05/30/2022 >60  >60 mL/min Final   Comment: (NOTE) Calculated using the CKD-EPI Creatinine Equation (2021)    Anion gap 05/30/2022 7  5 - 15 Final   Performed at [facility] [address], [place], [place] [address]   Hgb A1c MFr Bld 05/30/2022 5.2  4.8 - 5.6 % Final   Comment: (NOTE) Pre diabetes:          5.7%-6.4%  Diabetes:              >6.4%  Glycemic control for   <7.0% adults with diabetes    Mean Plasma Glucose 05/30/2022 102.54  mg/dL Final   Performed at [facility], [place] [address], [place], [place] [address]   Cholesterol 05/30/2022 188  0 - 200 mg/dL Final   Triglycerides 05/30/2022 48  <150 mg/dL Final   HDL 05/30/2022 88  >40 mg/dL Final   Total CHOL/HDL Ratio 05/30/2022 2.1  RATIO Final   VLDL 05/30/2022 10  0 - 40 mg/dL Final   LDL Cholesterol 05/30/2022 90  0 - 99 mg/dL Final   Comment:        Total Cholesterol/HDL:CHD Risk Coronary Heart Disease Risk Table                     Men   Women  1/2 Average Risk   3.4   3.3  Average Risk       5.0   4.4  2 X Average Risk   9.6   7.1  3 X Average Risk  23.4   11.0        Use the calculated Patient Ratio above and  the CHD Risk Table to determine the patient's CHD Risk.        ATP III CLASSIFICATION (LDL):  <100     mg/dL   Optimal  100-129  mg/dL   Near or Above                    Optimal  130-159  mg/dL   Borderline  160-189  mg/dL   High  >190     mg/dL   Very High Performed at [facility] [address] [place], [place] [address]    TSH 05/30/2022 0.753  0.350 - 4.500 uIU/mL Final   Comment: Performed by a 3rd Generation assay with a functional sensitivity of <=0.01 uIU/mL. Performed at [facility], [place] [address] [place], [place] [address]    POC Amphetamine UR 05/30/2022 Positive (A)  NONE DETECTED (Cut Off Level 1000 ng/mL) Final   POC Secobarbital (BAR) 05/30/2022 None Detected  NONE DETECTED (Cut Off Level 300 ng/mL) Final   POC Buprenorphine (BUP) 05/30/2022 None Detected  NONE DETECTED (Cut Off Level 10 ng/mL) Final   POC Oxazepam (BZO) 05/30/2022 None Detected  NONE DETECTED (Cut Off Level 300 ng/mL) Final   POC Cocaine UR 05/30/2022 Positive (A)  NONE DETECTED (Cut Off Level 300 ng/mL) Final   POC Methamphetamine UR 05/30/2022 Positive (A)  NONE DETECTED (Cut Off Level 1000 ng/mL) Final   POC Morphine 05/30/2022 None Detected  NONE DETECTED (Cut Off Level 300 ng/mL) Final   POC Methadone UR 05/30/2022 None Detected  NONE DETECTED (Cut Off Level 300 ng/mL) Final   POC Oxycodone UR 05/30/2022 None Detected  NONE DETECTED (Cut Off Level 100 ng/mL) Final   POC Marijuana UR 05/30/2022 Positive (A)  NONE DETECTED (Cut Off Level 50 ng/mL) Final   SARSCOV2ONAVIRUS 2 AG 05/30/2022 NEGATIVE  NEGATIVE Final   Comment: (NOTE) SARS-CoV-2 antigen NOT DETECTED.   Negative results are presumptive.  Negative results do not preclude SARS-CoV-2 infection and should not be used as the sole basis for treatment or other patient management decisions, including infection  control decisions, particularly in the presence of clinical signs and  symptoms consistent with COVID-19, or in those  who have been in contact with the virus.  Negative results must be combined with clinical observations, patient history, and epidemiological information. The expected result is Negative.  Fact Sheet for Patients: HandmadeRecipes.com.cy  Fact Sheet for Healthcare Providers: FuneralLife.at  This test is not yet approved or  cleared by the [place] and  has been authorized for detection and/or diagnosis of SARS-CoV-2 by FDA under an Emergency Use Authorization (EUA).  This EUA will remain in effect (meaning this test can be used) for the duration of  the COV                          ID-19 declaration under Section 564(b)(1) of the Act, 21 U.S.C. section 360bbb-3(b)(1), unless the authorization is terminated or revoked sooner.    Admission on 05/21/2022, Discharged on 05/21/2022  Component Date Value Ref Range Status   Glucose-Capillary 05/21/2022 105 (H)  70 - 99 mg/dL Final   Glucose reference range applies only to samples taken after fasting for at least 8 hours.  Admission on 05/20/2022, Discharged on 05/21/2022  Component Date Value Ref Range Status   WBC 05/20/2022 10.8 (H)  4.0 - 10.5 K/uL Final   RBC 05/20/2022 4.79  4.22 - 5.81 MIL/uL Final   Hemoglobin 05/20/2022 13.4  13.0 - 17.0 g/dL Final   HCT 05/20/2022 40.1  39.0 - 52.0 % Final   MCV 05/20/2022 83.7  80.0 - 100.0 fL Final   MCH 05/20/2022 28.0  26.0 - 34.0 pg Final   MCHC 05/20/2022 33.4  30.0 - 36.0 g/dL Final   RDW 05/20/2022 13.6  11.5 - 15.5 % Final   Platelets 05/20/2022 299  150 - 400 K/uL Final   nRBC 05/20/2022 0.0  0.0 - 0.2 % Final   Neutrophils Relative % 05/20/2022 71  % Final   Neutro Abs 05/20/2022 7.7  1.7 - 7.7 K/uL Final   Lymphocytes Relative 05/20/2022 19  % Final   Lymphs Abs 05/20/2022 2.1  0.7 - 4.0 K/uL Final   Monocytes Relative 05/20/2022 8  % Final   Monocytes Absolute 05/20/2022 0.8  0.1 - 1.0 K/uL Final   Eosinophils Relative  05/20/2022 1  % Final   Eosinophils Absolute 05/20/2022 0.1  0.0 - 0.5 K/uL Final   Basophils Relative 05/20/2022 1  % Final   Basophils Absolute 05/20/2022 0.1  0.0 - 0.1 K/uL Final   Immature Granulocytes 05/20/2022 0  % Final   Abs Immature Granulocytes 05/20/2022 0.03  0.00 - 0.07 K/uL Final   Performed at [facility], [place] [address], [place], [place] [address]   Sodium 05/20/2022 134 (L)  135 - 145 mmol/L Final   Potassium 05/20/2022 3.6  3.5 - 5.1 mmol/L Final   Chloride 05/20/2022 104  98 - 111 mmol/L Final   CO2 05/20/2022 21 (L)  22 - 32 mmol/L Final   Glucose, Bld 05/20/2022 213 (H)  70 - 99 mg/dL Final   Glucose reference range applies only to samples taken after fasting for at least 8 hours.   BUN 05/20/2022 19  6 - 20 mg/dL Final   Creatinine, Ser 05/20/2022 1.18  0.61 - 1.24 mg/dL Final   Calcium 05/20/2022 9.0  8.9 - 10.3 mg/dL Final   Total Protein 05/20/2022 6.8  6.5 - 8.1 g/dL Final   Albumin 05/20/2022 4.1  3.5 - 5.0 g/dL Final   AST 05/20/2022 40  15 - 41 U/L Final   ALT 05/20/2022 25  0 - 44 U/L Final   Alkaline Phosphatase 05/20/2022 45  38 - 126 U/L Final   Total Bilirubin 05/20/2022 0.7  0.3 - 1.2 mg/dL Final   GFR, Estimated 05/20/2022 >60  >60 mL/min Final   Comment: (NOTE) Calculated using the  CKD-EPI Creatinine Equation (2021)    Anion gap 05/20/2022 9  5 - 15 Final   Performed at [facility], [place] [address] [place], [place] [address]   Alcohol, Ethyl (B) 05/20/2022 <10  <10 mg/dL Final   Comment: (NOTE) Lowest detectable limit for serum alcohol is 10 mg/dL.  For medical purposes only. Performed at [facility], [place] [address] [place], [place] [address]    Salicylate Lvl 93/90/3009 <7.0 (L)  7.0 - 30.0 mg/dL Final   Performed at [place] [address], [place], [place] [address]   Acetaminophen (Tylenol), Serum 05/20/2022 <10 (L)  10 - 30 ug/mL Final   Comment: (NOTE) Therapeutic concentrations vary significantly. A  range of 10-30 ug/mL  may be an effective concentration for many patients. However, some  are best treated at concentrations outside of this range. Acetaminophen concentrations >150 ug/mL at 4 hours after ingestion  and >50 ug/mL at 12 hours after ingestion are often associated with  toxic reactions.  Performed at [facility], [place] [address], [place], [place] [address]   Admission on 05/14/2022, Discharged on 05/14/2022  Component Date Value Ref Range Status   Sodium 05/14/2022 139  135 - 145 mmol/L Final   Potassium 05/14/2022 3.8  3.5 - 5.1 mmol/L Final   Chloride 05/14/2022 107  98 - 111 mmol/L Final   CO2 05/14/2022 23  22 - 32 mmol/L Final   Glucose, Bld 05/14/2022 76  70 - 99 mg/dL Final   Glucose reference range applies only to samples taken after fasting for at least 8 hours.   BUN 05/14/2022 27 (H)  6 - 20 mg/dL Final   Creatinine, Ser 05/14/2022 0.99  0.61 - 1.24 mg/dL Final   Calcium 05/14/2022 9.0  8.9 - 10.3 mg/dL Final   GFR, Estimated 05/14/2022 >60  >60 mL/min Final   Comment: (NOTE) Calculated using the CKD-EPI Creatinine Equation (2021)    Anion gap 05/14/2022 9  5 - 15 Final   Performed at [facility], [place] [address], [place], [place] [address]   WBC 05/14/2022 7.7  4.0 - 10.5 K/uL Final   RBC 05/14/2022 4.50  4.22 - 5.81 MIL/uL Final   Hemoglobin 05/14/2022 13.0  13.0 - 17.0 g/dL Final   HCT 05/14/2022 38.0 (L)  39.0 - 52.0 % Final   MCV 05/14/2022 84.4  80.0 - 100.0 fL Final   MCH 05/14/2022 28.9  26.0 - 34.0 pg Final   MCHC 05/14/2022 34.2  30.0 - 36.0 g/dL Final   RDW 05/14/2022 13.8  11.5 - 15.5 % Final   Platelets 05/14/2022 285  150 - 400 K/uL Final   nRBC 05/14/2022 0.0  0.0 - 0.2 % Final   Performed at [facility], [place] [address], [place], [place] [address]   Color, Urine 05/14/2022 YELLOW  YELLOW Final   APPearance 05/14/2022 HAZY (A)  CLEAR Final   Specific Gravity, Urine 05/14/2022 1.029  1.005 -  1.030 Final   pH 05/14/2022 6.0  5.0 - 8.0 Final   Glucose, UA 05/14/2022 NEGATIVE  NEGATIVE mg/dL Final   Hgb urine dipstick 05/14/2022 NEGATIVE  NEGATIVE Final   Bilirubin Urine 05/14/2022 NEGATIVE  NEGATIVE Final   Ketones, ur 05/14/2022 NEGATIVE  NEGATIVE mg/dL Final   Protein, ur 05/14/2022 NEGATIVE  NEGATIVE mg/dL Final   Nitrite 05/14/2022 NEGATIVE  NEGATIVE Final   Leukocytes,Ua 05/14/2022 MODERATE (A)  NEGATIVE Final   RBC / HPF 05/14/2022  6-10  0 - 5 RBC/hpf Final   WBC, UA 05/14/2022 >50 (H)  0 - 5 WBC/hpf Final   Bacteria, UA 05/14/2022 NONE SEEN  NONE SEEN Final   Squamous Epithelial / LPF 05/14/2022 0-5  0 - 5 Final   Mucus 05/14/2022 PRESENT   Final   Performed at [place] [address] [place], [place] [address]   Glucose-Capillary 05/14/2022 107 (H)  70 - 99 mg/dL Final   Glucose reference range applies only to samples taken after fasting for at least 8 hours.   Troponin I (High Sensitivity) 05/14/2022 4  <18 ng/L Final   Comment: (NOTE) Elevated high sensitivity troponin I (hsTnI) values and significant  changes across serial measurements may suggest ACS but many other  chronic and acute conditions are known to elevate hsTnI results.  Refer to the "Links" section for chest pain algorithms and additional  guidance. Performed at [facility], [place] [address] [place], [place] [address]    Acetaminophen (Tylenol), Serum 05/14/2022 <10 (L)  10 - 30 ug/mL Final   Performed at [facility], [place] [address] [place], [place] [address]   Salicylate Lvl 42/70/6237 <7.0 (L)  7.0 - 30.0 mg/dL Final   Performed at [place] [address] [place], [place] [address]   Opiates 05/14/2022 NONE DETECTED  NONE DETECTED Final   Cocaine 05/14/2022 POSITIVE (A)  NONE DETECTED Final   Benzodiazepines 05/14/2022 NONE DETECTED  NONE DETECTED Final   Amphetamines 05/14/2022 NONE DETECTED  NONE DETECTED  Final   Tetrahydrocannabinol 05/14/2022 POSITIVE (A)  NONE DETECTED Final   Barbiturates 05/14/2022 NONE DETECTED  NONE DETECTED Final   Comment: (NOTE) DRUG SCREEN FOR MEDICAL PURPOSES ONLY.  IF CONFIRMATION IS NEEDED FOR ANY PURPOSE, NOTIFY LAB WITHIN 5 DAYS.  LOWEST DETECTABLE LIMITS FOR URINE DRUG SCREEN Drug Class                     Cutoff (ng/mL) Amphetamine and metabolites    1000 Barbiturate and metabolites    200 Benzodiazepine                 517 Tricyclics and metabolites     300 Opiates and metabolites        300 Cocaine and metabolites        300 THC                            50 Performed at [facility], [place] [address], [place], [place] [address]   There may be more visits with results that are not included.    Allergies: Patient has no known allergies.  PTA Medications: (Not in a hospital admission)   Medical Decision Making  ***    Recommendations  [facility] MSE Recommendations:[id number]}  Randon Goldsmith, NP 08/13/22  8:44 PM

## 2022-08-13 NOTE — ED Provider Triage Note (Signed)
Emergency Medicine Provider Triage Evaluation Note  Tyler White , a 26 y.o. male  was evaluated in triage.  Pt complains of "I am hearing voices".  When asked what the voices are saying he says "they are telling me to harm myself:".  Patient staring off into space not making eye contact with this provider.  Not answering any other questions, to staring blankly ahead.  Review of Systems  Per HPI  Physical Exam  BP 122/84 (BP Location: Right Arm)   Pulse (!) 105   Temp 99.5 F (37.5 C) (Oral)   Resp 18   SpO2 97%  Gen:   Awake, no distress   Resp:  Normal effort  MSK:   Moves extremities without difficulty  Other:  Patient is well-appearing, not particularly cooperative.  Not responding to internal stimuli  Medical Decision Making  Medically screening exam initiated at 12:40 PM.  Appropriate orders placed.  Tyler White was informed that the remainder of the evaluation will be completed by another provider, this initial triage assessment does not replace that evaluation, and the importance of remaining in the ED until their evaluation is complete.     Sherrill Raring, PA-C 08/13/22 1241

## 2022-08-13 NOTE — ED Provider Notes (Signed)
[facility] EMERGENCY DEPARTMENT Provider Note   CSN: [id number] Arrival date & time: 08/13/22  1155     History  Chief Complaint  Patient presents with   Suicidal    Tyler White is a 26 y.o. male.  HPI 26 year old male presents requesting mental health help.  He has been feeling suicidal since he was in jail on suicide watch a couple months ago.  States he still feels suicidal though not really worse today.  He feels like he needs to be on psychiatric medicine.  When asked what he has been on in the past he states he was on Risperdal 1 mg when in jail.  He is not currently on this.  He feels like he needs a long-acting injection.  Denies any medical complaints such as chest pain, shortness of breath, vomiting.  Home Medications Prior to Admission medications   Medication Sig Start Date End Date Taking? Authorizing Provider  paliperidone (INVEGA SUSTENNA) 234 MG/1.5ML SUSY injection Inject 234 mg into the muscle once for 1 dose. Patient not taking: Reported on 06/28/2022 04/16/22 06/28/22  Tharon Aquas, NP  gabapentin (NEURONTIN) 400 MG capsule Take 1 capsule (400 mg total) by mouth 3 (three) times daily. Patient not taking: Reported on 06/09/2021 04/30/21 06/10/21  Ethelene Hal, NP      Allergies    Patient has no known allergies.    Review of Systems   Review of Systems  Respiratory:  Negative for cough and shortness of breath.   Cardiovascular:  Negative for chest pain.  Gastrointestinal:  Negative for vomiting.  Psychiatric/Behavioral:  Positive for suicidal ideas.     Physical Exam Updated Vital Signs BP 110/77 (BP Location: Right Arm)   Pulse 86   Temp 98.8 F (37.1 C) (Oral)   Resp 16   SpO2 99%  Physical Exam Vitals and nursing note reviewed.  Constitutional:      Appearance: He is well-developed.  HENT:     Head: Normocephalic and atraumatic.  Cardiovascular:     Rate and Rhythm: Normal rate and regular rhythm.      Heart sounds: Normal heart sounds.  Pulmonary:     Effort: Pulmonary effort is normal.     Breath sounds: Normal breath sounds.  Abdominal:     General: There is no distension.  Skin:    General: Skin is warm and dry.  Neurological:     Mental Status: He is alert.  Psychiatric:        Mood and Affect: Affect is flat.        Thought Content: Thought content includes suicidal ideation. Thought content does not include suicidal plan.     Comments: Patient speaks quietly and has a flat affect.  Not obviously hallucinating/responding to internal stimuli.     ED Results / Procedures / Treatments   Labs (all labs ordered are listed, but only abnormal results are displayed) Labs Reviewed  COMPREHENSIVE METABOLIC PANEL - Abnormal; Notable for the following components:      Result Value   Glucose, Bld 106 (*)    BUN 21 (*)    AST 98 (*)    All other components within normal limits  CBC WITH DIFFERENTIAL/PLATELET - Abnormal; Notable for the following components:   WBC 12.6 (*)    Neutro Abs 9.4 (*)    Monocytes Absolute 1.3 (*)    All other components within normal limits  RESP PANEL BY RT-PCR (FLU A&B, COVID) [id number]  ETHANOL  RAPID  URINE DRUG SCREEN, HOSP PERFORMED    EKG None  Radiology No results found.  Procedures Procedures    Medications Ordered in ED Medications - No data to display  ED Course/ Medical Decision Making/ A&P                           Medical Decision Making  Patient has some passive SI.  Otherwise is well-appearing.  Seems to have stable and chronic psychiatric illness.  Labs show a mild leukocytosis of unclear etiology as he has no other symptoms.  I consulted the [facility] provider who asked me to talk to the onsite provider, Vesta Mixer, who will come see and evaluate for disposition.  Otherwise seems medically stable for psychiatric disposition.        Final Clinical Impression(s) / ED Diagnoses Final diagnoses:  Suicidal ideation     Rx / DC Orders ED Discharge Orders     None         Sherwood Gambler, MD 08/13/22 [phone]

## 2022-08-13 NOTE — ED Notes (Addendum)
Pt transferred to facility from [facility] endorsing passive SI no plan and cocaine abuse. Pt quiet during assessment. Pt states, "I'm just tired of living like this. I have nowhere to go. I need to get back on my psych meds". Support given. Oriented to unit and unit rules. Meal offered. Informed pt to notify staff with any needs or concerns. Will monitor for safety.

## 2022-08-13 NOTE — ED Notes (Signed)
Pt sleeping@this time. Breathing even and unlabored. Will continue to monitor for safety 

## 2022-08-14 LAB — TSH: TSH: 1.44 u[IU]/mL (ref 0.350–4.500)

## 2022-08-14 MED ORDER — RISPERIDONE 1 MG PO TABS
1.0000 mg | ORAL_TABLET | Freq: Every day | ORAL | Status: DC
  Administered 2022-08-14: 1 mg via ORAL
  Filled 2022-08-14: qty 1

## 2022-08-14 MED ORDER — PALIPERIDONE PALMITATE ER 234 MG/1.5ML IM SUSY
234.0000 mg | PREFILLED_SYRINGE | Freq: Once | INTRAMUSCULAR | Status: AC
  Administered 2022-08-14: 234 mg via INTRAMUSCULAR

## 2022-08-14 NOTE — ED Notes (Signed)
Pt has c/o of leg pain Tylenol 650 mg was offered pt declined

## 2022-08-14 NOTE — Discharge Instructions (Signed)
Based on what you have shared, a list of resources for outpatient therapy and psychiatry is provided below to get you started back on treatment.  It is imperative that you follow through with treatment within 5-7 days from the day of discharge to prevent any further risk to your safety or mental well-being.  You are not limited to the list provided.  In case of an urgent crisis, you may contact the Mobile Crisis Unit with [facility] at [phone].       Again, you are advised to follow up with getting ACTT services as this will benefit you with making sure you maintain your medications and appointments so that you can remain functional, out of the hospital, and living independently.  For your behavioral health needs, you are advised to follow up with an outpatient provider.  You may be eligible for ACT Team services, which would include more frequent visits with your provider, as well as in-home services.  The following providers offer ACT Team services.  Contact them at your earliest opportunity to ask about enrolling in their program:       [facility]      [address], [address] [place], [place] [address]      [phone] [address] [place]      [place], [place] [address]      [phone]       [facility] [place] [place], [place] [address]      [phone]       [facility] ACT Team      [facility], [address]      [address]      [place],   [address]      [phone]       [facility]      [address]      [place],  [address]      [phone]      Outpatient Services for Therapy and Medication Management for Medicaid  [facility]  (You will need to inquire if they accept Medicaid from [facility] [employer]. [place] residents [address]                                                          And those with [facility] are given first preference.) [place], [place], [address] [phone] phone   New Patient Assessment/Therapy Walk-Ins:  Monday and Wednesday: 8 am until slots are full. Every 1st and 2nd Fridays of the month: 1 pm - 5 pm.  NO ASSESSMENT/THERAPY WALK-INS ON [place]  New Patient Assessment/Medication Management Walk-Ins:  Monday - Friday:  8 am - 11 am.  For all walk-ins, we ask that you arrive by 7:30 am because patients will be seen in the order of arrival.  Availability is limited; therefore, you may not be seen on the same day that you walk-in.  Our goal is to serve and meet the needs of our community to the best of our ability.   [facility] [address] [place], [place], [address] [phone]  phone  [facility] [address] [place], [place], [address] [phone] phone  [facility] [address], [place], [place], [address] [phone] phone  [facility] [address] [place], [address] [phone] phone  [facility], [place] [address], [place], [address] [phone] phone  [facility] [place] [place], [place], [address] [phone] phone [phone] fax  [facility] [address] [place], [place], [address] [phone] phone [phone] fax  [facility] [phone] [address] [place], [place], [address] [phone] phone  [facility] [address] [place], [place], [address]  [phone] phone  [facility]. [place], [place], [address]  [phone] phone [phone] fax

## 2022-08-14 NOTE — BH Assessment (Signed)
LCSW Progress Note  LCSW spoke with pt regarding what treatment recommendations, such as ACTT, that he has followed through with since his last discharge from the [facility] and ED.  Pt reported that he did not follow through with anything and even declined to seek ACTT or CST services.  Per Corky Sox, MD, this pt does not require psychiatric hospitalization at this time.  Pt is psychiatrically cleared.  Discharge instructions include several resources for ACTT and outpatient therapy and medication management.  EDP Corky Sox, MD, has been notified.  Omelia Blackwater, MSW, [place] [phone] or [phone]

## 2022-08-14 NOTE — ED Notes (Signed)
Pt was given muffin and juice for breakfast.

## 2022-08-14 NOTE — ED Notes (Addendum)
Sleeping easily awakened for food, complaint with HS medications, minimal interaction with staff or peers.

## 2022-08-14 NOTE — ED Notes (Signed)
Patient took his Risperdal this morning without any compliants. Denies any current SI/HI/AVH, patient is back asleep with his head under the covers.

## 2022-08-14 NOTE — ED Notes (Signed)
Tyler White is currently asleep with no signs of distress noted respiration are easy and skin color appropriate for his ethnicity.

## 2022-08-14 NOTE — ED Provider Notes (Signed)
FBC/OBS ASAP Discharge Summary  Date and Time: 08/14/2022 4:45 PM  Name: Tyler White  MRN:  [id number]   Discharge Diagnoses:  Final diagnoses:  Schizoaffective disorder, bipolar type ([place])  Homelessness  Noncompliance with medications  Cocaine abuse with cocaine-induced mood disorder (Tyler White)  Crack cocaine use    Subjective:  Tyler White is a 26 year old male with a past psychiatric history of schizoaffective disorder bipolar type, polysubstance abuse, homelessness, and substance-induced mood disorder.  He has presented numerous times to the emergency department in behavioral health urgent care over the past several months.  Patient was most recently admitted for psychosis in January 2023.  At that time the patient was stabilized on Risperdal and Zyprexa and was eventually given Kirt Boys.  He last appears to have received [place] on 6/7 while at the behavioral health urgent care.  The patient presented to the [facility] behavioral health urgent care on 8/15 complaining of wanting to get back on his medication and suicidal thoughts without a plan.  On interview and assessment this morning, the patient is calm and logical.  He provides appropriate answers to questions.  He does exhibit a blunted affect and a small amount of speech latency.  There is no evidence the patient is responding internal stimuli.  He denies experiencing auditory or visual hallucinations.  He denies experiencing suicidal thoughts.  He feels that he can be safe in an unsupervised setting.  The patient is future oriented and states that he is interested in improving his mental health so that he can be a better father to his daughter.  He also states that he is interested in working at a [employer].   Stay Summary: Patient was given Invega Sustenna 234 mg on 8/16.  There were no behavioral problems while the patient was admitted to the observation unit.  Total Time spent with patient: 15  minutes  Past Psychiatric History: as above Past Medical History:  Past Medical History:  Diagnosis Date   Hernia, inguinal, right    Inguinal hernia    right   Psychiatric illness    Schizophrenia ([place])    No past surgical history on file. Family History:  Family History  Problem Relation Age of Onset   Psychiatric Illness Mother    Hypertension Sister    Family Psychiatric History: per H and P Social History:  Social History   Substance and Sexual Activity  Alcohol Use Not Currently     Social History   Substance and Sexual Activity  Drug Use Not Currently   Types: Marijuana, Cocaine   Comment: crack/cocaine occasionally    Social History   Socioeconomic History   Marital status: Single    Spouse name: Not on file   Number of children: Not on file   Years of education: 10   Highest education level: Not on file  Occupational History   Not on file  Tobacco Use   Smoking status: Some Days    Packs/day: 0.50    Years: 5.00    Total pack years: 2.50    Types: Cigarettes   Smokeless tobacco: Never  Vaping Use   Vaping Use: Never used  Substance and Sexual Activity   Alcohol use: Not Currently   Drug use: Not Currently    Types: Marijuana, Cocaine    Comment: crack/cocaine occasionally   Sexual activity: Yes    Birth control/protection: None  Other Topics Concern   Not on file  Social History Narrative   **  Merged History Encounter **       ** Merged History Encounter **       Social Determinants of Health   Financial Resource Strain: Not on file  Food Insecurity: Not on file  Transportation Needs: Not on file  Physical Activity: Not on file  Stress: Not on file  Social Connections: Not on file   SDOH:  SDOH Screenings   Alcohol Screen: Low Risk  (01/29/2022)   Alcohol Screen    Last Alcohol Screening Score (AUDIT): 0  Depression (PHQ2-9): High Risk (06/19/2022)   Depression (PHQ2-9)    PHQ-2 Score: 18  Financial Resource Strain: Not on  file  Food Insecurity: Not on file  Housing: Not on file  Physical Activity: Not on file  Social Connections: Not on file  Stress: Not on file  Tobacco Use: High Risk (07/03/2022)   Patient History    Smoking Tobacco Use: Some Days    Smokeless Tobacco Use: Never    Passive Exposure: Not on file  Transportation Needs: Not on file    Tobacco Cessation:  A prescription for an FDA-approved tobacco cessation medication provided at discharge  Current Medications:  Current Facility-Administered Medications  Medication Dose Route Frequency Provider Last Rate Last Admin   acetaminophen (TYLENOL) tablet 650 mg  650 mg Oral Q6H PRN Onuoha, Chinwendu V, NP       alum & mag hydroxide-simeth (MAALOX/MYLANTA) 200-200-20 MG/5ML suspension 30 mL  30 mL Oral Q4H PRN Onuoha, Chinwendu V, NP       hydrOXYzine (ATARAX) tablet 25 mg  25 mg Oral TID PRN Onuoha, Chinwendu V, NP   25 mg at 08/13/22 2133   magnesium hydroxide (MILK OF MAGNESIA) suspension 30 mL  30 mL Oral Daily PRN Onuoha, Chinwendu V, NP       risperiDONE (RISPERDAL) tablet 1 mg  1 mg Oral Daily Onuoha, Chinwendu V, NP   1 mg at 08/14/22 0904   traZODone (DESYREL) tablet 50 mg  50 mg Oral QHS PRN Onuoha, Chinwendu V, NP       Current Outpatient Medications  Medication Sig Dispense Refill   paliperidone (INVEGA SUSTENNA) 234 MG/1.5ML SUSY injection Inject 234 mg into the muscle once for 1 dose. (Patient not taking: Reported on 06/28/2022) 1.5 mL 0    PTA Medications: (Not in a hospital admission)      06/19/2022    6:37 AM 06/07/2022    9:00 AM 06/05/2022   10:04 AM  Depression screen PHQ 2/9  Decreased Interest '3 3 1  '$ Down, Depressed, Hopeless '3 3 1  '$ PHQ - 2 Score '6 6 2  '$ Altered sleeping 2 1 0  Tired, decreased energy '1 1 1  '$ Change in appetite 1 0 0  Feeling bad or failure about yourself  '3 3 1  '$ Trouble concentrating 2 1 0  Moving slowly or fidgety/restless 1 3 0  Suicidal thoughts 2 3 0  PHQ-9 Score '18 18 4  '$ Difficult doing  work/chores Very difficult Somewhat difficult Not difficult at all    [place] ED from 08/13/2022 in [facility] Most recent reading at 08/13/2022  6:47 PM ED from 08/13/2022 in [place] Most recent reading at 08/13/2022 12:29 PM ED from 07/03/2022 in [place] [facility] Most recent reading at 07/03/2022  9:06 PM  C-SSRS RISK CATEGORY High Risk High Risk High Risk       Musculoskeletal  Strength & Muscle Tone: within normal limits  Gait & Station: normal Patient leans: N/A  Psychiatric Specialty Exam  Presentation  General Appearance: Disheveled  Eye Contact:Poor  Speech:Clear and Coherent  Speech Volume:Normal  Handedness:Right   Mood and Affect  Mood:Euthymic  Affect:Blunt   Thought Process  Thought Processes:Coherent  Descriptions of Associations:Intact  Orientation:Full (Time, Place and Person)  Thought Content:Logical  Diagnosis of Schizophrenia or Schizoaffective disorder in past: Yes  Duration of Psychotic Symptoms: Greater than six months   Hallucinations: none Ideas of Reference:None  Suicidal Thoughts: denies Homicidal Thoughts:Homicidal Thoughts: No   Sensorium  Memory:Immediate Good; Recent Good; Remote Good  Judgment:Poor  Insight:Poor   Executive Functions  Concentration:Fair  Attention Span:Fair  [place]   Psychomotor Activity  Psychomotor Activity:Psychomotor Activity: Normal   Assets  Assets:Communication Skills; Desire for Improvement   Sleep  Sleep:Sleep: Fair   Nutritional Assessment (For OBS and FBC admissions only) Has the patient had a weight loss or gain of 10 pounds or more in the last 3 months?: No Has the patient had a decrease in food intake/or appetite?: No Does the patient have dental problems?: No Does the patient have eating habits or behaviors that may be  indicators of an eating disorder including binging or inducing vomiting?: No Has the patient recently lost weight without trying?: 0 Has the patient been eating poorly because of a decreased appetite?: 0 Malnutrition Screening Tool Score: 0    Physical Exam  Physical Exam Constitutional:      Appearance: the patient is not toxic-appearing.  Pulmonary:     Effort: Pulmonary effort is normal.  Neurological:     General: No focal deficit present.     Mental Status: the patient is alert and oriented to person, place, and time.   Review of Systems  Respiratory:  Negative for shortness of breath.   Cardiovascular:  Negative for chest pain.  Gastrointestinal:  Negative for abdominal pain, constipation, diarrhea, nausea and vomiting.  Neurological:  Negative for headaches.   Blood pressure 107/72, pulse 98, temperature 97.8 F (36.6 C), temperature source Oral, resp. rate 16, SpO2 96 %. There is no height or weight on file to calculate BMI.  Demographic Factors:  Male  Loss Factors: NA  Historical Factors: NA  Risk Reduction Factors:   Positive coping skills or problem solving skills  Continued Clinical Symptoms:  Schizoaffective disorder  Cognitive Features That Contribute To Risk:  None    Suicide Risk:  Mild: Patient presented with some identifiable suicidal ideation in the context of housing instability and being off of his medication.  The patient has received an [place] sustenna injection.  He does not appear psychotic.  He is future oriented and has demonstrated good behavioral and emotional regulation while on the unit.  Plan Of Care/Follow-up recommendations:  Activity: as tolerated  Diet: heart healthy  Other: -Follow-up with your outpatient psychiatric provider - information provided in the discharge instructions.   -Take your psychiatric medications as prescribed at discharge - instructions are provided to you in the discharge paperwork.  -Follow-up with  outpatient primary care doctor and other specialists -for management of preventative medicine and chronic medical disease.   -Recommend abstinence from alcohol, tobacco, and other illicit drug use at discharge.   -If your psychiatric symptoms recur, worsen, or if you have side effects to your psychiatric medications, call your outpatient psychiatric provider, 911, [phone] or go to the nearest emergency department.   Disposition: self care  Corky Sox, MD 08/14/2022, 4:45 PM

## 2022-08-19 ENCOUNTER — Emergency Department (HOSPITAL_COMMUNITY)
Admission: EM | Admit: 2022-08-19 | Discharge: 2022-08-19 | Disposition: A | Discharge status: Home / Self Care | Payer: Medicaid Other | Attending: Emergency Medicine | Admitting: Emergency Medicine

## 2022-08-19 ENCOUNTER — Other Ambulatory Visit: Payer: Self-pay

## 2022-08-19 DIAGNOSIS — Z8659 Personal history of other mental and behavioral disorders: Secondary | ICD-10-CM | POA: Insufficient documentation

## 2022-08-19 DIAGNOSIS — F32A Depression, unspecified: Secondary | ICD-10-CM | POA: Diagnosis not present

## 2022-08-19 DIAGNOSIS — F17210 Nicotine dependence, cigarettes, uncomplicated: Secondary | ICD-10-CM | POA: Diagnosis not present

## 2022-08-19 DIAGNOSIS — F17290 Nicotine dependence, other tobacco product, uncomplicated: Secondary | ICD-10-CM | POA: Insufficient documentation

## 2022-08-19 DIAGNOSIS — R45851 Suicidal ideations: Secondary | ICD-10-CM | POA: Diagnosis not present

## 2022-08-19 DIAGNOSIS — F1910 Other psychoactive substance abuse, uncomplicated: Secondary | ICD-10-CM | POA: Insufficient documentation

## 2022-08-19 DIAGNOSIS — F1490 Cocaine use, unspecified, uncomplicated: Secondary | ICD-10-CM | POA: Diagnosis not present

## 2022-08-19 DIAGNOSIS — R440 Auditory hallucinations: Secondary | ICD-10-CM | POA: Insufficient documentation

## 2022-08-19 LAB — COMPREHENSIVE METABOLIC PANEL
ALT: 36 U/L (ref 0–44)
AST: 41 U/L (ref 15–41)
Albumin: 3.4 g/dL — ABNORMAL LOW (ref 3.5–5.0)
Alkaline Phosphatase: 55 U/L (ref 38–126)
Anion gap: 8 (ref 5–15)
BUN: 16 mg/dL (ref 6–20)
CO2: 25 mmol/L (ref 22–32)
Calcium: 8.9 mg/dL (ref 8.9–10.3)
Chloride: 108 mmol/L (ref 98–111)
Creatinine, Ser: 0.93 mg/dL (ref 0.61–1.24)
GFR, Estimated: >60 mL/min (ref >60)
Glucose, Bld: 107 mg/dL — ABNORMAL HIGH (ref 70–99)
Potassium: 3.8 mmol/L (ref 3.5–5.1)
Sodium: 141 mmol/L (ref 135–145)
Total Bilirubin: 0.3 mg/dL (ref 0.3–1.2)
Total Protein: 6.5 g/dL (ref 6.5–8.1)

## 2022-08-19 LAB — CBC WITH DIFFERENTIAL/PLATELET
Abs Immature Granulocytes: 0.05 10*3/uL (ref 0.00–0.07)
Basophils Absolute: 0 10*3/uL (ref 0.0–0.1)
Basophils Relative: 0 %
Eosinophils Absolute: 0.3 10*3/uL (ref 0.0–0.5)
Eosinophils Relative: 3 %
HCT: 36.2 % — ABNORMAL LOW (ref 39.0–52.0)
Hemoglobin: 12.1 g/dL — ABNORMAL LOW (ref 13.0–17.0)
Immature Granulocytes: 1 %
Lymphocytes Relative: 20 %
Lymphs Abs: 1.9 10*3/uL (ref 0.7–4.0)
MCH: 28.1 pg (ref 26.0–34.0)
MCHC: 33.4 g/dL (ref 30.0–36.0)
MCV: 84 fL (ref 80.0–100.0)
Monocytes Absolute: 1.1 10*3/uL — ABNORMAL HIGH (ref 0.1–1.0)
Monocytes Relative: 11 %
Neutro Abs: 6.2 10*3/uL (ref 1.7–7.7)
Neutrophils Relative %: 65 %
Platelets: 277 10*3/uL (ref 150–400)
RBC: 4.31 MIL/uL (ref 4.22–5.81)
RDW: 13.4 % (ref 11.5–15.5)
WBC: 9.6 10*3/uL (ref 4.0–10.5)
nRBC: 0 % (ref 0.0–0.2)

## 2022-08-19 LAB — RAPID URINE DRUG SCREEN, HOSP PERFORMED
Amphetamines: NOT DETECTED
Barbiturates: NOT DETECTED
Benzodiazepines: NOT DETECTED
Cocaine: POSITIVE — AB
Opiates: NOT DETECTED
Tetrahydrocannabinol: POSITIVE — AB

## 2022-08-19 LAB — ETHANOL: Alcohol, Ethyl (B): <10 mg/dL (ref <10)

## 2022-08-19 NOTE — Consult Note (Signed)
[facility] ED ASSESSMENT   Reason for Consult:  eval Referring Physician:  Deborah Chalk, [place] Patient Identification: Tyler White MRN:  [id number] ED Chief Complaint: Auditory hallucination  Diagnosis:  Principal Problem:   Auditory hallucination Active Problems:   Polysubstance abuse [facility])   ED Assessment Time Calculation: Start Time: 0930 Stop Time: 1000 Total Time in Minutes (Assessment Completion): 30   Subjective:   Tyler White is a 26 y.o. male patient who voluntarily presented to [facility] ED for complaints of command hallucinations.  States he is hearing voices telling him to kill himself.  No plan expressed.  Reports his hallucinations have been present for 4 years and worsened when he uses drugs.  He endorses smoking crack yesterday.  HPI:   Patient seen in his room this morning at [facility] ED for face-to-face evaluation.  He is pleasant, calm, cooperative with assessment.  He does minimally engage, and usually responds to my questions with few words.  He does endorse smoking crack cocaine yesterday which then resulted in a command auditory hallucinations telling him to hurt himself.  Patient denies any suicidal or homicidal ideations today.  Tells me he is still hearing auditory hallucinations but will not specify what he is hearing.  I asked if his auditory hallucinations are telling him to hurt himself today and he shakes his head indicating no. He states he has been having AH for around 4 years now with no apparent alleviating factors. He continues to endorse visual hallucinations of black/shadow like circles, after chart review it appears these visual hallucinations are chronic.  He endorses being homeless.  I asked him if he followed up with any of the substance abuse or shelter resources that were given to them at his prior hospitalization here around 5 days ago, he states no.  I asked if he utilizes community resources like the [facility] center and he stated no.  When I asked him why  he did not follow through with any resources given to him he shrugged his shoulders and stated I do not know. Patient stated he will go to the interactive resource center today to utilize the resources and amenities.  Patient is able to contract for safety at this time, denies access to any weapons or firearms.  Denies having any family or friends for me to contact for collateral.  This patient has a history of endorsing CAH/SI. He has presented to ED/UC 25 times within 3 months for hallucinations, polysubstance abuse, SI, and homelessness. While in the ED he has demonstrated no unsafe behavior, has not been witnessed RTIS, and mostly slept. Patient's SI/CAH appears to be conditional and is a means of manipulation without a true intention to harm self, and is likely for secondary gain of housing. His behavior is more consistent with someone who is acting in self-preservation, rather than someone who is acutely suicidal.  He is able to engage in coherent and logical conversation.  He denies any suicidal or homicidal ideations.  He is able to contract for safety, and is understanding of the resources that will be left for him in his AVS.  Patient received Invega Sustenna 234 mg on 08/14/2022 at [facility]  Patient instructed to return to behavioral health urgent care for second dose on Wednesday, 08/21/2022.  Patient expressed verbal understanding of this plan.   Past Psychiatric History:  Numerous presentations to ED/UC for polysubstance abuse, suicidal ideations, homelessness, and hallucinations.  Reported history of schizoaffective disorder bipolar type.  History of medication noncompliance.  Numerous inpatient  psychiatric admissions.  Risk to Self or Others: Is the patient at risk to self? No Has the patient been a risk to self in the past 6 months? No Has the patient been a risk to self within the distant past? No Is the patient a risk to others? No Has the patient been a risk to others in the past 6 months?  No Has the patient been a risk to others within the distant past? No  [place] Scale:  [place] ED from 08/19/2022 in [place] Most recent reading at 08/19/2022  5:49 AM ED from 08/13/2022 in [facility] Most recent reading at 08/13/2022  6:47 PM ED from 08/13/2022 in [place] Most recent reading at 08/13/2022 12:29 PM  C-SSRS RISK CATEGORY High Risk High Risk High Risk       Past Medical History:  Past Medical History:  Diagnosis Date   Hernia, inguinal, right    Inguinal hernia    right   Psychiatric illness    Schizophrenia ([place])    No past surgical history on file. Family History:  Family History  Problem Relation Age of Onset   Psychiatric Illness Mother    Hypertension Sister     Social History:  Social History   Substance and Sexual Activity  Alcohol Use Not Currently     Social History   Substance and Sexual Activity  Drug Use Not Currently   Types: Marijuana, Cocaine   Comment: crack/cocaine occasionally    Social History   Socioeconomic History   Marital status: Single    Spouse name: Not on file   Number of children: Not on file   Years of education: 10   Highest education level: Not on file  Occupational History   Not on file  Tobacco Use   Smoking status: Some Days    Packs/day: 0.50    Years: 5.00    Total pack years: 2.50    Types: Cigarettes   Smokeless tobacco: Never  Vaping Use   Vaping Use: Never used  Substance and Sexual Activity   Alcohol use: Not Currently   Drug use: Not Currently    Types: Marijuana, Cocaine    Comment: crack/cocaine occasionally   Sexual activity: Yes    Birth control/protection: None  Other Topics Concern   Not on file  Social History Narrative   ** Merged History Encounter **       ** Merged History Encounter **       Social Determinants of Health   Financial Resource Strain: Not on file   Food Insecurity: Not on file  Transportation Needs: Not on file  Physical Activity: Not on file  Stress: Not on file  Social Connections: Not on file   Additional Social History:    Allergies:  No Known Allergies  Labs:  Results for orders placed or performed during the hospital encounter of 08/19/22 (from the past 48 hour(s))  CBC with Differential     Status: Abnormal   Collection Time: 08/19/22  6:00 AM  Result Value Ref Range   WBC 9.6 4.0 - 10.5 K/uL   RBC 4.31 4.22 - 5.81 MIL/uL   Hemoglobin 12.1 (L) 13.0 - 17.0 g/dL   HCT 36.2 (L) 39.0 - 52.0 %   MCV 84.0 80.0 - 100.0 fL   MCH 28.1 26.0 - 34.0 pg   MCHC 33.4 30.0 - 36.0 g/dL   RDW 13.4 11.5 - 15.5 %  Platelets 277 150 - 400 K/uL   nRBC 0.0 0.0 - 0.2 %   Neutrophils Relative % 65 %   Neutro Abs 6.2 1.7 - 7.7 K/uL   Lymphocytes Relative 20 %   Lymphs Abs 1.9 0.7 - 4.0 K/uL   Monocytes Relative 11 %   Monocytes Absolute 1.1 (H) 0.1 - 1.0 K/uL   Eosinophils Relative 3 %   Eosinophils Absolute 0.3 0.0 - 0.5 K/uL   Basophils Relative 0 %   Basophils Absolute 0.0 0.0 - 0.1 K/uL   Immature Granulocytes 1 %   Abs Immature Granulocytes 0.05 0.00 - 0.07 K/uL    Comment: Performed at [facility] [address], [place], [place] [address]  Comprehensive metabolic panel     Status: Abnormal   Collection Time: 08/19/22  6:00 AM  Result Value Ref Range   Sodium 141 135 - 145 mmol/L   Potassium 3.8 3.5 - 5.1 mmol/L   Chloride 108 98 - 111 mmol/L   CO2 25 22 - 32 mmol/L   Glucose, Bld 107 (H) 70 - 99 mg/dL    Comment: Glucose reference range applies only to samples taken after fasting for at least 8 hours.   BUN 16 6 - 20 mg/dL   Creatinine, Ser 0.93 0.61 - 1.24 mg/dL   Calcium 8.9 8.9 - 10.3 mg/dL   Total Protein 6.5 6.5 - 8.1 g/dL   Albumin 3.4 (L) 3.5 - 5.0 g/dL   AST 41 15 - 41 U/L   ALT 36 0 - 44 U/L   Alkaline Phosphatase 55 38 - 126 U/L   Total Bilirubin 0.3 0.3 - 1.2 mg/dL   GFR, Estimated >60 >60  mL/min    Comment: (NOTE) Calculated using the CKD-EPI Creatinine Equation (2021)    Anion gap 8 5 - 15    Comment: Performed at [facility] [address] [place], [place] [address]  Ethanol     Status: None   Collection Time: 08/19/22  6:00 AM  Result Value Ref Range   Alcohol, Ethyl (B) <10 <10 mg/dL    Comment: (NOTE) Lowest detectable limit for serum alcohol is 10 mg/dL.  For medical purposes only. Performed at [facility], [place] [address], [place], [place] [address]   Rapid urine drug screen (hospital performed)     Status: Abnormal   Collection Time: 08/19/22  6:14 AM  Result Value Ref Range   Opiates NONE DETECTED NONE DETECTED   Cocaine POSITIVE (A) NONE DETECTED   Benzodiazepines NONE DETECTED NONE DETECTED   Amphetamines NONE DETECTED NONE DETECTED   Tetrahydrocannabinol POSITIVE (A) NONE DETECTED   Barbiturates NONE DETECTED NONE DETECTED    Comment: (NOTE) DRUG SCREEN FOR MEDICAL PURPOSES ONLY.  IF CONFIRMATION IS NEEDED FOR ANY PURPOSE, NOTIFY LAB WITHIN 5 DAYS.  LOWEST DETECTABLE LIMITS FOR URINE DRUG SCREEN Drug Class                     Cutoff (ng/mL) Amphetamine and metabolites    1000 Barbiturate and metabolites    200 Benzodiazepine                 062 Tricyclics and metabolites     300 Opiates and metabolites        300 Cocaine and metabolites        300 THC  50 Performed at [facility], [place] [address] [place], [place] [address]     No current facility-administered medications for this encounter.   Current Outpatient Medications  Medication Sig Dispense Refill   paliperidone (INVEGA SUSTENNA) 234 MG/1.5ML SUSY injection Inject 234 mg into the muscle once for 1 dose. (Patient not taking: Reported on 06/28/2022) 1.5 mL 0    Psychiatric Specialty Exam: Presentation  General Appearance: Appropriate for Environment  Eye Contact:Fair  Speech:Clear and Coherent  Speech  Volume:Normal  Handedness:Right   Mood and Affect  Mood:Euthymic  Affect:Congruent   Thought Process  Thought Processes:Coherent  Descriptions of Associations:Intact  Orientation:Full (Time, Place and Person)  Thought Content:Logical  History of Schizophrenia/Schizoaffective disorder:Yes  Duration of Psychotic Symptoms:Greater than six months  Hallucinations:Hallucinations: Auditory  Ideas of Reference:None  Suicidal Thoughts:Suicidal Thoughts: No  Homicidal Thoughts:Homicidal Thoughts: No   Sensorium  Memory:Immediate Fair  Judgment:Fair  Insight:Fair   Executive Functions  Concentration:Fair  Attention Span:Fair  [place]   Psychomotor Activity  Psychomotor Activity:Psychomotor Activity: Normal   Assets  Assets:Communication Skills; Physical Health; Resilience    Sleep  Sleep:Sleep: Fair   Physical Exam: Physical Exam Neurological:     Mental Status: He is alert and oriented to person, place, and time.  Psychiatric:        Behavior: Behavior is cooperative.        Thought Content: Thought content normal.    Review of Systems  Psychiatric/Behavioral:  Positive for hallucinations and substance abuse.   All other systems reviewed and are negative.  Blood pressure 129/84, pulse 93, temperature 98.3 F (36.8 C), temperature source Oral, resp. rate 16, SpO2 98 %. There is no height or weight on file to calculate BMI.  Medical Decision Making: Patient case reviewed and discussed with Dr. Dwyane Dee.  Patient does not meet criteria for inpatient psychiatric treatment or IVC.  There is concern for secondary gain of housing.  Patient expressed understanding of navigating resources left for him in AVS, and the importance of trying to maintain sobriety.  Patient is able to contract for safety and agreeable to discharge.  - Pt instructed to return to [facility] on 08/21/2022 for second dose of invega sustenna  LAI.   Disposition: No evidence of imminent risk to self or others at present.   Patient does not meet criteria for psychiatric inpatient admission. Supportive therapy provided about ongoing stressors. Discussed crisis plan, support from social network, calling 911, coming to the Emergency Department, and calling Suicide Hotline.  Vesta Mixer, NP 08/19/2022 10:48 AM

## 2022-08-19 NOTE — ED Provider Notes (Signed)
Patient cleared by psychiatry.  He is in no acute distress.  Instructed to follow-up with [place] in 2 days   Sherwood Gambler, MD 08/19/22 1110

## 2022-08-19 NOTE — ED Notes (Signed)
PT belongings in locker 12, 1 bag. PT wanded by security

## 2022-08-19 NOTE — Discharge Instructions (Signed)
Please go to [facility] to receive second dose of Kirt Boys on Wednesday, 08/21/2022.     [place]  Hours Monday - Friday: Services: 8:00AM - 3:00PM Offices: 8:00AM - 5:00PM  Physical Address [place], [place] [address]   Please use this address for [facility] Mailing Address [address] [place], [place] [address]  The [facility] helps people reconnect This is a safe place to rest, take care of basic needs and access the services and community that make all the difference. Our guests come to the [facility] to take a class, do laundry, meet with a case manager or to get their mail. Sometimes they just need to sit in our dayroom and enjoy a conversation.  Here you will find everything from shower facilities to a computer lab, a mail room, classrooms and meeting spaces.  The [facility] helps people reconnect with their own lives and with the community at large.  A caring community setting One of the most exciting aspects of the [facility] is that so many individuals and organizations in the community are a part of the everyday experience. Whether it's a hair stylist or law firm offering services right in-house, our partners make the [facility] a truly interactive resource center where services are brought to our guests. The [facility] brings together a comprehensive community of talented people who not only want to help solve problems, but also to be a part of our guests' lives.  Integrated Care We take a person-centered approach to assistance that includes: Case management [facility] Mental health nurse Referrals  Fundamental Services We start with necessities: Engineer, maintenance (IT) and Risk manager addresses and mailboxes Replacement IDs Onsite barbershop Storage lockers [place] winter warming center  Self-Sufficiency We connect our guests with: Skilled trade classes Job skills classes Resume and  jobs application assistance Interview training GED Advertising copywriter

## 2022-08-19 NOTE — ED Provider Notes (Signed)
[facility] EMERGENCY DEPARTMENT Provider Note   CSN: [id number] Arrival date & time: 08/19/22  6734     History  Chief Complaint  Patient presents with   Suicidal    Tyler White is a 26 y.o. male.  26 year old male presents to the emergency department for complaint of command hallucinations. States he is hearing voices telling him to kill himself. No plan expressed. Reports his hallucinations have been present x 4 years and are worse when using drugs; smoked crack yesterday.   The history is provided by the patient. No language interpreter was used.       Home Medications Prior to Admission medications   Medication Sig Start Date End Date Taking? Authorizing Provider  paliperidone (INVEGA SUSTENNA) 234 MG/1.5ML SUSY injection Inject 234 mg into the muscle once for 1 dose. Patient not taking: Reported on 06/28/2022 04/16/22 06/28/22  Tharon Aquas, NP  gabapentin (NEURONTIN) 400 MG capsule Take 1 capsule (400 mg total) by mouth 3 (three) times daily. Patient not taking: Reported on 06/09/2021 04/30/21 06/10/21  Ethelene Hal, NP      Allergies    Patient has no known allergies.    Review of Systems   Review of Systems Ten systems reviewed and are negative for acute change, except as noted in the HPI.    Physical Exam Updated Vital Signs BP 129/84 (BP Location: Right Arm)   Pulse 93   Temp 98.3 F (36.8 C) (Oral)   Resp 16   SpO2 98%   Physical Exam Vitals and nursing note reviewed.  Constitutional:      General: He is not in acute distress.    Appearance: He is well-developed. He is not diaphoretic.     Comments: Nontoxic appearing and in NAD  HENT:     Head: Normocephalic and atraumatic.  Eyes:     General: No scleral icterus.    Conjunctiva/sclera: Conjunctivae normal.  Pulmonary:     Effort: Pulmonary effort is normal. No respiratory distress.  Musculoskeletal:        General: Normal range of motion.     Cervical back:  Normal range of motion.  Skin:    General: Skin is warm and dry.     Coloration: Skin is not pale.     Findings: No erythema or rash.  Neurological:     Mental Status: He is alert and oriented to person, place, and time.  Psychiatric:        Mood and Affect: Mood is depressed. Affect is flat.        Speech: Speech normal.        Behavior: Behavior is uncooperative.        Thought Content: Thought content does not include homicidal ideation.     Comments: Apathetic. Not reacting to internal stimuli.     ED Results / Procedures / Treatments   Labs (all labs ordered are listed, but only abnormal results are displayed) Labs Reviewed  RESP PANEL BY RT-PCR (FLU A&B, COVID) ARPGX2  CBC WITH DIFFERENTIAL/PLATELET  COMPREHENSIVE METABOLIC PANEL  ETHANOL  RAPID URINE DRUG SCREEN, HOSP PERFORMED    EKG None  Radiology No results found.  Procedures Procedures    Medications Ordered in ED Medications - No data to display  ED Course/ Medical Decision Making/ A&P                           Medical Decision Making Amount and/or Complexity  of Data Reviewed Labs: ordered.   Patient presenting to the emergency department with complaint of command hallucinations.  TTS consultation placed.  Disposition per psychiatric recommendations.  Care to be assumed by oncoming ED provider.        Final Clinical Impression(s) / ED Diagnoses Final diagnoses:  History of command hallucinations  Crack cocaine use    Rx / DC Orders ED Discharge Orders     None         Antonietta Breach, PA-C 08/19/22 5732    Ripley Fraise, MD 08/19/22 [phone]

## 2022-08-19 NOTE — ED Notes (Signed)
Psych at [facility]

## 2022-08-19 NOTE — ED Triage Notes (Signed)
Pt reports on going suicidal thoughts. Pt states, " he is hearing voices". Pt also endorses using cocaine in the last 24 hours.  HX: SI

## 2022-08-19 NOTE — ED Notes (Signed)
Preparing d/c process, EDP in to see, at [facility].

## 2022-08-19 NOTE — ED Provider Triage Note (Signed)
Emergency Medicine Provider Triage Evaluation Note  Tyler White , a 26 y.o. male  was evaluated in triage.  Pt complains of command hallucinations. States he is hearing voices telling him to kill himself. No plan expressed. Reports his hallucinations have been present x 4 years and are worse when using drugs; smoked crack yesterday.   Review of Systems  Positive: As above Negative: As above  Physical Exam  BP 129/84 (BP Location: Right Arm)   Pulse 93   Temp 98.3 F (36.8 C) (Oral)   Resp 16   SpO2 98%  Gen:   Awake, no distress   Resp:  Normal effort  MSK:   Moves extremities without difficulty  Other:  Flat affect. Uncooperative.   Medical Decision Making  Medically screening exam initiated at 5:51 AM.  Appropriate orders placed.  Tyler White was informed that the remainder of the evaluation will be completed by another provider, this initial triage assessment does not replace that evaluation, and the importance of remaining in the ED until their evaluation is complete.  Command hallucinations   Antonietta Breach, [place] 08/19/22 [phone]

## 2022-08-19 NOTE — ED Notes (Signed)
To 48 from triage-w/r, alert, NAD, calm.

## 2022-08-19 NOTE — ED Provider Notes (Signed)
Emergency Medicine Observation Re-evaluation Note  Tyler White is a 26 y.o. male, seen on rounds today.  Pt initially presented to the ED for complaints of Suicidal Currently, the patient is resting comfortably.  Physical Exam  BP 129/84 (BP Location: Right Arm)   Pulse 93   Temp 98.3 F (36.8 C) (Oral)   Resp 16   SpO2 98%  Physical Exam General: NAD   ED Course / MDM  EKG:   I have reviewed the labs performed to date as well as medications administered while in observation.  Recent changes in the last 24 hours include no acute events reported.  Plan  Current plan is for TTS eval.  Patient is medically clear for psychiatric/TTS eval.   [name] is not under involuntary commitment.     Valarie Merino, MD 08/19/22 [phone]

## 2022-08-21 ENCOUNTER — Encounter (HOSPITAL_COMMUNITY): Payer: Self-pay | Admitting: Emergency Medicine

## 2022-08-21 ENCOUNTER — Other Ambulatory Visit: Payer: Self-pay

## 2022-08-21 ENCOUNTER — Emergency Department (HOSPITAL_COMMUNITY)
Admission: EM | Admit: 2022-08-21 | Discharge: 2022-08-21 | Disposition: A | Discharge status: Home / Self Care | Payer: Medicaid Other | Attending: Emergency Medicine | Admitting: Emergency Medicine

## 2022-08-21 ENCOUNTER — Emergency Department (HOSPITAL_COMMUNITY): Payer: Medicaid Other

## 2022-08-21 DIAGNOSIS — R45851 Suicidal ideations: Secondary | ICD-10-CM | POA: Diagnosis not present

## 2022-08-21 DIAGNOSIS — M79675 Pain in left toe(s): Secondary | ICD-10-CM | POA: Diagnosis not present

## 2022-08-21 DIAGNOSIS — F1410 Cocaine abuse, uncomplicated: Secondary | ICD-10-CM | POA: Diagnosis present

## 2022-08-21 DIAGNOSIS — W228XXA Striking against or struck by other objects, initial encounter: Secondary | ICD-10-CM | POA: Diagnosis not present

## 2022-08-21 DIAGNOSIS — Z046 Encounter for general psychiatric examination, requested by authority: Secondary | ICD-10-CM | POA: Diagnosis present

## 2022-08-21 MED ORDER — IBUPROFEN 400 MG PO TABS
600.0000 mg | ORAL_TABLET | Freq: Once | ORAL | Status: AC
  Administered 2022-08-21: 600 mg via ORAL
  Filled 2022-08-21: qty 1

## 2022-08-21 NOTE — ED Notes (Signed)
Pt changed into scrubs, wonded by security and belonings placed in locker #6

## 2022-08-21 NOTE — Discharge Instructions (Addendum)
Diagnosed with left toe pain. Recommend follow up with PCP in the next few days to assess wound healing. Recommend tylenol/ibuprofen. Regarding suicidal thoughts, per psych you are appropriate and safe to discharge home and follow up with them outpatient.

## 2022-08-21 NOTE — ED Provider Notes (Signed)
[facility] Emergency Department Provider Note MRN:  [id number]  Arrival date & time: 08/21/22     Chief Complaint   Toe Pain and Suicidal   History of Present Illness   Tyler White is a 26 y.o. year-old male presents to the ED with chief complaint of toe pain. Pain is located in left great toe. Reports that he "stubbed his toe on a rock" and noticed that his toenail was dislodged. He pulled off toe nail. Endorses normal range of motion. Also reports SI. Was reluctant to share details regarding the thought but was emphatic that he did not have a plan.     Review of Systems  Pertinent positive and negative review of systems noted in HPI.    Physical Exam   Vitals:   08/21/22 0810 08/21/22 1233  BP: 116/67 120/65  Pulse: 66 60  Resp: 17 16  Temp: 98 F (36.7 C) 98 F (36.7 C)  SpO2: 99% 98%    CONSTITUTIONAL:  well-appearing, NAD NEURO:  Alert and oriented x 3, CN 3-12 grossly intact EYES:  eyes equal and reactive ENT/NECK:  Supple, no stridor CARDIO: regular rhythm, appears well-perfused PULM:  No respiratory distress, CTAB GI/GU:  non-distended, soft MSK/SPINE:  No gross deformities, no edema, moves all extremities. Toenail absent on first toe of left foot. Nailbed not actively bleeding.  SKIN:  no rash, atraumatic   *Additional and/or pertinent findings included in MDM below  Diagnostic and Interventional Summary    Labs Reviewed - No data to display  DG Toe Great Left  Final Result      Medications  ibuprofen (ADVIL) tablet 600 mg (600 mg Oral Given 08/21/22 4481)     Procedures  /  Critical Care Procedures  ED Course and Medical Decision Making  I have reviewed the triage vital signs, the nursing notes, and pertinent available records from the EMR.  Social Determinants Affecting Complexity of Care: Patient .   ED Course:  Patient presented for great toe pain of the left foot. On Exam, toe nail of the 1st toes of the  left foot was absent. ROM normal. Xray revealed swelling but no fracture. Patient did endorse SI but states that he does not have a plan. Patient has been medically cleared. Psych hold and consulted Psych for TTS. Cleared by psych for discharge.   Medical Decision Making Amount and/or Complexity of Data Reviewed External Data Reviewed: notes.    Details: Seen for SI on 08/19/22. Evaluated by psych who determined it was not necessary to admit him at that time Radiology: ordered and independent interpretation performed. Decision-making details documented in ED Course.    Details: No fracture.     Consultants: Consulted psych for TTS eval. Cleared for discharge.   Treatment and Plan: Emergency department workup does not suggest an emergent condition requiring admission or immediate intervention beyond  what has been performed at this time. The patient is safe for discharge and has  been instructed to return immediately for worsening symptoms, change in  symptoms or any other concerns    Final Clinical Impressions(s) / ED Diagnoses     ICD-10-CM   1. Pain of left great toe  M79.675     2. Suicidal thoughts  R45.851       ED Discharge Orders     None              Lindell Spar 08/21/22 1355    [name], MD 08/22/22 [phone]

## 2022-08-21 NOTE — Consult Note (Cosign Needed Addendum)
[facility] Psych ED Discharge  08/21/2022 1:43 PM Tyler White  MRN:  [id number]  Method of visit?: Face to Face   Principal Problem: Cocaine abuse, continuous use [facility]) Discharge Diagnoses: Principal Problem:   Cocaine abuse, continuous use ([place])   Subjective: Tyler White is 26 year old [place] American male that is well know to this services.  Patient observed resting in bed.  States he has a follow-up appointment at [facility] urgent care facility for his monthly injection however, has not been able to find transportation to make it to his appointment.  Of note: chart reviewed "  Patient was given Lorayne Bender Sustenna 234 mg on 8/16."  There were no behavioral problems while the patient was admitted to the observation unit. He reports auditory hallucinations with substance abuse use. Ell in and evaluated and discharged  for similar concerns.  He is currently denying suicidal or homicidal ideations.  He remains focused on toe pain states he had a lot of walking to do.  Patient is asking for some time to rest and to get something to eat.  Discussed keeping all outpatient follow-up appointments.  Patient appeared receptive to plan.  Will recommend discharge with additional outpatient resources available to include interactive resource center.  Tyler White is lying down in no acute distress. He is alert/oriented x 3; calm/cooperative; and mood congruent with affect.  He is speaking in a clear tone at moderate volume, and normal pace; with good eye contact. His thought process is coherent and relevant; There is no indication that he is currently responding to internal/external stimuli or experiencing delusional thought content; and he has denied suicidal/self-harm/homicidal ideation, psychosis, and paranoia.  Patient has remained calm throughout assessment and has answered questions appropriately.    At this time Tyler White is educated and verbalizes understanding of mental health  resources and other crisis services in the community. He is instructed to call 911 and present to the nearest emergency room should he experience any suicidal/homicidal ideation, auditory/visual/hallucinations, or detrimental worsening of his mental health condition. He was a also advised by Probation officer that he could call the toll-free phone on insurance card to assist with identifying in network counselors and agencies or number on back of Medicaid card to speak with care coordinator.    Total Time spent with patient: 15 minutes  Past Psychiatric History:   Past Medical History:  Past Medical History:  Diagnosis Date   Hernia, inguinal, right    Inguinal hernia    right   Psychiatric illness    Schizophrenia ([place])    History reviewed. No pertinent surgical history. Family History:  Family History  Problem Relation Age of Onset   Psychiatric Illness Mother    Hypertension Sister    Family Psychiatric  History:  Social History:  Social History   Substance and Sexual Activity  Alcohol Use Not Currently     Social History   Substance and Sexual Activity  Drug Use Not Currently   Types: Marijuana, Cocaine   Comment: crack/cocaine occasionally    Social History   Socioeconomic History   Marital status: Single    Spouse name: Not on file   Number of children: Not on file   Years of education: 10   Highest education level: Not on file  Occupational History   Not on file  Tobacco Use   Smoking status: Some Days    Packs/day: 0.50    Years: 5.00    Total pack years: 2.50    Types:  Cigarettes   Smokeless tobacco: Never  Vaping Use   Vaping Use: Never used  Substance and Sexual Activity   Alcohol use: Not Currently   Drug use: Not Currently    Types: Marijuana, Cocaine    Comment: crack/cocaine occasionally   Sexual activity: Yes    Birth control/protection: None  Other Topics Concern   Not on file  Social History Narrative   ** Merged History Encounter **       **  Merged History Encounter **       Social Determinants of Health   Financial Resource Strain: Not on file  Food Insecurity: Not on file  Transportation Needs: Not on file  Physical Activity: Not on file  Stress: Not on file  Social Connections: Not on file    Tobacco Cessation:  N/A, patient does not currently use tobacco products  Current Medications: No current facility-administered medications for this encounter.   Current Outpatient Medications  Medication Sig Dispense Refill   paliperidone (INVEGA SUSTENNA) 156 MG/ML SUSY injection Inject 156 mg into the muscle every 30 (thirty) days.     paliperidone (INVEGA SUSTENNA) 234 MG/1.5ML SUSY injection Inject 234 mg into the muscle once for 1 dose. (Patient taking differently: Inject 234 mg into the muscle every 30 (thirty) days.) 1.5 mL 0   PTA Medications: (Not in a hospital admission)   Musculoskeletal: Strength & Muscle Tone: within normal limits Gait & Station: normal Patient leans: N/A  Psychiatric Specialty Exam:  Presentation  General Appearance: Appropriate for Environment  Eye Contact:Fair  Speech:Clear and Coherent  Speech Volume:Normal  Handedness:Right   Mood and Affect  Mood:Euthymic  Affect:Congruent   Thought Process  Thought Processes:Coherent  Descriptions of Associations:Intact  Orientation:Full (Time, Place and Person)  Thought Content:Logical  History of Schizophrenia/Schizoaffective disorder:Yes  Duration of Psychotic Symptoms:Greater than six months  Hallucinations:No data recorded Ideas of Reference:None  Suicidal Thoughts:No data recorded Homicidal Thoughts:No data recorded  Sensorium  Memory:Immediate Fair  Judgment:Fair  Insight:Fair   Executive Functions  Concentration:Fair  Attention Span:Fair  [place]   Psychomotor Activity  Psychomotor Activity:No data recorded  Assets  Assets:Communication Skills;  Physical Health; Resilience   Sleep  Sleep:No data recorded   Physical Exam: Physical Exam Vitals reviewed.  Cardiovascular:     Rate and Rhythm: Normal rate.  Neurological:     Mental Status: He is alert.  Psychiatric:        Mood and Affect: Mood normal.        Behavior: Behavior normal.        Thought Content: Thought content normal.    Review of Systems  Psychiatric/Behavioral:  Positive for depression and substance abuse. The patient is nervous/anxious.   All other systems reviewed and are negative.  Blood pressure 120/65, pulse 60, temperature 98 F (36.7 C), temperature source Oral, resp. rate 16, height '5\' 8"'$  (1.727 m), weight 63.5 kg, SpO2 98 %. Body mass index is 21.29 kg/m.   Demographic Factors:  Male  Loss Factors: Financial problems/change in socioeconomic status  Historical Factors: Family history of mental illness or substance abuse  Risk Reduction Factors:   Living with another person, especially a relative and Positive coping skills or problem solving skills  Continued Clinical Symptoms:  Severe Anxiety and/or Agitation Depression:   Comorbid alcohol abuse/dependence  Cognitive Features That Contribute To Risk:  Closed-mindedness    Suicide Risk:  Minimal: No identifiable suicidal ideation.  Patients presenting with no risk factors but  with morbid ruminations; may be classified as minimal risk based on the severity of the depressive symptoms    Plan Of Care/Follow-up recommendations:  Activity:  as tolerated Diet:  Heart Healthy   Disposition: Take all medications as prescribed. Keep all follow-up appointments as scheduled.  Do not consume alcohol or use illegal drugs while on prescription medications. Report any adverse effects from your medications to your primary care provider promptly.  In the event of recurrent symptoms or worsening symptoms, call 911, a crisis hotline, or go to the nearest emergency department for evaluation.      Derrill Center, NP 08/21/2022, 1:43 PM

## 2022-08-21 NOTE — ED Triage Notes (Signed)
Per EMS, pt's left big toe nail is completely gone.  Reports sore and throbbing.  Pt initially wanted to go to [facility].  States he has been having suicidal thoughts "for weeks" w/o a plan.   118/84 Hr 88 RR 18 98% RA CBG 108

## 2022-08-21 NOTE — ED Provider Triage Note (Signed)
Emergency Medicine Provider Triage Evaluation Note  Tyler White , a 26 y.o. male  was evaluated in triage.  Pt complains of left great toe injury. Patient reports he stubbed his tone then ripped his toenail off. Area is painful. Also reports SI and hallucinations that have continued. .  Review of Systems  Per above.   Physical Exam  BP 105/65 (BP Location: Right Arm)   Pulse 85   Temp 98.2 F (36.8 C) (Oral)   Resp 15   Ht '5\' 8"'$  (1.727 m)   Wt 63.5 kg   SpO2 96%   BMI 21.29 kg/m  Gen:   Awake, no distress   Resp:  Normal effort  MSK:   Moves extremities without difficulty  Other:  Nail absent to left great toe  Medical Decision Making  Medically screening exam initiated at 3:58 AM.  Appropriate orders placed.  Flemon Kelty was informed that the remainder of the evaluation will be completed by another provider, this initial triage assessment does not replace that evaluation, and the importance of remaining in the ED until their evaluation is complete.  [place] up to date on chart review.   Xray ordered.    Milika Ventress R, PA-C 08/21/22 0400

## 2022-08-22 ENCOUNTER — Emergency Department (HOSPITAL_COMMUNITY)
Admission: EM | Admit: 2022-08-22 | Discharge: 2022-08-22 | Disposition: A | Discharge status: Home / Self Care | Payer: Medicaid Other | Attending: Emergency Medicine | Admitting: Emergency Medicine

## 2022-08-22 ENCOUNTER — Emergency Department (HOSPITAL_COMMUNITY)
Admission: EM | Admit: 2022-08-22 | Discharge: 2022-08-22 | Disposition: A | Discharge status: Home / Self Care | Payer: Medicaid Other | Source: Home / Self Care

## 2022-08-22 ENCOUNTER — Other Ambulatory Visit: Payer: Self-pay

## 2022-08-22 DIAGNOSIS — Z5900 Homelessness unspecified: Secondary | ICD-10-CM | POA: Insufficient documentation

## 2022-08-22 DIAGNOSIS — Y907 Blood alcohol level of 200-239 mg/100 ml: Secondary | ICD-10-CM | POA: Insufficient documentation

## 2022-08-22 DIAGNOSIS — M79675 Pain in left toe(s): Secondary | ICD-10-CM | POA: Diagnosis not present

## 2022-08-22 DIAGNOSIS — F1910 Other psychoactive substance abuse, uncomplicated: Secondary | ICD-10-CM

## 2022-08-22 DIAGNOSIS — R1110 Vomiting, unspecified: Secondary | ICD-10-CM | POA: Diagnosis not present

## 2022-08-22 DIAGNOSIS — F10929 Alcohol use, unspecified with intoxication, unspecified: Secondary | ICD-10-CM | POA: Insufficient documentation

## 2022-08-22 LAB — I-STAT CHEM 8, ED
BUN: 13 mg/dL (ref 6–20)
Calcium, Ion: 0.99 mmol/L — ABNORMAL LOW (ref 1.15–1.40)
Chloride: 105 mmol/L (ref 98–111)
Creatinine, Ser: 1.1 mg/dL (ref 0.61–1.24)
Glucose, Bld: 94 mg/dL (ref 70–99)
HCT: 36 % — ABNORMAL LOW (ref 39.0–52.0)
Hemoglobin: 12.2 g/dL — ABNORMAL LOW (ref 13.0–17.0)
Potassium: 4 mmol/L (ref 3.5–5.1)
Sodium: 139 mmol/L (ref 135–145)
TCO2: 26 mmol/L (ref 22–32)

## 2022-08-22 LAB — ETHANOL: Alcohol, Ethyl (B): 204 mg/dL — ABNORMAL HIGH (ref <10)

## 2022-08-22 LAB — RAPID URINE DRUG SCREEN, HOSP PERFORMED
Amphetamines: NOT DETECTED
Barbiturates: NOT DETECTED
Benzodiazepines: NOT DETECTED
Cocaine: POSITIVE — AB
Opiates: NOT DETECTED
Tetrahydrocannabinol: NOT DETECTED

## 2022-08-22 MED ORDER — LACTATED RINGERS IV BOLUS: 1000.0000 mL | Freq: Once | INTRAVENOUS | Status: DC

## 2022-08-22 NOTE — ED Provider Notes (Signed)
[facility] EMERGENCY DEPARTMENT Provider Note   CSN: [id number] Arrival date & time: 08/22/22  1125     History  Chief Complaint  Patient presents with  . Alcohol Intoxication    Tyler White is a 26 y.o. male.  HPI  26 year old male presents today complaining of alcohol and THC use.  He vomited prior to arrival.  He initially denied any drug use, but now admits to cocaine use.  He denies any pain, injury, suicidal, homicidal ideation    Home Medications Prior to Admission medications   Medication Sig Start Date End Date Taking? Authorizing Provider  paliperidone (INVEGA SUSTENNA) 156 MG/ML SUSY injection Inject 156 mg into the muscle every 30 (thirty) days.    [provider]  paliperidone (INVEGA SUSTENNA) 234 MG/1.5ML SUSY injection Inject 234 mg into the muscle once for 1 dose. Patient taking differently: Inject 234 mg into the muscle every 30 (thirty) days. 04/16/22 08/21/22  Tharon Aquas, NP  gabapentin (NEURONTIN) 400 MG capsule Take 1 capsule (400 mg total) by mouth 3 (three) times daily. Patient not taking: Reported on 06/09/2021 04/30/21 06/10/21  Ethelene Hal, NP      Allergies    Patient has no known allergies.    Review of Systems   Review of Systems  Physical Exam Updated Vital Signs BP 120/77 (BP Location: Left Arm)   Pulse 65   Temp 98.3 F (36.8 C) (Oral)   Resp 15   SpO2 100%  Physical Exam Vitals and nursing note reviewed.  Constitutional:      Appearance: He is well-developed.  HENT:     Head: Normocephalic and atraumatic.     Right Ear: External ear normal.     Left Ear: External ear normal.     Nose: Nose normal.  Eyes:     Extraocular Movements: Extraocular movements intact.  Neck:     Trachea: No tracheal deviation.  Cardiovascular:     Rate and Rhythm: Normal rate and regular rhythm.  Pulmonary:     Effort: Pulmonary effort is normal.  Abdominal:     General: Abdomen is flat.      Palpations: Abdomen is soft.  Musculoskeletal:        General: Normal range of motion.  Skin:    General: Skin is warm and dry.  Neurological:     Mental Status: He is alert and oriented to person, place, and time.  Psychiatric:        Mood and Affect: Mood normal.        Behavior: Behavior normal.     ED Results / Procedures / Treatments   Labs (all labs ordered are listed, but only abnormal results are displayed) Labs Reviewed  ETHANOL - Abnormal; Notable for the following components:      Result Value   Alcohol, Ethyl (B) 204 (*)    All other components within normal limits  RAPID URINE DRUG SCREEN, HOSP PERFORMED - Abnormal; Notable for the following components:   Cocaine POSITIVE (*)    All other components within normal limits  I-STAT CHEM 8, ED - Abnormal; Notable for the following components:   Calcium, Ion 0.99 (*)    Hemoglobin 12.2 (*)    HCT 36.0 (*)    All other components within normal limits    EKG None  Radiology DG Toe Great Left  Result Date: 08/21/2022 CLINICAL DATA:  Left great toe trauma. EXAM: LEFT GREAT TOE COMPARISON:  None Available. FINDINGS: There is  no evidence of fracture or dislocation. There is no evidence of arthropathy or other focal bone abnormality. There is soft tissue swelling, and the toenail appears to be missing. IMPRESSION: Soft tissue swelling without evidence of fractures. Electronically Signed   By: Telford Nab M.D.   On: 08/21/2022 04:24    Procedures Procedures    Medications Ordered in ED Medications  lactated ringers bolus 1,000 mL (has no administration in time range)    ED Course/ Medical Decision Making/ A&P Clinical Course as of 08/22/22 1459  Thu Aug 22, 2022  1458 I-STAT reviewed interpreted within normal limits Alcohol is less elevated at 204 Urine drug screen is positive for cocaine [DR]    Clinical Course User Index [DR] Pattricia Boss, MD                           Medical Decision  Making 26 year old male presents today with report of intoxication and episode of vomiting.  Here in the ED he is evaluated with labs and urine drug screen.  Alcohol is elevated at 204.  Urine drug screen is positive for cocaine. Patient has eaten a meal. I have reevaluated him.  He denies any acute problems at this time.  Specifically denies any suicidal or homicidal ideation or psychosis He is requesting more food and discharged home.   Amount and/or Complexity of Data Reviewed Labs: ordered. Decision-making details documented in ED Course.            Final Clinical Impression(s) / ED Diagnoses Final diagnoses:  Polysubstance abuse [facility])    Rx / DC Orders ED Discharge Orders     None         Pattricia Boss, MD 08/22/22 1459

## 2022-08-22 NOTE — Discharge Instructions (Signed)
You may take over-the-counter Tylenol or ibuprofen as needed for your toe pain.  Use resource below to find shelter.

## 2022-08-22 NOTE — ED Triage Notes (Signed)
Pt bib [facility] found laying on the side walk. Pt admits to drinking alcohol and smoking weed. Pt vomited prior to arrival. Denies any other drug use. Last drink 2 hours ago.   EMS vitals 110/80 BP 102 CBG 90 HR 97% O2

## 2022-08-22 NOTE — ED Provider Notes (Signed)
[facility] EMERGENCY DEPARTMENT Provider Note   CSN: [id number] Arrival date & time: 08/22/22  1847     History  Chief Complaint  Patient presents with   Suicidal   Toe Pain    Tyler White is a 26 y.o. male.  The history is provided by the patient and medical records. No language interpreter was used.  Toe Pain     25 year old male significant history of homelessness, polysubstance use, schizoaffective disorder, presenting complaining of toe pain.  Patient states he hit his left great toe and the toenail came off several days ago.  He was seen yesterday for similar symptoms.  States his toe was wrapped and he was given food and was discharged home.  He returns today stating that he is hungry and would like some food.  Initially told the staff that he was suicidal and plan to drink himself to death.  However patient states he is just hungry and wanting some food.  He denies worsening left toe pain.  Home Medications Prior to Admission medications   Medication Sig Start Date End Date Taking? Authorizing Provider  paliperidone (INVEGA SUSTENNA) 156 MG/ML SUSY injection Inject 156 mg into the muscle every 30 (thirty) days.    [provider]  paliperidone (INVEGA SUSTENNA) 234 MG/1.5ML SUSY injection Inject 234 mg into the muscle once for 1 dose. Patient taking differently: Inject 234 mg into the muscle every 30 (thirty) days. 04/16/22 08/21/22  Tharon Aquas, NP  gabapentin (NEURONTIN) 400 MG capsule Take 1 capsule (400 mg total) by mouth 3 (three) times daily. Patient not taking: Reported on 06/09/2021 04/30/21 06/10/21  Ethelene Hal, NP      Allergies    Patient has no known allergies.    Review of Systems   Review of Systems  All other systems reviewed and are negative.   Physical Exam Updated Vital Signs BP 112/84   Pulse 93   Temp 98.3 F (36.8 C) (Oral)   Resp 16   SpO2 100%  Physical Exam Vitals and nursing note  reviewed.  Constitutional:      General: He is not in acute distress.    Appearance: He is well-developed.  HENT:     Head: Atraumatic.  Eyes:     Conjunctiva/sclera: Conjunctivae normal.  Musculoskeletal:        General: Tenderness (Left foot: Missing toenail on the left great toe without signs of infection.  Mild tenderness to palpation) present.     Cervical back: Neck supple.  Skin:    Findings: No rash.  Neurological:     Mental Status: He is alert.  Psychiatric:        Speech: Speech normal.        Behavior: Behavior is cooperative.        Thought Content: Thought content does not include homicidal or suicidal ideation.     ED Results / Procedures / Treatments   Labs (all labs ordered are listed, but only abnormal results are displayed) Labs Reviewed - No data to display  EKG None  Radiology DG Toe Great Left  Result Date: 08/21/2022 CLINICAL DATA:  Left great toe trauma. EXAM: LEFT GREAT TOE COMPARISON:  None Available. FINDINGS: There is no evidence of fracture or dislocation. There is no evidence of arthropathy or other focal bone abnormality. There is soft tissue swelling, and the toenail appears to be missing. IMPRESSION: Soft tissue swelling without evidence of fractures. Electronically Signed   By: Telford Nab  M.D.   On: 08/21/2022 04:24    Procedures Procedures    Medications Ordered in ED Medications - No data to display  ED Course/ Medical Decision Making/ A&P                           Medical Decision Making  BP 112/84   Pulse 93   Temp 98.3 F (36.8 C) (Oral)   Resp 16   SpO2 100%   7:52 PM This is a 26 year old male significant history of schizophrenia, malingering, homelessness, presenting requesting for food to eat.  He report he injured his left great toe several days ago and lost his toenails.  He was seen in the ED yesterday for same complaint and has his toe wrapped.  He was given food to eat.  He returns today stating that he would  like something to eat.  Initially endorsed SI but now denies.  No specific plan to harm himself.  I have reviewed patient's EMR including prior labs and imaging and considered my plan of care.  After further evaluation, I felt patient does not warrant further psychiatric eval as his primary motivation is requesting for food.  I have agreed to provide patient with some food and will provide resources.  Encourage patient to seek outpatient help and use resources provided.  Return precaution recommended.  I have considered patient's social determinant of health which includes depression, tobacco abuse, homelessness, and my plan of care.  I have viewed patient's previous x-ray of his left great toe which demonstrate some swelling without bony injury, I agrees with radiologist's interpretation.         Final Clinical Impression(s) / ED Diagnoses Final diagnoses:  Homelessness  Pain of left great toe    Rx / DC Orders ED Discharge Orders     None         [name], PA-C 08/22/22 1956    [name], MD 08/23/22 [phone]

## 2022-08-22 NOTE — ED Triage Notes (Signed)
Pt reported to ED with c/o pain to left big toe after toenail fell off today. Pt also states he is suicidal and would like to drink himself to death.

## 2022-08-22 NOTE — ED Notes (Signed)
In and Out cath completed by Toney Rakes, Paramedic and [place], [place]. Catheter bag held 55m.

## 2022-08-28 ENCOUNTER — Ambulatory Visit (HOSPITAL_COMMUNITY)
Admission: EM | Admit: 2022-08-28 | Discharge: 2022-08-28 | Disposition: A | Discharge status: Home / Self Care | Payer: Medicaid Other | Attending: Psychiatry | Admitting: Psychiatry

## 2022-08-28 DIAGNOSIS — F250 Schizoaffective disorder, bipolar type: Secondary | ICD-10-CM | POA: Insufficient documentation

## 2022-08-28 DIAGNOSIS — F1410 Cocaine abuse, uncomplicated: Secondary | ICD-10-CM | POA: Insufficient documentation

## 2022-08-28 DIAGNOSIS — R45851 Suicidal ideations: Secondary | ICD-10-CM | POA: Insufficient documentation

## 2022-08-28 DIAGNOSIS — F1210 Cannabis abuse, uncomplicated: Secondary | ICD-10-CM | POA: Insufficient documentation

## 2022-08-28 NOTE — ED Provider Notes (Signed)
Behavioral Health Urgent Care Medical Screening Exam  Patient Name: Tyler White MRN: [id number] Date of Evaluation: 08/28/22 Chief Complaint:   Diagnosis:  Final diagnoses:  Cocaine abuse, continuous use ([place])    History of Present illness: Tyler White is a 26 y.o. male. Patient states "I just wanted someone to talk to, I feel better now, like I can cope."   [name] reports chronic stressors include "not being a part of my 12-year-old daughter the way I would like to."  Patient is insightful today, reveals he rarely sees his daughter related to his cocaine use.  He endorses plan to follow-up with outpatient counseling and decreased cocaine use.  He endorses daily cocaine use, most recent cocaine use on yesterday.  He also endorses daily alcohol use, most recent alcohol use on yesterday.  He typically consumes two 12 ounce beers per day.  He would like substance use treatment resources, he declines residential substance use treatment at this time.  Patient presents voluntarily to [facility] voluntarily, transported by Event organiser. He reports desire to follow up with outpatient counseling moving forward.   Tyler White is assessed, face-to-face, by nurse practitioner.  He is seated in assessment area, no apparent distress.  He is alert and oriented, pleasant and cooperative during assessment.  He presents with euthymic mood, congruent affect.  He denies suicidal and homicidal ideations.  He easily contracts verbally for safety with this Probation officer.  He denies auditory visual hallucinations.  There is no evidence of delusional thought content and no indication that patient is responding to internal stimuli.  He denies symptoms of paranoia.  Patient has been diagnosed with cannabis use disorder, schizophrenia, psychosis, cocaine use disorder, polysubstance abuse, schizoaffective disorder, bipolar type, marijuana use disorder, suicidal thoughts and auditory hallucinations.   He is followed by outpatient psychiatry at [facility] behavioral health.  He reports he is compliant with long-acting injectable medication, Invega.  Reports plan to receive upcoming Invega on approximately 09/14/2022.  Additionally he reports plan to follow-up with outpatient counseling at [facility] behavioral health outpatient.  He endorses history of multiple inpatient psychiatric hospitalizations.  No family mental health history reported.  Tyler White resides in an air B&B in [place].  He denies access to weapons.  He is not currently employed.  He endorses average sleep and appetite.  Patient offered support and encouragement.  He gives verbal consent to speak with his daughter's mother, Risa Grill phone number [phone].  Unable to leave voicemail at this number.   Patient educated and verbalizes understanding of mental health resources and other crisis services in the community. They are instructed to call 911 and present to the nearest emergency room should patient experience any suicidal/homicidal ideation, auditory/visual/hallucinations, or detrimental worsening of mental health condition.     Psychiatric Specialty Exam  Presentation  General Appearance:Appropriate for Environment; Casual  Eye Contact:Good  Speech:Clear and Coherent; Normal Rate  Speech Volume:Normal  Handedness:Right   Mood and Affect  Mood:Euthymic  Affect:Appropriate; Congruent   Thought Process  Thought Processes:Coherent; Goal Directed; Linear  Descriptions of Associations:Intact  Orientation:Full (Time, Place and Person)  Thought Content:Logical; WDL  Diagnosis of Schizophrenia or Schizoaffective disorder in past: Yes   Hallucinations:None NA "I hear voices, I dont know what they say" "like dark circles and stuff".  Ideas of Reference:None  Suicidal Thoughts:No Without Plan Without Plan  Homicidal Thoughts:No   Sensorium  Memory:Immediate Good; Recent  Fair  Judgment:Fair  Insight:Fair   Executive Functions  Concentration:Good  Attention Span:Good  [place]  Language:Good   Psychomotor Activity  Psychomotor Activity:Normal -- (NA) No   Assets  Assets:Communication Skills; Desire for Improvement; Financial Resources/Insurance; Leisure Time; Physical Health; Social Support; Resilience   Sleep  Sleep:Fair  Number of hours: 3   No data recorded  Physical Exam: Physical Exam Vitals and nursing note reviewed.  Constitutional:      Appearance: Normal appearance. He is well-developed and normal weight.  HENT:     Head: Normocephalic and atraumatic.     Nose: Nose normal.  Cardiovascular:     Rate and Rhythm: Normal rate and regular rhythm.  Pulmonary:     Effort: Pulmonary effort is normal.     Breath sounds: Normal breath sounds.  Musculoskeletal:        General: Normal range of motion.     Cervical back: Normal range of motion.  Skin:    General: Skin is warm and dry.  Neurological:     Mental Status: He is alert and oriented to person, place, and time.  Psychiatric:        Attention and Perception: Attention and perception normal.        Mood and Affect: Mood and affect normal.        Speech: Speech normal.        Behavior: Behavior normal. Behavior is cooperative.        Thought Content: Thought content normal.        Cognition and Memory: Cognition and memory normal.    Review of Systems  Constitutional: Negative.   HENT: Negative.    Eyes: Negative.   Respiratory: Negative.    Cardiovascular: Negative.   Gastrointestinal: Negative.   Genitourinary: Negative.   Musculoskeletal: Negative.   Skin: Negative.   Neurological: Negative.   Psychiatric/Behavioral:  Positive for substance abuse.    Blood pressure 105/73, pulse 93, temperature 98.4 F (36.9 C), temperature source Oral, resp. rate 18, height 6' (1.829 m), weight 179 lb (81.2 kg), SpO2 100 %. Body mass  index is 24.28 kg/m.  Musculoskeletal: Strength & Muscle Tone: within normal limits Gait & Station: normal Patient leans: N/A   [facility] MSE Discharge Disposition for Follow up and Recommendations: Patient reviewed with Dr. Hampton Abbot. Based on my evaluation the patient does not appear to have an emergency medical condition and can be discharged with resources and follow up care in outpatient services for Medication Management, Substance Abuse Intensive Outpatient Program, and Individual Therapy Follow-up with outpatient psychiatry at [facility] behavioral health.  Continue current medications. Follow-up with substance use treatment resources provided.  Lucky Rathke, FNP 08/28/2022, 3:57 PM

## 2022-08-28 NOTE — BH Assessment (Signed)
LCSW Progress Note   Per Beatriz Stallion, FNP, this pt does not require psychiatric hospitalization at this time.  Pt is psychiatrically cleared.  Discharge instructions includes follow-up appointment with [facility] on 12 September 2022 @ 1100 for his injection.  EDP Beatriz Stallion, FNP, has been notified.   Omelia Blackwater, MSW, [place] [phone] or [phone]

## 2022-08-28 NOTE — BH Assessment (Addendum)
Comprehensive Clinical Assessment (CCA) Screening, Triage and Referral Note  08/28/2022 Tyler White [id number]  Triage/Screening completed. Patient is Routine. Currently in room 143. MSE pending.      Chief Complaint: Chronic depression and suicidal ideations. Substance Use. Homelessness.  Visit Diagnosis:  Schizoaffective disorder, bipolar type ([name])  Homelessness  Noncompliance with medications Methamphetamine Use   Cocaine abuse with cocaine-induced mood disorder ([name])  Crack cocaine use    Patient Reported Information How did you hear about [place]? Legal System  What Is the Reason for Your Visit/Call Today?   Tyler White is a 26 y.o. male with psychiatric history of schizoaffective disorder, bipolar type, cocaine abuse, noncompliance, malingering, homelessness. He presents to the [facility] via [facility] (voluntary). He is requesting a 2-day admission "to get my self together, get a haircut, and find a job".       Today, patient was at the [employer] on [address] speaking with the manager whom he subsequently knows. He told the manager that his is currently homeless, depressed, and has frequent suicidal thoughts. The manager called police to assist patient with getting help. He reports chronic and intermittent suicidal thoughts since the age of 26 years old. During that time, he also separated from the mother of his now 29-year-old child, in which patient says his mental health issues began.   Patient reports "mild suicidal thoughts", currently. No plan and no intent. Contract for safety and identifies protective factors (mother, brothers, daughter). No access to means. No prior attempts. Denies HI. Chronic history of daily AVH's (no command type). He reported use of crack cocaine and methamphetamine. He uses crack cocaine every other day and Last use of crack cocaine was yesterday.  He uses methamphetamine 1-2 times per week and last use was early June 2023.        Presented to the [facility] ED for similar complaints as noted above 24x's in the past 6 months and the last visit was 08/22/22.  Approximately, 15 visits to the [facility] and the last visit was 05/13/2022. Admitted to [facility] 01/29/2022, 12/12/21, 10/08/21, 04/23/2021, 11/19/2020 and 14/14/22.    How Long Has This Been Causing You Problems? > than 6 months  What Do You Feel Would Help You the Most Today? Alcohol or Drug Use Treatment; Treatment for Depression or other mood problem; Medication(s)   Have You Recently Had Any Thoughts About Hurting Yourself? Yes  Are You Planning to Commit Suicide/Harm Yourself At This time? Yes   Have you Recently Had Thoughts About Hurting Someone [name]? No  Are You Planning to Harm Someone at This Time? No  Explanation: No data recorded  Have You Used Any Alcohol or Drugs in the Past 24 Hours? Yes  How Long Ago Did You Use Drugs or Alcohol? No data recorded What Did You Use and How Much? He reported use of crack cocaine and methamphetamine. He uses crack cocaine every other day and Last use of crack cocaine was yesterday.  He uses methamphetamine 1-2 times per week and last use was early June 2023.   Do You Currently Have a Therapist/Psychiatrist? No  Name of Therapist/Psychiatrist: No data recorded  Have You Been Recently Discharged From Any Office Practice or Programs? Yes  Explanation of Discharge From Practice/Program: Pt has had multiple visits to the [facility]    CCA Screening Triage Referral Assessment Type of Contact: Tele-Assessment  Telemedicine Service Delivery:   Is this Initial or Reassessment? Initial Assessment  Date Telepsych consult ordered in  [facility]:  06/28/22  Time Telepsych consult ordered in [facility]:  1305  Location of Assessment: [facility] ED  Provider Location: [facility] Assessment Services   Collateral Involvement: Pt reports, he has no supports to be contacted.   Does Patient Have a Stage manager Guardian? No data  recorded Name and Contact of Legal Guardian: No data recorded If Minor and Not Living with Parent(s), Who has Custody? N/A   Patient Determined To Be At Risk for Harm To Self or Others Based on Review of Patient Reported Information or Presenting Complaint? Yes, for Self-Harm  Method: none Availability of Means: none Intent: none  Notification Required: none Additional Information for Danger to Others Potential: none Additional Comments for Danger to Others Potential: none  Are There Guns or Other Weapons in [place]? none Types of Guns/Weapons: No data recorded Are These Weapons Safely Secured? N/A                            Who Could Verify You Are Able To Have These Secured: No data recorded Do You Have any Outstanding Charges, Pending Court Dates, Parole/Probation? No  Contacted To Inform of Risk of Harm To Self or Others: Event organiser; Family/Significant Other: (LEO and pt's family are aware)   Does Patient Present under Involuntary Commitment? No  IVC Papers Initial File Date: No data recorded  [place] of Residence: [place]   Patient Currently Receiving the Following Services: Not Receiving Services   Determination of Need: Routine (7 days)   Options For Referral: Medication Management; Facility-Based Crisis   Discharge Disposition: Pending MSE/providers evaluation.     Waldon Merl, Counselor

## 2022-08-28 NOTE — Discharge Instructions (Addendum)
Patient is instructed prior to discharge to:  Take all medications as prescribed by his/her mental healthcare provider. Report any adverse effects and or reactions from the medicines to his/her outpatient provider promptly. Keep all scheduled appointments, to ensure that you are getting refills on time and to avoid any interruption in your medication.  If you are unable to keep an appointment call to reschedule.  Be sure to follow-up with resources and follow-up appointments provided.  Patient has been instructed & cautioned: To not engage in alcohol and or illegal drug use while on prescription medicines. In the event of worsening symptoms, patient is instructed to call the crisis hotline, 911 and or go to the nearest ED for appropriate evaluation and treatment of symptoms. To follow-up with his/her primary care provider for your other medical issues, concerns and or health care needs.      You have an appointment for your Invega injection at [facility] on 12 September 2022 @ 11:00 am.  Tyler White will need to go to the second floor of the building and check in at the desk just as you step outside of the elevator.  Be sure to bring your insurance card and ID with you at the time of your appointment.  The address is listed as:  [place], [place], [address] [phone] phone

## 2022-09-02 ENCOUNTER — Encounter (HOSPITAL_COMMUNITY): Payer: Self-pay

## 2022-09-02 ENCOUNTER — Emergency Department (HOSPITAL_COMMUNITY)
Admission: EM | Admit: 2022-09-02 | Discharge: 2022-09-03 | Disposition: A | Discharge status: Home / Self Care | Payer: Medicaid Other | Attending: Emergency Medicine | Admitting: Emergency Medicine

## 2022-09-02 ENCOUNTER — Other Ambulatory Visit: Payer: Self-pay

## 2022-09-02 DIAGNOSIS — F1424 Cocaine dependence with cocaine-induced mood disorder: Secondary | ICD-10-CM | POA: Insufficient documentation

## 2022-09-02 DIAGNOSIS — Z20822 Contact with and (suspected) exposure to covid-19: Secondary | ICD-10-CM | POA: Insufficient documentation

## 2022-09-02 DIAGNOSIS — R45851 Suicidal ideations: Secondary | ICD-10-CM | POA: Insufficient documentation

## 2022-09-02 DIAGNOSIS — K6289 Other specified diseases of anus and rectum: Secondary | ICD-10-CM | POA: Diagnosis not present

## 2022-09-02 DIAGNOSIS — F32A Depression, unspecified: Secondary | ICD-10-CM | POA: Insufficient documentation

## 2022-09-02 DIAGNOSIS — Z046 Encounter for general psychiatric examination, requested by authority: Secondary | ICD-10-CM | POA: Diagnosis present

## 2022-09-02 DIAGNOSIS — F1414 Cocaine abuse with cocaine-induced mood disorder: Secondary | ICD-10-CM | POA: Diagnosis not present

## 2022-09-02 LAB — COMPREHENSIVE METABOLIC PANEL
ALT: 15 U/L (ref 0–44)
AST: 23 U/L (ref 15–41)
Albumin: 3.5 g/dL (ref 3.5–5.0)
Alkaline Phosphatase: 55 U/L (ref 38–126)
Anion gap: 8 (ref 5–15)
BUN: 14 mg/dL (ref 6–20)
CO2: 25 mmol/L (ref 22–32)
Calcium: 9.3 mg/dL (ref 8.9–10.3)
Chloride: 107 mmol/L (ref 98–111)
Creatinine, Ser: 0.92 mg/dL (ref 0.61–1.24)
GFR, Estimated: >60 mL/min (ref >60)
Glucose, Bld: 112 mg/dL — ABNORMAL HIGH (ref 70–99)
Potassium: 3.7 mmol/L (ref 3.5–5.1)
Sodium: 140 mmol/L (ref 135–145)
Total Bilirubin: 0.6 mg/dL (ref 0.3–1.2)
Total Protein: 7.1 g/dL (ref 6.5–8.1)

## 2022-09-02 LAB — RESP PANEL BY RT-PCR (FLU A&B, COVID) ARPGX2
Influenza A by PCR: NEGATIVE
Influenza B by PCR: NEGATIVE
SARS Coronavirus 2 by RT PCR: NEGATIVE

## 2022-09-02 LAB — CBC
HCT: 36.1 % — ABNORMAL LOW (ref 39.0–52.0)
Hemoglobin: 12.2 g/dL — ABNORMAL LOW (ref 13.0–17.0)
MCH: 28.4 pg (ref 26.0–34.0)
MCHC: 33.8 g/dL (ref 30.0–36.0)
MCV: 84.1 fL (ref 80.0–100.0)
Platelets: 351 10*3/uL (ref 150–400)
RBC: 4.29 MIL/uL (ref 4.22–5.81)
RDW: 13.7 % (ref 11.5–15.5)
WBC: 7.4 10*3/uL (ref 4.0–10.5)
nRBC: 0 % (ref 0.0–0.2)

## 2022-09-02 LAB — RAPID URINE DRUG SCREEN, HOSP PERFORMED
Amphetamines: NOT DETECTED
Barbiturates: NOT DETECTED
Benzodiazepines: POSITIVE — AB
Cocaine: POSITIVE — AB
Opiates: NOT DETECTED
Tetrahydrocannabinol: POSITIVE — AB

## 2022-09-02 LAB — SALICYLATE LEVEL: Salicylate Lvl: <7 mg/dL — ABNORMAL LOW (ref 7.0–30.0)

## 2022-09-02 LAB — ETHANOL: Alcohol, Ethyl (B): <10 mg/dL (ref <10)

## 2022-09-02 LAB — ACETAMINOPHEN LEVEL: Acetaminophen (Tylenol), Serum: <10 ug/mL — ABNORMAL LOW (ref 10–30)

## 2022-09-02 MED ORDER — NIFEDIPINE 0.3 % OINTMENT: 1.0000 | TOPICAL_OINTMENT | Freq: Four times a day (QID) | CUTANEOUS | 0 refills | Status: DC

## 2022-09-02 MED ORDER — TRAZODONE HCL 100 MG PO TABS: 50.0000 mg | ORAL_TABLET | Freq: Every evening | ORAL | Status: DC | PRN

## 2022-09-02 MED ORDER — ONDANSETRON HCL 4 MG PO TABS: 4.0000 mg | ORAL_TABLET | Freq: Three times a day (TID) | ORAL | Status: DC | PRN

## 2022-09-02 MED ORDER — ARIPIPRAZOLE 5 MG PO TABS
5.0000 mg | ORAL_TABLET | Freq: Every day | ORAL | Status: DC
  Administered 2022-09-03: 5 mg via ORAL
  Filled 2022-09-02: qty 1

## 2022-09-02 MED ORDER — LIDOCAINE HCL URETHRAL/MUCOSAL 2 % EX GEL: 1.0000 | Freq: Four times a day (QID) | CUTANEOUS | Status: DC | PRN

## 2022-09-02 MED ORDER — ACETAMINOPHEN 325 MG PO TABS: 650.0000 mg | ORAL_TABLET | ORAL | Status: DC | PRN

## 2022-09-02 MED ORDER — HYDROXYZINE HCL 25 MG PO TABS: 25.0000 mg | ORAL_TABLET | Freq: Three times a day (TID) | ORAL | Status: DC | PRN

## 2022-09-02 NOTE — ED Notes (Signed)
Pt ambulated to restroom. 

## 2022-09-02 NOTE — ED Notes (Signed)
Pt dressed out into purple scrubs and wand by security. Belongings are in a Pt belongings bag in cabinet. Pt has $10 in pants pocket.

## 2022-09-02 NOTE — ED Notes (Signed)
Pt resting comfortably. Respirations even and unlabored.  

## 2022-09-02 NOTE — ED Triage Notes (Addendum)
Patient states he is suicidal with no plan, but states, "There's a reason I am suicidal."  Patient denies HI, visual or auditory hallucinations, alcohol, or drugs.  Patient c/o rectal pain and states he has been "having anal sex and needs to have it checked out."

## 2022-09-02 NOTE — ED Provider Notes (Signed)
[facility] DEPT Provider Note   CSN: [id number] Arrival date & time: 09/02/22  1435     History PMH: Schizophrenia, Polysubstance use, Suicidal Ideations Chief Complaint  Patient presents with   Suicidal   Rectal Pain    Tyler White is a 26 y.o. male. Patient presents to the ED with chief complaint of suicidal ideations that started yesterday.  He says that he does not really want to talk about what happened, but involved sexual activity involving anal penetration that occurred yesterday.  He says ever since the sexual encounter, he has had significant rectal pain as well as associated bright red rectal bleeding.  He feels that he may have had a tear.  He says ever since then, he has felt increased depression and suicidal ideations.  Does not have a specific plan, but does feel that he could act on this without intervention.  He has been suicidal in the past.  He denies any homicidal ideations, hallucinations, alcohol or drug on board.  HPI     Home Medications Prior to Admission medications   Medication Sig Start Date End Date Taking? Authorizing Provider  nifedipine 0.3 % ointment Place 1 Application rectally 4 (four) times daily. 09/02/22  Yes Demitri Kucinski, Adora Fridge, PA-C  paliperidone (INVEGA SUSTENNA) 234 MG/1.5ML SUSY injection Inject 234 mg into the muscle once for 1 dose. Patient taking differently: Inject 234 mg into the muscle every 30 (thirty) days. 04/16/22 09/02/22 Yes Tharon Aquas, NP  paliperidone (INVEGA SUSTENNA) 156 MG/ML SUSY injection Inject 156 mg into the muscle every 30 (thirty) days. Patient not taking: Reported on 09/02/2022    [provider]  gabapentin (NEURONTIN) 400 MG capsule Take 1 capsule (400 mg total) by mouth 3 (three) times daily. Patient not taking: Reported on 06/09/2021 04/30/21 06/10/21  Ethelene Hal, NP      Allergies    Patient has no known allergies.    Review of Systems   Review of Systems   Psychiatric/Behavioral:  Positive for suicidal ideas. Negative for confusion, hallucinations and sleep disturbance. The patient is not nervous/anxious and is not hyperactive.   All other systems reviewed and are negative.   Physical Exam Updated Vital Signs BP 128/88 (BP Location: Right Arm)   Pulse 64   Temp 98.4 F (36.9 C) (Oral)   Resp 16   Ht '5\' 8"'$  (1.727 m)   Wt 68 kg   SpO2 100%   BMI 22.81 kg/m  Physical Exam Vitals and nursing note reviewed. Exam conducted with a chaperone present.  Constitutional:      General: He is not in acute distress.    Appearance: Normal appearance. He is well-developed. He is not ill-appearing, toxic-appearing or diaphoretic.  HENT:     Head: Normocephalic and atraumatic.     Nose: No nasal deformity.     Mouth/Throat:     Lips: Pink. No lesions.  Eyes:     General: Gaze aligned appropriately. No scleral icterus.       Right eye: No discharge.        Left eye: No discharge.     Conjunctiva/sclera: Conjunctivae normal.     Right eye: Right conjunctiva is not injected. No exudate or hemorrhage.    Left eye: Left conjunctiva is not injected. No exudate or hemorrhage. Pulmonary:     Effort: Pulmonary effort is normal. No respiratory distress.  Genitourinary:    Comments: No external anal fissure identified. Non palpated on internal rectal exam. Normal  tone. He does have proctitis pain during examinations most notably at the anterior internal rectum.  Chaperone is present during exam. Skin:    General: Skin is warm and dry.  Neurological:     Mental Status: He is alert and oriented to person, place, and time.  Psychiatric:        Mood and Affect: Mood normal.        Speech: Speech normal.        Behavior: Behavior normal. Behavior is cooperative.        Thought Content: Thought content is not paranoid or delusional. Thought content includes suicidal ideation. Thought content does not include homicidal ideation. Thought content does not  include homicidal or suicidal plan.     ED Results / Procedures / Treatments   Labs (all labs ordered are listed, but only abnormal results are displayed) Labs Reviewed  COMPREHENSIVE METABOLIC PANEL - Abnormal; Notable for the following components:      Result Value   Glucose, Bld 112 (*)    All other components within normal limits  SALICYLATE LEVEL - Abnormal; Notable for the following components:   Salicylate Lvl <9.3 (*)    All other components within normal limits  ACETAMINOPHEN LEVEL - Abnormal; Notable for the following components:   Acetaminophen (Tylenol), Serum <10 (*)    All other components within normal limits  CBC - Abnormal; Notable for the following components:   Hemoglobin 12.2 (*)    HCT 36.1 (*)    All other components within normal limits  RAPID URINE DRUG SCREEN, HOSP PERFORMED - Abnormal; Notable for the following components:   Cocaine POSITIVE (*)    Benzodiazepines POSITIVE (*)    Tetrahydrocannabinol POSITIVE (*)    All other components within normal limits  RESP PANEL BY RT-PCR (FLU A&B, COVID) [id number]  ETHANOL    EKG None  Radiology No results found.  Procedures Procedures   Medications Ordered in ED Medications  lidocaine (XYLOCAINE) 2 % jelly 1 Application (has no administration in time range)  acetaminophen (TYLENOL) tablet 650 mg (has no administration in time range)  ondansetron (ZOFRAN) tablet 4 mg (has no administration in time range)    ED Course/ Medical Decision Making/ A&P                           Medical Decision Making Amount and/or Complexity of Data Reviewed Labs: ordered.  Risk OTC drugs. Prescription drug management.    MDM  This is a 26 y.o. male who presents to the ED with SI and Rectal pain  Initial Impression  Rectal pain in the setting of recent anal intercourse yesterday afternoon. Based on history and exam, this is likely 2/2 to a small tear. I have offered lidocaine jelly for pain control and will  send him home with Nefidipine ointment upon discharge. Recommend other supportive measures.  He is also suicidal today. No plan, but he does feel that he will act on this. Seems exacerbated from baseline due to sexual encounter that occurred yesterday afternoon, but patient refuses to elaborate more on this. He has been seen here recently for similar psychiatric concerns. Medical Clearance labs ordered.  I personally ordered, reviewed, and interpreted all laboratory work and imaging and agree with radiologist interpretation. Results interpreted below: CBC and CMP reveal stable findings. Tox screen notable for + cocaine, Benzo, and THC. ETOH, ASA, and APAP undetectable.  Assessment/Plan:  See plan above for proctalgia.  He is  medically cleared. Will consult to TTS for SI.    Charting Requirements Additional history is obtained from:  Independent historian External Records from outside source obtained and reviewed including: n/a Social Determinants of Health:  Alcoholism/Drug Addiction Pertinant PMH that complicates patient's illness: Polysubstance use disorder, schizophrenia  Patient Care Problems that were addressed during this visit: - SI: Acute illness with complication - Proctalgia: Acute illness with complication Medications given in ED: Lidocaine Jelly Reevaluation of the patient after these medicines showed that the patient stayed the same I have reviewed home medications and made changes accordingly.  Critical Care Interventions: n/a Consultations: TTS Disposition: TTS consult  Portions of this note were generated with Dragon dictation software. Dictation errors may occur despite best attempts at proofreading.  Final Clinical Impression(s) / ED Diagnoses Final diagnoses:  Suicidal ideation  Proctalgia    Rx / DC Orders ED Discharge Orders          Ordered    nifedipine 0.3 % ointment  4 times daily        09/02/22 1611              Aviv Lengacher, Adora Fridge,  PA-C 09/02/22 1733    Malvin Johns, MD 09/02/22 2319

## 2022-09-02 NOTE — Consult Note (Signed)
[facility] ED ASSESSMENT   Reason for Consult:  Psychiatry evaluation Referring Physician:  ER Physician Patient Identification: Tyler White MRN:  [id number] ED Chief Complaint: Cocaine abuse with cocaine-induced mood disorder ([place])  Diagnosis:  Principal Problem:   Cocaine abuse with cocaine-induced mood disorder ([place]) Active Problems:   Depression   ED Assessment Time Calculation: Start Time: 1826 Stop Time: 1850 Total Time in Minutes (Assessment Completion): 24   Subjective:   Tyler White is a 26 y.o. male patient admitted with previous hx of schizoaffective disorder, bipolar type, cocaine abuse, noncompliance with medications came to the ER this evening with compliant of feeling sad, depressed and suicidal in the contest of .rectal bleeding after anal sexual encounter.  Patient is homeless, appears disheveled and was speaking in low tone and made minimal eye contact.  He also admitted that he used Cocaine last night before the sexual encounter.,  HPI:  Patient reported that he feels depressed daily and uses Cocaine to calm his depression.  He is feeling suicidal with no plan.  He reported his triggers includes not feeling like " real man" because he cannot be a father to his 34 years old daughter.  He is estranged from every member of his family and he is unemployed.  Patient has multiple ER visit and  Interior visits as well.  Patient reported that he was taking Risperdal initially but is now on LAI Invega and last dose was 08/14/2022 when he received his first dose of Invega Sustenna 234 mg. Patient admitted he did not come back for his second dose on August 23rd.He was hospitalized at [facility] January this year.  Patient is not happy that he has no job and is homeless but declined any relationship with his family.  Patient reported poor sleep, good appetite but no food to eat.  Patient admitted feeling worthless, hopeless and again states he is not a complete man because he cannot be in the life  of his 30 years old daughter due to substance abuse and inability to provide for him.  Patient denies HI/AVH and no mention of paranoia.  We will monitor overnight and re-evaluate in am for appropriate disposition. Past Psychiatric History: Schizoaffective disorder, bipolar-type.  Polysubstance abuse, Admission at [facility] January this year.  Multiple ER/UC visits for various reasons including accidental Fentanyl OD   Risk to Self or Others: Is the patient at risk to self? Yes Has the patient been a risk to self in the past 6 months? Yes Has the patient been a risk to self within the distant past? Yes Is the patient a risk to others? No Has the patient been a risk to others in the past 6 months? No Has the patient been a risk to others within the distant past? No  [place] Scale:  [facility] ED from 09/02/2022 in [facility] DEPT Most recent reading at 09/02/2022  2:56 PM ED from 08/22/2022 in [facility] Most recent reading at 08/22/2022  7:41 PM ED from 08/22/2022 in [facility] Most recent reading at 08/22/2022 11:37 AM  C-SSRS RISK CATEGORY High Risk High Risk No Risk       AIMS:  , , ,  ,   ASAM:    Substance Abuse:     Past Medical History:  Past Medical History:  Diagnosis Date   Hernia, inguinal, right    Inguinal hernia    right   Psychiatric illness    Schizophrenia (  [place])    History reviewed. No pertinent surgical history. Family History:  Family History  Problem Relation Age of Onset   Psychiatric Illness Mother    Hypertension Sister    Family Psychiatric  History: Mother Unknown Mental illness Social History:  Social History   Substance and Sexual Activity  Alcohol Use Not Currently     Social History   Substance and Sexual Activity  Drug Use Yes   Types: Marijuana, Cocaine    Social History   Socioeconomic History   Marital status: Single    Spouse  name: Not on file   Number of children: Not on file   Years of education: 10   Highest education level: Not on file  Occupational History   Not on file  Tobacco Use   Smoking status: Every Day    Packs/day: 0.50    Years: 5.00    Total pack years: 2.50    Types: Cigarettes   Smokeless tobacco: Never  Vaping Use   Vaping Use: Never used  Substance and Sexual Activity   Alcohol use: Not Currently   Drug use: Yes    Types: Marijuana, Cocaine   Sexual activity: Yes    Birth control/protection: None  Other Topics Concern   Not on file  Social History Narrative   ** Merged History Encounter **       ** Merged History Encounter **       Social Determinants of Health   Financial Resource Strain: Not on file  Food Insecurity: Not on file  Transportation Needs: Not on file  Physical Activity: Not on file  Stress: Not on file  Social Connections: Not on file   Additional Social History:    Allergies:  No Known Allergies  Labs:  Results for orders placed or performed during the hospital encounter of 09/02/22 (from the past 48 hour(s))  Comprehensive metabolic panel     Status: Abnormal   Collection Time: 09/02/22  2:55 PM  Result Value Ref Range   Sodium 140 135 - 145 mmol/L   Potassium 3.7 3.5 - 5.1 mmol/L   Chloride 107 98 - 111 mmol/L   CO2 25 22 - 32 mmol/L   Glucose, Bld 112 (H) 70 - 99 mg/dL    Comment: Glucose reference range applies only to samples taken after fasting for at least 8 hours.   BUN 14 6 - 20 mg/dL   Creatinine, Ser 0.92 0.61 - 1.24 mg/dL   Calcium 9.3 8.9 - 10.3 mg/dL   Total Protein 7.1 6.5 - 8.1 g/dL   Albumin 3.5 3.5 - 5.0 g/dL   AST 23 15 - 41 U/L   ALT 15 0 - 44 U/L   Alkaline Phosphatase 55 38 - 126 U/L   Total Bilirubin 0.6 0.3 - 1.2 mg/dL   GFR, Estimated >60 >60 mL/min    Comment: (NOTE) Calculated using the CKD-EPI Creatinine Equation (2021)    Anion gap 8 5 - 15    Comment: Performed at [facility], [place]  [address] [place], [place] [address]  cbc     Status: Abnormal   Collection Time: 09/02/22  2:55 PM  Result Value Ref Range   WBC 7.4 4.0 - 10.5 K/uL   RBC 4.29 4.22 - 5.81 MIL/uL   Hemoglobin 12.2 (L) 13.0 - 17.0 g/dL   HCT 36.1 (L) 39.0 - 52.0 %   MCV 84.1 80.0 - 100.0 fL   MCH 28.4 26.0 - 34.0 pg  MCHC 33.8 30.0 - 36.0 g/dL   RDW 13.7 11.5 - 15.5 %   Platelets 351 150 - 400 K/uL   nRBC 0.0 0.0 - 0.2 %    Comment: Performed at [facility], [place] [address], [place], [place] [address]  Rapid urine drug screen (hospital performed)     Status: Abnormal   Collection Time: 09/02/22  2:55 PM  Result Value Ref Range   Opiates NONE DETECTED NONE DETECTED   Cocaine POSITIVE (A) NONE DETECTED   Benzodiazepines POSITIVE (A) NONE DETECTED   Amphetamines NONE DETECTED NONE DETECTED   Tetrahydrocannabinol POSITIVE (A) NONE DETECTED   Barbiturates NONE DETECTED NONE DETECTED    Comment: (NOTE) DRUG SCREEN FOR MEDICAL PURPOSES ONLY.  IF CONFIRMATION IS NEEDED FOR ANY PURPOSE, NOTIFY LAB WITHIN 5 DAYS.  LOWEST DETECTABLE LIMITS FOR URINE DRUG SCREEN Drug Class                     Cutoff (ng/mL) Amphetamine and metabolites    1000 Barbiturate and metabolites    200 Benzodiazepine                 469 Tricyclics and metabolites     300 Opiates and metabolites        300 Cocaine and metabolites        300 THC                            50 Performed at [facility], [place] [address] [place], [place] [address]   Ethanol     Status: None   Collection Time: 09/02/22  3:10 PM  Result Value Ref Range   Alcohol, Ethyl (B) <10 <10 mg/dL    Comment: (NOTE) Lowest detectable limit for serum alcohol is 10 mg/dL.  For medical purposes only. Performed at [facility], [place] [address], [place], [place] [address]   Salicylate level     Status: Abnormal   Collection Time: 09/02/22  3:10 PM  Result Value Ref Range   Salicylate Lvl <4.4 (L)  7.0 - 30.0 mg/dL    Comment: Performed at [facility], [place] [address] [place], [place] [address]  Acetaminophen level     Status: Abnormal   Collection Time: 09/02/22  3:10 PM  Result Value Ref Range   Acetaminophen (Tylenol), Serum <10 (L) 10 - 30 ug/mL    Comment: (NOTE) Therapeutic concentrations vary significantly. A range of 10-30 ug/mL  may be an effective concentration for many patients. However, some  are best treated at concentrations outside of this range. Acetaminophen concentrations >150 ug/mL at 4 hours after ingestion  and >50 ug/mL at 12 hours after ingestion are often associated with  toxic reactions.  Performed at [facility], [place] [address], [place], [place] [address]   Resp Panel by RT-PCR (Flu A&B, Covid) Anterior Nasal Swab     Status: None   Collection Time: 09/02/22  5:30 PM   Specimen: Anterior Nasal Swab  Result Value Ref Range   SARS Coronavirus 2 by RT PCR NEGATIVE NEGATIVE    Comment: (NOTE) SARS-CoV-2 target nucleic acids are NOT DETECTED.  The SARS-CoV-2 RNA is generally detectable in upper respiratory specimens during the acute phase of infection. The lowest concentration of SARS-CoV-2 viral copies this assay can detect is 138 copies/mL. A negative result does not preclude SARS-Cov-2 infection and should not be used as the  sole basis for treatment or other patient management decisions. A negative result may occur with  improper specimen collection/handling, submission of specimen other than nasopharyngeal swab, presence of viral mutation(s) within the areas targeted by this assay, and inadequate number of viral copies(<138 copies/mL). A negative result must be combined with clinical observations, patient history, and epidemiological information. The expected result is Negative.  Fact Sheet for Patients:  EntrepreneurPulse.com.au  Fact Sheet for Healthcare Providers:   IncredibleEmployment.be  This test is no t yet approved or cleared by the [place] FDA and  has been authorized for detection and/or diagnosis of SARS-CoV-2 by FDA under an Emergency Use Authorization (EUA). This EUA will remain  in effect (meaning this test can be used) for the duration of the COVID-19 declaration under Section 564(b)(1) of the Act, 21 U.S.C.section 360bbb-3(b)(1), unless the authorization is terminated  or revoked sooner.       Influenza A by PCR NEGATIVE NEGATIVE   Influenza B by PCR NEGATIVE NEGATIVE    Comment: (NOTE) The Xpert Xpress SARS-CoV-2/FLU/RSV plus assay is intended as an aid in the diagnosis of influenza from Nasopharyngeal swab specimens and should not be used as a sole basis for treatment. Nasal washings and aspirates are unacceptable for Xpert Xpress SARS-CoV-2/FLU/RSV testing.  Fact Sheet for Patients: EntrepreneurPulse.com.au  Fact Sheet for Healthcare Providers: IncredibleEmployment.be  This test is not yet approved or cleared by the [place] FDA and has been authorized for detection and/or diagnosis of SARS-CoV-2 by FDA under an Emergency Use Authorization (EUA). This EUA will remain in effect (meaning this test can be used) for the duration of the COVID-19 declaration under Section 564(b)(1) of the Act, 21 U.S.C. section 360bbb-3(b)(1), unless the authorization is terminated or revoked.  Performed at [facility], [place] [address], [place], [place] [address]     Current Facility-Administered Medications  Medication Dose Route Frequency Provider Last Rate Last Admin   acetaminophen (TYLENOL) tablet 650 mg  650 mg Oral Q4H PRN Loeffler, Adora Fridge, PA-C       [START ON 09/03/2022] ARIPiprazole (ABILIFY) tablet 5 mg  5 mg Oral Daily Lilymae Swiech C, NP       hydrOXYzine (ATARAX) tablet 25 mg  25 mg Oral TID PRN Charmaine Downs C, NP       lidocaine  (XYLOCAINE) 2 % jelly 1 Application  1 Application Other P8E PRN Loeffler, Grace C, PA-C       ondansetron (ZOFRAN) tablet 4 mg  4 mg Oral Q8H PRN Loeffler, Grace C, PA-C       traZODone (DESYREL) tablet 50 mg  50 mg Oral QHS PRN Delfin Gant, NP       Current Outpatient Medications  Medication Sig Dispense Refill   nifedipine 0.3 % ointment Place 1 Application rectally 4 (four) times daily. 30 g 0   paliperidone (INVEGA SUSTENNA) 234 MG/1.5ML SUSY injection Inject 234 mg into the muscle once for 1 dose. (Patient taking differently: Inject 234 mg into the muscle every 30 (thirty) days.) 1.5 mL 0   paliperidone (INVEGA SUSTENNA) 156 MG/ML SUSY injection Inject 156 mg into the muscle every 30 (thirty) days. (Patient not taking: Reported on 09/02/2022)      Musculoskeletal: Strength & Muscle Tone: within normal limits Gait & Station: normal Patient leans: Front   Psychiatric Specialty Exam: Presentation  General Appearance: Disheveled  Eye Contact:Minimal  Speech:Clear and Coherent; Normal Rate  Speech Volume:Decreased  Handedness:Right   Mood and Affect  Mood:Depressed  Affect:Congruent   Thought Process  Thought Processes:Coherent; Goal Directed  Descriptions of Associations:Intact  Orientation:Full (Time, Place and Person)  Thought Content:Logical  History of Schizophrenia/Schizoaffective disorder:Yes  Duration of Psychotic Symptoms:Greater than six months  Hallucinations:Hallucinations: None  Ideas of Reference:None  Suicidal Thoughts:Suicidal Thoughts: Yes, Active SI Active Intent and/or Plan: Without Plan  Homicidal Thoughts:Homicidal Thoughts: No   Sensorium  Memory:Immediate Good; Recent Good; Remote Fair  Judgment:Fair  Insight:Good   Executive Functions  Concentration:Good  Attention Span:Good  [place] of Knowledge:Good  Language:Good   Psychomotor Activity  Psychomotor Activity:Psychomotor Activity:  Normal   Assets  Assets:Communication Skills    Sleep  Sleep:Sleep: Poor   Physical Exam: Physical Exam Vitals and nursing note reviewed.  Constitutional:      Appearance: Normal appearance.  HENT:     Head: Normocephalic.     Nose: Nose normal.  Cardiovascular:     Rate and Rhythm: Normal rate.  Pulmonary:     Effort: Pulmonary effort is normal.  Musculoskeletal:        General: Normal range of motion.     Cervical back: Normal range of motion.  Skin:    General: Skin is warm and dry.  Neurological:     Mental Status: He is alert and oriented to person, place, and time.    Review of Systems  Constitutional: Negative.   HENT: Negative.    Eyes: Negative.   Respiratory: Negative.    Cardiovascular: Negative.   Gastrointestinal: Negative.   Genitourinary: Negative.   Musculoskeletal: Negative.   Skin: Negative.   Neurological: Negative.   Endo/Heme/Allergies: Negative.   Psychiatric/Behavioral:  Positive for depression, substance abuse and suicidal ideas.    Blood pressure 128/88, pulse 64, temperature 98.4 F (36.9 C), temperature source Oral, resp. rate 16, height '5\' 8"'$  (1.727 m), weight 68 kg, SpO2 100 %. Body mass index is 22.81 kg/m.  Medical Decision Making: Patient feels hopeless, worthless and depressed at this time.  He is suicidal with no plan he says.  He reported the anal sexual intercourse was not consensual but does not want to make  a report.  He feels sad about it he says.  He will be re-evaluated in am to determine appropriate disposition. We will offer Abilify 5 mg and Trazodone for sleep. 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  Problem 3: Schizoaffective disorder, Bipolar type.  Disposition:  Monitor Overnight and re-evaluate in am.  Delfin Gant, NP-PMHNP-BC 09/02/2022 7:06 PM

## 2022-09-02 NOTE — Discharge Instructions (Addendum)
Rectal Pain Home care - Use Nifedipine ointment that has been prescribed for you as needed for rectal pain - Warm sitz baths 15 min TID-QID and after each bowel movement  - High-fiber diet  - Meticulous anal hygiene is imperative; after defecation anus must be cleaned thoroughly  Follow-up as instructed by behavioral health

## 2022-09-03 DIAGNOSIS — F1414 Cocaine abuse with cocaine-induced mood disorder: Secondary | ICD-10-CM

## 2022-09-03 NOTE — ED Provider Notes (Signed)
Emergency Medicine Observation Re-evaluation Note  Tyler White is a 26 y.o. male, seen on rounds today.  Pt initially presented to the ED for complaints of Suicidal and Rectal Pain Currently, the patient is psychosis and substance abuse  Physical Exam  BP 132/76 (BP Location: Right Arm)   Pulse 63   Temp 98.7 F (37.1 C) (Oral)   Resp 16   Ht '5\' 8"'$  (1.727 m)   Wt 68 kg   SpO2 98%   BMI 22.81 kg/m  Physical Exam Alert in no acute distress  ED Course / MDM  EKG:   I have reviewed the labs performed to date as well as medications administered while in observation.  Recent changes in the last 24 hours include less aggressive.  Plan  Current plan is for psych to reevaluate in the morning and possibly discharge.    Milton Ferguson, MD 09/03/22 [id number]
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
## 2022-09-09 ENCOUNTER — Encounter (HOSPITAL_COMMUNITY): Payer: Self-pay

## 2022-09-09 ENCOUNTER — Encounter (HOSPITAL_COMMUNITY): Payer: Self-pay | Admitting: Emergency Medicine

## 2022-09-09 ENCOUNTER — Other Ambulatory Visit: Payer: Self-pay

## 2022-09-09 ENCOUNTER — Emergency Department (HOSPITAL_COMMUNITY)
Admission: EM | Admit: 2022-09-09 | Discharge: 2022-09-09 | Disposition: A | Discharge status: Home / Self Care | Payer: Medicaid Other | Attending: Emergency Medicine | Admitting: Emergency Medicine

## 2022-09-09 ENCOUNTER — Emergency Department (HOSPITAL_COMMUNITY)
Admission: EM | Admit: 2022-09-09 | Discharge: 2022-09-09 | Discharge status: Against Medical Advice | Payer: Medicaid Other | Attending: Emergency Medicine | Admitting: Emergency Medicine

## 2022-09-09 DIAGNOSIS — Z5321 Procedure and treatment not carried out due to patient leaving prior to being seen by health care provider: Secondary | ICD-10-CM | POA: Diagnosis not present

## 2022-09-09 DIAGNOSIS — K625 Hemorrhage of anus and rectum: Secondary | ICD-10-CM | POA: Insufficient documentation

## 2022-09-09 DIAGNOSIS — K648 Other hemorrhoids: Secondary | ICD-10-CM | POA: Diagnosis not present

## 2022-09-09 DIAGNOSIS — K644 Residual hemorrhoidal skin tags: Secondary | ICD-10-CM | POA: Diagnosis not present

## 2022-09-09 DIAGNOSIS — Z5900 Homelessness unspecified: Secondary | ICD-10-CM | POA: Diagnosis not present

## 2022-09-09 DIAGNOSIS — K6289 Other specified diseases of anus and rectum: Secondary | ICD-10-CM | POA: Diagnosis present

## 2022-09-09 LAB — COMPREHENSIVE METABOLIC PANEL
ALT: 21 U/L (ref 0–44)
AST: 38 U/L (ref 15–41)
Albumin: 3.4 g/dL — ABNORMAL LOW (ref 3.5–5.0)
Alkaline Phosphatase: 51 U/L (ref 38–126)
Anion gap: 9 (ref 5–15)
BUN: 13 mg/dL (ref 6–20)
CO2: 22 mmol/L (ref 22–32)
Calcium: 9.3 mg/dL (ref 8.9–10.3)
Chloride: 104 mmol/L (ref 98–111)
Creatinine, Ser: 1.01 mg/dL (ref 0.61–1.24)
GFR, Estimated: >60 mL/min (ref >60)
Glucose, Bld: 130 mg/dL — ABNORMAL HIGH (ref 70–99)
Potassium: 3.2 mmol/L — ABNORMAL LOW (ref 3.5–5.1)
Sodium: 135 mmol/L (ref 135–145)
Total Bilirubin: 0.7 mg/dL (ref 0.3–1.2)
Total Protein: 6.9 g/dL (ref 6.5–8.1)

## 2022-09-09 LAB — CBC WITH DIFFERENTIAL/PLATELET
Abs Immature Granulocytes: 0.03 10*3/uL (ref 0.00–0.07)
Basophils Absolute: 0.1 10*3/uL (ref 0.0–0.1)
Basophils Relative: 1 %
Eosinophils Absolute: 0.2 10*3/uL (ref 0.0–0.5)
Eosinophils Relative: 2 %
HCT: 34.9 % — ABNORMAL LOW (ref 39.0–52.0)
Hemoglobin: 12.1 g/dL — ABNORMAL LOW (ref 13.0–17.0)
Immature Granulocytes: 0 %
Lymphocytes Relative: 22 %
Lymphs Abs: 2 10*3/uL (ref 0.7–4.0)
MCH: 28.3 pg (ref 26.0–34.0)
MCHC: 34.7 g/dL (ref 30.0–36.0)
MCV: 81.7 fL (ref 80.0–100.0)
Monocytes Absolute: 0.7 10*3/uL (ref 0.1–1.0)
Monocytes Relative: 8 %
Neutro Abs: 6.1 10*3/uL (ref 1.7–7.7)
Neutrophils Relative %: 67 %
Platelets: 347 10*3/uL (ref 150–400)
RBC: 4.27 MIL/uL (ref 4.22–5.81)
RDW: 13.2 % (ref 11.5–15.5)
WBC: 9.1 10*3/uL (ref 4.0–10.5)
nRBC: 0 % (ref 0.0–0.2)

## 2022-09-09 LAB — URINALYSIS, ROUTINE W REFLEX MICROSCOPIC
Bilirubin Urine: NEGATIVE
Glucose, UA: NEGATIVE mg/dL
Hgb urine dipstick: NEGATIVE
Ketones, ur: NEGATIVE mg/dL
Nitrite: NEGATIVE
Protein, ur: 30 mg/dL — AB
Specific Gravity, Urine: 1.025 (ref 1.005–1.030)
pH: 5 (ref 5.0–8.0)

## 2022-09-09 LAB — HIV ANTIBODY (ROUTINE TESTING W REFLEX): HIV Screen 4th Generation wRfx: NONREACTIVE

## 2022-09-09 LAB — TYPE AND SCREEN
ABO/RH(D): O POS
Antibody Screen: NEGATIVE

## 2022-09-09 NOTE — ED Triage Notes (Signed)
Back for rectal pain but left before being seen.

## 2022-09-09 NOTE — ED Triage Notes (Signed)
Pt reports rectal pain/bleeding that started 30 minutes to arrival.  Pt is very vague in his description of symptoms.

## 2022-09-09 NOTE — ED Provider Triage Note (Signed)
Emergency Medicine Provider Triage Evaluation Note  Tyler White , a 26 y.o. male  was evaluated in triage.  Pt complains of rectal pain/bleeding.  Started 30 mins PTA.  Not currently on anticoagulation.  Vague in his description.  Patient homeless, well known to facility with 41 visits in the past 6 months for various complaints. Review of Systems  Positive: Rectal pain/bleeding Negative: fever  Physical Exam  BP 138/79   Pulse 92   Temp 98.6 F (37 C) (Oral)   Resp 16   SpO2 98%  Gen:   Awake, no distress   Resp:  Normal effort  MSK:   Moves extremities without difficulty  Other:  Rectal deferred in triage  Medical Decision Making  Medically screening exam initiated at 4:21 AM.  Appropriate orders placed.  Tyler White was informed that the remainder of the evaluation will be completed by another provider, this initial triage assessment does not replace that evaluation, and the importance of remaining in the ED until their evaluation is complete.  Rectal pain/bleeding.  VSS.  Labs ordered.   Larene Pickett, PA-C 09/09/22 0423

## 2022-09-09 NOTE — ED Provider Triage Note (Signed)
Emergency Medicine Provider Triage Evaluation Note  Tyler White , a 26 y.o. male  was evaluated in triage.  Pt complains of rectal pain. Already been evaluated for this before and received treatment  Review of Systems  Positive:  Negative:   Physical Exam  Ht '5\' 8"'$  (1.727 m)   Wt 67.6 kg   BMI 22.66 kg/m  Gen:   Awake, no distress   Resp:  Normal effort  MSK:   Moves extremities without difficulty  Other:    Medical Decision Making  Medically screening exam initiated at 11:46 AM.  Appropriate orders placed.  Tyler White was informed that the remainder of the evaluation will be completed by another provider, this initial triage assessment does not replace that evaluation, and the importance of remaining in the ED until their evaluation is complete.     Adolphus Birchwood, PA-C 09/09/22 1146

## 2022-09-09 NOTE — Discharge Instructions (Signed)
You have been seen today for your complaint of rectal pain and bleeding. Your lab work was reassuring. Home care instructions are as follows:  You should take benefiber or metamucil to soften stools. Avoid straining on the toilet. You may try sitz baths, you can buy this over the counter. Follow up with:your primary care in 1 week Please seek immediate medical care if you develop any of the following symptoms: Bleeding that will not stop. At this time there does not appear to be the presence of an emergent medical condition, however there is always the potential for conditions to change. Please read and follow the below instructions.  Do not take your medicine if  develop an itchy rash, swelling in your mouth or lips, or difficulty breathing; call 911 and seek immediate emergency medical attention if this occurs.  You may review your lab tests and imaging results in their entirety on your MyChart account.  Please discuss all results of fully with your primary care provider and other specialist at your follow-up visit.  Note: Portions of this text may have been transcribed using voice recognition software. Every effort was made to ensure accuracy; however, inadvertent computerized transcription errors may still be present.

## 2022-09-09 NOTE — ED Provider Notes (Signed)
[facility] EMERGENCY DEPARTMENT Provider Note   CSN: [id number] Arrival date & time: 09/09/22  1127     History  Chief Complaint  Patient presents with   Rectal Pain    Tyler White is a 26 y.o. male.  With history consistent with multiple psychiatric illnesses and homelessness who presents ED for evaluation of rectal pain.  He reports pain started yesterday evening.  Describes pain as intense and itchy.  He states he had a bowel movement last night and noticed bright red blood on the toilet paper.  No melena or hematochezia.  Patient also reports dyschezia.  Has a history of hemorrhoids, however states that they have never bled before.  Denies constipation, diarrhea, abdominal pain, dysuria, frequency, urgency, penile discharge, fevers, chills, nausea, vomiting, diarrhea.  Patient does state that he participates in receptive anal intercourse, however has not had sexual contact recently.    HPI     Home Medications Prior to Admission medications   Medication Sig Start Date End Date Taking? Authorizing Provider  nifedipine 0.3 % ointment Place 1 Application rectally 4 (four) times daily. 09/02/22   Loeffler, Adora Fridge, PA-C  paliperidone (INVEGA SUSTENNA) 156 MG/ML SUSY injection Inject 156 mg into the muscle every 30 (thirty) days. Patient not taking: Reported on 09/02/2022    [provider]  paliperidone (INVEGA SUSTENNA) 234 MG/1.5ML SUSY injection Inject 234 mg into the muscle once for 1 dose. Patient taking differently: Inject 234 mg into the muscle every 30 (thirty) days. 04/16/22 09/02/22  Tharon Aquas, NP  gabapentin (NEURONTIN) 400 MG capsule Take 1 capsule (400 mg total) by mouth 3 (three) times daily. Patient not taking: Reported on 06/09/2021 04/30/21 06/10/21  Ethelene Hal, NP      Allergies    Patient has no known allergies.    Review of Systems   Review of Systems  Gastrointestinal:  Positive for anal bleeding and rectal pain.   All other systems reviewed and are negative.   Physical Exam Updated Vital Signs BP 131/68   Pulse 62   Temp 97.7 F (36.5 C) (Oral)   Resp 14   Ht '5\' 8"'$  (1.727 m)   Wt 67.6 kg   SpO2 100%   BMI 22.66 kg/m  Physical Exam Vitals and nursing note reviewed. Exam conducted with a chaperone present Water engineer).  Constitutional:      General: He is not in acute distress.    Appearance: Normal appearance. He is normal weight. He is not ill-appearing.  HENT:     Head: Normocephalic and atraumatic.  Pulmonary:     Effort: Pulmonary effort is normal. No respiratory distress.  Abdominal:     General: Abdomen is flat.  Genitourinary:    Rectum: External hemorrhoid and internal hemorrhoid present.     Comments: Multiple small internal and external hemorrhoids noted. Mild tenderness to palpation on DRE. No masses.  Musculoskeletal:        General: Normal range of motion.     Cervical back: Neck supple.  Skin:    General: Skin is warm and dry.  Neurological:     Mental Status: He is alert and oriented to person, place, and time.  Psychiatric:        Mood and Affect: Mood normal.        Behavior: Behavior normal.    ED Results / Procedures / Treatments   Labs (all labs ordered are listed, but only abnormal results are displayed) Labs Reviewed - No  data to display  EKG None  Radiology No results found.  Procedures Procedures    Medications Ordered in ED Medications - No data to display  ED Course/ Medical Decision Making/ A&P                           Medical Decision Making Amount and/or Complexity of Data Reviewed Labs: ordered.  This patient presents to the ED for concern of rectal pain and bleeding, this involves an extensive number of treatment options, and is a complaint that carries with it a high risk of complications and morbidity. The differential diagnosis for lower GI bleed includes but is not limited to high flow upper GI bleed, diverticulosis or  radiculitis, vascular ectasia/arteriovenous malformation, inflammatory bowel disease, infectious colitis, mesenteric ischemia or ischemic colitis, Meckel's diverticulum, colorectal cancer or polyps, internal hemorrhoids, aortoenteric fistula, rectal foreign body, rectal ulceration or anal fissure.    Co morbidities that complicate the patient evaluation  Hemorrhoids, receptive anal intercourse  My initial workup includes STI panel  Additional history obtained from: Nursing notes from this visit. Prior ED visit on multiple visits per week  Afebrile, hemodynamically stable.  Patient complaining of rectal pain and itching as well as bright red blood on the toilet paper.  Symptom onset was yesterday.  Physical exam reveals multiple small internal and external hemorrhoids.  I believe these are the most likely cause of his pain as well as the source of bleeding due to absence of melena or hematochezia.  Patient does participate in receptive anal intercourse, however I see no masses or lesions around the anus.  Patient did request an STI panel.  This will be performed today.  Patient was given information on how to treat hemorrhoids at home.  Stable at discharge.  At this time there does not appear to be any evidence of an acute emergency medical condition and the patient appears stable for discharge with appropriate outpatient follow up. Diagnosis was discussed with patient who verbalizes understanding of care plan and is agreeable to discharge. I have discussed return precautions with patient who verbalizes understanding. Patient encouraged to follow-up with their PCP within 1 week. All questions answered.  Patient's case discussed with Dr. Alvino Chapel who agrees with plan to discharge with follow-up.   Note: Portions of this report may have been transcribed using voice recognition software. Every effort was made to ensure accuracy; however, inadvertent computerized transcription errors may still be  present.          Final Clinical Impression(s) / ED Diagnoses Final diagnoses:  None    Rx / DC Orders ED Discharge Orders     None         Nehemiah Massed 09/09/22 2154    [name], MD 09/10/22 1451

## 2022-09-10 ENCOUNTER — Emergency Department (HOSPITAL_COMMUNITY): Payer: Medicaid Other

## 2022-09-10 ENCOUNTER — Other Ambulatory Visit: Payer: Self-pay

## 2022-09-10 ENCOUNTER — Encounter (HOSPITAL_COMMUNITY): Payer: Self-pay | Admitting: Emergency Medicine

## 2022-09-10 ENCOUNTER — Emergency Department (HOSPITAL_COMMUNITY)
Admission: EM | Admit: 2022-09-10 | Discharge: 2022-09-10 | Disposition: A | Discharge status: Home / Self Care | Payer: Medicaid Other | Attending: Student | Admitting: Student

## 2022-09-10 DIAGNOSIS — S81831A Puncture wound without foreign body, right lower leg, initial encounter: Secondary | ICD-10-CM

## 2022-09-10 DIAGNOSIS — S79922A Unspecified injury of left thigh, initial encounter: Secondary | ICD-10-CM | POA: Diagnosis present

## 2022-09-10 DIAGNOSIS — F17210 Nicotine dependence, cigarettes, uncomplicated: Secondary | ICD-10-CM | POA: Insufficient documentation

## 2022-09-10 DIAGNOSIS — W3400XA Accidental discharge from unspecified firearms or gun, initial encounter: Secondary | ICD-10-CM | POA: Insufficient documentation

## 2022-09-10 DIAGNOSIS — Z23 Encounter for immunization: Secondary | ICD-10-CM | POA: Diagnosis not present

## 2022-09-10 DIAGNOSIS — S71112A Laceration without foreign body, left thigh, initial encounter: Secondary | ICD-10-CM | POA: Diagnosis not present

## 2022-09-10 DIAGNOSIS — D72829 Elevated white blood cell count, unspecified: Secondary | ICD-10-CM | POA: Insufficient documentation

## 2022-09-10 LAB — CBC WITH DIFFERENTIAL/PLATELET
Abs Immature Granulocytes: 0.16 10*3/uL — ABNORMAL HIGH (ref 0.00–0.07)
Basophils Absolute: 0.1 10*3/uL (ref 0.0–0.1)
Basophils Relative: 0 %
Eosinophils Absolute: 0 10*3/uL (ref 0.0–0.5)
Eosinophils Relative: 0 %
HCT: 42 % (ref 39.0–52.0)
Hemoglobin: 13.8 g/dL (ref 13.0–17.0)
Immature Granulocytes: 1 %
Lymphocytes Relative: 6 %
Lymphs Abs: 1.4 10*3/uL (ref 0.7–4.0)
MCH: 28.1 pg (ref 26.0–34.0)
MCHC: 32.9 g/dL (ref 30.0–36.0)
MCV: 85.5 fL (ref 80.0–100.0)
Monocytes Absolute: 1.2 10*3/uL — ABNORMAL HIGH (ref 0.1–1.0)
Monocytes Relative: 5 %
Neutro Abs: 19.1 10*3/uL — ABNORMAL HIGH (ref 1.7–7.7)
Neutrophils Relative %: 88 %
Platelets: 348 10*3/uL (ref 150–400)
RBC: 4.91 MIL/uL (ref 4.22–5.81)
RDW: 13.6 % (ref 11.5–15.5)
WBC: 21.9 10*3/uL — ABNORMAL HIGH (ref 4.0–10.5)
nRBC: 0 % (ref 0.0–0.2)

## 2022-09-10 LAB — COMPREHENSIVE METABOLIC PANEL
ALT: 21 U/L (ref 0–44)
AST: 42 U/L — ABNORMAL HIGH (ref 15–41)
Albumin: 4.3 g/dL (ref 3.5–5.0)
Alkaline Phosphatase: 60 U/L (ref 38–126)
Anion gap: 20 — ABNORMAL HIGH (ref 5–15)
BUN: 14 mg/dL (ref 6–20)
CO2: 18 mmol/L — ABNORMAL LOW (ref 22–32)
Calcium: 9.7 mg/dL (ref 8.9–10.3)
Chloride: 103 mmol/L (ref 98–111)
Creatinine, Ser: 1.35 mg/dL — ABNORMAL HIGH (ref 0.61–1.24)
GFR, Estimated: >60 mL/min (ref >60)
Glucose, Bld: 239 mg/dL — ABNORMAL HIGH (ref 70–99)
Potassium: 3.5 mmol/L (ref 3.5–5.1)
Sodium: 141 mmol/L (ref 135–145)
Total Bilirubin: 0.3 mg/dL (ref 0.3–1.2)
Total Protein: 8.1 g/dL (ref 6.5–8.1)

## 2022-09-10 LAB — RPR
RPR Ser Ql: REACTIVE — AB
RPR Titer: 1:32 {titer}

## 2022-09-10 MED ORDER — TETANUS-DIPHTH-ACELL PERTUSSIS 5-2.5-18.5 LF-MCG/0.5 IM SUSY
0.5000 mL | PREFILLED_SYRINGE | Freq: Once | INTRAMUSCULAR | Status: AC
  Administered 2022-09-10: 0.5 mL via INTRAMUSCULAR

## 2022-09-10 MED ORDER — FENTANYL CITRATE PF 50 MCG/ML IJ SOSY: 50.0000 ug | PREFILLED_SYRINGE | Freq: Once | INTRAMUSCULAR | Status: AC

## 2022-09-10 MED ORDER — FENTANYL CITRATE PF 50 MCG/ML IJ SOSY
PREFILLED_SYRINGE | INTRAMUSCULAR | Status: AC
  Administered 2022-09-10: 50 ug via INTRAVENOUS
  Filled 2022-09-10: qty 1

## 2022-09-10 MED ORDER — LIDOCAINE HCL (PF) 1 % IJ SOLN
5.0000 mL | Freq: Once | INTRAMUSCULAR | Status: AC
  Administered 2022-09-10: 5 mL via INTRADERMAL
  Filled 2022-09-10: qty 5
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
## 2022-09-10 NOTE — ED Notes (Signed)
RN reviewed discharge instructions with pt. Pt verbalized understanding and had no further questions. VSS upon discharge.  
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
## 2022-09-11 ENCOUNTER — Other Ambulatory Visit: Payer: Self-pay

## 2022-09-11 ENCOUNTER — Encounter (HOSPITAL_COMMUNITY): Payer: Self-pay | Admitting: Emergency Medicine

## 2022-09-11 ENCOUNTER — Emergency Department (HOSPITAL_COMMUNITY)
Admission: EM | Admit: 2022-09-11 | Discharge: 2022-09-11 | Disposition: A | Discharge status: Home / Self Care | Payer: Medicaid Other | Attending: Emergency Medicine | Admitting: Emergency Medicine

## 2022-09-11 DIAGNOSIS — W3400XA Accidental discharge from unspecified firearms or gun, initial encounter: Secondary | ICD-10-CM | POA: Diagnosis not present

## 2022-09-11 DIAGNOSIS — S81802A Unspecified open wound, left lower leg, initial encounter: Secondary | ICD-10-CM | POA: Insufficient documentation

## 2022-09-11 DIAGNOSIS — S8992XA Unspecified injury of left lower leg, initial encounter: Secondary | ICD-10-CM | POA: Diagnosis present

## 2022-09-11 DIAGNOSIS — S81832D Puncture wound without foreign body, left lower leg, subsequent encounter: Secondary | ICD-10-CM

## 2022-09-11 MED ORDER — ACETAMINOPHEN 325 MG PO TABS
650.0000 mg | ORAL_TABLET | Freq: Once | ORAL | Status: AC
  Administered 2022-09-11: 650 mg via ORAL
  Filled 2022-09-11: qty 2
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
## 2022-09-11 NOTE — ED Notes (Signed)
Wounds re-dressed and ACE wrap applied.  Pt given sandwich and ginger ale

## 2022-09-12 ENCOUNTER — Ambulatory Visit (HOSPITAL_COMMUNITY): Payer: Medicaid Other

## 2022-09-13 ENCOUNTER — Encounter (HOSPITAL_COMMUNITY): Payer: Self-pay | Admitting: Emergency Medicine

## 2022-09-13 ENCOUNTER — Emergency Department (HOSPITAL_COMMUNITY)
Admission: EM | Admit: 2022-09-13 | Discharge: 2022-09-13 | Disposition: A | Discharge status: Home / Self Care | Payer: Medicaid Other | Attending: Emergency Medicine | Admitting: Emergency Medicine

## 2022-09-13 ENCOUNTER — Ambulatory Visit (HOSPITAL_COMMUNITY)
Admission: EM | Admit: 2022-09-13 | Discharge: 2022-09-13 | Disposition: A | Discharge status: Home / Self Care | Payer: Medicaid Other | Attending: Licensed Clinical Social Worker | Admitting: Licensed Clinical Social Worker

## 2022-09-13 DIAGNOSIS — M79605 Pain in left leg: Secondary | ICD-10-CM | POA: Diagnosis present

## 2022-09-13 DIAGNOSIS — F1410 Cocaine abuse, uncomplicated: Secondary | ICD-10-CM | POA: Diagnosis not present

## 2022-09-13 DIAGNOSIS — F250 Schizoaffective disorder, bipolar type: Secondary | ICD-10-CM | POA: Insufficient documentation

## 2022-09-13 LAB — T.PALLIDUM AB, TOTAL: T Pallidum Abs: REACTIVE — AB

## 2022-09-13 MED ORDER — IBUPROFEN 800 MG PO TABS
800.0000 mg | ORAL_TABLET | Freq: Once | ORAL | Status: AC
  Administered 2022-09-13: 800 mg via ORAL
  Filled 2022-09-13: qty 1
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
## 2022-09-13 NOTE — Discharge Instructions (Addendum)
Continue to take ibuprofen for pain.Return to the ER for any new or worsening symptoms
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
## 2022-09-19 ENCOUNTER — Other Ambulatory Visit: Payer: Self-pay

## 2022-09-19 ENCOUNTER — Emergency Department (HOSPITAL_COMMUNITY)
Admission: EM | Admit: 2022-09-19 | Discharge: 2022-09-19 | Disposition: A | Discharge status: Home / Self Care | Payer: Medicaid Other | Attending: Emergency Medicine | Admitting: Emergency Medicine

## 2022-09-19 ENCOUNTER — Encounter (HOSPITAL_COMMUNITY): Payer: Self-pay | Admitting: Emergency Medicine

## 2022-09-19 DIAGNOSIS — Z20822 Contact with and (suspected) exposure to covid-19: Secondary | ICD-10-CM | POA: Diagnosis not present

## 2022-09-19 DIAGNOSIS — R443 Hallucinations, unspecified: Secondary | ICD-10-CM | POA: Diagnosis present

## 2022-09-19 DIAGNOSIS — F1420 Cocaine dependence, uncomplicated: Secondary | ICD-10-CM | POA: Insufficient documentation

## 2022-09-19 DIAGNOSIS — R45851 Suicidal ideations: Secondary | ICD-10-CM | POA: Insufficient documentation

## 2022-09-19 DIAGNOSIS — F250 Schizoaffective disorder, bipolar type: Secondary | ICD-10-CM | POA: Diagnosis not present

## 2022-09-19 LAB — SALICYLATE LEVEL: Salicylate Lvl: <7 mg/dL — ABNORMAL LOW (ref 7.0–30.0)

## 2022-09-19 LAB — COMPREHENSIVE METABOLIC PANEL
ALT: 29 U/L (ref 0–44)
AST: 32 U/L (ref 15–41)
Albumin: 3.7 g/dL (ref 3.5–5.0)
Alkaline Phosphatase: 57 U/L (ref 38–126)
Anion gap: 9 (ref 5–15)
BUN: 12 mg/dL (ref 6–20)
CO2: 25 mmol/L (ref 22–32)
Calcium: 9.6 mg/dL (ref 8.9–10.3)
Chloride: 103 mmol/L (ref 98–111)
Creatinine, Ser: 0.96 mg/dL (ref 0.61–1.24)
GFR, Estimated: >60 mL/min (ref >60)
Glucose, Bld: 83 mg/dL (ref 70–99)
Potassium: 3.5 mmol/L (ref 3.5–5.1)
Sodium: 137 mmol/L (ref 135–145)
Total Bilirubin: 0.3 mg/dL (ref 0.3–1.2)
Total Protein: 7.6 g/dL (ref 6.5–8.1)

## 2022-09-19 LAB — CBC
HCT: 38.3 % — ABNORMAL LOW (ref 39.0–52.0)
Hemoglobin: 12.6 g/dL — ABNORMAL LOW (ref 13.0–17.0)
MCH: 27.7 pg (ref 26.0–34.0)
MCHC: 32.9 g/dL (ref 30.0–36.0)
MCV: 84.2 fL (ref 80.0–100.0)
Platelets: 420 10*3/uL — ABNORMAL HIGH (ref 150–400)
RBC: 4.55 MIL/uL (ref 4.22–5.81)
RDW: 13.6 % (ref 11.5–15.5)
WBC: 10.4 10*3/uL (ref 4.0–10.5)
nRBC: 0 % (ref 0.0–0.2)

## 2022-09-19 LAB — RAPID URINE DRUG SCREEN, HOSP PERFORMED
Amphetamines: NOT DETECTED
Barbiturates: NOT DETECTED
Benzodiazepines: NOT DETECTED
Cocaine: POSITIVE — AB
Opiates: NOT DETECTED
Tetrahydrocannabinol: POSITIVE — AB

## 2022-09-19 LAB — RESP PANEL BY RT-PCR (FLU A&B, COVID) ARPGX2
Influenza A by PCR: NEGATIVE
Influenza B by PCR: NEGATIVE
SARS Coronavirus 2 by RT PCR: NEGATIVE

## 2022-09-19 LAB — ETHANOL: Alcohol, Ethyl (B): <10 mg/dL (ref <10)

## 2022-09-19 LAB — ACETAMINOPHEN LEVEL: Acetaminophen (Tylenol), Serum: <10 ug/mL — ABNORMAL LOW (ref 10–30)

## 2022-09-19 NOTE — ED Notes (Signed)
TTS in 46. NT sits with Pt.
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
## 2022-09-19 NOTE — ED Notes (Signed)
Pt verbalized understanding of d/c instructions, meds, and followup care. Denies questions. VSS, no distress noted. Steady gait to exit with all belongings. Given resource packet from SW and bus pass. Snack bag put together and toiletries given. Showers before leaving.

## 2022-09-19 NOTE — TOC CAGE-AID Note (Signed)
Transition of Care [facility]) - CAGE-AID Screening   Patient Details  Name: Tyler White MRN: [id number] Date of Birth: [birth year]  Transition of Care [facility]) CM/SW Contact:    Makinzy Cleere C Tarpley-Carter, [place] Phone Number: 09/19/2022, 7:35 PM   Clinical Narrative: Pt participated in [place].  Pt stated he does use substance.  Pt was offered resources, due to usage of substance.  Pt just stopped speaking with CSW.  CSW will provide pt with resources.  CSW also spoke with the pt about [employer] and [employer] for assistance with food and shelter.  CSW thanked pt for answering her questions.  Sandar Krinke Tarpley-Carter, MSW, LCSW-A Pronouns:  She/Her/Hers [facility] HealthTransitions of Care Clinical Social Worker Direct Number:  [phone] [email or web address]    CAGE-AID Screening:    Have You Ever Felt You Ought to Cut Down on Your Drinking or Drug Use?: Yes Have People Annoyed You By [employer] Your Drinking Or Drug Use?: Yes Have You Felt Bad Or Guilty About Your Drinking Or Drug Use?: No Have You Ever Had a Drink or Used Drugs First Thing In The Morning to Steady Your Nerves or to Get Rid of a Hangover?: Yes CAGE-AID Score: 3  Substance Abuse Education Offered: Yes  Substance abuse interventions: Scientist, clinical (histocompatibility and immunogenetics)

## 2022-09-19 NOTE — ED Provider Notes (Signed)
[facility] EMERGENCY DEPARTMENT Provider Note   CSN: [id number] Arrival date & time: 09/19/22  0416     History  Chief Complaint  Patient presents with   Hallucinations    Schizophrenia    Tyler White is a 26 y.o. male history of polysubstance use, schizophrenia not currently on any medication, here for evaluation of this ideation and hallucinations.  Patient states since yesterday he has been hearing voices telling him to harm himself.  States he plans to do this" multiple ways."  Also states that he has not eaten in 24 hours because the voices are telling him "not too."  He is requesting food currently.  He denies any visual hallucinations.  No recent injury or trauma.  Does admit to using cocaine yesterday.  Denies alcohol use.  No fever, cough, shortness of breath, abdominal pain.  HPI     Home Medications Prior to Admission medications   Medication Sig Start Date End Date Taking? Authorizing Provider  nifedipine 0.3 % ointment Place 1 Application rectally 4 (four) times daily. 09/02/22   Loeffler, Adora Fridge, PA-C  paliperidone (INVEGA SUSTENNA) 156 MG/ML SUSY injection Inject 156 mg into the muscle every 30 (thirty) days. Patient not taking: Reported on 09/02/2022    [provider]  paliperidone (INVEGA SUSTENNA) 234 MG/1.5ML SUSY injection Inject 234 mg into the muscle once for 1 dose. Patient taking differently: Inject 234 mg into the muscle every 30 (thirty) days. 04/16/22 09/02/22  Tharon Aquas, NP  gabapentin (NEURONTIN) 400 MG capsule Take 1 capsule (400 mg total) by mouth 3 (three) times daily. Patient not taking: Reported on 06/09/2021 04/30/21 06/10/21  Ethelene Hal, NP      Allergies    Patient has no known allergies.    Review of Systems   Review of Systems  Constitutional: Negative.   HENT: Negative.    Respiratory: Negative.    Cardiovascular: Negative.   Gastrointestinal: Negative.   Genitourinary: Negative.   Skin:  Negative.   Neurological: Negative.   Psychiatric/Behavioral:  Positive for hallucinations, sleep disturbance and suicidal ideas. Negative for agitation, behavioral problems, confusion, decreased concentration, dysphoric mood and self-injury. The patient is not nervous/anxious and is not hyperactive.   All other systems reviewed and are negative.   Physical Exam Updated Vital Signs BP 112/61 (BP Location: Left Arm)   Pulse 67   Temp 97.8 F (36.6 C) (Oral)   Resp 16   SpO2 97%  Physical Exam Vitals and nursing note reviewed.  Constitutional:      General: He is not in acute distress.    Appearance: He is well-developed. He is not ill-appearing, toxic-appearing or diaphoretic.  HENT:     Head: Normocephalic and atraumatic.  Eyes:     Pupils: Pupils are equal, round, and reactive to light.  Cardiovascular:     Rate and Rhythm: Normal rate and regular rhythm.  Pulmonary:     Effort: Pulmonary effort is normal. No respiratory distress.  Abdominal:     General: There is no distension.     Palpations: Abdomen is soft.  Musculoskeletal:        General: Normal range of motion.     Cervical back: Normal range of motion and neck supple.  Skin:    General: Skin is warm and dry.  Neurological:     General: No focal deficit present.     Mental Status: He is alert and oriented to person, place, and time.  Psychiatric:  Attention and Perception: Attention normal. He perceives auditory hallucinations.        Mood and Affect: Affect is flat.        Speech: Speech normal.        Behavior: Behavior is withdrawn. Behavior is cooperative.        Thought Content: Thought content is not paranoid or delusional. Thought content includes suicidal ideation. Thought content does not include homicidal ideation. Thought content includes suicidal plan. Thought content does not include homicidal plan.     Comments: Flat affect admits to SI with Plan      ED Results / Procedures / Treatments    Labs (all labs ordered are listed, but only abnormal results are displayed) Labs Reviewed  CBC - Abnormal; Notable for the following components:      Result Value   Hemoglobin 12.6 (*)    HCT 38.3 (*)    Platelets 420 (*)    All other components within normal limits  RAPID URINE DRUG SCREEN, HOSP PERFORMED - Abnormal; Notable for the following components:   Cocaine POSITIVE (*)    Tetrahydrocannabinol POSITIVE (*)    All other components within normal limits  SALICYLATE LEVEL - Abnormal; Notable for the following components:   Salicylate Lvl <6.2 (*)    All other components within normal limits  ACETAMINOPHEN LEVEL - Abnormal; Notable for the following components:   Acetaminophen (Tylenol), Serum <10 (*)    All other components within normal limits  RESP PANEL BY RT-PCR (FLU A&B, COVID) ARPGX2  COMPREHENSIVE METABOLIC PANEL  ETHANOL    EKG None  Radiology No results found.  Procedures Procedures    Medications Ordered in ED Medications - No data to display  ED Course/ Medical Decision Making/ A&P    26 year old history of schizophrenia not on any current medication, polysubstance use with multiple ED visits, 42 for the last 6 months here for evaluation of audio hallucinations and SI with plan.  He will not expound on his plan.  States he has been hearing voices over the last 24 hours.  Denies any visual hallucinations.  He appears otherwise well.  States the voices of been telling him to not eat or drink anything over the last 24 hours however he is requesting food and drink currently.  Appears clinically well-hydrated.  Labs and imaging personally viewed and interpreted:  UDS positive for THC, cocaine Ethanol within normal limits CBC without leukocytosis, hgb 12.6 similar to prior CMP without significant abnormality  Patient here with hallucinations and SI.  He appears clinically well.  Suspect this is likely due to his polysubstance use as well as his untreated  schizophrenia.  Low suspicion for acute traumatic injury, infectious process as cause of his hallucinations.  Patient medically cleared. Dispo per psychiatry                           Medical Decision Making Amount and/or Complexity of Data Reviewed External Data Reviewed: labs, radiology and notes. Labs: ordered. Decision-making details documented in ED Course.  Risk OTC drugs. Prescription drug management. Diagnosis or treatment significantly limited by social determinants of health.         Final Clinical Impression(s) / ED Diagnoses Final diagnoses:  Hallucinations    Rx / DC Orders ED Discharge Orders     None         Khylee Algeo A, PA-C 09/19/22 1423    Wyvonnia Dusky, MD 09/19/22 [phone]

## 2022-09-19 NOTE — ED Notes (Signed)
Belongings in Visitor 3 locker

## 2022-09-19 NOTE — ED Notes (Signed)
SW called to see patient.

## 2022-09-19 NOTE — ED Triage Notes (Signed)
Patient reports auditory hallucinations this week , denies SI or HI , history of schizophrenia with no medications/homeless .

## 2022-09-19 NOTE — ED Provider Notes (Signed)
Patient seen by psychiatry and cleared for discharge.  Patient agreeable to this on my assessment   Lacretia Leigh, MD 09/19/22 1919

## 2022-09-19 NOTE — ED Notes (Signed)
Pt walking down hall. Redirected back to back. Pt asks about food. Meal tray ordered. Returns to bed and sleeps.

## 2022-09-24 ENCOUNTER — Encounter (HOSPITAL_COMMUNITY): Payer: Self-pay | Admitting: Emergency Medicine

## 2022-09-24 ENCOUNTER — Emergency Department (HOSPITAL_COMMUNITY)
Admission: EM | Admit: 2022-09-24 | Discharge: 2022-09-25 | Disposition: A | Discharge status: Home / Self Care | Payer: Medicaid Other | Attending: Emergency Medicine | Admitting: Emergency Medicine

## 2022-09-24 ENCOUNTER — Other Ambulatory Visit: Payer: Self-pay

## 2022-09-24 DIAGNOSIS — Z4800 Encounter for change or removal of nonsurgical wound dressing: Secondary | ICD-10-CM | POA: Insufficient documentation

## 2022-09-24 DIAGNOSIS — Z4802 Encounter for removal of sutures: Secondary | ICD-10-CM | POA: Insufficient documentation

## 2022-09-24 DIAGNOSIS — Z5189 Encounter for other specified aftercare: Secondary | ICD-10-CM
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
## 2022-09-25 MED ORDER — NAPROXEN 250 MG PO TABS
500.0000 mg | ORAL_TABLET | Freq: Once | ORAL | Status: AC
  Administered 2022-09-25: 500 mg via ORAL
  Filled 2022-09-25: qty 2
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
## 2022-09-25 NOTE — Discharge Instructions (Signed)
Continue tylenol or ibuprofen for pain. Follow up with a primary care doctor.

## 2022-10-01 ENCOUNTER — Other Ambulatory Visit: Payer: Self-pay

## 2022-10-01 ENCOUNTER — Emergency Department (HOSPITAL_COMMUNITY): Payer: Medicaid Other

## 2022-10-01 ENCOUNTER — Encounter (HOSPITAL_COMMUNITY): Payer: Self-pay

## 2022-10-01 ENCOUNTER — Emergency Department (HOSPITAL_COMMUNITY)
Admission: EM | Admit: 2022-10-01 | Discharge: 2022-10-01 | Discharge status: Against Medical Advice | Payer: Medicaid Other | Attending: Emergency Medicine | Admitting: Emergency Medicine

## 2022-10-01 DIAGNOSIS — Z5321 Procedure and treatment not carried out due to patient leaving prior to being seen by health care provider: Secondary | ICD-10-CM | POA: Insufficient documentation

## 2022-10-01 DIAGNOSIS — M79673 Pain in unspecified foot: Secondary | ICD-10-CM | POA: Diagnosis present

## 2022-10-01 NOTE — ED Triage Notes (Signed)
Patient was found trespassing at [employer]. When [employer] arrived patient stated his foot was hurting and needed EMS.

## 2022-10-02 ENCOUNTER — Other Ambulatory Visit: Payer: Self-pay

## 2022-10-02 ENCOUNTER — Emergency Department (HOSPITAL_COMMUNITY)
Admission: EM | Admit: 2022-10-02 | Discharge: 2022-10-02 | Disposition: A | Discharge status: Home / Self Care | Payer: Medicaid Other | Attending: Emergency Medicine | Admitting: Emergency Medicine

## 2022-10-02 ENCOUNTER — Encounter (HOSPITAL_COMMUNITY): Payer: Self-pay | Admitting: Emergency Medicine

## 2022-10-02 DIAGNOSIS — Z20822 Contact with and (suspected) exposure to covid-19: Secondary | ICD-10-CM | POA: Insufficient documentation

## 2022-10-02 DIAGNOSIS — R6889 Other general symptoms and signs: Secondary | ICD-10-CM | POA: Diagnosis not present

## 2022-10-02 DIAGNOSIS — M791 Myalgia, unspecified site: Secondary | ICD-10-CM | POA: Diagnosis present

## 2022-10-02 LAB — SARS CORONAVIRUS 2 BY RT PCR: SARS Coronavirus 2 by RT PCR: NEGATIVE

## 2022-10-02 MED ORDER — ACETAMINOPHEN 500 MG PO TABS
1000.0000 mg | ORAL_TABLET | Freq: Once | ORAL | Status: AC
  Administered 2022-10-02: 1000 mg via ORAL
  Filled 2022-10-02: qty 2

## 2022-10-02 NOTE — ED Provider Triage Note (Signed)
  Emergency Medicine Provider Triage Evaluation Note  MRN:  [id number]  Arrival date & time: 10/02/22    Medically screening exam initiated at 4:32 AM.   CC:   Body aches  HPI:  Tyler White is a 26 y.o. year-old male presents to the ED with chief complaint of body aches.  Onset tonight PTA.  Reports associated cough.  Denies measured fever.  History provided by patient. ROS:  -As included in HPI PE:   Vitals:   10/02/22 0217 10/02/22 0409  BP: 126/80 133/77  Pulse: 84 78  Resp: 17 17  Temp: 98.9 F (37.2 C) 98.1 F (36.7 C)  SpO2: 98% 98%    Non-toxic appearing No respiratory distress  MDM:  Based on signs and symptoms, COVID is highest on my differential, followed by URI. I've ordered covid swab in triage to expedite lab/diagnostic workup.  Patient was informed that the remainder of the evaluation will be completed by another provider, this initial triage assessment does not replace that evaluation, and the importance of remaining in the ED until their evaluation is complete.    Montine Circle, PA-C 10/02/22 [phone]

## 2022-10-02 NOTE — ED Triage Notes (Signed)
Patient reports generalized body aches /fatigue this evening .

## 2022-10-02 NOTE — ED Provider Notes (Signed)
  [facility] Emergency Department Provider Note MRN:  [id number]  Arrival date & time: 10/02/22     Chief Complaint   Body Aches   History of Present Illness   Tyler White is a 26 y.o. year-old male presents to the ED with chief complaint of body aches.  States that symptoms just started tonight.  Denies any successful treatments PTA.  Denies fever, chills, cough, n/v/d.  History provided by patient.   Review of Systems  Pertinent positive and negative review of systems noted in HPI.    Physical Exam   Vitals:   10/02/22 0217 10/02/22 0409  BP: 126/80 133/77  Pulse: 84 78  Resp: 17 17  Temp: 98.9 F (37.2 C) 98.1 F (36.7 C)  SpO2: 98% 98%    CONSTITUTIONAL:  well-appearing, NAD NEURO:  Alert and oriented x 3, CN 3-12 grossly intact EYES:  eyes equal and reactive ENT/NECK:  Supple, no stridor  CARDIO:  appears well-perfused  PULM:  No respiratory distress,  GI/GU:  non-distended,  MSK/SPINE:  No gross deformities, no edema, moves all extremities  SKIN:  no rash, atraumatic   *Additional and/or pertinent findings included in MDM below  Diagnostic and Interventional Summary    EKG Interpretation  Date/Time:    Ventricular Rate:    PR Interval:    QRS Duration:   QT Interval:    QTC Calculation:   R Axis:     Text Interpretation:         Labs Reviewed  SARS CORONAVIRUS 2 BY RT PCR    No orders to display    Medications  acetaminophen (TYLENOL) tablet 1,000 mg (has no administration in time range)     Procedures  /  Critical Care Procedures  ED Course and Medical Decision Making  I have reviewed the triage vital signs, the nursing notes, and pertinent available records from the EMR.  Social Determinants Affecting Complexity of Care: Patient has no clinically significant social determinants affecting this chief complaint..   ED Course:    Medical Decision Making Patient here with body aches that started just  before arrival.  VSS.  Non-toxic appearing.  COVID negative.  Recommend outpatient follow-up.  Risk OTC drugs.     Consultants: No consultations were needed in caring for this patient.   Treatment and Plan: Emergency department workup does not suggest an emergent condition requiring admission or immediate intervention beyond  what has been performed at this time. The patient is safe for discharge and has  been instructed to return immediately for worsening symptoms, change in  symptoms or any other concerns    Final Clinical Impressions(s) / ED Diagnoses     ICD-10-CM   1. Flu-like symptoms  R68.89       ED Discharge Orders     None         Discharge Instructions Discussed with and Provided to Patient:   Discharge Instructions   None      Montine Circle, PA-C 10/02/22 0423    Merryl Hacker, MD 10/03/22 [phone]

## 2022-10-03 ENCOUNTER — Other Ambulatory Visit: Payer: Self-pay

## 2022-10-03 ENCOUNTER — Ambulatory Visit (HOSPITAL_COMMUNITY)
Admission: EM | Admit: 2022-10-03 | Discharge: 2022-10-03 | Disposition: A | Discharge status: Home / Self Care | Payer: Medicaid Other | Attending: Psychiatry | Admitting: Psychiatry

## 2022-10-03 DIAGNOSIS — Z20822 Contact with and (suspected) exposure to covid-19: Secondary | ICD-10-CM | POA: Insufficient documentation

## 2022-10-03 DIAGNOSIS — Z5900 Homelessness unspecified: Secondary | ICD-10-CM | POA: Insufficient documentation

## 2022-10-03 DIAGNOSIS — F259 Schizoaffective disorder, unspecified: Secondary | ICD-10-CM | POA: Insufficient documentation

## 2022-10-03 DIAGNOSIS — Z765 Malingerer [conscious simulation]: Secondary | ICD-10-CM

## 2022-10-03 DIAGNOSIS — R45851 Suicidal ideations: Secondary | ICD-10-CM | POA: Insufficient documentation

## 2022-10-03 DIAGNOSIS — F17210 Nicotine dependence, cigarettes, uncomplicated: Secondary | ICD-10-CM | POA: Insufficient documentation

## 2022-10-03 DIAGNOSIS — Z91148 Patient's other noncompliance with medication regimen for other reason: Secondary | ICD-10-CM | POA: Insufficient documentation

## 2022-10-03 LAB — COMPREHENSIVE METABOLIC PANEL
ALT: 27 U/L (ref 0–44)
AST: 42 U/L — ABNORMAL HIGH (ref 15–41)
Albumin: 3.7 g/dL (ref 3.5–5.0)
Alkaline Phosphatase: 49 U/L (ref 38–126)
Anion gap: 12 (ref 5–15)
BUN: 17 mg/dL (ref 6–20)
CO2: 22 mmol/L (ref 22–32)
Calcium: 9.2 mg/dL (ref 8.9–10.3)
Chloride: 102 mmol/L (ref 98–111)
Creatinine, Ser: 1.1 mg/dL (ref 0.61–1.24)
GFR, Estimated: >60 mL/min (ref >60)
Glucose, Bld: 96 mg/dL (ref 70–99)
Potassium: 3.5 mmol/L (ref 3.5–5.1)
Sodium: 136 mmol/L (ref 135–145)
Total Bilirubin: 0.4 mg/dL (ref 0.3–1.2)
Total Protein: 7.2 g/dL (ref 6.5–8.1)

## 2022-10-03 LAB — CBC WITH DIFFERENTIAL/PLATELET
Abs Immature Granulocytes: 0.01 10*3/uL (ref 0.00–0.07)
Basophils Absolute: 0 10*3/uL (ref 0.0–0.1)
Basophils Relative: 1 %
Eosinophils Absolute: 0.1 10*3/uL (ref 0.0–0.5)
Eosinophils Relative: 1 %
HCT: 38.5 % — ABNORMAL LOW (ref 39.0–52.0)
Hemoglobin: 12.9 g/dL — ABNORMAL LOW (ref 13.0–17.0)
Immature Granulocytes: 0 %
Lymphocytes Relative: 22 %
Lymphs Abs: 1.4 10*3/uL (ref 0.7–4.0)
MCH: 27.7 pg (ref 26.0–34.0)
MCHC: 33.5 g/dL (ref 30.0–36.0)
MCV: 82.8 fL (ref 80.0–100.0)
Monocytes Absolute: 0.7 10*3/uL (ref 0.1–1.0)
Monocytes Relative: 11 %
Neutro Abs: 4.3 10*3/uL (ref 1.7–7.7)
Neutrophils Relative %: 65 %
Platelets: 368 10*3/uL (ref 150–400)
RBC: 4.65 MIL/uL (ref 4.22–5.81)
RDW: 13.6 % (ref 11.5–15.5)
WBC: 6.5 10*3/uL (ref 4.0–10.5)
nRBC: 0 % (ref 0.0–0.2)

## 2022-10-03 LAB — RESP PANEL BY RT-PCR (FLU A&B, COVID) ARPGX2
Influenza A by PCR: NEGATIVE
Influenza B by PCR: NEGATIVE
SARS Coronavirus 2 by RT PCR: NEGATIVE

## 2022-10-03 LAB — TSH: TSH: 1.945 u[IU]/mL (ref 0.350–4.500)

## 2022-10-03 LAB — POC SARS CORONAVIRUS 2 AG: SARSCOV2ONAVIRUS 2 AG: NEGATIVE

## 2022-10-03 MED ORDER — ALUM & MAG HYDROXIDE-SIMETH 200-200-20 MG/5ML PO SUSP: 30.0000 mL | ORAL | Status: DC | PRN

## 2022-10-03 MED ORDER — MAGNESIUM HYDROXIDE 400 MG/5ML PO SUSP: 30.0000 mL | Freq: Every day | ORAL | Status: DC | PRN

## 2022-10-03 MED ORDER — PALIPERIDONE PALMITATE ER 234 MG/1.5ML IM SUSY
234.0000 mg | PREFILLED_SYRINGE | Freq: Once | INTRAMUSCULAR | Status: AC
  Administered 2022-10-03: 234 mg via INTRAMUSCULAR

## 2022-10-03 MED ORDER — ACETAMINOPHEN 325 MG PO TABS: 650.0000 mg | ORAL_TABLET | Freq: Four times a day (QID) | ORAL | Status: DC | PRN

## 2022-10-03 NOTE — ED Provider Notes (Signed)
FBC/OBS ASAP Discharge Summary  Date and Time: 10/03/2022 1:04 PM  Name: Tyler White  MRN:  [id number]   Discharge Diagnoses:  Final diagnoses:  Suicidal ideation  H/O medication noncompliance  Homelessness    Subjective: "Suicidal thoughts"  Stay Summary:  Patient with history of schizoaffective disorder, substance use disorder, malingering, presented here to [facility] voluntarily as a walk-in this morning. Patient initially reported experiencing suicidal thoughts. Patient denies an active plan for self-harm. Patient admits to being homeless and has utilized the ER for sleeping over the last few months. Patient has had 48 Emergency Department visits within the past 6 months.  He has an extensive history of medication non-compliance however, is agreeable to receiving [place] prior to discharge today. He receive his last injection of Invega on 08/11/22.  During evaluation Tyler White is laying in no acute distress. He is alert, oriented x 4, calm, cooperative and attentive.  Her mood is euthymic with congruent affect. She has normal speech, and behavior.  Objectively there is no evidence of psychosis/mania or delusional thinking.  Patient is able to converse coherently, goal directed thoughts, no distractibility, or pre-occupation. He denies suicidal/self-harm/homicidal ideation, psychosis, and paranoia.  Patient doesn't appear to be responding to internal stimuli. Patient answered question appropriately. Patient requesting food during evaluation.  Patient is able to contract for safety and would like housing resources and bus pass at discharge.  Total Time spent with patient: 20 minutes  Past Psychiatric History: Multiple ER visits within 6 months for SI and Hallucinations, suspicious for malingering as patient is homeless  Prescribed Wandra Feinstein, last dose given during admission here at [facility] 08/11/22 Last inpatient admission [facility] 01/29/2022, Schizoaffective disorder and Stimulant  use Disorder   Past Medical History:  Past Medical History:  Diagnosis Date   Hernia, inguinal, right    Inguinal hernia    right   Psychiatric illness    Schizophrenia ([place])    No past surgical history on file. Family History:  Family History  Problem Relation Age of Onset   Psychiatric Illness Mother    Hypertension Sister     Social History:  Social History   Substance and Sexual Activity  Alcohol Use Not Currently     Social History   Substance and Sexual Activity  Drug Use Yes   Types: Marijuana, Cocaine, "Crack" cocaine    Social History   Socioeconomic History   Marital status: Single    Spouse name: Not on file   Number of children: Not on file   Years of education: 10   Highest education level: Not on file  Occupational History   Not on file  Tobacco Use   Smoking status: Every Day    Packs/day: 0.50    Years: 5.00    Total pack years: 2.50    Types: Cigarettes   Smokeless tobacco: Never  Vaping Use   Vaping Use: Never used  Substance and Sexual Activity   Alcohol use: Not Currently   Drug use: Yes    Types: Marijuana, Cocaine, "Crack" cocaine   Sexual activity: Yes    Birth control/protection: None  Other Topics Concern   Not on file  Social History Narrative   ** Merged History Encounter **       ** Merged History Encounter **       ** Merged History Encounter **       Social Determinants of Radio broadcast assistant Strain: Not on file  Food Insecurity: Not on file  Transportation Needs: Not on file  Physical Activity: Not on file  Stress: Not on file  Social Connections: Not on file   SDOH:  SDOH Screenings   Alcohol Screen: Low Risk  (01/29/2022)  Depression (PHQ2-9): High Risk (09/19/2022)  Tobacco Use: High Risk (10/02/2022)    Tobacco Cessation:  N/A, patient does not currently use tobacco products  Current Medications:  Current Facility-Administered Medications  Medication Dose Route Frequency Provider Last Rate  Last Admin   acetaminophen (TYLENOL) tablet 650 mg  650 mg Oral Q6H PRN Evette Georges, NP       alum & mag hydroxide-simeth (MAALOX/MYLANTA) 200-200-20 MG/5ML suspension 30 mL  30 mL Oral Q4H PRN Evette Georges, NP       magnesium hydroxide (MILK OF MAGNESIA) suspension 30 mL  30 mL Oral Daily PRN Evette Georges, NP       Current Outpatient Medications  Medication Sig Dispense Refill   nifedipine 0.3 % ointment Place 1 Application rectally 4 (four) times daily. 30 g 0   paliperidone (INVEGA SUSTENNA) 234 MG/1.5ML SUSY injection Inject 234 mg into the muscle once for 1 dose. (Patient taking differently: Inject 234 mg into the muscle every 30 (thirty) days.) 1.5 mL 0    PTA Medications: (Not in a hospital admission)      09/19/2022    6:53 PM 06/19/2022    6:37 AM 06/07/2022    9:00 AM  Depression screen PHQ 2/9  Decreased Interest '2 3 3  '$ Down, Depressed, Hopeless '2 3 3  '$ PHQ - 2 Score '4 6 6  '$ Altered sleeping '3 2 1  '$ Tired, decreased energy '2 1 1  '$ Change in appetite 3 1 0  Feeling bad or failure about yourself   3 3  Trouble concentrating '1 2 1  '$ Moving slowly or fidgety/restless '1 1 3  '$ Suicidal thoughts '1 2 3  '$ PHQ-9 Score '15 18 18  '$ Difficult doing work/chores Somewhat difficult Very difficult Somewhat difficult    Flowsheet Row ED from 10/03/2022 in [facility] ED from 10/02/2022 in [facility] from 10/01/2022 in [facility] DEPT  C-SSRS RISK CATEGORY High Risk No Risk No Risk       Musculoskeletal  Strength & Muscle Tone: within normal limits Gait & Station: normal Patient leans: N/A  Psychiatric Specialty Exam  Presentation  General Appearance:  Appropriate for Environment  Eye Contact: Fair  Speech: Clear and Coherent  Speech Volume: Normal  Handedness: Right   Mood and Affect  Mood: Anxious; Depressed  Affect: Congruent   Thought Process  Thought  Processes: Coherent  Descriptions of Associations:Intact  Orientation:Full (Time, Place and Person)  Thought Content:Illogical  Diagnosis of Schizophrenia or Schizoaffective disorder in past: Yes  Duration of Psychotic Symptoms: Greater than six months   Hallucinations:Hallucinations: Auditory Description of Auditory Hallucinations: I hear voices " they tell me different stuff"  Ideas of Reference:None  Suicidal Thoughts:Suicidal Thoughts: Yes, Passive SI Active Intent and/or Plan: Without Plan SI Passive Intent and/or Plan: Without Plan; Without Intent  Homicidal Thoughts:Homicidal Thoughts: No   Sensorium  Memory: Immediate Fair  Judgment: Poor  Insight: Poor   Executive Functions  Concentration: Fair  Attention Span: Fair  Recall: [place] of Knowledge: Fair  Language: Fair   Psychomotor Activity  Psychomotor Activity: Psychomotor Activity: Normal   Assets  Assets: Armed forces logistics/support/administrative officer; Desire for Improvement   Sleep  Sleep: Sleep: Good Number of Hours of Sleep: 4  Nutritional Assessment (For OBS and FBC admissions only) Has the patient had a weight loss or gain of 10 pounds or more in the last 3 months?: No Has the patient had a decrease in food intake/or appetite?: Yes Does the patient have dental problems?: No Does the patient have eating habits or behaviors that may be indicators of an eating disorder including binging or inducing vomiting?: No Has the patient recently lost weight without trying?: 0 Has the patient been eating poorly because of a decreased appetite?: 0 Malnutrition Screening Tool Score: 0    Physical Exam  Physical Exam Constitutional:      Appearance: Normal appearance.  Eyes:     Extraocular Movements: Extraocular movements intact.     Conjunctiva/sclera: Conjunctivae normal.     Pupils: Pupils are equal, round, and reactive to light.  Cardiovascular:     Rate and Rhythm: Normal rate.  Pulmonary:      Effort: Pulmonary effort is normal.  Skin:    General: Skin is warm and dry.     Capillary Refill: Capillary refill takes less than 2 seconds.  Neurological:     General: No focal deficit present.     Mental Status: He is alert.  Psychiatric:        Mood and Affect: Mood normal.        Behavior: Behavior normal.    Review of Systems  Psychiatric/Behavioral: Negative.     Blood pressure 135/81, pulse 70, temperature 98 F (36.7 C), temperature source Oral, resp. rate 18, SpO2 97 %. There is no height or weight on file to calculate BMI.  Demographic Factors:  Male, Low socioeconomic status, and homelessness  Loss Factors: Financial problems/change in socioeconomic status  Historical Factors: NA  Risk Reduction Factors:   Knowledge of how to access crisis resources and emergency services in the event an emergent mental health crisis arises.  Continued Clinical Symptoms:  Schizophrenia:   Less than 18 years old More than one psychiatric diagnosis Previous Psychiatric Diagnoses and Treatments  Cognitive Features That Contribute To Risk:  Thought constriction (tunnel vision)    Suicide Risk:  Minimal: No identifiable suicidal ideation.  Patients presenting with no risk factors but with morbid ruminations; may be classified as minimal risk based on the severity of the depressive symptoms  Plan Of Care/Follow-up recommendations:  Other:  Follow-up with [facility] outpatient services for medication management. Next injection due in 30 days. Patient has "no showed" several previously scheduled appointments, therefore outpatient [place] recommended that patient walk-in to establish psychiatric service during open access clinic hours.   Disposition: Home/Self-Care   Molli Barrows, FNP, PMHNP-BC  10/03/2022, 1:04 PM

## 2022-10-03 NOTE — ED Notes (Signed)
Patient observed sleeping but easy to arouse. Patient endorses passive SI with no plan and denies A/V/H at this current moment. Patient denies any pain or discomfort. Patient informed about UDS still required. Patient stated he will attempt to provide with urine sample.
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
## 2022-10-03 NOTE — ED Provider Notes (Signed)
[facility] Continuous Assessment Admission H&P  Date: 10/03/22 Patient Name: Tyler White MRN: [id number] Chief Complaint:  Chief Complaint  Patient presents with   Schizophrenia      Diagnoses:  Final diagnoses:  Suicidal ideation  H/O medication noncompliance  Homelessness    HPI: Tyler White 26 y.o male with a history of schizophrenia medication noncompliance, homelessness.  Presented to [facility] voluntarily.  Patient complaining of suicide ideation with no plans.  Patient also stated he has not taken his medicine.  According to patient he was scheduled to have his monthly injection but he never showed up.  TTS triage notes:  Pt said he walked to [facility] due to suicidal ideations.  He said it has something to do with "being schizoaffective."  He has no current plan on how he wants to kill himself.  Pt says he has had two previous attempts.  Patient says he has been hearing voices but that "I am used to them."  Pt has been "seeing dark circles."  He denies current HI.   Pt reports appetite has been okay but he has been not having access to food.  Patient says "I have been sleeping in the emergency room.:"  Pt denies having cocaine lately.    Face-to-face observation of patient, patient is alert and oriented x 4, is decreased.  Open and turned the room patient was observed with an on the chair, patient appearance is disheveled and unkept.  Mood is anxious and depressed affect is flat.  Patient endorsed suicidal ideations with no immediate plans, denies HI, reports auditory hallucination that he heard voices but could not tell specific what they were.  According to patient he is mental right now.    Recommend inpatient observation   [place] 2-9:  [place] ED from 09/19/2022 in [place] ED from 06/19/2022 in [facility] ED from 06/05/2022 in [facility]  Thoughts that you would be better  off dead, or of hurting yourself in some way Several days More than half the days Nearly every day  PHQ-9 Total Score '15 18 18       '$ Flowsheet Row ED from 10/03/2022 in [facility] ED from 10/02/2022 in [place] ED from 10/01/2022 in [facility] DEPT  C-SSRS RISK CATEGORY High Risk No Risk No Risk        Total Time spent with patient: 20 minutes  Musculoskeletal  Strength & Muscle Tone: within normal limits Gait & Station: normal Patient leans: N/A  Psychiatric Specialty Exam  Presentation General Appearance:  Disheveled  Eye Contact: Good  Speech: Garbled  Speech Volume: Decreased  Handedness: Right   Mood and Affect  Mood: Anxious; Depressed  Affect: Congruent   Thought Process  Thought Processes: Coherent  Descriptions of Associations:Intact  Orientation:Full (Time, Place and Person)  Thought Content:Illogical  Diagnosis of Schizophrenia or Schizoaffective disorder in past: Yes  Duration of Psychotic Symptoms: Greater than six months  Hallucinations:Hallucinations: Visual Description of Auditory Hallucinations: i hear voices  Ideas of Reference:None  Suicidal Thoughts:Suicidal Thoughts: Yes, Active SI Active Intent and/or Plan: Without Plan SI Passive Intent and/or Plan: Without Plan  Homicidal Thoughts:Homicidal Thoughts: No   Sensorium  Memory: Immediate Fair  Judgment: Poor  Insight: Fair   Materials engineer: Fair  Attention Span: Fair  Recall: [employer] of Knowledge: Fair  Language: Fair   Psychomotor Activity  Psychomotor Activity: Psychomotor Activity: Normal   Assets  Assets: Desire for Improvement; Housing   Sleep  Sleep: Sleep: Poor Number of Hours of Sleep: 4   Nutritional Assessment (For OBS and FBC admissions only) Has the patient had a weight loss or gain of 10 pounds or more  in the last 3 months?: No Has the patient had a decrease in food intake/or appetite?: Yes Does the patient have dental problems?: No Does the patient have eating habits or behaviors that may be indicators of an eating disorder including binging or inducing vomiting?: No Has the patient recently lost weight without trying?: 0 Has the patient been eating poorly because of a decreased appetite?: 0 Malnutrition Screening Tool Score: 0    Physical Exam HENT:     Head: Normocephalic.     Nose: Nose normal.  Cardiovascular:     Rate and Rhythm: Normal rate.  Pulmonary:     Effort: Pulmonary effort is normal.  Musculoskeletal:        General: Normal range of motion.  Skin:    General: Skin is warm.  Neurological:     General: No focal deficit present.     Mental Status: He is alert.  Psychiatric:        Mood and Affect: Mood normal.        Behavior: Behavior normal.        Thought Content: Thought content normal.        Judgment: Judgment normal.    Review of Systems  Constitutional: Negative.   HENT: Negative.    Eyes: Negative.   Respiratory: Negative.    Cardiovascular: Negative.   Gastrointestinal: Negative.   Genitourinary: Negative.   Musculoskeletal: Negative.   Skin: Negative.   Neurological: Negative.   Endo/Heme/Allergies: Negative.   Psychiatric/Behavioral:  Positive for substance abuse and suicidal ideas. The patient is nervous/anxious.     Blood pressure 135/81, pulse 70, temperature 98 F (36.7 C), temperature source Oral, resp. rate 18, SpO2 97 %. There is no height or weight on file to calculate BMI.  Past Psychiatric History: schizophrenia,  homelessness   Is the patient at risk to self? Yes  Has the patient been a risk to self in the past 6 months? Yes .    Has the patient been a risk to self within the distant past? Yes   Is the patient a risk to others? No   Has the patient been a risk to others in the past 6 months? No   Has the patient been a  risk to others within the distant past? No   Past Medical History:  Past Medical History:  Diagnosis Date   Hernia, inguinal, right    Inguinal hernia    right   Psychiatric illness    Schizophrenia ([place])    No past surgical history on file.  Family History:  Family History  Problem Relation Age of Onset   Psychiatric Illness Mother    Hypertension Sister     Social History:  Social History   Socioeconomic History   Marital status: Single    Spouse name: Not on file   Number of children: Not on file   Years of education: 10   Highest education level: Not on file  Occupational History   Not on file  Tobacco Use   Smoking status: Every Day    Packs/day: 0.50    Years: 5.00    Total pack years: 2.50    Types: Cigarettes  Smokeless tobacco: Never  Vaping Use   Vaping Use: Never used  Substance and Sexual Activity   Alcohol use: Not Currently   Drug use: Yes    Types: Marijuana, Cocaine, "Crack" cocaine   Sexual activity: Yes    Birth control/protection: None  Other Topics Concern   Not on file  Social History Narrative   ** Merged History Encounter **       ** Merged History Encounter **       ** Merged History Encounter **       Social Determinants of Health   Financial Resource Strain: Not on file  Food Insecurity: Not on file  Transportation Needs: Not on file  Physical Activity: Not on file  Stress: Not on file  Social Connections: Not on file  Intimate Partner Violence: Not on file    SDOH:  SDOH Screenings   Alcohol Screen: Low Risk  (01/29/2022)  Depression (PHQ2-9): High Risk (09/19/2022)  Tobacco Use: High Risk (10/02/2022)    Last Labs:  Admission on 10/03/2022  Component Date Value Ref Range Status   SARSCOV2ONAVIRUS 2 AG 10/03/2022 NEGATIVE  NEGATIVE Final   Comment: (NOTE) SARS-CoV-2 antigen NOT DETECTED.   Negative results are presumptive.  Negative results do not preclude SARS-CoV-2 infection and should not be used as the sole  basis for treatment or other patient management decisions, including infection  control decisions, particularly in the presence of clinical signs and  symptoms consistent with COVID-19, or in those who have been in contact with the virus.  Negative results must be combined with clinical observations, patient history, and epidemiological information. The expected result is Negative.  Fact Sheet for Patients: HandmadeRecipes.com.cy  Fact Sheet for Healthcare Providers: FuneralLife.at  This test is not yet approved or cleared by the [place] FDA and  has been authorized for detection and/or diagnosis of SARS-CoV-2 by FDA under an Emergency Use Authorization (EUA).  This EUA will remain in effect (meaning this test can be used) for the duration of  the COV                          ID-19 declaration under Section 564(b)(1) of the Act, 21 U.S.C. section 360bbb-3(b)(1), unless the authorization is terminated or revoked sooner.    Admission on 10/02/2022, Discharged on 10/02/2022  Component Date Value Ref Range Status   SARS Coronavirus 2 by RT PCR 10/02/2022 NEGATIVE  NEGATIVE Final   Comment: (NOTE) SARS-CoV-2 target nucleic acids are NOT DETECTED.  The SARS-CoV-2 RNA is generally detectable in upper and lower respiratory specimens during the acute phase of infection. The lowest concentration of SARS-CoV-2 viral copies this assay can detect is 250 copies / mL. A negative result does not preclude SARS-CoV-2 infection and should not be used as the sole basis for treatment or other patient management decisions.  A negative result may occur with improper specimen collection / handling, submission of specimen other than nasopharyngeal swab, presence of viral mutation(s) within the areas targeted by this assay, and inadequate number of viral copies (<250 copies / mL). A negative result must be combined with clinical observations, patient  history, and epidemiological information.  Fact Sheet for Patients:   https://www.patel.info/  Fact Sheet for Healthcare Providers: https://hall.com/  This test is not yet approved or                           cleared by the  [place] Architectural technologist and has been authorized for detection and/or diagnosis of SARS-CoV-2 by FDA under an Print production planner (EUA).  This EUA will remain in effect (meaning this test can be used) for the duration of the COVID-19 declaration under Section 564(b)(1) of the Act, 21 U.S.C. section 360bbb-3(b)(1), unless the authorization is terminated or revoked sooner.  Performed at [facility], [place] [address], [place], [place] [address]   Admission on 09/19/2022, Discharged on 09/19/2022  Component Date Value Ref Range Status   Sodium 09/19/2022 137  135 - 145 mmol/L Final   Potassium 09/19/2022 3.5  3.5 - 5.1 mmol/L Final   Chloride 09/19/2022 103  98 - 111 mmol/L Final   CO2 09/19/2022 25  22 - 32 mmol/L Final   Glucose, Bld 09/19/2022 83  70 - 99 mg/dL Final   Glucose reference range applies only to samples taken after fasting for at least 8 hours.   BUN 09/19/2022 12  6 - 20 mg/dL Final   Creatinine, Ser 09/19/2022 0.96  0.61 - 1.24 mg/dL Final   Calcium 09/19/2022 9.6  8.9 - 10.3 mg/dL Final   Total Protein 09/19/2022 7.6  6.5 - 8.1 g/dL Final   Albumin 09/19/2022 3.7  3.5 - 5.0 g/dL Final   AST 09/19/2022 32  15 - 41 U/L Final   ALT 09/19/2022 29  0 - 44 U/L Final   Alkaline Phosphatase 09/19/2022 57  38 - 126 U/L Final   Total Bilirubin 09/19/2022 0.3  0.3 - 1.2 mg/dL Final   GFR, Estimated 09/19/2022 >60  >60 mL/min Final   Comment: (NOTE) Calculated using the CKD-EPI Creatinine Equation (2021)    Anion gap 09/19/2022 9  5 - 15 Final   Performed at [facility] [address] [place], [place] [address]   Alcohol, Ethyl (B) 09/19/2022 <10  <10 mg/dL Final   Comment: (NOTE) Lowest  detectable limit for serum alcohol is 10 mg/dL.  For medical purposes only. Performed at [facility], [place] [address], [place], [place] [address]    WBC 09/19/2022 10.4  4.0 - 10.5 K/uL Final   RBC 09/19/2022 4.55  4.22 - 5.81 MIL/uL Final   Hemoglobin 09/19/2022 12.6 (L)  13.0 - 17.0 g/dL Final   HCT 09/19/2022 38.3 (L)  39.0 - 52.0 % Final   MCV 09/19/2022 84.2  80.0 - 100.0 fL Final   MCH 09/19/2022 27.7  26.0 - 34.0 pg Final   MCHC 09/19/2022 32.9  30.0 - 36.0 g/dL Final   RDW 09/19/2022 13.6  11.5 - 15.5 % Final   Platelets 09/19/2022 420 (H)  150 - 400 K/uL Final   nRBC 09/19/2022 0.0  0.0 - 0.2 % Final   Performed at [facility], [place] [address] [place], [place] [address]   Opiates 09/19/2022 NONE DETECTED  NONE DETECTED Final   Cocaine 09/19/2022 POSITIVE (A)  NONE DETECTED Final   Benzodiazepines 09/19/2022 NONE DETECTED  NONE DETECTED Final   Amphetamines 09/19/2022 NONE DETECTED  NONE DETECTED Final   Tetrahydrocannabinol 09/19/2022 POSITIVE (A)  NONE DETECTED Final   Barbiturates 09/19/2022 NONE DETECTED  NONE DETECTED Final   Comment: (NOTE) DRUG SCREEN FOR MEDICAL PURPOSES ONLY.  IF CONFIRMATION IS NEEDED FOR ANY PURPOSE, NOTIFY LAB WITHIN 5 DAYS.  LOWEST DETECTABLE LIMITS FOR URINE DRUG SCREEN Drug Class                     Cutoff (ng/mL) Amphetamine  and metabolites    1000 Barbiturate and metabolites    200 Benzodiazepine                 409 Tricyclics and metabolites     300 Opiates and metabolites        300 Cocaine and metabolites        300 THC                            50 Performed at [facility], [place] [address] [place], [place] [address]    Salicylate Lvl 47/82/9562 <7.0 (L)  7.0 - 30.0 mg/dL Final   Performed at [facility] [address], [place], [place] [address]   Acetaminophen (Tylenol), Serum 09/19/2022 <10 (L)  10 - 30 ug/mL Final   Comment: (NOTE) Therapeutic concentrations vary significantly. A range of 10-30  ug/mL  may be an effective concentration for many patients. However, some  are best treated at concentrations outside of this range. Acetaminophen concentrations >150 ug/mL at 4 hours after ingestion  and >50 ug/mL at 12 hours after ingestion are often associated with  toxic reactions.  Performed at [facility], [place] [address] [place], [place] [address]    SARS Coronavirus 2 by RT PCR 09/19/2022 NEGATIVE  NEGATIVE Final   Comment: (NOTE) SARS-CoV-2 target nucleic acids are NOT DETECTED.  The SARS-CoV-2 RNA is generally detectable in upper respiratory specimens during the acute phase of infection. The lowest concentration of SARS-CoV-2 viral copies this assay can detect is 138 copies/mL. A negative result does not preclude SARS-Cov-2 infection and should not be used as the sole basis for treatment or other patient management decisions. A negative result may occur with  improper specimen collection/handling, submission of specimen other than nasopharyngeal swab, presence of viral mutation(s) within the areas targeted by this assay, and inadequate number of viral copies(<138 copies/mL). A negative result must be combined with clinical observations, patient history, and epidemiological information. The expected result is Negative.  Fact Sheet for Patients:  EntrepreneurPulse.com.au  Fact Sheet for Healthcare Providers:  IncredibleEmployment.be  This test is no                          t yet approved or cleared by the [place] FDA and  has been authorized for detection and/or diagnosis of SARS-CoV-2 by FDA under an Emergency Use Authorization (EUA). This EUA will remain  in effect (meaning this test can be used) for the duration of the COVID-19 declaration under Section 564(b)(1) of the Act, 21 U.S.C.section 360bbb-3(b)(1), unless the authorization is terminated  or revoked sooner.       Influenza A by PCR 09/19/2022 NEGATIVE   NEGATIVE Final   Influenza B by PCR 09/19/2022 NEGATIVE  NEGATIVE Final   Comment: (NOTE) The Xpert Xpress SARS-CoV-2/FLU/RSV plus assay is intended as an aid in the diagnosis of influenza from Nasopharyngeal swab specimens and should not be used as a sole basis for treatment. Nasal washings and aspirates are unacceptable for Xpert Xpress SARS-CoV-2/FLU/RSV testing.  Fact Sheet for Patients: EntrepreneurPulse.com.au  Fact Sheet for Healthcare Providers: IncredibleEmployment.be  This test is not yet approved or cleared by the [place] FDA and has been authorized for detection and/or diagnosis of SARS-CoV-2 by FDA under an Emergency Use Authorization (EUA). This EUA will remain in effect (meaning this test can be used) for the duration of  the COVID-19 declaration under Section 564(b)(1) of the Act, 21 U.S.C. section 360bbb-3(b)(1), unless the authorization is terminated or revoked.  Performed at [facility], [place] [address], [place], [place] [address]   Admission on 09/11/2022, Discharged on 09/11/2022  Component Date Value Ref Range Status   T Pallidum Abs 09/09/2022 Reactive (A)  Non Reactive Final   Comment: (NOTE) Performed At: [facility] [place], [place] [id number] Rush Farmer MD NU:[id number]   Admission on 09/10/2022, Discharged on 09/10/2022  Component Date Value Ref Range Status   Sodium 09/10/2022 141  135 - 145 mmol/L Final   Potassium 09/10/2022 3.5  3.5 - 5.1 mmol/L Final   Chloride 09/10/2022 103  98 - 111 mmol/L Final   CO2 09/10/2022 18 (L)  22 - 32 mmol/L Final   Glucose, Bld 09/10/2022 239 (H)  70 - 99 mg/dL Final   Glucose reference range applies only to samples taken after fasting for at least 8 hours.   BUN 09/10/2022 14  6 - 20 mg/dL Final   Creatinine, Ser 09/10/2022 1.35 (H)  0.61 - 1.24 mg/dL Final   Calcium 09/10/2022 9.7  8.9 - 10.3 mg/dL Final   Total Protein 09/10/2022 8.1   6.5 - 8.1 g/dL Final   Albumin 09/10/2022 4.3  3.5 - 5.0 g/dL Final   AST 09/10/2022 42 (H)  15 - 41 U/L Final   ALT 09/10/2022 21  0 - 44 U/L Final   Alkaline Phosphatase 09/10/2022 60  38 - 126 U/L Final   Total Bilirubin 09/10/2022 0.3  0.3 - 1.2 mg/dL Final   GFR, Estimated 09/10/2022 >60  >60 mL/min Final   Comment: (NOTE) Calculated using the CKD-EPI Creatinine Equation (2021)    Anion gap 09/10/2022 20 (H)  5 - 15 Final   Performed at [facility] [address], [place], [place] [address]   WBC 09/10/2022 21.9 (H)  4.0 - 10.5 K/uL Final   RBC 09/10/2022 4.91  4.22 - 5.81 MIL/uL Final   Hemoglobin 09/10/2022 13.8  13.0 - 17.0 g/dL Final   HCT 09/10/2022 42.0  39.0 - 52.0 % Final   MCV 09/10/2022 85.5  80.0 - 100.0 fL Final   MCH 09/10/2022 28.1  26.0 - 34.0 pg Final   MCHC 09/10/2022 32.9  30.0 - 36.0 g/dL Final   RDW 09/10/2022 13.6  11.5 - 15.5 % Final   Platelets 09/10/2022 348  150 - 400 K/uL Final   REPEATED TO VERIFY   nRBC 09/10/2022 0.0  0.0 - 0.2 % Final   Neutrophils Relative % 09/10/2022 88  % Final   Neutro Abs 09/10/2022 19.1 (H)  1.7 - 7.7 K/uL Final   Lymphocytes Relative 09/10/2022 6  % Final   Lymphs Abs 09/10/2022 1.4  0.7 - 4.0 K/uL Final   Monocytes Relative 09/10/2022 5  % Final   Monocytes Absolute 09/10/2022 1.2 (H)  0.1 - 1.0 K/uL Final   Eosinophils Relative 09/10/2022 0  % Final   Eosinophils Absolute 09/10/2022 0.0  0.0 - 0.5 K/uL Final   Basophils Relative 09/10/2022 0  % Final   Basophils Absolute 09/10/2022 0.1  0.0 - 0.1 K/uL Final   Immature Granulocytes 09/10/2022 1  % Final   Abs Immature Granulocytes 09/10/2022 0.16 (H)  0.00 - 0.07 K/uL Final   Performed at [facility], [place] [address], [place], [place] [address]  Admission on 09/09/2022, Discharged on 09/09/2022  Component Date Value Ref Range  Status   RPR Ser Ql 09/09/2022 Reactive (A)  NON REACTIVE Final   SENT FOR CONFIRMATION   RPR Titer 09/09/2022 1:32   Final    Performed at [facility], [place] [address], [place], [place] [address]   Color, Urine 09/09/2022 YELLOW  YELLOW Final   APPearance 09/09/2022 HAZY (A)  CLEAR Final   Specific Gravity, Urine 09/09/2022 1.025  1.005 - 1.030 Final   pH 09/09/2022 5.0  5.0 - 8.0 Final   Glucose, UA 09/09/2022 NEGATIVE  NEGATIVE mg/dL Final   Hgb urine dipstick 09/09/2022 NEGATIVE  NEGATIVE Final   Bilirubin Urine 09/09/2022 NEGATIVE  NEGATIVE Final   Ketones, ur 09/09/2022 NEGATIVE  NEGATIVE mg/dL Final   Protein, ur 09/09/2022 30 (A)  NEGATIVE mg/dL Final   Nitrite 09/09/2022 NEGATIVE  NEGATIVE Final   Leukocytes,Ua 09/09/2022 TRACE (A)  NEGATIVE Final   RBC / HPF 09/09/2022 0-5  0 - 5 RBC/hpf Final   WBC, UA 09/09/2022 21-50  0 - 5 WBC/hpf Final   Bacteria, UA 09/09/2022 RARE (A)  NONE SEEN Final   Mucus 09/09/2022 PRESENT   Final   Performed at [facility], [place] [address], [place], [place] [address]   HIV Screen 4th Generation wRfx 09/09/2022 Non Reactive  Non Reactive Final   Performed at [facility], [place] [address], [place], [place] [address]  Admission on 09/09/2022, Discharged on 09/09/2022  Component Date Value Ref Range Status   WBC 09/09/2022 9.1  4.0 - 10.5 K/uL Final   RBC 09/09/2022 4.27  4.22 - 5.81 MIL/uL Final   Hemoglobin 09/09/2022 12.1 (L)  13.0 - 17.0 g/dL Final   HCT 09/09/2022 34.9 (L)  39.0 - 52.0 % Final   MCV 09/09/2022 81.7  80.0 - 100.0 fL Final   MCH 09/09/2022 28.3  26.0 - 34.0 pg Final   MCHC 09/09/2022 34.7  30.0 - 36.0 g/dL Final   RDW 09/09/2022 13.2  11.5 - 15.5 % Final   Platelets 09/09/2022 347  150 - 400 K/uL Final   nRBC 09/09/2022 0.0  0.0 - 0.2 % Final   Neutrophils Relative % 09/09/2022 67  % Final   Neutro Abs 09/09/2022 6.1  1.7 - 7.7 K/uL Final   Lymphocytes Relative 09/09/2022 22  % Final   Lymphs Abs 09/09/2022 2.0  0.7 - 4.0 K/uL Final   Monocytes Relative 09/09/2022 8  % Final   Monocytes Absolute 09/09/2022 0.7  0.1 - 1.0 K/uL Final    Eosinophils Relative 09/09/2022 2  % Final   Eosinophils Absolute 09/09/2022 0.2  0.0 - 0.5 K/uL Final   Basophils Relative 09/09/2022 1  % Final   Basophils Absolute 09/09/2022 0.1  0.0 - 0.1 K/uL Final   Immature Granulocytes 09/09/2022 0  % Final   Abs Immature Granulocytes 09/09/2022 0.03  0.00 - 0.07 K/uL Final   Performed at [facility], [place] [address] [place], [place] [address]   Sodium 09/09/2022 135  135 - 145 mmol/L Final   Potassium 09/09/2022 3.2 (L)  3.5 - 5.1 mmol/L Final   Chloride 09/09/2022 104  98 - 111 mmol/L Final   CO2 09/09/2022 22  22 - 32 mmol/L Final   Glucose, Bld 09/09/2022 130 (H)  70 - 99 mg/dL Final   Glucose reference range applies only to samples taken after fasting for at least 8 hours.   BUN 09/09/2022 13  6 - 20 mg/dL Final   Creatinine, Ser 09/09/2022 1.01  0.61 - 1.24 mg/dL Final   Calcium 09/09/2022 9.3  8.9 - 10.3 mg/dL Final   Total Protein 09/09/2022 6.9  6.5 - 8.1 g/dL Final   Albumin 09/09/2022 3.4 (L)  3.5 - 5.0 g/dL Final   AST 09/09/2022 38  15 - 41 U/L Final   ALT 09/09/2022 21  0 - 44 U/L Final   Alkaline Phosphatase 09/09/2022 51  38 - 126 U/L Final   Total Bilirubin 09/09/2022 0.7  0.3 - 1.2 mg/dL Final   GFR, Estimated 09/09/2022 >60  >60 mL/min Final   Comment: (NOTE) Calculated using the CKD-EPI Creatinine Equation (2021)    Anion gap 09/09/2022 9  5 - 15 Final   Performed at [facility] [address] [place], [place] [address]   ABO/RH(D) 09/09/2022 O POS   Final   Antibody Screen 09/09/2022 NEG   Final   Sample Expiration 09/09/2022    Final                   Value:09/12/2022,2359 Performed at [facility], [place] [address], [place], [place] [address]   Admission on 09/02/2022, Discharged on 09/03/2022  Component Date Value Ref Range Status   Sodium 09/02/2022 140  135 - 145 mmol/L Final   Potassium 09/02/2022 3.7  3.5 - 5.1 mmol/L Final   Chloride 09/02/2022 107  98 - 111 mmol/L Final   CO2  09/02/2022 25  22 - 32 mmol/L Final   Glucose, Bld 09/02/2022 112 (H)  70 - 99 mg/dL Final   Glucose reference range applies only to samples taken after fasting for at least 8 hours.   BUN 09/02/2022 14  6 - 20 mg/dL Final   Creatinine, Ser 09/02/2022 0.92  0.61 - 1.24 mg/dL Final   Calcium 09/02/2022 9.3  8.9 - 10.3 mg/dL Final   Total Protein 09/02/2022 7.1  6.5 - 8.1 g/dL Final   Albumin 09/02/2022 3.5  3.5 - 5.0 g/dL Final   AST 09/02/2022 23  15 - 41 U/L Final   ALT 09/02/2022 15  0 - 44 U/L Final   Alkaline Phosphatase 09/02/2022 55  38 - 126 U/L Final   Total Bilirubin 09/02/2022 0.6  0.3 - 1.2 mg/dL Final   GFR, Estimated 09/02/2022 >60  >60 mL/min Final   Comment: (NOTE) Calculated using the CKD-EPI Creatinine Equation (2021)    Anion gap 09/02/2022 8  5 - 15 Final   Performed at [facility], [place] [address] [place], [place] [address]   Alcohol, Ethyl (B) 09/02/2022 <10  <10 mg/dL Final   Comment: (NOTE) Lowest detectable limit for serum alcohol is 10 mg/dL.  For medical purposes only. Performed at [facility], [place] [address], [place], [place] [address]    Salicylate Lvl 98/92/1194 <7.0 (L)  7.0 - 30.0 mg/dL Final   Performed at [facility] [address] [place], [place] [id number]   Acetaminophen (Tylenol), Serum 09/02/2022 <10 (L)  10 - 30 ug/mL Final   Comment: (NOTE) Therapeutic concentrations vary significantly. A range of 10-30 ug/mL  may be an effective concentration for many patients. However, some  are best treated at concentrations outside of this range. Acetaminophen concentrations >150 ug/mL at 4 hours after ingestion  and >50 ug/mL at 12 hours after ingestion are often associated with  toxic reactions.  Performed at [facility], [place] [address], [place], [place] [address]    WBC 09/02/2022 7.4  4.0 -  10.5 K/uL Final   RBC 09/02/2022 4.29  4.22 - 5.81 MIL/uL Final    Hemoglobin 09/02/2022 12.2 (L)  13.0 - 17.0 g/dL Final   HCT 09/02/2022 36.1 (L)  39.0 - 52.0 % Final   MCV 09/02/2022 84.1  80.0 - 100.0 fL Final   MCH 09/02/2022 28.4  26.0 - 34.0 pg Final   MCHC 09/02/2022 33.8  30.0 - 36.0 g/dL Final   RDW 09/02/2022 13.7  11.5 - 15.5 % Final   Platelets 09/02/2022 351  150 - 400 K/uL Final   nRBC 09/02/2022 0.0  0.0 - 0.2 % Final   Performed at [facility], [place] [address], [place], [place] [address]   Opiates 09/02/2022 NONE DETECTED  NONE DETECTED Final   Cocaine 09/02/2022 POSITIVE (A)  NONE DETECTED Final   Benzodiazepines 09/02/2022 POSITIVE (A)  NONE DETECTED Final   Amphetamines 09/02/2022 NONE DETECTED  NONE DETECTED Final   Tetrahydrocannabinol 09/02/2022 POSITIVE (A)  NONE DETECTED Final   Barbiturates 09/02/2022 NONE DETECTED  NONE DETECTED Final   Comment: (NOTE) DRUG SCREEN FOR MEDICAL PURPOSES ONLY.  IF CONFIRMATION IS NEEDED FOR ANY PURPOSE, NOTIFY LAB WITHIN 5 DAYS.  LOWEST DETECTABLE LIMITS FOR URINE DRUG SCREEN Drug Class                     Cutoff (ng/mL) Amphetamine and metabolites    1000 Barbiturate and metabolites    200 Benzodiazepine                 027 Tricyclics and metabolites     300 Opiates and metabolites        300 Cocaine and metabolites        300 THC                            50 Performed at [facility], [place] [address] [place], [place] [address]    SARS Coronavirus 2 by RT PCR 09/02/2022 NEGATIVE  NEGATIVE Final   Comment: (NOTE) SARS-CoV-2 target nucleic acids are NOT DETECTED.  The SARS-CoV-2 RNA is generally detectable in upper respiratory specimens during the acute phase of infection. The lowest concentration of SARS-CoV-2 viral copies this assay can detect is 138 copies/mL. A negative result does not preclude SARS-Cov-2 infection and should not be used as the sole basis for treatment or other patient management decisions. A negative result may occur with   improper specimen collection/handling, submission of specimen other than nasopharyngeal swab, presence of viral mutation(s) within the areas targeted by this assay, and inadequate number of viral copies(<138 copies/mL). A negative result must be combined with clinical observations, patient history, and epidemiological information. The expected result is Negative.  Fact Sheet for Patients:  EntrepreneurPulse.com.au  Fact Sheet for Healthcare Providers:  IncredibleEmployment.be  This test is no                          t yet approved or cleared by the [place] FDA and  has been authorized for detection and/or diagnosis of SARS-CoV-2 by FDA under an Emergency Use Authorization (EUA). This EUA will remain  in effect (meaning this test can be used) for the duration of the COVID-19 declaration under Section 564(b)(1) of the Act, 21 U.S.C.section 360bbb-3(b)(1), unless the authorization is terminated  or revoked sooner.       Influenza A by PCR 09/02/2022 NEGATIVE  NEGATIVE  Final   Influenza B by PCR 09/02/2022 NEGATIVE  NEGATIVE Final   Comment: (NOTE) The Xpert Xpress SARS-CoV-2/FLU/RSV plus assay is intended as an aid in the diagnosis of influenza from Nasopharyngeal swab specimens and should not be used as a sole basis for treatment. Nasal washings and aspirates are unacceptable for Xpert Xpress SARS-CoV-2/FLU/RSV testing.  Fact Sheet for Patients: EntrepreneurPulse.com.au  Fact Sheet for Healthcare Providers: IncredibleEmployment.be  This test is not yet approved or cleared by the [place] FDA and has been authorized for detection and/or diagnosis of SARS-CoV-2 by FDA under an Emergency Use Authorization (EUA). This EUA will remain in effect (meaning this test can be used) for the duration of the COVID-19 declaration under Section 564(b)(1) of the Act, 21 U.S.C. section 360bbb-3(b)(1), unless  the authorization is terminated or revoked.  Performed at [facility], [place] [address], [place], [place] [address]   Admission on 08/22/2022, Discharged on 08/22/2022  Component Date Value Ref Range Status   Sodium 08/22/2022 139  135 - 145 mmol/L Final   Potassium 08/22/2022 4.0  3.5 - 5.1 mmol/L Final   Chloride 08/22/2022 105  98 - 111 mmol/L Final   BUN 08/22/2022 13  6 - 20 mg/dL Final   Creatinine, Ser 08/22/2022 1.10  0.61 - 1.24 mg/dL Final   Glucose, Bld 08/22/2022 94  70 - 99 mg/dL Final   Glucose reference range applies only to samples taken after fasting for at least 8 hours.   Calcium, Ion 08/22/2022 0.99 (L)  1.15 - 1.40 mmol/L Final   TCO2 08/22/2022 26  22 - 32 mmol/L Final   Hemoglobin 08/22/2022 12.2 (L)  13.0 - 17.0 g/dL Final   HCT 08/22/2022 36.0 (L)  39.0 - 52.0 % Final   Alcohol, Ethyl (B) 08/22/2022 204 (H)  <10 mg/dL Final   Comment: (NOTE) Lowest detectable limit for serum alcohol is 10 mg/dL.  For medical purposes only. Performed at [facility], [place] [address], [place], [place] [address]    Opiates 08/22/2022 NONE DETECTED  NONE DETECTED Final   Cocaine 08/22/2022 POSITIVE (A)  NONE DETECTED Final   Benzodiazepines 08/22/2022 NONE DETECTED  NONE DETECTED Final   Amphetamines 08/22/2022 NONE DETECTED  NONE DETECTED Final   Tetrahydrocannabinol 08/22/2022 NONE DETECTED  NONE DETECTED Final   Barbiturates 08/22/2022 NONE DETECTED  NONE DETECTED Final   Comment: (NOTE) DRUG SCREEN FOR MEDICAL PURPOSES ONLY.  IF CONFIRMATION IS NEEDED FOR ANY PURPOSE, NOTIFY LAB WITHIN 5 DAYS.  LOWEST DETECTABLE LIMITS FOR URINE DRUG SCREEN Drug Class                     Cutoff (ng/mL) Amphetamine and metabolites    1000 Barbiturate and metabolites    200 Benzodiazepine                 831 Tricyclics and metabolites     300 Opiates and metabolites        300 Cocaine and metabolites        300 THC                            50 Performed  at [facility], [place] [address] [place], [place] [address]   Admission on 08/19/2022, Discharged on 08/19/2022  Component Date Value Ref Range Status   WBC 08/19/2022 9.6  4.0 - 10.5 K/uL Final   RBC 08/19/2022 4.31  4.22 -  5.81 MIL/uL Final   Hemoglobin 08/19/2022 12.1 (L)  13.0 - 17.0 g/dL Final   HCT 08/19/2022 36.2 (L)  39.0 - 52.0 % Final   MCV 08/19/2022 84.0  80.0 - 100.0 fL Final   MCH 08/19/2022 28.1  26.0 - 34.0 pg Final   MCHC 08/19/2022 33.4  30.0 - 36.0 g/dL Final   RDW 08/19/2022 13.4  11.5 - 15.5 % Final   Platelets 08/19/2022 277  150 - 400 K/uL Final   nRBC 08/19/2022 0.0  0.0 - 0.2 % Final   Neutrophils Relative % 08/19/2022 65  % Final   Neutro Abs 08/19/2022 6.2  1.7 - 7.7 K/uL Final   Lymphocytes Relative 08/19/2022 20  % Final   Lymphs Abs 08/19/2022 1.9  0.7 - 4.0 K/uL Final   Monocytes Relative 08/19/2022 11  % Final   Monocytes Absolute 08/19/2022 1.1 (H)  0.1 - 1.0 K/uL Final   Eosinophils Relative 08/19/2022 3  % Final   Eosinophils Absolute 08/19/2022 0.3  0.0 - 0.5 K/uL Final   Basophils Relative 08/19/2022 0  % Final   Basophils Absolute 08/19/2022 0.0  0.0 - 0.1 K/uL Final   Immature Granulocytes 08/19/2022 1  % Final   Abs Immature Granulocytes 08/19/2022 0.05  0.00 - 0.07 K/uL Final   Performed at [facility], [place] [address] [place], [place] [address]   Sodium 08/19/2022 141  135 - 145 mmol/L Final   Potassium 08/19/2022 3.8  3.5 - 5.1 mmol/L Final   Chloride 08/19/2022 108  98 - 111 mmol/L Final   CO2 08/19/2022 25  22 - 32 mmol/L Final   Glucose, Bld 08/19/2022 107 (H)  70 - 99 mg/dL Final   Glucose reference range applies only to samples taken after fasting for at least 8 hours.   BUN 08/19/2022 16  6 - 20 mg/dL Final   Creatinine, Ser 08/19/2022 0.93  0.61 - 1.24 mg/dL Final   Calcium 08/19/2022 8.9  8.9 - 10.3 mg/dL Final   Total Protein 08/19/2022 6.5  6.5 - 8.1 g/dL Final   Albumin 08/19/2022 3.4 (L)  3.5 - 5.0 g/dL Final    AST 08/19/2022 41  15 - 41 U/L Final   ALT 08/19/2022 36  0 - 44 U/L Final   Alkaline Phosphatase 08/19/2022 55  38 - 126 U/L Final   Total Bilirubin 08/19/2022 0.3  0.3 - 1.2 mg/dL Final   GFR, Estimated 08/19/2022 >60  >60 mL/min Final   Comment: (NOTE) Calculated using the CKD-EPI Creatinine Equation (2021)    Anion gap 08/19/2022 8  5 - 15 Final   Performed at [facility], [place] [address] [place], [place] [address]   Alcohol, Ethyl (B) 08/19/2022 <10  <10 mg/dL Final   Comment: (NOTE) Lowest detectable limit for serum alcohol is 10 mg/dL.  For medical purposes only. Performed at [facility], [place] [address], [place], [place] [address]    Opiates 08/19/2022 NONE DETECTED  NONE DETECTED Final   Cocaine 08/19/2022 POSITIVE (A)  NONE DETECTED Final   Benzodiazepines 08/19/2022 NONE DETECTED  NONE DETECTED Final   Amphetamines 08/19/2022 NONE DETECTED  NONE DETECTED Final   Tetrahydrocannabinol 08/19/2022 POSITIVE (A)  NONE DETECTED Final   Barbiturates 08/19/2022 NONE DETECTED  NONE DETECTED Final   Comment: (NOTE) DRUG SCREEN FOR MEDICAL PURPOSES ONLY.  IF CONFIRMATION IS NEEDED FOR ANY PURPOSE, NOTIFY LAB WITHIN 5 DAYS.  LOWEST DETECTABLE LIMITS FOR URINE DRUG SCREEN Drug Class  Cutoff (ng/mL) Amphetamine and metabolites    1000 Barbiturate and metabolites    200 Benzodiazepine                 197 Tricyclics and metabolites     300 Opiates and metabolites        300 Cocaine and metabolites        300 THC                            50 Performed at [facility], [place] [address] [place], [place] [address]   There may be more visits with results that are not included.    Allergies: Patient has no known allergies.  PTA Medications: (Not in a hospital admission)   Medical Decision Making  Inpatient observation  Meds ordered this encounter  Medications   acetaminophen (TYLENOL) tablet 650 mg   alum & mag hydroxide-simeth  (MAALOX/MYLANTA) 200-200-20 MG/5ML suspension 30 mL   magnesium hydroxide (MILK OF MAGNESIA) suspension 30 mL    Lab Orders         Resp Panel by RT-PCR (Flu A&B, Covid) Anterior Nasal Swab         CBC with Differential/Platelet         Comprehensive metabolic panel         TSH         POCT Urine Drug Screen - (I-Screen)         POC SARS Coronavirus 2 Ag        Recommendations  Based on my evaluation the patient does not appear to have an emergency medical condition.  Evette Georges, NP 10/03/22  5:51 AM

## 2022-10-03 NOTE — ED Notes (Signed)
Patient is discharging at this time. Patient is A&Ox4. stable. Patient denies SI,HI, and A/V/H with no plan/intent at this time. Printed AVS reviewed with and given to patient along with resources. Patient verbalized all understanding. Patient compliant with Invega injection. No side effects reported. All valuables/belongings returned to patient. Patient provided with bus pass and denies any pain/discomfort. No s/s of current distress.

## 2022-10-03 NOTE — ED Notes (Signed)
Pt A&O x 4, presents with suicidal ideation, no plan noted.  Pt is homeless with poor hygiene noted.  Shower offered.  Monitoring for safety.  Comfort measures given.

## 2022-10-03 NOTE — Discharge Instructions (Addendum)
Therapy Walk-in Hours  Monday-Wednesday: 8 AM until slots are full  Friday: 1 PM to 5 PM  For Monday to Wednesday, it is recommended that patients arrive between 7:30 AM and 7:45 AM because patients will be seen in the order of arrival.  For Friday, we ask that patients arrive between 12 PM to 12:30 PM.   Go to the second floor on arrival and check in.  Availability is limited; therefore, patients may not be seen on the same day.  Medication management walk-ins: Monday to Friday: 8 AM to 11 AM.  It is recommended that patients arrive by 7:30 AM to 7:45 AM because patients will be seen in the order of arrival.  Go to the second floor on arrival and check in.  Availability is limited; therefore, patients may not be seen on the same day.    [facility]: [place], [place], [place] - [phone]  [employer]: [address] [place], [place], [place] - [phone] or [phone] [employer] [facility] ([place]): [place] [address] [place], [place] - [phone] [facility]: [place] [address] [place], [place] -  [phone] [facility]: [address] [place], [place] -  [phone]

## 2022-10-03 NOTE — Progress Notes (Signed)
Pt said he walked to [facility] due to suicidal ideations.  He said it has something to do with "being schizoaffective."  He has no current plan on how he wants to kill himself.  Pt says he has had two previous attempts.  Patient says he has been hearing voices but that "I am used to them."  Pt has been "seeing dark circles."  He denies current HI.   Pt reports appetite has been okay but he has been not having access to food.  Patient says "I have been sleeping in the emergency room.:"  Pt denies having cocaine lately.  Patient said he needed to shower.  NP Evette Georges said that there was some evidence that patient had soiled himself.  Pt is urgent.

## 2022-10-09 ENCOUNTER — Emergency Department (HOSPITAL_COMMUNITY)
Admission: EM | Admit: 2022-10-09 | Discharge: 2022-10-09 | Disposition: A | Discharge status: Home / Self Care | Payer: Medicaid Other | Attending: Emergency Medicine | Admitting: Emergency Medicine

## 2022-10-09 ENCOUNTER — Other Ambulatory Visit: Payer: Self-pay

## 2022-10-09 DIAGNOSIS — R45851 Suicidal ideations: Secondary | ICD-10-CM | POA: Insufficient documentation

## 2022-10-09 DIAGNOSIS — F1910 Other psychoactive substance abuse, uncomplicated: Secondary | ICD-10-CM

## 2022-10-09 DIAGNOSIS — Z5900 Homelessness unspecified: Secondary | ICD-10-CM | POA: Diagnosis not present

## 2022-10-09 DIAGNOSIS — F250 Schizoaffective disorder, bipolar type: Secondary | ICD-10-CM | POA: Diagnosis present

## 2022-10-09 DIAGNOSIS — R443 Hallucinations, unspecified: Secondary | ICD-10-CM

## 2022-10-09 LAB — ETHANOL: Alcohol, Ethyl (B): <10 mg/dL (ref <10)

## 2022-10-09 LAB — COMPREHENSIVE METABOLIC PANEL
ALT: 25 U/L (ref 0–44)
AST: 36 U/L (ref 15–41)
Albumin: 3.6 g/dL (ref 3.5–5.0)
Alkaline Phosphatase: 53 U/L (ref 38–126)
Anion gap: 7 (ref 5–15)
BUN: 18 mg/dL (ref 6–20)
CO2: 24 mmol/L (ref 22–32)
Calcium: 8.9 mg/dL (ref 8.9–10.3)
Chloride: 104 mmol/L (ref 98–111)
Creatinine, Ser: 0.92 mg/dL (ref 0.61–1.24)
GFR, Estimated: >60 mL/min (ref >60)
Glucose, Bld: 112 mg/dL — ABNORMAL HIGH (ref 70–99)
Potassium: 3.6 mmol/L (ref 3.5–5.1)
Sodium: 135 mmol/L (ref 135–145)
Total Bilirubin: 0.6 mg/dL (ref 0.3–1.2)
Total Protein: 6.9 g/dL (ref 6.5–8.1)

## 2022-10-09 LAB — CBC
HCT: 37.9 % — ABNORMAL LOW (ref 39.0–52.0)
Hemoglobin: 13.2 g/dL (ref 13.0–17.0)
MCH: 28.1 pg (ref 26.0–34.0)
MCHC: 34.8 g/dL (ref 30.0–36.0)
MCV: 80.8 fL (ref 80.0–100.0)
Platelets: 362 10*3/uL (ref 150–400)
RBC: 4.69 MIL/uL (ref 4.22–5.81)
RDW: 13.6 % (ref 11.5–15.5)
WBC: 7.2 10*3/uL (ref 4.0–10.5)
nRBC: 0 % (ref 0.0–0.2)

## 2022-10-09 LAB — RAPID URINE DRUG SCREEN, HOSP PERFORMED
Amphetamines: NOT DETECTED
Barbiturates: NOT DETECTED
Benzodiazepines: NOT DETECTED
Cocaine: POSITIVE — AB
Opiates: NOT DETECTED
Tetrahydrocannabinol: POSITIVE — AB

## 2022-10-09 LAB — ACETAMINOPHEN LEVEL: Acetaminophen (Tylenol), Serum: <10 ug/mL — ABNORMAL LOW (ref 10–30)

## 2022-10-09 LAB — SALICYLATE LEVEL: Salicylate Lvl: <7 mg/dL — ABNORMAL LOW (ref 7.0–30.0)

## 2022-10-09 NOTE — ED Provider Triage Note (Signed)
  Emergency Medicine Provider Triage Evaluation Note  MRN:  [id number]  Arrival date & time: 10/09/22    Medically screening exam initiated at 6:22 AM.   CC:   Suicidal   HPI:  Achillies Buehl is a 26 y.o. year-old male presents to the ED with chief complaint of SI and hallucinations.  States that the symptoms are worsening.  Hx of the same.   HX of schizophrenia.  History provided by patient. ROS:  -As included in HPI PE:   Vitals:   10/09/22 0558  BP: 133/80  Pulse: 78  Resp: 20  Temp: (!) 97.5 F (36.4 C)  SpO2: 100%    Non-toxic appearing No respiratory distress  MDM:   I've ordered psych hold orders placed in triage to expedite lab/diagnostic workup.  Patient was informed that the remainder of the evaluation will be completed by another provider, this initial triage assessment does not replace that evaluation, and the importance of remaining in the ED until their evaluation is complete.    Montine Circle, PA-C 10/09/22 [phone]

## 2022-10-09 NOTE — Discharge Instructions (Addendum)
Today you were seen in the emergency department.    In the emergency department you were seen by the psychiatric team and emergency department team.    At home, please continue to take your medications and refrain from any substance use.    Follow-up with your primary doctor in 2-3 days regarding your visit.  Go to the behavioral health urgent care tomorrow for resources on your substance use and psychiatric conditions.  Return immediately to the emergency department if you experience any of the following: Thoughts of harming yourself, thoughts of harming others, hallucinations, or any other concerning symptoms.    Thank you for visiting our Emergency Department. It was a pleasure taking care of you today.

## 2022-10-09 NOTE — ED Provider Notes (Signed)
Per psych NP Ricky Ala patient is cleared by psychiatry.  Denied any SI/HI/AVH to their team.  Per off going emergency department notes it appears that the patient may have been motivated by hunger to be seen in the emergency department.  I reevaluated the patient and he is alert and oriented x3 and does still appear to be clinically sober.  He is denying any SI/HI to me.  No hallucinations here in the emergency department.  Informed him that he should follow-up with our behavioral health urgent care and return to the emergency department should he develop any thoughts of harming himself or other people or should he have any hallucinations.   Fransico Meadow, MD 10/09/22 [phone]

## 2022-10-09 NOTE — ED Provider Notes (Signed)
[facility] EMERGENCY DEPARTMENT Provider Note   CSN: [id number] Arrival date & time: 10/09/22  0543     History  Chief Complaint  Patient presents with   Suicidal    Elam Ellis is a 26 y.o. male presenting today for psychiatric concern.  Reports "for quite some time" ever since he was kicked out of his house and began living on the streets he has been experiencing some hallucinations.  Says that he hears voices that tell him to harm himself.  Says that he also visually hallucinates but he is unable to voice why.  Reports cocaine use but no alcohol use.  Has a history of schizophrenia, does not take any medications.  He says that the voices tell him to hurt himself by not eating.  He says that over the past few days he has stopped eating for this reason.  HPI     Home Medications Prior to Admission medications   Medication Sig Start Date End Date Taking? Authorizing Provider  paliperidone (INVEGA SUSTENNA) 234 MG/1.5ML SUSY injection Inject 234 mg into the muscle once for 1 dose. Patient taking differently: Inject 234 mg into the muscle every 30 (thirty) days. 04/16/22 10/03/22  Tharon Aquas, NP  gabapentin (NEURONTIN) 400 MG capsule Take 1 capsule (400 mg total) by mouth 3 (three) times daily. Patient not taking: Reported on 06/09/2021 04/30/21 06/10/21  Ethelene Hal, NP      Allergies    Patient has no known allergies.    Review of Systems   Review of Systems  Physical Exam Updated Vital Signs BP (!) 146/82 (BP Location: Right Arm)   Pulse 87   Temp 97.9 F (36.6 C) (Oral)   Resp 17   SpO2 99%  Physical Exam Vitals and nursing note reviewed.  Constitutional:      Appearance: Normal appearance.  HENT:     Head: Normocephalic and atraumatic.  Eyes:     General: No scleral icterus.    Conjunctiva/sclera: Conjunctivae normal.  Pulmonary:     Effort: Pulmonary effort is normal. No respiratory distress.  Skin:    Findings: No  rash.  Neurological:     Mental Status: He is alert.  Psychiatric:     Comments: Slightly withdrawn with an flat effect     ED Results / Procedures / Treatments   Labs (all labs ordered are listed, but only abnormal results are displayed) Labs Reviewed  COMPREHENSIVE METABOLIC PANEL - Abnormal; Notable for the following components:      Result Value   Glucose, Bld 112 (*)    All other components within normal limits  SALICYLATE LEVEL - Abnormal; Notable for the following components:   Salicylate Lvl <1.7 (*)    All other components within normal limits  ACETAMINOPHEN LEVEL - Abnormal; Notable for the following components:   Acetaminophen (Tylenol), Serum <10 (*)    All other components within normal limits  CBC - Abnormal; Notable for the following components:   HCT 37.9 (*)    All other components within normal limits  RESP PANEL BY RT-PCR (FLU A&B, COVID) [id number]  ETHANOL  RAPID URINE DRUG SCREEN, HOSP PERFORMED    EKG None  Radiology No results found.  Procedures Procedures   Medications Ordered in ED Medications - No data to display  ED Course/ Medical Decision Making/ A&P  Medical Decision Making Amount and/or Complexity of Data Reviewed Labs: ordered.   26 year old male presenting due to auditory and visual hallucinations.  Says that the voices are telling him to stop eating and that is the way he would end his life.  Do not see any indication for IVC at this time, I suspect patient's visit to be more secondary to homelessness as he mentions that this has been going on for a very long time ever since he was kicked out of his secure living situation.  Work-up is benign today.  UDS pending however patient is clinically sober and able to participate in TTS evaluation.  Final Clinical Impression(s) / ED Diagnoses Final diagnoses:  Hallucinations    Rx / DC Orders ED Discharge Orders     None      Medically cleared at this  time    Rhae Hammock, PA-C 60/10/93 2355    [place], [place], DO 73/22/02 1432

## 2022-10-09 NOTE — Consult Note (Signed)
[facility] Psych ED Discharge  10/09/2022 11:35 AM Tyler White  MRN:  [id number]  Principal Problem: Homelessness Discharge Diagnoses: Principal Problem:   Homelessness Active Problems:   Schizoaffective disorder, bipolar type ([place])  Clinical Impression:  Final diagnoses:  None   Subjective:  Tyler White 26 year old African-American male well-known to this service. Consults order was placed for "psych consult."  Per HPI;" patient presented with a chief complaint of SI and hallucinations."   Patient was seen and evaluated face-to-face reporting chronic auditory hallucinations.  Denied that he is compliant with medications currently.  He reports " I have a low appetite and  that is why I am feeling weird."  Patient requested another meal tray.  He is denying suicidal or homicidal ideations during this assessment.  As charted in previous assessment's Witting's "behaviors are more consistent with someone who is acting in self-preservation, rather than someone who is acutely suicidal.  Patient ongoing endorsement of suicidal ideation shows clear evidence of secondary gain of unmet needs that is representative of limited and often- maladaptive coping skills and rather than an indicator of imminent risk of death"  During evaluation Tyler White is resting  in no acute distress. He  is alert/oriented x 3; calm/cooperative; and mood congruent with affect.  he is speaking in a clear tone at moderate volume, and normal pace; with good eye contact.his thought process is coherent and relevant; There is no indication that he is currently responding to internal/external stimuli or experiencing delusional thought content; and he has denied suicidal/self-harm/homicidal ideation. He reported auditory hallucination  Patient has remained calm throughout assessment and has answered questions appropriately.     At this time Tyler White is educated and verbalizes understanding of mental health  resources and other crisis services in the community. He is instructed to call 911 and present to the nearest emergency room should he experience any suicidal/homicidal ideation, auditory/visual/hallucinations, or detrimental worsening of his  mental health condition.He was a also advised by Probation officer that he could call the toll-free phone on insurance card to assist with identifying in network counselors and agencies or number on back of Medicaid card to speak with care coordinator.    ED Assessment Time Calculation: Start Time: 1133 Stop Time: 1200 Total Time in Minutes (Assessment Completion): 27   Past Psychiatric History:   Past Medical History:  Past Medical History:  Diagnosis Date   Hernia, inguinal, right    Inguinal hernia    right   Psychiatric illness    Schizophrenia ([place])    No past surgical history on file. Family History:  Family History  Problem Relation Age of Onset   Psychiatric Illness Mother    Hypertension Sister    Family Psychiatric  History:  Social History:  Social History   Substance and Sexual Activity  Alcohol Use Not Currently     Social History   Substance and Sexual Activity  Drug Use Yes   Types: Marijuana, Cocaine, "Crack" cocaine    Social History   Socioeconomic History   Marital status: Single    Spouse name: Not on file   Number of children: Not on file   Years of education: 10   Highest education level: Not on file  Occupational History   Not on file  Tobacco Use   Smoking status: Every Day    Packs/day: 0.50    Years: 5.00    Total pack years: 2.50    Types: Cigarettes   Smokeless tobacco: Never  Vaping Use  Vaping Use: Never used  Substance and Sexual Activity   Alcohol use: Not Currently   Drug use: Yes    Types: Marijuana, Cocaine, "Crack" cocaine   Sexual activity: Yes    Birth control/protection: None  Other Topics Concern   Not on file  Social History Narrative   ** Merged History Encounter **       **  Merged History Encounter **       ** Merged History Encounter **       Social Determinants of Radio broadcast assistant Strain: Not on file  Food Insecurity: Not on file  Transportation Needs: Not on file  Physical Activity: Not on file  Stress: Not on file  Social Connections: Not on file    Tobacco Cessation:  N/A, patient does not currently use tobacco products  Current Medications: No current facility-administered medications for this encounter.   Current Outpatient Medications  Medication Sig Dispense Refill   paliperidone (INVEGA SUSTENNA) 234 MG/1.5ML SUSY injection Inject 234 mg into the muscle once for 1 dose. (Patient taking differently: Inject 234 mg into the muscle every 30 (thirty) days.) 1.5 mL 0   PTA Medications: (Not in a hospital admission)   [place]:  [place] ED from 10/09/2022 in [place] ED from 10/03/2022 in [facility] ED from 10/02/2022 in [place] CATEGORY High Risk High Risk No Risk       Musculoskeletal: Strength & Muscle Tone: within normal limits Gait & Station: normal Patient leans: N/A  Psychiatric Specialty Exam: Presentation  General Appearance:  Appropriate for Environment  Eye Contact: Fair  Speech: Clear and Coherent  Speech Volume: Normal  Handedness: Right   Mood and Affect  Mood: Anxious; Depressed  Affect: Congruent   Thought Process  Thought Processes: Coherent  Descriptions of Associations:Intact  Orientation:Full (Time, Place and Person)  Thought Content:Illogical  History of Schizophrenia/Schizoaffective disorder:Yes  Duration of Psychotic Symptoms:Greater than six months  Hallucinations:No data recorded Ideas of Reference:None  Suicidal Thoughts:No data recorded Homicidal Thoughts:No data recorded  Sensorium  Memory: Immediate  Fair  Judgment: Poor  Insight: Poor   Executive Functions  Concentration: Fair  Attention Span: Fair  Recall: [place] of Knowledge: Fair  Language: Fair   Psychomotor Activity  Psychomotor Activity:No data recorded  Assets  Assets: Communication Skills; Desire for Improvement   Sleep  Sleep:No data recorded   Physical Exam: Physical Exam Vitals and nursing note reviewed.  Cardiovascular:     Rate and Rhythm: Normal rate and regular rhythm.  Neurological:     Mental Status: He is oriented to person, place, and time.  Psychiatric:        Mood and Affect: Mood normal.        Behavior: Behavior normal.    Review of Systems  Cardiovascular: Negative.   Gastrointestinal: Negative.   Psychiatric/Behavioral:  Positive for depression and hallucinations. Negative for suicidal ideas.   All other systems reviewed and are negative.  Blood pressure (!) 146/82, pulse 87, temperature 97.9 F (36.6 C), temperature source Oral, resp. rate 17, SpO2 99 %. There is no height or weight on file to calculate BMI.   Demographic Factors:  Male  Loss Factors: NA  Historical Factors: NA  Risk Reduction Factors:   NA  Continued Clinical Symptoms:  Alcohol/Substance Abuse/Dependencies  Cognitive Features That Contribute To Risk:  Closed-mindedness    Suicide Risk:  Minimal:  No identifiable suicidal ideation.  Patients presenting with no risk factors but with morbid ruminations; may be classified as minimal risk based on the severity of the depressive symptoms    Plan Of Care/Follow-up recommendations:  Activity:  as tolerated Diet:  heart healthy    Disposition: Patient to be cleared by psychiatry  Derrill Center, NP 10/09/2022, 11:35 AM

## 2022-10-09 NOTE — ED Triage Notes (Signed)
Pt here w/ SI and hallucinations. Hx of schizophrenia.

## 2022-10-11 ENCOUNTER — Other Ambulatory Visit: Payer: Self-pay

## 2022-10-11 ENCOUNTER — Emergency Department (HOSPITAL_COMMUNITY)
Admission: EM | Admit: 2022-10-11 | Discharge: 2022-10-11 | Disposition: A | Discharge status: Home / Self Care | Payer: Medicaid Other | Attending: Emergency Medicine | Admitting: Emergency Medicine

## 2022-10-11 ENCOUNTER — Encounter (HOSPITAL_COMMUNITY): Payer: Self-pay | Admitting: Emergency Medicine

## 2022-10-11 DIAGNOSIS — J069 Acute upper respiratory infection, unspecified: Secondary | ICD-10-CM | POA: Diagnosis not present

## 2022-10-11 DIAGNOSIS — Z20822 Contact with and (suspected) exposure to covid-19: Secondary | ICD-10-CM | POA: Diagnosis not present

## 2022-10-11 DIAGNOSIS — R059 Cough, unspecified: Secondary | ICD-10-CM | POA: Diagnosis present

## 2022-10-11 LAB — RESP PANEL BY RT-PCR (FLU A&B, COVID) ARPGX2
Influenza A by PCR: NEGATIVE
Influenza B by PCR: NEGATIVE
SARS Coronavirus 2 by RT PCR: NEGATIVE

## 2022-10-11 MED ORDER — ACETAMINOPHEN 325 MG PO TABS
650.0000 mg | ORAL_TABLET | Freq: Once | ORAL | Status: AC
  Administered 2022-10-11: 650 mg via ORAL
  Filled 2022-10-11: qty 2

## 2022-10-11 NOTE — ED Provider Notes (Signed)
[facility] EMERGENCY DEPARTMENT Provider Note   CSN: [id number] Arrival date & time: 10/11/22  0140     History  Chief Complaint  Patient presents with   Cough    Cabot Cromartie is a 26 y.o. male.  Patient is a 26 year old male with a past medical history of schizophrenia, substance use and homelessness presenting to the emergency department with cough.  The patient states that last night he started to develop cough and congestion and felt feverish.  He states he did not measure his temperature.  Patient has no other complaints and is requesting a sandwich.  History is limited due to patient's participation in exam.  The history is provided by the patient.  Cough      Home Medications Prior to Admission medications   Medication Sig Start Date End Date Taking? Authorizing Provider  acetaminophen (TYLENOL) 500 MG tablet Take 1,000 mg by mouth every 6 (six) hours as needed for moderate pain.   Yes [provider]  paliperidone (INVEGA SUSTENNA) 234 MG/1.5ML SUSY injection Inject 234 mg into the muscle once for 1 dose. Patient taking differently: Inject 234 mg into the muscle every 30 (thirty) days. 04/16/22 10/11/22 Yes Tharon Aquas, NP  gabapentin (NEURONTIN) 400 MG capsule Take 1 capsule (400 mg total) by mouth 3 (three) times daily. Patient not taking: Reported on 06/09/2021 04/30/21 06/10/21  Ethelene Hal, NP      Allergies    Patient has no known allergies.    Review of Systems   Review of Systems  Respiratory:  Positive for cough.     Physical Exam Updated Vital Signs BP (!) 132/106 (BP Location: Left Arm)   Pulse 100   Temp 98.2 F (36.8 C) (Oral)   Resp 16   SpO2 100%  Physical Exam Vitals and nursing note reviewed.  Constitutional:      General: He is not in acute distress.    Appearance: He is not toxic-appearing.     Comments: Asleep, arousable to verbal stimuli and then falls back asleep during exam  HENT:      Head: Normocephalic and atraumatic.     Nose: Nose normal.     Mouth/Throat:     Mouth: Mucous membranes are moist.     Pharynx: Oropharynx is clear. No oropharyngeal exudate or posterior oropharyngeal erythema.  Eyes:     Extraocular Movements: Extraocular movements intact.     Conjunctiva/sclera: Conjunctivae normal.  Cardiovascular:     Rate and Rhythm: Normal rate and regular rhythm.     Pulses: Normal pulses.     Heart sounds: Normal heart sounds.  Pulmonary:     Effort: Pulmonary effort is normal.     Breath sounds: Normal breath sounds.  Abdominal:     General: Abdomen is flat.     Palpations: Abdomen is soft.     Tenderness: There is no abdominal tenderness.  Musculoskeletal:        General: Normal range of motion.     Cervical back: Normal range of motion and neck supple.     Right lower leg: No edema.     Left lower leg: No edema.  Skin:    General: Skin is warm and dry.  Neurological:     General: No focal deficit present.     Mental Status: He is oriented to person, place, and time.  Psychiatric:        Mood and Affect: Mood normal.  Behavior: Behavior normal.     ED Results / Procedures / Treatments   Labs (all labs ordered are listed, but only abnormal results are displayed) Labs Reviewed  RESP PANEL BY RT-PCR (FLU A&B, COVID) [id number]    EKG None  Radiology No results found.  Procedures Procedures    Medications Ordered in ED Medications  acetaminophen (TYLENOL) tablet 650 mg (has no administration in time range)    ED Course/ Medical Decision Making/ A&P                           Medical Decision Making This patient presents to the ED with chief complaint(s) of fever and cough with pertinent past medical history of homelessness, schizophrenia, substance use which further complicates the presenting complaint. The complaint involves an extensive differential diagnosis and also carries with it a high risk of complications and morbidity.     The differential diagnosis includes viral syndrome, pneumonia less likely as patient is afebrile here, satting well on room air without any focal lung sounds, patient with no evidence of any respiratory distress on exam, he has no evidence of tonsillitis or pharyngitis  Additional history obtained: Additional history obtained from N/A Records reviewed recent ED records  ED Course and Reassessment: Patient was initially evaluated by provider in triage and had COVID and flu test performed which were negative.  The patient reports feeling feverish to me and I repeated his temperature which was 37.6.  He was given Tylenol for his body aches.  He has no focal lung sounds and is afebrile here satting well on room air making pneumonia unlikely.  He is continuing to fall asleep on exam not wanting to participate requesting to just sleep.  He is stable for discharge with primary care follow-up and was given strict return precautions.  Independent labs interpretation:  The following labs were independently interpreted: COVID and flu negative  Independent visualization of imaging: N/A  Consultation: - Consulted or discussed management/test interpretation w/ external professional: N/A  Consideration for admission or further workup: Patient has no emergent conditions requiring admission at this time Social Determinants of health: Homelessness, substance use    Risk OTC drugs.          Final Clinical Impression(s) / ED Diagnoses Final diagnoses:  Viral URI with cough    Rx / DC Orders ED Discharge Orders     None         Kemper Durie, DO 10/11/22 [phone]

## 2022-10-11 NOTE — ED Triage Notes (Signed)
Patient reports cough with chest discomfort this evening , respirations unlabored , no fever or chills .

## 2022-10-11 NOTE — ED Provider Triage Note (Signed)
Emergency Medicine Provider Triage Evaluation Note  Finneas Mathe , a 26 y.o. male  was evaluated in triage.  Pt complains of cough and chest congestion.  No fever/chills.  Denies sick contacts.  Review of Systems  Positive: cough Negative: fever  Physical Exam  BP 137/84 (BP Location: Right Arm)   Pulse (!) 42   Temp 98.9 F (37.2 C) (Oral)   Resp 18   SpO2 98%  Gen:   Awake, no distress   Resp:  Normal effort, dry cough MSK:   Moves extremities without difficulty  Other:    Medical Decision Making  Medically screening exam initiated at 2:19 AM.  Appropriate orders placed.  Kevaughn Ewing was informed that the remainder of the evaluation will be completed by another provider, this initial triage assessment does not replace that evaluation, and the importance of remaining in the ED until their evaluation is complete.  Cough.  Afebrile, non-toxic.  Covid/flu screen sent.   Larene Pickett, PA-C 10/11/22 [phone]

## 2022-10-11 NOTE — Discharge Instructions (Signed)
You were seen in the emergency department for your cough.  Your COVID and flu test were negative.  You had no signs of pneumonia on your exam.  You should follow-up with your primary doctor in the next few days to have your symptoms rechecked.  You should return to the emergency department if you are having worsening shortness of breath, you pass out, or if you have any other new or concerning symptoms.

## 2022-10-12 ENCOUNTER — Other Ambulatory Visit: Payer: Self-pay

## 2022-10-12 ENCOUNTER — Encounter (HOSPITAL_COMMUNITY): Payer: Self-pay

## 2022-10-12 ENCOUNTER — Emergency Department (HOSPITAL_COMMUNITY)
Admission: EM | Admit: 2022-10-12 | Discharge: 2022-10-13 | Disposition: A | Discharge status: Home / Self Care | Payer: Medicaid Other | Attending: Emergency Medicine | Admitting: Emergency Medicine

## 2022-10-12 DIAGNOSIS — T50905A Adverse effect of unspecified drugs, medicaments and biological substances, initial encounter: Secondary | ICD-10-CM | POA: Diagnosis not present

## 2022-10-12 DIAGNOSIS — Z79899 Other long term (current) drug therapy: Secondary | ICD-10-CM | POA: Insufficient documentation

## 2022-10-12 DIAGNOSIS — R569 Unspecified convulsions: Secondary | ICD-10-CM | POA: Diagnosis not present

## 2022-10-12 LAB — CBC
HCT: 37.4 % — ABNORMAL LOW (ref 39.0–52.0)
Hemoglobin: 12.4 g/dL — ABNORMAL LOW (ref 13.0–17.0)
MCH: 27.7 pg (ref 26.0–34.0)
MCHC: 33.2 g/dL (ref 30.0–36.0)
MCV: 83.5 fL (ref 80.0–100.0)
Platelets: 319 10*3/uL (ref 150–400)
RBC: 4.48 MIL/uL (ref 4.22–5.81)
RDW: 13.3 % (ref 11.5–15.5)
WBC: 9.5 10*3/uL (ref 4.0–10.5)
nRBC: 0 % (ref 0.0–0.2)

## 2022-10-12 LAB — BASIC METABOLIC PANEL
Anion gap: 10 (ref 5–15)
BUN: 20 mg/dL (ref 6–20)
CO2: 22 mmol/L (ref 22–32)
Calcium: 9.3 mg/dL (ref 8.9–10.3)
Chloride: 104 mmol/L (ref 98–111)
Creatinine, Ser: 0.99 mg/dL (ref 0.61–1.24)
GFR, Estimated: >60 mL/min (ref >60)
Glucose, Bld: 105 mg/dL — ABNORMAL HIGH (ref 70–99)
Potassium: 4 mmol/L (ref 3.5–5.1)
Sodium: 136 mmol/L (ref 135–145)

## 2022-10-12 LAB — CBG MONITORING, ED: Glucose-Capillary: 115 mg/dL — ABNORMAL HIGH (ref 70–99)

## 2022-10-12 MED ORDER — LEVETIRACETAM IN NACL 1000 MG/100ML IV SOLN: 1000.0000 mg | Freq: Once | INTRAVENOUS | Status: DC

## 2022-10-12 MED ORDER — SODIUM CHLORIDE 0.9 % IV BOLUS
1000.0000 mL | Freq: Once | INTRAVENOUS | Status: AC
  Administered 2022-10-12: 1000 mL via INTRAVENOUS

## 2022-10-12 NOTE — ED Provider Notes (Signed)
[facility] EMERGENCY DEPARTMENT Provider Note   CSN: [id number] Arrival date & time: 10/12/22  2229     History  Chief Complaint  Patient presents with   Seizures    Tyler White is a 26 y.o. male.  The history is provided by the patient, the EMS personnel and medical records. No language interpreter was used.  Seizures    26 year old male significant history of schizophrenia, polysubstance abuse including marijuana abuse and cocaine abuse, malingering, homelessness, brought here via EMS for evaluation of seizure.  Per EMS, patient had a seizure witnessed by friend earlier today.  However EMS arrived, patient was standing with assistance and patient was brought here.  They did notice that patient had bit his tongue.  Patient did recall using marijuana with friends as well as cocaine which preceded his seizure episode.  He endorses mild tongue discomfort but otherwise denies any headache chest pain trouble breathing abdominal pain back pain focal numbness or focal weakness.  He mention having 1 similar seizure episode a few months prior without evaluation.  He mention having seizures when he was young but he is not on any antiseizure medication.  Was unable to see any prior neurology note confirming history of seizure.  Home Medications Prior to Admission medications   Medication Sig Start Date End Date Taking? Authorizing Provider  acetaminophen (TYLENOL) 500 MG tablet Take 1,000 mg by mouth every 6 (six) hours as needed for moderate pain.    [provider]  paliperidone (INVEGA SUSTENNA) 234 MG/1.5ML SUSY injection Inject 234 mg into the muscle once for 1 dose. Patient taking differently: Inject 234 mg into the muscle every 30 (thirty) days. 04/16/22 10/11/22  Tharon Aquas, NP  gabapentin (NEURONTIN) 400 MG capsule Take 1 capsule (400 mg total) by mouth 3 (three) times daily. Patient not taking: Reported on 06/09/2021 04/30/21 06/10/21  Ethelene Hal, NP      Allergies    Patient has no known allergies.    Review of Systems   Review of Systems  Neurological:  Positive for seizures.  All other systems reviewed and are negative.   Physical Exam Updated Vital Signs BP 124/74 (BP Location: Right Arm)   Pulse 94   Temp 98.3 F (36.8 C) (Oral)   Resp 14   Ht '5\' 8"'$  (1.727 m)   Wt 68 kg   SpO2 97%   BMI 22.81 kg/m  Physical Exam Vitals and nursing note reviewed.  Constitutional:      General: He is not in acute distress.    Appearance: He is well-developed.  HENT:     Head: Atraumatic.     Mouth/Throat:     Comments: Shallow laceration noted to the lateral aspect of his tongue on the right side. Eyes:     Conjunctiva/sclera: Conjunctivae normal.  Cardiovascular:     Rate and Rhythm: Normal rate and regular rhythm.     Pulses: Normal pulses.     Heart sounds: Normal heart sounds.  Pulmonary:     Effort: Pulmonary effort is normal.     Breath sounds: Normal breath sounds.  Abdominal:     Palpations: Abdomen is soft.     Tenderness: There is no abdominal tenderness.  Musculoskeletal:     Cervical back: Neck supple.  Skin:    Findings: No rash.  Neurological:     Mental Status: He is alert and oriented to person, place, and time.     GCS: GCS eye subscore is  4. GCS verbal subscore is 5. GCS motor subscore is 6.     Cranial Nerves: Cranial nerves 2-12 are intact.     Sensory: Sensation is intact.     Motor: Motor function is intact.     ED Results / Procedures / Treatments   Labs (all labs ordered are listed, but only abnormal results are displayed) Labs Reviewed  BASIC METABOLIC PANEL - Abnormal; Notable for the following components:      Result Value   Glucose, Bld 105 (*)    All other components within normal limits  CBC - Abnormal; Notable for the following components:   Hemoglobin 12.4 (*)    HCT 37.4 (*)    All other components within normal limits  CBG MONITORING, ED - Abnormal; Notable for  the following components:   Glucose-Capillary 115 (*)    All other components within normal limits  RAPID URINE DRUG SCREEN, HOSP PERFORMED  URINALYSIS, ROUTINE W REFLEX MICROSCOPIC    EKG None  Radiology No results found.  Procedures Procedures    Medications Ordered in ED Medications  sodium chloride 0.9 % bolus 1,000 mL (1,000 mLs Intravenous New Bag/Given 10/12/22 2340)    ED Course/ Medical Decision Making/ A&P                           Medical Decision Making  BP 124/74 (BP Location: Right Arm)   Pulse 94   Temp 98.3 F (36.8 C) (Oral)   Resp 14   Ht '5\' 8"'$  (1.727 m)   Wt 68 kg   SpO2 97%   BMI 22.81 kg/m   77:38 PM 26 year old male significant history of schizophrenia, polysubstance abuse including marijuana abuse and cocaine abuse, malingering, homelessness, brought here via EMS for evaluation of seizure.  Per EMS, patient had a seizure witnessed by friend earlier today.  However EMS arrived, patient was standing with assistance and patient was brought here.  They did notice that patient had bit his tongue.  Patient did recall using marijuana with friends as well as cocaine which preceded his seizure episode.  He endorses mild tongue discomfort but otherwise denies any headache chest pain trouble breathing abdominal pain back pain focal numbness or focal weakness.  He mention having 1 similar seizure episode a few months prior without evaluation.  He mention having seizures when he was young but he is not on any antiseizure medication.  Was unable to see any prior neurology note confirming history of seizure.  11:06 PM I appreciate consultation from on-call neurologist Dr. Carolin Guernsey who felt that patient's seizure activity is likely secondary to polysubstance use including cocaine use as patient has numerous positive cocaine in his UDS in the past and patient did admit to using cocaine.  He does not recommend starting antiseizure medication.  I strongly encourage  patient to avoid substance use.  11:34 PM Labs obtained independently viewed interpreted by me.  Electrolyte panels are reassuring.  At this time patient is back to his baseline and is stable to be discharged.  This patient presents to the ED for concern of seizure, this involves an extensive number of treatment options, and is a complaint that carries with it a high risk of complications and morbidity.  The differential diagnosis includes seizure, syncope, cardiac arrhythmia, anemia, electrolytes imbalance.   Co morbidities that complicate the patient evaluation polysubstance use  Seizure  malingering   Additional history obtained:  Additional history obtained from EMS External  records from outside source obtained and reviewed including EMR  Lab Tests:  I Ordered, and personally interpreted labs.  The pertinent results include:  as above   Cardiac Monitoring:  The patient was maintained on a cardiac monitor.  I personally viewed and interpreted the cardiac monitored which showed an underlying rhythm of: NSR  Medicines ordered and prescription drug management:  I ordered medication including IVF  for seizure Reevaluation of the patient after these medicines showed that the patient improved I have reviewed the patients home medicines and have made adjustments as needed  Test Considered: as above  Critical Interventions: IVF  Consultations Obtained:  I requested consultation with the neurologist,  and discussed lab and imaging findings as well as pertinent plan - they recommend: no antiseizure medication.  Drug cessagion  Problem List / ED Course: seizure  Reevaluation:  After the interventions noted above, I reevaluated the patient and found that they have :improved  Social Determinants of Health: polysubstance use  homelessness  Dispostion:  After consideration of the diagnostic results and the patients response to treatment, I feel that the patent would benefit  from outpt f/u.         Final Clinical Impression(s) / ED Diagnoses Final diagnoses:  Drug-induced seizure [facility])    Rx / DC Orders ED Discharge Orders     None         [name], PA-C 10/12/22 2351    Sherwood Gambler, MD 10/15/22 [phone]

## 2022-10-12 NOTE — ED Triage Notes (Signed)
Pt to ED via [facility]. Pt had a seizure that was witnessed by friend. Pt was standing with assistance when EMS arrived. Pt did not fall, did bite his tounge and bottom lip. Pt is A&Ox4 on arrival, denies any complaints. Pt states he has a hx of seizures, last one was in March, does not take any medications for same.  EMS VS Cbg=124 HR=115 105/78

## 2022-10-12 NOTE — Discharge Instructions (Signed)
Please avoid cocaine use as it may trigger seizures.  Take medication regularly as prescribed.  Follow-up with your doctor for further care.

## 2022-10-14 ENCOUNTER — Ambulatory Visit (HOSPITAL_COMMUNITY)
Admission: EM | Admit: 2022-10-14 | Discharge: 2022-10-14 | Disposition: A | Discharge status: Home / Self Care | Payer: Medicaid Other | Attending: Psychiatry | Admitting: Psychiatry

## 2022-10-14 DIAGNOSIS — F250 Schizoaffective disorder, bipolar type: Secondary | ICD-10-CM | POA: Insufficient documentation

## 2022-10-14 DIAGNOSIS — F1410 Cocaine abuse, uncomplicated: Secondary | ICD-10-CM | POA: Insufficient documentation

## 2022-10-14 DIAGNOSIS — R569 Unspecified convulsions: Secondary | ICD-10-CM | POA: Insufficient documentation

## 2022-10-14 DIAGNOSIS — Z5901 Sheltered homelessness: Secondary | ICD-10-CM | POA: Insufficient documentation

## 2022-10-14 DIAGNOSIS — R45851 Suicidal ideations: Secondary | ICD-10-CM | POA: Insufficient documentation

## 2022-10-14 DIAGNOSIS — F1990 Other psychoactive substance use, unspecified, uncomplicated: Secondary | ICD-10-CM

## 2022-10-14 NOTE — Progress Notes (Signed)
   10/14/22 0206  Patient Reported Information  How Did You Hear About [place]? Self  What Is the Reason for Your Visit/Call Today? Pt is a 26 year old male who presents voluntary and unaccompanied to [facility]. Pt reports, yesterday (10/12/2022) after using a lot of Crack Cocaine; he had a seizure, he then went to the ED. Pt reports, he bit his tongue but he did not tell medical staff. Clinician expressed to the pt per notes in his chart it is documented he bit his tongue. Pt reports, his tongue was swollen and he was given Tylenol. Pt reports, his tongue is still swollen. Pt reports, he doesn't think it was a seizure but a panic attack. Pt reports, he hallucinates how's he's going to die. Clinician asked the pt what type of help was he seeking. Pt replied, "it's really cold in the streets, in a place I use drugs to make it to the next day." Clinician repeated the question, pt replied, "I want to be admitted." Pt reports, if discharged he can contract for safety (not hurting himself or others.) Pt denies, SI, HI, self-injurious behaviors and access to weapons.  How Long Has This Been Causing You Problems? > than 6 months  What Do You Feel Would Help You the Most Today? Housing Assistance;Alcohol or Drug Use Treatment  Have You Recently Had Any Thoughts About Hurting Yourself? No  Are You Planning to Commit Suicide/Harm Yourself At This time? No  Have you Recently Had Thoughts About [place]? No  Are You Planning To Harm Someone At This Time? No  Have You Used Any Alcohol or Drugs in the Past 24 Hours? Yes  What Did You Use and How Much? Pt reports, using a lot of Crack Cocaine yesterday (10/12/2022) afterwards he had a panic attack/seisure. Pt reports, he uses Crack Cocaine everyday. Pt reports, he doesn't use Marijuana a lot.  Do You Currently Have a Therapist/Psychiatrist? No  Have You Been Recently Discharged From Any Office Practice or Programs?  ([facility]A)  CCA Screening Triage Referral  Assessment  Type of Contact Face-to-Face  Is this Initial or Reassessment? Initial Assessment  Location of Assessment [facility] Assessment Services  Provider location [facility] Assessment Services  Collateral Involvement None. Pt reports, he has no one to call. Pt reports, he has no supports.  Does Patient Have a Stage manager Guardian? No  Is CPS involved or ever been involved? Never  Is APS involved or ever been involved? Never  Patient Determined To Be At Risk for Harm To Self or Others Based on Review of Patient Reported Information or Presenting Complaint? No  Method  (Pt denies, HI.)  Intent  (Pt denies, HI.)  Notification Required  (Pt denies, HI.)  Additional Information for Danger to Others Potential  (Pt denies, HI.)  Are There Guns or Other Weapons in [place]? No  Do You Have any Outstanding Charges, Pending Court Dates, Parole/Probation? Pt denies.  Contacted To Inform of Risk of Harm To Self or Others:  (NA)  Does Patient Present under Involuntary Commitment? No  [place] of Residence [place]  Patient Currently Receiving the Following Services: Not Receiving Services  Determination of Need Routine (7 days)  Options For Referral Outpatient Therapy;Medication Management;Other: Comment (Pt to follow up with [facility] to address substance use needs.)    Determination: Routine.    Vertell Novak, [place], [facility], [facility] Triage Specialist [phone]

## 2022-10-14 NOTE — ED Provider Notes (Signed)
Behavioral Health Urgent Care Medical Screening Exam  Patient Name: Tyler White MRN: [id number] Date of Evaluation: 10/14/22 Chief Complaint:  substance use Diagnosis:  Final diagnoses:  Cocaine use disorder ([place])  Schizoaffective disorder, bipolar type ([place])  Substance use disorder    History of Present illness: Tyler White is a 26 y.o. male homeless and unemployed with a history of schizoaffective disorder, bipolar type and homelessness. Patient reports that he has been using crack cocaine and he wants help with his substance use.   Patient states that he has been using crack cocaine was mostly recently used 20.00 worth of cocaine yesterday. Patient reports taht he was living with his mother but she lost her section 8 housing and is now living in a motel. Patient presented with a similar complaint to [facility] of SI and hallucinations on 10/09/22. During that visit it was noted that patient "behaviors are more consistent with someone who is acting in self-preservation, rather than someone who is acutely suicidal.  Patient ongoing endorsement of suicidal ideation shows clear evidence of secondary gain of unmet needs that is representative of limited and often- maladaptive coping skills and rather than an indicator of imminent risk of death".   On assessment patient is lying down on two chairs in the assessment room with his eyes closed and  and appears to be sleeping. During this assessment patient initially denied any suicidal ideations to TTS and stated that he needed somewhere to stay because it was cold outside. Patient stated he ws suicidal due to his substance use. NP discussed with patient receiving substance treatment at [facility], patient then stated that he wanted to be admitted until he went to [facility]. When I explained to the patient that he could go to [facility] in the morning, patient then stated that he did not have anywhere to go and that he was suicidal.  Patient endorses some passive suicidal ideations without a specific plan or intent to harm himself. Patient is alert oriented x4,  calm and does not appear to be in any distress or imminent danger to himself or others at this time and does appear to be responding to any internal or external stimuli. Patient will be discharged and given resources to follow up in the community for outpatient substance abuse treatment.   Of note patient recently presented to [facility] ED on 10/12/22 via EMS after having seizure in the context of cocaine use.   Psychiatric Specialty Exam  Presentation  General Appearance:Disheveled  Eye Contact:Fair  Speech:Clear and Coherent  Speech Volume:Normal  Handedness:Right   Mood and Affect  Mood: Euthymic  Affect: Congruent   Thought Process  Thought Processes: Coherent  Descriptions of Associations:Intact  Orientation:Full (Time, Place and Person)  Thought Content:WDL  Diagnosis of Schizophrenia or Schizoaffective disorder in past: No   Hallucinations:None NA I hear voices " they tell me different stuff" "like dark circles and stuff".  Ideas of Reference:None  Suicidal Thoughts:Yes, Passive Without Intent; Without Plan Without Intent; Without Plan  Homicidal Thoughts:No   Sensorium  Memory: Immediate Good; Recent Good; Remote Good  Judgment: Fair  Insight: Fair   Materials engineer: Fair  Attention Span: Fair  Recall: [employer] of Knowledge: Fair  Language: Fair   Psychomotor Activity  Psychomotor Activity: Normal -- (NA) No   Assets  Assets: Desire for Improvement; Communication Skills; Physical Health   Sleep  Sleep: Fair  Number of hours:  -1   No data recorded  Physical  Exam: Physical Exam HENT:     Head: Normocephalic.  Eyes:     Pupils: Pupils are equal, round, and reactive to light.  Cardiovascular:     Rate and Rhythm: Normal rate.  Pulmonary:     Effort:  Pulmonary effort is normal.  Abdominal:     General: Abdomen is flat.  Musculoskeletal:        General: Normal range of motion.     Cervical back: Normal range of motion.  Skin:    General: Skin is warm.  Neurological:     Mental Status: He is alert and oriented to person, place, and time.  Psychiatric:        Attention and Perception: Attention normal.        Mood and Affect: Affect is blunt.        Speech: Speech normal.        Behavior: Behavior is cooperative.        Thought Content: Thought content is not paranoid or delusional. Thought content includes suicidal ideation. Thought content does not include homicidal ideation. Thought content does not include homicidal or suicidal plan.        Cognition and Memory: Cognition normal.        Judgment: Judgment is impulsive.    Review of Systems  Constitutional: Negative.   HENT: Negative.    Eyes: Negative.   Respiratory: Negative.    Cardiovascular: Negative.   Gastrointestinal: Negative.   Genitourinary: Negative.   Musculoskeletal: Negative.   Skin: Negative.   Neurological: Negative.   Endo/Heme/Allergies: Negative.   Psychiatric/Behavioral:  Positive for substance abuse and suicidal ideas.    Blood pressure (!) 145/77, pulse 78, temperature 97.9 F (36.6 C), temperature source Oral, resp. rate 16, SpO2 100 %. There is no height or weight on file to calculate BMI.  Musculoskeletal: Strength & Muscle Tone: within normal limits Gait & Station: normal Patient leans: N/A   [place] MSE Discharge Disposition for Follow up and Recommendations: Based on my evaluation the patient does not appear to have an emergency medical condition and can be discharged with resources and follow up care in outpatient services for Medication Management, Substance Abuse Intensive Outpatient Program, and Individual Therapy. Patient can follow up with outpatient substance abuse services at [facility].    Lucia Bitter,  NP 10/14/2022, 6:38 AM

## 2022-10-14 NOTE — Discharge Instructions (Addendum)
  Discharge recommendations:  Patient is to take medications as prescribed. Please see information for follow-up appointment with psychiatry and therapy. Please follow up with your primary care provider for all medical related needs.  Follow up with [facility] [address] [place], [place] [address] [phone] Therapy: We recommend that patient participate in individual therapy to address mental health concerns.  Medications: The parent/guardian is to contact a medical professional and/or outpatient provider to address any new side effects that develop. Parent/guardian should update outpatient providers of any new medications and/or medication changes.   Atypical antipsychotics: If you are prescribed an atypical antipsychotic, it is recommended that your height, weight, BMI, blood pressure, fasting lipid panel, and fasting blood sugar be monitored by your outpatient providers.  Safety:  The patient should abstain from use of illicit substances/drugs and abuse of any medications. If symptoms worsen or do not continue to improve or if the patient becomes actively suicidal or homicidal then it is recommended that the patient return to the closest hospital emergency department, the [facility], or call 911 for further evaluation and treatment. National Suicide Prevention Lifeline 1-800-SUICIDE or (423)110-8020.  About 988 988 offers 24/7 access to trained crisis counselors who can help people experiencing mental health-related distress. People can call or text 988 or chat 988lifeline.org for themselves or if they are worried about a loved one who may need crisis support.  Crisis Mobile: [facility]:                     [phone] (for crisis response 24 hours a day) [place]:                                            [phone]

## 2022-10-17 ENCOUNTER — Emergency Department (HOSPITAL_COMMUNITY)
Admission: EM | Admit: 2022-10-17 | Discharge: 2022-10-17 | Disposition: A | Discharge status: Home / Self Care | Payer: Medicaid Other | Attending: Physician Assistant | Admitting: Physician Assistant

## 2022-10-17 ENCOUNTER — Other Ambulatory Visit: Payer: Self-pay

## 2022-10-17 ENCOUNTER — Encounter (HOSPITAL_COMMUNITY): Payer: Self-pay | Admitting: Emergency Medicine

## 2022-10-17 DIAGNOSIS — Z202 Contact with and (suspected) exposure to infections with a predominantly sexual mode of transmission: Secondary | ICD-10-CM | POA: Insufficient documentation

## 2022-10-17 DIAGNOSIS — R369 Urethral discharge, unspecified: Secondary | ICD-10-CM | POA: Diagnosis present

## 2022-10-17 MED ORDER — METRONIDAZOLE 500 MG PO TABS
2000.0000 mg | ORAL_TABLET | Freq: Once | ORAL | Status: AC
  Administered 2022-10-17: 2000 mg via ORAL
  Filled 2022-10-17: qty 4

## 2022-10-17 MED ORDER — CEFTRIAXONE SODIUM 500 MG IJ SOLR
500.0000 mg | Freq: Once | INTRAMUSCULAR | Status: AC
  Administered 2022-10-17: 500 mg via INTRAMUSCULAR
  Filled 2022-10-17: qty 500

## 2022-10-17 MED ORDER — LIDOCAINE HCL (PF) 1 % IJ SOLN
INTRAMUSCULAR | Status: AC
  Administered 2022-10-17: 2.1 mL
  Filled 2022-10-17: qty 5

## 2022-10-17 MED ORDER — DOXYCYCLINE HYCLATE 100 MG PO TABS
100.0000 mg | ORAL_TABLET | Freq: Once | ORAL | Status: AC
  Administered 2022-10-17: 100 mg via ORAL
  Filled 2022-10-17: qty 1

## 2022-10-17 NOTE — ED Provider Notes (Signed)
[facility] EMERGENCY DEPARTMENT Provider Note   CSN: [id number] Arrival date & time: 10/17/22  2322     History  Chief Complaint  Patient presents with   STD treatment     Tyler White is a 26 y.o. male.  HPI 26 year old male with a history of hernia, schizophrenia resents to the ER with complaints of green discharge x1.5 days.  Had recent protected sex.  Concerned about an STD.  Denies any testicular pain.  No difficulty urinating.    Home Medications Prior to Admission medications   Medication Sig Start Date End Date Taking? Authorizing Provider  acetaminophen (TYLENOL) 500 MG tablet Take 1,000 mg by mouth every 6 (six) hours as needed for moderate pain.    [provider]  paliperidone (INVEGA SUSTENNA) 234 MG/1.5ML SUSY injection Inject 234 mg into the muscle once for 1 dose. Patient taking differently: Inject 234 mg into the muscle every 30 (thirty) days. 04/16/22 10/11/22  Tharon Aquas, NP  gabapentin (NEURONTIN) 400 MG capsule Take 1 capsule (400 mg total) by mouth 3 (three) times daily. Patient not taking: Reported on 06/09/2021 04/30/21 06/10/21  Ethelene Hal, NP      Allergies    Patient has no known allergies.    Review of Systems   Review of Systems Ten systems reviewed and are negative for acute change, except as noted in the HPI.   Physical Exam Updated Vital Signs BP 133/78 (BP Location: Right Arm)   Pulse 93   Temp 99 F (37.2 C) (Oral)   Resp 18   SpO2 96%  Physical Exam Vitals and nursing note reviewed.  Constitutional:      General: He is not in acute distress.    Appearance: He is well-developed.  HENT:     Head: Normocephalic and atraumatic.  Eyes:     Conjunctiva/sclera: Conjunctivae normal.  Cardiovascular:     Rate and Rhythm: Normal rate and regular rhythm.     Heart sounds: No murmur heard. Pulmonary:     Effort: Pulmonary effort is normal. No respiratory distress.     Breath sounds:  Normal breath sounds.  Abdominal:     Palpations: Abdomen is soft.     Tenderness: There is no abdominal tenderness.  Genitourinary:    Penis: Normal.      Testes: Normal.  Musculoskeletal:        General: No swelling.     Cervical back: Neck supple.  Skin:    General: Skin is warm and dry.     Capillary Refill: Capillary refill takes less than 2 seconds.  Neurological:     Mental Status: He is alert.  Psychiatric:        Mood and Affect: Mood normal.     ED Results / Procedures / Treatments   Labs (all labs ordered are listed, but only abnormal results are displayed) Labs Reviewed  URINALYSIS, ROUTINE W REFLEX MICROSCOPIC  GC/CHLAMYDIA PROBE AMP ([facility]) NOT AT [facility]    EKG None  Radiology No results found.  Procedures Procedures    Medications Ordered in ED Medications  cefTRIAXone (ROCEPHIN) injection 500 mg (has no administration in time range)  doxycycline (VIBRA-TABS) tablet 100 mg (has no administration in time range)  metroNIDAZOLE (FLAGYL) tablet 2,000 mg (has no administration in time range)    ED Course/ Medical Decision Making/ A&P  Medical Decision Making Amount and/or Complexity of Data Reviewed Labs: ordered.  Risk Prescription drug management.   Pt presents with concerns for possible STD.  Pt understands that they have GC/Chlamydia cultures pending and that they will need to inform all sexual partners if results return positive. Pt has been treated prophylactically with doxycycline, Flagyl and RocephinPt has been advised to not drink alcohol while on this medication.  Discussed importance of using protection when sexually active.   Final Clinical Impression(s) / ED Diagnoses Final diagnoses:  Exposure to sexually transmitted disease (STD)    Rx / DC Orders ED Discharge Orders     None         Lyndel Safe 10/17/22 2348    Quintella Reichert, MD 10/18/22 [phone]

## 2022-10-17 NOTE — ED Provider Triage Note (Signed)
Emergency Medicine Provider Triage Evaluation Note  Tyler White , a 26 y.o. male  was evaluated in triage.  Pt complains of concerns for STD.  Has been having green penile discharge since yesterday.  Had recent unprotected sex.  Denies any difficulty urinating.  No testicular pain.  Review of Systems  Positive: As above Negative: As above  Physical Exam  There were no vitals taken for this visit. Gen:   Awake, no distress   Resp:  Normal effort  MSK:   Moves extremities without difficulty  Other:  Review exam deferred  Medical Decision Making  Medically screening exam initiated at 11:41 PM.  Appropriate orders placed.  Tyler White was informed that the remainder of the evaluation will be completed by another provider, this initial triage assessment does not replace that evaluation, and the importance of remaining in the ED until their evaluation is complete.     Garald Balding, PA-C 10/17/22 2342

## 2022-10-17 NOTE — Discharge Instructions (Addendum)
Do not have sex for 2 weeks Have all partners tested and treated Results are pending, please follow-up on them via the MyChart app.  If your test is abnormal, you will be called but you have been treated for Gonorrhea and Chlamydia today. You can also review your results on MyChart Practice safe sex and use a condom to prevent infection or unwanted pregnancy Follow up with the Health Department

## 2022-10-17 NOTE — ED Triage Notes (Signed)
Patient requesting STD treatment for his penile discharge onset yesterday .

## 2022-10-18 LAB — URINALYSIS, ROUTINE W REFLEX MICROSCOPIC
Bilirubin Urine: NEGATIVE
Glucose, UA: NEGATIVE mg/dL
Hgb urine dipstick: NEGATIVE
Ketones, ur: NEGATIVE mg/dL
Nitrite: NEGATIVE
Protein, ur: 30 mg/dL — AB
Specific Gravity, Urine: 1.03 (ref 1.005–1.030)
WBC, UA: >50 WBC/hpf — ABNORMAL HIGH (ref 0–5)
pH: 5 (ref 5.0–8.0)

## 2022-10-22 DIAGNOSIS — Z5900 Homelessness unspecified: Secondary | ICD-10-CM | POA: Insufficient documentation

## 2022-10-22 DIAGNOSIS — Z91148 Patient's other noncompliance with medication regimen for other reason: Secondary | ICD-10-CM | POA: Insufficient documentation

## 2022-10-22 DIAGNOSIS — F259 Schizoaffective disorder, unspecified: Secondary | ICD-10-CM | POA: Insufficient documentation

## 2022-10-22 DIAGNOSIS — Z560 Unemployment, unspecified: Secondary | ICD-10-CM | POA: Insufficient documentation

## 2022-10-22 DIAGNOSIS — Z765 Malingerer [conscious simulation]: Secondary | ICD-10-CM | POA: Insufficient documentation

## 2022-10-22 DIAGNOSIS — Z760 Encounter for issue of repeat prescription: Secondary | ICD-10-CM | POA: Insufficient documentation

## 2022-10-22 DIAGNOSIS — R45851 Suicidal ideations: Secondary | ICD-10-CM | POA: Insufficient documentation

## 2022-10-22 NOTE — ED Triage Notes (Signed)
Pt presents to [facility] voluntarily, unaccompanied with complaint of suicidal ideation with no plan. Pt states " I just need to be admitted".Pt is currently unemployed with a history of schizoaffective disorder, bipolar type and homelessness. Pt reports that he has not had invega shot in a few months. Pt denies HI and substance/alcohol use.

## 2022-10-23 ENCOUNTER — Ambulatory Visit (HOSPITAL_COMMUNITY)
Admission: EM | Admit: 2022-10-23 | Discharge: 2022-10-23 | Disposition: A | Discharge status: Home / Self Care | Payer: Medicaid Other | Attending: Nurse Practitioner | Admitting: Nurse Practitioner

## 2022-10-23 ENCOUNTER — Emergency Department (HOSPITAL_COMMUNITY)
Admission: EM | Admit: 2022-10-23 | Discharge: 2022-10-23 | Disposition: A | Discharge status: Home / Self Care | Payer: Medicaid Other | Attending: Emergency Medicine | Admitting: Emergency Medicine

## 2022-10-23 ENCOUNTER — Encounter (HOSPITAL_COMMUNITY): Payer: Self-pay

## 2022-10-23 ENCOUNTER — Other Ambulatory Visit: Payer: Self-pay

## 2022-10-23 DIAGNOSIS — Z760 Encounter for issue of repeat prescription: Secondary | ICD-10-CM

## 2022-10-23 DIAGNOSIS — F419 Anxiety disorder, unspecified: Secondary | ICD-10-CM | POA: Diagnosis present

## 2022-10-23 DIAGNOSIS — Z5900 Homelessness unspecified: Secondary | ICD-10-CM | POA: Diagnosis not present

## 2022-10-23 NOTE — Discharge Instructions (Signed)
SHELTERS:  [facility]: [address] [place], [place] [address] [phone]  32 beds for men experiencing homelessness.  Call for openings  [facility] [place], [place]  [address] [phone] ext [phone] Monday-Friday 8am-3pm Saturday-Sunday: 8am-2Pm Safe place to rest, take care of basic needs, do laundry and etc.    [facility] [place], [place] [address] [phone]  Clinic:  [facility] [place], [place] [address] [phone]  [facility] pharmacy provides patient medications in the event they are unable to afford their prescriptions.  They will ask about income, SS, and household size.    Discharge recommendations:  Patient is to take medications as prescribed. Please see information for follow-up appointment with psychiatry and therapy. Please follow up with your primary care provider for all medical related needs.   Therapy: We recommend that patient participate in individual therapy to address mental health concerns.  Medications: The parent/guardian is to contact a medical professional and/or outpatient provider to address any new side effects that develop. Parent/guardian should update outpatient providers of any new medications and/or medication changes.   Atypical antipsychotics: If you are prescribed an atypical antipsychotic, it is recommended that your height, weight, BMI, blood pressure, fasting lipid panel, and fasting blood sugar be monitored by your outpatient providers.  Safety:  The patient should abstain from use of illicit substances/drugs and abuse of any medications. If symptoms worsen or do not continue to improve or if the patient becomes actively suicidal or homicidal then it is recommended that the patient return to the closest hospital emergency department, the [facility], or call 911 for further evaluation and  treatment. National Suicide Prevention Lifeline 1-800-SUICIDE or 562 389 0912.  About 988 988 offers 24/7 access to trained crisis counselors who can help people experiencing mental health-related distress. People can call or text 988 or chat 988lifeline.org for themselves or if they are worried about a loved one who may need crisis support.  Crisis Mobile: [facility]:                     [phone] (for crisis response 24 hours a day) [place]:                                            [phone]

## 2022-10-23 NOTE — Discharge Instructions (Addendum)
Please contact shelter for availability. Follow up with PCP in 3-5 days. Please do not hesitate to return to ED if the worrisome signs and symptoms we discussed become apparent.

## 2022-10-23 NOTE — ED Provider Notes (Signed)
Behavioral Health Urgent Care Medical Screening Exam  Patient Name: Tyler White MRN: [id number] Date of Evaluation: 10/23/22 Chief Complaint: " I came here tonight because of my shot" Diagnosis:  Final diagnoses:  Encounter for medication refill    History of Present illness: Tyler White is a 26 y.o. male with psychiatric history of schizoaffective disorder, substance use disorder, malingering, presented voluntarily to [facility] as a walk-in with complaint of passive SI.  Patient initially reported experiencing suicidal thoughts and denies an active plan for self-harm.  Patient reports " I want to be admitted because I don't have a place to sleep tonight, but if I'm not being admitted, can I have something to eat and drink, and then I can go to the shelters in the morning".   Patient is well known to the [facility] service line with multiple ED presentations for psychiatric care.. Patient admits to being homeless and has utilized the ER for sleeping over the last few months. Patient has had more than 48 Emergency Department visits within the past 6 months for malingering and mental health related issues. Patient also has a history of medication non-compliance, but is now maintained on monthly InVega Sustena shots at [facility] day clinic.   Patient was last seen at the [facility] 10/13/22, but received his Invega shot on 10/03/22 with next appointment due in a month.  The patient has  a history of being a "no show"  at several previously scheduled appointments, therefore outpatient [facility] recommended that patient walk-in to establish psychiatric service during open access clinic hours.   Patient reports " tonight, I came here because I need my shot, and oh well, I still have visual hallucinations like someone is touching me".  Patient reports " I'm homeless and I feel like sleeping and not waking up, and Ive felt this way since I was 18".  Patient endorses chronic passive SI, no plan, denies HI, denies  AH.  Patient denies history of self-harm behaviors.  Pt denies access to a gun/weapon. Patient reports his sleep and appetite as fair.  Patient is unemployed.   Patient reports he last used cocaine a week ago. Patient denies other illicit drug use.    Support, encouragement and reassurance provided about ongoing stressors. Patient provided with opportunity for questions, and informed per chart review, he got his last invega shot 10/03/22 with next appointment on 11/03/22 here at the [facility] outpatient clinic. Patient also encouraged to call the [facility] outpatient clinic in am if further information is needed. Patient verbalized his understanding.   On evaluation, patient is alert, oriented x 4, and cooperative. Speech is clear and coherent. Pt appears disheveled. Eye contact is fair. Mood is euthymic, affect is congruent with mood. Thought process and thought content is coherent. Pt endorses chronic passive SI, no plan or intent, denies HI/AVH. There is no indication that the patient is responding to internal stimuli. No delusions elicited during this assessment.    Psychiatric Specialty Exam  Presentation  General Appearance:Disheveled  Eye Contact:Fair  Speech:Clear and Coherent  Speech Volume:Normal  Handedness:Right   Mood and Affect  Mood: Euthymic  Affect: Congruent   Thought Process  Thought Processes: Coherent  Descriptions of Associations:Intact  Orientation:Full (Time, Place and Person)  Thought Content:WDL  Diagnosis of Schizophrenia or Schizoaffective disorder in past: No   Hallucinations:Tactile NA Pt reports " I have hallucinations like someone is touching me". "like dark circles and stuff".  Ideas of Reference:None  Suicidal Thoughts:Yes, Passive Without Intent; Without Plan Without Intent;  Without Plan  Homicidal Thoughts:No   Sensorium  Memory: Immediate Fair  Judgment: Fair  Insight: Fair   Manufacturing systems engineer: Fair  Attention Span: Fair  Recall: [employer] of Knowledge: Fair  Language: Fair   Psychomotor Activity  Psychomotor Activity: Normal -- (NA) No   Assets  Assets: Armed forces logistics/support/administrative officer; Desire for Improvement; Physical Health   Sleep  Sleep: Fair  Number of hours:  -1   Nutritional Assessment (For OBS and FBC admissions only) Has the patient had a weight loss or gain of 10 pounds or more in the last 3 months?: No Has the patient had a decrease in food intake/or appetite?: No Does the patient have dental problems?: No Does the patient have eating habits or behaviors that may be indicators of an eating disorder including binging or inducing vomiting?: No Has the patient recently lost weight without trying?: 0 Has the patient been eating poorly because of a decreased appetite?: 0 Malnutrition Screening Tool Score: 0    Physical Exam: Physical Exam Constitutional:      General: He is not in acute distress.    Appearance: He is not diaphoretic.  HENT:     Head: Normocephalic.     Right Ear: External ear normal.     Left Ear: External ear normal.     Nose: No congestion.  Eyes:     General:        Right eye: No discharge.        Left eye: No discharge.  Cardiovascular:     Rate and Rhythm: Normal rate.  Pulmonary:     Effort: Pulmonary effort is normal. No respiratory distress.  Chest:     Chest wall: No tenderness.  Neurological:     Mental Status: He is alert and oriented to person, place, and time.  Psychiatric:        Attention and Perception: Attention and perception normal.        Mood and Affect: Mood and affect normal.        Speech: Speech normal.        Behavior: Behavior is cooperative.        Thought Content: Thought content normal.        Cognition and Memory: Cognition and memory normal.        Judgment: Judgment normal.    Review of Systems  Constitutional:  Negative for chills, diaphoresis and fever.  HENT:   Negative for congestion.   Eyes:  Negative for discharge.  Respiratory:  Negative for cough, shortness of breath and wheezing.   Cardiovascular:  Negative for chest pain and palpitations.  Gastrointestinal:  Negative for diarrhea, nausea and vomiting.  Neurological:  Negative for tingling, seizures, loss of consciousness, weakness and headaches.  Psychiatric/Behavioral:  Negative for depression, hallucinations, substance abuse and suicidal ideas. The patient is not nervous/anxious and does not have insomnia.    Blood pressure 122/74, pulse 88, temperature 97.9 F (36.6 C), temperature source Oral, resp. rate 20, SpO2 99 %. There is no height or weight on file to calculate BMI.  Musculoskeletal: Strength & Muscle Tone: within normal limits Gait & Station: normal Patient leans: N/A   [place] MSE Discharge Disposition for Follow up and Recommendations: Based on my evaluation the patient does not appear to have an emergency medical condition and can be discharged with resources and follow up care in outpatient services for Medication Management   Recommend discharge home and follow-up with outpatient psychiatric services for medication  management and substance abuse treatment. Patient provided with  area homeless/shelter resources and outpatient psychiatric resources.   Patient does not meet inpatient admission criteria or IVC criteria at this time. There is no evidence of imminent risk of harm to self or others.  Although the patient presented to the [place] secondary to chronic passive suicidal ideation, risk factors are mitigated by current protective factors such as no active psychosis, motivation for treatment, future planning, and patient's ability to vocalize needs and wants for help at any time.   Per chart review, "the patient's behaviors are more consistent with someone who is acting in self-preservation, rather than someone who is acutely suicidal.  Patient ongoing endorsement of passive  suicidal ideation shows clear evidence of secondary gain of unmet needs that is representative of limited and often- maladaptive coping skills and rather than an indicator of imminent risk of death".   Discussed calling crisis line, 911 or going to the ED if condition worsens/changes. Patient verbalized his understanding.   Discussed methods to reduce the risk of self-injury or suicide attempts: Frequent conversations regarding unsafe thoughts. Remove all significant sharps. Remove all firearms. Remove all medications, including over-the-counter meds. Consider lockbox for medications and having a responsible person dispense medications until patient has strengthened coping skills. Room checks for sharps or other harmful objects. Secure all chemical substances that can be ingested or inhaled.   Please refrain from using alcohol or illicit substances, as they can affect your mood and can cause depression, anxiety or other concerning symptoms.  Alcohol can increase the chance that a person will make reckless decisions, like attempting suicide, and can increase the lethality of a drug overdose.    Disp-Patient discharged and condition at discharge is stable.   Randon Goldsmith, NP 10/23/2022, 12:32 AM

## 2022-10-23 NOTE — ED Triage Notes (Signed)
Pt to er, pt states that he had a panic attack earlier to day, states that he has a hx of seizures and he is worried that his panic attack might lead to a seizures, pt eyes are closed and he is nodding off during questions.

## 2022-10-23 NOTE — ED Provider Notes (Signed)
[facility] DEPT Provider Note   CSN: [id number] Arrival date & time: 10/23/22  0754     History  Chief Complaint  Patient presents with   Anxiety    Tyler White is a 26 y.o. male.   Anxiety   26 year old male presents emergency department with complaints of increased anxiety.  Patient states that he was just recently discharged from behavioral health urgent care around midnight this morning.  He states he is homeless and lives on the streets.  He was unable to get in contact with shelters given time of day.  He slept outside and noticed increasingly feelings of becoming cold.  Reports as the temperature decreased, and the more anxious he felt.  Patient reports being compliant with his at home medications.  He acknowledges understanding of resources given to him by behavioral health including shelters and outpatient psychiatric resources.  He is currently requesting sandwich.  Reports history of seizures and states he was concerned earlier about potential recurrence of seizure given feelings of anxiety but denies any seizure-like activity.  Denies fever, chills, night sweats, chest pain, shortness of breath, dizziness, lightheadedness, headache, abdominal pain, nausea, vomiting, urinary symptoms, change in bowel habits.  Denies suicidal/homicidal ideation.  Denies auditory visual hallucinations.  Past medical history significant for inguinal hernia, schizophrenia, cocaine abuse, cannabis abuse, homelessness, malingering, and medical noncompliance  Home Medications Prior to Admission medications   Medication Sig Start Date End Date Taking? Authorizing Provider  acetaminophen (TYLENOL) 500 MG tablet Take 1,000 mg by mouth every 6 (six) hours as needed for moderate pain.    [provider]  paliperidone (INVEGA SUSTENNA) 234 MG/1.5ML SUSY injection Inject 234 mg into the muscle once for 1 dose. Patient taking differently: Inject 234 mg into the  muscle every 30 (thirty) days. 04/16/22 10/11/22  Tharon Aquas, NP  gabapentin (NEURONTIN) 400 MG capsule Take 1 capsule (400 mg total) by mouth 3 (three) times daily. Patient not taking: Reported on 06/09/2021 04/30/21 06/10/21  Ethelene Hal, NP      Allergies    Patient has no known allergies.    Review of Systems   Review of Systems  All other systems reviewed and are negative.   Physical Exam Updated Vital Signs BP 128/83   Pulse 60   Temp 98.2 F (36.8 C) (Oral)   Resp 16   Ht '5\' 5"'$  (1.651 m)   Wt 68 kg   SpO2 96%   BMI 24.96 kg/m  Physical Exam Vitals and nursing note reviewed.  Constitutional:      General: He is not in acute distress.    Appearance: Normal appearance. He is well-developed.  HENT:     Head: Normocephalic and atraumatic.  Eyes:     Conjunctiva/sclera: Conjunctivae normal.  Cardiovascular:     Rate and Rhythm: Normal rate and regular rhythm.     Heart sounds: No murmur heard. Pulmonary:     Effort: Pulmonary effort is normal. No respiratory distress.     Breath sounds: Normal breath sounds.  Abdominal:     Palpations: Abdomen is soft.     Tenderness: There is no abdominal tenderness.  Musculoskeletal:        General: No swelling.     Cervical back: Neck supple.  Skin:    General: Skin is warm and dry.     Capillary Refill: Capillary refill takes less than 2 seconds.  Neurological:     Mental Status: He is alert.  Psychiatric:  Mood and Affect: Mood normal.     ED Results / Procedures / Treatments   Labs (all labs ordered are listed, but only abnormal results are displayed) Labs Reviewed - No data to display  EKG None  Radiology No results found.  Procedures Procedures    Medications Ordered in ED Medications - No data to display  ED Course/ Medical Decision Making/ A&P                           Medical Decision Making  This patient presents to the ED for concern of anxiety, this involves an extensive  number of treatment options, and is a complaint that carries with it a high risk of complications and morbidity.  The differential diagnosis includes, paranoia, drug withdrawal/intoxication, anxiety, seizure   Co morbidities that complicate the patient evaluation  See HPI   Additional history obtained:  Additional history obtained from EMR External records from outside source obtained and reviewed including hospital records   Lab Tests:  N/a   Imaging Studies ordered:  N/a   Cardiac Monitoring: / EKG:  The patient was maintained on a cardiac monitor.  I personally viewed and interpreted the cardiac monitored which showed an underlying rhythm of: Sinus rhythm   Consultations Obtained:  N/a   Problem List / ED Course / Critical interventions / Medication management  Anxiety Reevaluation of the patient showed that the patient stayed the same I have reviewed the patients home medicines and have made adjustments as needed   Social Determinants of Health:  Homelessness.  Denies tobacco/illicit drug use.   Test / Admission - Considered:  Anxiety Vitals signs within normal range and stable throughout visit. Patient symptoms likely secondary to self-reported anxiety.  Patient is seen by behavioral health and discharge earlier today.  I discussed at length with patient regarding potential housing opportunities given increased anxiety felt by sleeping outside in the cold.  Appropriate resources were provided to the patient.  No further work-up deemed necessary at this time.  Treatment plan discussed at length with patient and he acknowledged understand was agreeable to said plan. Worrisome signs and symptoms were discussed with the patient, and the patient acknowledged understanding to return to the ED if noticed. Patient was stable upon discharge.          Final Clinical Impression(s) / ED Diagnoses Final diagnoses:  Anxiety    Rx / DC Orders ED Discharge  Orders     None         Wilnette Kales, [place] 10/23/22 2200    Regan Lemming, MD 10/24/22 1250

## 2022-10-23 NOTE — Progress Notes (Signed)
[facility] entered resources into AVS  [place] Adiah Guereca [facility]

## 2022-10-27 ENCOUNTER — Encounter (HOSPITAL_BASED_OUTPATIENT_CLINIC_OR_DEPARTMENT_OTHER): Payer: Self-pay

## 2022-10-27 ENCOUNTER — Other Ambulatory Visit: Payer: Self-pay

## 2022-10-27 ENCOUNTER — Emergency Department (HOSPITAL_BASED_OUTPATIENT_CLINIC_OR_DEPARTMENT_OTHER)
Admission: EM | Admit: 2022-10-27 | Discharge: 2022-10-27 | Disposition: A | Discharge status: Home / Self Care | Payer: Medicaid Other | Attending: Emergency Medicine | Admitting: Emergency Medicine

## 2022-10-27 DIAGNOSIS — F10920 Alcohol use, unspecified with intoxication, uncomplicated: Secondary | ICD-10-CM

## 2022-10-27 DIAGNOSIS — F10129 Alcohol abuse with intoxication, unspecified: Secondary | ICD-10-CM | POA: Insufficient documentation

## 2022-10-27 DIAGNOSIS — Y909 Presence of alcohol in blood, level not specified: Secondary | ICD-10-CM | POA: Insufficient documentation

## 2022-10-27 NOTE — ED Triage Notes (Signed)
Patient arrive via EMS due to having one drink (one 4 Loco). Patient called EMS to [place] for further assistance. EMS Vitals  BP 130/76  RR 16 HR 96

## 2022-10-27 NOTE — ED Provider Notes (Signed)
[facility] EMERGENCY DEPT Provider Note   CSN: [id number] Arrival date & time: 10/27/22  1439     History  Chief Complaint  Patient presents with   Alcohol Intoxication    Tyler White is a 26 y.o. male presented ED with no specific complaint.  Patient ports he feels hung over.  He had a for liquids earlier.  He called EMS at [facility] and asked to be brought to the hospital to get "checked out".  By the time I am seeing him, he reports he is hungry and he wants to leave.  He has no other medical concerns  HPI     Home Medications Prior to Admission medications   Medication Sig Start Date End Date Taking? Authorizing Provider  acetaminophen (TYLENOL) 500 MG tablet Take 1,000 mg by mouth every 6 (six) hours as needed for moderate pain.    [provider]  paliperidone (INVEGA SUSTENNA) 234 MG/1.5ML SUSY injection Inject 234 mg into the muscle once for 1 dose. Patient taking differently: Inject 234 mg into the muscle every 30 (thirty) days. 04/16/22 10/11/22  Tharon Aquas, NP  gabapentin (NEURONTIN) 400 MG capsule Take 1 capsule (400 mg total) by mouth 3 (three) times daily. Patient not taking: Reported on 06/09/2021 04/30/21 06/10/21  Ethelene Hal, NP      Allergies    Patient has no known allergies.    Review of Systems   Review of Systems  Physical Exam Updated Vital Signs BP 120/71 (BP Location: Right Arm)   Pulse 63   Temp 98.4 F (36.9 C) (Oral)   Resp 18   Ht '5\' 8"'$  (1.727 m)   Wt 69.9 kg   SpO2 100%   BMI 23.42 kg/m  Physical Exam Constitutional:      General: He is not in acute distress. HENT:     Head: Normocephalic and atraumatic.  Eyes:     Conjunctiva/sclera: Conjunctivae normal.     Pupils: Pupils are equal, round, and reactive to light.  Cardiovascular:     Rate and Rhythm: Normal rate and regular rhythm.  Pulmonary:     Effort: Pulmonary effort is normal. No respiratory distress.  Abdominal:     General:  There is no distension.     Tenderness: There is no abdominal tenderness.  Skin:    General: Skin is warm and dry.  Neurological:     General: No focal deficit present.     Mental Status: He is alert. Mental status is at baseline.  Psychiatric:        Mood and Affect: Mood normal.        Behavior: Behavior normal.     ED Results / Procedures / Treatments   Labs (all labs ordered are listed, but only abnormal results are displayed) Labs Reviewed - No data to display  EKG None  Radiology No results found.  Procedures Procedures    Medications Ordered in ED Medications - No data to display  ED Course/ Medical Decision Making/ A&P                           Medical Decision Making  Patient is here with no medical concerns at this time.  He appears relatively clinically sober per my evaluation, now after 6 hours in the ED.  He is walking stably.  P.o. challenge.  Okay for discharge.  No indication for further work-up or labs on an emergency basis  Final Clinical Impression(s) / ED Diagnoses Final diagnoses:  Alcoholic intoxication without complication [facility])    Rx / DC Orders ED Discharge Orders     None         Wyvonnia Dusky, MD 10/27/22 2116

## 2022-10-27 NOTE — ED Triage Notes (Signed)
Pt states he feels fine, just hungover. Denies any complaints or symptoms. Vss. Pt provided some graham crackers and ginger ale

## 2022-10-31 ENCOUNTER — Ambulatory Visit (HOSPITAL_COMMUNITY)
Admission: EM | Admit: 2022-10-31 | Discharge: 2022-11-02 | Disposition: A | Discharge status: Home / Self Care | Payer: Medicaid Other | Attending: Psychiatry | Admitting: Psychiatry

## 2022-10-31 ENCOUNTER — Emergency Department (HOSPITAL_COMMUNITY)
Admission: EM | Admit: 2022-10-31 | Discharge: 2022-10-31 | Disposition: A | Discharge status: Other Acute Inpatient Hospital | Payer: Medicaid Other | Attending: Emergency Medicine | Admitting: Emergency Medicine

## 2022-10-31 ENCOUNTER — Encounter (HOSPITAL_COMMUNITY): Payer: Self-pay | Admitting: Emergency Medicine

## 2022-10-31 ENCOUNTER — Other Ambulatory Visit: Payer: Self-pay

## 2022-10-31 DIAGNOSIS — R45851 Suicidal ideations: Secondary | ICD-10-CM | POA: Insufficient documentation

## 2022-10-31 DIAGNOSIS — F32A Depression, unspecified: Secondary | ICD-10-CM | POA: Insufficient documentation

## 2022-10-31 DIAGNOSIS — F17210 Nicotine dependence, cigarettes, uncomplicated: Secondary | ICD-10-CM | POA: Insufficient documentation

## 2022-10-31 DIAGNOSIS — Z5900 Homelessness unspecified: Secondary | ICD-10-CM | POA: Diagnosis not present

## 2022-10-31 DIAGNOSIS — Z1152 Encounter for screening for COVID-19: Secondary | ICD-10-CM | POA: Diagnosis not present

## 2022-10-31 DIAGNOSIS — R440 Auditory hallucinations: Secondary | ICD-10-CM | POA: Insufficient documentation

## 2022-10-31 DIAGNOSIS — F12150 Cannabis abuse with psychotic disorder with delusions: Secondary | ICD-10-CM

## 2022-10-31 HISTORY — DX: Suicidal ideations: R45.851

## 2022-10-31 LAB — SARS CORONAVIRUS 2 (TAT 6-24 HRS): SARS Coronavirus 2: NEGATIVE

## 2022-10-31 LAB — RAPID URINE DRUG SCREEN, HOSP PERFORMED
Amphetamines: NOT DETECTED
Barbiturates: NOT DETECTED
Benzodiazepines: NOT DETECTED
Cocaine: POSITIVE — AB
Opiates: NOT DETECTED
Tetrahydrocannabinol: POSITIVE — AB

## 2022-10-31 LAB — ETHANOL: Alcohol, Ethyl (B): <10 mg/dL (ref <10)

## 2022-10-31 MED ORDER — HYDROXYZINE HCL 25 MG PO TABS
25.0000 mg | ORAL_TABLET | Freq: Three times a day (TID) | ORAL | Status: DC | PRN
  Administered 2022-10-31 – 2022-11-01 (×2): 25 mg via ORAL
  Filled 2022-10-31 (×2): qty 1

## 2022-10-31 MED ORDER — ACETAMINOPHEN 325 MG PO TABS: 650.0000 mg | ORAL_TABLET | Freq: Four times a day (QID) | ORAL | Status: DC | PRN

## 2022-10-31 MED ORDER — ALUM & MAG HYDROXIDE-SIMETH 200-200-20 MG/5ML PO SUSP: 30.0000 mL | ORAL | Status: DC | PRN

## 2022-10-31 MED ORDER — TRAZODONE HCL 50 MG PO TABS
50.0000 mg | ORAL_TABLET | Freq: Every evening | ORAL | Status: DC | PRN
  Administered 2022-10-31 – 2022-11-01 (×2): 50 mg via ORAL
  Filled 2022-10-31 (×2): qty 1

## 2022-10-31 MED ORDER — IBUPROFEN 400 MG PO TABS: 600.0000 mg | ORAL_TABLET | Freq: Once | ORAL | Status: DC

## 2022-10-31 MED ORDER — MAGNESIUM HYDROXIDE 400 MG/5ML PO SUSP: 30.0000 mL | Freq: Every day | ORAL | Status: DC | PRN

## 2022-10-31 NOTE — ED Notes (Signed)
Tyler White admitted to [facility] voluntary from [facility]. Patient arrived at [facility] via safe transport. Patients presents depressed/sad but calm and cooperative. Denies SI/HI/AVH and pain. Belongings in locker 13. Patient oriented to the unit and acclimating well. Dinner offered/accepted. Safety monitoring will continue by staff.

## 2022-10-31 NOTE — ED Triage Notes (Signed)
Pt. Stated. Im homeless and Im sorted having suicidal thoughts for 3 half years.

## 2022-10-31 NOTE — ED Notes (Signed)
Breakfast tray at bedside 

## 2022-10-31 NOTE — ED Notes (Signed)
Pt declines pain meds at this time

## 2022-10-31 NOTE — Progress Notes (Signed)
Client reported to staff he doesn't know his brother's number.  [name] [facility]

## 2022-10-31 NOTE — ED Notes (Signed)
Pt resting with eyes closed; respirations spontaneous, even, unlabored 

## 2022-10-31 NOTE — ED Notes (Signed)
Negative COVID test,Voluntary Admission and Consent for treatment signed.

## 2022-10-31 NOTE — Progress Notes (Signed)
[facility] called the numbers provided by the providers:  Tyler White lives in [place], phone number [phone]. (Patient's brother).  Phone number is not correct.  The person that answered reports I have the wrong number.  Person that answered was speaking spanish and [place].   Writer called: [phone] [facility]) phone number did not work.  Writer called Mom: Tyler White [phone]).  She didn't answer and the VM was full.  Writer went to see the patient but he was sleep.  Writer asked the nursing staff to let me know when he awakes and or get his brother number so the Writer can make contact to arrange services to get him picked up.     [name]  [facility]

## 2022-10-31 NOTE — ED Notes (Signed)
Safe transport called 

## 2022-10-31 NOTE — Progress Notes (Signed)
Writer went to see the client again but he's still sleeping.  Writer has informed staff if he wakes up to ask for any other contact information/phone number for another relative the Writer can contact to request the information.  [name]  [facility]

## 2022-10-31 NOTE — ED Provider Notes (Signed)
[facility] EMERGENCY DEPARTMENT Provider Note   CSN: [id number] Arrival date & time: 10/31/22  0715     History  Chief Complaint  Patient presents with   Suicidal   Addiction Problem    Tyler White is a 26 y.o. male.  Patient is a 26 year old male with past medical history of alcohol dependence, drug dependence, and schizoaffective disorder presenting for signs of suicidal ideation.  Patient admits to current homelessness.  States he has not eaten in the last 2 to 3 days.  Patient denies medication use at this time.  When asked about auditory or visual hallucination patient states " I can hear rats everywhere".  Patient was nonverbal throughout several other questions.  Patient does admit to suicidal ideation without a plan stating " I have had thoughts of hurting myself for several years".  He denies any inpatient hospitalizations.  He admits to drug and alcohol use in the last 24 hours but does not respond when asked about quantity or drug type.  At this time he admits to right hand pain but denies any trauma or injuries.  The history is provided by the patient. No language interpreter was used.       Home Medications Prior to Admission medications   Medication Sig Start Date End Date Taking? Authorizing Provider  acetaminophen (TYLENOL) 500 MG tablet Take 1,000 mg by mouth every 6 (six) hours as needed for moderate pain.   Yes [provider]  paliperidone (INVEGA SUSTENNA) 234 MG/1.5ML SUSY injection Inject 234 mg into the muscle once for 1 dose. Patient taking differently: Inject 234 mg into the muscle every 30 (thirty) days. 04/16/22 10/31/22 Yes Tharon Aquas, NP  gabapentin (NEURONTIN) 400 MG capsule Take 1 capsule (400 mg total) by mouth 3 (three) times daily. Patient not taking: Reported on 06/09/2021 04/30/21 06/10/21  Ethelene Hal, NP      Allergies    Patient has no known allergies.    Review of Systems   Review of Systems   Constitutional:  Negative for chills and fever.  HENT:  Negative for ear pain and sore throat.   Eyes:  Negative for pain and visual disturbance.  Respiratory:  Negative for cough and shortness of breath.   Cardiovascular:  Negative for chest pain and palpitations.  Gastrointestinal:  Negative for abdominal pain and vomiting.  Genitourinary:  Negative for dysuria and hematuria.  Musculoskeletal:  Negative for arthralgias and back pain.  Skin:  Negative for color change and rash.  Neurological:  Negative for seizures and syncope.  Psychiatric/Behavioral:  Positive for hallucinations and suicidal ideas.   All other systems reviewed and are negative.   Physical Exam Updated Vital Signs BP (!) 124/56   Pulse 71   Temp 98.3 F (36.8 C) (Oral)   Resp 16   Ht '5\' 8"'$  (1.727 m)   Wt 69.4 kg   SpO2 100%   BMI 23.26 kg/m  Physical Exam Vitals and nursing note reviewed.  Constitutional:      General: He is not in acute distress.    Appearance: He is well-developed.  HENT:     Head: Normocephalic and atraumatic.  Eyes:     Conjunctiva/sclera: Conjunctivae normal.  Cardiovascular:     Rate and Rhythm: Normal rate and regular rhythm.     Heart sounds: No murmur heard. Pulmonary:     Effort: Pulmonary effort is normal. No respiratory distress.     Breath sounds: Normal breath sounds.  Abdominal:  Palpations: Abdomen is soft.     Tenderness: There is no abdominal tenderness.  Musculoskeletal:        General: No swelling.     Cervical back: Neck supple.  Skin:    General: Skin is warm and dry.     Capillary Refill: Capillary refill takes less than 2 seconds.  Neurological:     Mental Status: He is alert.  Psychiatric:        Attention and Perception: He perceives auditory hallucinations.        Mood and Affect: Mood normal. Affect is blunt.        Speech: Speech normal.        Behavior: Behavior is withdrawn.        Thought Content: Thought content includes suicidal  ideation.     Comments: Patient admits to history of auditory hallucinations however is not responding internally at this time.  He has normal attention.  His mood is blunted but he is also intermittently asleep during our conversation.     ED Results / Procedures / Treatments   Labs (all labs ordered are listed, but only abnormal results are displayed) Labs Reviewed  RAPID URINE DRUG SCREEN, HOSP PERFORMED - Abnormal; Notable for the following components:      Result Value   Cocaine POSITIVE (*)    Tetrahydrocannabinol POSITIVE (*)    All other components within normal limits  SARS CORONAVIRUS 2 (TAT 6-24 HRS)  ETHANOL    EKG None  Radiology No results found.  Procedures Procedures    Medications Ordered in ED Medications - No data to display   ED Course/ Medical Decision Making/ A&P                           Medical Decision Making Amount and/or Complexity of Data Reviewed Labs: ordered.   68:80 AM 26 year old male with past medical history of alcohol dependence, drug dependence, and schizoaffective disorder presenting for signs of suicidal ideation. Normal speech.  Patient is alert and oriented x3, no acute distress, afebrile, stable vital signs.  Patient admits to right-sided hand pain.  No evidence of trauma on exam.  No swelling, erythema, ecchymosis, or lacerations.  No tenderness to palpation.  Motrin 800 given.  We will hold off on x-rays at this time.  From a psychiatric standpoint patient admits to auditory hallucinations however is not responding internally at this time.  He has normal attention.  His mood appears blunted however he is intermittently falling asleep during our conversation.  States he has not gotten proper sleep in the past several weeks secondary to homelessness.  He was withdrawn and admits to suicidal ideation.  He also admits to alcohol and drug use.  Obtain ethanol and UDS and consult TTS for further psychiatric evaluation.   10:09 AM TTS  has evaluated patient with the further recommendations: This patient can be transferred to [facility] continuous assessment unit. Please call report [phone] or [phone].  Provider has spoke with [facility] and they are accepting patient, Beatriz Stallion FNP, accepting provider. Psychiatry is working with social work to obtain a ticket to get patient back home to [place] were he is originally from.  Currently attempting to locate family and [facility] has agreed to accept patient while we coordinate getting patient home.    Pending covid swab at shift change. Uncoming provider to be notified.        Final Clinical Impression(s) / ED Diagnoses Final diagnoses:  Homelessness  Suicidal ideation    Rx / DC Orders ED Discharge Orders     None         Lianne Cure, DO 56/15/37 1009

## 2022-10-31 NOTE — ED Notes (Signed)
Pt sleeping at present, no distress noted, monitoring for safety. 

## 2022-10-31 NOTE — ED Notes (Signed)
Pt changed into scrubs; belongings collected and placed in locker 2; wanded by security.  Pt calm and cooperative, resting on stretcher in line of sight.

## 2022-10-31 NOTE — ED Notes (Signed)
Psychiatry at bedside.

## 2022-10-31 NOTE — ED Notes (Signed)
Pt eating breakfast 

## 2022-10-31 NOTE — Progress Notes (Signed)
Writer called the client's mother, Tyler White,  she didn't answer.  Writer was unable to leave a VM.   [name] [facility]

## 2022-10-31 NOTE — ED Provider Notes (Signed)
[facility]gent [facility] Continuous Assessment Admission H&P  Date: 10/31/22 Patient Name: Tyler White MRN: [id number] Chief Complaint: No chief complaint on file.  Diagnoses:  Final diagnoses:  Homelessness   HPI:  Pt presents voluntarily to [facility] as a direct transfer from [facility].  Pt is assessed face-to-face by nurse practitioner.   Tyler White, 26 y.o., male patient seen face to face by this provider, and chart reviewed on 10/31/22.  On evaluation Tyler White reports he is presenting to this facility today because "social work getting me a bus pass to my brother". Pt reports his brother's name is Tyler White, is 106 y/o, and living in [place]. Pt does not know his brother's phone number. He reports his brother has a room for him in [place]. He states his brother showed him this room over video when they last spoke. He cannot recall when they last spoke.   Pt reports euthymic mood. He denies mood disturbances, including depression, anxiety, euphoria. He reports good appetite, eating 2 to 3 meals/day, and good sleep, sleeping 6 to 8 hours/night. He denies SI/VI/HI, AVH, paranoia. Pt reports he has been prescribed medications in the past although does not take them as prescribed and has not taken them in a while. He cannot recall what medications he takes or when he last took them. He states he has been stable without medications.  He reports cocaine use every other day, $10 to $20/day. His last use was last night. He used $10 worth.  He reports use of marijuana every day, 2 to 3 blunts/day. He reports last use was last night. He states he used 1 blunt.  He reports use of cigarettes every day, 5 to 6 cigarettes/day. He reports last cigarette was last night. He used 1 cigarette.  Pt reports he is not connected with medication management or counseling. He is not interested in medication management or counseling.   Pt denies hx of SA or NSSIB. He reports hx of  inpatient psychiatric admissions, although cannot recall when his last admission was. Per chart review, pt was admitted to [facility] from 01/29/22-02/08/22.   Pt reports he is currently homeless.   He denies access to a firearm or othe weapon.   Pt reports unknown family psychiatric history.  During evaluation Tyler White is lying down on 2 chairs in the assessment room. He is a&ox3, in no acute distress, non-toxic appearing. He is calm, cooperative, and attentive. His reported mood is euthymic. His affect is flat. There is no evidence of agitation, aggression, or distractibility. There is no evidence he is responding to internal stimuli. Pt is able to converse coherent, goal directed thoughts. He answered questions appropriately.   PHQ 2-9:  Flowsheet Row ED from 09/19/2022 in [place] ED from 06/19/2022 in [facility] ED from 06/05/2022 in [facility]  Thoughts that you would be better off dead, or of hurting yourself in some way Several days More than half the days Nearly every day  PHQ-9 Total Score '15 18 18       '$ Flowsheet Row ED from 10/31/2022 in [facility] Most recent reading at 10/31/2022  5:45 PM ED from 10/31/2022 in [place] Most recent reading at 10/31/2022  7:59 AM ED from 10/27/2022 in [facility] Most recent reading at 10/27/2022  4:01 PM  C-SSRS RISK CATEGORY Error: Q3, 4, or 5 should not be populated when  Q2 is No High Risk Error: Q3, 4, or 5 should not be populated when Q2 is No        Total Time spent with patient: 15 minutes  Musculoskeletal  Strength & Muscle Tone: within normal limits Gait & Station: normal Patient leans: N/A  Psychiatric Specialty Exam  Presentation General Appearance:  Other (comment) (dressed in scrubs)  Eye Contact: Fair  Speech: Clear and  Coherent; Normal Rate  Speech Volume: Normal  Handedness: Right   Mood and Affect  Mood: Euthymic  Affect: Flat   Thought Process  Thought Processes: Coherent; Goal Directed; Linear  Descriptions of Associations:Intact  Orientation:Full (Time, Place and Person)  Thought Content:Logical  Diagnosis of Schizophrenia or Schizoaffective disorder in past: No   Hallucinations:No data recorded Ideas of Reference:None  Suicidal Thoughts:Suicidal Thoughts: No  Homicidal Thoughts:Homicidal Thoughts: No   Sensorium  Memory: Immediate Fair  Judgment: Fair  Insight: Fair   Community education officer  Concentration: Fair  Attention Span: Fair  Recall: [employer] of Knowledge: Fair  Language: Fair   Psychomotor Activity  Psychomotor Activity: Psychomotor Activity: Normal   Assets  Assets: Communication Skills; Desire for Improvement; Financial Resources/Insurance; Resilience   Sleep  Sleep: Sleep: Fair Number of Hours of Sleep: 0 (6 to 8 hours/night)   Nutritional Assessment (For OBS and FBC admissions only) Has the patient had a weight loss or gain of 10 pounds or more in the last 3 months?: No Has the patient had a decrease in food intake/or appetite?: No Does the patient have dental problems?: No Does the patient have eating habits or behaviors that may be indicators of an eating disorder including binging or inducing vomiting?: No Has the patient recently lost weight without trying?: 0 Has the patient been eating poorly because of a decreased appetite?: 0 Malnutrition Screening Tool Score: 0    Physical Exam Constitutional:      General: He is not in acute distress.    Appearance: Normal appearance. He is not ill-appearing, toxic-appearing or diaphoretic.  Eyes:     General:        Right eye: No discharge.        Left eye: No discharge.  Cardiovascular:     Rate and Rhythm: Normal rate.  Pulmonary:     Effort: Pulmonary effort is  normal. No respiratory distress.  Neurological:     Mental Status: He is alert and oriented to person, place, and time.  Psychiatric:        Attention and Perception: Attention and perception normal.        Mood and Affect: Mood normal. Affect is flat.        Speech: Speech normal.        Behavior: Behavior normal. Behavior is cooperative.        Thought Content: Thought content normal.        Cognition and Memory: Cognition and memory normal.    Review of Systems  Constitutional:  Negative for chills and fever.  Respiratory:  Negative for shortness of breath.   Cardiovascular:  Negative for chest pain and palpitations.  Gastrointestinal:  Negative for abdominal pain, constipation, diarrhea, nausea and vomiting.  Neurological:  Negative for dizziness and headaches.  Psychiatric/Behavioral:  Positive for substance abuse.     SpO2 100 %. There is no height or weight on file to calculate BMI.  Past Psychiatric History: Per chart review, hx of schizophrenia, suicidal ideation,  cocaine abuse, substance induced mood disorder, polysubstance abuse  Is the patient at risk to self? No  Has the patient been a risk to self in the past 6 months? No .    Has the patient been a risk to self within the distant past? No   Is the patient a risk to others? No   Has the patient been a risk to others in the past 6 months? No   Has the patient been a risk to others within the distant past? No   Past Medical History:  Past Medical History:  Diagnosis Date   Hernia, inguinal, right    Inguinal hernia    right   Psychiatric illness    Schizophrenia ([place])    Suicidal ideation    No past surgical history on file.  Family History:  Family History  Problem Relation Age of Onset   Psychiatric Illness Mother    Hypertension Sister     Social History:  Social History   Socioeconomic History   Marital status: Single    Spouse name: Not on file   Number of children: Not on file   Years of  education: 10   Highest education level: Not on file  Occupational History   Not on file  Tobacco Use   Smoking status: Every Day    Packs/day: 0.50    Years: 5.00    Total pack years: 2.50    Types: Cigarettes   Smokeless tobacco: Never  Vaping Use   Vaping Use: Never used  Substance and Sexual Activity   Alcohol use: Not Currently   Drug use: Not Currently    Types: Marijuana, Cocaine, "Crack" cocaine   Sexual activity: Yes    Birth control/protection: None  Other Topics Concern   Not on file  Social History Narrative   ** Merged History Encounter **       ** Merged History Encounter **       ** Merged History Encounter **       Social Determinants of Health   Financial Resource Strain: Not on file  Food Insecurity: Not on file  Transportation Needs: Not on file  Physical Activity: Not on file  Stress: Not on file  Social Connections: Not on file  Intimate Partner Violence: Not on file    SDOH:  SDOH Screenings   Alcohol Screen: Low Risk  (01/29/2022)  Depression (PHQ2-9): High Risk (09/19/2022)  Tobacco Use: High Risk (10/31/2022)    Last Labs:  Admission on 10/31/2022, Discharged on 10/31/2022  Component Date Value Ref Range Status   Alcohol, Ethyl (B) 10/31/2022 <10  <10 mg/dL Final   Comment: (NOTE) Lowest detectable limit for serum alcohol is 10 mg/dL.  For medical purposes only. Performed at [facility], [place] [address], [place], [place] [address]    Opiates 10/31/2022 NONE DETECTED  NONE DETECTED Final   Cocaine 10/31/2022 POSITIVE (A)  NONE DETECTED Final   Benzodiazepines 10/31/2022 NONE DETECTED  NONE DETECTED Final   Amphetamines 10/31/2022 NONE DETECTED  NONE DETECTED Final   Tetrahydrocannabinol 10/31/2022 POSITIVE (A)  NONE DETECTED Final   Barbiturates 10/31/2022 NONE DETECTED  NONE DETECTED Final   Comment: (NOTE) DRUG SCREEN FOR MEDICAL PURPOSES ONLY.  IF CONFIRMATION IS NEEDED FOR ANY PURPOSE, NOTIFY LAB WITHIN 5  DAYS.  LOWEST DETECTABLE LIMITS FOR URINE DRUG SCREEN Drug Class                     Cutoff (ng/mL) Amphetamine and metabolites    1000 Barbiturate and  metabolites    200 Benzodiazepine                 200 Opiates and metabolites        300 Cocaine and metabolites        300 THC                            50 Performed at [facility], [place] [address], [place], [place] [address]   Admission on 10/17/2022, Discharged on 10/17/2022  Component Date Value Ref Range Status   Color, Urine 10/17/2022 AMBER (A)  YELLOW Final   BIOCHEMICALS MAY BE AFFECTED BY COLOR   APPearance 10/17/2022 HAZY (A)  CLEAR Final   Specific Gravity, Urine 10/17/2022 1.030  1.005 - 1.030 Final   pH 10/17/2022 5.0  5.0 - 8.0 Final   Glucose, UA 10/17/2022 NEGATIVE  NEGATIVE mg/dL Final   Hgb urine dipstick 10/17/2022 NEGATIVE  NEGATIVE Final   Bilirubin Urine 10/17/2022 NEGATIVE  NEGATIVE Final   Ketones, ur 10/17/2022 NEGATIVE  NEGATIVE mg/dL Final   Protein, ur 10/17/2022 30 (A)  NEGATIVE mg/dL Final   Nitrite 10/17/2022 NEGATIVE  NEGATIVE Final   Leukocytes,Ua 10/17/2022 LARGE (A)  NEGATIVE Final   RBC / HPF 10/17/2022 0-5  0 - 5 RBC/hpf Final   WBC, UA 10/17/2022 >50 (H)  0 - 5 WBC/hpf Final   Bacteria, UA 10/17/2022 RARE (A)  NONE SEEN Final   Squamous Epithelial / LPF 10/17/2022 0-5  0 - 5 Final   Mucus 10/17/2022 PRESENT   Final   Performed at [facility], [place] [address], [place], [place] [address]  Admission on 10/12/2022, Discharged on 10/13/2022  Component Date Value Ref Range Status   Glucose-Capillary 10/12/2022 115 (H)  70 - 99 mg/dL Final   Glucose reference range applies only to samples taken after fasting for at least 8 hours.   Sodium 10/12/2022 136  135 - 145 mmol/L Final   Potassium 10/12/2022 4.0  3.5 - 5.1 mmol/L Final   Chloride 10/12/2022 104  98 - 111 mmol/L Final   CO2 10/12/2022 22  22 - 32 mmol/L Final   Glucose, Bld 10/12/2022 105 (H)  70 - 99 mg/dL Final   Glucose  reference range applies only to samples taken after fasting for at least 8 hours.   BUN 10/12/2022 20  6 - 20 mg/dL Final   Creatinine, Ser 10/12/2022 0.99  0.61 - 1.24 mg/dL Final   Calcium 10/12/2022 9.3  8.9 - 10.3 mg/dL Final   GFR, Estimated 10/12/2022 >60  >60 mL/min Final   Comment: (NOTE) Calculated using the CKD-EPI Creatinine Equation (2021)    Anion gap 10/12/2022 10  5 - 15 Final   Performed at [facility], [place] [address] [place], [place] [address]   WBC 10/12/2022 9.5  4.0 - 10.5 K/uL Final   RBC 10/12/2022 4.48  4.22 - 5.81 MIL/uL Final   Hemoglobin 10/12/2022 12.4 (L)  13.0 - 17.0 g/dL Final   HCT 10/12/2022 37.4 (L)  39.0 - 52.0 % Final   MCV 10/12/2022 83.5  80.0 - 100.0 fL Final   MCH 10/12/2022 27.7  26.0 - 34.0 pg Final   MCHC 10/12/2022 33.2  30.0 - 36.0 g/dL Final   RDW 10/12/2022 13.3  11.5 - 15.5 % Final   Platelets 10/12/2022 319  150 - 400 K/uL Final   nRBC 10/12/2022 0.0  0.0 -  0.2 % Final   Performed at [facility], [place] [address], [place], [place] [address]  Admission on 10/11/2022, Discharged on 10/11/2022  Component Date Value Ref Range Status   SARS Coronavirus 2 by RT PCR 10/11/2022 NEGATIVE  NEGATIVE Final   Comment: (NOTE) SARS-CoV-2 target nucleic acids are NOT DETECTED.  The SARS-CoV-2 RNA is generally detectable in upper respiratory specimens during the acute phase of infection. The lowest concentration of SARS-CoV-2 viral copies this assay can detect is 138 copies/mL. A negative result does not preclude SARS-Cov-2 infection and should not be used as the sole basis for treatment or other patient management decisions. A negative result may occur with  improper specimen collection/handling, submission of specimen other than nasopharyngeal swab, presence of viral mutation(s) within the areas targeted by this assay, and inadequate number of viral copies(<138 copies/mL). A negative result must be combined with clinical observations,  patient history, and epidemiological information. The expected result is Negative.  Fact Sheet for Patients:  EntrepreneurPulse.com.au  Fact Sheet for Healthcare Providers:  IncredibleEmployment.be  This test is no                          t yet approved or cleared by the [place] FDA and  has been authorized for detection and/or diagnosis of SARS-CoV-2 by FDA under an Emergency Use Authorization (EUA). This EUA will remain  in effect (meaning this test can be used) for the duration of the COVID-19 declaration under Section 564(b)(1) of the Act, 21 U.S.C.section 360bbb-3(b)(1), unless the authorization is terminated  or revoked sooner.       Influenza A by PCR 10/11/2022 NEGATIVE  NEGATIVE Final   Influenza B by PCR 10/11/2022 NEGATIVE  NEGATIVE Final   Comment: (NOTE) The Xpert Xpress SARS-CoV-2/FLU/RSV plus assay is intended as an aid in the diagnosis of influenza from Nasopharyngeal swab specimens and should not be used as a sole basis for treatment. Nasal washings and aspirates are unacceptable for Xpert Xpress SARS-CoV-2/FLU/RSV testing.  Fact Sheet for Patients: EntrepreneurPulse.com.au  Fact Sheet for Healthcare Providers: IncredibleEmployment.be  This test is not yet approved or cleared by the [place] FDA and has been authorized for detection and/or diagnosis of SARS-CoV-2 by FDA under an Emergency Use Authorization (EUA). This EUA will remain in effect (meaning this test can be used) for the duration of the COVID-19 declaration under Section 564(b)(1) of the Act, 21 U.S.C. section 360bbb-3(b)(1), unless the authorization is terminated or revoked.  Performed at [facility], [place] [address] [place], [place] [address]   Admission on 10/09/2022, Discharged on 10/09/2022  Component Date Value Ref Range Status   Sodium 10/09/2022 135  135 - 145 mmol/L Final   Potassium  10/09/2022 3.6  3.5 - 5.1 mmol/L Final   Chloride 10/09/2022 104  98 - 111 mmol/L Final   CO2 10/09/2022 24  22 - 32 mmol/L Final   Glucose, Bld 10/09/2022 112 (H)  70 - 99 mg/dL Final   Glucose reference range applies only to samples taken after fasting for at least 8 hours.   BUN 10/09/2022 18  6 - 20 mg/dL Final   Creatinine, Ser 10/09/2022 0.92  0.61 - 1.24 mg/dL Final   Calcium 10/09/2022 8.9  8.9 - 10.3 mg/dL Final   Total Protein 10/09/2022 6.9  6.5 - 8.1 g/dL Final   Albumin 10/09/2022 3.6  3.5 - 5.0 g/dL Final   AST 10/09/2022 36  15 -  41 U/L Final   ALT 10/09/2022 25  0 - 44 U/L Final   Alkaline Phosphatase 10/09/2022 53  38 - 126 U/L Final   Total Bilirubin 10/09/2022 0.6  0.3 - 1.2 mg/dL Final   GFR, Estimated 10/09/2022 >60  >60 mL/min Final   Comment: (NOTE) Calculated using the CKD-EPI Creatinine Equation (2021)    Anion gap 10/09/2022 7  5 - 15 Final   Performed at [facility], [place] [address] [place], [place] [address]   Alcohol, Ethyl (B) 10/09/2022 <10  <10 mg/dL Final   Comment: (NOTE) Lowest detectable limit for serum alcohol is 10 mg/dL.  For medical purposes only. Performed at [facility], [place] [address] [place], [place] [address]    Salicylate Lvl 21/19/4174 <7.0 (L)  7.0 - 30.0 mg/dL Final   Performed at [place] [address], [place], [place] [address]   Acetaminophen (Tylenol), Serum 10/09/2022 <10 (L)  10 - 30 ug/mL Final   Comment: (NOTE) Therapeutic concentrations vary significantly. A range of 10-30 ug/mL  may be an effective concentration for many patients. However, some  are best treated at concentrations outside of this range. Acetaminophen concentrations >150 ug/mL at 4 hours after ingestion  and >50 ug/mL at 12 hours after ingestion are often associated with  toxic reactions.  Performed at [facility], [place] [address], [place], [place] [address]    WBC 10/09/2022 7.2  4.0 - 10.5 K/uL Final   RBC  10/09/2022 4.69  4.22 - 5.81 MIL/uL Final   Hemoglobin 10/09/2022 13.2  13.0 - 17.0 g/dL Final   HCT 10/09/2022 37.9 (L)  39.0 - 52.0 % Final   MCV 10/09/2022 80.8  80.0 - 100.0 fL Final   MCH 10/09/2022 28.1  26.0 - 34.0 pg Final   MCHC 10/09/2022 34.8  30.0 - 36.0 g/dL Final   RDW 10/09/2022 13.6  11.5 - 15.5 % Final   Platelets 10/09/2022 362  150 - 400 K/uL Final   nRBC 10/09/2022 0.0  0.0 - 0.2 % Final   Performed at [place] [address], [place], [place] [address]   Opiates 10/09/2022 NONE DETECTED  NONE DETECTED Final   Cocaine 10/09/2022 POSITIVE (A)  NONE DETECTED Final   Benzodiazepines 10/09/2022 NONE DETECTED  NONE DETECTED Final   Amphetamines 10/09/2022 NONE DETECTED  NONE DETECTED Final   Tetrahydrocannabinol 10/09/2022 POSITIVE (A)  NONE DETECTED Final   Barbiturates 10/09/2022 NONE DETECTED  NONE DETECTED Final   Comment: (NOTE) DRUG SCREEN FOR MEDICAL PURPOSES ONLY.  IF CONFIRMATION IS NEEDED FOR ANY PURPOSE, NOTIFY LAB WITHIN 5 DAYS.  LOWEST DETECTABLE LIMITS FOR URINE DRUG SCREEN Drug Class                     Cutoff (ng/mL) Amphetamine and metabolites    1000 Barbiturate and metabolites    200 Benzodiazepine                 200 Opiates and metabolites        300 Cocaine and metabolites        300 THC                            50 Performed at [facility], [place] [address], [place], [place] [address]   Admission on 10/03/2022, Discharged on 10/03/2022  Component Date Value Ref Range Status  SARS Coronavirus 2 by RT PCR 10/03/2022 NEGATIVE  NEGATIVE Final   Comment: (NOTE) SARS-CoV-2 target nucleic acids are NOT DETECTED.  The SARS-CoV-2 RNA is generally detectable in upper respiratory specimens during the acute phase of infection. The lowest concentration of SARS-CoV-2 viral copies this assay can detect is 138 copies/mL. A negative result does not preclude SARS-Cov-2 infection and should not be used as the sole basis for treatment  or other patient management decisions. A negative result may occur with  improper specimen collection/handling, submission of specimen other than nasopharyngeal swab, presence of viral mutation(s) within the areas targeted by this assay, and inadequate number of viral copies(<138 copies/mL). A negative result must be combined with clinical observations, patient history, and epidemiological information. The expected result is Negative.  Fact Sheet for Patients:  EntrepreneurPulse.com.au  Fact Sheet for Healthcare Providers:  IncredibleEmployment.be  This test is no                          t yet approved or cleared by the [place] FDA and  has been authorized for detection and/or diagnosis of SARS-CoV-2 by FDA under an Emergency Use Authorization (EUA). This EUA will remain  in effect (meaning this test can be used) for the duration of the COVID-19 declaration under Section 564(b)(1) of the Act, 21 U.S.C.section 360bbb-3(b)(1), unless the authorization is terminated  or revoked sooner.       Influenza A by PCR 10/03/2022 NEGATIVE  NEGATIVE Final   Influenza B by PCR 10/03/2022 NEGATIVE  NEGATIVE Final   Comment: (NOTE) The Xpert Xpress SARS-CoV-2/FLU/RSV plus assay is intended as an aid in the diagnosis of influenza from Nasopharyngeal swab specimens and should not be used as a sole basis for treatment. Nasal washings and aspirates are unacceptable for Xpert Xpress SARS-CoV-2/FLU/RSV testing.  Fact Sheet for Patients: EntrepreneurPulse.com.au  Fact Sheet for Healthcare Providers: IncredibleEmployment.be  This test is not yet approved or cleared by the [place] FDA and has been authorized for detection and/or diagnosis of SARS-CoV-2 by FDA under an Emergency Use Authorization (EUA). This EUA will remain in effect (meaning this test can be used) for the duration of the COVID-19 declaration under  Section 564(b)(1) of the Act, 21 U.S.C. section 360bbb-3(b)(1), unless the authorization is terminated or revoked.  Performed at [facility], [place] [address] [place], [place] [address]    WBC 10/03/2022 6.5  4.0 - 10.5 K/uL Final   RBC 10/03/2022 4.65  4.22 - 5.81 MIL/uL Final   Hemoglobin 10/03/2022 12.9 (L)  13.0 - 17.0 g/dL Final   HCT 10/03/2022 38.5 (L)  39.0 - 52.0 % Final   MCV 10/03/2022 82.8  80.0 - 100.0 fL Final   MCH 10/03/2022 27.7  26.0 - 34.0 pg Final   MCHC 10/03/2022 33.5  30.0 - 36.0 g/dL Final   RDW 10/03/2022 13.6  11.5 - 15.5 % Final   Platelets 10/03/2022 368  150 - 400 K/uL Final   nRBC 10/03/2022 0.0  0.0 - 0.2 % Final   Neutrophils Relative % 10/03/2022 65  % Final   Neutro Abs 10/03/2022 4.3  1.7 - 7.7 K/uL Final   Lymphocytes Relative 10/03/2022 22  % Final   Lymphs Abs 10/03/2022 1.4  0.7 - 4.0 K/uL Final   Monocytes Relative 10/03/2022 11  % Final   Monocytes Absolute 10/03/2022 0.7  0.1 - 1.0 K/uL Final   Eosinophils Relative 10/03/2022 1  % Final  Eosinophils Absolute 10/03/2022 0.1  0.0 - 0.5 K/uL Final   Basophils Relative 10/03/2022 1  % Final   Basophils Absolute 10/03/2022 0.0  0.0 - 0.1 K/uL Final   Immature Granulocytes 10/03/2022 0  % Final   Abs Immature Granulocytes 10/03/2022 0.01  0.00 - 0.07 K/uL Final   Performed at [facility], [place] [address], [place], [place] [address]   Sodium 10/03/2022 136  135 - 145 mmol/L Final   Potassium 10/03/2022 3.5  3.5 - 5.1 mmol/L Final   Chloride 10/03/2022 102  98 - 111 mmol/L Final   CO2 10/03/2022 22  22 - 32 mmol/L Final   Glucose, Bld 10/03/2022 96  70 - 99 mg/dL Final   Glucose reference range applies only to samples taken after fasting for at least 8 hours.   BUN 10/03/2022 17  6 - 20 mg/dL Final   Creatinine, Ser 10/03/2022 1.10  0.61 - 1.24 mg/dL Final   Calcium 10/03/2022 9.2  8.9 - 10.3 mg/dL Final   Total Protein 10/03/2022 7.2  6.5 - 8.1 g/dL Final   Albumin 10/03/2022 3.7   3.5 - 5.0 g/dL Final   AST 10/03/2022 42 (H)  15 - 41 U/L Final   ALT 10/03/2022 27  0 - 44 U/L Final   Alkaline Phosphatase 10/03/2022 49  38 - 126 U/L Final   Total Bilirubin 10/03/2022 0.4  0.3 - 1.2 mg/dL Final   GFR, Estimated 10/03/2022 >60  >60 mL/min Final   Comment: (NOTE) Calculated using the CKD-EPI Creatinine Equation (2021)    Anion gap 10/03/2022 12  5 - 15 Final   Performed at [place] [address], [place], [place] [address]   TSH 10/03/2022 1.945  0.350 - 4.500 uIU/mL Final   Comment: Performed by a 3rd Generation assay with a functional sensitivity of <=0.01 uIU/mL. Performed at [facility], [place] [address] [place], [place] [address]    SARSCOV2ONAVIRUS 2 AG 10/03/2022 NEGATIVE  NEGATIVE Final   Comment: (NOTE) SARS-CoV-2 antigen NOT DETECTED.   Negative results are presumptive.  Negative results do not preclude SARS-CoV-2 infection and should not be used as the sole basis for treatment or other patient management decisions, including infection  control decisions, particularly in the presence of clinical signs and  symptoms consistent with COVID-19, or in those who have been in contact with the virus.  Negative results must be combined with clinical observations, patient history, and epidemiological information. The expected result is Negative.  Fact Sheet for Patients: HandmadeRecipes.com.cy  Fact Sheet for Healthcare Providers: FuneralLife.at  This test is not yet approved or cleared by the [place] FDA and  has been authorized for detection and/or diagnosis of SARS-CoV-2 by FDA under an Emergency Use Authorization (EUA).  This EUA will remain in effect (meaning this test can be used) for the duration of  the COV                          ID-19 declaration under Section 564(b)(1) of the Act, 21 U.S.C. section 360bbb-3(b)(1), unless the authorization is terminated or revoked sooner.     Admission on 10/02/2022, Discharged on 10/02/2022  Component Date Value Ref Range Status   SARS Coronavirus 2 by RT PCR 10/02/2022 NEGATIVE  NEGATIVE Final   Comment: (NOTE) SARS-CoV-2 target nucleic acids are NOT DETECTED.  The SARS-CoV-2 RNA is generally detectable in upper and lower respiratory specimens during the acute phase  of infection. The lowest concentration of SARS-CoV-2 viral copies this assay can detect is 250 copies / mL. A negative result does not preclude SARS-CoV-2 infection and should not be used as the sole basis for treatment or other patient management decisions.  A negative result may occur with improper specimen collection / handling, submission of specimen other than nasopharyngeal swab, presence of viral mutation(s) within the areas targeted by this assay, and inadequate number of viral copies (<250 copies / mL). A negative result must be combined with clinical observations, patient history, and epidemiological information.  Fact Sheet for Patients:   https://www.patel.info/  Fact Sheet for Healthcare Providers: https://hall.com/  This test is not yet approved or                           cleared by the [place] FDA and has been authorized for detection and/or diagnosis of SARS-CoV-2 by FDA under an Emergency Use Authorization (EUA).  This EUA will remain in effect (meaning this test can be used) for the duration of the COVID-19 declaration under Section 564(b)(1) of the Act, 21 U.S.C. section 360bbb-3(b)(1), unless the authorization is terminated or revoked sooner.  Performed at [facility], [place] [address] [place], [place] [address]   Admission on 09/19/2022, Discharged on 09/19/2022  Component Date Value Ref Range Status   Sodium 09/19/2022 137  135 - 145 mmol/L Final   Potassium 09/19/2022 3.5  3.5 - 5.1 mmol/L Final   Chloride 09/19/2022 103  98 - 111 mmol/L Final   CO2 09/19/2022 25  22 -  32 mmol/L Final   Glucose, Bld 09/19/2022 83  70 - 99 mg/dL Final   Glucose reference range applies only to samples taken after fasting for at least 8 hours.   BUN 09/19/2022 12  6 - 20 mg/dL Final   Creatinine, Ser 09/19/2022 0.96  0.61 - 1.24 mg/dL Final   Calcium 09/19/2022 9.6  8.9 - 10.3 mg/dL Final   Total Protein 09/19/2022 7.6  6.5 - 8.1 g/dL Final   Albumin 09/19/2022 3.7  3.5 - 5.0 g/dL Final   AST 09/19/2022 32  15 - 41 U/L Final   ALT 09/19/2022 29  0 - 44 U/L Final   Alkaline Phosphatase 09/19/2022 57  38 - 126 U/L Final   Total Bilirubin 09/19/2022 0.3  0.3 - 1.2 mg/dL Final   GFR, Estimated 09/19/2022 >60  >60 mL/min Final   Comment: (NOTE) Calculated using the CKD-EPI Creatinine Equation (2021)    Anion gap 09/19/2022 9  5 - 15 Final   Performed at [facility] [address], [place], [place] [address]   Alcohol, Ethyl (B) 09/19/2022 <10  <10 mg/dL Final   Comment: (NOTE) Lowest detectable limit for serum alcohol is 10 mg/dL.  For medical purposes only. Performed at [facility], [place] [address] [place], [place] [address]    WBC 09/19/2022 10.4  4.0 - 10.5 K/uL Final   RBC 09/19/2022 4.55  4.22 - 5.81 MIL/uL Final   Hemoglobin 09/19/2022 12.6 (L)  13.0 - 17.0 g/dL Final   HCT 09/19/2022 38.3 (L)  39.0 - 52.0 % Final   MCV 09/19/2022 84.2  80.0 - 100.0 fL Final   MCH 09/19/2022 27.7  26.0 - 34.0 pg Final   MCHC 09/19/2022 32.9  30.0 - 36.0 g/dL Final   RDW 09/19/2022 13.6  11.5 - 15.5 % Final   Platelets 09/19/2022 420 (H)  150 - 400 K/uL Final   nRBC 09/19/2022 0.0  0.0 - 0.2 % Final   Performed at [facility], [place] [address], [place], [place] [address]   Opiates 09/19/2022 NONE DETECTED  NONE DETECTED Final   Cocaine 09/19/2022 POSITIVE (A)  NONE DETECTED Final   Benzodiazepines 09/19/2022 NONE DETECTED  NONE DETECTED Final   Amphetamines 09/19/2022 NONE DETECTED  NONE DETECTED Final   Tetrahydrocannabinol 09/19/2022 POSITIVE (A)  NONE  DETECTED Final   Barbiturates 09/19/2022 NONE DETECTED  NONE DETECTED Final   Comment: (NOTE) DRUG SCREEN FOR MEDICAL PURPOSES ONLY.  IF CONFIRMATION IS NEEDED FOR ANY PURPOSE, NOTIFY LAB WITHIN 5 DAYS.  LOWEST DETECTABLE LIMITS FOR URINE DRUG SCREEN Drug Class                     Cutoff (ng/mL) Amphetamine and metabolites    1000 Barbiturate and metabolites    200 Benzodiazepine                 191 Tricyclics and metabolites     300 Opiates and metabolites        300 Cocaine and metabolites        300 THC                            50 Performed at [facility], [place] [address], [place], [place] [address]    Salicylate Lvl 56/21/3086 <7.0 (L)  7.0 - 30.0 mg/dL Final   Performed at [facility] [address] [place], [place] [address]   Acetaminophen (Tylenol), Serum 09/19/2022 <10 (L)  10 - 30 ug/mL Final   Comment: (NOTE) Therapeutic concentrations vary significantly. A range of 10-30 ug/mL  may be an effective concentration for many patients. However, some  are best treated at concentrations outside of this range. Acetaminophen concentrations >150 ug/mL at 4 hours after ingestion  and >50 ug/mL at 12 hours after ingestion are often associated with  toxic reactions.  Performed at [facility], [place] [address] [place], [place] [address]    SARS Coronavirus 2 by RT PCR 09/19/2022 NEGATIVE  NEGATIVE Final   Comment: (NOTE) SARS-CoV-2 target nucleic acids are NOT DETECTED.  The SARS-CoV-2 RNA is generally detectable in upper respiratory specimens during the acute phase of infection. The lowest concentration of SARS-CoV-2 viral copies this assay can detect is 138 copies/mL. A negative result does not preclude SARS-Cov-2 infection and should not be used as the sole basis for treatment or other patient management decisions. A negative result may occur with  improper specimen collection/handling, submission of specimen other than nasopharyngeal swab,  presence of viral mutation(s) within the areas targeted by this assay, and inadequate number of viral copies(<138 copies/mL). A negative result must be combined with clinical observations, patient history, and epidemiological information. The expected result is Negative.  Fact Sheet for Patients:  EntrepreneurPulse.com.au  Fact Sheet for Healthcare Providers:  IncredibleEmployment.be  This test is no                          t yet approved or cleared by the [place] FDA and  has been authorized for detection and/or diagnosis of SARS-CoV-2 by FDA under an Emergency Use Authorization (EUA). This EUA will remain  in effect (meaning this test can be used) for the duration of the COVID-19 declaration under Section 564(b)(1) of  the Act, 21 U.S.C.section 360bbb-3(b)(1), unless the authorization is terminated  or revoked sooner.       Influenza A by PCR 09/19/2022 NEGATIVE  NEGATIVE Final   Influenza B by PCR 09/19/2022 NEGATIVE  NEGATIVE Final   Comment: (NOTE) The Xpert Xpress SARS-CoV-2/FLU/RSV plus assay is intended as an aid in the diagnosis of influenza from Nasopharyngeal swab specimens and should not be used as a sole basis for treatment. Nasal washings and aspirates are unacceptable for Xpert Xpress SARS-CoV-2/FLU/RSV testing.  Fact Sheet for Patients: EntrepreneurPulse.com.au  Fact Sheet for Healthcare Providers: IncredibleEmployment.be  This test is not yet approved or cleared by the [place] FDA and has been authorized for detection and/or diagnosis of SARS-CoV-2 by FDA under an Emergency Use Authorization (EUA). This EUA will remain in effect (meaning this test can be used) for the duration of the COVID-19 declaration under Section 564(b)(1) of the Act, 21 U.S.C. section 360bbb-3(b)(1), unless the authorization is terminated or revoked.  Performed at [facility], [place] [address] [place], [place] [address]   Admission on 09/11/2022, Discharged on 09/11/2022  Component Date Value Ref Range Status   T Pallidum Abs 09/09/2022 Reactive (A)  Non Reactive Final   Comment: (NOTE) Performed At: [facility] [place], [place] [id number] Rush Farmer MD LE:[id number]   Admission on 09/10/2022, Discharged on 09/10/2022  Component Date Value Ref Range Status   Sodium 09/10/2022 141  135 - 145 mmol/L Final   Potassium 09/10/2022 3.5  3.5 - 5.1 mmol/L Final   Chloride 09/10/2022 103  98 - 111 mmol/L Final   CO2 09/10/2022 18 (L)  22 - 32 mmol/L Final   Glucose, Bld 09/10/2022 239 (H)  70 - 99 mg/dL Final   Glucose reference range applies only to samples taken after fasting for at least 8 hours.   BUN 09/10/2022 14  6 - 20 mg/dL Final   Creatinine, Ser 09/10/2022 1.35 (H)  0.61 - 1.24 mg/dL Final   Calcium 09/10/2022 9.7  8.9 - 10.3 mg/dL Final   Total Protein 09/10/2022 8.1  6.5 - 8.1 g/dL Final   Albumin 09/10/2022 4.3  3.5 - 5.0 g/dL Final   AST 09/10/2022 42 (H)  15 - 41 U/L Final   ALT 09/10/2022 21  0 - 44 U/L Final   Alkaline Phosphatase 09/10/2022 60  38 - 126 U/L Final   Total Bilirubin 09/10/2022 0.3  0.3 - 1.2 mg/dL Final   GFR, Estimated 09/10/2022 >60  >60 mL/min Final   Comment: (NOTE) Calculated using the CKD-EPI Creatinine Equation (2021)    Anion gap 09/10/2022 20 (H)  5 - 15 Final   Performed at [facility] [address], [place], [place] [address]   WBC 09/10/2022 21.9 (H)  4.0 - 10.5 K/uL Final   RBC 09/10/2022 4.91  4.22 - 5.81 MIL/uL Final   Hemoglobin 09/10/2022 13.8  13.0 - 17.0 g/dL Final   HCT 09/10/2022 42.0  39.0 - 52.0 % Final   MCV 09/10/2022 85.5  80.0 - 100.0 fL Final   MCH 09/10/2022 28.1  26.0 - 34.0 pg Final   MCHC 09/10/2022 32.9  30.0 - 36.0 g/dL Final   RDW 09/10/2022 13.6  11.5 - 15.5 % Final   Platelets 09/10/2022 348  150 - 400 K/uL Final   REPEATED TO VERIFY   nRBC 09/10/2022 0.0  0.0 - 0.2  % Final   Neutrophils Relative % 09/10/2022 88  % Final  Neutro Abs 09/10/2022 19.1 (H)  1.7 - 7.7 K/uL Final   Lymphocytes Relative 09/10/2022 6  % Final   Lymphs Abs 09/10/2022 1.4  0.7 - 4.0 K/uL Final   Monocytes Relative 09/10/2022 5  % Final   Monocytes Absolute 09/10/2022 1.2 (H)  0.1 - 1.0 K/uL Final   Eosinophils Relative 09/10/2022 0  % Final   Eosinophils Absolute 09/10/2022 0.0  0.0 - 0.5 K/uL Final   Basophils Relative 09/10/2022 0  % Final   Basophils Absolute 09/10/2022 0.1  0.0 - 0.1 K/uL Final   Immature Granulocytes 09/10/2022 1  % Final   Abs Immature Granulocytes 09/10/2022 0.16 (H)  0.00 - 0.07 K/uL Final   Performed at [facility] [address], [place], [place] [address]  There may be more visits with results that are not included.    Allergies: Patient has no known allergies.  PTA Medications: (Not in a hospital admission)   Medical Decision Making  Pt is voluntary Admit to obs, direct transfer from [facility]  Recommendations  Based on my evaluation the patient does not appear to have an emergency medical condition.  Tharon Aquas, NP 10/31/22  5:49 PM

## 2022-10-31 NOTE — ED Notes (Signed)
Breakfast tray ordered 

## 2022-10-31 NOTE — ED Notes (Signed)
TC to [facility] report given to ERIC. Eric aware SW is in process of getting a BUS ticket for PT to go to his Brother's home.

## 2022-10-31 NOTE — Consult Note (Signed)
[facility] ED ASSESSMENT   Reason for Consult:  Psychiatric Evaluation  Referring Physician:   Patient Identification: Tyler White MRN:  [id number] ED Chief Complaint: <principal problem not specified>  Diagnosis:  Active Problems:   * No active hospital problems. *   ED Assessment Time Calculation: Start Time: 1100 Stop Time: 1130 Total Time in Minutes (Assessment Completion): 30   Subjective:   Tyler White is a 26 y.o. male, history of schizophrenia, homelessness and substance use patient [facility] 11 01/2022, patient reports he came to the ED and complained of SI due to being cold outside.  Patient  HPI:  Tyler White 26 year old male, seen face to face at [facility] per TTS consult for psychiatric evaluation.  Patient reported due to following temperatures outdoors he needed somewhere to stay to remain warm therefore reported SI upon presenting to the ED.  Patient endorses a history of substance use.  Patient uses cocaine and smokes marijuana.  Reports drinks alcohol if he is able to get access to it.  Reports last substance use was yesterday evening cocaine and earlier in the day yesterday he smoked marijuana.  He denies any active withdrawal symptoms.  Discussed with patient returning home to [place] as a member of the psychiatric team had spoken with brother previously who had stated that he was more than willing to take care of his brother however he lacked the financial means to be able to pay for him to return back to [place].  Discussed with TOC-social work Librarian, academic and was advised that if patient is willing to return home that we could assist with securing a bus ticket.  However, collateral from family, specifically the brother, Tyler White, verified, in that patient would able to be picked up from the bus station and that brother would be able to care for patient in home.   Attempted multiple times throughout the shift to reach [facility] via phone  number provider by colleague [phone], brother of Tyler White.  No voicemail set up therefore unable to leave a HIPAA compliant message. Family Contacts Listed :  Older Brother, Tyler White lives in [place], phone number [phone].  Mother, Tyler White, [phone], person answered indicated incorrect phone number.  [phone] (phone number is not in service).  [place] Scale:  [facility] from 10/31/2022 in [facility] Most recent reading at 10/31/2022  5:45 PM ED from 10/31/2022 in [facility] Most recent reading at 10/31/2022  7:59 AM ED from 10/27/2022 in [facility] Most recent reading at 10/27/2022  4:01 PM  C-SSRS RISK CATEGORY Error: Q3, 4, or 5 should not be populated when Q2 is No High Risk Error: Q3, 4, or 5 should not be populated when Q2 is No      Past Medical History:  Past Medical History:  Diagnosis Date   Hernia, inguinal, right    Inguinal hernia    right   Psychiatric illness    Schizophrenia ([place])    Suicidal ideation    No past surgical history on file. Family History:  Family History  Problem Relation Age of Onset   Psychiatric Illness Mother    Hypertension Sister     Social History:  Social History   Substance and Sexual Activity  Alcohol Use Not Currently     Social History   Substance and Sexual Activity  Drug Use Not Currently   Types: Marijuana, Cocaine, "Crack" cocaine  Social History   Socioeconomic History   Marital status: Single    Spouse name: Not on file   Number of children: Not on file   Years of education: 10   Highest education level: Not on file  Occupational History   Not on file  Tobacco Use   Smoking status: Every Day    Packs/day: 0.50    Years: 5.00    Total pack years: 2.50    Types: Cigarettes   Smokeless tobacco: Never  Vaping Use   Vaping Use: Never used  Substance and Sexual Activity    Alcohol use: Not Currently   Drug use: Not Currently    Types: Marijuana, Cocaine, "Crack" cocaine   Sexual activity: Yes    Birth control/protection: None  Other Topics Concern   Not on file  Social History Narrative   ** Merged History Encounter **       ** Merged History Encounter **       ** Merged History Encounter **       Social Determinants of Radio broadcast assistant Strain: Not on file  Food Insecurity: Not on file  Transportation Needs: Not on file  Physical Activity: Not on file  Stress: Not on file  Social Connections: Not on file   Additional Social History:    Allergies:  No Known Allergies  Labs:  Results for orders placed or performed during the hospital encounter of 10/31/22 (from the past 48 hour(s))  Ethanol     Status: None   Collection Time: 10/31/22  8:25 AM  Result Value Ref Range   Alcohol, Ethyl (B) <10 <10 mg/dL    Comment: (NOTE) Lowest detectable limit for serum alcohol is 10 mg/dL.  For medical purposes only. Performed at [facility], [place] [address], [place], [place] [address]   Rapid urine drug screen (hospital performed)     Status: Abnormal   Collection Time: 10/31/22  8:30 AM  Result Value Ref Range   Opiates NONE DETECTED NONE DETECTED   Cocaine POSITIVE (A) NONE DETECTED   Benzodiazepines NONE DETECTED NONE DETECTED   Amphetamines NONE DETECTED NONE DETECTED   Tetrahydrocannabinol POSITIVE (A) NONE DETECTED   Barbiturates NONE DETECTED NONE DETECTED    Comment: (NOTE) DRUG SCREEN FOR MEDICAL PURPOSES ONLY.  IF CONFIRMATION IS NEEDED FOR ANY PURPOSE, NOTIFY LAB WITHIN 5 DAYS.  LOWEST DETECTABLE LIMITS FOR URINE DRUG SCREEN Drug Class                     Cutoff (ng/mL) Amphetamine and metabolites    1000 Barbiturate and metabolites    200 Benzodiazepine                 200 Opiates and metabolites        300 Cocaine and metabolites        300 THC                            50 Performed at [facility], [place] [address] [place], [place] [address]     No current facility-administered medications for this encounter.   Current Outpatient Medications  Medication Sig Dispense Refill   acetaminophen (TYLENOL) 500 MG tablet Take 1,000 mg by mouth every 6 (six) hours as needed for moderate pain.     paliperidone (INVEGA SUSTENNA) 234 MG/1.5ML SUSY injection Inject 234 mg into the muscle once for 1 dose. (Patient  taking differently: Inject 234 mg into the muscle every 30 (thirty) days.) 1.5 mL 0   Facility-Administered Medications Ordered in Other Encounters  Medication Dose Route Frequency Provider Last Rate Last Admin   acetaminophen (TYLENOL) tablet 650 mg  650 mg Oral Q6H PRN Tharon Aquas, NP       alum & mag hydroxide-simeth (MAALOX/MYLANTA) 200-200-20 MG/5ML suspension 30 mL  30 mL Oral Q4H PRN Tharon Aquas, NP       hydrOXYzine (ATARAX) tablet 25 mg  25 mg Oral TID PRN Tharon Aquas, NP       magnesium hydroxide (MILK OF MAGNESIA) suspension 30 mL  30 mL Oral Daily PRN Tharon Aquas, NP       traZODone (DESYREL) tablet 50 mg  50 mg Oral QHS PRN Tharon Aquas, NP         Psychiatric Specialty Exam: Presentation  General Appearance:  Other (comment) (dressed in scrubs)  Eye Contact: Fair  Speech: Clear and Coherent; Normal Rate  Speech Volume: Normal  Handedness: Right   Mood and Affect  Mood: Euthymic  Affect: Flat   Thought Process  Thought Processes: Coherent; Goal Directed; Linear  Descriptions of Associations:Intact  Orientation:Full (Time, Place and Person)  Thought Content:Logical  History of Schizophrenia/Schizoaffective disorder:No  Duration of Psychotic Symptoms:N/A  Hallucinations:No data recorded Ideas of Reference:None  Suicidal Thoughts:Suicidal Thoughts: No  Homicidal Thoughts:Homicidal Thoughts: No   Sensorium  Memory: Immediate Fair  Judgment: Fair  Insight: Fair   Chemical engineer  Concentration: Fair  Attention Span: Fair  Recall: [place] of Knowledge: Fair  Language: Fair   Psychomotor Activity  Psychomotor Activity:Psychomotor Activity: Normal   Assets  Assets: Communication Skills; Desire for Improvement; Financial Resources/Insurance; Resilience    Sleep  Sleep:Sleep: Fair Number of Hours of Sleep: 0 (6 to 8 hours/night)   Physical Exam: Physical Exam Constitutional:      Appearance: Normal appearance.  HENT:     Head: Normocephalic.     Nose: Nose normal.  Eyes:     Extraocular Movements: Extraocular movements intact.     Pupils: Pupils are equal, round, and reactive to light.  Cardiovascular:     Rate and Rhythm: Normal rate.  Pulmonary:     Effort: Pulmonary effort is normal.     Breath sounds: Normal breath sounds.  Musculoskeletal:        General: Normal range of motion.     Cervical back: Normal range of motion.  Neurological:     General: No focal deficit present.     Mental Status: He is alert.    Review of Systems  Psychiatric/Behavioral:  Positive for substance abuse.    Blood pressure (!) 124/56, pulse 71, temperature 98.3 F (36.8 C), temperature source Oral, resp. rate 16, height '5\' 8"'$  (1.727 m), weight 69.4 kg, SpO2 100 %. Body mass index is 23.26 kg/m.  Medical Decision Making: Patient case review and discussed with Dr. Dwyane Dee, patient does not meet inpatient criteria however is wanting assistance with returning home to [facility].  In the process of working with a TOC to secure collateral from brother who previously reported that patient can return home however he did not have the financial means to be able to have him transported back to [place].  In the meantime, patient is in agreement to remain at [facility] transportation accommodations can be made.Patient is in denial of any SI, HI, AH, VH.  Patient is psychiatrically cleared.  Attempted multiple times throughout  the shift to reach [facility]  via phone number provider by colleague [phone], brother of Tyler White. He does not have a voicemail set up on this number therefore was unable to leave a HIPAA compliant message.  Call several times throughout the day unable to reach brother. Will notify TOC if efforts and [place] providers.  Disposition: No evidence of imminent risk to self or others at present, patient psych cleared, however transferred to [facility] while SW coordinate assisting patient with returning home to [place].    Molli Barrows, FNP-C, PMHNP-BC 10/31/2022 6:33 PM

## 2022-10-31 NOTE — ED Notes (Signed)
PT ambulatory to [facility] transport drivers car. Eric at [facility] reported he is aware of Pt need for bus  pass to [facility]

## 2022-10-31 NOTE — ED Notes (Signed)
Pt alert, calm, cooperative at this time

## 2022-10-31 NOTE — ED Notes (Signed)
Pt sleeping at present, no distress noted.  Monitoring for safety. 

## 2022-11-01 NOTE — ED Notes (Signed)
Snacks given 

## 2022-11-01 NOTE — ED Notes (Signed)
Pt sleeping at present, no distress noted.  As per [facility], pt will need to be transported to [facility] via [place].  Safe Transport transports Facility to facility only.

## 2022-11-01 NOTE — ED Provider Notes (Signed)
Behavioral Health Progress Note  Date and Time: 11/01/2022 10:38 AM Name: Tyler White MRN:  [id number]  Subjective:   On reassessment, pt is asleep, awakens to name being called. Pt is a&ox3, in no acute distress, non-toxic appearing. He reports euthymic mood. He denies SI/VI/HI, AVH, paranoia. Discussed staff are making efforts to reach his brother. Pt states he does not know his brother's contact information and cannot recall when they last spoke. Dispo is pending.  Diagnosis:  Final diagnoses:  Homelessness    Total Time spent with patient: 20 minutes  Past Psychiatric History:  Past Medical History:  Past Medical History:  Diagnosis Date   Hernia, inguinal, right    Inguinal hernia    right   Psychiatric illness    Schizophrenia ([place])    Suicidal ideation    No past surgical history on file. Family History:  Family History  Problem Relation Age of Onset   Psychiatric Illness Mother    Hypertension Sister    Family Psychiatric  History: None reported Social History:  Social History   Substance and Sexual Activity  Alcohol Use Not Currently     Social History   Substance and Sexual Activity  Drug Use Not Currently   Types: Marijuana, Cocaine, "Crack" cocaine    Social History   Socioeconomic History   Marital status: Single    Spouse name: Not on file   Number of children: Not on file   Years of education: 10   Highest education level: Not on file  Occupational History   Not on file  Tobacco Use   Smoking status: Every Day    Packs/day: 0.50    Years: 5.00    Total pack years: 2.50    Types: Cigarettes   Smokeless tobacco: Never  Vaping Use   Vaping Use: Never used  Substance and Sexual Activity   Alcohol use: Not Currently   Drug use: Not Currently    Types: Marijuana, Cocaine, "Crack" cocaine   Sexual activity: Yes    Birth control/protection: None  Other Topics Concern   Not on file  Social History Narrative   ** Merged History  Encounter **       ** Merged History Encounter **       ** Merged History Encounter **       Social Determinants of Health   Financial Resource Strain: Not on file  Food Insecurity: Not on file  Transportation Needs: Not on file  Physical Activity: Not on file  Stress: Not on file  Social Connections: Not on file   SDOH:  SDOH Screenings   Alcohol Screen: Low Risk  (01/29/2022)  Depression (PHQ2-9): High Risk (09/19/2022)  Tobacco Use: High Risk (10/31/2022)   Additional Social History:                         Sleep: Good  Appetite:  Good  Current Medications:  Current Facility-Administered Medications  Medication Dose Route Frequency Provider Last Rate Last Admin   acetaminophen (TYLENOL) tablet 650 mg  650 mg Oral Q6H PRN Tharon Aquas, NP       alum & mag hydroxide-simeth (MAALOX/MYLANTA) 200-200-20 MG/5ML suspension 30 mL  30 mL Oral Q4H PRN Tharon Aquas, NP       hydrOXYzine (ATARAX) tablet 25 mg  25 mg Oral TID PRN Tharon Aquas, NP   25 mg at 10/31/22 2208   magnesium hydroxide (MILK OF MAGNESIA) suspension 30 mL  30 mL Oral Daily PRN Tharon Aquas, NP       traZODone (DESYREL) tablet 50 mg  50 mg Oral QHS PRN Tharon Aquas, NP   50 mg at 10/31/22 2208   Current Outpatient Medications  Medication Sig Dispense Refill   acetaminophen (TYLENOL) 500 MG tablet Take 1,000 mg by mouth every 6 (six) hours as needed for moderate pain.     paliperidone (INVEGA SUSTENNA) 234 MG/1.5ML SUSY injection Inject 234 mg into the muscle once for 1 dose. (Patient taking differently: Inject 234 mg into the muscle every 30 (thirty) days.) 1.5 mL 0    Labs  Lab Results:  Admission on 10/31/2022, Discharged on 10/31/2022  Component Date Value Ref Range Status   Alcohol, Ethyl (B) 10/31/2022 <10  <10 mg/dL Final   Comment: (NOTE) Lowest detectable limit for serum alcohol is 10 mg/dL.  For medical purposes only. Performed at [facility], [place] [address] [place], [place] [address]    Opiates 10/31/2022 NONE DETECTED  NONE DETECTED Final   Cocaine 10/31/2022 POSITIVE (A)  NONE DETECTED Final   Benzodiazepines 10/31/2022 NONE DETECTED  NONE DETECTED Final   Amphetamines 10/31/2022 NONE DETECTED  NONE DETECTED Final   Tetrahydrocannabinol 10/31/2022 POSITIVE (A)  NONE DETECTED Final   Barbiturates 10/31/2022 NONE DETECTED  NONE DETECTED Final   Comment: (NOTE) DRUG SCREEN FOR MEDICAL PURPOSES ONLY.  IF CONFIRMATION IS NEEDED FOR ANY PURPOSE, NOTIFY LAB WITHIN 5 DAYS.  LOWEST DETECTABLE LIMITS FOR URINE DRUG SCREEN Drug Class                     Cutoff (ng/mL) Amphetamine and metabolites    1000 Barbiturate and metabolites    200 Benzodiazepine                 200 Opiates and metabolites        300 Cocaine and metabolites        300 THC                            50 Performed at [facility], [place] [address], [place], [place] [address]    SARS Coronavirus 2 10/31/2022 NEGATIVE  NEGATIVE Final   Comment: (NOTE) SARS-CoV-2 target nucleic acids are NOT DETECTED.  The SARS-CoV-2 RNA is generally detectable in upper and lower respiratory specimens during the acute phase of infection. Negative results do not preclude SARS-CoV-2 infection, do not rule out co-infections with other pathogens, and should not be used as the sole basis for treatment or other patient management decisions. Negative results must be combined with clinical observations, patient history, and epidemiological information. The expected result is Negative.  Fact Sheet for Patients: SugarRoll.be  Fact Sheet for Healthcare Providers: https://www.woods-mathews.com/  This test is not yet approved or cleared by the [place] FDA and  has been authorized for detection and/or diagnosis of SARS-CoV-2 by FDA under an Emergency Use Authorization (EUA). This EUA will remain  in effect (meaning this test can  be used) for the duration of the COVID-19 declaration under Se                          ction 564(b)(1) of the Act, 21 U.S.C. section 360bbb-3(b)(1), unless the authorization is terminated or revoked sooner.  Performed at [facility], [place] [address], [place], [place] [address]   Admission on  10/17/2022, Discharged on 10/17/2022  Component Date Value Ref Range Status   Color, Urine 10/17/2022 AMBER (A)  YELLOW Final   BIOCHEMICALS MAY BE AFFECTED BY COLOR   APPearance 10/17/2022 HAZY (A)  CLEAR Final   Specific Gravity, Urine 10/17/2022 1.030  1.005 - 1.030 Final   pH 10/17/2022 5.0  5.0 - 8.0 Final   Glucose, UA 10/17/2022 NEGATIVE  NEGATIVE mg/dL Final   Hgb urine dipstick 10/17/2022 NEGATIVE  NEGATIVE Final   Bilirubin Urine 10/17/2022 NEGATIVE  NEGATIVE Final   Ketones, ur 10/17/2022 NEGATIVE  NEGATIVE mg/dL Final   Protein, ur 10/17/2022 30 (A)  NEGATIVE mg/dL Final   Nitrite 10/17/2022 NEGATIVE  NEGATIVE Final   Leukocytes,Ua 10/17/2022 LARGE (A)  NEGATIVE Final   RBC / HPF 10/17/2022 0-5  0 - 5 RBC/hpf Final   WBC, UA 10/17/2022 >50 (H)  0 - 5 WBC/hpf Final   Bacteria, UA 10/17/2022 RARE (A)  NONE SEEN Final   Squamous Epithelial / LPF 10/17/2022 0-5  0 - 5 Final   Mucus 10/17/2022 PRESENT   Final   Performed at [facility], [place] [address] [place], [place] [address]  Admission on 10/12/2022, Discharged on 10/13/2022  Component Date Value Ref Range Status   Glucose-Capillary 10/12/2022 115 (H)  70 - 99 mg/dL Final   Glucose reference range applies only to samples taken after fasting for at least 8 hours.   Sodium 10/12/2022 136  135 - 145 mmol/L Final   Potassium 10/12/2022 4.0  3.5 - 5.1 mmol/L Final   Chloride 10/12/2022 104  98 - 111 mmol/L Final   CO2 10/12/2022 22  22 - 32 mmol/L Final   Glucose, Bld 10/12/2022 105 (H)  70 - 99 mg/dL Final   Glucose reference range applies only to samples taken after fasting for at least 8 hours.   BUN 10/12/2022 20  6  - 20 mg/dL Final   Creatinine, Ser 10/12/2022 0.99  0.61 - 1.24 mg/dL Final   Calcium 10/12/2022 9.3  8.9 - 10.3 mg/dL Final   GFR, Estimated 10/12/2022 >60  >60 mL/min Final   Comment: (NOTE) Calculated using the CKD-EPI Creatinine Equation (2021)    Anion gap 10/12/2022 10  5 - 15 Final   Performed at [facility], [place] [address] [place], [place] [address]   WBC 10/12/2022 9.5  4.0 - 10.5 K/uL Final   RBC 10/12/2022 4.48  4.22 - 5.81 MIL/uL Final   Hemoglobin 10/12/2022 12.4 (L)  13.0 - 17.0 g/dL Final   HCT 10/12/2022 37.4 (L)  39.0 - 52.0 % Final   MCV 10/12/2022 83.5  80.0 - 100.0 fL Final   MCH 10/12/2022 27.7  26.0 - 34.0 pg Final   MCHC 10/12/2022 33.2  30.0 - 36.0 g/dL Final   RDW 10/12/2022 13.3  11.5 - 15.5 % Final   Platelets 10/12/2022 319  150 - 400 K/uL Final   nRBC 10/12/2022 0.0  0.0 - 0.2 % Final   Performed at [facility] [address] [place], [place] [address]  Admission on 10/11/2022, Discharged on 10/11/2022  Component Date Value Ref Range Status   SARS Coronavirus 2 by RT PCR 10/11/2022 NEGATIVE  NEGATIVE Final   Comment: (NOTE) SARS-CoV-2 target nucleic acids are NOT DETECTED.  The SARS-CoV-2 RNA is generally detectable in upper respiratory specimens during the acute phase of infection. The lowest concentration of SARS-CoV-2 viral copies this assay can detect is 138 copies/mL. A negative result does  not preclude SARS-Cov-2 infection and should not be used as the sole basis for treatment or other patient management decisions. A negative result may occur with  improper specimen collection/handling, submission of specimen other than nasopharyngeal swab, presence of viral mutation(s) within the areas targeted by this assay, and inadequate number of viral copies(<138 copies/mL). A negative result must be combined with clinical observations, patient history, and epidemiological information. The expected result is Negative.  Fact Sheet for  Patients:  EntrepreneurPulse.com.au  Fact Sheet for Healthcare Providers:  IncredibleEmployment.be  This test is no                          t yet approved or cleared by the [place] FDA and  has been authorized for detection and/or diagnosis of SARS-CoV-2 by FDA under an Emergency Use Authorization (EUA). This EUA will remain  in effect (meaning this test can be used) for the duration of the COVID-19 declaration under Section 564(b)(1) of the Act, 21 U.S.C.section 360bbb-3(b)(1), unless the authorization is terminated  or revoked sooner.       Influenza A by PCR 10/11/2022 NEGATIVE  NEGATIVE Final   Influenza B by PCR 10/11/2022 NEGATIVE  NEGATIVE Final   Comment: (NOTE) The Xpert Xpress SARS-CoV-2/FLU/RSV plus assay is intended as an aid in the diagnosis of influenza from Nasopharyngeal swab specimens and should not be used as a sole basis for treatment. Nasal washings and aspirates are unacceptable for Xpert Xpress SARS-CoV-2/FLU/RSV testing.  Fact Sheet for Patients: EntrepreneurPulse.com.au  Fact Sheet for Healthcare Providers: IncredibleEmployment.be  This test is not yet approved or cleared by the [place] FDA and has been authorized for detection and/or diagnosis of SARS-CoV-2 by FDA under an Emergency Use Authorization (EUA). This EUA will remain in effect (meaning this test can be used) for the duration of the COVID-19 declaration under Section 564(b)(1) of the Act, 21 U.S.C. section 360bbb-3(b)(1), unless the authorization is terminated or revoked.  Performed at [facility], [place] [address] [place], [place] [address]   Admission on 10/09/2022, Discharged on 10/09/2022  Component Date Value Ref Range Status   Sodium 10/09/2022 135  135 - 145 mmol/L Final   Potassium 10/09/2022 3.6  3.5 - 5.1 mmol/L Final   Chloride 10/09/2022 104  98 - 111 mmol/L Final   CO2 10/09/2022 24   22 - 32 mmol/L Final   Glucose, Bld 10/09/2022 112 (H)  70 - 99 mg/dL Final   Glucose reference range applies only to samples taken after fasting for at least 8 hours.   BUN 10/09/2022 18  6 - 20 mg/dL Final   Creatinine, Ser 10/09/2022 0.92  0.61 - 1.24 mg/dL Final   Calcium 10/09/2022 8.9  8.9 - 10.3 mg/dL Final   Total Protein 10/09/2022 6.9  6.5 - 8.1 g/dL Final   Albumin 10/09/2022 3.6  3.5 - 5.0 g/dL Final   AST 10/09/2022 36  15 - 41 U/L Final   ALT 10/09/2022 25  0 - 44 U/L Final   Alkaline Phosphatase 10/09/2022 53  38 - 126 U/L Final   Total Bilirubin 10/09/2022 0.6  0.3 - 1.2 mg/dL Final   GFR, Estimated 10/09/2022 >60  >60 mL/min Final   Comment: (NOTE) Calculated using the CKD-EPI Creatinine Equation (2021)    Anion gap 10/09/2022 7  5 - 15 Final   Performed at [facility] [address], [place], [place] [address]   Alcohol, Ethyl (B)  10/09/2022 <10  <10 mg/dL Final   Comment: (NOTE) Lowest detectable limit for serum alcohol is 10 mg/dL.  For medical purposes only. Performed at [facility], [place] [address], [place], [place] [address]    Salicylate Lvl 12/45/8099 <7.0 (L)  7.0 - 30.0 mg/dL Final   Performed at [facility] [address] [place], [place] [address]   Acetaminophen (Tylenol), Serum 10/09/2022 <10 (L)  10 - 30 ug/mL Final   Comment: (NOTE) Therapeutic concentrations vary significantly. A range of 10-30 ug/mL  may be an effective concentration for many patients. However, some  are best treated at concentrations outside of this range. Acetaminophen concentrations >150 ug/mL at 4 hours after ingestion  and >50 ug/mL at 12 hours after ingestion are often associated with  toxic reactions.  Performed at [facility], [place] [address], [place], [place] [address]    WBC 10/09/2022 7.2  4.0 - 10.5 K/uL Final   RBC 10/09/2022 4.69  4.22 - 5.81 MIL/uL Final   Hemoglobin 10/09/2022 13.2  13.0 - 17.0 g/dL Final   HCT 10/09/2022 37.9  (L)  39.0 - 52.0 % Final   MCV 10/09/2022 80.8  80.0 - 100.0 fL Final   MCH 10/09/2022 28.1  26.0 - 34.0 pg Final   MCHC 10/09/2022 34.8  30.0 - 36.0 g/dL Final   RDW 10/09/2022 13.6  11.5 - 15.5 % Final   Platelets 10/09/2022 362  150 - 400 K/uL Final   nRBC 10/09/2022 0.0  0.0 - 0.2 % Final   Performed at [facility] [address], [place], [place] [address]   Opiates 10/09/2022 NONE DETECTED  NONE DETECTED Final   Cocaine 10/09/2022 POSITIVE (A)  NONE DETECTED Final   Benzodiazepines 10/09/2022 NONE DETECTED  NONE DETECTED Final   Amphetamines 10/09/2022 NONE DETECTED  NONE DETECTED Final   Tetrahydrocannabinol 10/09/2022 POSITIVE (A)  NONE DETECTED Final   Barbiturates 10/09/2022 NONE DETECTED  NONE DETECTED Final   Comment: (NOTE) DRUG SCREEN FOR MEDICAL PURPOSES ONLY.  IF CONFIRMATION IS NEEDED FOR ANY PURPOSE, NOTIFY LAB WITHIN 5 DAYS.  LOWEST DETECTABLE LIMITS FOR URINE DRUG SCREEN Drug Class                     Cutoff (ng/mL) Amphetamine and metabolites    1000 Barbiturate and metabolites    200 Benzodiazepine                 200 Opiates and metabolites        300 Cocaine and metabolites        300 THC                            50 Performed at [facility], [place] [address] [place], [place] [address]   Admission on 10/03/2022, Discharged on 10/03/2022  Component Date Value Ref Range Status   SARS Coronavirus 2 by RT PCR 10/03/2022 NEGATIVE  NEGATIVE Final   Comment: (NOTE) SARS-CoV-2 target nucleic acids are NOT DETECTED.  The SARS-CoV-2 RNA is generally detectable in upper respiratory specimens during the acute phase of infection. The lowest concentration of SARS-CoV-2 viral copies this assay can detect is 138 copies/mL. A negative result does not preclude SARS-Cov-2 infection and should not be used as the sole basis for treatment or other patient management decisions. A negative result may occur with  improper specimen collection/handling,  submission of specimen  other than nasopharyngeal swab, presence of viral mutation(s) within the areas targeted by this assay, and inadequate number of viral copies(<138 copies/mL). A negative result must be combined with clinical observations, patient history, and epidemiological information. The expected result is Negative.  Fact Sheet for Patients:  EntrepreneurPulse.com.au  Fact Sheet for Healthcare Providers:  IncredibleEmployment.be  This test is no                          t yet approved or cleared by the [place] FDA and  has been authorized for detection and/or diagnosis of SARS-CoV-2 by FDA under an Emergency Use Authorization (EUA). This EUA will remain  in effect (meaning this test can be used) for the duration of the COVID-19 declaration under Section 564(b)(1) of the Act, 21 U.S.C.section 360bbb-3(b)(1), unless the authorization is terminated  or revoked sooner.       Influenza A by PCR 10/03/2022 NEGATIVE  NEGATIVE Final   Influenza B by PCR 10/03/2022 NEGATIVE  NEGATIVE Final   Comment: (NOTE) The Xpert Xpress SARS-CoV-2/FLU/RSV plus assay is intended as an aid in the diagnosis of influenza from Nasopharyngeal swab specimens and should not be used as a sole basis for treatment. Nasal washings and aspirates are unacceptable for Xpert Xpress SARS-CoV-2/FLU/RSV testing.  Fact Sheet for Patients: EntrepreneurPulse.com.au  Fact Sheet for Healthcare Providers: IncredibleEmployment.be  This test is not yet approved or cleared by the [place] FDA and has been authorized for detection and/or diagnosis of SARS-CoV-2 by FDA under an Emergency Use Authorization (EUA). This EUA will remain in effect (meaning this test can be used) for the duration of the COVID-19 declaration under Section 564(b)(1) of the Act, 21 U.S.C. section 360bbb-3(b)(1), unless the authorization is terminated  or revoked.  Performed at [facility], [place] [address], [place], [place] [address]    WBC 10/03/2022 6.5  4.0 - 10.5 K/uL Final   RBC 10/03/2022 4.65  4.22 - 5.81 MIL/uL Final   Hemoglobin 10/03/2022 12.9 (L)  13.0 - 17.0 g/dL Final   HCT 10/03/2022 38.5 (L)  39.0 - 52.0 % Final   MCV 10/03/2022 82.8  80.0 - 100.0 fL Final   MCH 10/03/2022 27.7  26.0 - 34.0 pg Final   MCHC 10/03/2022 33.5  30.0 - 36.0 g/dL Final   RDW 10/03/2022 13.6  11.5 - 15.5 % Final   Platelets 10/03/2022 368  150 - 400 K/uL Final   nRBC 10/03/2022 0.0  0.0 - 0.2 % Final   Neutrophils Relative % 10/03/2022 65  % Final   Neutro Abs 10/03/2022 4.3  1.7 - 7.7 K/uL Final   Lymphocytes Relative 10/03/2022 22  % Final   Lymphs Abs 10/03/2022 1.4  0.7 - 4.0 K/uL Final   Monocytes Relative 10/03/2022 11  % Final   Monocytes Absolute 10/03/2022 0.7  0.1 - 1.0 K/uL Final   Eosinophils Relative 10/03/2022 1  % Final   Eosinophils Absolute 10/03/2022 0.1  0.0 - 0.5 K/uL Final   Basophils Relative 10/03/2022 1  % Final   Basophils Absolute 10/03/2022 0.0  0.0 - 0.1 K/uL Final   Immature Granulocytes 10/03/2022 0  % Final   Abs Immature Granulocytes 10/03/2022 0.01  0.00 - 0.07 K/uL Final   Performed at [facility], [place] [address] [place], [place] [address]   Sodium 10/03/2022 136  135 - 145 mmol/L Final   Potassium 10/03/2022 3.5  3.5 - 5.1 mmol/L Final  Chloride 10/03/2022 102  98 - 111 mmol/L Final   CO2 10/03/2022 22  22 - 32 mmol/L Final   Glucose, Bld 10/03/2022 96  70 - 99 mg/dL Final   Glucose reference range applies only to samples taken after fasting for at least 8 hours.   BUN 10/03/2022 17  6 - 20 mg/dL Final   Creatinine, Ser 10/03/2022 1.10  0.61 - 1.24 mg/dL Final   Calcium 10/03/2022 9.2  8.9 - 10.3 mg/dL Final   Total Protein 10/03/2022 7.2  6.5 - 8.1 g/dL Final   Albumin 10/03/2022 3.7  3.5 - 5.0 g/dL Final   AST 10/03/2022 42 (H)  15 - 41 U/L Final   ALT 10/03/2022 27  0 - 44 U/L  Final   Alkaline Phosphatase 10/03/2022 49  38 - 126 U/L Final   Total Bilirubin 10/03/2022 0.4  0.3 - 1.2 mg/dL Final   GFR, Estimated 10/03/2022 >60  >60 mL/min Final   Comment: (NOTE) Calculated using the CKD-EPI Creatinine Equation (2021)    Anion gap 10/03/2022 12  5 - 15 Final   Performed at [facility] [address] [place], [place] [address]   TSH 10/03/2022 1.945  0.350 - 4.500 uIU/mL Final   Comment: Performed by a 3rd Generation assay with a functional sensitivity of <=0.01 uIU/mL. Performed at [facility], [place] [address] [place], [place] [address]    SARSCOV2ONAVIRUS 2 AG 10/03/2022 NEGATIVE  NEGATIVE Final   Comment: (NOTE) SARS-CoV-2 antigen NOT DETECTED.   Negative results are presumptive.  Negative results do not preclude SARS-CoV-2 infection and should not be used as the sole basis for treatment or other patient management decisions, including infection  control decisions, particularly in the presence of clinical signs and  symptoms consistent with COVID-19, or in those who have been in contact with the virus.  Negative results must be combined with clinical observations, patient history, and epidemiological information. The expected result is Negative.  Fact Sheet for Patients: HandmadeRecipes.com.cy  Fact Sheet for Healthcare Providers: FuneralLife.at  This test is not yet approved or cleared by the [place] FDA and  has been authorized for detection and/or diagnosis of SARS-CoV-2 by FDA under an Emergency Use Authorization (EUA).  This EUA will remain in effect (meaning this test can be used) for the duration of  the COV                          ID-19 declaration under Section 564(b)(1) of the Act, 21 U.S.C. section 360bbb-3(b)(1), unless the authorization is terminated or revoked sooner.    Admission on 10/02/2022, Discharged on 10/02/2022  Component Date Value Ref Range Status   SARS  Coronavirus 2 by RT PCR 10/02/2022 NEGATIVE  NEGATIVE Final   Comment: (NOTE) SARS-CoV-2 target nucleic acids are NOT DETECTED.  The SARS-CoV-2 RNA is generally detectable in upper and lower respiratory specimens during the acute phase of infection. The lowest concentration of SARS-CoV-2 viral copies this assay can detect is 250 copies / mL. A negative result does not preclude SARS-CoV-2 infection and should not be used as the sole basis for treatment or other patient management decisions.  A negative result may occur with improper specimen collection / handling, submission of specimen other than nasopharyngeal swab, presence of viral mutation(s) within the areas targeted by this assay, and inadequate number of viral copies (<250 copies / mL). A negative result must be combined with clinical observations, patient  history, and epidemiological information.  Fact Sheet for Patients:   https://www.patel.info/  Fact Sheet for Healthcare Providers: https://hall.com/  This test is not yet approved or                           cleared by the [place] FDA and has been authorized for detection and/or diagnosis of SARS-CoV-2 by FDA under an Emergency Use Authorization (EUA).  This EUA will remain in effect (meaning this test can be used) for the duration of the COVID-19 declaration under Section 564(b)(1) of the Act, 21 U.S.C. section 360bbb-3(b)(1), unless the authorization is terminated or revoked sooner.  Performed at [facility], [place] [address], [place], [place] [address]   Admission on 09/19/2022, Discharged on 09/19/2022  Component Date Value Ref Range Status   Sodium 09/19/2022 137  135 - 145 mmol/L Final   Potassium 09/19/2022 3.5  3.5 - 5.1 mmol/L Final   Chloride 09/19/2022 103  98 - 111 mmol/L Final   CO2 09/19/2022 25  22 - 32 mmol/L Final   Glucose, Bld 09/19/2022 83  70 - 99 mg/dL Final   Glucose reference range applies  only to samples taken after fasting for at least 8 hours.   BUN 09/19/2022 12  6 - 20 mg/dL Final   Creatinine, Ser 09/19/2022 0.96  0.61 - 1.24 mg/dL Final   Calcium 09/19/2022 9.6  8.9 - 10.3 mg/dL Final   Total Protein 09/19/2022 7.6  6.5 - 8.1 g/dL Final   Albumin 09/19/2022 3.7  3.5 - 5.0 g/dL Final   AST 09/19/2022 32  15 - 41 U/L Final   ALT 09/19/2022 29  0 - 44 U/L Final   Alkaline Phosphatase 09/19/2022 57  38 - 126 U/L Final   Total Bilirubin 09/19/2022 0.3  0.3 - 1.2 mg/dL Final   GFR, Estimated 09/19/2022 >60  >60 mL/min Final   Comment: (NOTE) Calculated using the CKD-EPI Creatinine Equation (2021)    Anion gap 09/19/2022 9  5 - 15 Final   Performed at [facility] [address], [place], [place] [address]   Alcohol, Ethyl (B) 09/19/2022 <10  <10 mg/dL Final   Comment: (NOTE) Lowest detectable limit for serum alcohol is 10 mg/dL.  For medical purposes only. Performed at [facility], [place] [address], [place], [place] [address]    WBC 09/19/2022 10.4  4.0 - 10.5 K/uL Final   RBC 09/19/2022 4.55  4.22 - 5.81 MIL/uL Final   Hemoglobin 09/19/2022 12.6 (L)  13.0 - 17.0 g/dL Final   HCT 09/19/2022 38.3 (L)  39.0 - 52.0 % Final   MCV 09/19/2022 84.2  80.0 - 100.0 fL Final   MCH 09/19/2022 27.7  26.0 - 34.0 pg Final   MCHC 09/19/2022 32.9  30.0 - 36.0 g/dL Final   RDW 09/19/2022 13.6  11.5 - 15.5 % Final   Platelets 09/19/2022 420 (H)  150 - 400 K/uL Final   nRBC 09/19/2022 0.0  0.0 - 0.2 % Final   Performed at [facility], [place] [address], [place], [place] [address]   Opiates 09/19/2022 NONE DETECTED  NONE DETECTED Final   Cocaine 09/19/2022 POSITIVE (A)  NONE DETECTED Final   Benzodiazepines 09/19/2022 NONE DETECTED  NONE DETECTED Final   Amphetamines 09/19/2022 NONE DETECTED  NONE DETECTED Final   Tetrahydrocannabinol 09/19/2022 POSITIVE (A)  NONE DETECTED Final   Barbiturates 09/19/2022 NONE DETECTED  NONE DETECTED Final  Comment: (NOTE) DRUG  SCREEN FOR MEDICAL PURPOSES ONLY.  IF CONFIRMATION IS NEEDED FOR ANY PURPOSE, NOTIFY LAB WITHIN 5 DAYS.  LOWEST DETECTABLE LIMITS FOR URINE DRUG SCREEN Drug Class                     Cutoff (ng/mL) Amphetamine and metabolites    1000 Barbiturate and metabolites    200 Benzodiazepine                 629 Tricyclics and metabolites     300 Opiates and metabolites        300 Cocaine and metabolites        300 THC                            50 Performed at [facility], [place] [address], [place], [place] [address]    Salicylate Lvl 65/02/5464 <7.0 (L)  7.0 - 30.0 mg/dL Final   Performed at [facility] [address], [place], [place] [address]   Acetaminophen (Tylenol), Serum 09/19/2022 <10 (L)  10 - 30 ug/mL Final   Comment: (NOTE) Therapeutic concentrations vary significantly. A range of 10-30 ug/mL  may be an effective concentration for many patients. However, some  are best treated at concentrations outside of this range. Acetaminophen concentrations >150 ug/mL at 4 hours after ingestion  and >50 ug/mL at 12 hours after ingestion are often associated with  toxic reactions.  Performed at [facility], [place] [address] [place], [place] [address]    SARS Coronavirus 2 by RT PCR 09/19/2022 NEGATIVE  NEGATIVE Final   Comment: (NOTE) SARS-CoV-2 target nucleic acids are NOT DETECTED.  The SARS-CoV-2 RNA is generally detectable in upper respiratory specimens during the acute phase of infection. The lowest concentration of SARS-CoV-2 viral copies this assay can detect is 138 copies/mL. A negative result does not preclude SARS-Cov-2 infection and should not be used as the sole basis for treatment or other patient management decisions. A negative result may occur with  improper specimen collection/handling, submission of specimen other than nasopharyngeal swab, presence of viral mutation(s) within the areas targeted by this assay, and inadequate number of  viral copies(<138 copies/mL). A negative result must be combined with clinical observations, patient history, and epidemiological information. The expected result is Negative.  Fact Sheet for Patients:  EntrepreneurPulse.com.au  Fact Sheet for Healthcare Providers:  IncredibleEmployment.be  This test is no                          t yet approved or cleared by the [place] FDA and  has been authorized for detection and/or diagnosis of SARS-CoV-2 by FDA under an Emergency Use Authorization (EUA). This EUA will remain  in effect (meaning this test can be used) for the duration of the COVID-19 declaration under Section 564(b)(1) of the Act, 21 U.S.C.section 360bbb-3(b)(1), unless the authorization is terminated  or revoked sooner.       Influenza A by PCR 09/19/2022 NEGATIVE  NEGATIVE Final   Influenza B by PCR 09/19/2022 NEGATIVE  NEGATIVE Final   Comment: (NOTE) The Xpert Xpress SARS-CoV-2/FLU/RSV plus assay is intended as an aid in the diagnosis of influenza from Nasopharyngeal swab specimens and should not be used as a sole basis for treatment. Nasal washings and aspirates are unacceptable for Xpert Xpress SARS-CoV-2/FLU/RSV testing.  Fact Sheet for Patients: EntrepreneurPulse.com.au  Fact Sheet for Healthcare Providers: IncredibleEmployment.be  This test is not yet approved or cleared by the [place] FDA and has been authorized for detection and/or diagnosis of SARS-CoV-2 by FDA under an Emergency Use Authorization (EUA). This EUA will remain in effect (meaning this test can be used) for the duration of the COVID-19 declaration under Section 564(b)(1) of the Act, 21 U.S.C. section 360bbb-3(b)(1), unless the authorization is terminated or revoked.  Performed at [facility], [place] [address], [place], [place] [address]   Admission on 09/11/2022, Discharged on 09/11/2022  Component Date  Value Ref Range Status   T Pallidum Abs 09/09/2022 Reactive (A)  Non Reactive Final   Comment: (NOTE) Performed At: [facility] [place], [place] [id number] Rush Farmer MD WC:[id number]   Admission on 09/10/2022, Discharged on 09/10/2022  Component Date Value Ref Range Status   Sodium 09/10/2022 141  135 - 145 mmol/L Final   Potassium 09/10/2022 3.5  3.5 - 5.1 mmol/L Final   Chloride 09/10/2022 103  98 - 111 mmol/L Final   CO2 09/10/2022 18 (L)  22 - 32 mmol/L Final   Glucose, Bld 09/10/2022 239 (H)  70 - 99 mg/dL Final   Glucose reference range applies only to samples taken after fasting for at least 8 hours.   BUN 09/10/2022 14  6 - 20 mg/dL Final   Creatinine, Ser 09/10/2022 1.35 (H)  0.61 - 1.24 mg/dL Final   Calcium 09/10/2022 9.7  8.9 - 10.3 mg/dL Final   Total Protein 09/10/2022 8.1  6.5 - 8.1 g/dL Final   Albumin 09/10/2022 4.3  3.5 - 5.0 g/dL Final   AST 09/10/2022 42 (H)  15 - 41 U/L Final   ALT 09/10/2022 21  0 - 44 U/L Final   Alkaline Phosphatase 09/10/2022 60  38 - 126 U/L Final   Total Bilirubin 09/10/2022 0.3  0.3 - 1.2 mg/dL Final   GFR, Estimated 09/10/2022 >60  >60 mL/min Final   Comment: (NOTE) Calculated using the CKD-EPI Creatinine Equation (2021)    Anion gap 09/10/2022 20 (H)  5 - 15 Final   Performed at [facility] [address], [place], [place] [address]   WBC 09/10/2022 21.9 (H)  4.0 - 10.5 K/uL Final   RBC 09/10/2022 4.91  4.22 - 5.81 MIL/uL Final   Hemoglobin 09/10/2022 13.8  13.0 - 17.0 g/dL Final   HCT 09/10/2022 42.0  39.0 - 52.0 % Final   MCV 09/10/2022 85.5  80.0 - 100.0 fL Final   MCH 09/10/2022 28.1  26.0 - 34.0 pg Final   MCHC 09/10/2022 32.9  30.0 - 36.0 g/dL Final   RDW 09/10/2022 13.6  11.5 - 15.5 % Final   Platelets 09/10/2022 348  150 - 400 K/uL Final   REPEATED TO VERIFY   nRBC 09/10/2022 0.0  0.0 - 0.2 % Final   Neutrophils Relative % 09/10/2022 88  % Final   Neutro Abs 09/10/2022 19.1 (H)  1.7 -  7.7 K/uL Final   Lymphocytes Relative 09/10/2022 6  % Final   Lymphs Abs 09/10/2022 1.4  0.7 - 4.0 K/uL Final   Monocytes Relative 09/10/2022 5  % Final   Monocytes Absolute 09/10/2022 1.2 (H)  0.1 - 1.0 K/uL Final   Eosinophils Relative 09/10/2022 0  % Final   Eosinophils Absolute 09/10/2022 0.0  0.0 - 0.5 K/uL Final   Basophils Relative 09/10/2022 0  % Final   Basophils Absolute 09/10/2022 0.1  0.0 -  0.1 K/uL Final   Immature Granulocytes 09/10/2022 1  % Final   Abs Immature Granulocytes 09/10/2022 0.16 (H)  0.00 - 0.07 K/uL Final   Performed at [facility], [place] [address], [place], [place] [address]  There may be more visits with results that are not included.    Blood Alcohol level:  Lab Results  Component Value Date   ETH <10 10/31/2022   ETH <10 62/95/2841    Metabolic Disorder Labs: Lab Results  Component Value Date   HGBA1C 5.2 05/30/2022   MPG 102.54 05/30/2022   MPG 102.54 04/16/2022   Lab Results  Component Value Date   PROLACTIN 32.5 (H) 05/30/2018   Lab Results  Component Value Date   CHOL 186 08/13/2022   TRIG 74 08/13/2022   HDL 72 08/13/2022   CHOLHDL 2.6 08/13/2022   VLDL 15 08/13/2022   LDLCALC 99 08/13/2022   LDLCALC 90 05/30/2022    Therapeutic Lab Levels: No results found for: "LITHIUM" Lab Results  Component Value Date   VALPROATE 27 (L) 11/28/2020   No results found for: "CBMZ"  Physical Findings   AIMS    Flowsheet Row Admission (Discharged) from 01/29/2022 in [facility] Admission (Discharged) from 12/12/2021 in [facility] Admission (Discharged) from 10/08/2021 in [facility] Admission (Discharged) from 04/23/2021 in [facility] Admission (Discharged) from 11/19/2020 in [facility]  AIMS Total Score 0 0 7 0 0      AUDIT    Flowsheet Row Admission  (Discharged) from 01/29/2022 in [facility] Admission (Discharged) from 12/12/2021 in [facility] Admission (Discharged) from 10/08/2021 in [facility] Admission (Discharged) from 04/23/2021 in [facility] Admission (Discharged) from 11/19/2020 in [facility]  Alcohol Use Disorder Identification Test Final Score (AUDIT) 0 0 0 0 0      CAGE-AID    Flowsheet Row ED from 09/19/2022 in [facility] Score 3      PHQ2-9    Flowsheet Row ED from 09/19/2022 in [facility] ED from 06/19/2022 in [facility] ED from 06/05/2022 in [facility] ED from 03/11/2021 in [facility]  PHQ-2 Total Score '4 6 6 6  '$ PHQ-9 Total Score '15 18 18 17      '$ Flowsheet Row ED from 10/31/2022 in [facility] Most recent reading at 10/31/2022  5:45 PM ED from 10/31/2022 in [facility] Most recent reading at 10/31/2022  7:59 AM ED from 10/27/2022 in [facility] Most recent reading at 10/27/2022  4:01 PM  C-SSRS RISK CATEGORY Error: Q3, 4, or 5 should not be populated when Q2 is No High Risk Error: Q3, 4, or 5 should not be populated when Q2 is No        Musculoskeletal  Strength & Muscle Tone: within normal limits Gait & Station: normal Patient leans: N/A  Psychiatric Specialty Exam  Presentation  General Appearance:  Disheveled; Other (comment) (dressed in scrubs)  Eye Contact: Fair  Speech: Clear and Coherent; Normal Rate  Speech Volume: Normal  Handedness: Right   Mood and Affect  Mood: Euthymic  Affect: Flat   Thought Process  Thought  Processes: Coherent  Descriptions of Associations:Intact  Orientation:Full (Time, Place and Person)  Thought Content:WDL  Diagnosis of Schizophrenia or Schizoaffective disorder in past: Yes    Hallucinations:Hallucinations: None  Ideas of Reference:None  Suicidal Thoughts:Suicidal Thoughts: No  Homicidal Thoughts:Homicidal Thoughts: No   Sensorium  Memory: Immediate Fair  Judgment: Intact  Insight: Present   Executive Functions  Concentration: Fair  Attention Span: Fair  Recall: [employer] of Knowledge: Fair  Language: Fair   Psychomotor Activity  Psychomotor Activity: Psychomotor Activity: Normal   Assets  Assets: Communication Skills; Desire for Improvement; Financial Resources/Insurance; Resilience   Sleep  Sleep: Sleep: Fair Number of Hours of Sleep: 0 (6 to 8 hours/night)   Nutritional Assessment (For OBS and FBC admissions only) Has the patient had a weight loss or gain of 10 pounds or more in the last 3 months?: No Has the patient had a decrease in food intake/or appetite?: No Does the patient have dental problems?: No Does the patient have eating habits or behaviors that may be indicators of an eating disorder including binging or inducing vomiting?: No Has the patient recently lost weight without trying?: 0 Has the patient been eating poorly because of a decreased appetite?: 0 Malnutrition Screening Tool Score: 0    Physical Exam  Physical Exam Constitutional:      General: He is not in acute distress.    Appearance: He is not ill-appearing, toxic-appearing or diaphoretic.  Eyes:     General: No scleral icterus. Cardiovascular:     Rate and Rhythm: Normal rate.  Pulmonary:     Effort: Pulmonary effort is normal. No respiratory distress.  Neurological:     Mental Status: He is alert and oriented to person, place, and time.  Psychiatric:        Attention and Perception: Attention and perception normal.        Mood and  Affect: Mood normal. Affect is flat.        Speech: Speech normal.        Behavior: Behavior normal. Behavior is cooperative.        Thought Content: Thought content normal.    Review of Systems  Constitutional:  Negative for chills and fever.  Respiratory:  Negative for shortness of breath.   Cardiovascular:  Negative for chest pain and palpitations.  Gastrointestinal:  Negative for abdominal pain.  Neurological:  Negative for headaches.   Blood pressure 126/87, pulse 97, temperature 98.4 F (36.9 C), temperature source Oral, resp. rate 18, SpO2 98 %. There is no height or weight on file to calculate BMI.  Treatment Plan Summary: Plan Dispo pending  Tharon Aquas, NP 11/01/2022 10:38 AM

## 2022-11-01 NOTE — Progress Notes (Signed)
Client is still asleep.  [facility] is still in need of a phone number to reach relative.    [name] [facility]

## 2022-11-01 NOTE — Social Work (Signed)
CSW called all numbers in the patient's chart. The numbers well all disconnected except one [phone]. CSW left HIPAA Voicemail, hopefully someone will call back. The goal is to get the brothers number to get the Pt to [place] where he states he can go stay with the brother. Mother has several numbers, however none of the numbers are in working order.

## 2022-11-01 NOTE — Care Management (Addendum)
Care Management   Writer was able to make contact with the patient's mother Tyler White [phone]) and the patient brother in [place] [phone]).  Patient reports that he is homeless and his family reports that they do not have the financial means to pay for a ticket for the patient to return to [place]  The brother reports that Tyler White is able to come and live with him in [place].  His brother reports that he would be able to meet the patient when he gets off the bus in [place]. The brother's address is  [place]  Writer will follow up with the provider.

## 2022-11-01 NOTE — ED Notes (Signed)
Pt A&O x 4, resting at present, no distress noted.  Monitoring for safety.  Pending Bus Ride to [place] in am.  Must be at bus station by 5.30am.

## 2022-11-01 NOTE — ED Notes (Signed)
Pt taking a shower right now

## 2022-11-01 NOTE — ED Notes (Signed)
Pt sleeping at present, no distress noted.  Monitoring for safety. 

## 2022-11-01 NOTE — Progress Notes (Signed)
[facility] called all three numbers listed in his chart:   [phone]- the phone rang but was unable to leave a message  Both [phone] & [phone] reports the calls cannot be completed as dialed.  [facility] has no knowledge of any other working phone numbers.  [name] [facility]

## 2022-11-01 NOTE — Care Management (Signed)
Care Management    Patient reports that he does not have identification.    Writer contacted the [employer] station and the only buses that they have running for the weekend uses a contracted bus line that required identification.   The next [employer] bus that does not require a identification will be on Tuesday.   Writer contacted [facility] and coordinated with the customer service.  The patient is allowed to use his discharge hospital paperwork in order to use his discharge paperwork that lists his name as his identification.  The customer service representative ensured that he would not need identification to ride the train.   The patient will need to be at the train station at by 7am because his train leaves at 7:29am  Patient train ride will take hours.  Writer coordinated with nursing staff so that the patient is able to access the clothing closet to obtain a jacket and change of clothing.   Writer coordinated with the nursing staff so that the patient will have some extra food due to him having to travel for more than 20 hours on the train.   Writer informed the NP working with the patient.    Writer contacted the patient brother Ubaldo Glassing [phone]) and informed him when the patient would be arriving.

## 2022-11-02 NOTE — Discharge Instructions (Signed)
  Discharge recommendations:  Patient is to take medications as prescribed. Please see information for follow-up appointment with psychiatry and therapy. Please follow up with your primary care provider for all medical related needs.   Therapy: We recommend that patient participate in individual therapy to address mental health concerns.  Medications: The parent/guardian is to contact a medical professional and/or outpatient provider to address any new side effects that develop. Parent/guardian should update outpatient providers of any new medications and/or medication changes.   Atypical antipsychotics: If you are prescribed an atypical antipsychotic, it is recommended that your height, weight, BMI, blood pressure, fasting lipid panel, and fasting blood sugar be monitored by your outpatient providers.  Safety:  The patient should abstain from use of illicit substances/drugs and abuse of any medications. If symptoms worsen or do not continue to improve or if the patient becomes actively suicidal or homicidal then it is recommended that the patient return to the closest hospital emergency department, the [facility], or call 911 for further evaluation and treatment. National Suicide Prevention Lifeline 1-800-SUICIDE or 2506479517.  About 988 988 offers 24/7 access to trained crisis counselors who can help people experiencing mental health-related distress. People can call or text 988 or chat 988lifeline.org for themselves or if they are worried about a loved one who may need crisis support.  Crisis Mobile: [facility]:                     (539) 094-3639 (for crisis response 24 hours a day) [place]:                                            [phone]

## 2022-11-02 NOTE — ED Notes (Addendum)
Pt resting. Noted rise and fall of chest. Anticipating discharge to 11/02/2022. Must be at train station at 0700 for ride to [place]. No acute concerns at this time.

## 2023-01-03 ENCOUNTER — Other Ambulatory Visit (HOSPITAL_COMMUNITY): Payer: Self-pay

## 2024-03-19 IMAGING — CR DG FOOT COMPLETE 3+V*L*
3 series · 3 of 3 positions shown · non-contrast
Comparison: 06/10/2017, 04/12/2021

CLINICAL DATA: Foot pain, swelling

EXAM:
RIGHT FOOT COMPLETE - 3+ VIEW; LEFT FOOT - COMPLETE 3+ VIEW

[x foot ap left]
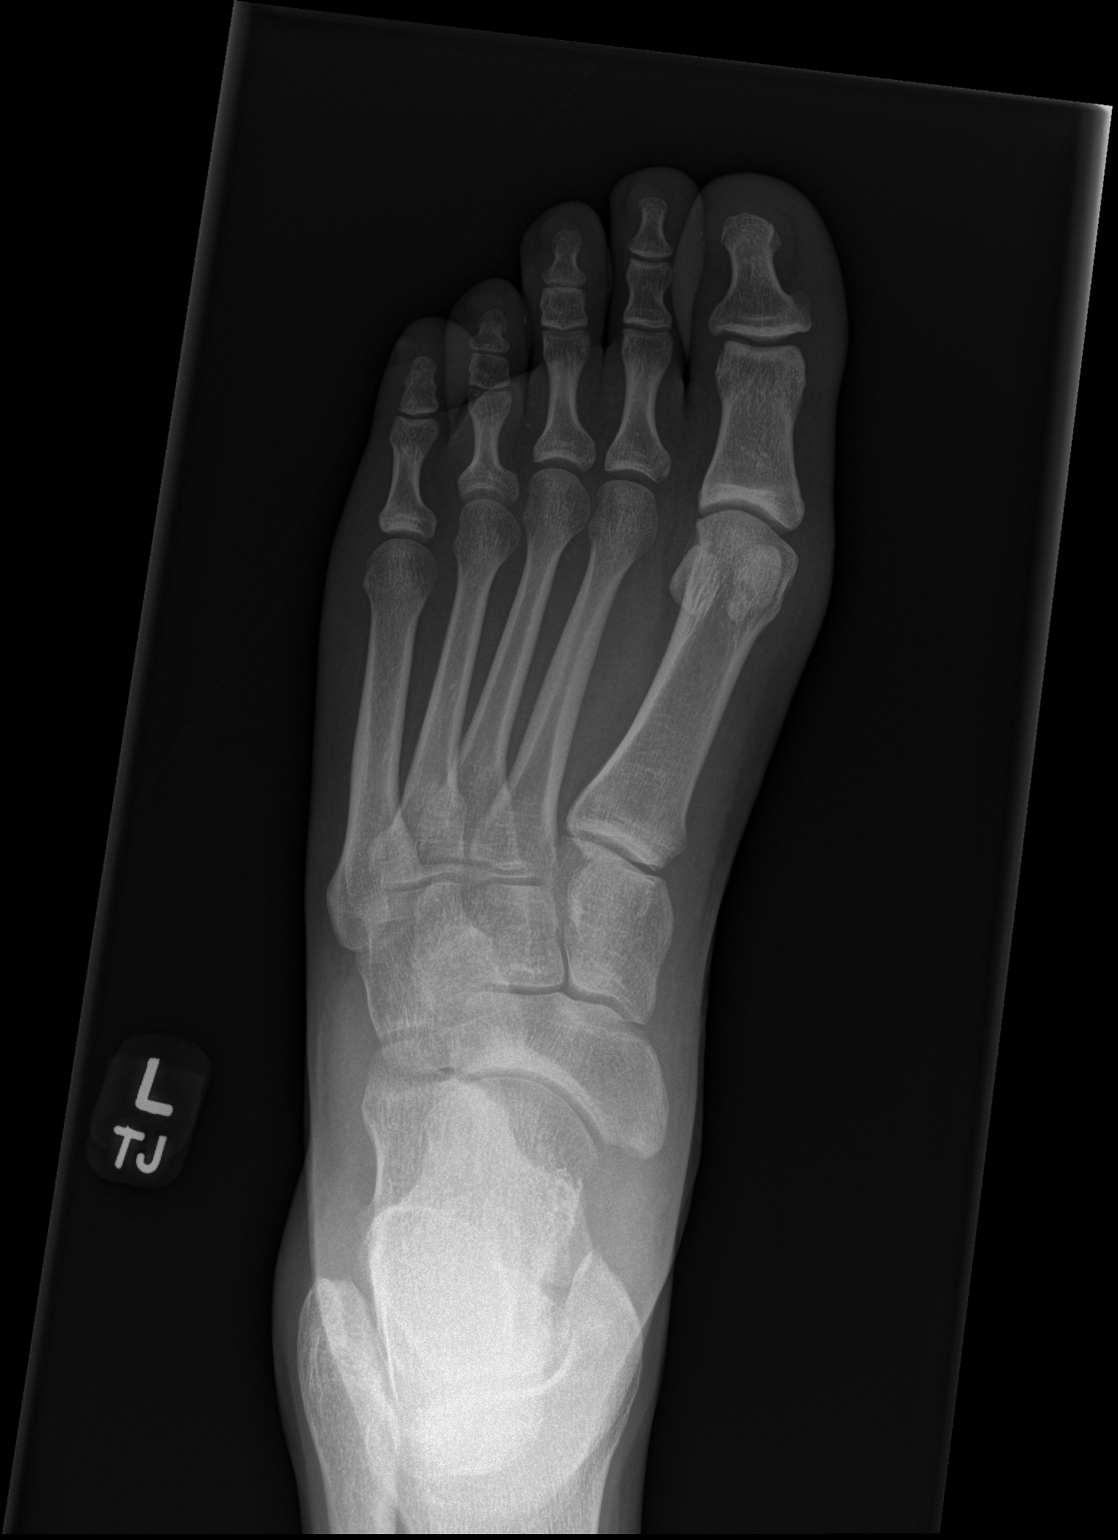

[x foot obl left]
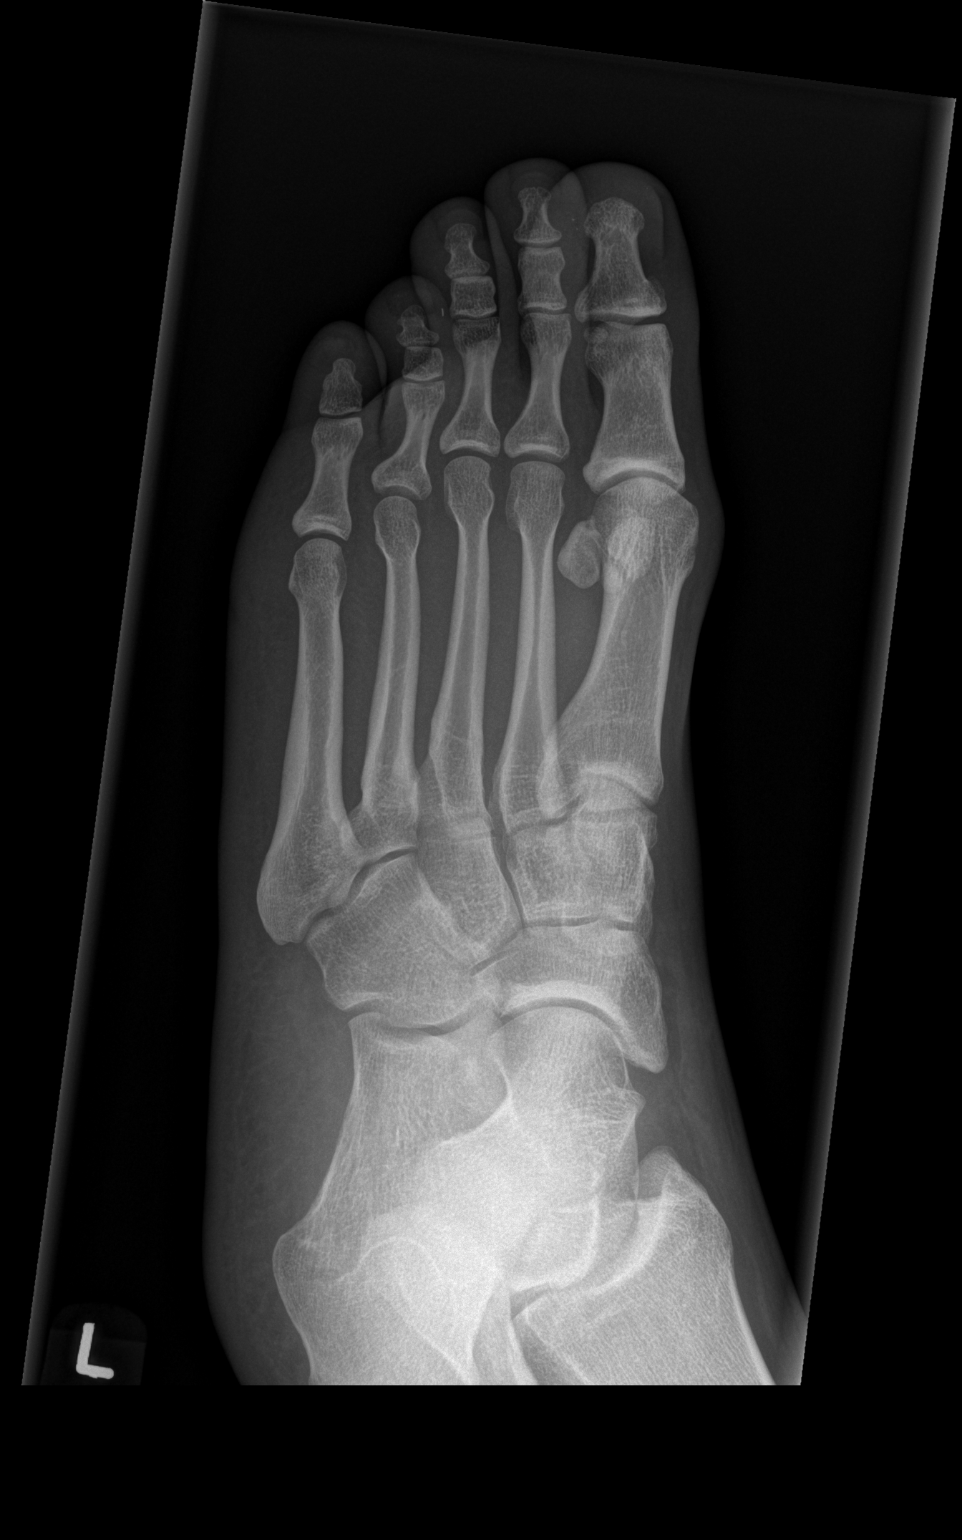

[x foot lat left]
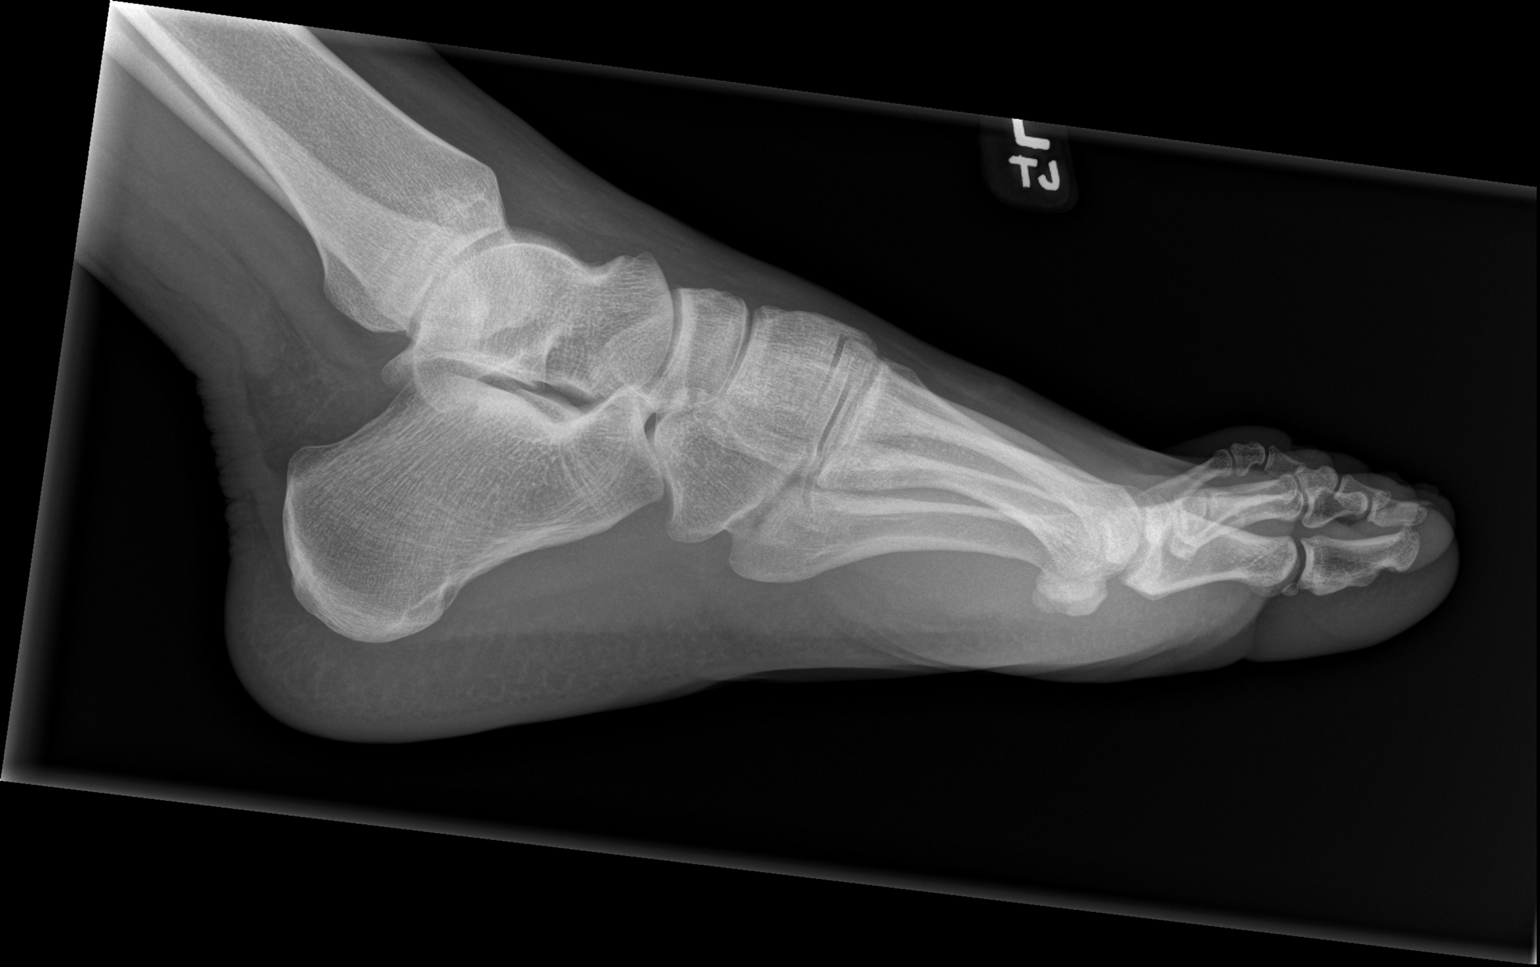

[3 of 3 positions shown; findings below may reference images not displayed]

FINDINGS: Left foot: Frontal, oblique, and lateral views are obtained. No
acute fracture, subluxation, or dislocation. Joint spaces are well
preserved. Soft tissues are unremarkable.

Right foot: Frontal, oblique, lateral views are obtained. No
fracture, subluxation, or dislocation. Joint spaces are well
preserved. Soft tissues are unremarkable.
IMPRESSION: 1. Unremarkable bilateral feet.
